# Patient Record
Sex: Female | Born: 1960
Health system: Southern US, Community
[De-identification: ages and names within clinical notes are randomized; demographics above are authoritative.]

## PROBLEM LIST (undated history)

## (undated) DIAGNOSIS — G510 Bell's palsy: Secondary | ICD-10-CM

## (undated) DIAGNOSIS — I2699 Other pulmonary embolism without acute cor pulmonale: Secondary | ICD-10-CM

## (undated) DIAGNOSIS — I82409 Acute embolism and thrombosis of unspecified deep veins of unspecified lower extremity: Secondary | ICD-10-CM

## (undated) DIAGNOSIS — Z8659 Personal history of other mental and behavioral disorders: Secondary | ICD-10-CM

## (undated) DIAGNOSIS — H3500 Unspecified background retinopathy: Secondary | ICD-10-CM

## (undated) DIAGNOSIS — E785 Hyperlipidemia, unspecified: Secondary | ICD-10-CM

## (undated) DIAGNOSIS — Z992 Dependence on renal dialysis: Secondary | ICD-10-CM

## (undated) DIAGNOSIS — N390 Urinary tract infection, site not specified: Secondary | ICD-10-CM

## (undated) DIAGNOSIS — E119 Type 2 diabetes mellitus without complications: Secondary | ICD-10-CM

## (undated) DIAGNOSIS — N19 Unspecified kidney failure: Secondary | ICD-10-CM

## (undated) DIAGNOSIS — I10 Essential (primary) hypertension: Secondary | ICD-10-CM

## (undated) DIAGNOSIS — D649 Anemia, unspecified: Secondary | ICD-10-CM

## (undated) DIAGNOSIS — M109 Gout, unspecified: Secondary | ICD-10-CM

## (undated) DIAGNOSIS — T148XXA Other injury of unspecified body region, initial encounter: Secondary | ICD-10-CM

## (undated) DIAGNOSIS — Z9289 Personal history of other medical treatment: Secondary | ICD-10-CM

## (undated) DIAGNOSIS — N186 End stage renal disease: Secondary | ICD-10-CM

## (undated) DIAGNOSIS — B9620 Unspecified Escherichia coli [E. coli] as the cause of diseases classified elsewhere: Secondary | ICD-10-CM

## (undated) DIAGNOSIS — N189 Chronic kidney disease, unspecified: Secondary | ICD-10-CM

## (undated) HISTORY — PX: EYE SURGERY: SHX253

## (undated) HISTORY — DX: Unspecified kidney failure: N19

## (undated) HISTORY — PX: NO PAST SURGERIES: SHX2092

---

## 1998-02-09 ENCOUNTER — Inpatient Hospital Stay (HOSPITAL_COMMUNITY): Admission: AD | Admit: 1998-02-09 | Discharge: 1998-02-09 | Payer: Self-pay | Admitting: *Deleted

## 1998-02-15 ENCOUNTER — Inpatient Hospital Stay (HOSPITAL_COMMUNITY): Admission: AD | Admit: 1998-02-15 | Discharge: 1998-02-15 | Payer: Self-pay | Admitting: Obstetrics & Gynecology

## 1998-02-22 ENCOUNTER — Ambulatory Visit (HOSPITAL_COMMUNITY): Admission: RE | Admit: 1998-02-22 | Discharge: 1998-02-22 | Payer: Self-pay | Admitting: Obstetrics

## 1998-03-01 ENCOUNTER — Ambulatory Visit (HOSPITAL_COMMUNITY): Admission: RE | Admit: 1998-03-01 | Discharge: 1998-03-01 | Payer: Self-pay | Admitting: Obstetrics & Gynecology

## 1998-03-15 ENCOUNTER — Ambulatory Visit (HOSPITAL_COMMUNITY): Admission: RE | Admit: 1998-03-15 | Discharge: 1998-03-15 | Payer: Self-pay | Admitting: Obstetrics

## 2003-05-12 ENCOUNTER — Emergency Department (HOSPITAL_COMMUNITY): Admission: EM | Admit: 2003-05-12 | Discharge: 2003-05-12 | Payer: Self-pay | Admitting: Emergency Medicine

## 2003-05-12 ENCOUNTER — Encounter: Payer: Self-pay | Admitting: Emergency Medicine

## 2009-10-01 ENCOUNTER — Ambulatory Visit (HOSPITAL_COMMUNITY): Admission: RE | Admit: 2009-10-01 | Discharge: 2009-10-01 | Payer: Self-pay | Admitting: Orthopedic Surgery

## 2010-12-30 ENCOUNTER — Encounter: Payer: Self-pay | Admitting: Family Medicine

## 2011-03-13 LAB — GLUCOSE, CAPILLARY: Glucose-Capillary: 322 mg/dL — ABNORMAL HIGH (ref 70–99)

## 2013-09-21 ENCOUNTER — Encounter (HOSPITAL_BASED_OUTPATIENT_CLINIC_OR_DEPARTMENT_OTHER): Payer: Self-pay | Admitting: Emergency Medicine

## 2013-09-21 ENCOUNTER — Emergency Department (HOSPITAL_BASED_OUTPATIENT_CLINIC_OR_DEPARTMENT_OTHER): Payer: Federal, State, Local not specified - PPO

## 2013-09-21 ENCOUNTER — Inpatient Hospital Stay (HOSPITAL_BASED_OUTPATIENT_CLINIC_OR_DEPARTMENT_OTHER)
Admission: EM | Admit: 2013-09-21 | Discharge: 2013-10-09 | DRG: 870 | Disposition: A | Discharge status: Home / Self Care | Payer: Federal, State, Local not specified - PPO | Attending: Internal Medicine | Admitting: Internal Medicine

## 2013-09-21 DIAGNOSIS — IMO0002 Reserved for concepts with insufficient information to code with codable children: Secondary | ICD-10-CM | POA: Diagnosis present

## 2013-09-21 DIAGNOSIS — I5023 Acute on chronic systolic (congestive) heart failure: Secondary | ICD-10-CM | POA: Diagnosis present

## 2013-09-21 DIAGNOSIS — R0989 Other specified symptoms and signs involving the circulatory and respiratory systems: Secondary | ICD-10-CM | POA: Diagnosis present

## 2013-09-21 DIAGNOSIS — I10 Essential (primary) hypertension: Secondary | ICD-10-CM | POA: Diagnosis not present

## 2013-09-21 DIAGNOSIS — I5021 Acute systolic (congestive) heart failure: Secondary | ICD-10-CM

## 2013-09-21 DIAGNOSIS — F29 Unspecified psychosis not due to a substance or known physiological condition: Secondary | ICD-10-CM | POA: Diagnosis present

## 2013-09-21 DIAGNOSIS — N289 Disorder of kidney and ureter, unspecified: Secondary | ICD-10-CM | POA: Diagnosis present

## 2013-09-21 DIAGNOSIS — A419 Sepsis, unspecified organism: Secondary | ICD-10-CM | POA: Diagnosis present

## 2013-09-21 DIAGNOSIS — R1110 Vomiting, unspecified: Secondary | ICD-10-CM

## 2013-09-21 DIAGNOSIS — D6959 Other secondary thrombocytopenia: Secondary | ICD-10-CM | POA: Diagnosis present

## 2013-09-21 DIAGNOSIS — I420 Dilated cardiomyopathy: Secondary | ICD-10-CM | POA: Diagnosis present

## 2013-09-21 DIAGNOSIS — J80 Acute respiratory distress syndrome: Secondary | ICD-10-CM | POA: Diagnosis present

## 2013-09-21 DIAGNOSIS — B9620 Unspecified Escherichia coli [E. coli] as the cause of diseases classified elsewhere: Secondary | ICD-10-CM | POA: Diagnosis present

## 2013-09-21 DIAGNOSIS — E870 Hyperosmolality and hypernatremia: Secondary | ICD-10-CM | POA: Diagnosis not present

## 2013-09-21 DIAGNOSIS — G9341 Metabolic encephalopathy: Secondary | ICD-10-CM | POA: Diagnosis not present

## 2013-09-21 DIAGNOSIS — N390 Urinary tract infection, site not specified: Secondary | ICD-10-CM | POA: Diagnosis present

## 2013-09-21 DIAGNOSIS — E871 Hypo-osmolality and hyponatremia: Secondary | ICD-10-CM | POA: Diagnosis present

## 2013-09-21 DIAGNOSIS — G9340 Encephalopathy, unspecified: Secondary | ICD-10-CM | POA: Diagnosis present

## 2013-09-21 DIAGNOSIS — A4151 Sepsis due to Escherichia coli [E. coli]: Principal | ICD-10-CM | POA: Diagnosis present

## 2013-09-21 DIAGNOSIS — J9589 Other postprocedural complications and disorders of respiratory system, not elsewhere classified: Secondary | ICD-10-CM | POA: Diagnosis present

## 2013-09-21 DIAGNOSIS — N170 Acute kidney failure with tubular necrosis: Secondary | ICD-10-CM | POA: Diagnosis present

## 2013-09-21 DIAGNOSIS — R197 Diarrhea, unspecified: Secondary | ICD-10-CM | POA: Diagnosis present

## 2013-09-21 DIAGNOSIS — E1110 Type 2 diabetes mellitus with ketoacidosis without coma: Secondary | ICD-10-CM

## 2013-09-21 DIAGNOSIS — D638 Anemia in other chronic diseases classified elsewhere: Secondary | ICD-10-CM | POA: Diagnosis present

## 2013-09-21 DIAGNOSIS — R7881 Bacteremia: Secondary | ICD-10-CM

## 2013-09-21 DIAGNOSIS — E1310 Other specified diabetes mellitus with ketoacidosis without coma: Secondary | ICD-10-CM | POA: Diagnosis present

## 2013-09-21 DIAGNOSIS — E861 Hypovolemia: Secondary | ICD-10-CM | POA: Diagnosis present

## 2013-09-21 DIAGNOSIS — E1165 Type 2 diabetes mellitus with hyperglycemia: Secondary | ICD-10-CM | POA: Diagnosis present

## 2013-09-21 DIAGNOSIS — J9601 Acute respiratory failure with hypoxia: Secondary | ICD-10-CM | POA: Diagnosis present

## 2013-09-21 DIAGNOSIS — I428 Other cardiomyopathies: Secondary | ICD-10-CM | POA: Diagnosis present

## 2013-09-21 DIAGNOSIS — I2699 Other pulmonary embolism without acute cor pulmonale: Secondary | ICD-10-CM | POA: Diagnosis not present

## 2013-09-21 DIAGNOSIS — E873 Alkalosis: Secondary | ICD-10-CM | POA: Diagnosis not present

## 2013-09-21 DIAGNOSIS — I509 Heart failure, unspecified: Secondary | ICD-10-CM | POA: Diagnosis present

## 2013-09-21 DIAGNOSIS — E876 Hypokalemia: Secondary | ICD-10-CM | POA: Diagnosis not present

## 2013-09-21 DIAGNOSIS — R570 Cardiogenic shock: Secondary | ICD-10-CM | POA: Diagnosis present

## 2013-09-21 DIAGNOSIS — R29810 Facial weakness: Secondary | ICD-10-CM | POA: Diagnosis present

## 2013-09-21 DIAGNOSIS — D65 Disseminated intravascular coagulation [defibrination syndrome]: Secondary | ICD-10-CM | POA: Diagnosis present

## 2013-09-21 HISTORY — DX: Other injury of unspecified body region, initial encounter: T14.8XXA

## 2013-09-21 HISTORY — DX: Type 2 diabetes mellitus without complications: E11.9

## 2013-09-21 LAB — COMPREHENSIVE METABOLIC PANEL
ALT: 8 U/L (ref 0–35)
AST: 15 U/L (ref 0–37)
Albumin: 2.8 g/dL — ABNORMAL LOW (ref 3.5–5.2)
Alkaline Phosphatase: 173 U/L — ABNORMAL HIGH (ref 39–117)
BUN: 22 mg/dL (ref 6–23)
CO2: 20 mEq/L (ref 19–32)
Calcium: 8.9 mg/dL (ref 8.4–10.5)
Chloride: 86 mEq/L — ABNORMAL LOW (ref 96–112)
Creatinine, Ser: 2.2 mg/dL — ABNORMAL HIGH (ref 0.50–1.10)
GFR calc Af Amer: 29 mL/min — ABNORMAL LOW (ref >90)
GFR calc non Af Amer: 25 mL/min — ABNORMAL LOW (ref >90)
Glucose, Bld: 806 mg/dL (ref 70–99)
Potassium: 3.7 mEq/L (ref 3.5–5.1)
Sodium: 124 mEq/L — ABNORMAL LOW (ref 135–145)
Total Bilirubin: 0.3 mg/dL (ref 0.3–1.2)
Total Protein: 7.2 g/dL (ref 6.0–8.3)

## 2013-09-21 LAB — POCT I-STAT 3, VENOUS BLOOD GAS (G3P V)
Acid-base deficit: 3 mmol/L — ABNORMAL HIGH (ref 0.0–2.0)
Bicarbonate: 20.6 mEq/L (ref 20.0–24.0)
O2 Saturation: 64 %
Patient temperature: 100.1
TCO2: 22 mmol/L (ref 0–100)
pCO2, Ven: 35.3 mmHg — ABNORMAL LOW (ref 45.0–50.0)
pH, Ven: 7.379 — ABNORMAL HIGH (ref 7.250–7.300)
pO2, Ven: 35 mmHg (ref 30.0–45.0)

## 2013-09-21 LAB — CBC WITH DIFFERENTIAL/PLATELET
Basophils Absolute: 0 10*3/uL (ref 0.0–0.1)
Basophils Relative: 0 % (ref 0–1)
Eosinophils Absolute: 0 10*3/uL (ref 0.0–0.7)
Eosinophils Relative: 0 % (ref 0–5)
HCT: 35.6 % — ABNORMAL LOW (ref 36.0–46.0)
Hemoglobin: 12.1 g/dL (ref 12.0–15.0)
Lymphocytes Relative: 3 % — ABNORMAL LOW (ref 12–46)
Lymphs Abs: 0.5 10*3/uL — ABNORMAL LOW (ref 0.7–4.0)
MCH: 24.2 pg — ABNORMAL LOW (ref 26.0–34.0)
MCHC: 34 g/dL (ref 30.0–36.0)
MCV: 71.1 fL — ABNORMAL LOW (ref 78.0–100.0)
Monocytes Absolute: 1 10*3/uL (ref 0.1–1.0)
Monocytes Relative: 6 % (ref 3–12)
Neutro Abs: 15.4 10*3/uL — ABNORMAL HIGH (ref 1.7–7.7)
Neutrophils Relative %: 91 % — ABNORMAL HIGH (ref 43–77)
Platelets: 152 10*3/uL (ref 150–400)
RBC: 5.01 MIL/uL (ref 3.87–5.11)
RDW: 15.4 % (ref 11.5–15.5)
WBC Morphology: INCREASED
WBC: 16.9 10*3/uL — ABNORMAL HIGH (ref 4.0–10.5)

## 2013-09-21 LAB — GLUCOSE, CAPILLARY: Glucose-Capillary: >600 mg/dL (ref 70–99)

## 2013-09-21 LAB — CG4 I-STAT (LACTIC ACID): Lactic Acid, Venous: 4.68 mmol/L — ABNORMAL HIGH (ref 0.5–2.2)

## 2013-09-21 MED ORDER — SODIUM CHLORIDE 0.9 % IV SOLN
INTRAVENOUS | Status: DC
  Administered 2013-09-22: 01:00:00 via INTRAVENOUS

## 2013-09-21 MED ORDER — INSULIN REGULAR HUMAN 100 UNIT/ML IJ SOLN
INTRAMUSCULAR | Status: AC
  Filled 2013-09-21: qty 1

## 2013-09-21 MED ORDER — ACETAMINOPHEN 325 MG PO TABS
975.0000 mg | ORAL_TABLET | Freq: Once | ORAL | Status: AC
  Administered 2013-09-21: 975 mg via ORAL

## 2013-09-21 MED ORDER — ACETAMINOPHEN 325 MG PO TABS
ORAL_TABLET | ORAL | Status: AC
  Filled 2013-09-21: qty 3

## 2013-09-21 MED ORDER — SODIUM CHLORIDE 0.9 % IV BOLUS (SEPSIS)
2000.0000 mL | Freq: Once | INTRAVENOUS | Status: AC
  Administered 2013-09-22: 2000 mL via INTRAVENOUS

## 2013-09-21 MED ORDER — ONDANSETRON HCL 4 MG/2ML IJ SOLN
4.0000 mg | Freq: Once | INTRAMUSCULAR | Status: AC
  Administered 2013-09-21: 4 mg via INTRAVENOUS
  Filled 2013-09-21: qty 2

## 2013-09-21 MED ORDER — SODIUM CHLORIDE 0.9 % IV SOLN
INTRAVENOUS | Status: DC
  Administered 2013-09-22: 5.4 [IU]/h via INTRAVENOUS

## 2013-09-21 MED ORDER — SODIUM CHLORIDE 0.9 % IV BOLUS (SEPSIS)
2000.0000 mL | Freq: Once | INTRAVENOUS | Status: AC
  Administered 2013-09-21: 2000 mL via INTRAVENOUS

## 2013-09-21 NOTE — ED Notes (Signed)
Pt c/o weakness, vomiting and diarrhea yesterday. Pt states "I just dont feel good".

## 2013-09-21 NOTE — ED Notes (Signed)
MD at bedside. 

## 2013-09-21 NOTE — ED Provider Notes (Signed)
CSN: [id number]     Arrival date & time 09/21/13  2232 History  This chart was scribed for Colleen Relic, MD by Marlowe Kays, ED Scribe. This patient was seen in room MH06/MH06 and the patient's care was started at 11:22 PM.  Chief Complaint  Patient presents with  . Weakness  . Emesis   The history is provided by the patient. No language interpreter was used.   HPI Comments:  ERICE [name] is a 52 y.o. female with h/o DM type II who presents to the Emergency Department complaining of a few episodes per day of nonbloody vomiting and diarrhea with associated chills, fever and general weakness onset 2 days. She states she does not take any medication although she should be on Metformin and states she does not check her CBGs. Pt denies any confusion, LOC,pain, cough, SOB, trauma, rash, or dysuria. She denies any h/o asthma, MI, cancer, or seizures. She also denies any recent travel. She states she does not a primary care physician. There is no treatment PTA. She has 2 days also of polyuria despite a dry mouth and excessive thirst.   Past Medical History  Diagnosis Date  . Diabetes   . 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 No pertinent past surgical history. History reviewed. No pertinent family history. History  Substance Use Topics  . Smoking status: Never Smoker   . Smokeless tobacco: Not on file  . Alcohol Use: No   OB History   Grav Para Term Preterm Abortions TAB SAB Ect Mult Living                 Review of Systems 10 Systems reviewed and all are negative for acute change except as noted in the HPI.   Allergies  Review of patient's allergies indicates no known allergies.  Home Medications  No current outpatient prescriptions on file. Triage Vitals: BP 87/55  Pulse 131  Temp(Src) 100.1 F (37.8 C) (Oral)  Resp 16  Ht 5\' 4"  (1.626 m)  Wt 163 lb (73.936 kg)  BMI 27.97 kg/m2  SpO2 97%  LMP  09/14/2013 Physical Exam  Nursing note and vitals reviewed. Constitutional: She is oriented to person, place, and time.  Awake, alert, nontoxic appearance.  HENT:  Head: Atraumatic.  Dry oral mucosa  Eyes: Right eye exhibits no discharge. Left eye exhibits no discharge.  Neck: Neck supple.  Cardiovascular: Regular rhythm.   No murmur heard. tachycardic  Pulmonary/Chest: Effort normal and breath sounds normal. No respiratory distress. She has no wheezes. She has no rales. She exhibits no tenderness.  Abdominal: Soft. Bowel sounds are normal. She exhibits no distension and no mass. There is no tenderness. There is no rebound and no guarding.  Musculoskeletal: She exhibits no edema and no tenderness.  Baseline ROM, no obvious new focal weakness.  Neurological: She is alert and oriented to person, place, and time.  Mental status and motor strength appears baseline for patient and situation.  Skin: No rash noted.  Psychiatric: She has a normal mood and affect.    ED Course  Procedures (including critical care time) DIAGNOSTIC STUDIES: Oxygen Saturation is 97% on RA, normal by my interpretation.   Patient / Family / Caregiver understand and agree with initial ED impression and plan with expectations set for ED visit.  Pt stable in ED with no significant deterioration in condition.Pt feels improved after observation and/or  treatment in ED.Patient / Family / Caregiver informed of clinical course, understand medical decision-making process, and agree with plan.  D/w [facility] for transfer; Pt agrees. 0223  When [facility] arrived for transfer Pt dropped SBP to 60's, Pt still dry mouth but improved pulse rate to 107, repeat IVF bolus (6th liter) and started Levophed as well as d/w [facility] who will admit to ICU instead of [facility]. Patient / Family / Caregiver informed of clinical course, understand medical decision-making process, and agree with plan. 0410  Pt denied dyspnea but  became hypoxic with O2 sats in 80's responded to O2 mask; re-exam lungs now shows crackles bilat ~1/3 way up so fluids rate decreased and Levophed titrated upwards, SBP improving to high 70's; pulse rate 104, O2 sats in 90's on O2 mask, ICU bed now available; Pt remains awake alert calm and cooperative sitting up without resp distress. [id number]   Discussed airway management with [facility] transfer crew and do not feel Pt required intubation pre-transfer.   CRITICAL CARE Performed by: Colleen [name] Total critical care time: 80min for dka with dehydration and possible sepsis with hypotension, IVF, insulin drip; hypotension responded to IVF bolus initially then required vasopressor. Critical care time was exclusive of separately billable procedures and treating other patients. Critical care was necessary to treat or prevent imminent or life-threatening deterioration. Critical care was time spent personally by me on the following activities: development of treatment plan with patient and/or surrogate as well as nursing, discussions with consultants, evaluation of patient's response to treatment, examination of patient, obtaining history from patient or surrogate, ordering and performing treatments and interventions, ordering and review of laboratory studies, ordering and review of radiographic studies, pulse oximetry and re-evaluation of patient's condition.  Medications  insulin regular (NOVOLIN R,HUMULIN R) 1 Units/mL in sodium chloride 0.9 % 100 mL infusion (2.4 Units/hr Intravenous Rate/Dose Change 09/22/13 0412)  0.9 %  sodium chloride infusion ( Intravenous Rate/Dose Change 09/22/13 0431)  norepinephrine (LEVOPHED) 4 mg in dextrose 5 % 250 mL infusion (20 mcg/min Intravenous Rate/Dose Change 09/22/13 0446)  sodium chloride 0.9 % bolus 2,000 mL (0 mLs Intravenous Stopped 09/22/13 0023)  insulin regular (NOVOLIN R,HUMULIN R) 100 units/mL injection (  Duplicate [id number])  acetaminophen (TYLENOL)  tablet 975 mg (975 mg Oral Given 09/21/13 2331)  sodium chloride 0.9 % bolus 2,000 mL (0 mLs Intravenous Stopped 09/22/13 0110)  ondansetron (ZOFRAN) injection 4 mg (4 mg Intravenous Given 09/21/13 2358)  ibuprofen (ADVIL,MOTRIN) tablet 400 mg (400 mg Oral Given 09/22/13 0100)  cefTRIAXone (ROCEPHIN) 2 g in dextrose 5 % 50 mL IVPB (0 g Intravenous Stopped 09/22/13 0148)  sodium chloride 0.9 % bolus 1,000 mL (0 mLs Intravenous Stopped 09/22/13 0206)  cefTRIAXone (ROCEPHIN) 2 G injection (  Duplicate [id number])  sodium chloride 0.9 % bolus 1,000 mL (1,000 mLs Intravenous Rate/Dose Change 09/22/13 0430)    Labs Review Labs Reviewed  GLUCOSE, CAPILLARY - Abnormal; Notable for the following:    Glucose-Capillary >600 (*)    All other components within normal limits  CBC WITH DIFFERENTIAL - Abnormal; Notable for the following:    WBC 16.9 (*)    HCT 35.6 (*)    MCV 71.1 (*)    MCH 24.2 (*)    Neutrophils Relative % 91 (*)    Lymphocytes Relative 3 (*)    Neutro Abs 15.4 (*)    Lymphs Abs 0.5 (*)    All other components within normal limits  URINALYSIS, ROUTINE  W REFLEX MICROSCOPIC - Abnormal; Notable for the following:    APPearance CLOUDY (*)    Glucose, UA >1000 (*)    Hgb urine dipstick TRACE (*)    Ketones, ur 15 (*)    All other components within normal limits  COMPREHENSIVE METABOLIC PANEL - Abnormal; Notable for the following:    Sodium 124 (*)    Chloride 86 (*)    Glucose, Bld 806 (*)    Creatinine, Ser 2.20 (*)    Albumin 2.8 (*)    Alkaline Phosphatase 173 (*)    GFR calc non Af Amer 25 (*)    GFR calc Af Amer 29 (*)    All other components within normal limits  URINE MICROSCOPIC-ADD ON - Abnormal; Notable for the following:    Bacteria, UA MANY (*)    All other components within normal limits  GLUCOSE, CAPILLARY - Abnormal; Notable for the following:    Glucose-Capillary 518 (*)    All other components within normal limits  GLUCOSE, CAPILLARY - Abnormal;  Notable for the following:    Glucose-Capillary 431 (*)    All other components within normal limits  GLUCOSE, CAPILLARY - Abnormal; Notable for the following:    Glucose-Capillary 295 (*)    All other components within normal limits  CG4 I-STAT (LACTIC ACID) - Abnormal; Notable for the following:    Lactic Acid, Venous 4.68 (*)    All other components within normal limits  POCT I-STAT 3, BLOOD GAS (G3P V) - Abnormal; Notable for the following:    pH, Ven 7.379 (*)    pCO2, Ven 35.3 (*)    Acid-base deficit 3.0 (*)    All other components within normal limits  CULTURE, BLOOD (ROUTINE X 2)  CULTURE, BLOOD (ROUTINE X 2)  URINE CULTURE  PREGNANCY, URINE  BLOOD GAS, VENOUS   Imaging Review Dg Chest Port 1 View  09/22/2013   CLINICAL DATA:  Emesis and diarrhea for 1 week, with chills and fever.  EXAM: PORTABLE CHEST - 1 VIEW  COMPARISON:  None.  FINDINGS: Cardiomegaly. Low lung volumes. Moderate vascular congestion. No overt failure or areas of lobar consolidation. No effusion or pneumothorax. Negative osseous structures. No visible free air on this portable semi erect radiograph.  IMPRESSION: Cardiomegaly with low lung volumes. No definite active infiltrates or failure.   Electronically Signed   By: Rolla Flatten M.D.   On: 09/22/2013 01:34    EKG Interpretation   None       MDM   1. DKA (diabetic ketoacidoses)   2. Sepsis   3. UTI (urinary tract infection)   4. Vomiting and diarrhea    The patient appears reasonably stabilized for transfer considering the current resources, flow, and capabilities available in the ED at this time, and I doubt any other [facility] requiring further screening and/or treatment in the ED prior to transfer. I personally performed the services described in this documentation, which was scribed in my presence. The recorded information has been reviewed and is accurate.     Colleen Relic, MD 09/22/13 [phone]

## 2013-09-22 ENCOUNTER — Inpatient Hospital Stay (HOSPITAL_COMMUNITY): Payer: Federal, State, Local not specified - PPO

## 2013-09-22 DIAGNOSIS — R197 Diarrhea, unspecified: Secondary | ICD-10-CM

## 2013-09-22 DIAGNOSIS — R1110 Vomiting, unspecified: Secondary | ICD-10-CM

## 2013-09-22 DIAGNOSIS — I519 Heart disease, unspecified: Secondary | ICD-10-CM

## 2013-09-22 LAB — BASIC METABOLIC PANEL
BUN: 21 mg/dL (ref 6–23)
BUN: 23 mg/dL (ref 6–23)
BUN: 24 mg/dL — ABNORMAL HIGH (ref 6–23)
BUN: 25 mg/dL — ABNORMAL HIGH (ref 6–23)
BUN: 25 mg/dL — ABNORMAL HIGH (ref 6–23)
CO2: 15 mEq/L — ABNORMAL LOW (ref 19–32)
CO2: 13 mEq/L — ABNORMAL LOW (ref 19–32)
CO2: 10 mEq/L — CL (ref 19–32)
CO2: 11 mEq/L — ABNORMAL LOW (ref 19–32)
CO2: 12 mEq/L — ABNORMAL LOW (ref 19–32)
Calcium: 6.8 mg/dL — ABNORMAL LOW (ref 8.4–10.5)
Calcium: 6.7 mg/dL — ABNORMAL LOW (ref 8.4–10.5)
Calcium: 6.8 mg/dL — ABNORMAL LOW (ref 8.4–10.5)
Calcium: 6.9 mg/dL — ABNORMAL LOW (ref 8.4–10.5)
Calcium: 6.8 mg/dL — ABNORMAL LOW (ref 8.4–10.5)
Chloride: 104 mEq/L (ref 96–112)
Chloride: 103 mEq/L (ref 96–112)
Chloride: 103 mEq/L (ref 96–112)
Chloride: 107 mEq/L (ref 96–112)
Chloride: 110 mEq/L (ref 96–112)
Creatinine, Ser: 2.43 mg/dL — ABNORMAL HIGH (ref 0.50–1.10)
Creatinine, Ser: 2.58 mg/dL — ABNORMAL HIGH (ref 0.50–1.10)
Creatinine, Ser: 2.73 mg/dL — ABNORMAL HIGH (ref 0.50–1.10)
Creatinine, Ser: 2.68 mg/dL — ABNORMAL HIGH (ref 0.50–1.10)
Creatinine, Ser: 2.64 mg/dL — ABNORMAL HIGH (ref 0.50–1.10)
GFR calc Af Amer: 25 mL/min — ABNORMAL LOW (ref >90)
GFR calc Af Amer: 24 mL/min — ABNORMAL LOW (ref >90)
GFR calc Af Amer: 22 mL/min — ABNORMAL LOW (ref >90)
GFR calc Af Amer: 23 mL/min — ABNORMAL LOW (ref >90)
GFR calc Af Amer: 23 mL/min — ABNORMAL LOW (ref >90)
GFR calc non Af Amer: 22 mL/min — ABNORMAL LOW (ref >90)
GFR calc non Af Amer: 20 mL/min — ABNORMAL LOW (ref >90)
GFR calc non Af Amer: 19 mL/min — ABNORMAL LOW (ref >90)
GFR calc non Af Amer: 19 mL/min — ABNORMAL LOW (ref >90)
GFR calc non Af Amer: 20 mL/min — ABNORMAL LOW (ref >90)
Glucose, Bld: 328 mg/dL — ABNORMAL HIGH (ref 70–99)
Glucose, Bld: 409 mg/dL — ABNORMAL HIGH (ref 70–99)
Glucose, Bld: 322 mg/dL — ABNORMAL HIGH (ref 70–99)
Glucose, Bld: 151 mg/dL — ABNORMAL HIGH (ref 70–99)
Glucose, Bld: 167 mg/dL — ABNORMAL HIGH (ref 70–99)
Potassium: 2.5 mEq/L — CL (ref 3.5–5.1)
Potassium: 3.3 mEq/L — ABNORMAL LOW (ref 3.5–5.1)
Potassium: 3.4 mEq/L — ABNORMAL LOW (ref 3.5–5.1)
Potassium: 3.4 mEq/L — ABNORMAL LOW (ref 3.5–5.1)
Potassium: 4.4 mEq/L (ref 3.5–5.1)
Sodium: 136 mEq/L (ref 135–145)
Sodium: 132 mEq/L — ABNORMAL LOW (ref 135–145)
Sodium: 131 mEq/L — ABNORMAL LOW (ref 135–145)
Sodium: 134 mEq/L — ABNORMAL LOW (ref 135–145)
Sodium: 136 mEq/L (ref 135–145)

## 2013-09-22 LAB — GLUCOSE, CAPILLARY
Glucose-Capillary: 518 mg/dL — ABNORMAL HIGH (ref 70–99)
Glucose-Capillary: 431 mg/dL — ABNORMAL HIGH (ref 70–99)
Glucose-Capillary: 295 mg/dL — ABNORMAL HIGH (ref 70–99)
Glucose-Capillary: 276 mg/dL — ABNORMAL HIGH (ref 70–99)
Glucose-Capillary: 309 mg/dL — ABNORMAL HIGH (ref 70–99)
Glucose-Capillary: 358 mg/dL — ABNORMAL HIGH (ref 70–99)
Glucose-Capillary: 365 mg/dL — ABNORMAL HIGH (ref 70–99)
Glucose-Capillary: 320 mg/dL — ABNORMAL HIGH (ref 70–99)
Glucose-Capillary: 291 mg/dL — ABNORMAL HIGH (ref 70–99)
Glucose-Capillary: 253 mg/dL — ABNORMAL HIGH (ref 70–99)
Glucose-Capillary: 176 mg/dL — ABNORMAL HIGH (ref 70–99)
Glucose-Capillary: 156 mg/dL — ABNORMAL HIGH (ref 70–99)
Glucose-Capillary: 145 mg/dL — ABNORMAL HIGH (ref 70–99)
Glucose-Capillary: 116 mg/dL — ABNORMAL HIGH (ref 70–99)
Glucose-Capillary: 113 mg/dL — ABNORMAL HIGH (ref 70–99)

## 2013-09-22 LAB — PROTIME-INR
INR: 1.61 — ABNORMAL HIGH (ref 0.00–1.49)
Prothrombin Time: 18.7 seconds — ABNORMAL HIGH (ref 11.6–15.2)

## 2013-09-22 LAB — PROCALCITONIN: Procalcitonin: 122.89 ng/mL

## 2013-09-22 LAB — CARBOXYHEMOGLOBIN
Carboxyhemoglobin: 1.1 % (ref 0.5–1.5)
Methemoglobin: 1.5 % (ref 0.0–1.5)
O2 Saturation: 60.7 %
Total hemoglobin: 10.5 g/dL — ABNORMAL LOW (ref 12.0–16.0)

## 2013-09-22 LAB — FIBRINOGEN: Fibrinogen: 542 mg/dL — ABNORMAL HIGH (ref 204–475)

## 2013-09-22 LAB — CBC
HCT: 32.4 % — ABNORMAL LOW (ref 36.0–46.0)
Hemoglobin: 11.1 g/dL — ABNORMAL LOW (ref 12.0–15.0)
MCH: 24.1 pg — ABNORMAL LOW (ref 26.0–34.0)
MCHC: 34.3 g/dL (ref 30.0–36.0)
MCV: 70.4 fL — ABNORMAL LOW (ref 78.0–100.0)
Platelets: 95 10*3/uL — ABNORMAL LOW (ref 150–400)
RBC: 4.6 MIL/uL (ref 3.87–5.11)
RDW: 15.1 % (ref 11.5–15.5)
WBC: 17.4 10*3/uL — ABNORMAL HIGH (ref 4.0–10.5)

## 2013-09-22 LAB — URINE MICROSCOPIC-ADD ON

## 2013-09-22 LAB — URINALYSIS, ROUTINE W REFLEX MICROSCOPIC
Bilirubin Urine: NEGATIVE
Bilirubin Urine: NEGATIVE
Glucose, UA: >1000 mg/dL — AB
Glucose, UA: >1000 mg/dL — AB
Ketones, ur: 15 mg/dL — AB
Ketones, ur: 15 mg/dL — AB
Leukocytes, UA: NEGATIVE
Leukocytes, UA: NEGATIVE
Nitrite: NEGATIVE
Nitrite: NEGATIVE
Protein, ur: NEGATIVE mg/dL
Protein, ur: NEGATIVE mg/dL
Specific Gravity, Urine: 1.024 (ref 1.005–1.030)
Specific Gravity, Urine: 1.024 (ref 1.005–1.030)
Urobilinogen, UA: 0.2 mg/dL (ref 0.0–1.0)
Urobilinogen, UA: 0.2 mg/dL (ref 0.0–1.0)
pH: 5 (ref 5.0–8.0)
pH: 5.5 (ref 5.0–8.0)

## 2013-09-22 LAB — TROPONIN I: Troponin I: <0.3 ng/mL (ref <0.30)

## 2013-09-22 LAB — PREGNANCY, URINE: Preg Test, Ur: NEGATIVE

## 2013-09-22 LAB — APTT: aPTT: 38 seconds — ABNORMAL HIGH (ref 24–37)

## 2013-09-22 LAB — TYPE AND SCREEN
ABO/RH(D): O POS
Antibody Screen: NEGATIVE

## 2013-09-22 LAB — OSMOLALITY: Osmolality: 299 mOsm/kg (ref 275–300)

## 2013-09-22 LAB — CORTISOL: Cortisol, Plasma: 19.4 ug/dL

## 2013-09-22 LAB — CLOSTRIDIUM DIFFICILE BY PCR: Toxigenic C. Difficile by PCR: NEGATIVE

## 2013-09-22 LAB — KETONES, QUALITATIVE: Acetone, Bld: NEGATIVE

## 2013-09-22 LAB — LACTIC ACID, PLASMA: Lactic Acid, Venous: 3.3 mmol/L — ABNORMAL HIGH (ref 0.5–2.2)

## 2013-09-22 LAB — ABO/RH: ABO/RH(D): O POS

## 2013-09-22 LAB — MRSA PCR SCREENING: MRSA by PCR: NEGATIVE

## 2013-09-22 MED ORDER — PANTOPRAZOLE SODIUM 40 MG IV SOLR
40.0000 mg | INTRAVENOUS | Status: DC
  Administered 2013-09-22 – 2013-09-24 (×3): 40 mg via INTRAVENOUS
  Filled 2013-09-22 (×5): qty 40

## 2013-09-22 MED ORDER — POTASSIUM CHLORIDE 10 MEQ/50ML IV SOLN
10.0000 meq | INTRAVENOUS | Status: DC
  Administered 2013-09-22 (×3): 10 meq via INTRAVENOUS
  Filled 2013-09-22: qty 50

## 2013-09-22 MED ORDER — NOREPINEPHRINE BITARTRATE 1 MG/ML IJ SOLN
2.0000 ug/min | INTRAVENOUS | Status: DC
  Administered 2013-09-22: 4 ug/min via INTRAVENOUS
  Filled 2013-09-22: qty 4

## 2013-09-22 MED ORDER — VANCOMYCIN HCL IN DEXTROSE 1-5 GM/200ML-% IV SOLN
1000.0000 mg | INTRAVENOUS | Status: DC
  Administered 2013-09-23: 1000 mg via INTRAVENOUS
  Filled 2013-09-22: qty 200

## 2013-09-22 MED ORDER — POTASSIUM CHLORIDE 10 MEQ/100ML IV SOLN
10.0000 meq | INTRAVENOUS | Status: AC
  Administered 2013-09-22: 10 meq via INTRAVENOUS
  Filled 2013-09-22 (×2): qty 100

## 2013-09-22 MED ORDER — VASOPRESSIN 20 UNIT/ML IJ SOLN
0.0300 [IU]/min | INTRAVENOUS | Status: DC
  Administered 2013-09-22: 0.03 [IU]/min via INTRAVENOUS
  Filled 2013-09-22 (×2): qty 2.5

## 2013-09-22 MED ORDER — CHLORHEXIDINE GLUCONATE 0.12 % MT SOLN
15.0000 mL | Freq: Two times a day (BID) | OROMUCOSAL | Status: DC
  Administered 2013-09-22 – 2013-09-24 (×5): 15 mL via OROMUCOSAL
  Filled 2013-09-22 (×4): qty 15

## 2013-09-22 MED ORDER — SODIUM CHLORIDE 0.9 % IV SOLN
INTRAVENOUS | Status: DC
  Administered 2013-09-22: 09:00:00 via INTRAVENOUS

## 2013-09-22 MED ORDER — SODIUM CHLORIDE 0.9 % IV SOLN: INTRAVENOUS | Status: DC

## 2013-09-22 MED ORDER — SODIUM CHLORIDE 0.9 % IV BOLUS (SEPSIS): 1000.0000 mL | Freq: Once | INTRAVENOUS | Status: DC

## 2013-09-22 MED ORDER — POTASSIUM CHLORIDE CRYS ER 20 MEQ PO TBCR
40.0000 meq | EXTENDED_RELEASE_TABLET | ORAL | Status: AC
  Administered 2013-09-22 (×2): 40 meq via ORAL
  Filled 2013-09-22 (×2): qty 2

## 2013-09-22 MED ORDER — SODIUM CHLORIDE 0.9 % IV SOLN
INTRAVENOUS | Status: DC
  Administered 2013-09-22: 2.4 [IU]/h via INTRAVENOUS
  Administered 2013-09-23: 3.9 [IU]/h via INTRAVENOUS
  Filled 2013-09-22 (×4): qty 1

## 2013-09-22 MED ORDER — SODIUM CHLORIDE 0.9 % IV BOLUS (SEPSIS): 1000.0000 mL | INTRAVENOUS | Status: DC | PRN

## 2013-09-22 MED ORDER — BIOTENE DRY MOUTH MT LIQD
15.0000 mL | Freq: Two times a day (BID) | OROMUCOSAL | Status: DC
  Administered 2013-09-22 – 2013-09-24 (×6): 15 mL via OROMUCOSAL

## 2013-09-22 MED ORDER — LIVING WELL WITH DIABETES BOOK
Freq: Once | Status: AC
  Administered 2013-09-22: 13:00:00
  Filled 2013-09-22: qty 1

## 2013-09-22 MED ORDER — IBUPROFEN 400 MG PO TABS
400.0000 mg | ORAL_TABLET | Freq: Once | ORAL | Status: AC
  Administered 2013-09-22: 400 mg via ORAL
  Filled 2013-09-22: qty 1

## 2013-09-22 MED ORDER — CEFTRIAXONE SODIUM 2 G IJ SOLR
2.0000 g | Freq: Once | INTRAMUSCULAR | Status: AC
  Administered 2013-09-22: 2 g via INTRAVENOUS

## 2013-09-22 MED ORDER — NOREPINEPHRINE BITARTRATE 1 MG/ML IJ SOLN
2.0000 ug/min | INTRAVENOUS | Status: DC
  Administered 2013-09-22: 15 ug/min via INTRAVENOUS
  Administered 2013-09-22: 50 ug/min via INTRAVENOUS
  Filled 2013-09-22 (×3): qty 16

## 2013-09-22 MED ORDER — DEXTROSE 50 % IV SOLN: 25.0000 mL | INTRAVENOUS | Status: DC | PRN

## 2013-09-22 MED ORDER — DEXTROSE-NACL 5-0.45 % IV SOLN
INTRAVENOUS | Status: DC
  Administered 2013-09-22 (×2): via INTRAVENOUS

## 2013-09-22 MED ORDER — SODIUM BICARBONATE 8.4 % IV SOLN
INTRAVENOUS | Status: DC
  Administered 2013-09-22 – 2013-09-23 (×2): via INTRAVENOUS
  Filled 2013-09-22 (×6): qty 100

## 2013-09-22 MED ORDER — PIPERACILLIN-TAZOBACTAM 3.375 G IVPB
3.3750 g | Freq: Three times a day (TID) | INTRAVENOUS | Status: DC
  Administered 2013-09-22 – 2013-09-24 (×7): 3.375 g via INTRAVENOUS
  Filled 2013-09-22 (×9): qty 50

## 2013-09-22 MED ORDER — SODIUM CHLORIDE 0.9 % IV BOLUS (SEPSIS)
1000.0000 mL | Freq: Once | INTRAVENOUS | Status: AC
  Administered 2013-09-22: 1000 mL via INTRAVENOUS

## 2013-09-22 MED ORDER — CEFTRIAXONE SODIUM 2 G IJ SOLR
INTRAMUSCULAR | Status: AC
  Filled 2013-09-22: qty 2

## 2013-09-22 MED ORDER — ENOXAPARIN SODIUM 30 MG/0.3ML ~~LOC~~ SOLN
30.0000 mg | SUBCUTANEOUS | Status: DC
  Administered 2013-09-22: 30 mg via SUBCUTANEOUS
  Filled 2013-09-22 (×2): qty 0.3

## 2013-09-22 MED ORDER — VANCOMYCIN HCL 10 G IV SOLR
1250.0000 mg | Freq: Once | INTRAVENOUS | Status: AC
  Administered 2013-09-22: 1250 mg via INTRAVENOUS
  Filled 2013-09-22: qty 1250

## 2013-09-22 MED ORDER — NOREPINEPHRINE BITARTRATE 1 MG/ML IJ SOLN
2.0000 ug/min | INTRAMUSCULAR | Status: DC
  Administered 2013-09-22: 50 ug/min via INTRAVENOUS
  Filled 2013-09-22: qty 8

## 2013-09-22 NOTE — Progress Notes (Signed)
Chaplain encountered pt's mother in hallway and initiated conversation. Pt was unavailable, but chaplain returned 10 minutes later and visited with pt and mother. 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 Mother said, "Work, work, work, work, work" and expressed concern that her daughter works too much. Pt said she "was fine." Chaplain offered emotional/spiritual support and information for chaplain's support. Both were appreciative.   Ethelene Browns, [place]

## 2013-09-22 NOTE — Progress Notes (Signed)
[facility] PCCM  Persistent met acidosis with diarrhea; gap closed  Change fluids to D5 with bicarb gtt  Jillyn Hidden PCCM Pager: [phone] Cell: [phone] If no response, call [phone]

## 2013-09-22 NOTE — Progress Notes (Signed)
  Echocardiogram 2D Echocardiogram has been performed.  Donata Clay 09/22/2013, 4:45 PM

## 2013-09-22 NOTE — ED Notes (Signed)
Attempted to call report to [facility] but the nurse was not available at this time.  She is to call me back.

## 2013-09-22 NOTE — Procedures (Signed)
Central Venous Catheter Insertion Procedure Note Colleen [name] [id number] 1961/01/10  Procedure: Insertion of Central Venous Catheter Indications: Assessment of intravascular volume and Drug and/or fluid administration  Procedure Details Consent: Risks of procedure as well as the alternatives and risks of each were explained to the (patient/caregiver).  Consent for procedure obtained. Time Out: Verified patient identification, verified procedure, site/side was marked, verified correct patient position, special equipment/implants available, medications/allergies/relevent history reviewed, required imaging and test results available.  Performed  Maximum sterile technique was used including antiseptics, cap, gloves, gown, hand hygiene, mask and sheet. Skin prep: Chlorhexidine; local anesthetic administered A antimicrobial bonded/coated triple lumen catheter was placed in the left internal jugular vein using the Seldinger technique.  Ultrasound was used to verify the patency of the vein and for real time needle guidance.  Evaluation Blood flow good Complications: No apparent complications Patient did tolerate procedure well. Chest X-ray ordered to verify placement.  CXR: pending.  MCQUAID, DOUGLAS 09/22/2013, 7:09 AM

## 2013-09-22 NOTE — ED Notes (Signed)
Pt's O2 sat dropped to 87 with encouraged deep breathing. [place] removed and pt placed on NRB at 10LPM. O2 sat increased to 98%.

## 2013-09-22 NOTE — Progress Notes (Addendum)
Dr. Lamonte Sakai notified of pt's gram negative rods in both blood cultures and CO2 of 10.

## 2013-09-22 NOTE — Progress Notes (Signed)
Anion gap,.  NA-136, CL-110 , CO2-12  136-(110+12) = 14

## 2013-09-22 NOTE — Progress Notes (Signed)
Dr. Lamonte Sakai of urine output of 75 ml since foley placement around 0800 this morning and creatinine slowly trending upward. No new orders received. Will continue to monitor.

## 2013-09-22 NOTE — Progress Notes (Signed)
10/16  Spoke with patient about her diabetes.  Was diagnosed about 4-5 years ago.  Does not have a PCP to follow her diabetes. States that she had been sick with nausea and vomiting for the last 2 days before admission.  Has not been checking blood sugars at home.  Dines out all the time for meals.  Has not had outpatient education classes on diabetes.  States that she has [employer]. Recommend that she attend DM outpatient classes if ordered by physician.  Will have staff RNs have patient watch DM videos when feeling better and while in hospital,  will order Living Well with Diabetes booklet from pharmacy, start teaching insulin administration when patient feeling better and if patient going home on insulin and teach patient to do own CBGs.  Will have case management and dietician see patient.  Will continue to follow while in hospital.  Harvel Ricks RN BSN CDE

## 2013-09-22 NOTE — Progress Notes (Signed)
CRITICAL VALUE ALERT  Critical value received:  K 2.5  Date of notification:  09/22/2013  Time of notification:  08:20  Critical value read back: yes  Nurse who received alert:  Adalberto Ill  MD notified (1st page):  Dr. Lamonte Sakai  Time of first page:  08:20  MD notified (2nd page):  Time of second page:  Responding MD:  Dr. Lamonte Sakai  Time MD responded:  08:20

## 2013-09-22 NOTE — Progress Notes (Signed)
Radiology called about 0719 x-ray to confirm central line placement. Dr. Lamonte Sakai notified about results. No new orders received. Will continue to monitor.

## 2013-09-22 NOTE — Progress Notes (Signed)
ANTIBIOTIC CONSULT NOTE - INITIAL  Pharmacy Consult for vancomycin and zosyn Indication: septic shock  No Known Allergies  Patient Measurements: Height: 5\' 4"  (162.6 cm) Weight: 163 lb (73.936 kg) IBW/kg (Calculated) : 54.7   Vital Signs: Temp: 100.9 F (38.3 C) (10/16 0247) Temp src: Rectal (10/16 0247) BP: 85/58 mmHg (10/16 0535) Pulse Rate: 110 (10/16 0535) Intake/Output from previous day:   Intake/Output from this shift:    Labs:  Recent Labs  09/21/13 2250  WBC 16.9*  HGB 12.1  PLT 152  CREATININE 2.20*   Estimated Creatinine Clearance: 29.8 ml/min (by C-G formula based on Cr of 2.2). No results found for this basename: VANCOTROUGH, VANCOPEAK, VANCORANDOM, GENTTROUGH, GENTPEAK, GENTRANDOM, TOBRATROUGH, TOBRAPEAK, TOBRARND, AMIKACINPEAK, AMIKACINTROU, AMIKACIN,  in the last 72 hours   Microbiology: No results found for this or any previous visit (from the past 720 hour(s)).  Medical History: Past Medical History  Diagnosis Date  . Diabetes   . 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  Her CrCl ~ 29 ml/min.  Goal of Therapy:  Vancomycin trough level 15-20 mcg/ml  Plan:  Zosyn 3.375 gm IV q8 hours Vancomycin 1250 mg IV X 1 then 1gm IV q24 hours. F/u renal function, cultures and clinical course.  Anquan Azzarello [place]et 09/22/2013,7:01 AM

## 2013-09-22 NOTE — H&P (Signed)
PULMONARY  / CRITICAL CARE MEDICINE  Name: Colleen [name] MRN: [id number] DOB: [birth year]    ADMISSION DATE:  09/21/2013 CONSULTATION DATE:  09/22/2013  REFERRING MD :  Dr. Stevie Kern PRIMARY SERVICE: PCCM  CHIEF COMPLAINT:  Weakness and vomiting  BRIEF PATIENT DESCRIPTION: 52 y/o female presenting [facility] with weakness and emesis found to be in DKA vs HONC with shock requiring pressors.   SIGNIFICANT EVENTS / STUDIES:  10/15 presented to [facility], given IV fluids, IV insulin, and pressors 10/16 transferred to [facility] ICU  LINES / TUBES: L IJ 10/16>>> Peripheral IV X 2  CULTURES: Blood 10/15>>> Urine 10/15>>> MRSA PCR 10/15>>>  ANTIBIOTICS: Rocephin 10/15>>>10/16 Vancomycin 10/16>>> Zosyn 1016>>>  HISTORY OF PRESENT ILLNESS:   Colleen [name] reports that she has been feeling progressively ill for the last 2 months. This has acutely worsened over the last 2 days with profound weakness and several episodes of non-bloody emesis and diarrhea. She ate some chicken 2 nights ago and she began to vomit afterwards and so attributed her symptoms to that. She denies fever, headache, chest pain, dyspnea, cough, increased sputum, dysuria, vaginal discharge, edema, and recent travel or long periods of immobilization. She seems to be slightly confused according to her brother. She has had some chills but has them at baseline explaining that she is cold-natured.   She states that she has diabetes but has not been watching her diet or taking metformin for around 2 years.   PAST MEDICAL HISTORY :  Past Medical History  Diagnosis Date  . Diabetes   . 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 No pertinent past surgical history. Prior to Admission medications   Not on File   No Known Allergies  FAMILY HISTORY:  History reviewed. No pertinent family history. SOCIAL HISTORY:  reports that she has  never smoked. She does not have any smokeless tobacco history on file. She reports that she does not drink alcohol or use illicit drugs.  REVIEW OF SYSTEMS:  Per HPI  SUBJECTIVE:   VITAL SIGNS: Temp:  [100.1 F (37.8 C)-104.3 F (40.2 C)] 100.9 F (38.3 C) (10/16 0247) Pulse Rate:  [106-131] 110 (10/16 0535) Resp:  [16-30] 26 (10/16 0535) BP: (64-119)/(15-58) 85/58 mmHg (10/16 0535) SpO2:  [91 %-100 %] 100 % (10/16 0535) Weight:  [163 lb (73.936 kg)] 163 lb (73.936 kg) (10/15 2239) HEMODYNAMICS:   VENTILATOR SETTINGS:   INTAKE / OUTPUT: Intake/Output   None     PHYSICAL EXAMINATION: Gen: NAD, alert, cooperative with exam, on non re-breather HEENT: NCAT, EOMI, PERRL, L IJ inplace CV: RRR, good S1/S2, no murmur Resp: crackles at BL bases, good air movement, non labored, non re-breather Abd: SNTND, BS present, no guarding or organomegaly Ext: trace pitting edema BL, 2+ DP pusles Neuro: Alert, interactive, strength 5/5 and sensation intact in all 4 extremities Skin: intact  LABS:  CBC Recent Labs     09/21/13  2250  WBC  16.9*  HGB  12.1  HCT  35.6*  PLT  152   Coag's No results found for this basename: APTT, INR,  in the last 72 hours BMET Recent Labs     09/21/13  2250  NA  124*  K  3.7  CL  86*  CO2  20  BUN  22  CREATININE  2.20*  GLUCOSE  806*   Electrolytes Recent Labs  09/21/13  2250  CALCIUM  8.9   Sepsis Markers No results found for this basename: LACTICACIDVEN, PROCALCITON, O2SATVEN,  in the last 72 hours ABG No results found for this basename: PHART, PCO2ART, PO2ART,  in the last 72 hours Liver Enzymes Recent Labs     09/21/13  2250  AST  15  ALT  8  ALKPHOS  173*  BILITOT  0.3  ALBUMIN  2.8*   Cardiac Enzymes No results found for this basename: TROPONINI, PROBNP,  in the last 72 hours Glucose Recent Labs     09/21/13  2245  09/22/13  0150  09/22/13  0255  09/22/13  0410  GLUCAP  >600*  518*  431*  295*     Imaging Dg Chest Port 1 View  09/22/2013   CLINICAL DATA:  Emesis and diarrhea for 1 week, with chills and fever.  EXAM: PORTABLE CHEST - 1 VIEW  COMPARISON:  None.  FINDINGS: Cardiomegaly. Low lung volumes. Moderate vascular congestion. No overt failure or areas of lobar consolidation. No effusion or pneumothorax. Negative osseous structures. No visible free air on this portable semi erect radiograph.  IMPRESSION: Cardiomegaly with low lung volumes. No definite active infiltrates or failure.   Electronically Signed   By: Rolla Flatten M.D.   On: 09/22/2013 01:34   CXR: L IJ in place with tip in R atrium, opacities in R and L bases c/w pna vs atelectasis.   ASSESSMENT / PLAN:  PULMONARY A: Acute respiratory failure P:   Non-re-breather, wean as tolerated  CARDIOVASCULAR A:  Shock, likely hypovolemia and possible component of sepsis P:  Levophed, central line placed for CVP's No additional boluses with likely pulmonary edema TTE to assess LV fxn Serial troponin Serial lactate   RENAL A:   AKI, likely hypotensive ATN vs other pre-renal although BUN/Cre is 10 Possible component of CKD with longstanding untreated DM2 Hyponatremia - 125 corrects to 132 accounting for glucose P:   IV NS at 75 Trend BMP  GASTROINTESTINAL A:   Diarrhea, vomiting P:   See ID SUP protonix  HEMATOLOGIC A:   Leukocytosis P:  See ID Trend CBC  INFECTIOUS A:   Possible sepsis with septic shock, no clear infectious source Possible Gi pathogen P:   empiric vanc and zosyn oredered Blood and urine cultures pending Sepsis protocol GI pathogen panel C diff PCR procalcitonin   ENDOCRINE A:   DM2   DKA vs HONC P:   Hold metformin > she wasn't taking it IV insulin, glucomander Check Ketones, osmolarity  NEUROLOGIC A:   Suspected confusion P:   Routine monitoring Avoid sedating meds  Laroy Apple, MD [employer] Resident, PGY-2 09/22/2013, 6:06 AM  60 min  CC time   Baltazar Apo, MD, PhD 09/22/2013, 9:50 AM [facility] [phone] or if no answer [phone]

## 2013-09-22 NOTE — ED Notes (Addendum)
IV fluids increased to bolus and O2 increased to 4LPM via [place]. Dr. Stevie Kern at bedside.

## 2013-09-22 NOTE — Procedures (Signed)
Arterial Catheter Insertion Procedure Note Colleen [name] [id number] 20-Jun-1961  Procedure: Insertion of Arterial Catheter  Indications: Blood pressure monitoring  Procedure Details Consent: Risks of procedure as well as the alternatives and risks of each were explained to the (patient/caregiver).  Consent for procedure obtained. Time Out: Verified patient identification, verified procedure, site/side was marked, verified correct patient position, special equipment/implants available, medications/allergies/relevent history reviewed, required imaging and test results available.  Performed  Maximum sterile technique was used including antiseptics, cap, gloves, gown, hand hygiene, mask and sheet. Skin prep: Chlorhexidine; local anesthetic administered 20 gauge catheter was inserted into right radial artery using the Seldinger technique.  Evaluation Blood flow good; BP tracing good. Complications: No apparent complications.   Hope Pigeon, MA 09/22/2013

## 2013-09-22 NOTE — Progress Notes (Signed)
Nutrition Consult - Brief Note  Received consult for diabetes diet education. Patient is currently in the ICU, not fully alert at this time. Not appropriate for diet education at this time. Will order OP nutrition counseling. RD to follow-up at a later time for diet education.   [name], RD, LDN, [place] Pager [phone] After Hours Pager [phone]

## 2013-09-23 ENCOUNTER — Inpatient Hospital Stay (HOSPITAL_COMMUNITY): Payer: Federal, State, Local not specified - PPO

## 2013-09-23 LAB — BASIC METABOLIC PANEL
BUN: 26 mg/dL — ABNORMAL HIGH (ref 6–23)
BUN: 25 mg/dL — ABNORMAL HIGH (ref 6–23)
BUN: 24 mg/dL — ABNORMAL HIGH (ref 6–23)
BUN: 24 mg/dL — ABNORMAL HIGH (ref 6–23)
BUN: 23 mg/dL (ref 6–23)
BUN: 23 mg/dL (ref 6–23)
CO2: 13 mEq/L — ABNORMAL LOW (ref 19–32)
CO2: 14 mEq/L — ABNORMAL LOW (ref 19–32)
CO2: 17 mEq/L — ABNORMAL LOW (ref 19–32)
CO2: 17 mEq/L — ABNORMAL LOW (ref 19–32)
CO2: 18 mEq/L — ABNORMAL LOW (ref 19–32)
CO2: 18 mEq/L — ABNORMAL LOW (ref 19–32)
Calcium: 6.9 mg/dL — ABNORMAL LOW (ref 8.4–10.5)
Calcium: 7.1 mg/dL — ABNORMAL LOW (ref 8.4–10.5)
Calcium: 7.1 mg/dL — ABNORMAL LOW (ref 8.4–10.5)
Calcium: 7.1 mg/dL — ABNORMAL LOW (ref 8.4–10.5)
Calcium: 7.1 mg/dL — ABNORMAL LOW (ref 8.4–10.5)
Calcium: 7.1 mg/dL — ABNORMAL LOW (ref 8.4–10.5)
Chloride: 106 mEq/L (ref 96–112)
Chloride: 105 mEq/L (ref 96–112)
Chloride: 104 mEq/L (ref 96–112)
Chloride: 104 mEq/L (ref 96–112)
Chloride: 104 mEq/L (ref 96–112)
Chloride: 106 mEq/L (ref 96–112)
Creatinine, Ser: 2.54 mg/dL — ABNORMAL HIGH (ref 0.50–1.10)
Creatinine, Ser: 2.52 mg/dL — ABNORMAL HIGH (ref 0.50–1.10)
Creatinine, Ser: 2.43 mg/dL — ABNORMAL HIGH (ref 0.50–1.10)
Creatinine, Ser: 2.38 mg/dL — ABNORMAL HIGH (ref 0.50–1.10)
Creatinine, Ser: 2.38 mg/dL — ABNORMAL HIGH (ref 0.50–1.10)
Creatinine, Ser: 2.35 mg/dL — ABNORMAL HIGH (ref 0.50–1.10)
GFR calc Af Amer: 24 mL/min — ABNORMAL LOW (ref >90)
GFR calc Af Amer: 24 mL/min — ABNORMAL LOW (ref >90)
GFR calc Af Amer: 25 mL/min — ABNORMAL LOW (ref >90)
GFR calc Af Amer: 26 mL/min — ABNORMAL LOW (ref >90)
GFR calc Af Amer: 26 mL/min — ABNORMAL LOW (ref >90)
GFR calc Af Amer: 26 mL/min — ABNORMAL LOW (ref >90)
GFR calc non Af Amer: 21 mL/min — ABNORMAL LOW (ref >90)
GFR calc non Af Amer: 21 mL/min — ABNORMAL LOW (ref >90)
GFR calc non Af Amer: 22 mL/min — ABNORMAL LOW (ref >90)
GFR calc non Af Amer: 22 mL/min — ABNORMAL LOW (ref >90)
GFR calc non Af Amer: 22 mL/min — ABNORMAL LOW (ref >90)
GFR calc non Af Amer: 23 mL/min — ABNORMAL LOW (ref >90)
Glucose, Bld: 208 mg/dL — ABNORMAL HIGH (ref 70–99)
Glucose, Bld: 196 mg/dL — ABNORMAL HIGH (ref 70–99)
Glucose, Bld: 157 mg/dL — ABNORMAL HIGH (ref 70–99)
Glucose, Bld: 149 mg/dL — ABNORMAL HIGH (ref 70–99)
Glucose, Bld: 124 mg/dL — ABNORMAL HIGH (ref 70–99)
Glucose, Bld: 169 mg/dL — ABNORMAL HIGH (ref 70–99)
Potassium: 4.3 mEq/L (ref 3.5–5.1)
Potassium: 3.9 mEq/L (ref 3.5–5.1)
Potassium: 3.2 mEq/L — ABNORMAL LOW (ref 3.5–5.1)
Potassium: 3 mEq/L — ABNORMAL LOW (ref 3.5–5.1)
Potassium: 3.2 mEq/L — ABNORMAL LOW (ref 3.5–5.1)
Potassium: 3.6 mEq/L (ref 3.5–5.1)
Sodium: 133 mEq/L — ABNORMAL LOW (ref 135–145)
Sodium: 134 mEq/L — ABNORMAL LOW (ref 135–145)
Sodium: 135 mEq/L (ref 135–145)
Sodium: 135 mEq/L (ref 135–145)
Sodium: 135 mEq/L (ref 135–145)
Sodium: 137 mEq/L (ref 135–145)

## 2013-09-23 LAB — CBC
HCT: 32.9 % — ABNORMAL LOW (ref 36.0–46.0)
Hemoglobin: 11.7 g/dL — ABNORMAL LOW (ref 12.0–15.0)
MCH: 24.2 pg — ABNORMAL LOW (ref 26.0–34.0)
MCHC: 35.6 g/dL (ref 30.0–36.0)
MCV: 68.1 fL — ABNORMAL LOW (ref 78.0–100.0)
Platelets: 62 10*3/uL — ABNORMAL LOW (ref 150–400)
RBC: 4.83 MIL/uL (ref 3.87–5.11)
RDW: 15.3 % (ref 11.5–15.5)
WBC: 19 10*3/uL — ABNORMAL HIGH (ref 4.0–10.5)

## 2013-09-23 LAB — GLUCOSE, CAPILLARY
Glucose-Capillary: 123 mg/dL — ABNORMAL HIGH (ref 70–99)
Glucose-Capillary: 136 mg/dL — ABNORMAL HIGH (ref 70–99)
Glucose-Capillary: 148 mg/dL — ABNORMAL HIGH (ref 70–99)
Glucose-Capillary: 150 mg/dL — ABNORMAL HIGH (ref 70–99)
Glucose-Capillary: 153 mg/dL — ABNORMAL HIGH (ref 70–99)
Glucose-Capillary: 179 mg/dL — ABNORMAL HIGH (ref 70–99)
Glucose-Capillary: 181 mg/dL — ABNORMAL HIGH (ref 70–99)
Glucose-Capillary: 182 mg/dL — ABNORMAL HIGH (ref 70–99)
Glucose-Capillary: 185 mg/dL — ABNORMAL HIGH (ref 70–99)
Glucose-Capillary: 192 mg/dL — ABNORMAL HIGH (ref 70–99)
Glucose-Capillary: 193 mg/dL — ABNORMAL HIGH (ref 70–99)
Glucose-Capillary: 146 mg/dL — ABNORMAL HIGH (ref 70–99)
Glucose-Capillary: 147 mg/dL — ABNORMAL HIGH (ref 70–99)
Glucose-Capillary: 155 mg/dL — ABNORMAL HIGH (ref 70–99)
Glucose-Capillary: 160 mg/dL — ABNORMAL HIGH (ref 70–99)
Glucose-Capillary: 197 mg/dL — ABNORMAL HIGH (ref 70–99)
Glucose-Capillary: 116 mg/dL — ABNORMAL HIGH (ref 70–99)
Glucose-Capillary: 129 mg/dL — ABNORMAL HIGH (ref 70–99)
Glucose-Capillary: 124 mg/dL — ABNORMAL HIGH (ref 70–99)
Glucose-Capillary: 125 mg/dL — ABNORMAL HIGH (ref 70–99)
Glucose-Capillary: 147 mg/dL — ABNORMAL HIGH (ref 70–99)
Glucose-Capillary: 119 mg/dL — ABNORMAL HIGH (ref 70–99)
Glucose-Capillary: 161 mg/dL — ABNORMAL HIGH (ref 70–99)

## 2013-09-23 LAB — URINE CULTURE: Colony Count: >=100000

## 2013-09-23 LAB — BLOOD GAS, ARTERIAL
Acid-base deficit: 8.4 mmol/L — ABNORMAL HIGH (ref 0.0–2.0)
Bicarbonate: 15.4 mEq/L — ABNORMAL LOW (ref 20.0–24.0)
Drawn by: 32526
O2 Content: 4 L/min
O2 Saturation: 97 %
Patient temperature: 98.6
TCO2: 16.2 mmol/L (ref 0–100)
pCO2 arterial: 25.1 mmHg — ABNORMAL LOW (ref 35.0–45.0)
pH, Arterial: 7.406 (ref 7.350–7.450)
pO2, Arterial: 78.4 mmHg — ABNORMAL LOW (ref 80.0–100.0)

## 2013-09-23 LAB — GI PATHOGEN PANEL BY PCR, STOOL
C difficile toxin A/B: NEGATIVE
Campylobacter by PCR: NEGATIVE
Cryptosporidium by PCR: NEGATIVE
E coli (ETEC) LT/ST: NEGATIVE
E coli (STEC): NEGATIVE
E coli 0157 by PCR: NEGATIVE
G lamblia by PCR: NEGATIVE
Norovirus GI/GII: NEGATIVE
Rotavirus A by PCR: NEGATIVE
Salmonella by PCR: NEGATIVE
Shigella by PCR: NEGATIVE

## 2013-09-23 LAB — DIC (DISSEMINATED INTRAVASCULAR COAGULATION)PANEL
Fibrinogen: 644 mg/dL — ABNORMAL HIGH (ref 204–475)
Prothrombin Time: 18.8 seconds — ABNORMAL HIGH (ref 11.6–15.2)
Smear Review: NONE SEEN

## 2013-09-23 LAB — DIC (DISSEMINATED INTRAVASCULAR COAGULATION) PANEL (NOT AT ARMC)
D-Dimer, Quant: 17.58 ug/mL-FEU — ABNORMAL HIGH (ref 0.00–0.48)
INR: 1.62 — ABNORMAL HIGH (ref 0.00–1.49)
Platelets: 58 10*3/uL — ABNORMAL LOW (ref 150–400)
aPTT: 49 seconds — ABNORMAL HIGH (ref 24–37)

## 2013-09-23 LAB — PROCALCITONIN: Procalcitonin: 82 ng/mL

## 2013-09-23 LAB — LACTIC ACID, PLASMA: Lactic Acid, Venous: 3.3 mmol/L — ABNORMAL HIGH (ref 0.5–2.2)

## 2013-09-23 MED ORDER — ALBUTEROL SULFATE (5 MG/ML) 0.5% IN NEBU
2.5000 mg | INHALATION_SOLUTION | Freq: Four times a day (QID) | RESPIRATORY_TRACT | Status: DC
  Administered 2013-09-23 – 2013-10-01 (×33): 2.5 mg via RESPIRATORY_TRACT
  Filled 2013-09-23 (×33): qty 0.5

## 2013-09-23 MED ORDER — ALBUTEROL SULFATE (5 MG/ML) 0.5% IN NEBU
2.5000 mg | INHALATION_SOLUTION | RESPIRATORY_TRACT | Status: DC | PRN
Start: 2013-09-23 — End: 2013-10-09
  Filled 2013-09-23: qty 0.5

## 2013-09-23 MED ORDER — POTASSIUM CHLORIDE CRYS ER 20 MEQ PO TBCR
40.0000 meq | EXTENDED_RELEASE_TABLET | Freq: Once | ORAL | Status: AC
  Administered 2013-09-23: 40 meq via ORAL
  Filled 2013-09-23: qty 2

## 2013-09-23 NOTE — Progress Notes (Addendum)
Nutrition Follow-up - Brief Note  Patient is still lethargic; not appropriate for extensive diet education. RD provided CHO-counting handout. Patient to review when she wakes up. OP nutrition counseling has been ordered.  [name], RD, LDN, [place] Pager [phone] After Hours Pager [phone]

## 2013-09-23 NOTE — Progress Notes (Signed)
Chaplain followed up with pt, pt's mother, and pt's brother. They were receptive to chaplain's visit, but said they "were fine." Chaplain explained that nurses can contact chaplain if they want chaplain support later.   Chaplain offering emotional/spiritual support and empathic listening. Family and pt were grateful for visit.   Ethelene Browns, [place]

## 2013-09-23 NOTE — Progress Notes (Signed)
Anion gap 13.

## 2013-09-23 NOTE — Progress Notes (Signed)
PCCM PGY-2 Interim Note  Paged to bedside, pt pulled out left IJ central line. Vasopressin infusing via PIV, but pressures have been steady ~110/80. Pt denies pain. Will try to remove vasopressin and if able will avoid replacing central line. However, pt still hyperventilating and still with acidosis, so may need central line in case of further decompensation. Will discuss with Dr. Lake Bells.  Emmaline Kluver, MD PGY-2, [facility] 09/23/2013, 12:14 AM PCCM service pager [phone]

## 2013-09-23 NOTE — Progress Notes (Signed)
anaion gap 14

## 2013-09-23 NOTE — Consult Note (Signed)
PULMONARY  / CRITICAL CARE MEDICINE  Name: Colleen [name] MRN: [id number] DOB: [birth year]    ADMISSION DATE:  09/21/2013 CONSULTATION DATE:  09/22/2013  REFERRING MD :  Dr. Stevie Kern PRIMARY SERVICE: PCCM  CHIEF COMPLAINT:  Weakness and vomiting  BRIEF PATIENT DESCRIPTION: 52 y/o female [facility] with weakness and emesis found to be in DKA vs HONC with shock requiring pressors.   SIGNIFICANT EVENTS / STUDIES:  10/15 presented to [facility], given IV fluids, IV insulin, and pressors 10/16 transferred to [facility] ICU 10/16 pulled out L IJ  LINES / TUBES: L IJ 10/16>>> Peripheral IV X 2  CULTURES: Blood 10/15>>> GNR >>> Urine 10/15>>> GNR >>> MRSA PCR 10/15>>> Cdiff 10/16 neg Cdiff 10/16>>> GI pathogen panel 10/16>>>  ANTIBIOTICS: Rocephin 10/15>>>10/16 Vancomycin 10/16>>> Zosyn 1016>>>  SUBJECTIVE: Removed IJ overnight, no apparent confusion, states she has a worsening cough and dyspnea  VITAL SIGNS: Temp:  [91.7 F (33.2 C)-100.8 F (38.2 C)] 98.5 F (36.9 C) (10/17 1000) Pulse Rate:  [81-131] 127 (10/17 1000) Resp:  [18-49] 21 (10/17 1000) BP: (88-134)/(48-90) 104/77 mmHg (10/17 1000) SpO2:  [86 %-100 %] 99 % (10/17 1000) Arterial Line BP: (94-131)/(59-76) 110/59 mmHg (10/17 1000) HEMODYNAMICS: CVP:  [13 mmHg-14 mmHg] 13 mmHg VENTILATOR SETTINGS:   INTAKE / OUTPUT: Intake/Output     10/16 0701 - 10/17 0700 10/17 0701 - 10/18 0700   I.V. (mL/kg) 4554.5 (61.6) 526.3 (7.1)   IV Piggyback 425 37.5   Total Intake(mL/kg) 4979.5 (67.3) 563.8 (7.6)   Urine (mL/kg/hr) 690 (0.4) 250 (0.8)   Total Output 690 250   Net +4289.5 +313.8        Stool Occurrence 4 x      PHYSICAL EXAMINATION: Gen: NAD, alert, cooperative with exam HEENT: NCAT, EOMI, PERRL, R facial droop CV: RRR, good S1/S2, no murmur Resp: crackles throughout, some exp wheezes, decreased air movement at the bases BL, RR 31, mod increased WOB Abd: SNTND, BS present,  no guarding or organomegaly Ext: 1+ pitting edema, 2+ DP pusles Neuro: Alert, interactive Skin: intact  LABS:  CBC Recent Labs     09/21/13  2250  09/22/13  0713  09/23/13  0400  09/23/13  0907  WBC  16.9*  17.4*  19.0*   --   HGB  12.1  11.1*  11.7*   --   HCT  35.6*  32.4*  32.9*   --   PLT  152  95*  62*  58*   Coag's Recent Labs     09/22/13  1151  09/23/13  0907  APTT  38*  49*  INR  1.61*  1.62*   BMET Recent Labs     09/23/13  0042  09/23/13  0400  09/23/13  0917  NA  133*  134*  135  K  4.3  3.9  3.2*  CL  106  105  104  CO2  13*  14*  17*  BUN  26*  25*  24*  CREATININE  2.54*  2.52*  2.43*  GLUCOSE  208*  196*  157*   Electrolytes Recent Labs     09/23/13  0042  09/23/13  0400  09/23/13  0917  CALCIUM  6.9*  7.1*  7.1*   Sepsis Markers Recent Labs     09/22/13  0950  09/23/13  0400  PROCALCITON  122.89  82.00   ABG Recent Labs     09/23/13  0840  PHART  7.406  PCO2ART  25.1*  PO2ART  78.4*   Liver Enzymes Recent Labs     09/21/13  2250  AST  15  ALT  8  ALKPHOS  173*  BILITOT  0.3  ALBUMIN  2.8*   Cardiac Enzymes Recent Labs     09/22/13  0800  TROPONINI  <0.30   Glucose Recent Labs     09/23/13  0039  09/23/13  0148  09/23/13  0308  09/23/13  0412  09/23/13  0522  09/23/13  0625  GLUCAP  193*  192*  148*  181*  179*  185*    Imaging Dg Chest Port 1 View  09/23/2013   CLINICAL DATA:  Pulmonary edema.  EXAM: PORTABLE CHEST - 1 VIEW  COMPARISON:  09/22/2013  FINDINGS: Left-sided central line has been removed.  Lung apices not entirely included on present exam limiting evaluation for detection of small pneumothorax. No gross pneumothorax detected.  Slightly asymmetric airspace disease suggestive of pulmonary edema although infectious process not excluded in the proper clinical setting.  Heart size top-normal  IMPRESSION: Slightly asymmetric diffuse airspace disease similar to prior exam. This may represent pulmonary  edema although infectious infiltrate could not be excluded in the proper clinical setting.  Left central line removed  Lung apices not entirely included on present exam.   Electronically Signed   By: Chauncey Cruel M.D.   On: 09/23/2013 08:01   Dg Chest Port 1 View  09/22/2013   CLINICAL DATA:  Pulmonary edema, central line placement.  EXAM: PORTABLE CHEST - 1 VIEW  COMPARISON:  September 22, 2013.  FINDINGS: There has been interval placement of left internal jugular catheter line with distal tip in the expected position of the right atrium ; it is recommended to be withdrawn 1-2 cm. Stable mild cardiomegaly. Moderate central pulmonary vascular congestion is again noted and unchanged. Increased alveolar opacity is noted in the right lung base concerning for pneumonia or atelectasis. No pneumothorax is noted.  IMPRESSION: Increased right basilar opacity concerning for pneumonia or subsegmental atelectasis. Stable cardiomegaly and central pulmonary vascular congestion is noted. Interval placement of left internal jugular catheter line with distal tip in the expected position of the right atrium ; it is recommended at be withdrawn by 1-2 cm.   Electronically Signed   By: Sabino Dick M.D.   On: 09/22/2013 07:27   Dg Chest Port 1 View  09/22/2013   CLINICAL DATA:  Emesis and diarrhea for 1 week, with chills and fever.  EXAM: PORTABLE CHEST - 1 VIEW  COMPARISON:  None.  FINDINGS: Cardiomegaly. Low lung volumes. Moderate vascular congestion. No overt failure or areas of lobar consolidation. No effusion or pneumothorax. Negative osseous structures. No visible free air on this portable semi erect radiograph.  IMPRESSION: Cardiomegaly with low lung volumes. No definite active infiltrates or failure.   Electronically Signed   By: Rolla Flatten M.D.   On: 09/22/2013 01:34   CXR: L IJ removed since last CXR, BL opacities, slightly improved on L making pulmonary edema more likely  ASSESSMENT /  PLAN:  PULMONARY A: Acute respiratory failure Tachypnea- acidosis compensation + evolving ALI, edema P:   Support with [place] currently, RT to escalate to reduce WOB High risk intubation. BiPAP would be an option if she will comply. She has adequate MS ABG CXR in am No Lasix as intravascular volume depleted and Cre worsening  CARDIOVASCULAR A:  Shock, Sepsis, hypovolemic, and component of cardiogenic Systolic CHF, EF [id number]  Troponin neg X 1 P:  Limit fluids as possible, difficult with DKA vs HHONK Serial lactate  Continue bicarb gtt, stop d50.45NS  RENAL A:   AKI, likely hypotensive ATN  Possible component of CKD with longstanding untreated DM2 Hyponatremia - 125 corrects to 132 accounting for glucose AG Metabolic Acidosis, lactic acidosis- DKA (acetones negative) P:   Limit fluids with pulm edema, periph edema Trend BMP per DKA and sepsis protocol  GASTROINTESTINAL A:   Diarrhea, vomiting P:   See ID SUP protonix  HEMATOLOGIC A:   Leukocytosis Thrombocytopenia  P:  DIC panel, HITT panel Dc lovenox, place SCDs Trend CBC  See ID  INFECTIOUS A:   Septic shock GNR bacteremia - pct indicates effective Tx, WBC still trending up GNR UTI Possible Gi pathogen, C diff negative P:   Narrow to zosyn alone 10/17 Blood and urine cultures pending, both with GNR Sepsis protocol GI pathogen panel procalcitonin   ENDOCRINE A:   DM2   DKA vs HONC (ketone neg and osmol WNL) P:   Hold metformin > she wasn't taking it IV insulin, glucomander Bicarb > to 75cc/h  NEUROLOGIC A:   Suspected confusion - removing central and flexiseal P:   Routine monitoring Avoid sedating meds  Todays summary: Will continue IV antibiotics and insulin per protocolo for sepsis and DKA vs HONC. Will dc lovenox and collect HIT panel and DIC panel with her thrombocytopenia.   Laroy Apple, MD [place] Resident, PGY-2 09/23/2013, 11:24 AM  40 minutes CC  time  Baltazar Apo, MD, PhD 09/23/2013, 11:40 AM [facility] [phone] or if no answer [phone]

## 2013-09-24 ENCOUNTER — Inpatient Hospital Stay (HOSPITAL_COMMUNITY): Payer: Federal, State, Local not specified - PPO

## 2013-09-24 DIAGNOSIS — J9600 Acute respiratory failure, unspecified whether with hypoxia or hypercapnia: Secondary | ICD-10-CM

## 2013-09-24 LAB — CULTURE, BLOOD (ROUTINE X 2)

## 2013-09-24 LAB — BASIC METABOLIC PANEL
BUN: 24 mg/dL — ABNORMAL HIGH (ref 6–23)
BUN: 24 mg/dL — ABNORMAL HIGH (ref 6–23)
BUN: 25 mg/dL — ABNORMAL HIGH (ref 6–23)
BUN: 25 mg/dL — ABNORMAL HIGH (ref 6–23)
CO2: 18 mEq/L — ABNORMAL LOW (ref 19–32)
CO2: 19 mEq/L (ref 19–32)
CO2: 20 mEq/L (ref 19–32)
CO2: 21 mEq/L (ref 19–32)
Calcium: 7.1 mg/dL — ABNORMAL LOW (ref 8.4–10.5)
Calcium: 7.1 mg/dL — ABNORMAL LOW (ref 8.4–10.5)
Calcium: 7.1 mg/dL — ABNORMAL LOW (ref 8.4–10.5)
Calcium: 7.3 mg/dL — ABNORMAL LOW (ref 8.4–10.5)
Chloride: 102 mEq/L (ref 96–112)
Chloride: 103 mEq/L (ref 96–112)
Chloride: 103 mEq/L (ref 96–112)
Chloride: 107 mEq/L (ref 96–112)
Creatinine, Ser: 2.29 mg/dL — ABNORMAL HIGH (ref 0.50–1.10)
Creatinine, Ser: 2.38 mg/dL — ABNORMAL HIGH (ref 0.50–1.10)
Creatinine, Ser: 2.39 mg/dL — ABNORMAL HIGH (ref 0.50–1.10)
Creatinine, Ser: 2.4 mg/dL — ABNORMAL HIGH (ref 0.50–1.10)
GFR calc Af Amer: 26 mL/min — ABNORMAL LOW (ref >90)
GFR calc Af Amer: 26 mL/min — ABNORMAL LOW (ref >90)
GFR calc Af Amer: 26 mL/min — ABNORMAL LOW (ref >90)
GFR calc Af Amer: 27 mL/min — ABNORMAL LOW (ref >90)
GFR calc non Af Amer: 22 mL/min — ABNORMAL LOW (ref >90)
GFR calc non Af Amer: 22 mL/min — ABNORMAL LOW (ref >90)
GFR calc non Af Amer: 22 mL/min — ABNORMAL LOW (ref >90)
GFR calc non Af Amer: 24 mL/min — ABNORMAL LOW (ref >90)
Glucose, Bld: 139 mg/dL — ABNORMAL HIGH (ref 70–99)
Glucose, Bld: 152 mg/dL — ABNORMAL HIGH (ref 70–99)
Glucose, Bld: 164 mg/dL — ABNORMAL HIGH (ref 70–99)
Glucose, Bld: 181 mg/dL — ABNORMAL HIGH (ref 70–99)
Potassium: 3.1 mEq/L — ABNORMAL LOW (ref 3.5–5.1)
Potassium: 3.2 mEq/L — ABNORMAL LOW (ref 3.5–5.1)
Potassium: 3.4 mEq/L — ABNORMAL LOW (ref 3.5–5.1)
Potassium: 3.6 mEq/L (ref 3.5–5.1)
Sodium: 135 mEq/L (ref 135–145)
Sodium: 136 mEq/L (ref 135–145)
Sodium: 136 mEq/L (ref 135–145)
Sodium: 138 mEq/L (ref 135–145)

## 2013-09-24 LAB — GLUCOSE, CAPILLARY
Glucose-Capillary: 156 mg/dL — ABNORMAL HIGH (ref 70–99)
Glucose-Capillary: 160 mg/dL — ABNORMAL HIGH (ref 70–99)
Glucose-Capillary: 163 mg/dL — ABNORMAL HIGH (ref 70–99)
Glucose-Capillary: 165 mg/dL — ABNORMAL HIGH (ref 70–99)
Glucose-Capillary: 165 mg/dL — ABNORMAL HIGH (ref 70–99)
Glucose-Capillary: 170 mg/dL — ABNORMAL HIGH (ref 70–99)
Glucose-Capillary: 171 mg/dL — ABNORMAL HIGH (ref 70–99)
Glucose-Capillary: 188 mg/dL — ABNORMAL HIGH (ref 70–99)
Glucose-Capillary: 137 mg/dL — ABNORMAL HIGH (ref 70–99)
Glucose-Capillary: 139 mg/dL — ABNORMAL HIGH (ref 70–99)
Glucose-Capillary: 141 mg/dL — ABNORMAL HIGH (ref 70–99)
Glucose-Capillary: 146 mg/dL — ABNORMAL HIGH (ref 70–99)
Glucose-Capillary: 151 mg/dL — ABNORMAL HIGH (ref 70–99)
Glucose-Capillary: 146 mg/dL — ABNORMAL HIGH (ref 70–99)
Glucose-Capillary: 148 mg/dL — ABNORMAL HIGH (ref 70–99)
Glucose-Capillary: 169 mg/dL — ABNORMAL HIGH (ref 70–99)
Glucose-Capillary: 175 mg/dL — ABNORMAL HIGH (ref 70–99)
Glucose-Capillary: 187 mg/dL — ABNORMAL HIGH (ref 70–99)
Glucose-Capillary: 164 mg/dL — ABNORMAL HIGH (ref 70–99)
Glucose-Capillary: 187 mg/dL — ABNORMAL HIGH (ref 70–99)
Glucose-Capillary: 180 mg/dL — ABNORMAL HIGH (ref 70–99)

## 2013-09-24 LAB — PROCALCITONIN: Procalcitonin: 40.95 ng/mL

## 2013-09-24 LAB — POCT I-STAT 3, ART BLOOD GAS (G3+)
Acid-base deficit: 6 mmol/L — ABNORMAL HIGH (ref 0.0–2.0)
Bicarbonate: 18.6 mEq/L — ABNORMAL LOW (ref 20.0–24.0)
O2 Saturation: 95 %
Patient temperature: 101.7
TCO2: 20 mmol/L (ref 0–100)
pCO2 arterial: 36.3 mmHg (ref 35.0–45.0)
pH, Arterial: 7.326 — ABNORMAL LOW (ref 7.350–7.450)
pO2, Arterial: 86 mmHg (ref 80.0–100.0)

## 2013-09-24 LAB — BLOOD GAS, ARTERIAL
Acid-base deficit: 3.5 mmol/L — ABNORMAL HIGH (ref 0.0–2.0)
Bicarbonate: 19.2 mEq/L — ABNORMAL LOW (ref 20.0–24.0)
Drawn by: 27733
O2 Content: 6 L/min
O2 Saturation: 87.3 %
Patient temperature: 100.9
TCO2: 20 mmol/L (ref 0–100)
pCO2 arterial: 26.5 mmHg — ABNORMAL LOW (ref 35.0–45.0)
pH, Arterial: 7.478 — ABNORMAL HIGH (ref 7.350–7.450)
pO2, Arterial: 51.1 mmHg — ABNORMAL LOW (ref 80.0–100.0)

## 2013-09-24 MED ORDER — FENTANYL CITRATE 0.05 MG/ML IJ SOLN
INTRAMUSCULAR | Status: AC
  Filled 2013-09-24: qty 4

## 2013-09-24 MED ORDER — WHITE PETROLATUM GEL
Status: AC
  Administered 2013-09-24: 0.2
  Filled 2013-09-24: qty 5

## 2013-09-24 MED ORDER — INSULIN GLARGINE 100 UNIT/ML ~~LOC~~ SOLN
20.0000 [IU] | SUBCUTANEOUS | Status: DC
  Administered 2013-09-24: 20 [IU] via SUBCUTANEOUS
  Filled 2013-09-24: qty 0.2

## 2013-09-24 MED ORDER — WHITE PETROLATUM GEL
Status: AC
  Administered 2013-09-24: 11:00:00
  Filled 2013-09-24: qty 5

## 2013-09-24 MED ORDER — FUROSEMIDE 10 MG/ML IJ SOLN
INTRAMUSCULAR | Status: AC
  Administered 2013-09-24: 22:00:00
  Filled 2013-09-24: qty 4

## 2013-09-24 MED ORDER — BIOTENE DRY MOUTH MT LIQD
15.0000 mL | Freq: Four times a day (QID) | OROMUCOSAL | Status: DC
  Administered 2013-09-25 – 2013-10-02 (×29): 15 mL via OROMUCOSAL

## 2013-09-24 MED ORDER — SUCCINYLCHOLINE CHLORIDE 20 MG/ML IJ SOLN
100.0000 mg | Freq: Once | INTRAMUSCULAR | Status: AC
  Administered 2013-09-24: 100 mg via INTRAVENOUS
  Filled 2013-09-24: qty 5

## 2013-09-24 MED ORDER — PROPOFOL 10 MG/ML IV BOLUS
INTRAVENOUS | Status: AC
  Administered 2013-09-24: 50 mg
  Filled 2013-09-24: qty 20

## 2013-09-24 MED ORDER — FENTANYL CITRATE 0.05 MG/ML IJ SOLN
150.0000 ug | Freq: Once | INTRAMUSCULAR | Status: AC
  Administered 2013-09-24: 150 ug via INTRAVENOUS

## 2013-09-24 MED ORDER — FENTANYL BOLUS VIA INFUSION
25.0000 ug | Freq: Four times a day (QID) | INTRAVENOUS | Status: DC | PRN
  Filled 2013-09-24: qty 100

## 2013-09-24 MED ORDER — DEXTROSE 5 % IV SOLN
1.0000 g | INTRAVENOUS | Status: DC
  Administered 2013-09-24 – 2013-09-26 (×3): 1 g via INTRAVENOUS
  Filled 2013-09-24 (×3): qty 10

## 2013-09-24 MED ORDER — PROPOFOL 10 MG/ML IV BOLUS
50.0000 mg | Freq: Once | INTRAVENOUS | Status: AC
  Administered 2013-09-24: 50 mg via INTRAVENOUS

## 2013-09-24 MED ORDER — KCL IN DEXTROSE-NACL 40-5-0.45 MEQ/L-%-% IV SOLN: INTRAVENOUS | Status: DC

## 2013-09-24 MED ORDER — INSULIN ASPART 100 UNIT/ML ~~LOC~~ SOLN
0.0000 [IU] | SUBCUTANEOUS | Status: DC
  Administered 2013-09-24 (×2): 3 [IU] via SUBCUTANEOUS
  Administered 2013-09-25: 2 [IU] via SUBCUTANEOUS
  Administered 2013-09-25 (×2): 3 [IU] via SUBCUTANEOUS
  Administered 2013-09-25: 2 [IU] via SUBCUTANEOUS
  Administered 2013-09-26: 3 [IU] via SUBCUTANEOUS
  Administered 2013-09-26: 5 [IU] via SUBCUTANEOUS
  Administered 2013-09-26 (×3): 3 [IU] via SUBCUTANEOUS
  Administered 2013-09-26 – 2013-09-27 (×2): 5 [IU] via SUBCUTANEOUS
  Administered 2013-09-27: 11 [IU] via SUBCUTANEOUS
  Administered 2013-09-27: 5 [IU] via SUBCUTANEOUS
  Administered 2013-09-27 (×2): 8 [IU] via SUBCUTANEOUS

## 2013-09-24 MED ORDER — CHLORHEXIDINE GLUCONATE 0.12 % MT SOLN
15.0000 mL | Freq: Two times a day (BID) | OROMUCOSAL | Status: DC
  Administered 2013-09-25 – 2013-10-08 (×24): 15 mL via OROMUCOSAL
  Filled 2013-09-24 (×32): qty 15

## 2013-09-24 MED ORDER — DEXTROSE-NACL 5-0.45 % IV SOLN
INTRAVENOUS | Status: DC
  Administered 2013-09-24: 13:00:00 via INTRAVENOUS

## 2013-09-24 MED ORDER — FENTANYL CITRATE 0.05 MG/ML IJ SOLN
INTRAMUSCULAR | Status: AC
  Administered 2013-09-24: 100 ug
  Filled 2013-09-24: qty 2

## 2013-09-24 MED ORDER — FUROSEMIDE 10 MG/ML IJ SOLN
40.0000 mg | Freq: Once | INTRAMUSCULAR | Status: AC
  Administered 2013-09-24: 40 mg via INTRAVENOUS

## 2013-09-24 MED ORDER — POTASSIUM CHLORIDE 10 MEQ/100ML IV SOLN
10.0000 meq | INTRAVENOUS | Status: AC
  Administered 2013-09-24 (×4): 10 meq via INTRAVENOUS
  Filled 2013-09-24 (×2): qty 100

## 2013-09-24 MED ORDER — PROPOFOL 10 MG/ML IV EMUL
INTRAVENOUS | Status: AC
  Filled 2013-09-24: qty 100

## 2013-09-24 MED ORDER — KCL IN DEXTROSE-NACL 20-5-0.45 MEQ/L-%-% IV SOLN
INTRAVENOUS | Status: DC
  Administered 2013-09-24: 17:00:00 via INTRAVENOUS
  Filled 2013-09-24 (×2): qty 1000

## 2013-09-24 MED ORDER — PROPOFOL 10 MG/ML IV EMUL
5.0000 ug/kg/min | INTRAVENOUS | Status: DC
  Administered 2013-09-24: 15 ug/kg/min via INTRAVENOUS
  Administered 2013-09-25: 50 ug/kg/min via INTRAVENOUS
  Administered 2013-09-25: 45 ug/kg/min via INTRAVENOUS
  Filled 2013-09-24 (×2): qty 100

## 2013-09-24 MED ORDER — ACETAMINOPHEN 325 MG PO TABS
650.0000 mg | ORAL_TABLET | Freq: Four times a day (QID) | ORAL | Status: DC | PRN
  Administered 2013-09-24 (×2): 650 mg via ORAL
  Filled 2013-09-24 (×2): qty 2

## 2013-09-24 MED ORDER — SODIUM CHLORIDE 0.9 % IV SOLN
25.0000 ug/h | INTRAVENOUS | Status: DC
  Administered 2013-09-24: 50 ug/h via INTRAVENOUS
  Administered 2013-09-25: 200 ug/h via INTRAVENOUS
  Administered 2013-09-26 (×2): 400 ug/h via INTRAVENOUS
  Administered 2013-09-26: 175 ug/h via INTRAVENOUS
  Administered 2013-09-27 (×2): 400 ug/h via INTRAVENOUS
  Administered 2013-09-28: 25 ug/h via INTRAVENOUS
  Administered 2013-09-29 – 2013-09-30 (×2): 250 ug/h via INTRAVENOUS
  Filled 2013-09-24 (×12): qty 50

## 2013-09-24 MED ORDER — INSULIN ASPART 100 UNIT/ML ~~LOC~~ SOLN: 0.0000 [IU] | Freq: Three times a day (TID) | SUBCUTANEOUS | Status: DC

## 2013-09-24 NOTE — Progress Notes (Signed)
[facility] Progress Note Patient Name: Colleen [name] DOB: 1960/12/27 MRN: [id number]  Date of Service  09/24/2013   HPI/Events of Note   ABG / CXR resolved  eICU Interventions   D/c IVF Lasix 40 x 1 Bedside MD to see    Intervention Category Major Interventions: Respiratory failure - evaluation and management  Wayden Schwertner 09/24/2013, 6:24 PM

## 2013-09-24 NOTE — Procedures (Signed)
Intubation Procedure Note FAUNA DEISHER [id number] Aug 30, 1961  Procedure: Intubation Indications: Respiratory insufficiency  Procedure Details Consent: Risks of procedure as well as the alternatives and risks of each were explained to the (patient/caregiver).  Consent for procedure obtained. Time Out: Verified patient identification, verified procedure, site/side was marked, verified correct patient position, special equipment/implants available, medications/allergies/relevent history reviewed, required imaging and test results available.  Performed  Medications used: fentanyl and succinylcholine Equipment used: Glidescope #4 Grade I View Correct placement confirmed by by auscultation, by CXR and ETCO2 monitor Tube secured at 23 cm at the lip  Evaluation Hemodynamic Status: BP stable throughout; O2 sats: transiently fell during during procedure Patient's Current Condition: stable Complications: No apparent complications Patient did tolerate procedure well. Chest X-ray ordered to verify placement.  CXR: tube position acceptable.   Waynetta Pean, M.D. Pulmonary and Critical Care Medicine Call [place] with questions [phone] 09/24/2013

## 2013-09-24 NOTE — Procedures (Signed)
Arterial Catheter Insertion Procedure Note Colleen [name] [id number] 17-Apr-1961  Procedure: Insertion of Arterial Catheter  Indications: Blood pressure monitoring  Procedure Details Consent: Unable to obtain consent because of emergent medical necessity. Time Out: Verified patient identification, verified procedure, site/side was marked, verified correct patient position, special equipment/implants available, medications/allergies/relevent history reviewed, required imaging and test results available.  Performed  Maximum sterile technique was used including antiseptics, gloves and sheet. Skin prep: Chlorhexidine; local anesthetic administered 20 gauge catheter was inserted into right radial artery using the Seldinger technique.  Evaluation Blood flow good; BP tracing good. Complications: No apparent complications.   Mindi Slicker 09/24/2013

## 2013-09-24 NOTE — Progress Notes (Signed)
[place] Progress Note Patient Name: Colleen [name] DOB: 08-26-1961 MRN: [id number]  Date of Service  09/24/2013   HPI/Events of Note   Increased work of breathing and oxygen requirements  eICU Interventions   ABG PCXR    Intervention Category Major Interventions: Respiratory failure - evaluation and management  Yanci Bachtell 09/24/2013, 4:55 PM

## 2013-09-24 NOTE — Progress Notes (Signed)
PULMONARY  / CRITICAL CARE MEDICINE  Name: Colleen [name] MRN: [id number] DOB: Nov 20, 1961    ADMISSION DATE:  09/21/2013 CONSULTATION DATE:  09/22/2013  REFERRING MD :  Dr. Stevie Kern PRIMARY SERVICE: PCCM  CHIEF COMPLAINT:  Weakness and vomiting  BRIEF PATIENT DESCRIPTION: 52 y/o female [facility] with weakness and emesis found to be in DKA vs HONC with shock requiring pressors.   SIGNIFICANT EVENTS / STUDIES:  10/15 [facility] to [facility], given IV fluids, IV insulin, and pressors 10/16 transferred to [facility] ICU 10/16 pulled out L IJ  LINES / TUBES: L IJ 10/16>>> Peripheral IV X 2  CULTURES: Blood 10/15>>> E coli Urine 10/15>>> E coli MRSA PCR 10/15>>>neg Cdiff 10/16 >> neg GI pathogen panel 10/16>> NEG  ANTIBIOTICS: Ceftriaxone 10/15>>>10/16, 10-18>> Vancomycin 10/16>>>10-17 Zosyn 1016>>>10-18  SUBJECTIVE: Tachypneic. Denies pain. Appears very frail  VITAL SIGNS: Temp:  [98.8 F (37.1 C)-102.1 F (38.9 C)] 100.1 F (37.8 C) (10/18 1000) Pulse Rate:  [111-130] 113 (10/18 1000) Resp:  [18-40] 20 (10/18 1000) BP: (94-126)/(55-80) 108/64 mmHg (10/18 1000) SpO2:  [94 %-100 %] 96 % (10/18 1000) Arterial Line BP: (90-119)/(47-62) 98/52 mmHg (10/18 1000) HEMODYNAMICS:   VENTILATOR SETTINGS:   INTAKE / OUTPUT: Intake/Output     10/17 0701 - 10/18 0700 10/18 0701 - 10/19 0700   I.V. (mL/kg) 1918.1 (25.9) 4.9 (0.1)   IV Piggyback 137.5    Total Intake(mL/kg) 2055.6 (27.8) 4.9 (0.1)   Urine (mL/kg/hr) 1165 (0.7)    Stool 500 (0.3)    Total Output 1665     Net +390.6 +4.9          PHYSICAL EXAMINATION: Gen: NAD, alert, cooperative with exam HEENT: NCAT, EOMI, PERRL, R facial droop CV: RRR, good S1/S2, no murmur Resp: crackles throughout, some exp wheezes, decreased air movement at the bases BL,  Abd: SNTND, BS present, no guarding or organomegaly Ext: 1+ pitting edema, 2+ DP pusles Neuro: Alert, interactive Skin:  intact  LABS:  CBC Recent Labs     09/21/13  2250  09/22/13  0713  09/23/13  0400  09/23/13  0907  WBC  16.9*  17.4*  19.0*   --   HGB  12.1  11.1*  11.7*   --   HCT  35.6*  32.4*  32.9*   --   PLT  152  95*  62*  58*   Coag's Recent Labs     09/22/13  1151  09/23/13  0907  APTT  38*  49*  INR  1.61*  1.62*   BMET Recent Labs     09/23/13  2333  09/24/13  0430  09/24/13  0800  NA  138  136  135  K  3.4*  3.2*  3.1*  CL  107  103  102  CO2  18*  19  21  BUN  24*  24*  25*  CREATININE  2.38*  2.40*  2.39*  GLUCOSE  164*  152*  139*   Electrolytes Recent Labs     09/23/13  2333  09/24/13  0430  09/24/13  0800  CALCIUM  7.1*  7.1*  7.1*   Sepsis Markers Recent Labs     09/22/13  0950  09/23/13  0400  09/24/13  0430  PROCALCITON  122.89  82.00  40.95   ABG Recent Labs     09/23/13  0840  PHART  7.406  PCO2ART  25.1*  PO2ART  78.4*  Liver Enzymes Recent Labs     09/21/13  2250  AST  15  ALT  8  ALKPHOS  173*  BILITOT  0.3  ALBUMIN  2.8*   Cardiac Enzymes Recent Labs     09/22/13  0800  TROPONINI  <0.30   Glucose Recent Labs     09/24/13  0455  09/24/13  0551  09/24/13  0630  09/24/13  0723  09/24/13  0811  09/24/13  0917  GLUCAP  151*  146*  137*  139*  141*  146*    Imaging Dg Chest Port 1 View  09/23/2013   CLINICAL DATA:  Pulmonary edema.  EXAM: PORTABLE CHEST - 1 VIEW  COMPARISON:  09/22/2013  FINDINGS: Left-sided central line has been removed.  Lung apices not entirely included on present exam limiting evaluation for detection of small pneumothorax. No gross pneumothorax detected.  Slightly asymmetric airspace disease suggestive of pulmonary edema although infectious process not excluded in the proper clinical setting.  Heart size top-normal  IMPRESSION: Slightly asymmetric diffuse airspace disease similar to prior exam. This may represent pulmonary edema although infectious infiltrate could not be excluded in the proper  clinical setting.  Left central line removed  Lung apices not entirely included on present exam.   Electronically Signed   By: Chauncey Cruel M.D.   On: 09/23/2013 08:01   CXR:see above  ASSESSMENT / PLAN:  PULMONARY A: Acute respiratory failure Tachypnea ALI/edema CXR pattern P:   Supplemental O2 Monitor closely   CARDIOVASCULAR A:  Shock, Sepsis, hypovolemic, and cardiogenic Cardiomyopathy, LVEF 30-35% Troponin neg X 1 P:  Limit fluids as possible, difficult with DKA vs HHONK Serial lactate  Continue bicarb gtt, stop d50.45NS  RENAL A:   AKI  Suspect CKD Hyponatremia, resolved AG Metabolic Acidosis, resolved P:   Monitor BMET intermittently Correct electrolytes as indicated   GASTROINTESTINAL A:   Diarrhea, vomiting P:   See ID D/C PPI as possible contributor to diarrhea SUP N/I post extubation  HEMATOLOGIC A:   Thrombocytopenia - probable compensated DIC P:  DVT px: SCDs Avoid all heaprins Monitor CBC intermittently  INFECTIOUS A:   Severe sepsis Leukocytosis E coli bacteremia due to Urinary source Severe watery diarrhea, C diff negative P:   Micro and abx as above  ENDOCRINE A:   DM2   HONK/DKA resolved P:   Transition to SSI D/C HCO3 infusion  NEUROLOGIC A:   Acute enceph - mild P:   Routine monitoring Avoid BZDs  Todays summary: de intensify care .  But leave in icu.  Richardson Landry Minor ACNP Mary[facility] Shape PCCM Pager [phone] till 3 pm If no answer page [phone] 09/24/2013, 11:57 AM   Merton Border, MD ; [facility] service Mobile [phone].  After 5:30 PM or weekends, call [phone]

## 2013-09-24 NOTE — Progress Notes (Signed)
Respirations 40's to 50's. Denies chest pain. 02 sats 89 to 90 % on 6 Liters nasal cannula. Dr Earnest Conroy made aware. See orders

## 2013-09-24 NOTE — Progress Notes (Signed)
Anion gap 14

## 2013-09-24 NOTE — Progress Notes (Signed)
Right radial Aline dcd per Dr Alva Garnet order.patient tolerated procedure well. Site with pressure dressing. Aline cath intact upon removal

## 2013-09-24 NOTE — Progress Notes (Signed)
Does not meet inclusion criteria for electrolyte replacement protocol due to GFR 26. K 3.2 reported to eMD.

## 2013-09-24 NOTE — Procedures (Signed)
Central Venous Catheter Insertion Procedure Note NORMA ZAKARIAN [id number] 23-Dec-1960  Procedure: Insertion of Central Venous Catheter Indications: Assessment of intravascular volume, Drug and/or fluid administration and Frequent blood sampling  Procedure Details Consent: Risks of procedure as well as the alternatives and risks of each were explained to the (patient/caregiver).  Consent for procedure obtained. Time Out: Verified patient identification, verified procedure, site/side was marked, verified correct patient position, special equipment/implants available, medications/allergies/relevent history reviewed, required imaging and test results available.  Performed  Maximum sterile technique was used including antiseptics, cap, gloves, gown, hand hygiene, mask and sheet. Skin prep: Chlorhexidine; local anesthetic administered A antimicrobial bonded/coated triple lumen catheter was placed in the left subclavian vein using the Seldinger technique.  Evaluation Blood flow good Complications: No apparent complications Patient did tolerate procedure well. Chest X-ray ordered to verify placement.  CXR: normal.  Waynetta Pean, M.D. Pulmonary and Critical Care Medicine Call [place] with questions [phone] 09/24/2013, 9:22 PM

## 2013-09-25 ENCOUNTER — Inpatient Hospital Stay (HOSPITAL_COMMUNITY): Payer: Federal, State, Local not specified - PPO

## 2013-09-25 DIAGNOSIS — R7881 Bacteremia: Secondary | ICD-10-CM | POA: Diagnosis present

## 2013-09-25 DIAGNOSIS — J9601 Acute respiratory failure with hypoxia: Secondary | ICD-10-CM | POA: Diagnosis present

## 2013-09-25 DIAGNOSIS — A419 Sepsis, unspecified organism: Secondary | ICD-10-CM | POA: Diagnosis present

## 2013-09-25 LAB — CBC
HCT: 27.3 % — ABNORMAL LOW (ref 36.0–46.0)
Hemoglobin: 9.8 g/dL — ABNORMAL LOW (ref 12.0–15.0)
MCH: 24.4 pg — ABNORMAL LOW (ref 26.0–34.0)
MCHC: 35.9 g/dL (ref 30.0–36.0)
MCV: 67.9 fL — ABNORMAL LOW (ref 78.0–100.0)
Platelets: 77 10*3/uL — ABNORMAL LOW (ref 150–400)
RBC: 4.02 MIL/uL (ref 3.87–5.11)
RDW: 15.6 % — ABNORMAL HIGH (ref 11.5–15.5)
WBC: 16.1 10*3/uL — ABNORMAL HIGH (ref 4.0–10.5)

## 2013-09-25 LAB — BASIC METABOLIC PANEL
BUN: 26 mg/dL — ABNORMAL HIGH (ref 6–23)
BUN: 27 mg/dL — ABNORMAL HIGH (ref 6–23)
BUN: 32 mg/dL — ABNORMAL HIGH (ref 6–23)
CO2: 20 mEq/L (ref 19–32)
CO2: 18 mEq/L — ABNORMAL LOW (ref 19–32)
CO2: 16 mEq/L — ABNORMAL LOW (ref 19–32)
Calcium: 7.1 mg/dL — ABNORMAL LOW (ref 8.4–10.5)
Calcium: 7.2 mg/dL — ABNORMAL LOW (ref 8.4–10.5)
Calcium: 7.3 mg/dL — ABNORMAL LOW (ref 8.4–10.5)
Chloride: 105 mEq/L (ref 96–112)
Chloride: 106 mEq/L (ref 96–112)
Chloride: 106 mEq/L (ref 96–112)
Creatinine, Ser: 2.14 mg/dL — ABNORMAL HIGH (ref 0.50–1.10)
Creatinine, Ser: 2.15 mg/dL — ABNORMAL HIGH (ref 0.50–1.10)
Creatinine, Ser: 2.11 mg/dL — ABNORMAL HIGH (ref 0.50–1.10)
GFR calc Af Amer: 30 mL/min — ABNORMAL LOW (ref >90)
GFR calc Af Amer: 29 mL/min — ABNORMAL LOW (ref >90)
GFR calc Af Amer: 30 mL/min — ABNORMAL LOW (ref >90)
GFR calc non Af Amer: 26 mL/min — ABNORMAL LOW (ref >90)
GFR calc non Af Amer: 25 mL/min — ABNORMAL LOW (ref >90)
GFR calc non Af Amer: 26 mL/min — ABNORMAL LOW (ref >90)
Glucose, Bld: 142 mg/dL — ABNORMAL HIGH (ref 70–99)
Glucose, Bld: 131 mg/dL — ABNORMAL HIGH (ref 70–99)
Glucose, Bld: 164 mg/dL — ABNORMAL HIGH (ref 70–99)
Potassium: 4.2 mEq/L (ref 3.5–5.1)
Potassium: 3.8 mEq/L (ref 3.5–5.1)
Potassium: 4.5 mEq/L (ref 3.5–5.1)
Sodium: 138 mEq/L (ref 135–145)
Sodium: 137 mEq/L (ref 135–145)
Sodium: 136 mEq/L (ref 135–145)

## 2013-09-25 LAB — POCT I-STAT 3, ART BLOOD GAS (G3+)
Acid-base deficit: 3 mmol/L — ABNORMAL HIGH (ref 0.0–2.0)
Acid-base deficit: 7 mmol/L — ABNORMAL HIGH (ref 0.0–2.0)
Bicarbonate: 21.8 mEq/L (ref 20.0–24.0)
Bicarbonate: 18 mEq/L — ABNORMAL LOW (ref 20.0–24.0)
O2 Saturation: 88 %
O2 Saturation: 90 %
Patient temperature: 98.6
Patient temperature: 100.6
TCO2: 23 mmol/L (ref 0–100)
TCO2: 19 mmol/L (ref 0–100)
pCO2 arterial: 39.4 mmHg (ref 35.0–45.0)
pCO2 arterial: 33.9 mmHg — ABNORMAL LOW (ref 35.0–45.0)
pH, Arterial: 7.351 (ref 7.350–7.450)
pH, Arterial: 7.338 — ABNORMAL LOW (ref 7.350–7.450)
pO2, Arterial: 56 mmHg — ABNORMAL LOW (ref 80.0–100.0)
pO2, Arterial: 65 mmHg — ABNORMAL LOW (ref 80.0–100.0)

## 2013-09-25 LAB — GLUCOSE, CAPILLARY
Glucose-Capillary: 105 mg/dL — ABNORMAL HIGH (ref 70–99)
Glucose-Capillary: 112 mg/dL — ABNORMAL HIGH (ref 70–99)
Glucose-Capillary: 124 mg/dL — ABNORMAL HIGH (ref 70–99)
Glucose-Capillary: 131 mg/dL — ABNORMAL HIGH (ref 70–99)
Glucose-Capillary: 162 mg/dL — ABNORMAL HIGH (ref 70–99)
Glucose-Capillary: 185 mg/dL — ABNORMAL HIGH (ref 70–99)

## 2013-09-25 LAB — CARBOXYHEMOGLOBIN
Carboxyhemoglobin: 1.9 % — ABNORMAL HIGH (ref 0.5–1.5)
Methemoglobin: 1.5 % (ref 0.0–1.5)
O2 Saturation: 66.8 %
Total hemoglobin: 10.1 g/dL — ABNORMAL LOW (ref 12.0–16.0)

## 2013-09-25 MED ORDER — NOREPINEPHRINE BITARTRATE 1 MG/ML IJ SOLN
2.0000 ug/min | INTRAVENOUS | Status: DC
  Administered 2013-09-25 (×2): 2 ug/min via INTRAVENOUS
  Filled 2013-09-25: qty 4

## 2013-09-25 MED ORDER — SODIUM CHLORIDE 0.9 % IV SOLN: INTRAVENOUS | Status: DC

## 2013-09-25 MED ORDER — SODIUM CHLORIDE 0.9 % IV SOLN
Freq: Once | INTRAVENOUS | Status: AC
  Administered 2013-09-25: 13:00:00 via INTRAVENOUS

## 2013-09-25 MED ORDER — DEXMEDETOMIDINE HCL IN NACL 400 MCG/100ML IV SOLN
0.4000 ug/kg/h | INTRAVENOUS | Status: DC
  Administered 2013-09-25 (×2): 0.8 ug/kg/h via INTRAVENOUS
  Administered 2013-09-25: 0.7 ug/kg/h via INTRAVENOUS
  Administered 2013-09-26: 1.2 ug/kg/h via INTRAVENOUS
  Administered 2013-09-26: 0.8 ug/kg/h via INTRAVENOUS
  Filled 2013-09-25 (×4): qty 100

## 2013-09-25 MED ORDER — DEXMEDETOMIDINE HCL IN NACL 200 MCG/50ML IV SOLN
0.4000 ug/kg/h | INTRAVENOUS | Status: DC
  Administered 2013-09-25: 0.4 ug/kg/h via INTRAVENOUS
  Filled 2013-09-25 (×3): qty 50

## 2013-09-25 MED ORDER — PROMOTE PO LIQD
1000.0000 mL | ORAL | Status: DC
  Administered 2013-09-25: 1000 mL
  Filled 2013-09-25 (×3): qty 1000

## 2013-09-25 MED ORDER — ACETAMINOPHEN 160 MG/5ML PO SOLN
650.0000 mg | Freq: Four times a day (QID) | ORAL | Status: DC | PRN
  Administered 2013-09-25 – 2013-09-26 (×2): 650 mg
  Filled 2013-09-25 (×2): qty 20.3

## 2013-09-25 NOTE — Progress Notes (Signed)
[place] Progress Note Patient Name: Colleen [name] DOB: 1961-08-10 MRN: [id number]  Date of Service  09/25/2013   HPI/Events of Note   Hypotensive inspite of fluid bous & backing off precedex  eICU Interventions  Start levopedh  gtt   Intervention Category Intermediate Interventions: Hypotension - evaluation and management  ALVA,RAKESH V. 09/25/2013, 5:42 PM

## 2013-09-25 NOTE — Progress Notes (Signed)
BP dropped in the 27' after predex started. Heart rate 90's sinus rhythm. Richardson Landry Minor notified. Orders received

## 2013-09-25 NOTE — Progress Notes (Signed)
Brief Nutrition Note  Consult received for enteral/tube feeding initiation and management.  Adult Enteral Nutrition Protocol initiated. Full assessment to follow.  Admitting Dx: DKA (diabetic ketoacidoses) [250.10] UTI (urinary tract infection) [599.0] Vomiting and diarrhea [787.03, 787.91] Sepsis [038.9, 995.91]  Body mass index is 31.47 kg/(m^2). Pt meets criteria for obesity based on current BMI.  Labs:   Recent Labs Lab 09/24/13 1850 09/25/13 0018 09/25/13 1218  NA 136 138 137  K 3.6 4.2 3.8  CL 103 105 106  CO2 20 20 18*  BUN 25* 26* 27*  CREATININE 2.29* 2.14* 2.15*  CALCIUM 7.3* 7.1* 7.2*  GLUCOSE 181* 142* 131*    Terrace Arabia RD, LDN

## 2013-09-25 NOTE — Progress Notes (Signed)
PULMONARY  / CRITICAL CARE MEDICINE  Name: Colleen [name] MRN: [id number] DOB: [birth year]    ADMISSION DATE:  09/21/2013 CONSULTATION DATE:  09/22/2013  REFERRING MD :  Dr. Stevie Kern PRIMARY SERVICE: PCCM  CHIEF COMPLAINT:  Weakness and vomiting  BRIEF PATIENT DESCRIPTION: 52 y/o female [facility] with weakness and emesis found to be in DKA vs HONC with shock requiring pressors. Intubated 10-18SIGNIFICANT EVENTS / STUDIES:  10/15 presented to [facility], given IV fluids, IV insulin, and pressors 10/16 transferred to [facility] ICU 10/16 pulled out L IJ 10-18 intubated for resp failure  LINES / TUBES: L IJ 10/16>>>16 Peripheral IV X 2 10-17 l [place] cvl>> 10-18 r rad a line>> 10-18 ET>>  CULTURES: Blood 10/15>>> E coli Urine 10/15>>> E coli MRSA PCR 10/15>>>neg Cdiff 10/16 >> neg GI pathogen panel 10/16>> NEG  ANTIBIOTICS: Ceftriaxone 10/15>>>10/16, 10-18>> Vancomycin 10/16>>>10-17 Zosyn 1016>>>10-18  SUBJECTIVE: Sedated on vent  VITAL SIGNS: Temp:  [99.6 F (37.6 C)-101.7 F (38.7 C)] 100.6 F (38.1 C) (10/19 1000) Pulse Rate:  [36-124] 111 (10/19 1000) Resp:  [24-50] 39 (10/19 1000) BP: (85-130)/(54-85) 103/64 mmHg (10/19 1000) SpO2:  [87 %-100 %] 91 % (10/19 1000) Arterial Line BP: (92-123)/(48-67) 108/58 mmHg (10/19 1000) FiO2 (%):  [60 %-100 %] 70 % (10/19 0836) Weight:  [183 lb 6.8 oz (83.2 kg)] 183 lb 6.8 oz (83.2 kg) (10/19 0500) HEMODYNAMICS: CVP:  [10 mmHg-15 mmHg] 15 mmHg VENTILATOR SETTINGS: Vent Mode:  [-] PRVC FiO2 (%):  [60 %-100 %] 70 % Set Rate:  [15 bmp-22 bmp] 22 bmp Vt Set:  [330 mL-450 mL] 330 mL PEEP:  [5 cmH20-10 cmH20] 10 cmH20 Plateau Pressure:  [25 cmH20-30 cmH20] 30 cmH20 INTAKE / OUTPUT: Intake/Output     10/18 0701 - 10/19 0700 10/19 0701 - 10/20 0700   I.V. (mL/kg) 210.5 (2.5) 63.3 (0.8)   IV Piggyback 280 60   Total Intake(mL/kg) 490.5 (5.9) 123.3 (1.5)   Urine (mL/kg/hr) 1250 (0.6) 100 (0.3)   Stool 1100 (0.6)    Total Output 2350 100   Net -1859.5 +23.3          PHYSICAL EXAMINATION: Gen: sedated on vent HEENT: NCAT, EOMI, PERRL,  CV: RRR, Resp: decreased air movent ++rhonchi. ards protocol Abd: SNTND, BS present, no guarding or organomegaly Ext: 1+ pitting edema, 2+ DP pusles Neuro: Alert, interactive Skin: intact  LABS:  CBC Recent Labs     09/23/13  0400  09/23/13  0907  09/25/13  0400  WBC  19.0*   --   16.1*  HGB  11.7*   --   9.8*  HCT  32.9*   --   27.3*  PLT  62*  58*  77*   Coag's Recent Labs     09/22/13  1151  09/23/13  0907  APTT  38*  49*  INR  1.61*  1.62*   BMET Recent Labs     09/24/13  0800  09/24/13  1850  09/25/13  0018  NA  135  136  138  K  3.1*  3.6  4.2  CL  102  103  105  CO2  21  20  20   BUN  25*  25*  26*  CREATININE  2.39*  2.29*  2.14*  GLUCOSE  139*  181*  142*   Electrolytes Recent Labs     09/24/13  0800  09/24/13  1850  09/25/13  0018  CALCIUM  7.1*  7.3*  7.1*   Sepsis Markers Recent Labs     09/23/13  0400  09/24/13  0430  PROCALCITON  82.00  40.95   ABG Recent Labs     09/24/13  1730  09/24/13  2218  09/25/13  0337  PHART  7.478*  7.326*  7.351  PCO2ART  26.5*  36.3  39.4  PO2ART  51.1*  86.0  56.0*   Liver Enzymes No results found for this basename: AST, ALT, ALKPHOS, BILITOT, ALBUMIN,  in the last 72 hours Cardiac Enzymes No results found for this basename: TROPONINI, PROBNP,  in the last 72 hours Glucose Recent Labs     09/24/13  1426  09/24/13  1555  09/24/13  2036  09/25/13  0021  09/25/13  0354  09/25/13  0712  GLUCAP  164*  187*  180*  185*  131*  112*    Imaging Dg Chest Port 1 View  09/25/2013   CLINICAL DATA:  Respiratory failure.  EXAM: PORTABLE CHEST - 1 VIEW  COMPARISON:  09/24/2013.  FINDINGS: The support apparatus is stable. The cardiac silhouette, mediastinal and hilar contours are unchanged. There is a persistent diffuse airspace process.  IMPRESSION: Stable  support apparatus.  Persistent diffuse airspace process.   Electronically Signed   By: Kalman Jewels M.D.   On: 09/25/2013 08:00   Dg Chest Port 1 View  09/24/2013   CLINICAL DATA:  Central line placement.  EXAM: PORTABLE CHEST - 1 VIEW  COMPARISON:  09/24/2013  FINDINGS: Left subclavian central line is in place with the tip at the cavoatrial junction. No pneumothorax. Diffuse bilateral airspace opacities again noted, unchanged. No visible effusions. Heart is normal size. Endotracheal tube is 3 cm above the carina.  IMPRESSION: Endotracheal tube 3 cm above the carina. Left subclavian central line tip at the cavoatrial junction. No pneumothorax. Continued diffuse bilateral airspace disease.   Electronically Signed   By: Rolm Baptise M.D.   On: 09/24/2013 21:29   Dg Chest Port 1 View  09/24/2013   CLINICAL DATA:  Low O2 sats.  EXAM: PORTABLE CHEST - 1 VIEW  COMPARISON:  09/23/2013  FINDINGS: Diffuse airspace opacities, worsened since prior study. This could represent edema, hemorrhage or infection. Heart is upper limits normal in size. No visible effusions.  IMPRESSION: Worsening diffuse bilateral airspace disease.   Electronically Signed   By: Rolm Baptise M.D.   On: 09/24/2013 17:27   CXR:see above  ASSESSMENT / PLAN:  PULMONARY A: Acute respiratory failure Tachypnea ALI/edema CXR pattern Intubated 10-18 with ards pattern P:   ards protocol Bd's Wean as tolerate Change sedation to precedex   CARDIOVASCULAR A:  Shock, Sepsis, hypovolemic, and cardiogenic Cardiomyopathy, LVEF 30-35% Troponin neg X 1(10-19 shock resolved) P:  Limit fluids as possible, difficult with DKA vs HHONK Serial lactate    RENAL Lab Results  Component Value Date   CREATININE 2.14* 09/25/2013   CREATININE 2.29* 09/24/2013   CREATININE 2.39* 09/24/2013     A:   AKI  Suspect CKD Hyponatremia, resolved AG Metabolic Acidosis, resolved P:   Monitor BMET intermittently Correct electrolytes as  indicated   GASTROINTESTINAL A:   Diarrhea, vomiting P:   See ID D/C PPI as possible contributor to diarrhea SUP N/I post extubation Start tf 10-19 monitor diarrhea  HEMATOLOGIC A:   Thrombocytopenia - probable compensated DIC P:  DVT px: SCDs Avoid all heaprins Monitor CBC intermittently  INFECTIOUS A:   Severe sepsis Leukocytosis E coli bacteremia due to  Urinary source Severe watery diarrhea, C diff negative P:   Micro and abx as above  ENDOCRINE A:   DM2   HONK/DKA resolved P:   Transition to SSI D/C HCO3 infusion Replete lytes Start tube feeds  NEUROLOGIC A:   Acute enceph - mild P:   Currently intubated and sedated. Change diprivan to precedex   Todays summary: 10-19 required intubation overnight.  Richardson Landry Minor ACNP Maryanna Shape PCCM Pager [phone] till 3 pm If no answer page [phone] 09/25/2013, 10:55 AM  I have interviewed and examined the patient and reviewed the database. I have formulated the assessment and plan as reflected in the note above with amendments made by me. 40 mins of direct critical care time provided  Merton Border, MD;  PCCM service; [place] [phone]

## 2013-09-26 ENCOUNTER — Inpatient Hospital Stay (HOSPITAL_COMMUNITY): Payer: Federal, State, Local not specified - PPO

## 2013-09-26 LAB — CBC
HCT: 27.4 % — ABNORMAL LOW (ref 36.0–46.0)
Hemoglobin: 9.4 g/dL — ABNORMAL LOW (ref 12.0–15.0)
MCH: 23.5 pg — ABNORMAL LOW (ref 26.0–34.0)
MCHC: 34.3 g/dL (ref 30.0–36.0)
MCV: 68.5 fL — ABNORMAL LOW (ref 78.0–100.0)
Platelets: 58 10*3/uL — ABNORMAL LOW (ref 150–400)
RBC: 4 MIL/uL (ref 3.87–5.11)
RDW: 16 % — ABNORMAL HIGH (ref 11.5–15.5)
WBC: 14.3 10*3/uL — ABNORMAL HIGH (ref 4.0–10.5)

## 2013-09-26 LAB — BASIC METABOLIC PANEL
BUN: 37 mg/dL — ABNORMAL HIGH (ref 6–23)
BUN: 38 mg/dL — ABNORMAL HIGH (ref 6–23)
BUN: 37 mg/dL — ABNORMAL HIGH (ref 6–23)
CO2: 17 mEq/L — ABNORMAL LOW (ref 19–32)
CO2: 16 mEq/L — ABNORMAL LOW (ref 19–32)
CO2: 18 mEq/L — ABNORMAL LOW (ref 19–32)
Calcium: 7.3 mg/dL — ABNORMAL LOW (ref 8.4–10.5)
Calcium: 7.2 mg/dL — ABNORMAL LOW (ref 8.4–10.5)
Calcium: 7.3 mg/dL — ABNORMAL LOW (ref 8.4–10.5)
Chloride: 106 mEq/L (ref 96–112)
Chloride: 106 mEq/L (ref 96–112)
Chloride: 109 mEq/L (ref 96–112)
Creatinine, Ser: 2.28 mg/dL — ABNORMAL HIGH (ref 0.50–1.10)
Creatinine, Ser: 2.19 mg/dL — ABNORMAL HIGH (ref 0.50–1.10)
Creatinine, Ser: 2.09 mg/dL — ABNORMAL HIGH (ref 0.50–1.10)
GFR calc Af Amer: 27 mL/min — ABNORMAL LOW (ref >90)
GFR calc Af Amer: 29 mL/min — ABNORMAL LOW (ref >90)
GFR calc Af Amer: 30 mL/min — ABNORMAL LOW (ref >90)
GFR calc non Af Amer: 24 mL/min — ABNORMAL LOW (ref >90)
GFR calc non Af Amer: 25 mL/min — ABNORMAL LOW (ref >90)
GFR calc non Af Amer: 26 mL/min — ABNORMAL LOW (ref >90)
Glucose, Bld: 200 mg/dL — ABNORMAL HIGH (ref 70–99)
Glucose, Bld: 217 mg/dL — ABNORMAL HIGH (ref 70–99)
Glucose, Bld: 171 mg/dL — ABNORMAL HIGH (ref 70–99)
Potassium: 4.4 mEq/L (ref 3.5–5.1)
Potassium: 4.3 mEq/L (ref 3.5–5.1)
Potassium: 4.4 mEq/L (ref 3.5–5.1)
Sodium: 136 mEq/L (ref 135–145)
Sodium: 137 mEq/L (ref 135–145)
Sodium: 139 mEq/L (ref 135–145)

## 2013-09-26 LAB — POCT I-STAT 3, ART BLOOD GAS (G3+)
Acid-base deficit: 9 mmol/L — ABNORMAL HIGH (ref 0.0–2.0)
Acid-base deficit: 9 mmol/L — ABNORMAL HIGH (ref 0.0–2.0)
Acid-base deficit: 11 mmol/L — ABNORMAL HIGH (ref 0.0–2.0)
Bicarbonate: 15.9 mEq/L — ABNORMAL LOW (ref 20.0–24.0)
Bicarbonate: 16.8 mEq/L — ABNORMAL LOW (ref 20.0–24.0)
Bicarbonate: 15.3 mEq/L — ABNORMAL LOW (ref 20.0–24.0)
O2 Saturation: 86 %
O2 Saturation: 95 %
O2 Saturation: 99 %
Patient temperature: 101
Patient temperature: 99.3
Patient temperature: 99.2
TCO2: 17 mmol/L (ref 0–100)
TCO2: 18 mmol/L (ref 0–100)
TCO2: 16 mmol/L (ref 0–100)
pCO2 arterial: 31.8 mmHg — ABNORMAL LOW (ref 35.0–45.0)
pCO2 arterial: 37.9 mmHg (ref 35.0–45.0)
pCO2 arterial: 35.9 mmHg (ref 35.0–45.0)
pH, Arterial: 7.312 — ABNORMAL LOW (ref 7.350–7.450)
pH, Arterial: 7.257 — ABNORMAL LOW (ref 7.350–7.450)
pH, Arterial: 7.238 — ABNORMAL LOW (ref 7.350–7.450)
pO2, Arterial: 60 mmHg — ABNORMAL LOW (ref 80.0–100.0)
pO2, Arterial: 88 mmHg (ref 80.0–100.0)
pO2, Arterial: 141 mmHg — ABNORMAL HIGH (ref 80.0–100.0)

## 2013-09-26 LAB — GLUCOSE, CAPILLARY
Glucose-Capillary: 155 mg/dL — ABNORMAL HIGH (ref 70–99)
Glucose-Capillary: 161 mg/dL — ABNORMAL HIGH (ref 70–99)
Glucose-Capillary: 166 mg/dL — ABNORMAL HIGH (ref 70–99)
Glucose-Capillary: 168 mg/dL — ABNORMAL HIGH (ref 70–99)
Glucose-Capillary: 219 mg/dL — ABNORMAL HIGH (ref 70–99)
Glucose-Capillary: 229 mg/dL — ABNORMAL HIGH (ref 70–99)

## 2013-09-26 LAB — HEPARIN INDUCED THROMBOCYTOPENIA PNL
Heparin Induced Plt Ab: NEGATIVE
Patient O.D.: 0.025
UFH High Dose UFH H: 0 % Release
UFH Low Dose 0.1 IU/mL: 0 % Release
UFH Low Dose 0.5 IU/mL: 0 % Release
UFH SRA Result: NEGATIVE

## 2013-09-26 LAB — LACTIC ACID, PLASMA: Lactic Acid, Venous: 1.3 mmol/L (ref 0.5–2.2)

## 2013-09-26 LAB — PROCALCITONIN: Procalcitonin: 10.92 ng/mL

## 2013-09-26 MED ORDER — SODIUM CHLORIDE 0.9 % IV SOLN
1.0000 mg/h | INTRAVENOUS | Status: DC
  Administered 2013-09-26: 6 mg/h via INTRAVENOUS
  Administered 2013-09-26: 4 mg/h via INTRAVENOUS
  Administered 2013-09-27: 6 mg/h via INTRAVENOUS
  Filled 2013-09-26 (×4): qty 10

## 2013-09-26 MED ORDER — FAMOTIDINE 40 MG/5ML PO SUSR
20.0000 mg | Freq: Every day | ORAL | Status: DC
  Administered 2013-09-26 – 2013-09-27 (×2): 20 mg via ORAL
  Filled 2013-09-26 (×2): qty 2.5

## 2013-09-26 MED ORDER — PRO-STAT SUGAR FREE PO LIQD
30.0000 mL | Freq: Four times a day (QID) | ORAL | Status: DC
  Administered 2013-09-26 – 2013-09-29 (×12): 30 mL
  Filled 2013-09-26 (×15): qty 30

## 2013-09-26 MED ORDER — FREE WATER
100.0000 mL | Freq: Three times a day (TID) | Status: DC
  Administered 2013-09-26 – 2013-09-30 (×12): 100 mL

## 2013-09-26 MED ORDER — METOCLOPRAMIDE HCL 5 MG/ML IJ SOLN
10.0000 mg | Freq: Three times a day (TID) | INTRAMUSCULAR | Status: DC
  Administered 2013-09-26 – 2013-09-27 (×2): 10 mg via INTRAVENOUS
  Filled 2013-09-26 (×5): qty 2

## 2013-09-26 MED ORDER — OXEPA PO LIQD
1000.0000 mL | ORAL | Status: DC
  Administered 2013-09-26 – 2013-09-29 (×5): 1000 mL
  Filled 2013-09-26 (×5): qty 1000

## 2013-09-26 MED ORDER — FAMOTIDINE 20 MG PO TABS
20.0000 mg | ORAL_TABLET | Freq: Two times a day (BID) | ORAL | Status: DC
  Filled 2013-09-26 (×2): qty 1

## 2013-09-26 MED ORDER — PIPERACILLIN-TAZOBACTAM 3.375 G IVPB
3.3750 g | Freq: Three times a day (TID) | INTRAVENOUS | Status: DC
  Administered 2013-09-26 – 2013-10-01 (×15): 3.375 g via INTRAVENOUS
  Filled 2013-09-26 (×16): qty 50

## 2013-09-26 MED ORDER — SODIUM CHLORIDE 0.9 % IV SOLN
1250.0000 mg | INTRAVENOUS | Status: DC
  Administered 2013-09-26 – 2013-09-27 (×2): 1250 mg via INTRAVENOUS
  Filled 2013-09-26 (×2): qty 1250

## 2013-09-26 NOTE — Progress Notes (Signed)
Dr. Jimmy Footman made aware of SpO2 as low as 85, MD suggested for RT to perform recruitment maneuvers and ordered to increase sedation, RT completed two minute maneuver. Spo2 still low 80's.   Desmond Dike RN

## 2013-09-26 NOTE — Progress Notes (Signed)
Chaplain encountered pt's mother, Colleen [name], and boyfriend, Colleen [name], in hallway and accompanied them into pt's room. Pt had been intubated since chaplain's last visit. Pt's mother said "the infection has spread . . . to her blood."  However, both said they were not worried and the pt's boyfriend said he "trusted that God would heal her if they continued to praise him and trust him." Pt's boyfriend was speaking a lot, repeating himself, fidgeting, and insistent that "God would heal her if they had faith."   Chaplain offered empathic listening, ministry of presence, and emotional/spiritual support. Both expressed gratitude for visit. Will follow up; please page if needed/requested.   Ethelene Browns, [place]

## 2013-09-26 NOTE — Progress Notes (Signed)
INITIAL NUTRITION ASSESSMENT  DOCUMENTATION CODES Per approved criteria  -Obesity Unspecified   INTERVENTION:  Continue TF via OGT, change formula to Oxepa at 20 ml/h, increase by 10 ml every 4 hours to goal rate of 30 ml/h with Prostat 30 ml QID to provide 1480 kcals (70% of estimated needs), 105 gm protein, 565 ml free water daily.  NUTRITION DIAGNOSIS: Inadequate oral intake related to inability to eat as evidenced by NPO status.   Goal: Enteral nutrition to provide 60-70% of estimated calorie needs (22-25 kcals/kg ideal body weight) and 100% of estimated protein needs, based on ASPEN guidelines for permissive underfeeding in critically ill obese individuals.  Monitor:  TF tolerance/adequacy, weight trend, labs, vent status.  Reason for Assessment: MD Consult for TF initiation and management.  52 y.o. female  Admitting Dx: DKA vs HONC; shock  ASSESSMENT: Patient admitted from [facility] on 10/15 with weakness and emesis, found to be in DKA vs HONC with shock requiring pressors. Patient required intubation on 10/18. ARDS protocol in place. DKA has now resolved.  Patient is currently intubated on ventilator support.  MV: 14 L/min Temp:Temp (24hrs), Avg:101.1 F (38.4 C), Min:98.9 F (37.2 C), Max:101.9 F (38.8 C)   Height: Ht Readings from Last 1 Encounters:  09/21/13 5\' 4"  (1.626 m)    Weight: Wt Readings from Last 1 Encounters:  09/26/13 191 lb 9.3 oz (86.9 kg)    Ideal Body Weight: 54.5 kg  % Ideal Body Weight: 159%  Wt Readings from Last 10 Encounters:  09/26/13 191 lb 9.3 oz (86.9 kg)    Usual Body Weight: unknown  BMI:  Body mass index is 32.87 kg/(m^2). class 1 obesity  Estimated Nutritional Needs: Kcal: 2115 Protein: 109 gm Fluid: 2.1-2.2 L  Skin: no wounds  Diet Order: NPO  EDUCATION NEEDS: -Education not appropriate at this time   Intake/Output Summary (Last 24 hours) at 09/26/13 1021 Last data filed at 09/26/13 0900  Gross per 24 hour  Intake 2815.55 ml  Output    695 ml  Net 2120.55 ml    Last BM: 10/20   Labs:   Recent Labs Lab 09/25/13 1818 09/26/13 0027 09/26/13 0514  NA 136 136 137  K 4.5 4.4 4.3  CL 106 106 106  CO2 16* 17* 16*  BUN 32* 37* 38*  CREATININE 2.11* 2.28* 2.19*  CALCIUM 7.3* 7.3* 7.2*  GLUCOSE 164* 200* 217*    CBG (last 3)   Recent Labs  09/26/13 0009 09/26/13 0404 09/26/13 0821  GLUCAP 155* 229* 161*    Scheduled Meds: . albuterol  2.5 mg Nebulization Q6H  . antiseptic oral rinse  15 mL Mouth Rinse QID  . cefTRIAXone (ROCEPHIN)  IV  1 g Intravenous Q24H  . chlorhexidine  15 mL Mouth Rinse BID  . feeding supplement (PROMOTE)  1,000 mL Per Tube Q24H  . insulin aspart  0-15 Units Subcutaneous Q4H  . sodium chloride  1,000 mL Intravenous Once    Continuous Infusions: . sodium chloride 75 mL/hr at 09/25/13 1330  . dexmedetomidine 1.2 mcg/kg/hr (09/26/13 0830)  . fentaNYL infusion INTRAVENOUS 400 mcg/hr (09/26/13 0737)  . norepinephrine (LEVOPHED) Adult infusion 1.013 mcg/min (09/25/13 1900)    Past Medical History  Diagnosis Date  . Diabetes   . 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 No pertinent past surgical history.  [name], RD, LDN, [place] Pager [phone]  After Hours Pager [phone]

## 2013-09-26 NOTE — Progress Notes (Signed)
Respiratory Therapy  Recruitment maneuver done to help improve oxygenation. Sats were 83% on *0% and +14cm PEEP. Recruitment done for 1.5 mins and sats were 100%.

## 2013-09-26 NOTE — Progress Notes (Signed)
[facility] Progress Note Patient Name: Colleen [name] DOB: Jun 13, 1961 MRN: [id number]  Date of Service  09/26/2013   HPI/Events of Note   High residuals On high dose fent gtt  eICU Interventions  Start reglan Hold TFs per protocol   Intervention Category Intermediate Interventions: Other:  Tamitha Norell V. 09/26/2013, 4:53 PM

## 2013-09-26 NOTE — Progress Notes (Signed)
[facility] Progress Note Patient Name: Colleen [name] DOB: [birth year] MRN: [id number]  Date of Service  09/26/2013   HPI/Events of Note     eICU Interventions  APRV titration in response to ABG Thigh 3.5 , Tlow 0.5 Phigh 25, Plow 25, mean =24   Intervention Category Intermediate Interventions: Other:  Ellory Khurana V. 09/26/2013, 6:28 PM

## 2013-09-26 NOTE — Progress Notes (Signed)
PULMONARY  / CRITICAL CARE MEDICINE  Name: Colleen [name] MRN: [id number] DOB: Feb 28, 1961    ADMISSION DATE:  09/21/2013 CONSULTATION DATE:  09/22/2013  REFERRING MD :  Dr. Stevie Kern PRIMARY SERVICE: PCCM  CHIEF COMPLAINT:  Weakness and vomiting  BRIEF PATIENT DESCRIPTION: 52 y/o female [facility] with weakness and emesis found to be in DKA vs HONC with shock requiring pressors. Intubated 10-18SIGNIFICANT EVENTS / STUDIES:  10/15 [facility] to [facility], given IV fluids, IV insulin, and pressors 10/16 transferred to [facility] ICU 10/16 pulled out L IJ 10/18 intubated for resp failure  LINES / TUBES: L IJ 10/16>>>16 Peripheral IV X 2 10-17 l [place] cvl >> 10-18 r rad a line >> 10-18 ET>>  CULTURES: Blood 10/15>>> E coli Urine 10/15>>> E coli MRSA PCR 10/15>>>neg Cdiff 10/16 >> neg GI pathogen panel 10/16>> NEG  ANTIBIOTICS: Ceftriaxone 10/15>>>10/16, 10-18>> Vancomycin 10/16>>>10-17 Zosyn 1016>>>10-18  SUBJECTIVE: Sedated on vent, desat overnight to 80s improved for a short time with recruitment maneuvers. Levophed started around 5pm on 10/19 and titrated off now.   VITAL SIGNS: Temp:  [99.7 F (37.6 C)-101.9 F (38.8 C)] 100.2 F (37.9 C) (10/20 0730) Pulse Rate:  [88-113] 96 (10/20 0730) Resp:  [19-40] 23 (10/20 0730) BP: (74-126)/(49-77) 106/63 mmHg (10/20 0700) SpO2:  [87 %-100 %] 90 % (10/20 0730) Arterial Line BP: (80-149)/(47-76) 122/68 mmHg (10/20 0730) FiO2 (%):  [60 %-70 %] 70 % (10/20 0700) Weight:  [191 lb 9.3 oz (86.9 kg)] 191 lb 9.3 oz (86.9 kg) (10/20 0500) HEMODYNAMICS: CVP:  [10 mmHg] 10 mmHg VENTILATOR SETTINGS: Vent Mode:  [-] PRVC FiO2 (%):  [60 %-70 %] 70 % Set Rate:  [22 bmp] 22 bmp Vt Set:  [330 mL-450 mL] 450 mL PEEP:  [10 cmH20-12 cmH20] 12 cmH20 Plateau Pressure:  [26 cmH20-30 cmH20] 28 cmH20 INTAKE / OUTPUT: Intake/Output     10/19 0701 - 10/20 0700 10/20 0701 - 10/21 0700   I.V. (mL/kg) 1381.9 (15.9)     NG/GT 170    IV Piggyback 190    Total Intake(mL/kg) 1741.9 (20)    Urine (mL/kg/hr) 245 (0.1)    Stool 550 (0.3)    Total Output 795     Net +946.9            PHYSICAL EXAMINATION: Gen: sedated on vent HEENT: NCAT, EOMI, PERRL CV: RRR Resp: Diffuse rhonchi, good air movement, diminished at t he bases, on vent Abd: SNTND, BS present, no guarding or organomegaly Ext: No edema, 2+ DP pusles Neuro: Alert, interactive Skin: intact  LABS:  CBC Recent Labs     09/23/13  0907  09/25/13  0400  09/26/13  0514  WBC   --   16.1*  14.3*  HGB   --   9.8*  9.4*  HCT   --   27.3*  27.4*  PLT  58*  77*  58*   Coag's Recent Labs     09/23/13  0907  APTT  49*  INR  1.62*   BMET Recent Labs     09/25/13  1818  09/26/13  0027  09/26/13  0514  NA  136  136  137  K  4.5  4.4  4.3  CL  106  106  106  CO2  16*  17*  16*  BUN  32*  37*  38*  CREATININE  2.11*  2.28*  2.19*  GLUCOSE  164*  200*  217*  Electrolytes Recent Labs     09/25/13  1818  09/26/13  0027  09/26/13  0514  CALCIUM  7.3*  7.3*  7.2*   Sepsis Markers Recent Labs     09/24/13  0430  PROCALCITON  40.95   ABG Recent Labs     09/25/13  0337  09/25/13  1447  09/26/13  0522  PHART  7.351  7.338*  7.312*  PCO2ART  39.4  33.9*  31.8*  PO2ART  56.0*  65.0*  60.0*   Liver Enzymes No results found for this basename: AST, ALT, ALKPHOS, BILITOT, ALBUMIN,  in the last 72 hours Cardiac Enzymes No results found for this basename: TROPONINI, PROBNP,  in the last 72 hours Glucose Recent Labs     09/25/13  0712  09/25/13  1142  09/25/13  1532  09/25/13  1957  09/26/13  0009  09/26/13  0404  GLUCAP  112*  124*  105*  162*  155*  229*    Imaging Dg Chest Port 1 View  09/26/2013   ADDENDUM REPORT: 09/26/2013 07:51  ADDENDUM: Correction: Comparison exam is 09/25/2013   Electronically Signed   By: Markus Daft M.D.   On: 09/26/2013 07:51   09/26/2013   CLINICAL DATA:  Check endotracheal tube.   EXAM: PORTABLE CHEST - 1 VIEW  COMPARISON:  09/1913  FINDINGS: Endotracheal tube is 4.6 cm above the carina. Nasogastric tube extends into the abdomen. Bilateral airspace disease has minimally changed but may have slightly improved in the right lung. Heart size is grossly unchanged in size. Left subclavian central venous catheter tip is in the lower SVC. No evidence for a pneumothorax.  IMPRESSION: Persistent bilateral airspace disease. Overall, there has been minimal change in the airspace disease but it may be slightly less dense in the right lung.  Stable support apparatuses.  Electronically Signed: By: Markus Daft M.D. On: 09/26/2013 07:44   Dg Chest Port 1 View  09/25/2013   CLINICAL DATA:  Respiratory failure.  EXAM: PORTABLE CHEST - 1 VIEW  COMPARISON:  09/24/2013.  FINDINGS: The support apparatus is stable. The cardiac silhouette, mediastinal and hilar contours are unchanged. There is a persistent diffuse airspace process.  IMPRESSION: Stable support apparatus.  Persistent diffuse airspace process.   Electronically Signed   By: Kalman Jewels M.D.   On: 09/25/2013 08:00   Dg Chest Port 1 View  09/24/2013   CLINICAL DATA:  Central line placement.  EXAM: PORTABLE CHEST - 1 VIEW  COMPARISON:  09/24/2013  FINDINGS: Left subclavian central line is in place with the tip at the cavoatrial junction. No pneumothorax. Diffuse bilateral airspace opacities again noted, unchanged. No visible effusions. Heart is normal size. Endotracheal tube is 3 cm above the carina.  IMPRESSION: Endotracheal tube 3 cm above the carina. Left subclavian central line tip at the cavoatrial junction. No pneumothorax. Continued diffuse bilateral airspace disease.   Electronically Signed   By: Rolm Baptise M.D.   On: 09/24/2013 21:29   Dg Chest Port 1 View  09/24/2013   CLINICAL DATA:  Low O2 sats.  EXAM: PORTABLE CHEST - 1 VIEW  COMPARISON:  09/23/2013  FINDINGS: Diffuse airspace opacities, worsened since prior study. This could  represent edema, hemorrhage or infection. Heart is upper limits normal in size. No visible effusions.  IMPRESSION: Worsening diffuse bilateral airspace disease.   Electronically Signed   By: Rolm Baptise M.D.   On: 09/24/2013 17:27   CXR:see above  ASSESSMENT / PLAN:  PULMONARY  A: Acute respiratory failure Tachypnea ALI/edema CXR pattern Intubated 10-18 with ARDS pattern P:   ards protocol Bd's Wean as tolerate Continue Precedex  CARDIOVASCULAR A:  Shock, Sepsis, hypovolemic, and cardiogenic Cardiomyopathy, LVEF 30-35% Troponin neg X 1(10-19 shock resolved) P:  Serial lactate  Levophed as needed  A:   AKI  Suspect CKD Hyponatremia, resolved AG Metabolic Acidosis - possibly lactic acidosis P:   Monitor BMET intermittently Correct electrolytes as indicated  GASTROINTESTINAL A:   Diarrhea, vomiting P:   See ID D/C PPI as possible contributor to diarrhea SUP N/I post extubation TF, monitor diarrhea   HEMATOLOGIC A:   Thrombocytopenia - probable compensated DIC P:  DVT px: SCDs Avoid all heaprins Monitor CBC intermittently  INFECTIOUS A:   Severe sepsis Leukocytosis E coli bacteremia due to Urinary source Severe watery diarrhea, C diff negative P:   Micro and abx as above  ENDOCRINE A:   DM2   HONK/DKA resolved P:   SSI  NEUROLOGIC A:   Acute enceph - mild P:   Currently intubated and sedated. precedex and fentanyl  Todays summary: Continue to treat supportively with fluids and mech ventilation. Trend fever curve, lactic acid, and BMP as indicators of sepsis syndrome.   Laroy Apple, MD [place] Resident, PGY-2 09/26/2013, 8:06 AM  Patient seen and examined, agree with above note.  I dictated the care and orders written for this patient under my direction.  Rush Farmer, MD [phone]

## 2013-09-26 NOTE — Progress Notes (Signed)
ANTIBIOTIC CONSULT NOTE - INITIAL  Pharmacy Consult for Vanco/Zosyn Indication: endocarditis and PNA, sepsis, bacteremia  No Known Allergies  Patient Measurements: Height: 5\' 4"  (162.6 cm) Weight: 191 lb 9.3 oz (86.9 kg) IBW/kg (Calculated) : 54.7 Adjusted Body Weight:  67.5 kg  Vital Signs: Temp: 96.7 F (35.9 C) (10/20 1600) Temp src: Other (Comment) (10/20 1600) BP: 101/55 mmHg (10/20 1600) Pulse Rate: 88 (10/20 1600) Intake/Output from previous day: 10/19 0701 - 10/20 0700 In: 2691.9 [I.V.:2131.9; NG/GT:370; IV Piggyback:190] Out: 795 [Urine:245; Stool:550] Intake/Output from this shift: Total I/O In: 1587.1 [I.V.:1159.6; Other:60; NG/GT:317.5; IV Piggyback:50] Out: 415 [Urine:415]  Labs:  Recent Labs  09/25/13 0400  09/26/13 0027 09/26/13 0514 09/26/13 1216  WBC 16.1*  --   --  14.3*  --   HGB 9.8*  --   --  9.4*  --   PLT 77*  --   --  58*  --   CREATININE  --   < > 2.28* 2.19* 2.09*  < > = values in this interval not displayed. Estimated Creatinine Clearance: 34 ml/min (by C-G formula based on Cr of 2.09). No results found for this basename: VANCOTROUGH, Corlis Leak, VANCORANDOM, GENTTROUGH, GENTPEAK, GENTRANDOM, TOBRATROUGH, TOBRAPEAK, TOBRARND, AMIKACINPEAK, AMIKACINTROU, AMIKACIN,  in the last 72 hours   Microbiology: Recent Results (from the past 720 hour(s))  CULTURE, BLOOD (ROUTINE X 2)     Status: None   Collection Time    09/21/13 10:55 PM      Result Value Range Status   Specimen Description BLOOD RIGHT [facility]   Final   Special Requests BOTTLES DRAWN AEROBIC AND ANAEROBIC  [facility]   Final   Culture  Setup Time     Final   Value: 09/22/2013 04:36     Performed at [facility]   Culture     Final   Value: ESCHERICHIA COLI     Note: SUSCEPTIBILITIES PERFORMED ON PREVIOUS CULTURE WITHIN THE LAST 5 DAYS.     Note: Gram Stain Report Called to,Read Back By and Verified With: CHRIS MCKEOWN @ [id number] 09/22/13 BY KRAWS     Performed at [facility]    Report Status 09/24/2013 FINAL   Final  CULTURE, BLOOD (ROUTINE X 2)     Status: None   Collection Time    09/21/13 11:10 PM      Result Value Range Status   Specimen Description BLOOD LEFT AC   Final   Special Requests BOTTLES DRAWN AEROBIC AND ANAEROBIC [facility]   Final   Culture  Setup Time     Final   Value: 09/22/2013 04:36     Performed at [facility]   Culture     Final   Value: ESCHERICHIA COLI     Note: Gram Stain Report Called to,Read Back By and Verified With: CHRIS MCKEOWN @ [id number] 09/22/13 BY KRAWS     Performed at [facility]   Report Status 09/24/2013 FINAL   Final   Organism ID, Bacteria ESCHERICHIA COLI   Final  URINE CULTURE     Status: None   Collection Time    09/22/13 12:30 AM      Result Value Range Status   Specimen Description URINE, CLEAN CATCH   Final   Special Requests NONE   Final   Culture  Setup Time     Final   Value: 09/22/2013 05:00     Performed at [facility]     Final  Value: >=100,000 COLONIES/ML     Performed at [facility]   Culture     Final   Value: ESCHERICHIA COLI     Performed at [facility]   Report Status 09/23/2013 FINAL   Final   Organism ID, Bacteria ESCHERICHIA COLI   Final  MRSA PCR SCREENING     Status: None   Collection Time    09/22/13  5:27 AM      Result Value Range Status   MRSA by PCR NEGATIVE  NEGATIVE Final   Comment:            The GeneXpert MRSA Assay (FDA     approved for NASAL specimens     only), is one component of a     comprehensive MRSA colonization     surveillance program. It is not     intended to diagnose MRSA     infection nor to guide or     monitor treatment for     MRSA infections.  CLOSTRIDIUM DIFFICILE BY PCR     Status: None   Collection Time    09/22/13 11:45 AM      Result Value Range Status   C difficile by pcr NEGATIVE  NEGATIVE Final    Medical History: Past Medical History  Diagnosis Date  . Diabetes   . 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 Ecoli in blood and urine. Also with AKI and estimated CrCl 34.  Rocephin 10/15>>10/16; 10/18 >>10/20 Vancomycin 10/16>>10/17, 10/20>> Zosyn 1016>>10/18, 10/20>>  10/15 BCx: 2/2 ecoli- pan sens 10/15 Urine: ecoli, same as blood 10/15 MRSA PCR neg 10/16: Cdiff negative   Goal of Therapy:  Vancomycin trough level 15-20 mcg/ml  Plan:  1. Zosyn 3.375g IV q8hrs. 2. Vancomycin 1250mg  IV q24h. Trough after 3-5 doses at steady state.   Shariece Viveiros S. Alford Highland, PharmD, BCPS Clinical Staff Pharmacist Pager [phone]  Eilene Ghazi Stillinger 09/26/2013,4:40 PM

## 2013-09-27 ENCOUNTER — Inpatient Hospital Stay (HOSPITAL_COMMUNITY): Payer: Federal, State, Local not specified - PPO

## 2013-09-27 DIAGNOSIS — N289 Disorder of kidney and ureter, unspecified: Secondary | ICD-10-CM | POA: Diagnosis present

## 2013-09-27 DIAGNOSIS — J80 Acute respiratory distress syndrome: Secondary | ICD-10-CM | POA: Diagnosis present

## 2013-09-27 LAB — BASIC METABOLIC PANEL
BUN: 46 mg/dL — ABNORMAL HIGH (ref 6–23)
CO2: 17 mEq/L — ABNORMAL LOW (ref 19–32)
Calcium: 7.4 mg/dL — ABNORMAL LOW (ref 8.4–10.5)
Chloride: 111 mEq/L (ref 96–112)
Creatinine, Ser: 2.22 mg/dL — ABNORMAL HIGH (ref 0.50–1.10)
GFR calc Af Amer: 28 mL/min — ABNORMAL LOW (ref >90)
GFR calc non Af Amer: 24 mL/min — ABNORMAL LOW (ref >90)
Glucose, Bld: 255 mg/dL — ABNORMAL HIGH (ref 70–99)
Potassium: 4.3 mEq/L (ref 3.5–5.1)
Sodium: 140 mEq/L (ref 135–145)

## 2013-09-27 LAB — GLUCOSE, CAPILLARY
Glucose-Capillary: 227 mg/dL — ABNORMAL HIGH (ref 70–99)
Glucose-Capillary: 234 mg/dL — ABNORMAL HIGH (ref 70–99)
Glucose-Capillary: 277 mg/dL — ABNORMAL HIGH (ref 70–99)
Glucose-Capillary: 281 mg/dL — ABNORMAL HIGH (ref 70–99)
Glucose-Capillary: 289 mg/dL — ABNORMAL HIGH (ref 70–99)
Glucose-Capillary: 328 mg/dL — ABNORMAL HIGH (ref 70–99)

## 2013-09-27 LAB — CBC
HCT: 24.6 % — ABNORMAL LOW (ref 36.0–46.0)
HCT: 21.9 % — ABNORMAL LOW (ref 36.0–46.0)
Hemoglobin: 8.2 g/dL — ABNORMAL LOW (ref 12.0–15.0)
Hemoglobin: 7.4 g/dL — ABNORMAL LOW (ref 12.0–15.0)
MCH: 23.4 pg — ABNORMAL LOW (ref 26.0–34.0)
MCH: 23.6 pg — ABNORMAL LOW (ref 26.0–34.0)
MCHC: 33.3 g/dL (ref 30.0–36.0)
MCHC: 33.8 g/dL (ref 30.0–36.0)
MCV: 70.1 fL — ABNORMAL LOW (ref 78.0–100.0)
MCV: 70 fL — ABNORMAL LOW (ref 78.0–100.0)
Platelets: 75 10*3/uL — ABNORMAL LOW (ref 150–400)
Platelets: 69 10*3/uL — ABNORMAL LOW (ref 150–400)
RBC: 3.51 MIL/uL — ABNORMAL LOW (ref 3.87–5.11)
RBC: 3.13 MIL/uL — ABNORMAL LOW (ref 3.87–5.11)
RDW: 15.8 % — ABNORMAL HIGH (ref 11.5–15.5)
RDW: 16.1 % — ABNORMAL HIGH (ref 11.5–15.5)
WBC: 13.8 10*3/uL — ABNORMAL HIGH (ref 4.0–10.5)
WBC: 16.9 10*3/uL — ABNORMAL HIGH (ref 4.0–10.5)

## 2013-09-27 LAB — PROCALCITONIN: Procalcitonin: 11.62 ng/mL

## 2013-09-27 LAB — LACTIC ACID, PLASMA: Lactic Acid, Venous: 1.2 mmol/L (ref 0.5–2.2)

## 2013-09-27 MED ORDER — HYDROCORTISONE SOD SUCCINATE 100 MG IJ SOLR
100.0000 mg | Freq: Four times a day (QID) | INTRAMUSCULAR | Status: DC
  Administered 2013-09-27 – 2013-09-28 (×3): 100 mg via INTRAVENOUS
  Filled 2013-09-27 (×7): qty 2

## 2013-09-27 MED ORDER — INSULIN GLARGINE 100 UNIT/ML ~~LOC~~ SOLN
10.0000 [IU] | Freq: Every day | SUBCUTANEOUS | Status: DC
  Administered 2013-09-27: 10 [IU] via SUBCUTANEOUS
  Filled 2013-09-27: qty 0.1

## 2013-09-27 MED ORDER — SODIUM CHLORIDE 0.9 % IV SOLN
INTRAVENOUS | Status: DC
  Administered 2013-09-27: 2.6 [IU]/h via INTRAVENOUS
  Administered 2013-09-28: 5.6 [IU]/h via INTRAVENOUS
  Administered 2013-09-28: 9.3 [IU]/h via INTRAVENOUS
  Filled 2013-09-27 (×3): qty 1

## 2013-09-27 MED ORDER — INSULIN ASPART 100 UNIT/ML ~~LOC~~ SOLN: 3.0000 [IU] | SUBCUTANEOUS | Status: DC

## 2013-09-27 MED ORDER — SODIUM CHLORIDE 0.9 % IV BOLUS (SEPSIS)
500.0000 mL | Freq: Once | INTRAVENOUS | Status: AC
  Administered 2013-09-27: 500 mL via INTRAVENOUS

## 2013-09-27 MED ORDER — SODIUM BICARBONATE 8.4 % IV SOLN
INTRAVENOUS | Status: DC
  Administered 2013-09-27 – 2013-09-28 (×3): via INTRAVENOUS
  Filled 2013-09-27 (×7): qty 1000

## 2013-09-27 MED ORDER — PANTOPRAZOLE SODIUM 40 MG IV SOLR
40.0000 mg | INTRAVENOUS | Status: DC
  Administered 2013-09-27: 40 mg via INTRAVENOUS
  Filled 2013-09-27 (×2): qty 40

## 2013-09-27 MED ORDER — NOREPINEPHRINE BITARTRATE 1 MG/ML IJ SOLN
2.0000 ug/min | INTRAVENOUS | Status: DC
  Administered 2013-09-27: 2 ug/min via INTRAVENOUS
  Filled 2013-09-27: qty 16

## 2013-09-27 NOTE — Progress Notes (Signed)
Inpatient Diabetes Program Recommendations  AACE/ADA: New Consensus Statement on Inpatient Glycemic Control (2013)  Target Ranges:  Prepandial:   less than 140 mg/dL      Peak postprandial:   less than 180 mg/dL (1-2 hours)      Critically ill patients:  140 - 180 mg/dL  Results for Colleen, [name] (MRN [id number]) as of 09/27/2013 09:15  Ref. Range 09/26/2013 16:16 09/26/2013 19:57 09/27/2013 00:30 09/27/2013 03:37 09/27/2013 07:59  Glucose-Capillary Latest Range: 70-99 mg/dL 168 (H) 219 (H) 227 (H) 234 (H) 277 (H)   Inpatient Diabetes Program Recommendations Insulin - Basal: consider adding low dose basal Lantus or Levemir 10 units  Thank you  Raoul Pitch BSN, RN,CDE Inpatient Diabetes Coordinator [phone] (team pager)

## 2013-09-27 NOTE — Progress Notes (Addendum)
Name: Colleen [name] MRN: [id number] DOB: 1961/06/19  ELECTRONIC ICU PHYSICIAN NOTE  Problem:  Shock/ anuric?   Intake/Output Summary (Last 24 hours) at 09/27/13 1747 Last data filed at 09/27/13 1400  Gross per 24 hour  Intake 2191.22 ml  Output    472 ml  Net 1719.22 ml     CVP:  [14 mmHg-15 mmHg] 15 mmHg on aprv    Intervention:  Volume/ levophed/ stress steroids, recheck cbc p fluids/ d/c vancomycin  [name] 09/27/2013, 5:47 PM

## 2013-09-27 NOTE — Progress Notes (Signed)
Noticed low urine output. Pt producing between 20-40 ml every two hours, less than 0.21mL/kg/hr. MD made aware of low urine output during bedside rounding. Pt bladder scanned at 1400 and no retained volume detected. Will continue to monitor and make MD aware if urine output decreases.

## 2013-09-27 NOTE — Progress Notes (Signed)
0 mL of urine output in 3 hours. BP from arterial line with MAP in low 60s. CVP 15 at 1703. Titrated fentanyl and versed drip down to 250 mcg/hr of fentanyl and 2 mg versed. Called Elink MD. Received new orders and restarted levophed. Will continue to monitor.

## 2013-09-27 NOTE — Progress Notes (Signed)
PULMONARY  / CRITICAL CARE MEDICINE  Name: RAFIA RAZZAK MRN: [id number] DOB: [birth year]    ADMISSION DATE:  09/21/2013 CONSULTATION DATE:  09/22/2013  REFERRING MD :  Dr. Stevie Kern PRIMARY SERVICE: PCCM  CHIEF COMPLAINT:  Weakness and vomiting  BRIEF PATIENT DESCRIPTION: 52 y/o female [facility] with weakness and emesis found to be in DKA vs HONC with shock requiring pressors. Intubated 10-18SIGNIFICANT EVENTS / STUDIES:  10/15 presented to [facility], given IV fluids, IV insulin, and pressors 10/16 transferred to [facility] ICU 10/16 pulled out L IJ 10/18 intubated for resp failure  LINES / TUBES: L IJ 10/16>>>16 10-17 l [place] cvl >> 10-18 R rad a line >> 10-18 ETT >>  CULTURES: Blood 10/15>>> E coli Urine 10/15>>> E coli MRSA PCR 10/15>>>neg Cdiff 10/16 >> neg GI pathogen panel 10/16>> NEG Resp culture 10/20  ANTIBIOTICS: Ceftriaxone 10/15>>>10/16, 10-18>>10/20 Vancomycin 10/16>>>10-17, 10/20 >>  Zosyn 1016>>>10-18, 10/20 >>   SUBJECTIVE: Maintaining sats with less support overnight, have titrated vent down some based on ABG. No acute events.   VITAL SIGNS: Temp:  [96.7 F (35.9 C)-100 F (37.8 C)] 98.3 F (36.8 C) (10/21 0700) Pulse Rate:  [81-96] 93 (10/21 0700) Resp:  [17-36] 19 (10/21 0700) BP: (79-119)/(48-68) 90/49 mmHg (10/21 0700) SpO2:  [85 %-100 %] 99 % (10/21 0700) Arterial Line BP: (80-124)/(49-75) 96/53 mmHg (10/21 0700) FiO2 (%):  [70 %-90 %] 70 % (10/21 0600) Weight:  [195 lb 1.7 oz (88.5 kg)] 195 lb 1.7 oz (88.5 kg) (10/21 0440) HEMODYNAMICS: CVP:  [14 mmHg-15 mmHg] 14 mmHg VENTILATOR SETTINGS: Vent Mode:  [-] Bi-Vent FiO2 (%):  [70 %-90 %] 70 % Set Rate:  [22 bmp] 22 bmp Vt Set:  [450 mL] 450 mL PEEP:  [5 cmH20-14 cmH20] 5 cmH20 Pressure Support:  [10 cmH20] 10 cmH20 INTAKE / OUTPUT: Intake/Output     10/20 0701 - 10/21 0700 10/21 0701 - 10/22 0700   I.V. (mL/kg) 1803.1 (20.4)    Other 60    NG/GT 727.5     IV Piggyback 412    Total Intake(mL/kg) 3002.6 (33.9)    Urine (mL/kg/hr) 778 (0.4)    Stool     Total Output 778     Net +2224.6            PHYSICAL EXAMINATION: Gen: sedated on vent HEENT: NCAT, PERRL CV: RRR, no murmur Resp: CTAB, good air movement, on vent Abd: SNTND, BS present, no guarding or organomegaly Ext: 2+ edema, 2+ DP pusles Neuro: Alert, interactive Skin: intact  LABS:  CBC Recent Labs     09/25/13  0400  09/26/13  0514  09/27/13  0445  WBC  16.1*  14.3*  13.8*  HGB  9.8*  9.4*  8.2*  HCT  27.3*  27.4*  24.6*  PLT  77*  58*  75*   Coag's No results found for this basename: APTT, INR,  in the last 72 hours BMET Recent Labs     09/26/13  0514  09/26/13  1216  09/27/13  0445  NA  137  139  140  K  4.3  4.4  4.3  CL  106  109  111  CO2  16*  18*  17*  BUN  38*  37*  46*  CREATININE  2.19*  2.09*  2.22*  GLUCOSE  217*  171*  255*   Electrolytes Recent Labs     09/26/13  0514  09/26/13  1216  09/27/13  0445  CALCIUM  7.2*  7.3*  7.4*   Sepsis Markers Recent Labs     09/26/13  1106  09/27/13  0445  PROCALCITON  10.92  11.62   ABG Recent Labs     09/26/13  0522  09/26/13  1802  09/26/13  2325  PHART  7.312*  7.257*  7.238*  PCO2ART  31.8*  37.9  35.9  PO2ART  60.0*  88.0  141.0*   Liver Enzymes No results found for this basename: AST, ALT, ALKPHOS, BILITOT, ALBUMIN,  in the last 72 hours Cardiac Enzymes No results found for this basename: TROPONINI, PROBNP,  in the last 72 hours Glucose Recent Labs     09/26/13  0821  09/26/13  1109  09/26/13  1616  09/26/13  1957  09/27/13  0030  09/27/13  0337  GLUCAP  161*  166*  168*  219*  227*  234*    Imaging US Renal  09/26/2013   CLINICAL DATA:  Evaluate for obstruction.  EXAM: RENAL/URINARY TRACT ULTRASOUND COMPLETE  COMPARISON:  None.  FINDINGS: Right Kidney  Length: Measures 11.7 cm. Normal renal cortical thickness and echogenicity. No hydronephrosis.  Left Kidney   Length: Measures 12.6 cm. Normal renal cortical thickness and echogenicity. Minimal pelviectasis.  Bladder  Grossly unremarkable. Contains a Foley catheter.  Bilateral pleural effusions.  IMPRESSION: Mild left renal pelviectasis  Bilateral pleural effusions.   Electronically Signed   By: Lovey Newcomer M.D.   On: 09/26/2013 16:44   Dg Chest Port 1 View  09/26/2013   ADDENDUM REPORT: 09/26/2013 07:51  ADDENDUM: Correction: Comparison exam is 09/25/2013   Electronically Signed   By: Markus Daft M.D.   On: 09/26/2013 07:51   09/26/2013   CLINICAL DATA:  Check endotracheal tube.  EXAM: PORTABLE CHEST - 1 VIEW  COMPARISON:  09/1913  FINDINGS: Endotracheal tube is 4.6 cm above the carina. Nasogastric tube extends into the abdomen. Bilateral airspace disease has minimally changed but may have slightly improved in the right lung. Heart size is grossly unchanged in size. Left subclavian central venous catheter tip is in the lower SVC. No evidence for a pneumothorax.  IMPRESSION: Persistent bilateral airspace disease. Overall, there has been minimal change in the airspace disease but it may be slightly less dense in the right lung.  Stable support apparatuses.  Electronically Signed: By: Markus Daft M.D. On: 09/26/2013 07:44   CXR: BL infiltrates slightly less prominent than previous, increased lung volumes  ASSESSMENT / PLAN:  PULMONARY A: Acute respiratory failure ARDS BL Pleural effusions Concern for VAP P:   PCV mode.  ARDS PEE/FiO2 algorithm Cont Bd's   CARDIOVASCULAR A:  Shock, Sepsis, hypovolemic, and cardiogenic Cardiomyopathy, LVEF 30-35% Troponin neg X 1(10-19 shock resolved) P:  CVP goal 12-15 MAP goal > 65 mmHg  A:   AKI, nonoliguric Suspect CKD Hyponatremia, resolved Metabolic Acidosis - non AG P:   Monitor BMET intermittently Correct electrolytes as indicated  GASTROINTESTINAL A:   Diarrhea, vomiting- resolved High post void residuals P:   SUP: PPI Cont TF at  trickle Consider post pyloric feeding tube  HEMATOLOGIC A:   Thrombocytopenia - probable compensated DIC, stable P:  DVT px: SCDs Avoid all heaprins Monitor CBC intermittently  INFECTIOUS A:   Severe sepsis E coli bacteremia due to Urinary source Severe watery diarrhea, C diff negative- resolved Suspected VAP P:   Micro and abx as above  ENDOCRINE A:   DM2   HONK/DKA resolved P:  SSI  NEUROLOGIC A:   Acute enceph  P:   Currently intubated and sedated. Versed and fentanyl   Laroy Apple, MD [place] Resident, PGY-2 09/27/2013, 7:36 AM   PCCM ATTENDING: I have interviewed and examined the patient and reviewed the database. I have formulated the assessment and plan as reflected in the note above with amendments made by me. 40 mins of direct critical care time provided. Family updated @ bedside   Merton Border, MD;  PCCM service; Mobile [phone]

## 2013-09-28 ENCOUNTER — Inpatient Hospital Stay (HOSPITAL_COMMUNITY): Payer: Federal, State, Local not specified - PPO

## 2013-09-28 LAB — GLUCOSE, CAPILLARY
Glucose-Capillary: 131 mg/dL — ABNORMAL HIGH (ref 70–99)
Glucose-Capillary: 131 mg/dL — ABNORMAL HIGH (ref 70–99)
Glucose-Capillary: 135 mg/dL — ABNORMAL HIGH (ref 70–99)
Glucose-Capillary: 140 mg/dL — ABNORMAL HIGH (ref 70–99)
Glucose-Capillary: 140 mg/dL — ABNORMAL HIGH (ref 70–99)
Glucose-Capillary: 140 mg/dL — ABNORMAL HIGH (ref 70–99)
Glucose-Capillary: 145 mg/dL — ABNORMAL HIGH (ref 70–99)
Glucose-Capillary: 148 mg/dL — ABNORMAL HIGH (ref 70–99)
Glucose-Capillary: 150 mg/dL — ABNORMAL HIGH (ref 70–99)
Glucose-Capillary: 150 mg/dL — ABNORMAL HIGH (ref 70–99)
Glucose-Capillary: 151 mg/dL — ABNORMAL HIGH (ref 70–99)
Glucose-Capillary: 153 mg/dL — ABNORMAL HIGH (ref 70–99)
Glucose-Capillary: 153 mg/dL — ABNORMAL HIGH (ref 70–99)
Glucose-Capillary: 153 mg/dL — ABNORMAL HIGH (ref 70–99)
Glucose-Capillary: 160 mg/dL — ABNORMAL HIGH (ref 70–99)
Glucose-Capillary: 161 mg/dL — ABNORMAL HIGH (ref 70–99)
Glucose-Capillary: 162 mg/dL — ABNORMAL HIGH (ref 70–99)
Glucose-Capillary: 163 mg/dL — ABNORMAL HIGH (ref 70–99)
Glucose-Capillary: 178 mg/dL — ABNORMAL HIGH (ref 70–99)
Glucose-Capillary: 183 mg/dL — ABNORMAL HIGH (ref 70–99)
Glucose-Capillary: 185 mg/dL — ABNORMAL HIGH (ref 70–99)
Glucose-Capillary: 227 mg/dL — ABNORMAL HIGH (ref 70–99)
Glucose-Capillary: 230 mg/dL — ABNORMAL HIGH (ref 70–99)
Glucose-Capillary: 245 mg/dL — ABNORMAL HIGH (ref 70–99)
Glucose-Capillary: 246 mg/dL — ABNORMAL HIGH (ref 70–99)
Glucose-Capillary: 276 mg/dL — ABNORMAL HIGH (ref 70–99)
Glucose-Capillary: 301 mg/dL — ABNORMAL HIGH (ref 70–99)
Glucose-Capillary: 317 mg/dL — ABNORMAL HIGH (ref 70–99)

## 2013-09-28 LAB — CBC
HCT: 23.2 % — ABNORMAL LOW (ref 36.0–46.0)
HCT: 23.2 % — ABNORMAL LOW (ref 36.0–46.0)
Hemoglobin: 7.7 g/dL — ABNORMAL LOW (ref 12.0–15.0)
Hemoglobin: 7.8 g/dL — ABNORMAL LOW (ref 12.0–15.0)
MCH: 23.4 pg — ABNORMAL LOW (ref 26.0–34.0)
MCH: 23.4 pg — ABNORMAL LOW (ref 26.0–34.0)
MCHC: 33.2 g/dL (ref 30.0–36.0)
MCHC: 33.6 g/dL (ref 30.0–36.0)
MCV: 70.5 fL — ABNORMAL LOW (ref 78.0–100.0)
MCV: 69.7 fL — ABNORMAL LOW (ref 78.0–100.0)
Platelets: 80 10*3/uL — ABNORMAL LOW (ref 150–400)
Platelets: 87 10*3/uL — ABNORMAL LOW (ref 150–400)
RBC: 3.29 MIL/uL — ABNORMAL LOW (ref 3.87–5.11)
RBC: 3.33 MIL/uL — ABNORMAL LOW (ref 3.87–5.11)
RDW: 15.6 % — ABNORMAL HIGH (ref 11.5–15.5)
RDW: 15.5 % (ref 11.5–15.5)
WBC: 19.6 10*3/uL — ABNORMAL HIGH (ref 4.0–10.5)
WBC: 18.3 10*3/uL — ABNORMAL HIGH (ref 4.0–10.5)

## 2013-09-28 LAB — POCT I-STAT 3, ART BLOOD GAS (G3+)
Acid-Base Excess: 1 mmol/L (ref 0.0–2.0)
Bicarbonate: 26.2 mEq/L — ABNORMAL HIGH (ref 20.0–24.0)
O2 Saturation: 99 %
Patient temperature: 98.8
TCO2: 27 mmol/L (ref 0–100)
pCO2 arterial: 42.3 mmHg (ref 35.0–45.0)
pH, Arterial: 7.4 (ref 7.350–7.450)
pO2, Arterial: 151 mmHg — ABNORMAL HIGH (ref 80.0–100.0)

## 2013-09-28 LAB — CULTURE, RESPIRATORY W GRAM STAIN

## 2013-09-28 LAB — BASIC METABOLIC PANEL
BUN: 51 mg/dL — ABNORMAL HIGH (ref 6–23)
CO2: 22 mEq/L (ref 19–32)
Calcium: 8.2 mg/dL — ABNORMAL LOW (ref 8.4–10.5)
Chloride: 111 mEq/L (ref 96–112)
Creatinine, Ser: 2.17 mg/dL — ABNORMAL HIGH (ref 0.50–1.10)
GFR calc Af Amer: 29 mL/min — ABNORMAL LOW (ref >90)
GFR calc non Af Amer: 25 mL/min — ABNORMAL LOW (ref >90)
Glucose, Bld: 193 mg/dL — ABNORMAL HIGH (ref 70–99)
Potassium: 3.6 mEq/L (ref 3.5–5.1)
Sodium: 144 mEq/L (ref 135–145)

## 2013-09-28 LAB — PRO B NATRIURETIC PEPTIDE: Pro B Natriuretic peptide (BNP): 2967 pg/mL — ABNORMAL HIGH (ref 0–125)

## 2013-09-28 LAB — CULTURE, RESPIRATORY

## 2013-09-28 LAB — PROCALCITONIN: Procalcitonin: 7.68 ng/mL

## 2013-09-28 MED ORDER — PANTOPRAZOLE SODIUM 40 MG PO PACK
40.0000 mg | PACK | Freq: Every day | ORAL | Status: DC
  Administered 2013-09-28 – 2013-09-29 (×2): 40 mg
  Filled 2013-09-28 (×3): qty 20

## 2013-09-28 MED ORDER — HYDROCORTISONE SOD SUCCINATE 100 MG IJ SOLR
50.0000 mg | Freq: Two times a day (BID) | INTRAMUSCULAR | Status: DC
  Administered 2013-09-28 – 2013-09-29 (×2): 50 mg via INTRAVENOUS
  Filled 2013-09-28 (×4): qty 1

## 2013-09-28 MED ORDER — MIDAZOLAM HCL 2 MG/2ML IJ SOLN
2.0000 mg | INTRAMUSCULAR | Status: DC | PRN
Start: 2013-09-28 — End: 2013-09-30
  Administered 2013-09-29 (×6): 2 mg via INTRAVENOUS
  Administered 2013-09-30: 4 mg via INTRAVENOUS
  Administered 2013-09-30: 2 mg via INTRAVENOUS
  Filled 2013-09-28 (×2): qty 4
  Filled 2013-09-28: qty 2
  Filled 2013-09-28 (×3): qty 4

## 2013-09-28 MED ORDER — FUROSEMIDE 10 MG/ML IJ SOLN
80.0000 mg | Freq: Once | INTRAMUSCULAR | Status: AC
  Administered 2013-09-28: 80 mg via INTRAVENOUS
  Filled 2013-09-28: qty 8

## 2013-09-28 MED ORDER — SODIUM CHLORIDE 0.9 % IV SOLN
INTRAVENOUS | Status: DC
  Administered 2013-09-27: 19:00:00 via INTRAVENOUS

## 2013-09-28 NOTE — Progress Notes (Addendum)
Continuous Versed infusion discontinued. 35 mL of Versed wasted into the sink. Witnessed by Livia Snellen RN.  MOORE, Jaynie Bream   I agree with above  Richardean Canal RN, BSN, CCRN

## 2013-09-28 NOTE — Progress Notes (Addendum)
error 

## 2013-09-28 NOTE — Progress Notes (Signed)
PULMONARY  / CRITICAL CARE MEDICINE  Name: Colleen [name] MRN: [id number] DOB: [birth year]    ADMISSION DATE:  09/21/2013 CONSULTATION DATE:  09/22/2013  REFERRING MD :  Dr. Stevie Kern PRIMARY SERVICE: PCCM  CHIEF COMPLAINT:  Weakness and vomiting  BRIEF PATIENT DESCRIPTION: 52 y/o female presenting [facility] with weakness and emesis found to be in DKA vs HONC with shock requiring pressors. Intubated 10-18  SIGNIFICANT EVENTS / STUDIES:  10/15 presented to [facility], given IV fluids, IV insulin, and pressors 10/16 transferred to [facility] ICU 10/16 pulled out L IJ 10/18 intubated for resp failure 10/20 increased support on vent, Bivent effective 10/21 started bicarb drip for NAG acidosis 10/21 pressors started and weaned again  LINES / TUBES: L IJ 10/16>>>16 10-17 l Snyder cvl >> 10-18 R rad a line >> 10-18 ETT >>  CULTURES: Blood 10/15>>> E coli Urine 10/15>>> E coli MRSA PCR 10/15>>>neg Cdiff 10/16 >> neg GI pathogen panel 10/16>> NEG Resp culture 10/20 >>  ANTIBIOTICS: Ceftriaxone 10/15>>>10/16, 10-18>>10/20 Vancomycin 10/16>>>10-17, 10/20 >> 10/21 Zosyn 1016>>>10-18, 10/20 >>   SUBJECTIVE: Hypotensive overnight requiring levo, stress dose steroids started, Vanc dc'd, and Iv insulin started. Levo now weaned. Some decreased UOP on day shift 10/21 which improved overnight.   VITAL SIGNS: Temp:  [96.4 F (35.8 C)-97.9 F (36.6 C)] 97 F (36.1 C) (10/22 0800) Pulse Rate:  [88-104] 91 (10/22 0800) Resp:  [15-31] 17 (10/22 0800) BP: (92-137)/(46-71) 137/71 mmHg (10/22 0808) SpO2:  [97 %-100 %] 100 % (10/22 0800) Arterial Line BP: (89-133)/(46-62) 129/54 mmHg (10/22 0800) FiO2 (%):  [70 %] 70 % (10/22 0808) Weight:  [202 lb 6.1 oz (91.8 kg)] 202 lb 6.1 oz (91.8 kg) (10/22 0144) HEMODYNAMICS: CVP:  [15 mmHg] 15 mmHg VENTILATOR SETTINGS: Vent Mode:  [-] PCV FiO2 (%):  [70 %] 70 % Set Rate:  [15 bmp-16 bmp] 15 bmp PEEP:  [14 cmH20] 14  cmH20 Plateau Pressure:  [18 cmH20-30 cmH20] 27 cmH20 INTAKE / OUTPUT: Intake/Output     10/21 0701 - 10/22 0700 10/22 0701 - 10/23 0700   I.V. (mL/kg) 3149.7 (34.3)    Other 60    NG/GT 840    IV Piggyback 650    Total Intake(mL/kg) 4699.7 (51.2)    Urine (mL/kg/hr) 1034 (0.5)    Total Output 1034     Net +3665.7            PHYSICAL EXAMINATION: Gen: sedated on vent HEENT: NCAT, PERRL CV: RRR, no murmur Resp: diffusely coarse, good air movement, on vent Abd: SNTND, BS present, no guarding or organomegaly Ext: 2+ edema, 2+ DP pusles Neuro: Alert, interactive Skin: intact  LABS:  CBC Recent Labs     09/27/13  0445  09/27/13  1800  09/28/13  0425  WBC  13.8*  16.9*  19.6*  HGB  8.2*  7.4*  7.7*  HCT  24.6*  21.9*  23.2*  PLT  75*  69*  80*   Coag's No results found for this basename: APTT, INR,  in the last 72 hours BMET Recent Labs     09/26/13  1216  09/27/13  0445  09/28/13  0425  NA  139  140  144  K  4.4  4.3  3.6  CL  109  111  111  CO2  18*  17*  22  BUN  37*  46*  51*  CREATININE  2.09*  2.22*  2.17*  GLUCOSE  171*  255*  193*   Electrolytes Recent Labs     09/26/13  1216  09/27/13  0445  09/28/13  0425  CALCIUM  7.3*  7.4*  8.2*   Sepsis Markers Recent Labs     09/26/13  1106  09/27/13  0445  09/28/13  0425  PROCALCITON  10.92  11.62  7.68   ABG Recent Labs     09/26/13  0522  09/26/13  1802  09/26/13  2325  PHART  7.312*  7.257*  7.238*  PCO2ART  31.8*  37.9  35.9  PO2ART  60.0*  88.0  141.0*   Liver Enzymes No results found for this basename: AST, ALT, ALKPHOS, BILITOT, ALBUMIN,  in the last 72 hours Cardiac Enzymes Recent Labs     09/28/13  0425  PROBNP  2967.0*   Glucose Recent Labs     09/28/13  0052  09/28/13  0147  09/28/13  0251  09/28/13  0357  09/28/13  0458  09/28/13  0601  GLUCAP  227*  185*  183*  178*  163*  160*    Imaging Renal US 10/20 IMPRESSION:  Mild left renal pelviectasis  Bilateral  pleural effusions.  CXR: Worsening BL infiltrates, possible development of small R pleural effusion  ASSESSMENT / PLAN:  PULMONARY A: Acute respiratory failure ARDS BL Pleural effusions Concern for VAP P:   PCV mode.  ARDS PEE/FiO2 algorithm Cont Bd's  CARDIOVASCULAR A:  Shock, Sepsis, hypovolemic, and cardiogenic Cardiomyopathy, LVEF 30-35% Troponin neg X 1(10-19 shock resolved) P:  CVP goal 12-15 MAP goal > 65 mmHg  A:   AKI Suspect CKD Hyponatremia, resolved Metabolic Acidosis - resolved P:   Monitor BMET intermittently Correct electrolytes as indicated  bicarb  GASTROINTESTINAL A:   Diarrhea, vomiting- resolved High post void residuals P:   SUP: PPI Cont TF at trickle  HEMATOLOGIC A:   Thrombocytopenia - probable compensated DIC, stable P:  DVT px: SCDs Avoid all heaprins Monitor CBC intermittently Transfuse less than 7  INFECTIOUS A:   Severe sepsis E coli bacteremia due to Urinary source Severe watery diarrhea, C diff negative- resolved Suspected VAP P:   Micro and abx as above  ENDOCRINE A:   DM2   HONK/DKA resolved P:   IV insulin per glucose stabilizer  NEUROLOGIC A:   Acute enceph  P:   Currently intubated and sedated. Versed and fentanyl   Laroy Apple, MD [place] Resident, PGY-2 09/28/2013, 8:40 AM     PCCM ATTENDING: I have interviewed and examined the patient and reviewed the database. I have formulated the assessment and plan as reflected in the note above with amendments made by me. 40 mins of direct critical care time provided  Family updated @ bedside   Merton Border, MD;  PCCM service; Mobile [phone]

## 2013-09-29 ENCOUNTER — Inpatient Hospital Stay (HOSPITAL_COMMUNITY): Payer: Federal, State, Local not specified - PPO

## 2013-09-29 LAB — BASIC METABOLIC PANEL
BUN: 60 mg/dL — ABNORMAL HIGH (ref 6–23)
CO2: 25 mEq/L (ref 19–32)
Calcium: 8.4 mg/dL (ref 8.4–10.5)
Chloride: 108 mEq/L (ref 96–112)
Creatinine, Ser: 2.23 mg/dL — ABNORMAL HIGH (ref 0.50–1.10)
GFR calc Af Amer: 28 mL/min — ABNORMAL LOW (ref >90)
GFR calc non Af Amer: 24 mL/min — ABNORMAL LOW (ref >90)
Glucose, Bld: 138 mg/dL — ABNORMAL HIGH (ref 70–99)
Potassium: 3 mEq/L — ABNORMAL LOW (ref 3.5–5.1)
Sodium: 146 mEq/L — ABNORMAL HIGH (ref 135–145)

## 2013-09-29 LAB — CBC
HCT: 23.6 % — ABNORMAL LOW (ref 36.0–46.0)
Hemoglobin: 8 g/dL — ABNORMAL LOW (ref 12.0–15.0)
MCH: 23.6 pg — ABNORMAL LOW (ref 26.0–34.0)
MCHC: 33.9 g/dL (ref 30.0–36.0)
MCV: 69.6 fL — ABNORMAL LOW (ref 78.0–100.0)
Platelets: 92 10*3/uL — ABNORMAL LOW (ref 150–400)
RBC: 3.39 MIL/uL — ABNORMAL LOW (ref 3.87–5.11)
RDW: 15.3 % (ref 11.5–15.5)
WBC: 18.9 10*3/uL — ABNORMAL HIGH (ref 4.0–10.5)

## 2013-09-29 LAB — GLUCOSE, CAPILLARY
Glucose-Capillary: 128 mg/dL — ABNORMAL HIGH (ref 70–99)
Glucose-Capillary: 132 mg/dL — ABNORMAL HIGH (ref 70–99)
Glucose-Capillary: 132 mg/dL — ABNORMAL HIGH (ref 70–99)
Glucose-Capillary: 144 mg/dL — ABNORMAL HIGH (ref 70–99)
Glucose-Capillary: 145 mg/dL — ABNORMAL HIGH (ref 70–99)
Glucose-Capillary: 150 mg/dL — ABNORMAL HIGH (ref 70–99)
Glucose-Capillary: 151 mg/dL — ABNORMAL HIGH (ref 70–99)
Glucose-Capillary: 151 mg/dL — ABNORMAL HIGH (ref 70–99)
Glucose-Capillary: 151 mg/dL — ABNORMAL HIGH (ref 70–99)
Glucose-Capillary: 154 mg/dL — ABNORMAL HIGH (ref 70–99)
Glucose-Capillary: 154 mg/dL — ABNORMAL HIGH (ref 70–99)
Glucose-Capillary: 155 mg/dL — ABNORMAL HIGH (ref 70–99)
Glucose-Capillary: 199 mg/dL — ABNORMAL HIGH (ref 70–99)

## 2013-09-29 MED ORDER — PRO-STAT SUGAR FREE PO LIQD
30.0000 mL | Freq: Every day | ORAL | Status: DC
  Administered 2013-09-29 – 2013-09-30 (×3): 30 mL
  Filled 2013-09-29 (×8): qty 30

## 2013-09-29 MED ORDER — INSULIN GLARGINE 100 UNIT/ML ~~LOC~~ SOLN
25.0000 [IU] | Freq: Every day | SUBCUTANEOUS | Status: DC
  Administered 2013-09-29: 25 [IU] via SUBCUTANEOUS
  Filled 2013-09-29 (×3): qty 0.25

## 2013-09-29 MED ORDER — FUROSEMIDE 10 MG/ML IJ SOLN
40.0000 mg | Freq: Once | INTRAMUSCULAR | Status: AC
  Administered 2013-09-29: 40 mg via INTRAVENOUS
  Filled 2013-09-29: qty 4

## 2013-09-29 MED ORDER — OXEPA PO LIQD
1000.0000 mL | ORAL | Status: DC
  Administered 2013-09-29: 1000 mL
  Filled 2013-09-29 (×3): qty 1000

## 2013-09-29 MED ORDER — INSULIN ASPART 100 UNIT/ML ~~LOC~~ SOLN
1.0000 [IU] | SUBCUTANEOUS | Status: DC
  Administered 2013-09-29 – 2013-09-30 (×7): 2 [IU] via SUBCUTANEOUS

## 2013-09-29 MED ORDER — INSULIN ASPART 100 UNIT/ML ~~LOC~~ SOLN
4.0000 [IU] | SUBCUTANEOUS | Status: DC
  Administered 2013-09-29 – 2013-09-30 (×7): 4 [IU] via SUBCUTANEOUS

## 2013-09-29 MED ORDER — DEXTROSE 10 % IV SOLN: INTRAVENOUS | Status: DC | PRN

## 2013-09-29 NOTE — Progress Notes (Signed)
PULMONARY  / CRITICAL CARE MEDICINE  Name: Colleen [name] MRN: [id number] DOB: Jul 04, 1961    ADMISSION DATE:  09/21/2013 CONSULTATION DATE:  09/22/2013  REFERRING MD :  Dr. Stevie Kern PRIMARY SERVICE: PCCM  CHIEF COMPLAINT:  Weakness and vomiting  BRIEF PATIENT DESCRIPTION: 52 y/o female presenting [facility] with weakness and emesis found to be in DKA vs HONC with shock requiring pressors. Intubated 10-18  SIGNIFICANT EVENTS / STUDIES:  10/15 presented to [facility], given IV fluids, IV insulin, and pressors 10/16 transferred to [facility] ICU 10/16 pulled out L IJ 10/18 intubated for resp failure 10/20 increased support on vent, Bivent effective 10/21 started bicarb drip for NAG acidosis 10/21 pressors started and weaned again  LINES / TUBES: L IJ 10/16>>>16 10-17 l [place] cvl >> 10-18 R rad a line >> 10-18 ETT >>  CULTURES: Blood 10/15>>> E coli Urine 10/15>>> E coli MRSA PCR 10/15>>>neg Cdiff 10/16 >> neg GI pathogen panel 10/16>> NEG Resp culture 10/20 >>normal flora  ANTIBIOTICS: Ceftriaxone 10/15>>>10/16, 10-18>>10/20 Vancomycin 10/16>>>10-17, 10/20 >> 10/21 Zosyn 1016>>>10-18, 10/20 >>   SUBJECTIVE: No acute events overnight. Sedated on vent.   VITAL SIGNS: Temp:  [97 F (36.1 C)-99.9 F (37.7 C)] 99.7 F (37.6 C) (10/23 0500) Pulse Rate:  [87-101] 97 (10/23 0500) Resp:  [15-27] 23 (10/23 0500) BP: (106-154)/(61-81) 150/70 mmHg (10/23 0500) SpO2:  [99 %-100 %] 100 % (10/23 0500) Arterial Line BP: (115-183)/(54-77) 158/66 mmHg (10/23 0400) FiO2 (%):  [50 %-70 %] 50 % (10/23 0400) Weight:  [203 lb 4.2 oz (92.2 kg)] 203 lb 4.2 oz (92.2 kg) (10/23 0500) HEMODYNAMICS:   VENTILATOR SETTINGS: Vent Mode:  [-] PCV FiO2 (%):  [50 %-70 %] 50 % Set Rate:  [15 bmp] 15 bmp PEEP:  [10 cmH20-14 cmH20] 10 cmH20 Plateau Pressure:  [21 cmH20-30 cmH20] 30 cmH20 INTAKE / OUTPUT: Intake/Output     10/22 0701 - 10/23 0700 10/23 0701 - 10/24 0700   I.V.  (mL/kg) 671 (7.3)    Other     NG/GT 900    IV Piggyback 150    Total Intake(mL/kg) 1721 (18.7)    Urine (mL/kg/hr) 2105 (1)    Stool 200 (0.1)    Total Output 2305     Net -584.1            PHYSICAL EXAMINATION: Gen: sedated on vent HEENT: NCAT, PERRL, MMM CV: RRR, no murmur Resp: diffusely coarse, good air movement, on vent Abd: SNTND, BS present, no guarding or organomegaly Ext: 2+ edema, 2+ DP pusles Neuro: Alert, interactive Skin: intact  LABS:  CBC  Recent Labs Lab 09/28/13 0425 09/28/13 1455 09/29/13 0500  HGB 7.7* 7.8* 8.0*  HCT 23.2* 23.2* 23.6*  WBC 19.6* 18.3* 18.9*  PLT 80* 87* 92*   Coag's No results found for this basename: APTT, INR,  in the last 72 hours BMET  Recent Labs Lab 09/26/13 0027 09/26/13 0514 09/26/13 1216 09/27/13 0445 09/28/13 0425  NA 136 137 139 140 144  K 4.4 4.3 4.4 4.3 3.6  CL 106 106 109 111 111  CO2 17* 16* 18* 17* 22  GLUCOSE 200* 217* 171* 255* 193*  BUN 37* 38* 37* 46* 51*  CREATININE 2.28* 2.19* 2.09* 2.22* 2.17*  CALCIUM 7.3* 7.2* 7.3* 7.4* 8.2*    Electrolytes Recent Labs     09/26/13  1216  09/27/13  0445  09/28/13  0425  CALCIUM  7.3*  7.4*  8.2*   Sepsis  Markers Recent Labs     09/26/13  1106  09/27/13  0445  09/28/13  0425  PROCALCITON  10.92  11.62  7.68   ABG Recent Labs     09/26/13  1802  09/26/13  2325  09/28/13  1638  PHART  7.257*  7.238*  7.400  PCO2ART  37.9  35.9  42.3  PO2ART  88.0  141.0*  151.0*   Liver Enzymes No results found for this basename: AST, ALT, ALKPHOS, BILITOT, ALBUMIN,  in the last 72 hours Cardiac Enzymes Recent Labs     09/28/13  0425  PROBNP  2967.0*   Glucose Recent Labs     09/28/13  2332  09/29/13  0021  09/29/13  0126  09/29/13  0223  09/29/13  0321  09/29/13  0408  GLUCAP  151*  151*  145*  132*  132*  128*    Imaging Renal US 10/20 IMPRESSION:  Mild left renal pelviectasis  Bilateral pleural effusions.  CXR: stable BL  infiltrates  ASSESSMENT / PLAN:  PULMONARY A: Acute respiratory failure ARDS BL Pleural effusions Concern for VAP P: PCV mode.  ARDS PEEP/FiO2 algorithm Cont Bd's  CARDIOVASCULAR A:  Shock, Sepsis, hypovolemic, and cardiogenic Cardiomyopathy, LVEF 30-35% Troponin neg X 1(10-19 shock resolved) P:  CVP goal 12-15 MAP goal > 65 mmHg  A:   AKI Suspect CKD Hyponatremia, resolved Metabolic Acidosis - resolved P:   Monitor BMET intermittently Correct electrolytes as indicated  GASTROINTESTINAL A:   Diarrhea, vomiting- resolved High post void residuals P:   SUP: PPI Cont TF at trickle  HEMATOLOGIC A:   Thrombocytopenia - probable compensated DIC, stable P:  DVT px: SCDs Avoid all heparins Monitor CBC intermittently Transfuse less than 7  INFECTIOUS A:   Severe sepsis E coli bacteremia due to Urinary source Severe watery diarrhea, C diff negative- resolved possible VAP P:   Micro and abx as above  ENDOCRINE A:   DM2   HONK/DKA resolved P:   IV insulin per glucose stabilizer, consider transitioning to SSI + lantus  NEUROLOGIC A:   Acute enceph  P:   Currently intubated and sedated. Versed and fentanyl   Laroy Apple, MD [place] Resident, PGY-2 09/29/2013, 7:34 AM   PCCM ATTENDING: I have interviewed and examined the patient and reviewed the database. I have formulated the assessment and plan as reflected in the note above with amendments made by me. 40 mins of direct critical care time provided  Merton Border, MD;  PCCM service; Mobile [phone]

## 2013-09-29 NOTE — Progress Notes (Signed)
NUTRITION FOLLOW UP  Intervention:    Decrease Oxepa goal rate to 25 ml/h with Prostat 30 ml 5 times daily to provide 1400 kcals (70% of estimated needs), 113 gm protein, 471 ml free water daily.  Nutrition Dx:   Inadequate oral intake related to inability to eat as evidenced by NPO status.   Goal:   Enteral nutrition to provide 60-70% of estimated calorie needs (22-25 kcals/kg ideal body weight) and 100% of estimated protein needs, based on ASPEN guidelines for permissive underfeeding in critically ill obese individuals. Unmet with re-estimated needs.  Monitor:   TF tolerance/adequacy, weight trend, labs, vent status.  Assessment:   Patient remains on ventilator on ARDS protocol. Received bicarb drip yesterday for NAG acidosis. Had minimal residuals of 150-250 ml yesterday. Tolerating TF well.  Patient is currently intubated on ventilator support.  MV: 13.9 L/min Temp:Temp (24hrs), Avg:99.3 F (37.4 C), Min:98 F (36.7 C), Max:99.9 F (37.7 C)   Height: Ht Readings from Last 1 Encounters:  09/21/13 5\' 4"  (1.626 m)    Weight Status:  Increased with edema Wt Readings from Last 1 Encounters:  09/29/13 203 lb 4.2 oz (92.2 kg)  09/26/13  191 lb 9.3 oz (86.9 kg)   Re-estimated needs:  Kcal: 1980 Protein: 109 gm Fluid: 1.9-2 L  Skin: No wounds  Diet Order:  NPO  TF Order: Oxepa at 30 ml/h with Prostat 30 ml QID providing 1480 kcals (75% of estimated needs), 105 gm protein, 565 ml free water daily.  Free water flushes: 100 ml every 8 hours  TF Residuals: 0 ml today; 150-250 ml yesterday   Intake/Output Summary (Last 24 hours) at 09/29/13 1548 Last data filed at 09/29/13 1330  Gross per 24 hour  Intake 1728.68 ml  Output   1305 ml  Net 423.68 ml    Last BM: 10/23; rectal tube/pouch in place for loose stooling   Labs:   Recent Labs Lab 09/27/13 0445 09/28/13 0425 09/29/13 0500  NA 140 144 146*  K 4.3 3.6 3.0*  CL 111 111 108  CO2 17* 22 25  BUN 46*  51* 60*  CREATININE 2.22* 2.17* 2.23*  CALCIUM 7.4* 8.2* 8.4  GLUCOSE 255* 193* 138*    CBG (last 3)   Recent Labs  09/29/13 0700 09/29/13 0742 09/29/13 1206  GLUCAP 151* 154* 155*    Scheduled Meds: . albuterol  2.5 mg Nebulization Q6H  . antiseptic oral rinse  15 mL Mouth Rinse QID  . chlorhexidine  15 mL Mouth Rinse BID  . feeding supplement (OXEPA)  1,000 mL Per Tube Q24H  . feeding supplement (PRO-STAT SUGAR FREE 64)  30 mL Per Tube QID  . free water  100 mL Per Tube Q8H  . insulin aspart  1-3 Units Subcutaneous Q4H  . insulin aspart  4 Units Subcutaneous Q4H  . insulin glargine  25 Units Subcutaneous Daily  . pantoprazole sodium  40 mg Per Tube Q1200  . piperacillin-tazobactam (ZOSYN)  IV  3.375 g Intravenous Q8H    Continuous Infusions: . sodium chloride 20 mL/hr at 09/27/13 1900  . dextrose    . fentaNYL infusion INTRAVENOUS 250 mcg/hr (09/29/13 1400)    [name], RD, LDN, [place] Pager [phone] After Hours Pager [phone]

## 2013-09-29 NOTE — Progress Notes (Signed)
Pharmacist Heart Failure Core Measure Documentation  Assessment: Colleen [name] has an EF documented as 30-35% on 09/22/13 by ECHO.  Rationale: Heart failure patients with left ventricular systolic dysfunction (LVSD) and an EF < 40% should be prescribed an angiotensin converting enzyme inhibitor (ACEI) or angiotensin receptor blocker (ARB) at discharge unless a contraindication is documented in the medical record.  This patient is not currently on an ACEI or ARB for HF.  This note is being placed in the record in order to provide documentation that a contraindication to the use of these agents is present for this encounter.  ACE Inhibitor or Angiotensin Receptor Blocker is contraindicated (specify all that apply)  []   ACEI allergy AND ARB allergy []   Angioedema []   Moderate or severe aortic stenosis []   Hyperkalemia []   Hypotension []   Renal artery stenosis [x]   Worsening renal function, preexisting renal disease or dysfunction  Uvaldo Rising, BCPS  Clinical Pharmacist Pager [phone]  09/29/2013 9:42 AM

## 2013-09-29 NOTE — Progress Notes (Signed)
UR Completed.  Colleen [name] [id number] 09/29/2013

## 2013-09-29 NOTE — Progress Notes (Signed)
ANTIBIOTIC CONSULT NOTE - Follow-Up  Pharmacy Consult for Zosyn Indication: endocarditis and PNA, sepsis, bacteremia  No Known Allergies  Patient Measurements: Height: 5\' 4"  (162.6 cm) Weight: 203 lb 4.2 oz (92.2 kg) IBW/kg (Calculated) : 54.7 Adjusted Body Weight:  67.5 kg  Vital Signs: Temp: 99.5 F (37.5 C) (10/23 0843) Temp src: Core (Comment) (10/23 0400) BP: 204/81 mmHg (10/23 0843) Pulse Rate: 103 (10/23 0843) Intake/Output from previous day: 10/22 0701 - 10/23 0700 In: 1721 [I.V.:671; NG/GT:900; IV Piggyback:150] Out: 2305 [Urine:2105; Stool:200] Intake/Output from this shift:    Labs:  Recent Labs  09/27/13 0445  09/28/13 0425 09/28/13 1455 09/29/13 0500  WBC 13.8*  < > 19.6* 18.3* 18.9*  HGB 8.2*  < > 7.7* 7.8* 8.0*  PLT 75*  < > 80* 87* 92*  CREATININE 2.22*  --  2.17*  --  2.23*  < > = values in this interval not displayed. Estimated Creatinine Clearance: 32.8 ml/min (by C-G formula based on Cr of 2.23). No results found for this basename: VANCOTROUGH, Corlis Leak, VANCORANDOM, GENTTROUGH, GENTPEAK, GENTRANDOM, TOBRATROUGH, TOBRAPEAK, TOBRARND, AMIKACINPEAK, AMIKACINTROU, AMIKACIN,  in the last 72 hours   Microbiology: Recent Results (from the past 720 hour(s))  CULTURE, BLOOD (ROUTINE X 2)     Status: None   Collection Time    09/21/13 10:55 PM      Result Value Range Status   Specimen Description BLOOD RIGHT [facility]   Final   Special Requests BOTTLES DRAWN AEROBIC AND ANAEROBIC  [facility]   Final   Culture  Setup Time     Final   Value: 09/22/2013 04:36     Performed at [facility]   Culture     Final   Value: ESCHERICHIA COLI     Note: SUSCEPTIBILITIES PERFORMED ON PREVIOUS CULTURE WITHIN THE LAST 5 DAYS.     Note: Gram Stain Report Called to,Read Back By and Verified With: CHRIS MCKEOWN @ [id number] 09/22/13 BY KRAWS     Performed at [facility]   Report Status 09/24/2013 FINAL   Final  CULTURE, BLOOD (ROUTINE X 2)     Status: None   Collection Time    09/21/13 11:10 PM      Result Value Range Status   Specimen Description BLOOD LEFT AC   Final   Special Requests BOTTLES DRAWN AEROBIC AND ANAEROBIC [facility]   Final   Culture  Setup Time     Final   Value: 09/22/2013 04:36     Performed at [facility]   Culture     Final   Value: ESCHERICHIA COLI     Note: Gram Stain Report Called to,Read Back By and Verified With: CHRIS MCKEOWN @ [id number] 09/22/13 BY KRAWS     Performed at [facility]   Report Status 09/24/2013 FINAL   Final   Organism ID, Bacteria ESCHERICHIA COLI   Final  URINE CULTURE     Status: None   Collection Time    09/22/13 12:30 AM      Result Value Range Status   Specimen Description URINE, CLEAN CATCH   Final   Special Requests NONE   Final   Culture  Setup Time     Final   Value: 09/22/2013 05:00     Performed at [place]     Final   Value: >=100,000 COLONIES/ML     Performed at [facility] [facility]     Final  Value: ESCHERICHIA COLI     Performed at [facility]   Report Status 09/23/2013 FINAL   Final   Organism ID, Bacteria ESCHERICHIA COLI   Final  MRSA PCR SCREENING     Status: None   Collection Time    09/22/13  5:27 AM      Result Value Range Status   MRSA by PCR NEGATIVE  NEGATIVE Final   Comment:            The GeneXpert MRSA Assay (FDA     approved for NASAL specimens     only), is one component of a     comprehensive MRSA colonization     surveillance program. It is not     intended to diagnose MRSA     infection nor to guide or     monitor treatment for     MRSA infections.  CLOSTRIDIUM DIFFICILE BY PCR     Status: None   Collection Time    09/22/13 11:45 AM      Result Value Range Status   C difficile by pcr NEGATIVE  NEGATIVE Final  CULTURE, RESPIRATORY (NON-EXPECTORATED)     Status: None   Collection Time    09/26/13  4:38 PM      Result Value Range Status   Specimen Description TRACHEAL ASPIRATE   Final    Special Requests NONE   Final   Gram Stain     Final   Value: FEW WBC PRESENT, PREDOMINANTLY PMN     NO SQUAMOUS EPITHELIAL CELLS SEEN     NO ORGANISMS SEEN     Performed at [facility]   Culture     Final   Value: Non-Pathogenic Oropharyngeal-type Flora Isolated.     Performed at [facility]   Report Status 09/28/2013 FINAL   Final    Medical History: Past Medical History  Diagnosis Date  . Diabetes   . 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 Ecoli in blood and urine. Also with AKI and estimated CrCl ~ 30 ml/min.  Afebrile today, and WBC trending down.  Rocephin 10/15>>10/16; 10/18 >>10/20 Vancomycin 10/16>>10/17, 10/20>> Zosyn 1016>>10/18, 10/20>>  10/15 BCx: 2/2 ecoli- pan sens 10/15 Urine: ecoli, same as blood 10/15 MRSA PCR neg 10/16: Cdiff negative   Goal of Therapy:  Resolution of infection  Plan:  1. Continue Zosyn 3.375g IV q8hrs. 2. F/u stop date for Zosyn.  Uvaldo Rising, BCPS  Clinical Pharmacist Pager [phone]  09/29/2013 9:41 AM

## 2013-09-30 ENCOUNTER — Inpatient Hospital Stay (HOSPITAL_COMMUNITY): Payer: Federal, State, Local not specified - PPO

## 2013-09-30 LAB — CBC
HCT: 24.4 % — ABNORMAL LOW (ref 36.0–46.0)
Hemoglobin: 8.2 g/dL — ABNORMAL LOW (ref 12.0–15.0)
MCH: 23.9 pg — ABNORMAL LOW (ref 26.0–34.0)
MCHC: 33.6 g/dL (ref 30.0–36.0)
MCV: 71.1 fL — ABNORMAL LOW (ref 78.0–100.0)
Platelets: 117 10*3/uL — ABNORMAL LOW (ref 150–400)
RBC: 3.43 MIL/uL — ABNORMAL LOW (ref 3.87–5.11)
RDW: 15.9 % — ABNORMAL HIGH (ref 11.5–15.5)
WBC: 17.8 10*3/uL — ABNORMAL HIGH (ref 4.0–10.5)

## 2013-09-30 LAB — GLUCOSE, CAPILLARY
Glucose-Capillary: 170 mg/dL — ABNORMAL HIGH (ref 70–99)
Glucose-Capillary: 173 mg/dL — ABNORMAL HIGH (ref 70–99)
Glucose-Capillary: 175 mg/dL — ABNORMAL HIGH (ref 70–99)
Glucose-Capillary: 178 mg/dL — ABNORMAL HIGH (ref 70–99)
Glucose-Capillary: 250 mg/dL — ABNORMAL HIGH (ref 70–99)
Glucose-Capillary: 286 mg/dL — ABNORMAL HIGH (ref 70–99)

## 2013-09-30 LAB — BASIC METABOLIC PANEL
BUN: 50 mg/dL — ABNORMAL HIGH (ref 6–23)
CO2: 32 mEq/L (ref 19–32)
Calcium: 8.2 mg/dL — ABNORMAL LOW (ref 8.4–10.5)
Chloride: 114 mEq/L — ABNORMAL HIGH (ref 96–112)
Creatinine, Ser: 1.49 mg/dL — ABNORMAL HIGH (ref 0.50–1.10)
GFR calc Af Amer: 46 mL/min — ABNORMAL LOW (ref >90)
GFR calc non Af Amer: 40 mL/min — ABNORMAL LOW (ref >90)
Glucose, Bld: 175 mg/dL — ABNORMAL HIGH (ref 70–99)
Potassium: 2.6 mEq/L — CL (ref 3.5–5.1)
Sodium: 154 mEq/L — ABNORMAL HIGH (ref 135–145)

## 2013-09-30 MED ORDER — PANTOPRAZOLE SODIUM 40 MG IV SOLR
40.0000 mg | INTRAVENOUS | Status: DC
  Administered 2013-09-30: 40 mg via INTRAVENOUS
  Filled 2013-09-30 (×2): qty 40

## 2013-09-30 MED ORDER — INSULIN ASPART 100 UNIT/ML ~~LOC~~ SOLN
0.0000 [IU] | SUBCUTANEOUS | Status: DC
  Administered 2013-09-30: 11 [IU] via SUBCUTANEOUS
  Administered 2013-09-30 – 2013-10-01 (×2): 7 [IU] via SUBCUTANEOUS
  Administered 2013-10-01: 3 [IU] via SUBCUTANEOUS
  Administered 2013-10-01: 7 [IU] via SUBCUTANEOUS
  Administered 2013-10-01: 11 [IU] via SUBCUTANEOUS
  Administered 2013-10-01: 4 [IU] via SUBCUTANEOUS

## 2013-09-30 MED ORDER — FENTANYL CITRATE 0.05 MG/ML IJ SOLN: 12.5000 ug | INTRAMUSCULAR | Status: DC | PRN

## 2013-09-30 MED ORDER — POTASSIUM CHLORIDE 10 MEQ/50ML IV SOLN
10.0000 meq | INTRAVENOUS | Status: AC
  Administered 2013-09-30 (×10): 10 meq via INTRAVENOUS
  Filled 2013-09-30 (×6): qty 50

## 2013-09-30 MED ORDER — FREE WATER
400.0000 mL | Freq: Four times a day (QID) | Status: DC
  Administered 2013-09-30: 400 mL

## 2013-09-30 MED ORDER — WHITE PETROLATUM GEL
Status: AC
  Administered 2013-09-30: 0.2
  Filled 2013-09-30: qty 5

## 2013-09-30 MED ORDER — DEXTROSE 5 % IV SOLN
INTRAVENOUS | Status: DC
  Administered 2013-09-30 – 2013-10-01 (×3): via INTRAVENOUS

## 2013-09-30 NOTE — Progress Notes (Signed)
Continuous Fentanyl infusion stopped this morning at 08:27.  Remaining 60 cc in infusion bag wasted into sink with Sanjuana Kava, RN as witness at 11:47 am.

## 2013-09-30 NOTE — Progress Notes (Signed)
cCRITICAL VALUE ALERT  Critical value received:  Potassium=2.6  Date of notification:  09/30/13  Time of notification:  07:57  Critical value read back:yes  Nurse who received alert:  J. Raul Del BSN RN  MD notified (1st page):  Dr. Wendi Snipes  Time of first page:  08:12  MD notified (2nd page):  Time of second page:  Responding MD:  Dr. Wendi Snipes  Time MD responded:  Dr. Wendi Snipes

## 2013-09-30 NOTE — Procedures (Signed)
Extubation Procedure Note  Patient Details:   Name: Colleen [name] DOB: Apr 28, 1961 MRN: [id number]   Airway Documentation:     Evaluation  O2 sats: stable throughout Complications: No apparent complications Patient did tolerate procedure well. Bilateral Breath Sounds: Rhonchi Suctioning: Airway No  Hope Pigeon, MA 09/30/2013, 9:36 AM

## 2013-09-30 NOTE — Progress Notes (Signed)
Rehab Admissions Coordinator Note:  Patient was screened by Cleatrice Burke for appropriateness for an Inpatient Acute Rehab Consult by P.T. Noted just extubated this a.m. I would like to follow how pt progresses over the weekend before determining rehab destination options. Please order O.T. eval also. I will follow up on Monday.Cleatrice Burke 09/30/2013, 3:08 PM  I can be reached at [phone].

## 2013-09-30 NOTE — Evaluation (Signed)
Physical Therapy Evaluation Patient Details Name: Colleen [name] MRN: [id number] DOB: [birth year] Today's Date: 09/30/2013 Time: [id number] PT Time Calculation (min): 27 min  PT Assessment / Plan / Recommendation History of Present Illness  Patient is a 52 y/o female admitted with weakness and emesis found to be in DKA vs HONC with shock requiring pressors.  She states that she has diabetes but has not been watching her diet or taking metformin for around 2 years.  Developed respiratory failure requiring intubation on 10/18, diuresed 4L on 10/23 and was extubated 10/24.   Clinical Impression  Patient presents with decreased independence with mobility due to prolonged immobilization, and possible due to sedation, however may benefit from neuro consult due to impaired cognition and significant weakness.  PT to follow acutely to address deficits below.  Feel she may benefit from CIR level rehab prior to d/c home with family.    PT Assessment  Patient needs continued PT services    Follow Up Recommendations  CIR    Does the patient have the potential to tolerate intense rehabilitation    Potentially  Barriers to Discharge   mother plans to take pt to her home, but not physically able to provide more than min assist    Equipment Recommendations  Other (comment) (To be assessed as mobility improves)    Recommendations for Other Services Rehab consult;OT consult;Speech consult   Frequency Min 3X/week    Precautions / Restrictions Precautions Precaution Comments: multiple lines, no longer on pressors and off sedation   Pertinent Vitals/Pain No pain complaints.      Mobility  Bed Mobility Bed Mobility: Rolling Right;Right Sidelying to Sit;Sitting - Scoot to [employer] of Bed;Sit to Supine;Scooting to [facility] Rolling Right: 1: +2 Total assist Rolling Right: Patient Percentage: 10% Right Sidelying to Sit: 1: +2 Total assist;HOB elevated Right Sidelying to Sit: Patient Percentage: 10% Sitting  - Scoot to Edge of Bed: 2: Max assist Sit to Supine: 1: +2 Total assist;HOB flat Sit to Supine: Patient Percentage: 0% Scooting to HOB: 1: +2 Total assist Scooting to [facility]: Patient Percentage: 0% Details for Bed Mobility Assistance: attempted to increase participation with cueing, but pt slow to process and needed lots of physical assist for mobility Transfers Transfers: Lateral/Scoot Transfers Lateral/Scoot Transfers: 1: +2 Total assist Lateral Transfers: Patient Percentage: 0% Details for Transfer Assistance: scooting along edge of bed to head of bed, assist forward weight shift and pt not accepting weight into LE's; scooted along edge on bed pad        PT Diagnosis: Generalized weakness;Difficulty walking  PT Problem List: Decreased strength;Decreased cognition;Decreased activity tolerance;Decreased balance;Decreased mobility;Decreased knowledge of use of DME;Impaired tone PT Treatment Interventions: DME instruction;Balance training;Therapeutic exercise;Wheelchair mobility training;Therapeutic activities;Patient/family education;Functional mobility training;Neuromuscular re-education     PT Goals(Current goals can be found in the care plan section) Acute Rehab PT Goals Patient Stated Goal: To take pt home when able PT Goal Formulation: With family Time For Goal Achievement: 10/14/13 Potential to Achieve Goals: Fair  Visit Information  Last PT Received On: 09/30/13 Assistance Needed: +2 History of Present Illness: Patient is a 52 y/o female admitted with weakness and emesis found to be in DKA vs HONC with shock requiring pressors.  She states that she has diabetes but has not been watching her diet or taking metformin for around 2 years.  Developed respiratory failure requiring intubation on 10/18, diuresed 4L on 10/23 and was extubated 10/24.        Prior [place]  Living Family/patient expects to be discharged to:: Private residence Living Arrangements: Alone Type of  Home: [place]: None Prior Function Comments: worked as Librarian, academic at [employer]: Other (comment) (slow, slurred speech with decreased volume,)    Cognition  Cognition Arousal/Alertness: Lethargic;Suspect due to medications Behavior During Therapy: Flat affect Overall Cognitive Status: Difficult to assess Difficult to assess due to: Level of arousal    Extremity/Trunk Assessment Upper Extremity Assessment Upper Extremity Assessment: RUE deficits/detail;LUE deficits/detail RUE Deficits / Details: PROM Grossly WFL, lifts occasionally to command, but cannot hold, unable to grip toothette for oral care LUE Deficits / Details: PROM Grossly WFL, lifts occasionally to command, but cannot hold Lower Extremity Assessment Lower Extremity Assessment: RLE deficits/detail;LLE deficits/detail RLE Deficits / Details: Lifts lower leg to command x 1 antigravity, demonstrates PROM WFL, otherwise NT due to immobility, difficulty following commands LLE Deficits / Details: Did not lift leg to command antigravity, demonstrates PROM WFL, otherwise NT due to immobility, difficulty following commands   Balance Balance Balance Assessed: Yes Static Sitting Balance Static Sitting - Balance Support: Feet supported;No upper extremity supported Static Sitting - Level of Assistance: 3: Mod assist Static Sitting - Comment/# of Minutes: sat edge of bed approx 7 minutes with mod to max assist, increasing at times due to fatigue  End of Session PT - End of Session Equipment Utilized During Treatment: Gait belt Activity Tolerance: Patient limited by lethargy Patient left: in bed;with call bell/phone within reach;with family/visitor present Nurse Communication: Mobility status  GP     [facility] 09/30/2013, 2:35 PM  [place], [place] 09/30/2013

## 2013-09-30 NOTE — Progress Notes (Signed)
PULMONARY  / CRITICAL CARE MEDICINE  Name: Colleen [name] MRN: [id number] DOB: October 26, 1961    ADMISSION DATE:  09/21/2013 CONSULTATION DATE:  09/22/2013  REFERRING MD :  Dr. Stevie Kern PRIMARY SERVICE: PCCM  CHIEF COMPLAINT:  Weakness and vomiting  BRIEF PATIENT DESCRIPTION: 52 y/o female [facility] with weakness and emesis found to be in DKA vs HONC with shock requiring pressors. Intubated 10-18  SIGNIFICANT EVENTS / STUDIES:  10/15 presented to [facility], given IV fluids, IV insulin, and pressors 10/16 transferred to [facility] ICU 10/16 pulled out L IJ CVL 10/18 intubated for resp failure 10/20 increased support on vent 10/20 Renal US: Mild left renal pelviectasis 10/21 started bicarb drip for NAG acidosis 10/21 pressors started and weaned again 10/23 diuresed 4 L   LINES / TUBES: L IJ 10/16>>>10/16 10-18 R rad a line >> 10/24 10-18 ETT >> 10/24 10-17 L [place] CVL >>  CULTURES: Blood 10/15>>> E coli Urine 10/15>>> E coli MRSA PCR 10/15>>>neg Cdiff 10/16 >> neg GI pathogen panel 10/16>> NEG Resp culture 10/20 >>normal flora  ANTIBIOTICS: Ceftriaxone 10/15>>>10/16, 10-18>>10/20 Vancomycin 10/16>>>10-17, 10/20 >> 10/21 Zosyn 1016>>>10-18, 10/20 >>   SUBJECTIVE: No acute events overnight. Intermittently agitated. Passed SBT and extubated. Looks good post extubation  VITAL SIGNS: Temp:  [98.5 F (36.9 C)-100.2 F (37.9 C)] 100.2 F (37.9 C) (10/24 0600) Pulse Rate:  [80-103] 98 (10/24 0800) Resp:  [14-31] 19 (10/24 0800) BP: (97-204)/(52-90) 123/80 mmHg (10/24 0800) SpO2:  [95 %-100 %] 99 % (10/24 0800) Arterial Line BP: (84-193)/(58-109) 101/75 mmHg (10/24 0600) FiO2 (%):  [40 %-50 %] 40 % (10/24 0800) HEMODYNAMICS:   VENTILATOR SETTINGS: Vent Mode:  [-] PCV FiO2 (%):  [40 %-50 %] 40 % Set Rate:  [15 bmp] 15 bmp PEEP:  [5 cmH20-8 cmH20] 5 cmH20 Plateau Pressure:  [18 cmH20-37 cmH20] 20 cmH20 INTAKE / OUTPUT: Intake/Output   10/23 0701 - 10/24 0700 10/24 0701 - 10/25 0700   I.V. (mL/kg) 975 (10.6)    Other 60    NG/GT 1015    IV Piggyback 112.5    Total Intake(mL/kg) 2162.5 (23.5)    Urine (mL/kg/hr) 6150 (2.8)    Stool 200 (0.1)    Total Output 6350     Net -4187.5            PHYSICAL EXAMINATION: Gen: NAD, poorly oriented, + F/C HEENT: WNL CV: RRR, no murmur Resp: mildly coarse breath sounds, Good air movement, no wheeze Abd: SNTND, BS present, no guarding or organomegaly Ext: 1-2+ edema, 2+ DP pusles Neuro: opens eyes to tactile stimulation.  Skin: intact  LABS:  CBC  Recent Labs Lab 09/28/13 1455 09/29/13 0500 09/30/13 0500  HGB 7.8* 8.0* 8.2*  HCT 23.2* 23.6* 24.4*  WBC 18.3* 18.9* 17.8*  PLT 87* 92* 117*   Coag's No results found for this basename: APTT, INR,  in the last 72 hours BMET  Recent Labs Lab 09/26/13 1216 09/27/13 0445 09/28/13 0425 09/29/13 0500 09/30/13 0500  NA 139 140 144 146* 154*  K 4.4 4.3 3.6 3.0* 2.6*  CL 109 111 111 108 114*  CO2 18* 17* 22 25 32  GLUCOSE 171* 255* 193* 138* 175*  BUN 37* 46* 51* 60* 50*  CREATININE 2.09* 2.22* 2.17* 2.23* 1.49*  CALCIUM 7.3* 7.4* 8.2* 8.4 8.2*    Electrolytes Recent Labs     09/28/13  0425  09/29/13  0500  09/30/13  0500  CALCIUM  8.2*  8.4  8.2*   Sepsis Markers Recent Labs     09/28/13  0425  PROCALCITON  7.68   ABG Recent Labs     09/28/13  1638  PHART  7.400  PCO2ART  42.3  PO2ART  151.0*   Liver Enzymes No results found for this basename: AST, ALT, ALKPHOS, BILITOT, ALBUMIN,  in the last 72 hours Cardiac Enzymes Recent Labs     09/28/13  0425  PROBNP  2967.0*   Glucose Recent Labs     09/29/13  1206  09/29/13  1514  09/29/13  1935  09/30/13  0005  09/30/13  0355  09/30/13  0739  GLUCAP  155*  150*  199*  170*  173*  178*     CXR: NSC  ASSESSMENT / PLAN:  PULMONARY A: Acute respiratory failure ARDS Concern for HAP P: Monitor closely post-extubation Supplemental  O2 to maintain SpO2 > 92%  CARDIOVASCULAR A:  Shock, Sepsis, hypovolemic, and cardiogenic, resolved Cardiomyopathy, LVEF 30-35% P:  Monitor hemodynamics Cont PRN hydralazine for hypertension Cont PRN metoprolol for tachycardia  Renal A:   AKI, improving Suspect CKD Hyponatremia, resolved Metabolic Acidosis - resolved  Metabolic alkalosis Hypokalemia  Hypernatremia- free water deficit 5.5 L P:   Monitor BMET intermittently Correct electrolytes as indicated D5W ordered  GASTROINTESTINAL A:   Diarrhea, vomiting- resolved P:   SUP: PPI NPO until cognition improves  HEMATOLOGIC A:   Thrombocytopenia - probable compensated DIC, improving P:  DVT px: SCDs Avoid all heparins Monitor CBC intermittently Transfuse less than 7  INFECTIOUS A:   Severe sepsis E coli bacteremia due to Urinary source Severe watery diarrhea, C diff negative- resolved possible VAP P:   Micro and abx as above  ENDOCRINE A:   DM2   HONK/DKA resolved P:   Cont SSI  NEUROLOGIC A:   Acute encephalopathy - likely metabolic P:   Correct electrolytes Avoid BZDs   Laroy Apple, MD [place] Resident, PGY-2 09/30/2013, 8:05 AM  PCCM ATTENDING: I have interviewed and examined the patient and reviewed the database. I have formulated the assessment and plan as reflected in the note above with amendments made by me. 40 mins of direct critical care time provided  Merton Border, MD;  PCCM service; Mobile [phone]

## 2013-10-01 ENCOUNTER — Inpatient Hospital Stay (HOSPITAL_COMMUNITY): Payer: Federal, State, Local not specified - PPO

## 2013-10-01 LAB — BASIC METABOLIC PANEL
BUN: 30 mg/dL — ABNORMAL HIGH (ref 6–23)
BUN: 22 mg/dL (ref 6–23)
CO2: 30 mEq/L (ref 19–32)
CO2: 30 mEq/L (ref 19–32)
Calcium: 8.1 mg/dL — ABNORMAL LOW (ref 8.4–10.5)
Calcium: 8.1 mg/dL — ABNORMAL LOW (ref 8.4–10.5)
Chloride: 118 mEq/L — ABNORMAL HIGH (ref 96–112)
Chloride: 112 mEq/L (ref 96–112)
Creatinine, Ser: 0.99 mg/dL (ref 0.50–1.10)
Creatinine, Ser: 1.03 mg/dL (ref 0.50–1.10)
GFR calc Af Amer: 75 mL/min — ABNORMAL LOW (ref >90)
GFR calc Af Amer: 72 mL/min — ABNORMAL LOW (ref >90)
GFR calc non Af Amer: 65 mL/min — ABNORMAL LOW (ref >90)
GFR calc non Af Amer: 62 mL/min — ABNORMAL LOW (ref >90)
Glucose, Bld: 150 mg/dL — ABNORMAL HIGH (ref 70–99)
Glucose, Bld: 281 mg/dL — ABNORMAL HIGH (ref 70–99)
Potassium: 2.7 mEq/L — CL (ref 3.5–5.1)
Potassium: 3.3 mEq/L — ABNORMAL LOW (ref 3.5–5.1)
Sodium: 155 mEq/L — ABNORMAL HIGH (ref 135–145)
Sodium: 149 mEq/L — ABNORMAL HIGH (ref 135–145)

## 2013-10-01 LAB — GLUCOSE, CAPILLARY
Glucose-Capillary: 147 mg/dL — ABNORMAL HIGH (ref 70–99)
Glucose-Capillary: 164 mg/dL — ABNORMAL HIGH (ref 70–99)
Glucose-Capillary: 175 mg/dL — ABNORMAL HIGH (ref 70–99)
Glucose-Capillary: 235 mg/dL — ABNORMAL HIGH (ref 70–99)
Glucose-Capillary: 241 mg/dL — ABNORMAL HIGH (ref 70–99)
Glucose-Capillary: 243 mg/dL — ABNORMAL HIGH (ref 70–99)
Glucose-Capillary: 251 mg/dL — ABNORMAL HIGH (ref 70–99)

## 2013-10-01 LAB — CBC
HCT: 24.3 % — ABNORMAL LOW (ref 36.0–46.0)
Hemoglobin: 7.9 g/dL — ABNORMAL LOW (ref 12.0–15.0)
MCH: 23.4 pg — ABNORMAL LOW (ref 26.0–34.0)
MCHC: 32.5 g/dL (ref 30.0–36.0)
MCV: 72.1 fL — ABNORMAL LOW (ref 78.0–100.0)
Platelets: 149 10*3/uL — ABNORMAL LOW (ref 150–400)
RBC: 3.37 MIL/uL — ABNORMAL LOW (ref 3.87–5.11)
RDW: 15.9 % — ABNORMAL HIGH (ref 11.5–15.5)
WBC: 17.4 10*3/uL — ABNORMAL HIGH (ref 4.0–10.5)

## 2013-10-01 LAB — MAGNESIUM: Magnesium: 1.9 mg/dL (ref 1.5–2.5)

## 2013-10-01 MED ORDER — INSULIN ASPART 100 UNIT/ML ~~LOC~~ SOLN
0.0000 [IU] | Freq: Every day | SUBCUTANEOUS | Status: DC
  Administered 2013-10-01: 4 [IU] via SUBCUTANEOUS
  Administered 2013-10-02 – 2013-10-05 (×3): 2 [IU] via SUBCUTANEOUS
  Administered 2013-10-06: 22:00:00 via SUBCUTANEOUS

## 2013-10-01 MED ORDER — INSULIN ASPART 100 UNIT/ML ~~LOC~~ SOLN
4.0000 [IU] | Freq: Three times a day (TID) | SUBCUTANEOUS | Status: DC
  Administered 2013-10-01 – 2013-10-06 (×15): 4 [IU] via SUBCUTANEOUS

## 2013-10-01 MED ORDER — INSULIN ASPART 100 UNIT/ML ~~LOC~~ SOLN
0.0000 [IU] | Freq: Three times a day (TID) | SUBCUTANEOUS | Status: DC
  Administered 2013-10-01 – 2013-10-02 (×3): 3 [IU] via SUBCUTANEOUS
  Administered 2013-10-02: 8 [IU] via SUBCUTANEOUS
  Administered 2013-10-03: 3 [IU] via SUBCUTANEOUS
  Administered 2013-10-03: 5 [IU] via SUBCUTANEOUS
  Administered 2013-10-03: 3 [IU] via SUBCUTANEOUS
  Administered 2013-10-04: 2 [IU] via SUBCUTANEOUS
  Administered 2013-10-04 (×2): 3 [IU] via SUBCUTANEOUS
  Administered 2013-10-05: 5 [IU] via SUBCUTANEOUS
  Administered 2013-10-05: 2 [IU] via SUBCUTANEOUS
  Administered 2013-10-05: 3 [IU] via SUBCUTANEOUS
  Administered 2013-10-06 (×3): 5 [IU] via SUBCUTANEOUS
  Administered 2013-10-07 (×2): 3 [IU] via SUBCUTANEOUS
  Administered 2013-10-07: 5 [IU] via SUBCUTANEOUS
  Administered 2013-10-08 – 2013-10-09 (×2): 2 [IU] via SUBCUTANEOUS
  Administered 2013-10-09: 3 [IU] via SUBCUTANEOUS

## 2013-10-01 MED ORDER — INSULIN GLARGINE 100 UNIT/ML ~~LOC~~ SOLN
10.0000 [IU] | Freq: Every day | SUBCUTANEOUS | Status: DC
  Administered 2013-10-01 – 2013-10-02 (×2): 10 [IU] via SUBCUTANEOUS
  Filled 2013-10-01 (×3): qty 0.1

## 2013-10-01 MED ORDER — ENOXAPARIN SODIUM 40 MG/0.4ML ~~LOC~~ SOLN
40.0000 mg | SUBCUTANEOUS | Status: DC
  Administered 2013-10-01 – 2013-10-06 (×6): 40 mg via SUBCUTANEOUS
  Filled 2013-10-01 (×7): qty 0.4

## 2013-10-01 MED ORDER — FAMOTIDINE 20 MG PO TABS
20.0000 mg | ORAL_TABLET | Freq: Every day | ORAL | Status: DC
  Administered 2013-10-01 – 2013-10-09 (×9): 20 mg via ORAL
  Filled 2013-10-01 (×10): qty 1

## 2013-10-01 MED ORDER — CARVEDILOL 6.25 MG PO TABS
6.2500 mg | ORAL_TABLET | Freq: Two times a day (BID) | ORAL | Status: DC
  Administered 2013-10-01 – 2013-10-03 (×4): 6.25 mg via ORAL
  Filled 2013-10-01 (×6): qty 1

## 2013-10-01 MED ORDER — POTASSIUM CHLORIDE 10 MEQ/50ML IV SOLN
10.0000 meq | INTRAVENOUS | Status: AC
  Administered 2013-10-01 (×9): 10 meq via INTRAVENOUS
  Filled 2013-10-01 (×2): qty 50

## 2013-10-01 MED ORDER — DEXTROSE 5 % IV SOLN
1.0000 g | INTRAVENOUS | Status: DC
  Administered 2013-10-01 – 2013-10-03 (×3): 1 g via INTRAVENOUS
  Filled 2013-10-01 (×5): qty 10

## 2013-10-01 MED ORDER — ACETAMINOPHEN 160 MG/5ML PO SOLN
650.0000 mg | Freq: Four times a day (QID) | ORAL | Status: DC | PRN
  Filled 2013-10-01: qty 20.3

## 2013-10-01 MED ORDER — PANTOPRAZOLE SODIUM 40 MG PO TBEC: 40.0000 mg | DELAYED_RELEASE_TABLET | Freq: Every day | ORAL | Status: DC

## 2013-10-01 NOTE — Progress Notes (Signed)
[place] Progress Note Patient Name: Colleen [name] DOB: [birth year] MRN: [id number]  Date of Service  10/01/2013   HPI/Events of Note  Hypokalemia   eICU Interventions  Potassium replaced  Will check mag level   Intervention Category Intermediate Interventions: Electrolyte abnormality - evaluation and management  DETERDING,ELIZABETH 10/01/2013, 5:00 AM

## 2013-10-01 NOTE — Progress Notes (Signed)
PULMONARY  / CRITICAL CARE MEDICINE  Name: Colleen [name] MRN: [id number] DOB: [birth year]    ADMISSION DATE:  09/21/2013 CONSULTATION DATE:  09/22/2013  REFERRING MD :  Dr. Stevie Kern PRIMARY SERVICE: PCCM  CHIEF COMPLAINT:  Weakness and vomiting  BRIEF PATIENT DESCRIPTION: 52 y/o female presenting [facility] with weakness and emesis found to be in DKA vs HONC with shock requiring pressors. Intubated 10-18  SIGNIFICANT EVENTS / STUDIES:  10/15 - presented to [facility], given IV fluids, IV insulin, and pressors 10/16 - transferred to [facility] ICU, pulled out L IJ CVL 10/18 - intubated for resp failure 10/20 - increased support on vent. Renal US: Mild left renal pelviectasis 10/21 - started bicarb drip for NAG acidosis, pressors started and weaned again 10/23 - diuresed 4 L   LINES / TUBES: L IJ 10/16>>>10/16 10/18 R rad a line >> 10/24 10/18 ETT >> 10/24 10/17 L [place] CVL >>  CULTURES: Blood 10/15>>> E coli>>pan sens Urine 10/15>>> E coli>>pan sens MRSA PCR 10/15>>>neg Cdiff 10/16 >> neg GI pathogen panel 10/16>>neg Resp culture 10/20 >>neg  ANTIBIOTICS: Ceftriaxone 10/15>>>10/16, 10-18>>10/20, 10/25 >> Vancomycin 10/16>>>10-17, 10/20 >> 10/21 Zosyn 1016>>>10-18, 10/20 >> 10/25   SUBJECTIVE: Feels hungry.  Denies chest pain.  Breathing okay.  Denies nausea.  VITAL SIGNS: Temp:  [99.7 F (37.6 C)-100.4 F (38 C)] 99.7 F (37.6 C) (10/25 0700) Pulse Rate:  [79-105] 85 (10/25 0700) Resp:  [12-31] 12 (10/25 0700) BP: (133-173)/(60-83) 150/69 mmHg (10/25 0700) SpO2:  [92 %-100 %] 100 % (10/25 0824) Arterial Line BP: (136)/(76) 136/76 mmHg (10/24 1300) Weight:  [189 lb 2.5 oz (85.8 kg)] 189 lb 2.5 oz (85.8 kg) (10/25 0200) 4 liters [place]  INTAKE / OUTPUT: Intake/Output     10/24 0701 - 10/25 0700 10/25 0701 - 10/26 0700   I.V. (mL/kg) 2091.3 (24.4)    Other     NG/GT 365    IV Piggyback 750    Total Intake(mL/kg) 3206.3 (37.4)    Urine  (mL/kg/hr) 1675 (0.8)    Stool 500 (0.2)    Total Output 2175     Net +1031.3            PHYSICAL EXAMINATION: Gen: No distress HEENT: No sinus tenderness CV: regular Resp: no wheeze Abd: soft, non tender Ext: no edema Neuro: normal strength Skin: no rashes  LABS: CBC Recent Labs     09/29/13  0500  09/30/13  0500  10/01/13  0355  WBC  18.9*  17.8*  17.4*  HGB  8.0*  8.2*  7.9*  HCT  23.6*  24.4*  24.3*  PLT  92*  117*  149*    BMET Recent Labs     09/29/13  0500  09/30/13  0500  10/01/13  0355  NA  146*  154*  155*  K  3.0*  2.6*  2.7*  CL  108  114*  118*  CO2  25  32  30  BUN  60*  50*  30*  CREATININE  2.23*  1.49*  0.99  GLUCOSE  138*  175*  150*    Electrolytes Recent Labs     09/29/13  0500  09/30/13  0500  10/01/13  0355  10/01/13  0430  CALCIUM  8.4  8.2*  8.1*   --   MG   --    --    --   1.9    ABG Recent Labs  09/28/13  1638  PHART  7.400  PCO2ART  42.3  PO2ART  151.0*   Glucose Recent Labs     09/30/13  1547  09/30/13  1944  10/01/13  0024  10/01/13  0352  10/01/13  0725  10/01/13  1138  GLUCAP  286*  250*  251*  147*  164*  241*    Imaging Dg Chest Port 1 View  10/01/2013   CLINICAL DATA:  Respiratory failure, extubated  EXAM: PORTABLE CHEST - 1 VIEW  COMPARISON:  Prior chest x-ray 09/30/2013  FINDINGS: Interval extubation and removal of nasogastric tube. Left subclavian approach central venous catheter remains in good position with the tip at the superior cavoatrial junction. Stable cardiac and mediastinal contours. Persistent diffuse bilateral interstitial and airspace opacities favored to reflect pulmonary edema. Probable small bilateral pleural effusions and bibasilar atelectasis.  IMPRESSION: 1. Interval extubation and removal of nasogastric tube. Stable position of left subclavian catheter. 2. Persistent pulmonary edema and bibasilar atelectasis versus infiltrates.   Electronically Signed   By: Jacqulynn Cadet  M.D.   On: 10/01/2013 07:57   Dg Chest Port 1 View  09/30/2013   CLINICAL DATA:  hypoxia  EXAM: PORTABLE CHEST - 1 VIEW  COMPARISON:  None.  FINDINGS: The endotracheal tube tip is 3.6 cm above the carinal. Central catheter tip is at the cavoatrial junction. The nasogastric tube tip and side port are below the diaphragm. No pneumothorax. There is mild cardiac enlargement with interstitial edema. There is patchy airspace consolidation in the left base.  IMPRESSION: The tube and catheter positions are as described. No pneumothorax. Evidence of a degree of congestive heart failure. Cannot exclude superimposed pneumonia left base. These changes are similar to 1 day prior.   Electronically Signed   By: Lowella Grip M.D.   On: 09/30/2013 07:22       ASSESSMENT / PLAN:  PULMONARY A: Acute respiratory failure with ALI/ARDS in setting of UTI/bactermia/sepsis >> much improved. P: Bronchial hygiene F/u CXR intermittently Oxygen to keep SpO2 > 92% PRN BD's  CARDIOVASCULAR A:  Septic shock - resolved. Acute systolic cardiomyopathy - ? Related to sepsis. P:  Add low dose coreg 10/25 Hold ASA with thrombocytopenia Hold ACE inhibitor until sure renal fx stable Monitor on tele Will need cardiology evaluation at some point  Renal A:   Acute kidney injury in setting of septic shock >> much improved. Metabolic Acidosis - resolved  Hypokalemia Hypernatremia  P:   Continue D5W IV fluids Monitor renal fx, urine outpt Follow up and replace electrolytes as needed D/c foley  GASTROINTESTINAL A:   Diarrhea / vomiting - improved Nutrition P:   Change protonix to pepcid for SUP Start carb modified diet D/c rectal tube  HEMATOLOGIC A:   Thrombocytopenia secondary to sepsis; HIT panel negative from 09/23/13. Anemia of critical illness. P:  LMWH for DVT prevention F/u CBC Check iron levels  INFECTIOUS A:   Septic shock 2nd to E coli UTI with bacteremia. P:   Day 11/14 Abx >>  change back to rocephin 10/25  ENDOCRINE A:   DM2   HONK/DKA - resolved P:   SSI with lantus  NEUROLOGIC A:   Acute encephalopathy - likely metabolic >> resolved Deconditioning P:   Correct electrolytes Avoid BZDs PT consult  Noe Gens, NP-C [facility] Pgr: [phone] or [phone]   Reviewed above, examined pt, and documentation changes made as needed.  Transfer to SDU.  Will ask [facility] to assume care  from 10/26 and PCCM sign off.  Chesley Mires, MD [facility] Pulmonary/Critical Care 10/01/2013, 12:21 PM Pager:  [phone] After 3pm call: [phone]

## 2013-10-01 NOTE — Progress Notes (Signed)
CRITICAL VALUE ALERT  Critical value received: potassium 2.7  Date of notification:  10/01/13  Time of notification: 0450  Critical value read back: yes  Nurse who received alert:  Aldona Lento RN  Responding MD:  Warren Lacy  Time MD responded:  [phone]

## 2013-10-01 NOTE — Progress Notes (Signed)
Pt Arrived to room 3S10 Accompanied by [facility] and RN. Pt Tx to the bed and place on monitors. VSS. Pt has no complaints at this time. Pt assessed. Pt has no complaints at this time. Call bell in reach

## 2013-10-02 DIAGNOSIS — A419 Sepsis, unspecified organism: Secondary | ICD-10-CM

## 2013-10-02 DIAGNOSIS — B9620 Unspecified Escherichia coli [E. coli] as the cause of diseases classified elsewhere: Secondary | ICD-10-CM | POA: Diagnosis present

## 2013-10-02 DIAGNOSIS — I5023 Acute on chronic systolic (congestive) heart failure: Secondary | ICD-10-CM

## 2013-10-02 DIAGNOSIS — J9600 Acute respiratory failure, unspecified whether with hypoxia or hypercapnia: Secondary | ICD-10-CM

## 2013-10-02 DIAGNOSIS — E1110 Type 2 diabetes mellitus with ketoacidosis without coma: Secondary | ICD-10-CM

## 2013-10-02 LAB — TSH: TSH: 2.109 u[IU]/mL (ref 0.350–4.500)

## 2013-10-02 LAB — IRON AND TIBC
Iron: 64 ug/dL (ref 42–135)
Saturation Ratios: 31 % (ref 20–55)
TIBC: 206 ug/dL — ABNORMAL LOW (ref 250–470)
UIBC: 142 ug/dL (ref 125–400)

## 2013-10-02 LAB — CBC
HCT: 26 % — ABNORMAL LOW (ref 36.0–46.0)
Hemoglobin: 8.5 g/dL — ABNORMAL LOW (ref 12.0–15.0)
MCH: 23.5 pg — ABNORMAL LOW (ref 26.0–34.0)
MCHC: 32.7 g/dL (ref 30.0–36.0)
MCV: 72 fL — ABNORMAL LOW (ref 78.0–100.0)
Platelets: 231 10*3/uL (ref 150–400)
RBC: 3.61 MIL/uL — ABNORMAL LOW (ref 3.87–5.11)
RDW: 16.4 % — ABNORMAL HIGH (ref 11.5–15.5)
WBC: 17.8 10*3/uL — ABNORMAL HIGH (ref 4.0–10.5)

## 2013-10-02 LAB — T4, FREE: Free T4: 0.95 ng/dL (ref 0.80–1.80)

## 2013-10-02 LAB — FERRITIN: Ferritin: 482 ng/mL — ABNORMAL HIGH (ref 10–291)

## 2013-10-02 LAB — BASIC METABOLIC PANEL
BUN: 18 mg/dL (ref 6–23)
CO2: 27 mEq/L (ref 19–32)
Calcium: 7.7 mg/dL — ABNORMAL LOW (ref 8.4–10.5)
Chloride: 108 mEq/L (ref 96–112)
Creatinine, Ser: 0.89 mg/dL (ref 0.50–1.10)
GFR calc Af Amer: 86 mL/min — ABNORMAL LOW (ref >90)
GFR calc non Af Amer: 74 mL/min — ABNORMAL LOW (ref >90)
Glucose, Bld: 183 mg/dL — ABNORMAL HIGH (ref 70–99)
Potassium: 3 mEq/L — ABNORMAL LOW (ref 3.5–5.1)
Sodium: 144 mEq/L (ref 135–145)

## 2013-10-02 LAB — HEMOGLOBIN A1C
Hgb A1c MFr Bld: 13.4 % — ABNORMAL HIGH (ref <5.7)
Mean Plasma Glucose: 338 mg/dL — ABNORMAL HIGH (ref <117)

## 2013-10-02 LAB — GLUCOSE, CAPILLARY
Glucose-Capillary: 188 mg/dL — ABNORMAL HIGH (ref 70–99)
Glucose-Capillary: 176 mg/dL — ABNORMAL HIGH (ref 70–99)
Glucose-Capillary: 263 mg/dL — ABNORMAL HIGH (ref 70–99)
Glucose-Capillary: 232 mg/dL — ABNORMAL HIGH (ref 70–99)

## 2013-10-02 LAB — PHOSPHORUS: Phosphorus: 2.3 mg/dL (ref 2.3–4.6)

## 2013-10-02 LAB — MAGNESIUM: Magnesium: 1.8 mg/dL (ref 1.5–2.5)

## 2013-10-02 MED ORDER — POTASSIUM CHLORIDE CRYS ER 20 MEQ PO TBCR: 40.0000 meq | EXTENDED_RELEASE_TABLET | Freq: Two times a day (BID) | ORAL | Status: DC

## 2013-10-02 MED ORDER — POTASSIUM CHLORIDE CRYS ER 20 MEQ PO TBCR
40.0000 meq | EXTENDED_RELEASE_TABLET | ORAL | Status: AC
  Administered 2013-10-02 (×2): 40 meq via ORAL
  Filled 2013-10-02 (×2): qty 2

## 2013-10-02 MED ORDER — SALINE SPRAY 0.65 % NA SOLN
1.0000 | NASAL | Status: DC | PRN
  Filled 2013-10-02: qty 44

## 2013-10-02 MED ORDER — FUROSEMIDE 10 MG/ML IJ SOLN
40.0000 mg | Freq: Three times a day (TID) | INTRAMUSCULAR | Status: DC
  Administered 2013-10-02 – 2013-10-03 (×3): 40 mg via INTRAVENOUS
  Filled 2013-10-02 (×5): qty 4

## 2013-10-02 NOTE — Progress Notes (Signed)
[place] Progress Note Patient Name: Colleen [name] DOB: 1961-10-08 MRN: [id number]  Date of Service  10/02/2013   HPI/Events of Note   Hypokalemia  eICU Interventions  Potassium replaced   Intervention Category Minor Interventions: Electrolytes abnormality - evaluation and management  Latrelle Fuston 10/02/2013, 6:42 AM

## 2013-10-02 NOTE — Progress Notes (Addendum)
[facility] Progress Note  TEAM 1 - Stepdown/ICU TEAM   Colleen [name] [id number] DOB: 1961-02-14 DOA: 09/21/2013 PCP: No PCP Per Patient  Brief narrative: 52 y/o female [facility] with weakness and emesis found to be in DKA vs HONC with shock requiring pressors. Intubated 10-18  SIGNIFICANT EVENTS / STUDIES:  10/15 - presented to [facility], given IV fluids, IV insulin, and pressors  Blood 10/15>>> E coli>>pan sens  Urine 10/15>>> E coli>>pan sens 10/16 - transferred to [facility] ICU, pulled out L IJ CVL  10/18 - intubated for resp failure  10/20 - increased support on vent. Renal US: Mild left renal pelviectasis  10/21 - started bicarb drip for NAG acidosis, pressors started and weaned again  10/23 - diuresed 4 L   Assessment/Plan: Principal Problem:   Septic shock(785.52) - due to e coli UTI and bacteremia  Active Problems:   Acute respiratory failure with hypoxia   ARDS (adult respiratory distress syndrome) - improved    Acute on chronic systolic heart failure - appears fluid over loaded now - requiring 3 L of O2 to keep pulse ox 94% - start Lasix and follow I and O   DKA  - resolved - check A1c    Acute renal insufficiency - cr increased to max of 2.23 with GFR of 28 on 10/23 and has since been improving- follow with diuresis  Hypokalemia - cont to replace  Anemia - likely of chronic disease based upon Anemia panel  Code Status: full code Family Communication: mother and fiance Disposition Plan: follow in sDU  Antibiotics: Ceftriaxone 10/15>>>10/16, 10-18>>10/20, 10/25 >>  Vancomycin 10/16>>>10-17, 10/20 >> 10/21  Zosyn 1016>>>10-18, 10/20 >> 10/25   DVT prophylaxis: lovenox  HPI/Subjective: Pt c/o dryness of her nose and sinuses. No other complaints. Discussed her pulm edema and initiation of diuretics- foley has been removed and she is OK with a bedside commode which I will order. Eating about  half her meals, no consitpation   Objective: Blood pressure 150/71, pulse 90, temperature 99.6 F (37.6 C), temperature source Oral, resp. rate 27, height 5\' 4"  (1.626 m), weight 91.5 kg (201 lb 11.5 oz), last menstrual period 09/14/2013, SpO2 94.00%.  Intake/Output Summary (Last 24 hours) at 10/02/13 1522 Last data filed at 10/02/13 0700  Gross per 24 hour  Intake   1200 ml  Output    950 ml  Net    250 ml     Exam: General: No acute respiratory distress- very weak and having difficultly sitting up in bed on her own Lungs: crackles up to mid lungs b/l  Cardiovascular: Regular rate and rhythm without murmur gallop or rub normal S1 and S2 Abdomen: Nontender, nondistended, soft, bowel sounds positive, no rebound, no ascites, no appreciable mass Extremities: No significant cyanosis, clubbing,-edema of left hand noted  Data Reviewed: Basic Metabolic Panel:  Recent Labs Lab 09/29/13 0500 09/30/13 0500 10/01/13 0355 10/01/13 0430 10/01/13 1500 10/02/13 0500  NA 146* 154* 155*  --  149* 144  K 3.0* 2.6* 2.7*  --  3.3* 3.0*  CL 108 114* 118*  --  112 108  CO2 25 32 30  --  30 27  GLUCOSE 138* 175* 150*  --  281* 183*  BUN 60* 50* 30*  --  22 18  CREATININE 2.23* 1.49* 0.99  --  1.03 0.89  CALCIUM 8.4 8.2* 8.1*  --  8.1* 7.7*  MG  --   --   --  1.9  --  1.8  PHOS  --   --   --   --   --  2.3   Liver Function Tests: No results found for this basename: AST, ALT, ALKPHOS, BILITOT, PROT, ALBUMIN,  in the last 168 hours No results found for this basename: LIPASE, AMYLASE,  in the last 168 hours No results found for this basename: AMMONIA,  in the last 168 hours CBC:  Recent Labs Lab 09/28/13 1455 09/29/13 0500 09/30/13 0500 10/01/13 0355 10/02/13 0500  WBC 18.3* 18.9* 17.8* 17.4* 17.8*  HGB 7.8* 8.0* 8.2* 7.9* 8.5*  HCT 23.2* 23.6* 24.4* 24.3* 26.0*  MCV 69.7* 69.6* 71.1* 72.1* 72.0*  PLT 87* 92* 117* 149* 231   Cardiac Enzymes: No results found for this basename:  CKTOTAL, CKMB, CKMBINDEX, TROPONINI,  in the last 168 hours BNP (last 3 results)  Recent Labs  09/28/13 0425  PROBNP 2967.0*   CBG:  Recent Labs Lab 10/01/13 0725 10/01/13 1138 10/01/13 1520 10/02/13 0816 10/02/13 1216  GLUCAP 164* 241* 243* 188* 176*    Recent Results (from the past 240 hour(s))  CULTURE, RESPIRATORY (NON-EXPECTORATED)     Status: None   Collection Time    09/26/13  4:38 PM      Result Value Range Status   Specimen Description TRACHEAL ASPIRATE   Final   Special Requests NONE   Final   Gram Stain     Final   Value: FEW WBC PRESENT, PREDOMINANTLY PMN     NO SQUAMOUS EPITHELIAL CELLS SEEN     NO ORGANISMS SEEN     Performed at [facility]   Culture     Final   Value: Non-Pathogenic Oropharyngeal-type Flora Isolated.     Performed at [facility]   Report Status 09/28/2013 FINAL   Final     Studies:  Recent x-ray studies have been reviewed in detail by the Attending Physician  Scheduled Meds:  Scheduled Meds: . carvedilol  6.25 mg Oral BID WC  . cefTRIAXone (ROCEPHIN)  IV  1 g Intravenous Q24H  . chlorhexidine  15 mL Mouth Rinse BID  . enoxaparin (LOVENOX) injection  40 mg Subcutaneous Q24H  . famotidine  20 mg Oral Daily  . furosemide  40 mg Intravenous Q8H  . insulin aspart  0-15 Units Subcutaneous TID WC  . insulin aspart  0-5 Units Subcutaneous QHS  . insulin aspart  4 Units Subcutaneous TID WC  . insulin glargine  10 Units Subcutaneous QHS   Continuous Infusions: . sodium chloride 20 mL/hr at 09/30/13 0700    Time spent on care of this patient: 76 min  [facility], MD  [facility] Office  [phone] Pager - Text Page per Shea Evans as per below:  On-Call/Text Page:      [email or web address]      password [email or web address]  If 7PM-7AM, please contact night-coverage [email or web address] Password [email or web address] 10/02/2013, 3:22 PM   LOS: 11 days

## 2013-10-03 LAB — BASIC METABOLIC PANEL
BUN: 14 mg/dL (ref 6–23)
CO2: 27 mEq/L (ref 19–32)
Calcium: 8 mg/dL — ABNORMAL LOW (ref 8.4–10.5)
Chloride: 108 mEq/L (ref 96–112)
Creatinine, Ser: 0.85 mg/dL (ref 0.50–1.10)
GFR calc Af Amer: >90 mL/min (ref >90)
GFR calc non Af Amer: 78 mL/min — ABNORMAL LOW (ref >90)
Glucose, Bld: 194 mg/dL — ABNORMAL HIGH (ref 70–99)
Potassium: 3.7 mEq/L (ref 3.5–5.1)
Sodium: 143 mEq/L (ref 135–145)

## 2013-10-03 LAB — GLUCOSE, CAPILLARY
Glucose-Capillary: 191 mg/dL — ABNORMAL HIGH (ref 70–99)
Glucose-Capillary: 155 mg/dL — ABNORMAL HIGH (ref 70–99)
Glucose-Capillary: 219 mg/dL — ABNORMAL HIGH (ref 70–99)
Glucose-Capillary: 234 mg/dL — ABNORMAL HIGH (ref 70–99)

## 2013-10-03 LAB — CBC
HCT: 27.2 % — ABNORMAL LOW (ref 36.0–46.0)
Hemoglobin: 8.6 g/dL — ABNORMAL LOW (ref 12.0–15.0)
MCH: 23 pg — ABNORMAL LOW (ref 26.0–34.0)
MCHC: 31.6 g/dL (ref 30.0–36.0)
MCV: 72.7 fL — ABNORMAL LOW (ref 78.0–100.0)
Platelets: 262 10*3/uL (ref 150–400)
RBC: 3.74 MIL/uL — ABNORMAL LOW (ref 3.87–5.11)
RDW: 16.7 % — ABNORMAL HIGH (ref 11.5–15.5)
WBC: 15.6 10*3/uL — ABNORMAL HIGH (ref 4.0–10.5)

## 2013-10-03 MED ORDER — INSULIN GLARGINE 100 UNIT/ML ~~LOC~~ SOLN
16.0000 [IU] | Freq: Every day | SUBCUTANEOUS | Status: DC
  Administered 2013-10-03: 16 [IU] via SUBCUTANEOUS
  Filled 2013-10-03 (×2): qty 0.16

## 2013-10-03 MED ORDER — CARVEDILOL 12.5 MG PO TABS
12.5000 mg | ORAL_TABLET | Freq: Two times a day (BID) | ORAL | Status: DC
  Administered 2013-10-03 – 2013-10-05 (×5): 12.5 mg via ORAL
  Filled 2013-10-03 (×10): qty 1

## 2013-10-03 MED ORDER — FUROSEMIDE 10 MG/ML IJ SOLN
40.0000 mg | Freq: Two times a day (BID) | INTRAMUSCULAR | Status: DC
  Administered 2013-10-04 – 2013-10-06 (×5): 40 mg via INTRAVENOUS
  Filled 2013-10-03 (×8): qty 4

## 2013-10-03 NOTE — Progress Notes (Signed)
[facility] Progress Note [place] TEAM 1 - Stepdown/ICU TEAM   Colleen [name] [id number] DOB: 1961-06-22 DOA: 09/21/2013 PCP: No PCP Per Patient  Brief narrative: 52 y/o female with signif hx only for DM who presented to [facility] with weakness and emesis found to be in DKA vs HONK with shock requiring pressors. Intubated 10-18.  Admitted to [facility] service initially.  SIGNIFICANT EVENTS / STUDIES:  10/15 - presented [facility], given IV fluids, IV insulin, and pressors  10/15 - Blood >>> E coli>>pan sens  10/15 Urine >>> E coli>>pan sens 10/16 - transferred to [facility] ICU, pulled out L IJ CVL  10/18 - intubated for resp failure  10/20 - increased support on vent. Renal US: Mild left renal pelviectasis  10/21 - started bicarb drip for NAG acidosis, pressors started and weaned again  10/23 - diuresed 4 L  10/26 - [facility] assumed care of pt   Assessment/Plan:  Septic shock - due to E coli UTI and bacteremia - resolved - to complete 14 day abx course   Acute respiratory failure with hypoxia due to ARDS / ALI - resolved   Acute systolic heart failure - ?due to septic shock alone - will need repeat echo in outpt f/u once clinically normalized  - better compensated at this time  - follow I and O   DKA / HONK - uncontrolled DM  - DKA/HONK resolved - A1c markedly elevated at 13.4 - pt was reportedly on no prior outpt meds - clearly needs insulin - diabetic teaching ongoing   Acute renal insufficiency - cr reached peak of 2.23 with GFR of 28 on 10/23 and has since been improving- follow with diuresis  Hypokalemia - much improved w/ repletion - cont to follow   Thrombocytopenia secondary to gram negative sepsis HIT panel negative 0/17/14 - resolved   Anemia - likely of chronic disease based upon Anemia panel - will need outpt f/u once more stable   Code Status: FULL  Family Communication: Spoke with patient and mother at bedside Disposition Plan: Stable  for transfer to a medical bed - begin PT/OT  Antibiotics: Ceftriaxone 10/15, 10-18>>10/20, 10/25 >>  Vancomycin 10/16, 10/20 >> 10/21  Zosyn 10/16>>>10/17, 10/20 >> 10/25  DVT prophylaxis: lovenox  HPI/Subjective: Patient is in very good spirits today.  She denies any complaints whatsoever.  She is very anxious to be transferred out of the step down unit.  She specifically denies shortness of breath fevers chills or abdominal pain.  She has a much better appetite today.  Objective: Blood pressure 145/67, pulse 103, temperature 100.6 F (38.1 C), temperature source Axillary, resp. rate 23, height 5\' 4"  (1.626 m), weight 89.7 kg (197 lb 12 oz), last menstrual period 09/14/2013, SpO2 100.00%.  Intake/Output Summary (Last 24 hours) at 10/03/13 1403 Last data filed at 10/03/13 0845  Gross per 24 hour  Intake    960 ml  Output   2153 ml  Net  -1193 ml   Exam: General: No acute respiratory distress Lungs: Mild scattered diffuse crackles with no wheeze Cardiovascular: Regular rate and rhythm without murmur gallop or rub normal S1 and S2 Abdomen: Nontender, nondistended, soft, bowel sounds positive, no rebound, no ascites, no appreciable mass Extremities: No significant cyanosis, clubbing - trace diffuse edema appreciable  Data Reviewed: Basic Metabolic Panel:  Recent Labs Lab 09/30/13 0500 10/01/13 0355 10/01/13 0430 10/01/13 1500 10/02/13 0500 10/03/13 0420  NA 154* 155*  --  149* 144 143  K 2.6* 2.7*  --  3.3* 3.0* 3.7  CL 114* 118*  --  112 108 108  CO2 32 30  --  30 27 27   GLUCOSE 175* 150*  --  281* 183* 194*  BUN 50* 30*  --  22 18 14   CREATININE 1.49* 0.99  --  1.03 0.89 0.85  CALCIUM 8.2* 8.1*  --  8.1* 7.7* 8.0*  MG  --   --  1.9  --  1.8  --   PHOS  --   --   --   --  2.3  --    Liver Function Tests: No results found for this basename: AST, ALT, ALKPHOS, BILITOT, PROT, ALBUMIN,  in the last 168 hours  CBC:  Recent Labs Lab 09/29/13 0500 09/30/13 0500  10/01/13 0355 10/02/13 0500 10/03/13 0420  WBC 18.9* 17.8* 17.4* 17.8* 15.6*  HGB 8.0* 8.2* 7.9* 8.5* 8.6*  HCT 23.6* 24.4* 24.3* 26.0* 27.2*  MCV 69.6* 71.1* 72.1* 72.0* 72.7*  PLT 92* 117* 149* 231 262   CBG:  Recent Labs Lab 10/02/13 1216 10/02/13 1717 10/02/13 2125 10/03/13 0740 10/03/13 1155  GLUCAP 176* 263* 232* 191* 155*    Recent Results (from the past 240 hour(s))  CULTURE, RESPIRATORY (NON-EXPECTORATED)     Status: None   Collection Time    09/26/13  4:38 PM      Result Value Range Status   Specimen Description TRACHEAL ASPIRATE   Final   Special Requests NONE   Final   Gram Stain     Final   Value: FEW WBC PRESENT, PREDOMINANTLY PMN     NO SQUAMOUS EPITHELIAL CELLS SEEN     NO ORGANISMS SEEN     Performed at [facility]   Culture     Final   Value: Non-Pathogenic Oropharyngeal-type [place] Isolated.     Performed at [facility]   Report Status 09/28/2013 FINAL   Final     Studies:  Recent x-ray studies have been reviewed in detail by the Attending Physician  Scheduled Meds:  Scheduled Meds: . carvedilol  6.25 mg Oral BID WC  . cefTRIAXone (ROCEPHIN)  IV  1 g Intravenous Q24H  . chlorhexidine  15 mL Mouth Rinse BID  . enoxaparin (LOVENOX) injection  40 mg Subcutaneous Q24H  . famotidine  20 mg Oral Daily  . furosemide  40 mg Intravenous Q8H  . insulin aspart  0-15 Units Subcutaneous TID WC  . insulin aspart  0-5 Units Subcutaneous QHS  . insulin aspart  4 Units Subcutaneous TID WC  . insulin glargine  10 Units Subcutaneous QHS   Time spent on care of this patient: 35 min  MCCLUNG,JEFFREY T, MD  [facility] Office  [phone] Pager - Text Page per Shea Evans as per below:  On-Call/Text Page:      [email or web address]      password [email or web address]  If 7PM-7AM, please contact night-coverage [email or web address] Password [email or web address] 10/03/2013, 2:03 PM   LOS: 12 days

## 2013-10-03 NOTE — Progress Notes (Signed)
Physical Therapy Treatment Patient Details Name: Colleen [name] MRN: [id number] DOB: 1961-02-22 Today's Date: 10/03/2013 Time: [id number] PT Time Calculation (min): 25 min  PT Assessment / Plan / Recommendation  History of Present Illness Patient is a 52 y/o female admitted with weakness and emesis found to be in DKA vs HONC with shock requiring pressors.  She states that she has diabetes but has not been watching her diet or taking metformin for around 2 years.  Developed respiratory failure requiring intubation on 10/18, diuresed 4L on 10/23 and was extubated 10/24.    PT Comments   Pt much improved from mobility stand point however remains to have cognitive/memory deficts as well as balance impairments and decreased activity tolerance. Anticipate pt to be safe for d/c home with mother to provide 24/7 supervision/assist with use of RW for ambulation. Pt to benefit from HHPT and then transition to outpt PT for balance, energy conservation and cognitive deficits.   Follow Up Recommendations  Home health PT;Supervision/Assistance - 24 hour     Does the patient have the potential to tolerate intense rehabilitation     Barriers to Discharge        Equipment Recommendations  Rolling walker with 5" wheels    Recommendations for Other Services    Frequency Min 3X/week   Progress towards PT Goals Progress towards PT goals: Progressing toward goals  Plan Discharge plan needs to be updated    Precautions / Restrictions Precautions Precautions: Fall Restrictions Weight Bearing Restrictions: No   Pertinent Vitals/Pain *denies pain    Mobility  Bed Mobility Bed Mobility: Supine to Sit Supine to Sit: 4: Min guard;With rails;HOB elevated Sitting - Scoot to Edge of Bed: 4: Min guard Details for Bed Mobility Assistance: pt uses bed rail  Transfers Transfers: Sit to Stand;Stand to Sit Sit to Stand: 4: Min assist;With upper extremity assist;From bed Stand to Sit: 4: Min guard;With upper  extremity assist;To chair/3-in-1 Details for Transfer Assistance: v/c's for safety and hand placement, increased time. bilat LE instabilty upon initial stand Ambulation/Gait Ambulation/Gait Assistance: 4: Min assist Ambulation Distance (Feet): 150 Feet (x2) Assistive device: Rolling walker;None Ambulation/Gait Assistance Details: initially began amb with RW however pt strongly desired to amb "on my own" Pt with noted bilat LE instability/shakiness. Pt requested to sit down due to "I just don't want to fall. Pt with noted SOB on 4 Lo2 via [place]. Pt with increased stabiltiy with RW. Pt educated on benefits of RW for energy conservation and increased safety with long distance ambulation. Pt agreeable. Gait Pattern: Step-through pattern;Decreased stride length;Wide base of support;Shuffle Gait velocity: initally fast requiring v/c's to slow down for safety and energy conservation Stairs: No    Exercises     PT Diagnosis:    PT Problem List:   PT Treatment Interventions:     PT Goals (current goals can now be found in the care plan section) Acute Rehab PT Goals Patient Stated Goal: to walk by herself  Visit Information  Last PT Received On: 10/03/13 Assistance Needed: +1 (2nd person nice for lines/chair follow) History of Present Illness: Patient is a 52 y/o female admitted with weakness and emesis found to be in DKA vs HONC with shock requiring pressors.  She states that she has diabetes but has not been watching her diet or taking metformin for around 2 years.  Developed respiratory failure requiring intubation on 10/18, diuresed 4L on 10/23 and was extubated 10/24.     Subjective Data  Subjective: Pt reports "  I've been getting up to the commode with my boyfriend." Patient Stated Goal: to walk by herself   Cognition  Cognition Arousal/Alertness: Awake/alert Behavior During Therapy: WFL for tasks assessed/performed Overall Cognitive Status: Impaired/Different from baseline Area of  Impairment: Attention;Memory;Safety/judgement;Awareness;Problem solving Current Attention Level: Selective Memory: Decreased short-term memory (unable to recall information provided to pt at beginning of session Safety/Judgement: Decreased awareness of deficits;Decreased awareness of safety Awareness: Intellectual (aware she could fall but doesn't want to use walker) Problem Solving: Slow processing;Requires verbal cues General Comments: pt with impaired short term memory. pt unable to recall information given at beginning of session at the end of session    Balance  Static Sitting Balance Static Sitting - Balance Support: Feet supported;No upper extremity supported Static Sitting - Level of Assistance: 5: Stand by assistance Static Sitting - Comment/# of Minutes: 5 min  End of Session PT - End of Session Equipment Utilized During Treatment: Gait belt;Oxygen (4Lo2 via [place]) Activity Tolerance: Patient tolerated treatment well Patient left: in chair;with call bell/phone within reach Nurse Communication: Mobility status   GP     Kingsley Callander 10/03/2013, 11:53 AM   Kittie Plater, PT, DPT Pager #: [phone] Office #: [phone]

## 2013-10-03 NOTE — Progress Notes (Signed)
Clinical Social Work Department BRIEF PSYCHOSOCIAL ASSESSMENT 10/03/2013  Patient:  Colleen [name], Colleen [name]     Account Number:  [id number]     Admit date:  09/21/2013  Clinical Social Worker:  Pete Pelt, CLINICAL SOCIAL WORKER  Date/Time:  10/03/2013 02:20 PM  Referred by:  Physician  Date Referred:  10/03/2013 Referred for  SNF Placement  Other - See comment   Other Referral:   financial resources   Interview type:  Patient Other interview type:   Pt's aunt was also at the bedside    PSYCHOSOCIAL DATA Living Status:  ALONE Admitted from facility:   Level of care:   Primary support name:  Colleen [name] [id number] Primary support relationship to patient:  PARENT Degree of support available:   Pt has a good support system from family    CURRENT CONCERNS Current Concerns  Post-Acute Placement   Other Concerns:    SOCIAL WORK ASSESSMENT / PLAN CSW met with the Pt and aunt at the bedside. Pt was alert and oriented x's 4. Pt was speaking with her mother on the phone and able to communicate that "she will be moving to another unit and going home soon." CSW introduced self and reason for the visit. Pt was unaware that an order for assistance was placed. Pt stated that she is going to d/c home with her mother and will be able to transport back and forth to [place]. At the time of admission the Pt was residing here in [place] but will be staying with mother during recovery (address: [address] [place], [place] [address]). Pt is in good spirits and she does not feel that she will need SNF placement at this time. PT has recommended HHPT.  Pt voiced no needs for financial resources at this time, however the CSW provided some resources for when the Pt returns to the area.   Assessment/plan status:  Information/Referral to [employer] Other assessment/ plan:   Information/referral to community resources:   CSW provided financial resources for the Pt in the [employer]  area.    PATIENT'S/FAMILY'S RESPONSE TO PLAN OF CARE: Pt was appreciative for assistance      Pete Pelt, [facility].  [phone]

## 2013-10-03 NOTE — Progress Notes (Signed)
Inpatient Diabetes Program Recommendations  AACE/ADA: New Consensus Statement on Inpatient Glycemic Control (2013)  Target Ranges:  Prepandial:   less than 140 mg/dL      Peak postprandial:   less than 180 mg/dL (1-2 hours)      Critically ill patients:  140 - 180 mg/dL   Pt anxious to transfer to [facility] [place].  She was eager to learn how to use the insulin pen. Demonstrated how to draw up and administer( including air shot), storage, and how to dispose of the needles. Pt now needs to learn how to check her cbg's. She will need a cbg meter Rx at discharge. Will order meter teaching through videos and using the floor meter here when glucose is checked. Thank you, Rosita Kea, RN, CNS, Diabetes Coordinator [phone])

## 2013-10-03 NOTE — Progress Notes (Signed)
NUTRITION FOLLOW UP  Intervention:    No intervention needed at this time.  OP diabetes diet education has been ordered.  Nutrition Dx:   Inadequate oral intake related to inability to eat as evidenced by NPO status. Resolved.  No new nutrition diagnosis at this time.  New Goal:   Intake to meet >90% of estimated nutrition needs. Met.  Monitor:   PO intake, weight trend, labs.  Assessment:   Patient admitted from [place] on 10/15 with weakness and emesis, found to be in DKA vs HONC with shock requiring pressors.   Patient required intubation for ARDS from 10/18 to 10/24. Received Oxepa TF during that time.   Patient was extubated on 10/24. TF discontinued. Diet has been advanced to CHO-modified. Patient is consuming 100% of meals. Diet education was previously addressed. OP diabetes education has been ordered.   Height: Ht Readings from Last 1 Encounters:  09/21/13 5\' 4"  (1.626 m)    Weight Status:  Increased with edema Wt Readings from Last 1 Encounters:  10/03/13 197 lb 12 oz (89.7 kg)  09/29/13  203 lb 4.2 oz (92.2 kg)  09/26/13  191 lb 9.3 oz (86.9 kg)   Re-estimated needs:  Kcal: 1800-2000 Protein: 90-100 gm Fluid: 1.8-2 L  Skin: No wounds  Diet Order: Carb Control    Intake/Output Summary (Last 24 hours) at 10/03/13 1520 Last data filed at 10/03/13 0845  Gross per 24 hour  Intake    960 ml  Output   2153 ml  Net  -1193 ml    Last BM: 10/27 (flexiseal removed this morning)  Labs:   Recent Labs Lab 10/01/13 0355 10/01/13 0430 10/01/13 1500 10/02/13 0500 10/03/13 0420  NA 155*  --  149* 144 143  K 2.7*  --  3.3* 3.0* 3.7  CL 118*  --  112 108 108  CO2 30  --  30 27 27   BUN 30*  --  22 18 14   CREATININE 0.99  --  1.03 0.89 0.85  CALCIUM 8.1*  --  8.1* 7.7* 8.0*  MG  --  1.9  --  1.8  --   PHOS  --   --   --  2.3  --   GLUCOSE 150*  --  281* 183* 194*    CBG (last 3)   Recent Labs  10/02/13 2125 10/03/13 0740  10/03/13 1155  GLUCAP 232* 191* 155*    Scheduled Meds: . carvedilol  12.5 mg Oral BID WC  . cefTRIAXone (ROCEPHIN)  IV  1 g Intravenous Q24H  . chlorhexidine  15 mL Mouth Rinse BID  . enoxaparin (LOVENOX) injection  40 mg Subcutaneous Q24H  . famotidine  20 mg Oral Daily  . [START ON 10/04/2013] furosemide  40 mg Intravenous Q12H  . insulin aspart  0-15 Units Subcutaneous TID WC  . insulin aspart  0-5 Units Subcutaneous QHS  . insulin aspart  4 Units Subcutaneous TID WC  . insulin glargine  16 Units Subcutaneous QHS    Continuous Infusions: . sodium chloride 20 mL/hr at 09/30/13 0700    [name], RD, LDN, [place] Pager [phone] After Hours Pager [phone]

## 2013-10-03 NOTE — Progress Notes (Signed)
Utilization review completed.  

## 2013-10-03 NOTE — Progress Notes (Signed)
Noted functional progress with therapy . Home Health is recommended. [id number]

## 2013-10-04 DIAGNOSIS — IMO0002 Reserved for concepts with insufficient information to code with codable children: Secondary | ICD-10-CM | POA: Diagnosis present

## 2013-10-04 DIAGNOSIS — E1165 Type 2 diabetes mellitus with hyperglycemia: Secondary | ICD-10-CM | POA: Diagnosis present

## 2013-10-04 DIAGNOSIS — J9589 Other postprocedural complications and disorders of respiratory system, not elsewhere classified: Secondary | ICD-10-CM

## 2013-10-04 DIAGNOSIS — Z794 Long term (current) use of insulin: Secondary | ICD-10-CM | POA: Insufficient documentation

## 2013-10-04 DIAGNOSIS — R7881 Bacteremia: Secondary | ICD-10-CM

## 2013-10-04 LAB — GLUCOSE, CAPILLARY
Glucose-Capillary: 162 mg/dL — ABNORMAL HIGH (ref 70–99)
Glucose-Capillary: 199 mg/dL — ABNORMAL HIGH (ref 70–99)
Glucose-Capillary: 150 mg/dL — ABNORMAL HIGH (ref 70–99)
Glucose-Capillary: 176 mg/dL — ABNORMAL HIGH (ref 70–99)

## 2013-10-04 LAB — BASIC METABOLIC PANEL
BUN: 9 mg/dL (ref 6–23)
CO2: 26 mEq/L (ref 19–32)
Calcium: 8.3 mg/dL — ABNORMAL LOW (ref 8.4–10.5)
Chloride: 105 mEq/L (ref 96–112)
Creatinine, Ser: 0.84 mg/dL (ref 0.50–1.10)
GFR calc Af Amer: >90 mL/min (ref >90)
GFR calc non Af Amer: 79 mL/min — ABNORMAL LOW (ref >90)
Glucose, Bld: 181 mg/dL — ABNORMAL HIGH (ref 70–99)
Potassium: 3.6 mEq/L (ref 3.5–5.1)
Sodium: 142 mEq/L (ref 135–145)

## 2013-10-04 LAB — CBC
HCT: 24.7 % — ABNORMAL LOW (ref 36.0–46.0)
Hemoglobin: 8.2 g/dL — ABNORMAL LOW (ref 12.0–15.0)
MCH: 24 pg — ABNORMAL LOW (ref 26.0–34.0)
MCHC: 33.2 g/dL (ref 30.0–36.0)
MCV: 72.2 fL — ABNORMAL LOW (ref 78.0–100.0)
Platelets: 269 10*3/uL (ref 150–400)
RBC: 3.42 MIL/uL — ABNORMAL LOW (ref 3.87–5.11)
RDW: 17.4 % — ABNORMAL HIGH (ref 11.5–15.5)
WBC: 13.6 10*3/uL — ABNORMAL HIGH (ref 4.0–10.5)

## 2013-10-04 MED ORDER — INSULIN GLARGINE 100 UNIT/ML ~~LOC~~ SOLN
18.0000 [IU] | Freq: Every day | SUBCUTANEOUS | Status: DC
  Administered 2013-10-04: 18 [IU] via SUBCUTANEOUS
  Filled 2013-10-04 (×4): qty 0.18

## 2013-10-04 MED ORDER — CEFPODOXIME PROXETIL 200 MG PO TABS
200.0000 mg | ORAL_TABLET | Freq: Two times a day (BID) | ORAL | Status: DC
  Administered 2013-10-04 – 2013-10-05 (×3): 200 mg via ORAL
  Filled 2013-10-04 (×5): qty 1

## 2013-10-04 NOTE — Progress Notes (Signed)
Physical Therapy Treatment Patient Details Name: ISREAL RUNQUIST MRN: [id number] DOB: 09/02/1961 Today's Date: 10/04/2013 Time: [id number] PT Time Calculation (min): 17 min  PT Assessment / Plan / Recommendation  History of Present Illness Patient is a 52 y/o female admitted with weakness and emesis found to be in DKA vs HONC with shock requiring pressors.  She states that she has diabetes but has not been watching her diet or taking metformin for around 2 years.  Developed respiratory failure requiring intubation on 10/18, diuresed 4L on 10/23 and was extubated 10/24.    PT Comments   Pt progressing towards all goals. Remains to desat into mid 80s during ambulation. Pt was on 3LO2 via [place]. Pt remains safe to d/c home with mother, use of RW and HHPT.   Follow Up Recommendations  Home health PT;Supervision/Assistance - 24 hour     Does the patient have the potential to tolerate intense rehabilitation     Barriers to Discharge        Equipment Recommendations  Rolling walker with 5" wheels    Recommendations for Other Services    Frequency Min 3X/week   Progress towards PT Goals Progress towards PT goals: Progressing toward goals  Plan Current plan remains appropriate    Precautions / Restrictions Precautions Precautions: Fall Precaution Comments: multiple lines, no longer on pressors and off sedation Restrictions Weight Bearing Restrictions: No   Pertinent Vitals/Pain Denies pain    Mobility  Bed Mobility Bed Mobility: Supine to Sit;Sit to Supine Supine to Sit: 5: Supervision Sit to Supine: 5: Supervision Details for Bed Mobility Assistance: able to complete without use of hand rail Transfers Transfers: Sit to Stand;Stand to Sit Sit to Stand: With upper extremity assist;From bed;4: Min guard Stand to Sit: 4: Min guard;With upper extremity assist;To bed Details for Transfer Assistance: increased time, safe technique Ambulation/Gait Ambulation/Gait Assistance: 4: Min  assist Ambulation Distance (Feet): 300 Feet Assistive device: Rolling walker Ambulation/Gait Assistance Details: SpO2 desat to 86% on 3 LO2 via [place]. Pt with noted SOB and consistantly reported "my legs feel like lead. they have so much fluid on them." Pt with onset of fatigue but did not require seated rest break. Pt requested use of RW due to feeling tired and improved sense of stability. Gait Pattern: Step-through pattern;Decreased stride length Gait velocity: slow    Exercises     PT Diagnosis:    PT Problem List:   PT Treatment Interventions:     PT Goals (current goals can now be found in the care plan section) Acute Rehab PT Goals Patient Stated Goal: to go home  Visit Information  Last PT Received On: 10/04/13 Assistance Needed: +1 History of Present Illness: Patient is a 52 y/o female admitted with weakness and emesis found to be in DKA vs HONC with shock requiring pressors.  She states that she has diabetes but has not been watching her diet or taking metformin for around 2 years.  Developed respiratory failure requiring intubation on 10/18, diuresed 4L on 10/23 and was extubated 10/24.     Subjective Data  Patient Stated Goal: to go home   Cognition  Cognition Arousal/Alertness: Awake/alert Behavior During Therapy: WFL for tasks assessed/performed Overall Cognitive Status: Impaired/Different from baseline Area of Impairment: Safety/judgement;Awareness;Problem solving Current Attention Level: Sustained Safety/Judgement: Decreased awareness of safety Awareness: Intellectual Problem Solving: Requires verbal cues General Comments: Pt oriented X 4 during session, no noted deficits with following directions noted but cognition to be evaluated further in context of  function.    Balance  Balance Balance Assessed: Yes Static Standing Balance Static Standing - Balance Support: No upper extremity supported Static Standing - Level of Assistance: 5: Stand by assistance Dynamic  Standing Balance Dynamic Standing - Balance Support: No upper extremity supported Dynamic Standing - Level of Assistance: 4: Min assist High Level Balance High Level Balance Activites: Direction changes High Level Balance Comments: Pt with LOB to both sides when attempting to stand in tandem.  Only able to maintain for 5 seconds before using stepping strategy to correct her balance.    End of Session PT - End of Session Equipment Utilized During Treatment: Gait belt;Oxygen Activity Tolerance: Patient limited by fatigue Patient left: in bed;with call bell/phone within reach   GP     Kingsley Callander 10/04/2013, 3:21 PM  Kittie Plater, PT, DPT Pager #: [phone] Office #: [phone]

## 2013-10-04 NOTE — Progress Notes (Signed)
[facility] Progress Note [place] TEAM 1 - Stepdown/ICU TEAM   Colleen [name] [id number] DOB: 08/27/1961 DOA: 09/21/2013 PCP: No PCP Per Patient  Brief narrative: 52 y/o female with signif hx only for DM who presented to [place] with weakness and emesis found to be in DKA vs HONK with shock requiring pressors. Intubated 10-18.  Admitted to [facility] service initially.  SIGNIFICANT EVENTS / STUDIES:  10/15 - [facility], given IV fluids, IV insulin, and pressors  10/15 - Blood >>> E coli>>pan sens  10/15 Urine >>> E coli>>pan sens 10/16 - transferred to [facility] ICU, pulled out L IJ CVL  10/18 - intubated for resp failure  10/20 - increased support on vent. Renal US: Mild left renal pelviectasis  10/21 - started bicarb drip for NAG acidosis, pressors started and weaned again  10/23 - diuresed 4 L  10/26 - [facility] assumed care of pt   Assessment/Plan:  Septic shock - due to E coli UTI and bacteremia - still with low grade fevers - to complete 14 day abx course - switch to oral Cefpodaxime  Acute respiratory failure with hypoxia due to ARDS / ALI - resolved   Acute systolic heart failure - ?due to septic shock alone - will need repeat echo in outpt f/u once clinically normalized  - better compensated at this time  - follow I and O with diuretics- now 5 L postive instead of 7   DKA / HONK - uncontrolled DM  - DKA/HONK resolved - A1c markedly elevated at 13.4 - pt was reportedly on no prior outpt meds - clearly needs insulin - diabetic teaching ongoing  - titrating medications   Acute renal insufficiency - cr reached peak of 2.23 with GFR of 28 on 10/23 and has since been improving- follow with diuresis  Hypokalemia - much improved w/ repletion - cont to follow   Thrombocytopenia secondary to gram negative sepsis HIT panel negative 0/17/14 - resolved   Anemia - likely of chronic disease based upon Anemia panel - will need outpt f/u once more  stable   Code Status: FULL  Family Communication: Spoke with patient and mother at bedside Disposition Plan: Stable for transfer to a medical bed - begin PT/OT  Antibiotics: Ceftriaxone 10/15, 10-18>>10/20, 10/25 >>  Vancomycin 10/16, 10/20 >> 10/21  Zosyn 10/16>>>10/17, 10/20 >> 10/25  DVT prophylaxis: lovenox  HPI/Subjective: Patient is sitting up eating lunch- has a good appetite now but lunch has chicken which is overcooked so she does not want to eat it. Asking again when the oxygen will be removed. No other complaints  Objective: Blood pressure 131/60, pulse 92, temperature 100.6 F (38.1 C), temperature source Rectal, resp. rate 20, height 5\' 4"  (1.626 m), weight 89.7 kg (197 lb 12 oz), last menstrual period 09/14/2013, SpO2 93.00%.  Intake/Output Summary (Last 24 hours) at 10/04/13 1410 Last data filed at 10/04/13 0200  Gross per 24 hour  Intake    480 ml  Output   2525 ml  Net  -2045 ml   Exam: General: No acute respiratory distress Lungs: diffuse crackles with no wheeze Cardiovascular: Regular rate and rhythm without murmur gallop or rub normal S1 and S2 Abdomen: Nontender, nondistended, soft, bowel sounds positive, no rebound, no ascites, no appreciable mass Extremities: No significant cyanosis, clubbing - trace diffuse edema appreciable  Data Reviewed: Basic Metabolic Panel:  Recent Labs Lab 10/01/13 0355 10/01/13 0430 10/01/13 1500 10/02/13 0500 10/03/13 0420 10/04/13 0535  NA 155*  --  149* 144 143 142  K 2.7*  --  3.3* 3.0* 3.7 3.6  CL 118*  --  112 108 108 105  CO2 30  --  30 27 27 26   GLUCOSE 150*  --  281* 183* 194* 181*  BUN 30*  --  22 18 14 9   CREATININE 0.99  --  1.03 0.89 0.85 0.84  CALCIUM 8.1*  --  8.1* 7.7* 8.0* 8.3*  MG  --  1.9  --  1.8  --   --   PHOS  --   --   --  2.3  --   --    Liver Function Tests: No results found for this basename: AST, ALT, ALKPHOS, BILITOT, PROT, ALBUMIN,  in the last 168 hours  CBC:  Recent Labs Lab  09/30/13 0500 10/01/13 0355 10/02/13 0500 10/03/13 0420 10/04/13 0535  WBC 17.8* 17.4* 17.8* 15.6* 13.6*  HGB 8.2* 7.9* 8.5* 8.6* 8.2*  HCT 24.4* 24.3* 26.0* 27.2* 24.7*  MCV 71.1* 72.1* 72.0* 72.7* 72.2*  PLT 117* 149* 231 262 269   CBG:  Recent Labs Lab 10/03/13 1155 10/03/13 1640 10/03/13 2133 10/04/13 0753 10/04/13 1224  GLUCAP 155* 219* 234* 162* 199*    Recent Results (from the past 240 hour(s))  CULTURE, RESPIRATORY (NON-EXPECTORATED)     Status: None   Collection Time    09/26/13  4:38 PM      Result Value Range Status   Specimen Description TRACHEAL ASPIRATE   Final   Special Requests NONE   Final   Gram Stain     Final   Value: FEW WBC PRESENT, PREDOMINANTLY PMN     NO SQUAMOUS EPITHELIAL CELLS SEEN     NO ORGANISMS SEEN     Performed at [facility]   Culture     Final   Value: Non-Pathogenic Oropharyngeal-type [place] Isolated.     Performed at [facility]   Report Status 09/28/2013 FINAL   Final     Studies:  Recent x-ray studies have been reviewed in detail by the Attending Physician  Scheduled Meds:  Scheduled Meds: . carvedilol  12.5 mg Oral BID WC  . cefpodoxime  200 mg Oral Q12H  . chlorhexidine  15 mL Mouth Rinse BID  . enoxaparin (LOVENOX) injection  40 mg Subcutaneous Q24H  . famotidine  20 mg Oral Daily  . furosemide  40 mg Intravenous Q12H  . insulin aspart  0-15 Units Subcutaneous TID WC  . insulin aspart  0-5 Units Subcutaneous QHS  . insulin aspart  4 Units Subcutaneous TID WC  . insulin glargine  18 Units Subcutaneous QHS   Time spent on care of this patient: 29 min  Debbe Odea, MD  [facility] Office  [phone] Pager - Text Page per Shea Evans as per below:  On-Call/Text Page:      [email or web address]      password [email or web address]  If 7PM-7AM, please contact night-coverage [email or web address] Password [email or web address] 10/04/2013, 2:10 PM   LOS: 13 days

## 2013-10-04 NOTE — Evaluation (Signed)
Occupational Therapy Evaluation Patient Details Name: Colleen [name] MRN: [id number] DOB: 1961/08/30 Today's Date: 10/04/2013 Time: [id number] OT Time Calculation (min): 34 min  OT Assessment / Plan / Recommendation History of present illness Patient is a 52 y/o female admitted with weakness and emesis found to be in DKA vs HONC with shock requiring pressors.  She states that she has diabetes but has not been watching her diet or taking metformin for around 2 years.  Developed respiratory failure requiring intubation on 10/18, diuresed 4L on 10/23 and was extubated 10/24.    Clinical Impression   Pt is currently min guard assist for all selfcare and functional transfers.  Still with limited endurance and slightly decreased overall balance.  Her sats decreased to the lower 80s with activity on 4Ls and stayed at 92% during rest.  She plans to discharge home with her mom when medically ready.  Feel she will benefit from ongoing [place] to continue progressing.      OT Assessment  Patient needs continued OT Services    Follow Up Recommendations  Home health OT       Equipment Recommendations  None recommended by OT (pt will get her own shower seat from family)       Frequency  Min 2X/week    Precautions / Restrictions Precautions Precautions: Fall Precaution Comments: multiple lines, no longer on pressors and off sedation Restrictions Weight Bearing Restrictions: No   Pertinent Vitals/Pain Pt with no report of pain    ADL  Eating/Feeding: Simulated;Independent Where Assessed - Eating/Feeding: Chair Grooming: Simulated;Min guard Where Assessed - Grooming: Unsupported standing Upper Body Bathing: Simulated;Set up Where Assessed - Upper Body Bathing: Unsupported sitting Lower Body Bathing: Simulated;Min guard Where Assessed - Lower Body Bathing: Supported sit to stand Upper Body Dressing: Simulated;Set up Where Assessed - Upper Body Dressing: Unsupported sitting Lower Body  Dressing: Performed;Min guard Where Assessed - Lower Body Dressing: Supported sit to stand Toilet Transfer: Performed;Min guard Armed forces technical officer Method: Other (comment) (ambulate to toilet without assistive device) Toilet Transfer Equipment: Comfort height toilet Toileting - Clothing Manipulation and Hygiene: Simulated;Min guard Where Assessed - Toileting Clothing Manipulation and Hygiene: Sit to stand from 3-in-1 or toilet Equipment Used: Gait belt Transfers/Ambulation Related to ADLs: Pt ambulated without assistive device and min guard assist to the bathroom and around the room during simulated selfcare tasks. ADL Comments: Pt with decreased O2 sats throughout session.  In sitting decreased to 88% on 3Ls nasal cannula.  With activity decreased to 84% during transfers and standing.  Pt requesting rest break after standing for 3 mins during toileting and simulated shower/tub transfer.      OT Diagnosis: Generalized weakness  OT Problem List: Decreased strength;Decreased activity tolerance;Impaired balance (sitting and/or standing);Cardiopulmonary status limiting activity;Decreased knowledge of use of DME or AE OT Treatment Interventions: Self-care/ADL training;Therapeutic activities;Energy conservation;DME and/or AE instruction;Therapeutic exercise;Balance training;Patient/family education;Neuromuscular education   OT Goals(Current goals can be found in the care plan section) Acute Rehab OT Goals Patient Stated Goal: to walk by herself OT Goal Formulation: With patient Time For Goal Achievement: 10/18/13 Potential to Achieve Goals: Good  Visit Information  Last OT Received On: 10/04/13 Assistance Needed: +1 History of Present Illness: Patient is a 52 y/o female admitted with weakness and emesis found to be in DKA vs HONC with shock requiring pressors.  She states that she has diabetes but has not been watching her diet or taking metformin for around 2 years.  Developed respiratory failure  requiring intubation on  10/18, diuresed 4L on 10/23 and was extubated 10/24.        Prior [place] expects to be discharged to:: Private residence Living Arrangements: Parent Available Help at Discharge: Family Type of Home: Apartment Home Layout: Two level Home Equipment: None Prior Function Comments: worked as Librarian, academic at [employer] Communication: Other (comment) (slow, slurred speech with decreased volume,)         Vision/Perception Vision - History Baseline Vision: No visual deficits Patient Visual Report: No change from baseline Vision - Assessment Vision Assessment: Vision not tested Perception Perception: Within Functional Limits Praxis Praxis: Intact   Cognition  Cognition Arousal/Alertness: Awake/alert Overall Cognitive Status: Within Functional Limits for tasks assessed Current Attention Level: Sustained Awareness: Intellectual Problem Solving: Requires verbal cues General Comments: Pt oriented X 4 during session, no noted deficits with following directions noted but cognition to be evaluated further in context of function.    Extremity/Trunk Assessment Upper Extremity Assessment RUE Deficits / Details: Pt with AROM WFLs for all joints, strength 3+/5 throughout. LUE Deficits / Details: AROM WFLS for all joints, strength 3+/5 throughout Lower Extremity Assessment Lower Extremity Assessment: Defer to PT evaluation Cervical / Trunk Assessment Cervical / Trunk Assessment: Normal     Mobility Transfers Transfers: Sit to Stand Sit to Stand: From chair/3-in-1;4: Min guard Stand to Sit: 4: Min guard;To toilet;Without upper extremity assist        Balance Balance Balance Assessed: Yes Static Standing Balance Static Standing - Balance Support: No upper extremity supported Static Standing - Level of Assistance: 5: Stand by assistance Dynamic Standing Balance Dynamic Standing - Balance Support: No  upper extremity supported Dynamic Standing - Level of Assistance: 4: Min assist High Level Balance High Level Balance Activites: Direction changes High Level Balance Comments: Pt with LOB to both sides when attempting to stand in tandem.  Only able to maintain for 5 seconds before using stepping strategy to correct her balance.     End of Session OT - End of Session Equipment Utilized During Treatment: Gait belt Activity Tolerance: Patient limited by fatigue Patient left: in chair;with family/visitor present    Quindarius Cabello OTR/L 10/04/2013, 12:49 PM

## 2013-10-04 NOTE — Care Management Note (Signed)
Page 1 of 2   10/10/2013     9:58:42 AM   CARE MANAGEMENT NOTE 10/10/2013  Patient:  Colleen [name], Colleen [name]   Account Number:  [id number]  Date Initiated:  09/22/2013  Documentation initiated by:  [facility]  Subjective/Objective Assessment:   DKA, sepsis.  On pressors and insuling drip.     Action/Plan:   PT/OT evals   Anticipated DC Date:  10/06/2013   Anticipated DC Plan:  [place]  CM consult      Choice offered to / List presented to:  C-1 Patient   DME arranged  Colleen [name]      DME agency  [place] arranged  HH-1 RN  [place] agency  [place]   Status of service:  Completed, signed off Medicare Important Message given?   (If response is "NO", the following Medicare IM given date fields will be blank) Date Medicare IM given:   Date Additional Medicare IM given:    Discharge Disposition:    Per UR Regulation:  Reviewed for med. necessity/level of care/duration of stay  If discussed at [place] of Stay Meetings, dates discussed:   09/27/2013  09/29/2013  10/04/2013    Comments:  10/10/13 [place], MSN, CM- Spoke with Colleen [name] at [facility] to notify of patient's discharge on 10/09/13.    10/09/13 CM met with pt in room as pt states she has no computer to activate her Xarelto card.  Pt also states she has already called the PCP list previous CM gave her and has an appt with [facility] to secure PCP. Pt no longer needs 02 and [facility] already picked up from room. Pt states she will be staying with her mother after discharge:               Colleen [name]               [address]               [place], [place] [address]  Phone: [phone].  Will leave note to CM concerning discharge on Sunday (as [place] not open to receive call) to notify of discharge. No other CM needs were communicated.  Colleen [name], BSN, CM [phone].  [place]Lason[place], Colleen [name] Mother  [phone]  10/07/13 [place], MSN, CM- Met with patient to provide Xarelto copay assistance card and reinforce importance of finding a PCP prior to discharge.   10/07/13 [place], MSN, CM- Spoke with Colleen [name] to notify that anticipated discharge has now been anticipated in 1-2 days.  CM was asked to have RN contact [place] at [phone] if patient will be discharged on Saturday.  The office will be open from 0800 to 1600.  CM will make RN aware.  10/06/13 [place], MSN, CM- Patient currently unavailable to discuss PCP. CM contacted Colleen [name] with Colleen [name] to notify that patient may be discharged later today, pending test results.    10/05/13 [place], MSN, CM- Met with patient to determine if she had been able to find a PCP. Patient stated that she did not pursue past the automated system.  CM stressed the importance of having a PCP due to chronic health conditions, Home therapies and Home Oxygen. Patient attempted to contact [facility] while CM was  in the room, left her phone number for a call-back.    10/05/13 [place], MSN, CM- Orders were faxed to [facility] as requested.  Colleen [name] with [facility] was notified of orders for home oxygen and a rolling walker.  Colleen [name] DME was informed that patient will be discharging to the [name] address for home O2 equipment delivery.   10/05/13 [place], MSN, CM- Recieved call from [place] at [facility], who has verified that referral has been accepted and they will be able to see the patient tomorrow, if discharged today.  CM notified Colleen [name] that orders will be faxed [phone] as soon as they have been placed by the physician. Patient was updated.  10/05/13 [place], MSN, CM- Met with patient to discuss the need for a PCP for diabetic management and signing home health orders. Patient was given the [facility] number to assist  with finding a PCP.  CM requested that patient call this morning to start the process.  Patient states that she will be returning [facility] with her mother [facility] for post-hospitalization recovery.  Patient's mother's address is: [address] [place], [place] [address]. Patient's contact number is her personal cell [phone].  CM spoke with Colleen [name] at [facility] regarding new referral.  Per Colleen [name], OT is not available in [facility].  CM is awaiting a phone call back regarding availability of PT/RN and ability to accept a referral.  CM spoke with Colleen [name] regarding the lack of OT and whether a HHRN would be appropriate.    10/04/13- 1200- Colleen Webster RN,BSN [phone] Spoke with pt and mom at bedside- confirmed that pt plans to go to [name] house in [place] upon discharge - address- [address]  [place], [place] [address]-- pt is agreeable to [facility] services- not sure she wants RW at discharge- list for [name] agencies in [name] given to pt and her mom to review- will f/u for agency of choice prior to discharge- MD please write for [name] and RW prior to discharge.   10/03/13- 1445- Colleen Gibbons RN, BSN [phone] Off vent/pressors- PT now recommending HH- plan is to stay with her mom in [place] for awhile to recover- will need HH order prior to discharge. NCM to follow to arrange [facility] when ordered.  09-19-13  11:30am Colleen [name] Colleen [name] Remains intubated - off pressors.  Weaned insulin drip off this am.  09-26-13  11:50am [place] Colleen [name] Intubated on 10-18 - ARDS - on pressors.

## 2013-10-04 NOTE — Progress Notes (Signed)
Report called to the RN on 4N. Pt Transferred to 4n room 06. Via wheel chair. accompanied by RN. All belonging went with the patient to the  Room.

## 2013-10-05 ENCOUNTER — Encounter (HOSPITAL_COMMUNITY): Payer: Self-pay | Admitting: Physician Assistant

## 2013-10-05 DIAGNOSIS — R0989 Other specified symptoms and signs involving the circulatory and respiratory systems: Secondary | ICD-10-CM

## 2013-10-05 DIAGNOSIS — A498 Other bacterial infections of unspecified site: Secondary | ICD-10-CM

## 2013-10-05 DIAGNOSIS — A419 Sepsis, unspecified organism: Secondary | ICD-10-CM

## 2013-10-05 DIAGNOSIS — N390 Urinary tract infection, site not specified: Secondary | ICD-10-CM

## 2013-10-05 DIAGNOSIS — I428 Other cardiomyopathies: Secondary | ICD-10-CM

## 2013-10-05 DIAGNOSIS — I420 Dilated cardiomyopathy: Secondary | ICD-10-CM | POA: Diagnosis present

## 2013-10-05 LAB — CBC
HCT: 23.7 % — ABNORMAL LOW (ref 36.0–46.0)
Hemoglobin: 7.9 g/dL — ABNORMAL LOW (ref 12.0–15.0)
MCH: 24 pg — ABNORMAL LOW (ref 26.0–34.0)
MCHC: 33.3 g/dL (ref 30.0–36.0)
MCV: 72 fL — ABNORMAL LOW (ref 78.0–100.0)
Platelets: 287 10*3/uL (ref 150–400)
RBC: 3.29 MIL/uL — ABNORMAL LOW (ref 3.87–5.11)
RDW: 17.6 % — ABNORMAL HIGH (ref 11.5–15.5)
WBC: 9.7 10*3/uL (ref 4.0–10.5)

## 2013-10-05 LAB — GLUCOSE, CAPILLARY
Glucose-Capillary: 142 mg/dL — ABNORMAL HIGH (ref 70–99)
Glucose-Capillary: 239 mg/dL — ABNORMAL HIGH (ref 70–99)
Glucose-Capillary: 193 mg/dL — ABNORMAL HIGH (ref 70–99)
Glucose-Capillary: 211 mg/dL — ABNORMAL HIGH (ref 70–99)

## 2013-10-05 LAB — BASIC METABOLIC PANEL
BUN: 8 mg/dL (ref 6–23)
CO2: 28 mEq/L (ref 19–32)
Calcium: 8.5 mg/dL (ref 8.4–10.5)
Chloride: 105 mEq/L (ref 96–112)
Creatinine, Ser: 0.87 mg/dL (ref 0.50–1.10)
GFR calc Af Amer: 88 mL/min — ABNORMAL LOW (ref >90)
GFR calc non Af Amer: 76 mL/min — ABNORMAL LOW (ref >90)
Glucose, Bld: 161 mg/dL — ABNORMAL HIGH (ref 70–99)
Potassium: 3.3 mEq/L — ABNORMAL LOW (ref 3.5–5.1)
Sodium: 140 mEq/L (ref 135–145)

## 2013-10-05 MED ORDER — POTASSIUM CHLORIDE CRYS ER 20 MEQ PO TBCR
40.0000 meq | EXTENDED_RELEASE_TABLET | Freq: Once | ORAL | Status: AC
  Administered 2013-10-05: 40 meq via ORAL
  Filled 2013-10-05: qty 2

## 2013-10-05 MED ORDER — MENTHOL 3 MG MT LOZG
1.0000 | LOZENGE | OROMUCOSAL | Status: DC | PRN
  Administered 2013-10-06: 3 mg via ORAL
  Filled 2013-10-05 (×2): qty 9

## 2013-10-05 MED ORDER — LISINOPRIL 2.5 MG PO TABS
2.5000 mg | ORAL_TABLET | Freq: Every day | ORAL | Status: DC
  Administered 2013-10-05 – 2013-10-06 (×2): 2.5 mg via ORAL
  Filled 2013-10-05 (×2): qty 1

## 2013-10-05 NOTE — Progress Notes (Signed)
Chaplain visited with PT and brother, Braulio Conte.  She is mentally preparing to go home with her mother.  Pt was alert and sharing some of her arrangements for care in the coming days.  Pt seemed comfortable with next planned steps.    10/05/13 1300  Clinical Encounter Type  Visited With Patient and family together  Visit Type Initial  Referral From Family  Consult/Referral To Chaplain  Spiritual Encounters  Spiritual Needs Emotional  Stress Factors  Patient Stress Factors Major life changes;Health changes  Family Stress Factors None identified   [place], Chaplain

## 2013-10-05 NOTE — Progress Notes (Signed)
[facility] Progress Note    Colleen [name] [id number] DOB: [birth year] DOA: 09/21/2013 PCP: No PCP Per Patient  Brief narrative: 52 y/o female with signif hx only for DM who presented to [place] with weakness and emesis found to be in DKA vs HONK with shock requiring pressors. Intubated 10-18.  Admitted to [facility] service initially.  SIGNIFICANT EVENTS / STUDIES:  10/15 - presented to [facility], given IV fluids, IV insulin, and pressors  10/15 - Blood >>> E coli>>pan sens  10/15 Urine >>> E coli>>pan sens 10/16 - transferred to [facility] ICU, pulled out L IJ CVL  10/18 - intubated for resp failure  10/20 - increased support on vent. Renal US: Mild left renal pelviectasis  10/21 - started bicarb drip for NAG acidosis, pressors started and weaned again  10/23 - diuresed 4 L  10/26 - [facility] assumed care of pt   Assessment/Plan:  Septic shock - due to E coli UTI and bacteremia - still with low grade fevers - has completed 14 days of total antibiotics - Discussed with infectious disease M.D. on call: Recommend: 10-14 days of total antibiotics. DC antibiotics and monitor  Acute respiratory failure with hypoxia due to ARDS / ALI - Patient continues to be hypoxic, especially with activity. - Probably will have to go home on oxygen at least briefly.  Acute systolic heart failure/cardiomyopathy - ?due to septic shock alone - will need repeat echo in outpt f/u once clinically normalized  - better compensated at this time  - Cardiology consulted for recommendations. - Consider starting low-dose ACE inhibitors.  DKA / HONK - uncontrolled DM  - DKA/HONK resolved - A1c markedly elevated at 13.4 - pt was reportedly on no prior outpt meds - clearly needs insulin - diabetic teaching ongoing  - titrating medications   Acute renal insufficiency - cr reached peak of 2.23 with GFR of 28 on 10/23 - Resolved  Hypokalemia - Repleted as needed  Thrombocytopenia secondary to  gram negative sepsis HIT panel negative 0/17/14 - resolved   Anemia - likely of chronic disease based upon Anemia panel - will need outpt f/u once more stable   Code Status: FULL  Family Communication: None Disposition Plan: Possible discharge home pending cardiology consultation,? 10/30  Antibiotics: Ceftriaxone 10/15, 10-18>>10/20, 10/25 >>  Vancomycin 10/16, 10/20 >> 10/21  Zosyn 10/16>>>10/17, 10/20 >> 10/25  DVT prophylaxis: lovenox  HPI/Subjective: Denies complaints. Denies dyspnea.  Objective: Blood pressure 134/83, pulse 100, temperature 98.7 F (37.1 C), temperature source Axillary, resp. rate 18, height 5\' 4"  (1.626 m), weight 80.786 kg (178 lb 1.6 oz), last menstrual period 09/14/2013, SpO2 96.00%.  Intake/Output Summary (Last 24 hours) at 10/05/13 1415 Last data filed at 10/04/13 1858  Gross per 24 hour  Intake    240 ml  Output      0 ml  Net    240 ml   Exam: General: No acute respiratory distress Lungs: Occasional basal crackles but otherwise clear to auscultation and no increased work of breathing. Cardiovascular: Regular rate and rhythm without murmur gallop or rub normal S1 and S2. 1+ bilateral pitting leg edema. Abdomen: Nontender, nondistended, soft, bowel sounds positive, no rebound, no ascites, no appreciable mass Extremities: No significant cyanosis, clubbing. Symmetric 5/5 power. CNS: Alert and oriented. No focal neurological deficit. Old right facial weakness-since childhood.  Data Reviewed: Basic Metabolic Panel:  Recent Labs Lab 10/01/13 0355 10/01/13 0430 10/01/13 1500 10/02/13 0500 10/03/13 [id number] 10/04/13 0535 10/05/13 [id number]  NA 155*  --  149* 144 143 142 140  K 2.7*  --  3.3* 3.0* 3.7 3.6 3.3*  CL 118*  --  112 108 108 105 105  CO2 30  --  30 27 27 26 28   GLUCOSE 150*  --  281* 183* 194* 181* 161*  BUN 30*  --  22 18 14 9 8   CREATININE 0.99  --  1.03 0.89 0.85 0.84 0.87  CALCIUM 8.1*  --  8.1* 7.7* 8.0* 8.3* 8.5  MG  --  1.9  --   1.8  --   --   --   PHOS  --   --   --  2.3  --   --   --    Liver Function Tests: No results found for this basename: AST, ALT, ALKPHOS, BILITOT, PROT, ALBUMIN,  in the last 168 hours  CBC:  Recent Labs Lab 10/01/13 0355 10/02/13 0500 10/03/13 0420 10/04/13 0535 10/05/13 0615  WBC 17.4* 17.8* 15.6* 13.6* 9.7  HGB 7.9* 8.5* 8.6* 8.2* 7.9*  HCT 24.3* 26.0* 27.2* 24.7* 23.7*  MCV 72.1* 72.0* 72.7* 72.2* 72.0*  PLT 149* 231 262 269 287   CBG:  Recent Labs Lab 10/04/13 1224 10/04/13 1716 10/04/13 2135 10/05/13 0636 10/05/13 1123  GLUCAP 199* 150* 176* 142* 239*    Recent Results (from the past 240 hour(s))  CULTURE, RESPIRATORY (NON-EXPECTORATED)     Status: None   Collection Time    09/26/13  4:38 PM      Result Value Range Status   Specimen Description TRACHEAL ASPIRATE   Final   Special Requests NONE   Final   Gram Stain     Final   Value: FEW WBC PRESENT, PREDOMINANTLY PMN     NO SQUAMOUS EPITHELIAL CELLS SEEN     NO ORGANISMS SEEN     Performed at [facility]   Culture     Final   Value: Non-Pathogenic Oropharyngeal-type Flora Isolated.     Performed at [facility]   Report Status 09/28/2013 FINAL   Final     Studies:  Recent x-ray studies have been reviewed in detail by the Attending Physician  Scheduled Meds:  Scheduled Meds: . carvedilol  12.5 mg Oral BID WC  . cefpodoxime  200 mg Oral Q12H  . chlorhexidine  15 mL Mouth Rinse BID  . enoxaparin (LOVENOX) injection  40 mg Subcutaneous Q24H  . famotidine  20 mg Oral Daily  . furosemide  40 mg Intravenous Q12H  . insulin aspart  0-15 Units Subcutaneous TID WC  . insulin aspart  0-5 Units Subcutaneous QHS  . insulin aspart  4 Units Subcutaneous TID WC  . insulin glargine  18 Units Subcutaneous QHS   Time spent on care of this patient: 32 min  Nox Talent, MD, FACP, FHM. [facility] Pager [phone]  If 7PM-7AM, please contact night-coverage [email or web address] Password  [email or web address] 10/05/2013, 2:21 PM   LOS: 14 days

## 2013-10-05 NOTE — Progress Notes (Signed)
10/05/13 0950 Met with patient to discuss the need for a PCP for diabetic management and signing home health orders. Patient was given the [facility] number to assist with finding a PCP.  CM requested that patient call this morning to start the process.

## 2013-10-05 NOTE — Progress Notes (Signed)
Home O2 eval completed. Pt was 88 at rest on RA prior to ambulation, dropped down and maintained around 80 with ambulation, and would not exceed 89 at rest post-ambulation without suuplemental O2.  Pt placed back on 2.5 L via [place], sats now at 96.  MD notified.

## 2013-10-05 NOTE — Consult Note (Signed)
Cardiology Consultation Note  Patient ID: Colleen [name], MRN: [id number], DOB/AGE: May 26, 1961 52 y.o. Admit date: 09/21/2013   Date of Consult: 10/05/2013 Primary Physician: No PCP Per Patient Primary Cardiologist: Angelena Form  Chief Complaint: weakness, emesis Reason for Consult: newly recognized cardiomyopathy EF 30-35%  HPI: Colleen [name] is a 52 y/o F with history of uncontrolled diabetes (not on any meds PTA) who has been admitted at [facility] since 09/21/2013 with weakness for 2 weeks and emesis and was found to have DKA/HONK. She denies prior history besides diabetes for which she was not taking medication. She states this was because she thought she didn't need it since she was exercising regularly 5x/week and eating well. Her hospital course this admission was complicated by septic shock with E. Coli bacteremia from urinary source treated with antibiotics, acute respiratory failure due to ARDS/ALI requiring ventilation (with persistent intermittent hypoxia), altered mental status, acute renal failure requiring bicarb drip, and c-diff negative watery diarrhea. On 09/22/13, 2D echo showed EF 30-35% (RWMA cannot be excluded), moderately reduced systolic function. She has been intermittently diuresed for acute on chronic systolic CHF. She is nearing discharge and we are asked to consult regarding her newly recognized cardiomyopathy. She is currently anemic at 7.9 felt by primary team to represent anemia of chronic disease -microcytic. She denies any melena, BRBPR, hematochezia or hematuria. She has regular, light periods and states she is not sexually active - Uchg neg on admission. A1C was 13.4, thyroid function WNL. She also had acute thrombocytopenia this admission which was felt due to her gram negative sepsis (HIT panel neg) which has recovered. Troponin neg x 1. D-dimer was 17 closer to admission (in the setting of acute illness). She denies any personal/family history of blood clot,  recent travel, bedrest, or immobilization. Kidney function has recovered. Weights have been extremely variable this admission from admit 163 to peak 203 mid-admission now 178 lb. She feels much better but still has LEE.  She denies any CP or SOB either now or prior to admission. Previously exercised 5x/week with weights, calisthenics, treadmill without any limiting symptoms even up until her acute illness. Today she was 88% at rest prior to ambulation then dropped around 80% with ambulation requiring supplemental oxygen.   Past Medical History  Diagnosis Date  . Diabetes   . 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 Wall thickness was normal. Systolic function was moderately to severely reduced. The estimated ejection fraction was in the range of 30% to 35%. Regional wall motion abnormalities cannot be excluded. The study is not technically sufficient to allow evaluation of LV diastolic function. - Right ventricle: Systolic function was moderately reduced. - Atrial septum: No defect or patent foramen ovale was identified.   Surgical History: History reviewed. No pertinent past surgical history.   Home Meds: Prior to Admission medications   Not on File    Inpatient Medications:  . carvedilol  12.5 mg Oral BID WC  . chlorhexidine  15 mL Mouth Rinse BID  . enoxaparin (LOVENOX) injection  40 mg Subcutaneous Q24H  . famotidine  20 mg Oral Daily  . furosemide  40 mg Intravenous Q12H  . insulin aspart  0-15 Units Subcutaneous TID WC  . insulin aspart  0-5 Units Subcutaneous QHS  . insulin aspart  4 Units  Subcutaneous TID WC  . insulin glargine  18 Units Subcutaneous QHS  . lisinopril  2.5 mg Oral Daily  . potassium chloride  40 mEq Oral Once   . sodium chloride 20 mL/hr at 09/30/13 0700    Allergies: No Known Allergies  History    Social History  . Marital Status: Single    Spouse Name: N/A    Number of Children: N/A  . Years of Education: N/A   Occupational History  . Not on file.   Social History Main Topics  . Smoking status: Never Smoker   . Smokeless tobacco: Not on file  . Alcohol Use: No  . Drug Use: No  . Sexual Activity: Not on file   Other Topics Concern  . Not on file   Social History Narrative  . No narrative on file     Family History  Problem Relation Age of Onset  . CAD Neg Hx      Review of Systems: General: positive fever this adm. Variable weight. Cardiovascular: negative orthopnea, palpitations Urologic: negative for hematuria Abdominal: negative for bright red blood per rectum, melena, or hematemesis Neurologic: negative for syncope All other systems reviewed and are otherwise negative except as noted above.  Labs:  Lab Results  Component Value Date   WBC 9.7 10/05/2013   HGB 7.9* 10/05/2013   HCT 23.7* 10/05/2013   MCV 72.0* 10/05/2013   PLT 287 10/05/2013     Recent Labs Lab 10/05/13 0615  NA 140  K 3.3*  CL 105  CO2 28  BUN 8  CREATININE 0.87  CALCIUM 8.5  GLUCOSE 161*    Lab Results  Component Value Date   DDIMER 17.58* 09/23/2013    Radiology/Studies:  US Renal  09/26/2013   CLINICAL DATA:  Evaluate for obstruction.  EXAM: RENAL/URINARY TRACT ULTRASOUND COMPLETE  COMPARISON:  None.  FINDINGS: Right Kidney  Length: Measures 11.7 cm. Normal renal cortical thickness and echogenicity. No hydronephrosis.  Left Kidney  Length: Measures 12.6 cm. Normal renal cortical thickness and echogenicity. Minimal pelviectasis.  Bladder  Grossly unremarkable. Contains a Foley catheter.  Bilateral pleural effusions.  IMPRESSION: Mild left renal pelviectasis  Bilateral pleural effusions.   Electronically Signed   By: Lovey Newcomer M.D.   On: 09/26/2013 16:44   Dg Chest Port 1 View  10/01/2013   CLINICAL DATA:  Respiratory failure, extubated  EXAM: PORTABLE CHEST - 1  VIEW  COMPARISON:  Prior chest x-ray 09/30/2013  FINDINGS: Interval extubation and removal of nasogastric tube. Left subclavian approach central venous catheter remains in good position with the tip at the superior cavoatrial junction. Stable cardiac and mediastinal contours. Persistent diffuse bilateral interstitial and airspace opacities favored to reflect pulmonary edema. Probable small bilateral pleural effusions and bibasilar atelectasis.  IMPRESSION: 1. Interval extubation and removal of nasogastric tube. Stable position of left subclavian catheter. 2. Persistent pulmonary edema and bibasilar atelectasis versus infiltrates.   Electronically Signed   By: Jacqulynn Cadet M.D.   On: 10/01/2013 07:57   Dg Chest Port 1 View  09/30/2013   CLINICAL DATA:  hypoxia  EXAM: PORTABLE CHEST - 1 VIEW  COMPARISON:  None.  FINDINGS: The endotracheal tube tip is 3.6 cm above the carinal. Central catheter tip is at the cavoatrial junction. The nasogastric tube tip and side port are below the diaphragm. No pneumothorax. There is mild cardiac enlargement with interstitial edema. There is patchy airspace consolidation in the left base.  IMPRESSION: The tube and catheter positions are  as described. No pneumothorax. Evidence of a degree of congestive heart failure. Cannot exclude superimposed pneumonia left base. These changes are similar to 1 day prior.   Electronically Signed   By: Lowella Grip M.D.   On: 09/30/2013 07:22   Dg Chest Port 1 View  09/29/2013   CLINICAL DATA:  Check endotracheal tube.  EXAM: PORTABLE CHEST - 1 VIEW  COMPARISON:  09/28/2013  FINDINGS: Endotracheal tube is 3.1 cm above the carina. Nasogastric tube extends into the stomach region. Left subclavian central line in the lower SVC region. Patchy densities are consistent with airspace disease or edema. Probable small effusions. Heart size is grossly stable.  IMPRESSION: Minimal change from the previous examination. Persistent bilateral airspace  densities.  Stable support apparatuses.   Electronically Signed   By: Markus Daft M.D.   On: 09/29/2013 07:53   Dg Chest Port 1 View  09/28/2013   CLINICAL DATA:  Respiratory failure  EXAM: PORTABLE CHEST - 1 VIEW  COMPARISON:  09/27/2013  FINDINGS: Cardiac shadow is stable. Diffuse bilateral airspace disease is again identified. The overall appearance is stable from the prior study. No new focal abnormality is noted. An endotracheal tube, nasogastric catheter and left central venous line are stable.  IMPRESSION: No change from the previous day.   Electronically Signed   By: Inez Catalina M.D.   On: 09/28/2013 07:17   Dg Chest Port 1 View  09/27/2013   CLINICAL DATA:  Intubated, shortness of breath.  EXAM: PORTABLE CHEST - 1 VIEW  COMPARISON:  09/26/2013  FINDINGS: Severe diffuse bilateral airspace disease. Heart is normal size. No visible effusions. Support devices are unchanged.  IMPRESSION: Stable severe bilateral airspace disease. Slight increase in lung volumes.   Electronically Signed   By: Rolm Baptise M.D.   On: 09/27/2013 07:37   Dg Chest Port 1 View  09/26/2013   ADDENDUM REPORT: 09/26/2013 07:51  ADDENDUM: Correction: Comparison exam is 09/25/2013   Electronically Signed   By: Markus Daft M.D.   On: 09/26/2013 07:51   09/26/2013   CLINICAL DATA:  Check endotracheal tube.  EXAM: PORTABLE CHEST - 1 VIEW  COMPARISON:  09/1913  FINDINGS: Endotracheal tube is 4.6 cm above the carina. Nasogastric tube extends into the abdomen. Bilateral airspace disease has minimally changed but may have slightly improved in the right lung. Heart size is grossly unchanged in size. Left subclavian central venous catheter tip is in the lower SVC. No evidence for a pneumothorax.  IMPRESSION: Persistent bilateral airspace disease. Overall, there has been minimal change in the airspace disease but it may be slightly less dense in the right lung.  Stable support apparatuses.  Electronically Signed: By: Markus Daft M.D. On:  09/26/2013 07:44   Dg Chest Port 1 View  09/25/2013   CLINICAL DATA:  Respiratory failure.  EXAM: PORTABLE CHEST - 1 VIEW  COMPARISON:  09/24/2013.  FINDINGS: The support apparatus is stable. The cardiac silhouette, mediastinal and hilar contours are unchanged. There is a persistent diffuse airspace process.  IMPRESSION: Stable support apparatus.  Persistent diffuse airspace process.   Electronically Signed   By: Kalman Jewels M.D.   On: 09/25/2013 08:00   Dg Chest Port 1 View  09/24/2013   CLINICAL DATA:  Central line placement.  EXAM: PORTABLE CHEST - 1 VIEW  COMPARISON:  09/24/2013  FINDINGS: Left subclavian central line is in place with the tip at the cavoatrial junction. No pneumothorax. Diffuse bilateral airspace opacities again noted, unchanged. No visible  effusions. Heart is normal size. Endotracheal tube is 3 cm above the carina.  IMPRESSION: Endotracheal tube 3 cm above the carina. Left subclavian central line tip at the cavoatrial junction. No pneumothorax. Continued diffuse bilateral airspace disease.   Electronically Signed   By: Rolm Baptise M.D.   On: 09/24/2013 21:29   Dg Chest Port 1 View  09/24/2013   CLINICAL DATA:  Low O2 sats.  EXAM: PORTABLE CHEST - 1 VIEW  COMPARISON:  09/23/2013  FINDINGS: Diffuse airspace opacities, worsened since prior study. This could represent edema, hemorrhage or infection. Heart is upper limits normal in size. No visible effusions.  IMPRESSION: Worsening diffuse bilateral airspace disease.   Electronically Signed   By: Rolm Baptise M.D.   On: 09/24/2013 17:27   Dg Chest Port 1 View  09/23/2013   CLINICAL DATA:  Pulmonary edema.  EXAM: PORTABLE CHEST - 1 VIEW  COMPARISON:  09/22/2013  FINDINGS: Left-sided central line has been removed.  Lung apices not entirely included on present exam limiting evaluation for detection of small pneumothorax. No gross pneumothorax detected.  Slightly asymmetric airspace disease suggestive of pulmonary edema although  infectious process not excluded in the proper clinical setting.  Heart size top-normal  IMPRESSION: Slightly asymmetric diffuse airspace disease similar to prior exam. This may represent pulmonary edema although infectious infiltrate could not be excluded in the proper clinical setting.  Left central line removed  Lung apices not entirely included on present exam.   Electronically Signed   By: Chauncey Cruel M.D.   On: 09/23/2013 08:01   Dg Chest Port 1 View  09/22/2013   CLINICAL DATA:  Pulmonary edema, central line placement.  EXAM: PORTABLE CHEST - 1 VIEW  COMPARISON:  September 22, 2013.  FINDINGS: There has been interval placement of left internal jugular catheter line with distal tip in the expected position of the right atrium ; it is recommended to be withdrawn 1-2 cm. Stable mild cardiomegaly. Moderate central pulmonary vascular congestion is again noted and unchanged. Increased alveolar opacity is noted in the right lung base concerning for pneumonia or atelectasis. No pneumothorax is noted.  IMPRESSION: Increased right basilar opacity concerning for pneumonia or subsegmental atelectasis. Stable cardiomegaly and central pulmonary vascular congestion is noted. Interval placement of left internal jugular catheter line with distal tip in the expected position of the right atrium ; it is recommended at be withdrawn by 1-2 cm.   Electronically Signed   By: Sabino Dick M.D.   On: 09/22/2013 07:27   Dg Chest Port 1 View  09/22/2013   CLINICAL DATA:  Emesis and diarrhea for 1 week, with chills and fever.  EXAM: PORTABLE CHEST - 1 VIEW  COMPARISON:  None.  FINDINGS: Cardiomegaly. Low lung volumes. Moderate vascular congestion. No overt failure or areas of lobar consolidation. No effusion or pneumothorax. Negative osseous structures. No visible free air on this portable semi erect radiograph.  IMPRESSION: Cardiomegaly with low lung volumes. No definite active infiltrates or failure.   Electronically Signed    By: Rolla Flatten M.D.   On: 09/22/2013 01:34   EKG: sinus tach 115bpm low voltage QRS TWI I, avL, V4-V6 No prior to compare to  Physical Exam: Blood pressure 122/77, pulse 96, temperature 99 F (37.2 C), temperature source Axillary, resp. rate 18, height 5\' 4"  (1.626 m), weight 178 lb 1.6 oz (80.786 kg), last menstrual period 09/14/2013, SpO2 97.00%. General: Well developed, well nourished AAF in no acute distress. Head: Normocephalic, atraumatic, sclera non-icteric,  no xanthomas, nares are without discharge.  Neck: Faint L carotid bruit. No bruit on the R. JVD minimally elevated. Lungs: Clear bilaterally to auscultation without wheezes, rales, or rhonchi. Breathing is unlabored. Heart: RRR with S1 S2. No murmurs, rubs, or gallops appreciated. Abdomen: Soft, non-tender, non-distended with normoactive bowel sounds. No hepatomegaly. No rebound/guarding. No obvious abdominal masses. Msk:  Strength and tone appear normal for age. Extremities: No clubbing or cyanosis. 1-2+ edema bilaterally.  Distal pedal pulses are 2+ and equal bilaterally. Neuro: Alert and oriented X 3. R facial droop (see PMH).  Psych:  Responds to questions appropriately with a normal affect.   Assessment and Plan:   1. Septic shock from E. Coli bacteremia with urinary source 2. DKA/HONK with uncontrolled DM A1C 13.4 3. Acute respiratory failure, improved but still hypoxic with ambulation 4. Acute renal failure, resolved 5. Acute systolic CHF EF [id number] (unclear chronicity) 6. Acute thrombocytopenia, resolved, HIT negative 7. Microcytic anemia without clear bleeding source, ? of chronic disease 8. L carotid bruit  Pt denies ischemic symptoms prior to admission and was very active. Only cardiac risk factor is uncontrolled diabetes - she is a nonsmoker, no HTN, no family history of CAD. Decreased EF may be due to acute sepsis but still needs ischemic eval. In light of recent sepsis and ongoing anemia, would favor Lexiscan  stress test to further evaluate. This will also allow [name] to reassess her EF. If significantly abnormal, will need cath. Check lipids. Continue beta blocker. Agree with ACEi initiation - follow Cr. Continue IV diuresis for now. Consider addition of spironolactone if EF is still low. If hypoxia does not improve could consider eval for PE given prolonged admission but will defer to primary team (d-dimer was likely elevated previously in this admission due to acute illness). Check carotid doppler given carotid bruit.  Signed, Melina Copa PA-C 10/05/2013, 3:40 PM  I have personally seen and examined this patient with Melina Copa, PA-C. I agree with the assessment and plan as outlined above. New cardiomyopathy, acute systolic CHF. She is still volume overloaded. Continue IV Lasix. Check lipids. Stress myoview in am to exclude ischemia and reassess LVEF. Carotid dopplers. Consider CTA chest to exclude PE with ongoing hypoxia.   MCALHANY,CHRISTOPHER 4:07 PM 10/05/2013

## 2013-10-05 NOTE — Progress Notes (Addendum)
DME equipment has been delivered to pt.

## 2013-10-05 NOTE — Progress Notes (Signed)
Patient will be discharging to  [place], [place] [address].  Contact [phone]

## 2013-10-06 ENCOUNTER — Inpatient Hospital Stay (HOSPITAL_COMMUNITY): Payer: Federal, State, Local not specified - PPO

## 2013-10-06 DIAGNOSIS — IMO0001 Reserved for inherently not codable concepts without codable children: Secondary | ICD-10-CM

## 2013-10-06 DIAGNOSIS — N289 Disorder of kidney and ureter, unspecified: Secondary | ICD-10-CM

## 2013-10-06 DIAGNOSIS — I2699 Other pulmonary embolism without acute cor pulmonale: Secondary | ICD-10-CM

## 2013-10-06 DIAGNOSIS — R0989 Other specified symptoms and signs involving the circulatory and respiratory systems: Secondary | ICD-10-CM

## 2013-10-06 DIAGNOSIS — I10 Essential (primary) hypertension: Secondary | ICD-10-CM

## 2013-10-06 DIAGNOSIS — R079 Chest pain, unspecified: Secondary | ICD-10-CM

## 2013-10-06 LAB — BASIC METABOLIC PANEL
BUN: 8 mg/dL (ref 6–23)
CO2: 27 mEq/L (ref 19–32)
Calcium: 8.4 mg/dL (ref 8.4–10.5)
Chloride: 105 mEq/L (ref 96–112)
Creatinine, Ser: 0.87 mg/dL (ref 0.50–1.10)
GFR calc Af Amer: 88 mL/min — ABNORMAL LOW (ref >90)
GFR calc non Af Amer: 76 mL/min — ABNORMAL LOW (ref >90)
Glucose, Bld: 229 mg/dL — ABNORMAL HIGH (ref 70–99)
Potassium: 3.9 mEq/L (ref 3.5–5.1)
Sodium: 139 mEq/L (ref 135–145)

## 2013-10-06 LAB — LIPID PANEL
Cholesterol: 126 mg/dL (ref 0–200)
HDL: 25 mg/dL — ABNORMAL LOW (ref >39)
LDL Cholesterol: 76 mg/dL (ref 0–99)
Total CHOL/HDL Ratio: 5 RATIO
Triglycerides: 127 mg/dL (ref <150)
VLDL: 25 mg/dL (ref 0–40)

## 2013-10-06 LAB — GLUCOSE, CAPILLARY
Glucose-Capillary: 220 mg/dL — ABNORMAL HIGH (ref 70–99)
Glucose-Capillary: 243 mg/dL — ABNORMAL HIGH (ref 70–99)
Glucose-Capillary: 230 mg/dL — ABNORMAL HIGH (ref 70–99)
Glucose-Capillary: 242 mg/dL — ABNORMAL HIGH (ref 70–99)
Glucose-Capillary: 271 mg/dL — ABNORMAL HIGH (ref 70–99)

## 2013-10-06 MED ORDER — TECHNETIUM TC 99M SESTAMIBI GENERIC - CARDIOLITE
10.0000 | Freq: Once | INTRAVENOUS | Status: AC | PRN
  Administered 2013-10-06: 10 via INTRAVENOUS

## 2013-10-06 MED ORDER — ENOXAPARIN SODIUM 40 MG/0.4ML ~~LOC~~ SOLN
40.0000 mg | SUBCUTANEOUS | Status: AC
  Administered 2013-10-06: 40 mg via SUBCUTANEOUS
  Filled 2013-10-06: qty 0.4

## 2013-10-06 MED ORDER — INSULIN GLARGINE 100 UNIT/ML ~~LOC~~ SOLN
25.0000 [IU] | Freq: Every day | SUBCUTANEOUS | Status: DC
  Administered 2013-10-06: 25 [IU] via SUBCUTANEOUS
  Filled 2013-10-06 (×2): qty 0.25

## 2013-10-06 MED ORDER — REGADENOSON 0.4 MG/5ML IV SOLN
INTRAVENOUS | Status: AC
  Filled 2013-10-06: qty 5

## 2013-10-06 MED ORDER — REGADENOSON 0.4 MG/5ML IV SOLN
0.4000 mg | Freq: Once | INTRAVENOUS | Status: AC
  Administered 2013-10-06: 0.4 mg via INTRAVENOUS
  Filled 2013-10-06: qty 5

## 2013-10-06 MED ORDER — IOHEXOL 350 MG/ML SOLN
100.0000 mL | Freq: Once | INTRAVENOUS | Status: AC | PRN
  Administered 2013-10-06: 100 mL via INTRAVENOUS

## 2013-10-06 MED ORDER — ENOXAPARIN SODIUM 80 MG/0.8ML ~~LOC~~ SOLN
80.0000 mg | Freq: Two times a day (BID) | SUBCUTANEOUS | Status: DC
  Administered 2013-10-06 – 2013-10-07 (×2): 80 mg via SUBCUTANEOUS
  Filled 2013-10-06 (×3): qty 0.8

## 2013-10-06 MED ORDER — INSULIN ASPART 100 UNIT/ML ~~LOC~~ SOLN
6.0000 [IU] | Freq: Three times a day (TID) | SUBCUTANEOUS | Status: DC
  Administered 2013-10-07 – 2013-10-09 (×6): 6 [IU] via SUBCUTANEOUS

## 2013-10-06 MED ORDER — FUROSEMIDE 10 MG/ML IJ SOLN
40.0000 mg | Freq: Three times a day (TID) | INTRAMUSCULAR | Status: DC
  Administered 2013-10-06 – 2013-10-09 (×9): 40 mg via INTRAVENOUS
  Filled 2013-10-06 (×13): qty 4

## 2013-10-06 MED ORDER — TECHNETIUM TC 99M SESTAMIBI GENERIC - CARDIOLITE
30.0000 | Freq: Once | INTRAVENOUS | Status: AC | PRN
  Administered 2013-10-06: 30 via INTRAVENOUS

## 2013-10-06 MED ORDER — LISINOPRIL 5 MG PO TABS
5.0000 mg | ORAL_TABLET | Freq: Every day | ORAL | Status: DC
  Administered 2013-10-07 – 2013-10-09 (×3): 5 mg via ORAL
  Filled 2013-10-06 (×3): qty 1

## 2013-10-06 MED ORDER — CARVEDILOL 25 MG PO TABS
25.0000 mg | ORAL_TABLET | Freq: Two times a day (BID) | ORAL | Status: DC
  Administered 2013-10-06 – 2013-10-09 (×6): 25 mg via ORAL
  Filled 2013-10-06 (×8): qty 1

## 2013-10-06 NOTE — Progress Notes (Signed)
ANTICOAGULATION CONSULT NOTE - Initial Consult  Pharmacy Consult for Lovenox  Indication: bilateral pulmonary embolus  No Known Allergies  Patient Measurements: Height: 5\' 4"  (162.6 cm) Weight: 172 lb 12.8 oz (78.382 kg) IBW/kg (Calculated) : 54.7   Vital Signs: Temp: 97.7 F (36.5 C) (10/30 0538) Temp src: Oral (10/30 0538) BP: 162/68 mmHg (10/30 1017) Pulse Rate: 109 (10/30 1017)  Labs:  Recent Labs  10/04/13 0535 10/05/13 0615 10/06/13 0528  HGB 8.2* 7.9*  --   HCT 24.7* 23.7*  --   PLT 269 287  --   CREATININE 0.84 0.87 0.87    Estimated Creatinine Clearance: 77.5 ml/min (by C-G formula based on Cr of 0.87).   Medical History: Past Medical History  Diagnosis Date  . Diabetes   . 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 carvedilol  25 mg Oral BID WC  . chlorhexidine  15 mL Mouth Rinse BID  . enoxaparin (LOVENOX) injection  40 mg Subcutaneous Q24H  . famotidine  20 mg Oral Daily  . furosemide  40 mg Intravenous Q8H  . insulin aspart  0-15 Units Subcutaneous TID WC  . insulin aspart  0-5 Units Subcutaneous QHS  . insulin aspart  4 Units Subcutaneous TID WC  . insulin glargine  18 Units Subcutaneous QHS  . [START ON 10/07/2013] lisinopril  5 mg Oral Daily  . regadenoson        Assessment: 52 y.o female, CTA revealed bilateral PE. The patient received lovenox 40mg  SQ today @1200 . On VTE prophylaxis lovenox 40mg  SQ q24h since 10/01/13, all doses charted as given.  Weight 78.4 kg,  SCr 0.87, CrCl ~ 77 ml/min.  PLTC 287, Hgb 7.9. No bleeding reported.   Goal of Therapy:  Anti-Xa level 0.6-1.2 units/ml 4hrs after LMWH dose given Monitor platelets by anticoagulation protocol: Yes   Plan:  Lovenox 40mg  SQ x 1 dose now (in addition to the 40 mg dose previously given today for a total of 80 mg SQ then continue Lovenox 80mg  SQ q12h. CBC every  72 hours.  Nicole Cella, RPh Clinical Pharmacist Pager: [phone] 10/06/2013,3:08 PM

## 2013-10-06 NOTE — Progress Notes (Signed)
Seen and agreed 10/06/2013 Jacqualyn Posey PTA [phone] pager [phone] office

## 2013-10-06 NOTE — Progress Notes (Signed)
Physical Therapy Treatment Patient Details Name: Colleen [name] MRN: [id number] DOB: 02/10/1961 Today's Date: 10/06/2013 Time: [id number] PT Time Calculation (min): 17 min  PT Assessment / Plan / Recommendation  History of Present Illness Patient is a 52 y/o female admitted with weakness and emesis found to be in DKA vs HONC with shock requiring pressors.  She states that she has diabetes but has not been watching her diet or taking metformin for around 2 years.  Developed respiratory failure requiring intubation on 10/18, diuresed 4L on 10/23 and was extubated 10/24.    PT Comments   Pt progressing well. Pt planning on d/c'ing to mothers home with stairs.  Pt able to complete stair training with handheld assist for safety.   Follow Up Recommendations  Home health PT;Supervision/Assistance - 24 hour     Does the patient have the potential to tolerate intense rehabilitation     Barriers to Discharge        Equipment Recommendations  Rolling walker with 5" wheels    Recommendations for Other Services Rehab consult;OT consult;Speech consult  Frequency Min 3X/week   Progress towards PT Goals Progress towards PT goals: Progressing toward goals  Plan Current plan remains appropriate    Precautions / Restrictions Precautions Precautions: Fall Precaution Comments: Pt on 2L of O2 Restrictions Weight Bearing Restrictions: No   Pertinent Vitals/Pain Pt denied pain throughout    Mobility  Bed Mobility Bed Mobility: Not assessed Transfers Transfers: Sit to Stand;Stand to Sit Sit to Stand: 6: Modified independent (Device/Increase time) Stand to Sit: 6: Modified independent (Device/Increase time) Ambulation/Gait Ambulation/Gait Assistance: 5: Supervision Ambulation Distance (Feet): 400 Feet Assistive device: Rolling walker Ambulation/Gait Assistance Details: SpO2 dropped to 88%-91% with ambulation.  Pt encouraged to use RW with ambulation at home. Pt safe with RW.  Gait Pattern:  Within Functional Limits Gait velocity: WFL Stairs: Yes Stairs Assistance: 4: Min assist Stairs Assistance Details (indicate cue type and reason): hand held assist on L to ensure balance and safety.  Pt educated on having someone with her at home when using stairs. Number of Stairs: 5 Wheelchair Mobility Wheelchair Mobility: No    Exercises     PT Diagnosis:    PT Problem List:   PT Treatment Interventions:     PT Goals (current goals can now be found in the care plan section)    Visit Information  Last PT Received On: 10/06/13 Assistance Needed: +1 History of Present Illness: Patient is a 52 y/o female admitted with weakness and emesis found to be in DKA vs HONC with shock requiring pressors.  She states that she has diabetes but has not been watching her diet or taking metformin for around 2 years.  Developed respiratory failure requiring intubation on 10/18, diuresed 4L on 10/23 and was extubated 10/24.     Subjective Data      Cognition  Cognition Arousal/Alertness: Awake/alert Behavior During Therapy: WFL for tasks assessed/performed    Balance     End of Session PT - End of Session Equipment Utilized During Treatment: Oxygen Activity Tolerance: Patient tolerated treatment well Patient left: in chair;with call bell/phone within reach Nurse Communication: Mobility status   GP     Colleen [name], [place] 10/06/2013, 11:43 AM

## 2013-10-06 NOTE — Progress Notes (Signed)
VASCULAR LAB PRELIMINARY  PRELIMINARY  PRELIMINARY  PRELIMINARY  Carotid Dopplers completed.    Preliminary report:  There is 1-39% ICA stenosis. Vertebral artery flow is antegrade.  Karolynn Infantino, RVT 10/06/2013, 2:33 PM

## 2013-10-06 NOTE — Progress Notes (Signed)
[facility] Progress Note    Colleen [name] [id number] DOB: 13-Feb-1961 DOA: 09/21/2013 PCP: No PCP Per Patient  Brief narrative: 52 y/o female with signif hx only for DM who presented to [place] with weakness and emesis found to be in DKA vs HONK with shock requiring pressors. Intubated 10-18.  Admitted to [facility] service initially.  SIGNIFICANT EVENTS / STUDIES:  10/15 - [facility], given IV fluids, IV insulin, and pressors  10/15 - Blood >>> E coli>>pan sens  10/15 Urine >>> E coli>>pan sens 10/16 - transferred to [facility] ICU, pulled out L IJ CVL  10/18 - intubated for resp failure  10/20 - increased support on vent. Renal US: Mild left renal pelviectasis  10/21 - started bicarb drip for NAG acidosis, pressors started and weaned again  10/23 - diuresed 4 L  10/26 - [facility] assumed care of pt    Assessment/Plan:  Bilateral PE  - CTA revealed bilateral PE  - Pt to be started on lovenox. Decide regarding oral anticoagulants in AM based on affordability/insurance.  Septic shock - due to E coli UTI and bacteremia - has completed 14 days of total antibiotics - Discussed with infectious disease M.D. on call 10/29: Recommended: 10-14 days of total antibiotics. DCed antibiotics and monitor. - Most recent WBC 9.7 - Afebrile  Acute respiratory failure with hypoxia due to ARDS / ALI - Patient continues to be hypoxic, especially with activity. - Home O2 eval was completed. Pt O2 88% at rest on room air and when ambulating 80% on room air on 10/29 - Probably will have to go home on oxygen at least briefly. - Multifactorial secondary to decompensated CHF, PE and pleural effusions.  Acute systolic heart failure/cardiomyopathy - ?due to septic shock alone - will need repeat echo in outpt f/u once clinically normalized  - Pt started on Lisinopril . - Per cardiology increase lasix to TID  - Follow I and O with increased in frequency of lasix - Myoview: No  evidence of ischemia and EF 48%. - Carotid doppler results pending  - Cardiology increased Coreg to 25 mg and Lisinopril to 5mg  for better BP/HR control   DKA / HONK - uncontrolled DM  - DKA/HONK resolved - A1c markedly elevated at 13.4 - pt was reportedly on no prior outpt meds - clearly needs insulin - diabetic teaching ongoing  - titrating medications   Acute renal insufficiency - Resolved  Hypokalemia - Corrected  Thrombocytopenia secondary to gram negative sepsis HIT panel negative 0/17/14 - resolved   Anemia - likely of chronic disease based upon Anemia panel - will need outpt f/u once more stable   Code Status: FULL  Family Communication: None Disposition Plan: Possible discharge home pending cardiology clearance.   Antibiotics: Ceftriaxone 10/15, 10-18>>10/20, 10/25 >> 10/27 Vancomycin 10/16, 10/20 >> 10/21  Zosyn 10/16>>>10/17, 10/20 >> 10/25  DVT prophylaxis: lovenox  HPI/Subjective: No new complaints. Pt. denies dyspnea and expresses she would like to stop using oxygen. However, it was explained to both patient and family why she will need to be on oxygen due to low O2 staturation. Patient in agreement. Pt. Denies chest pain and palpitations.   Objective: Blood pressure 162/68, pulse 109, temperature 97.7 F (36.5 C), temperature source Oral, resp. rate 16, height 5\' 4"  (1.626 m), weight 78.382 kg (172 lb 12.8 oz), last menstrual period 10/04/2013, SpO2 94.00%.  Intake/Output Summary (Last 24 hours) at 10/06/13 1738 Last data filed at 10/06/13 1619  Gross per 24 hour  Intake    240 ml  Output   3825 ml  Net  -3585 ml   Exam: General:  Pt. Up to chair in No acute respiratory distress Lungs:  basal crackles  bilaterally, but otherwise clear to auscultation and no increased work of breathing. Pt currently on oxygen  Cardiovascular: Regular rate and rhythm without murmur gallop or rub normal S1 and S2. 1+ bilateral pitting leg edema. Abdomen: symmetric, soft,  nontender, non distended. bowel sounds positive. Extremities: No significant cyanosis, clubbing. Symmetric 5/5 power. CNS: Alert and oriented. No focal neurological deficit. Old right facial weakness-since childhood.  Data Reviewed: Basic Metabolic Panel:  Recent Labs Lab 10/01/13 0355 10/01/13 0430 10/01/13 1500 10/02/13 0500 10/03/13 0420 10/04/13 0535 10/05/13 0615 10/06/13 0528  NA 155*  --  149* 144 143 142 140 139  K 2.7*  --  3.3* 3.0* 3.7 3.6 3.3* 3.9  CL 118*  --  112 108 108 105 105 105  CO2 30  --  30 27 27 26 28 27   GLUCOSE 150*  --  281* 183* 194* 181* 161* 229*  BUN 30*  --  22 18 14 9 8 8   CREATININE 0.99  --  1.03 0.89 0.85 0.84 0.87 0.87  CALCIUM 8.1*  --  8.1* 7.7* 8.0* 8.3* 8.5 8.4  MG  --  1.9  --  1.8  --   --   --   --   PHOS  --   --   --  2.3  --   --   --   --    Liver Function Tests: No results found for this basename: AST, ALT, ALKPHOS, BILITOT, PROT, ALBUMIN,  in the last 168 hours  CBC:  Recent Labs Lab 10/01/13 0355 10/02/13 0500 10/03/13 0420 10/04/13 0535 10/05/13 0615  WBC 17.4* 17.8* 15.6* 13.6* 9.7  HGB 7.9* 8.5* 8.6* 8.2* 7.9*  HCT 24.3* 26.0* 27.2* 24.7* 23.7*  MCV 72.1* 72.0* 72.7* 72.2* 72.0*  PLT 149* 231 262 269 287   CBG:  Recent Labs Lab 10/05/13 2133 10/06/13 0343 10/06/13 0704 10/06/13 1133 10/06/13 1608  GLUCAP 211* 220* 243* 230* 242*   Additional labs Lipid panel: cholesterol 126, Triglycerides: 127, HDL:25, LDL cholesterol:76, VLDL: 25, total chol/HDL ratio :5.0  Stress test :No evidence of pharmacological induced myocardial ischemia or focal wall motion abnormality. The calculated left ventricular ejection fraction is 48%. CTA of chest: Bilateral pulmonary emboli. Pulmonary edema and moderate-sized bilateral pleural effusions. Upper lobe airspace opacities could be a combination of asymmetric edema, scarring or infiltrates.    No results found for this or any previous visit (from the past 240 hour(s)).    Studies:  Recent x-ray studies have been reviewed in detail by the Attending Physician  Scheduled Meds:  Scheduled Meds: . carvedilol  25 mg Oral BID WC  . chlorhexidine  15 mL Mouth Rinse BID  . [START ON 10/07/2013] enoxaparin (LOVENOX) injection  80 mg Subcutaneous Q12H  . famotidine  20 mg Oral Daily  . furosemide  40 mg Intravenous Q8H  . insulin aspart  0-15 Units Subcutaneous TID WC  . insulin aspart  0-5 Units Subcutaneous QHS  . insulin aspart  4 Units Subcutaneous TID WC  . insulin glargine  18 Units Subcutaneous QHS  . [START ON 10/07/2013] lisinopril  5 mg Oral Daily  . regadenoson       Time spent on care of this patient: 52 min  Donnice Nielsen, MD, FACP,  FHM. [facility] Pager [phone]  If 7PM-7AM, please contact night-coverage [email or web address] Password [email or web address] 10/06/2013, 5:38 PM   LOS: 15 days

## 2013-10-06 NOTE — Progress Notes (Signed)
     SUBJECTIVE: Feels well. No pain. Mild SOB.   BP 162/68  Pulse 109  Temp(Src) 97.7 F (36.5 C) (Oral)  Resp 16  Ht 5\' 4"  (1.626 m)  Wt 172 lb 12.8 oz (78.382 kg)  BMI 29.65 kg/m2  SpO2 94%  LMP 10/04/2013  Intake/Output Summary (Last 24 hours) at 10/06/13 1134 Last data filed at 10/06/13 0700  Gross per 24 hour  Intake    120 ml  Output   2425 ml  Net  -2305 ml    PHYSICAL EXAM General: Well developed, well nourished, in no acute distress. Alert and oriented x 3.  Psych:  Good affect, responds appropriately Neck: No JVD. No masses noted.  Lungs: Basilar crackles bilaterally with no wheezes or rhonci noted.  Heart: Regular, tachy with no murmurs noted. Abdomen: Bowel sounds are present. Soft, non-tender.  Extremities: 1+bilateral lower extremity edema.   LABS: Basic Metabolic Panel:  Recent Labs  10/05/13 0615 10/06/13 0528  NA 140 139  K 3.3* 3.9  CL 105 105  CO2 28 27  GLUCOSE 161* 229*  BUN 8 8  CREATININE 0.87 0.87  CALCIUM 8.5 8.4   CBC:  Recent Labs  10/04/13 0535 10/05/13 0615  WBC 13.6* 9.7  HGB 8.2* 7.9*  HCT 24.7* 23.7*  MCV 72.2* 72.0*  PLT 269 287   Fasting Lipid Panel:  Recent Labs  10/06/13 0528  CHOL 126  HDL 25*  LDLCALC 76  TRIG 127  CHOLHDL 5.0    Current Meds: . carvedilol  12.5 mg Oral BID WC  . chlorhexidine  15 mL Mouth Rinse BID  . enoxaparin (LOVENOX) injection  40 mg Subcutaneous Q24H  . famotidine  20 mg Oral Daily  . furosemide  40 mg Intravenous Q12H  . insulin aspart  0-15 Units Subcutaneous TID WC  . insulin aspart  0-5 Units Subcutaneous QHS  . insulin aspart  4 Units Subcutaneous TID WC  . insulin glargine  18 Units Subcutaneous QHS  . lisinopril  2.5 mg Oral Daily  . regadenoson        ASSESSMENT AND PLAN:  1. Cardiomyopathy: LVEF 30-35%. Unknown etiology. Recent septic shock from E. Coli bacteremia, urinary source. The reduced LVEF and RV function noted on echo 09/22/13. Stress myoview today  pending to exclude ischemia, reassess LVEF. Continue medical management with Ace inh, beta blocker. (Titrate Coreg and Lisinopril for better BP control/HR control).   2. Acute systolic CHF: Volume status is much better but still appears to have volume overload. Good diuresis yesterday (-2 liters in 24 hours). Would continue IV Lasix today.   3. Septic shock from E. Coli bacteremia with urinary source: resolved.   4. DKA/HONK with uncontrolled DM A1C 13.4   5. Acute respiratory failure: Improved but still hypoxic with ambulation   6. Acute renal failure: resolved   7. Left carotid bruit: Carotid dopplers pending.   8. HTN, uncontrolled: Increase Coreg and Lisinopril.    Colleen [name]  10/30/201411:34 AM

## 2013-10-06 NOTE — Progress Notes (Signed)
Received critical CT of chest report of BIl   PE  Read back result to CT tech MD [facility]  text with report.  Orders place for Pharmacy comsult  STAT  and noted

## 2013-10-07 LAB — BASIC METABOLIC PANEL
BUN: 8 mg/dL (ref 6–23)
CO2: 29 mEq/L (ref 19–32)
Calcium: 8.3 mg/dL — ABNORMAL LOW (ref 8.4–10.5)
Chloride: 106 mEq/L (ref 96–112)
Creatinine, Ser: 0.92 mg/dL (ref 0.50–1.10)
GFR calc Af Amer: 82 mL/min — ABNORMAL LOW (ref >90)
GFR calc non Af Amer: 71 mL/min — ABNORMAL LOW (ref >90)
Glucose, Bld: 237 mg/dL — ABNORMAL HIGH (ref 70–99)
Potassium: 3.5 mEq/L (ref 3.5–5.1)
Sodium: 143 mEq/L (ref 135–145)

## 2013-10-07 LAB — GLUCOSE, CAPILLARY
Glucose-Capillary: 207 mg/dL — ABNORMAL HIGH (ref 70–99)
Glucose-Capillary: 153 mg/dL — ABNORMAL HIGH (ref 70–99)
Glucose-Capillary: 186 mg/dL — ABNORMAL HIGH (ref 70–99)
Glucose-Capillary: 153 mg/dL — ABNORMAL HIGH (ref 70–99)

## 2013-10-07 MED ORDER — LIVING WELL WITH DIABETES BOOK
Freq: Once | Status: AC
  Administered 2013-10-07: 14:00:00
  Filled 2013-10-07: qty 1

## 2013-10-07 MED ORDER — INSULIN GLARGINE 100 UNIT/ML ~~LOC~~ SOLN
30.0000 [IU] | Freq: Every day | SUBCUTANEOUS | Status: DC
  Administered 2013-10-07 – 2013-10-08 (×2): 30 [IU] via SUBCUTANEOUS
  Filled 2013-10-07 (×3): qty 0.3

## 2013-10-07 MED ORDER — RIVAROXABAN 20 MG PO TABS: 20.0000 mg | ORAL_TABLET | Freq: Every day | ORAL | Status: DC

## 2013-10-07 MED ORDER — RIVAROXABAN 15 MG PO TABS
15.0000 mg | ORAL_TABLET | Freq: Two times a day (BID) | ORAL | Status: DC
  Administered 2013-10-07 – 2013-10-09 (×4): 15 mg via ORAL
  Filled 2013-10-07 (×10): qty 1

## 2013-10-07 NOTE — Progress Notes (Addendum)
Inpatient Diabetes Program Recommendations  AACE/ADA: New Consensus Statement on Inpatient Glycemic Control (2013)  Target Ranges:  Prepandial:   less than 140 mg/dL      Peak postprandial:   less than 180 mg/dL (1-2 hours)      Critically ill patients:  140 - 180 mg/dL   Elevated fasting glucose levels.  Inpatient Diabetes Program Recommendations Insulin - Basal: Please increase lantus again to 30 units.  Fasting cbg's are still high!  Thank you, Rosita Kea, RN, CNS, Diabetes Coordinator [phone])

## 2013-10-07 NOTE — Progress Notes (Signed)
[facility] Progress Note    Colleen [name] [id number] DOB: 06-08-1961 DOA: 09/21/2013 PCP: No PCP Per Patient  Brief narrative: 52 y/o female with signif hx only for DM who presented to [place] with weakness and emesis found to be in DKA vs HONK with shock requiring pressors. Intubated 10-18.  Admitted to [facility] service initially.  SIGNIFICANT EVENTS / STUDIES:  10/15 - [facility], given IV fluids, IV insulin, and pressors  10/15 - Blood >>> E coli>>pan sens  10/15 Urine >>> E coli>>pan sens 10/16 - transferred to [facility] ICU, pulled out L IJ CVL  10/18 - intubated for resp failure  10/20 - increased support on vent. Renal US: Mild left renal pelviectasis  10/21 - started bicarb drip for NAG acidosis, pressors started and weaned again  10/23 - diuresed 4 L  10/26 - [facility] assumed care of pt    Assessment/Plan:  Bilateral PE  - CTA revealed bilateral PE  - Pt was started on Lovenox on 10/06/2013 - Start Xarelto on 10/31: will need AC for at least 6 months.  Septic shock - due to E coli UTI and bacteremia - has completed 14 days of total antibiotics - Discussed with infectious disease M.D. on call 10/29: Recommended: 10-14 days of total antibiotics. DCed antibiotics and monitor. - Most recent WBC 9.7 - Afebrile  Acute respiratory failure with hypoxia due to ARDS / ALI - Patient continues to be hypoxic, especially with activity. - Home O2 eval was completed. Pt O2 88% at rest on room air and when ambulating 80% on room air on 10/29 - Probably will have to go home on oxygen at least briefly. - Multifactorial secondary to decompensated CHF, PE and pleural effusions. - Reevaluate 02 needs prior to DC  Acute systolic heart failure/cardiomyopathy - ?due to septic shock alone - will need repeat echo in outpt f/u once clinically normalized  - Pt started on Lisinopril . - Per cardiology increased lasix to TID  - Follow I and O with increased in  frequency of lasix - Myoview: No evidence of ischemia and EF 48%. - Cardiology increased Coreg to 25 mg and Lisinopril to 5mg  for better BP/HR control  - Continues to diurese. May need additional one to 2 days of IV Lasix prior to DC.  DKA / HONK - uncontrolled DM  - DKA/HONK resolved - A1c markedly elevated at 13.4 - pt was reportedly on no prior outpt meds - clearly needs insulin - diabetic teaching ongoing  - titrating medications   Acute renal insufficiency - Resolved  Hypokalemia - Corrected  Thrombocytopenia secondary to gram negative sepsis HIT panel negative 0/17/14 - resolved   Anemia - likely of chronic disease based upon Anemia panel - will need outpt f/u once more stable   Code Status: FULL  Family Communication: Discussed with patient's boyfriend. Also had discussed with patient's brother and sister at bedside on 10/06/13 Disposition Plan: Possible discharge home in the next one-2 days  Antibiotics: Ceftriaxone 10/15, 10-18>>10/20, 10/25 >> 10/27 Vancomycin 10/16, 10/20 >> 10/21  Zosyn 10/16>>>10/17, 10/20 >> 10/25  DVT prophylaxis: lovenox  HPI/Subjective: Denies dyspnea. Still has some leg swelling. Hand swelling has decreased.  Objective: Blood pressure 130/60, pulse 94, temperature 98.4 F (36.9 C), temperature source Axillary, resp. rate 19, height 5\' 4"  (1.626 m), weight 76 kg (167 lb 8.8 oz), last menstrual period 10/04/2013, SpO2 97.00%.  Intake/Output Summary (Last 24 hours) at 10/07/13 1555 Last data filed at  10/07/13 1433  Gross per 24 hour  Intake    720 ml  Output   3500 ml  Net  -2780 ml   Exam: General:  Patient sitting up in bed comfortably. Lungs:  basal crackles  bilaterally, but otherwise clear to auscultation and no increased work of breathing. Pt currently on oxygen  Cardiovascular: Regular rate and rhythm without murmur gallop or rub normal S1 and S2. 1+ bilateral pitting leg edema. Abdomen: symmetric, soft, nontender, non  distended. bowel sounds positive. Extremities: No significant cyanosis, clubbing. Symmetric 5/5 power. CNS: Alert and oriented. No focal neurological deficit. Old right facial weakness-since childhood.  Data Reviewed: Basic Metabolic Panel:  Recent Labs Lab 10/01/13 0355 10/01/13 0430 10/01/13 1500 10/02/13 0500 10/03/13 0420 10/04/13 0535 10/05/13 0615 10/06/13 0528 10/07/13 0439  NA 155*  --  149* 144 143 142 140 139 143  K 2.7*  --  3.3* 3.0* 3.7 3.6 3.3* 3.9 3.5  CL 118*  --  112 108 108 105 105 105 106  CO2 30  --  30 27 27 26 28 27 29   GLUCOSE 150*  --  281* 183* 194* 181* 161* 229* 237*  BUN 30*  --  22 18 14 9 8 8 8   CREATININE 0.99  --  1.03 0.89 0.85 0.84 0.87 0.87 0.92  CALCIUM 8.1*  --  8.1* 7.7* 8.0* 8.3* 8.5 8.4 8.3*  MG  --  1.9  --  1.8  --   --   --   --   --   PHOS  --   --   --  2.3  --   --   --   --   --    Liver Function Tests: No results found for this basename: AST, ALT, ALKPHOS, BILITOT, PROT, ALBUMIN,  in the last 168 hours  CBC:  Recent Labs Lab 10/01/13 0355 10/02/13 0500 10/03/13 0420 10/04/13 0535 10/05/13 0615  WBC 17.4* 17.8* 15.6* 13.6* 9.7  HGB 7.9* 8.5* 8.6* 8.2* 7.9*  HCT 24.3* 26.0* 27.2* 24.7* 23.7*  MCV 72.1* 72.0* 72.7* 72.2* 72.0*  PLT 149* 231 262 269 287   CBG:  Recent Labs Lab 10/06/13 1133 10/06/13 1608 10/06/13 2140 10/07/13 0647 10/07/13 1127  GLUCAP 230* 242* 271* 207* 153*   Additional labs Lipid panel: cholesterol 126, Triglycerides: 127, HDL:25, LDL cholesterol:76, VLDL: 25, total chol/HDL ratio :5.0  Stress test :No evidence of pharmacological induced myocardial ischemia or focal wall motion abnormality. The calculated left ventricular ejection fraction is 48%. CTA of chest: Bilateral pulmonary emboli. Pulmonary edema and moderate-sized bilateral pleural effusions. Upper lobe airspace opacities could be a combination of asymmetric edema, scarring or infiltrates.    No results found for this or any  previous visit (from the past 240 hour(s)).   Studies:  Recent x-ray studies have been reviewed in detail by the Attending Physician  Scheduled Meds:  Scheduled Meds: . carvedilol  25 mg Oral BID WC  . chlorhexidine  15 mL Mouth Rinse BID  . enoxaparin (LOVENOX) injection  80 mg Subcutaneous Q12H  . famotidine  20 mg Oral Daily  . furosemide  40 mg Intravenous Q8H  . insulin aspart  0-15 Units Subcutaneous TID WC  . insulin aspart  0-5 Units Subcutaneous QHS  . insulin aspart  6 Units Subcutaneous TID WC  . insulin glargine  30 Units Subcutaneous QHS  . lisinopril  5 mg Oral Daily  . living well with diabetes book   Does not apply  Once   Time spent on care of this patient: 43 min  Shima Compere, MD, FACP, FHM. [facility] Pager [phone]  If 7PM-7AM, please contact night-coverage [email or web address] Password [email or web address] 10/07/2013, 3:55 PM   LOS: 16 days

## 2013-10-07 NOTE — Progress Notes (Signed)
Occupational Therapy Treatment and discharge Patient Details Name: Colleen [name] MRN: [id number] DOB: Aug 07, 1961 Today's Date: 10/07/2013 Time: [id number] OT Time Calculation (min): 23 min  OT Assessment / Plan / Recommendation  History of present illness Patient is a 52 y/o female admitted with weakness and emesis found to be in DKA vs HONC with shock requiring pressors.  She states that she has diabetes but has not been watching her diet or taking metformin for around 2 years.  Developed respiratory failure requiring intubation on 10/18, diuresed 4L on 10/23 and was extubated 10/24. Now with B PEs.    OT comments  Pt is performing bathing, dressing, and toileting at a modified independent level.  She is able to verbalize home safety with regard to showering/tub transfers and energy conservation strategies.  UE function is [facility] for ADL.  No further OT needs.  Pt with plans to stay with her mother upon d/c.  Follow Up Recommendations  No OT follow up    Barriers to Discharge       Equipment Recommendations  None recommended by OT    Recommendations for Other Services    Frequency Min 2X/week   Progress towards OT Goals Progress towards OT goals: Goals met and updated - see care plan  Plan Discharge plan needs to be updated    Precautions / Restrictions Precautions Precaution Comments: Pt on 2L of O2   Pertinent Vitals/Pain On 2L 02, VSS, no pain     ADL  Lower Body Dressing: Independent Where Assessed - Lower Body Dressing: Unsupported sitting;Supported sit to stand Toilet Transfer: Modified independent Toilet Transfer Method: Sit to Loss adjuster, chartered: Comfort height toilet;Grab bars Toileting - Clothing Manipulation and Hygiene: Independent Where Assessed - Best boy and Hygiene: Sit on 3-in-1 or toilet Tub/Shower Transfer: Supervision/safety Tub/Shower Transfer Method: Ambulating Transfers/Ambulation Related to ADLs: mod I within room  and bathroom ADL Comments: Educated pt in pacing and energy conservation.  Recommended pt get a tub seat for energy conservation and safety.  Verbalized understanding.    OT Diagnosis:    OT Problem List:   OT Treatment Interventions:     OT Goals(current goals can now be found in the care plan section) Acute Rehab OT Goals Patient Stated Goal: to go home  Visit Information  Last OT Received On: 10/07/13 Assistance Needed: +1 History of Present Illness: Patient is a 52 y/o female admitted with weakness and emesis found to be in DKA vs HONC with shock requiring pressors.  She states that she has diabetes but has not been watching her diet or taking metformin for around 2 years.  Developed respiratory failure requiring intubation on 10/18, diuresed 4L on 10/23 and was extubated 10/24. Now with B PEs.     Subjective Data      Prior Functioning       Cognition  Cognition Arousal/Alertness: Awake/alert Behavior During Therapy: WFL for tasks assessed/performed Overall Cognitive Status: Within Functional Limits for tasks assessed    Mobility  Bed Mobility Bed Mobility: Not assessed Transfers Transfers: Sit to Stand;Stand to Sit Sit to Stand: 6: Modified independent (Device/Increase time) Stand to Sit: 6: Modified independent (Device/Increase time) Details for Transfer Assistance: increased time, safe technique    Exercises      Balance Static Standing Balance Static Standing - Balance Support: No upper extremity supported Static Standing - Level of Assistance: 7: Independent   End of Session OT - End of Session Activity Tolerance: Patient limited by fatigue Patient  left: in bed;with call bell/phone within reach;with family/visitor present  GO     Malka So 10/07/2013, 3:11 PM [phone]

## 2013-10-07 NOTE — Progress Notes (Signed)
Benefits check results   novolog: flexpen- for 18 pens for 30 days; $80 at mail order/ $102.35 for 30 day at retail plus cost of needles vial- $80 for mail order/ $76.82 at retail for 30 day plus cost of needles  Xarelto: $80 at mail order/ $426.23 at retail for 30 day  Eliquis: $105 at mail order/ $133.69 at retail for 30 day  no auth required for any medication   Copay assistance cards are available for both Xarelto and Eliquis.

## 2013-10-07 NOTE — Progress Notes (Signed)
ANTICOAGULATION CONSULT NOTE - Initial Consult  Pharmacy Consult:  Lovenox >> Xarelto Indication:  PE  No Known Allergies  Patient Measurements: Height: 5\' 4"  (162.6 cm) Weight: 167 lb 8.8 oz (76 kg) IBW/kg (Calculated) : 54.7  Vital Signs: Temp: 98.4 F (36.9 C) (10/31 1256) Temp src: Axillary (10/31 1256) BP: 130/60 mmHg (10/31 1256) Pulse Rate: 94 (10/31 1256)  Labs:  Recent Labs  10/05/13 0615 10/06/13 0528 10/07/13 0439  HGB 7.9*  --   --   HCT 23.7*  --   --   PLT 287  --   --   CREATININE 0.87 0.87 0.92    Estimated Creatinine Clearance: 72.2 ml/min (by C-G formula based on Cr of 0.92).   Medical History: Past Medical History  Diagnosis Date  . Diabetes   . 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  Noted she received Lovenox 80mg  SQ today around 1230.  Patient's renal function and platelets are WNL.  Aware her hemoglobin is low and stable.  No LFTs.   Goal of Therapy:  Full anticoagulation Monitor platelets by anticoagulation protocol: Yes    Plan:  - D/C Lovenox - Xarelto 15mg  PO BIDWM x 21 days, then 20mg  PO daily with supper.  Will start tonight with instruction to give med with a snack. - Monitor renal fxn, CBC, s/sx of bleeding    Socorro Ebron D. Mina Marble, PharmD, BCPS Pager:  [phone] 10/07/2013, 4:17 PM

## 2013-10-07 NOTE — Progress Notes (Signed)
SUBJECTIVE: Still dyspneic. No pain.   BP 137/82  Pulse 97  Temp(Src) 98.7 F (37.1 C) (Axillary)  Resp 18  Ht 5\' 4"  (1.626 m)  Wt 167 lb 8.8 oz (76 kg)  BMI 28.75 kg/m2  SpO2 96%  LMP 10/04/2013  Intake/Output Summary (Last 24 hours) at 10/07/13 [id number] Last data filed at 10/07/13 0214  Gross per 24 hour  Intake    480 ml  Output   3000 ml  Net  -2520 ml    PHYSICAL EXAM General: Well developed, well nourished, in no acute distress. Alert and oriented x 3.  Psych:  Good affect, responds appropriately Neck: No JVD. No masses noted.  Lungs: Crackles bases bilaterally with no wheezes or rhonci noted.  Heart: RRR with no murmurs noted. Abdomen: Bowel sounds are present. Soft, non-tender.  Extremities: 1+ pitting bilateral lower extremity edema.   LABS: Basic Metabolic Panel:  Recent Labs  10/06/13 0528 10/07/13 0439  NA 139 143  K 3.9 3.5  CL 105 106  CO2 27 29  GLUCOSE 229* 237*  BUN 8 8  CREATININE 0.87 0.92  CALCIUM 8.4 8.3*   CBC:  Recent Labs  10/05/13 0615  WBC 9.7  HGB 7.9*  HCT 23.7*  MCV 72.0*  PLT 287   Fasting Lipid Panel:  Recent Labs  10/06/13 0528  CHOL 126  HDL 25*  LDLCALC 76  TRIG 127  CHOLHDL 5.0    Current Meds: . carvedilol  25 mg Oral BID WC  . chlorhexidine  15 mL Mouth Rinse BID  . enoxaparin (LOVENOX) injection  80 mg Subcutaneous Q12H  . famotidine  20 mg Oral Daily  . furosemide  40 mg Intravenous Q8H  . insulin aspart  0-15 Units Subcutaneous TID WC  . insulin aspart  0-5 Units Subcutaneous QHS  . insulin aspart  6 Units Subcutaneous TID WC  . insulin glargine  25 Units Subcutaneous QHS  . lisinopril  5 mg Oral Daily   Stress myoview 10/06/13:  SPECT images demonstrate normal left ventricular activity. There are  no fixed or reversible perfusion defects.  Gated images were reviewed and demonstrate normal left ventricular  wall motion and systolic thickening. The QGS ejection fraction  calculated at rest  is 48% with an end-diastolic volume of [id number] and  an end-systolic volume of 123456.  IMPRESSION:  No evidence of pharmacological induced myocardial ischemia or focal  wall motion abnormality. The calculated left ventricular ejection  fraction is 48%.  CTA chest 10/06/13: 1. Bilateral pulmonary emboli.  2. Pulmonary edema and moderate-sized bilateral pleural effusions.  3. Upper lobe airspace opacities could be a combination of  asymmetric edema, scarring or infiltrates. CT followup suggested in  3 months.  4. Normal thoracic aorta.  These results will be called to the ordering clinician or  representative by the Radiologist Assistant, and communication  documented in the PACS Dashboard.   ASSESSMENT AND PLAN:  1. Cardiomyopathy: LVEF 30-35% by echo 09/22/13 but 48% by myoview 10/06/13. Unknown etiology. No ischemia on stress test. May be related to PE/recent sepsis/E. Coli bacteremia, urinary source. Continue medical management with Ace inh, beta blocker. (Titrate Coreg and Lisinopril for better BP control/HR control).   2. Acute systolic CHF: Volume status is much better but still appears to have volume overload. CT chest with persistent pleural effusions and pulmonary edema. Pitting edema LE bilaterally. Good diuresis yesterday (-2.5 liters in 24 hours). Would continue IV Lasix today.   3. Septic  shock from E. Coli bacteremia with urinary source: resolved.   4. DKA/HONK with uncontrolled DM A1C 13.4   5. Acute respiratory failure: Improved but still hypoxic with ambulation. Likely combination of acute lung injury and bilateral PE  6. Acute renal failure: resolved   7. Left carotid bruit: Mild bilateral carotid artery disease.   8. Bilateral pulmonary emboli: On Lovenox. Anti-coagulation per primary team.   Would continue to diurese today and plan d/c later this weekend.   Colleen [name]  10/31/20147:23 AM

## 2013-10-08 DIAGNOSIS — I5021 Acute systolic (congestive) heart failure: Secondary | ICD-10-CM

## 2013-10-08 LAB — GLUCOSE, CAPILLARY
Glucose-Capillary: 105 mg/dL — ABNORMAL HIGH (ref 70–99)
Glucose-Capillary: 102 mg/dL — ABNORMAL HIGH (ref 70–99)
Glucose-Capillary: 133 mg/dL — ABNORMAL HIGH (ref 70–99)
Glucose-Capillary: 115 mg/dL — ABNORMAL HIGH (ref 70–99)

## 2013-10-08 LAB — BASIC METABOLIC PANEL
BUN: 8 mg/dL (ref 6–23)
CO2: 27 mEq/L (ref 19–32)
Calcium: 8.5 mg/dL (ref 8.4–10.5)
Chloride: 104 mEq/L (ref 96–112)
Creatinine, Ser: 0.9 mg/dL (ref 0.50–1.10)
GFR calc Af Amer: 84 mL/min — ABNORMAL LOW (ref >90)
GFR calc non Af Amer: 73 mL/min — ABNORMAL LOW (ref >90)
Glucose, Bld: 98 mg/dL (ref 70–99)
Potassium: 3.5 mEq/L (ref 3.5–5.1)
Sodium: 142 mEq/L (ref 135–145)

## 2013-10-08 MED ORDER — ACETAMINOPHEN 325 MG PO TABS
650.0000 mg | ORAL_TABLET | Freq: Four times a day (QID) | ORAL | Status: DC | PRN
  Administered 2013-10-08 – 2013-10-09 (×2): 650 mg via ORAL
  Filled 2013-10-08: qty 2

## 2013-10-08 NOTE — Progress Notes (Addendum)
Subjective: No CP  Breathing is OK at rest   Objective: Filed Vitals:   10/07/13 2100 10/08/13 0500 10/08/13 0523 10/08/13 0930  BP: 147/81  141/70 118/60  Pulse: 94  95 90  Temp: 98.7 F (37.1 C)  98.3 F (36.8 C) 98 F (36.7 C)  TempSrc: Axillary  Oral Oral  Resp: 20  18 18   Height:      Weight:  74 lb 6.4 oz (33.748 kg)    SpO2: 96%  96% 96%   Weight change: -93 lb 2.4 oz (-42.252 kg)  Intake/Output Summary (Last 24 hours) at 10/08/13 0959 Last data filed at 10/08/13 0556  Gross per 24 hour  Intake    610 ml  Output   2750 ml  Net  -2140 ml   Net 2.45 Negative  General: Alert, awake, oriented x3, in no acute distress Neck:  JVP is increased below jaw.   Heart: Regular rate and rhythm, without murmurs, rubs, gallops.  Lungs: Clear to auscultation.  Decreased BS at R base Exemities:  1+ edema.   Neuro: Grossly intact, nonfocal.   Lab Results: Results for orders placed during the hospital encounter of 09/21/13 (from the past 24 hour(s))  GLUCOSE, CAPILLARY     Status: Abnormal   Collection Time    10/07/13 11:27 AM      Result Value Range   Glucose-Capillary 153 (*) 70 - 99 mg/dL  GLUCOSE, CAPILLARY     Status: Abnormal   Collection Time    10/07/13  4:48 PM      Result Value Range   Glucose-Capillary 186 (*) 70 - 99 mg/dL  GLUCOSE, CAPILLARY     Status: Abnormal   Collection Time    10/07/13  9:02 PM      Result Value Range   Glucose-Capillary 153 (*) 70 - 99 mg/dL   Comment 1 Notify RN    GLUCOSE, CAPILLARY     Status: Abnormal   Collection Time    10/08/13  6:01 AM      Result Value Range   Glucose-Capillary 105 (*) 70 - 99 mg/dL   Comment 1 Notify RN    BASIC METABOLIC PANEL     Status: Abnormal   Collection Time    10/08/13  6:10 AM      Result Value Range   Sodium 142  135 - 145 mEq/L   Potassium 3.5  3.5 - 5.1 mEq/L   Chloride 104  96 - 112 mEq/L   CO2 27  19 - 32 mEq/L   Glucose, Bld 98  70 - 99 mg/dL   BUN 8  6 - 23 mg/dL   Creatinine,  Ser 0.90  0.50 - 1.10 mg/dL   Calcium 8.5  8.4 - 10.5 mg/dL   GFR calc non Af Amer 73 (*) >90 mL/min   GFR calc Af Amer 84 (*) >90 mL/min    Studies/Results: @RISRSLT24 @  Medications: Reviewed   @PROBHOSP @  1.  Acute systolic CHF  LVEF 30 to [id number] by echo RVEF is moderately reduced   Still with increased volume on exam.  I would give 1 more day of IV lasix then switch to PO  Replete K    Follow as outpatient    2.  PE  Bilateral   On anticoag  3.  Recent sepsis   Resp;ved    4.  Acute resp failure  Improving.    5.  ARF  Cr OK   6  CV disease  Mild   LOS: 17 days   Dorris Carnes 10/08/2013, 9:59 AM

## 2013-10-08 NOTE — Progress Notes (Signed)
[facility] Progress Note    Colleen [name] [id number] DOB: 1961-09-22 DOA: 09/21/2013 PCP: No PCP Per Patient  Brief narrative: 52 y/o female with signif hx only for DM who presented to [place] with weakness and emesis found to be in DKA vs HONK with shock requiring pressors. Intubated 10-18.  Admitted to [facility] service initially.  SIGNIFICANT EVENTS / STUDIES:  10/15 - [facility], given IV fluids, IV insulin, and pressors  10/15 - Blood >>> E coli>>pan sens  10/15 Urine >>> E coli>>pan sens 10/16 - transferred to [facility] ICU, pulled out L IJ CVL  10/18 - intubated for resp failure  10/20 - increased support on vent. Renal US: Mild left renal pelviectasis  10/21 - started bicarb drip for NAG acidosis, pressors started and weaned again  10/23 - diuresed 4 L  10/26 - [facility] assumed care of pt    Assessment/Plan:  Bilateral PE  - CTA revealed bilateral PE  - Pt was started on Lovenox on 10/06/2013 - Start Xarelto on 10/31: will need AC for at least 6 months.  Septic shock - due to E coli UTI and bacteremia - has completed 14 days of total antibiotics - Discussed with infectious disease M.D. on call 10/29: Recommended: 10-14 days of total antibiotics. DCed antibiotics and monitor. - resolved  Acute respiratory failure with hypoxia due to ARDS / ALI - Patient continues to be hypoxic, especially with activity. - Home O2 eval was completed. Pt O2 88% at rest on room air and when ambulating 80% on room air on 10/29 - Probably will have to go home on oxygen at least briefly. - Multifactorial secondary to decompensated CHF, PE and pleural effusions. - Hypoxia resolved  Acute systolic heart failure/cardiomyopathy - ?due to septic shock alone - will need repeat echo in outpt f/u once clinically normalized  - Pt started on Lisinopril . - Per cardiology increased lasix to TID  - Follow I and O with increased in frequency of lasix - Myoview: No evidence  of ischemia and EF 48%. - Cardiology increased Coreg to 25 mg and Lisinopril to 5mg  for better BP/HR control  - Continues to diurese. Continue IV Lasix additional day.  DKA / HONK - uncontrolled DM  - DKA/HONK resolved - A1c markedly elevated at 13.4 - pt was reportedly on no prior outpt meds - clearly needs insulin - diabetic teaching ongoing  - titrating medications  - better controlled.  Acute renal insufficiency - Resolved  Hypokalemia - Corrected  Thrombocytopenia secondary to gram negative sepsis HIT panel negative 0/17/14 - resolved   Anemia - likely of chronic disease based upon Anemia panel - will need outpt f/u once more stable   Code Status: FULL  Family Communication: Discussed with patient's boyfriend at bedside.  Disposition Plan: Possible discharge home 11/2  Antibiotics: Ceftriaxone 10/15, 10-18>>10/20, 10/25 >> 10/27 Vancomycin 10/16, 10/20 >> 10/21  Zosyn 10/16>>>10/17, 10/20 >> 10/25  DVT prophylaxis: On Xarelto  HPI/Subjective: Denies dyspnea. Leg swelling decreasing.  Objective: Blood pressure 118/60, pulse 90, temperature 98 F (36.7 C), temperature source Oral, resp. rate 18, height 5\' 4"  (1.626 m), weight 33.748 kg (74 lb 6.4 oz), last menstrual period 10/04/2013, SpO2 96.00%.  Intake/Output Summary (Last 24 hours) at 10/08/13 1117 Last data filed at 10/08/13 0556  Gross per 24 hour  Intake    610 ml  Output   2750 ml  Net  -2140 ml   Exam: General:  Patient sitting  up in bed comfortably. Lungs:  basal crackles  bilaterally, but otherwise clear to auscultation and no increased work of breathing. Pt currently on oxygen  Cardiovascular: Regular rate and rhythm without murmur gallop or rub normal S1 and S2. 1+ bilateral pitting leg edema. Abdomen: symmetric, soft, nontender, non distended. bowel sounds positive. Extremities: No significant cyanosis, clubbing. Symmetric 5/5 power. CNS: Alert and oriented. No focal neurological deficit. Old  right facial weakness-since childhood.  Data Reviewed: Basic Metabolic Panel:  Recent Labs Lab 10/01/13 1500 10/02/13 0500  10/04/13 0535 10/05/13 0615 10/06/13 0528 10/07/13 0439 10/08/13 0610  NA 149* 144  < > 142 140 139 143 142  K 3.3* 3.0*  < > 3.6 3.3* 3.9 3.5 3.5  CL 112 108  < > 105 105 105 106 104  CO2 30 27  < > 26 28 27 29 27   GLUCOSE 281* 183*  < > 181* 161* 229* 237* 98  BUN 22 18  < > 9 8 8 8 8   CREATININE 1.03 0.89  < > 0.84 0.87 0.87 0.92 0.90  CALCIUM 8.1* 7.7*  < > 8.3* 8.5 8.4 8.3* 8.5  MG  --  1.8  --   --   --   --   --   --   PHOS  --  2.3  --   --   --   --   --   --   < > = values in this interval not displayed. Liver Function Tests: No results found for this basename: AST, ALT, ALKPHOS, BILITOT, PROT, ALBUMIN,  in the last 168 hours  CBC:  Recent Labs Lab 10/02/13 0500 10/03/13 0420 10/04/13 0535 10/05/13 0615  WBC 17.8* 15.6* 13.6* 9.7  HGB 8.5* 8.6* 8.2* 7.9*  HCT 26.0* 27.2* 24.7* 23.7*  MCV 72.0* 72.7* 72.2* 72.0*  PLT 231 262 269 287   CBG:  Recent Labs Lab 10/07/13 0647 10/07/13 1127 10/07/13 1648 10/07/13 2102 10/08/13 0601  GLUCAP 207* 153* 186* 153* 105*   Additional labs Lipid panel: cholesterol 126, Triglycerides: 127, HDL:25, LDL cholesterol:76, VLDL: 25, total chol/HDL ratio :5.0  Stress test :No evidence of pharmacological induced myocardial ischemia or focal wall motion abnormality. The calculated left ventricular ejection fraction is 48%. CTA of chest: Bilateral pulmonary emboli. Pulmonary edema and moderate-sized bilateral pleural effusions. Upper lobe airspace opacities could be a combination of asymmetric edema, scarring or infiltrates.    No results found for this or any previous visit (from the past 240 hour(s)).   Studies:  Recent x-ray studies have been reviewed in detail by the Attending Physician  Scheduled Meds:  Scheduled Meds: . carvedilol  25 mg Oral BID WC  . chlorhexidine  15 mL Mouth Rinse  BID  . famotidine  20 mg Oral Daily  . furosemide  40 mg Intravenous Q8H  . insulin aspart  0-15 Units Subcutaneous TID WC  . insulin aspart  0-5 Units Subcutaneous QHS  . insulin aspart  6 Units Subcutaneous TID WC  . insulin glargine  30 Units Subcutaneous QHS  . lisinopril  5 mg Oral Daily  . Rivaroxaban  15 mg Oral BID WC  . [START ON 10/29/2013] rivaroxaban  20 mg Oral Q supper   Time spent on care of this patient: 18 min  Lot Medford, MD, FACP, FHM. [facility] Pager [phone]  If 7PM-7AM, please contact night-coverage [email or web address] Password [email or web address] 10/08/2013, 11:17 AM   LOS: 17 days

## 2013-10-08 NOTE — Progress Notes (Signed)
02 sat off oxygen, sitting, 95%. sats ranged from 92-95% while walking in the hall off the 02.

## 2013-10-09 LAB — CBC
HCT: 26.5 % — ABNORMAL LOW (ref 36.0–46.0)
Hemoglobin: 8.6 g/dL — ABNORMAL LOW (ref 12.0–15.0)
MCH: 23.8 pg — ABNORMAL LOW (ref 26.0–34.0)
MCHC: 32.5 g/dL (ref 30.0–36.0)
MCV: 73.4 fL — ABNORMAL LOW (ref 78.0–100.0)
Platelets: 386 10*3/uL (ref 150–400)
RBC: 3.61 MIL/uL — ABNORMAL LOW (ref 3.87–5.11)
RDW: 17.5 % — ABNORMAL HIGH (ref 11.5–15.5)
WBC: 7 10*3/uL (ref 4.0–10.5)

## 2013-10-09 LAB — BASIC METABOLIC PANEL
BUN: 9 mg/dL (ref 6–23)
CO2: 26 mEq/L (ref 19–32)
Calcium: 8.7 mg/dL (ref 8.4–10.5)
Chloride: 105 mEq/L (ref 96–112)
Creatinine, Ser: 0.95 mg/dL (ref 0.50–1.10)
GFR calc Af Amer: 79 mL/min — ABNORMAL LOW (ref >90)
GFR calc non Af Amer: 68 mL/min — ABNORMAL LOW (ref >90)
Glucose, Bld: 142 mg/dL — ABNORMAL HIGH (ref 70–99)
Potassium: 3.2 mEq/L — ABNORMAL LOW (ref 3.5–5.1)
Sodium: 140 mEq/L (ref 135–145)

## 2013-10-09 LAB — GLUCOSE, CAPILLARY: Glucose-Capillary: 190 mg/dL — ABNORMAL HIGH (ref 70–99)

## 2013-10-09 MED ORDER — POTASSIUM CHLORIDE CRYS ER 10 MEQ PO TBCR: 10.0000 meq | EXTENDED_RELEASE_TABLET | Freq: Every day | ORAL | Status: DC

## 2013-10-09 MED ORDER — UNABLE TO FIND: Status: DC

## 2013-10-09 MED ORDER — CARVEDILOL 25 MG PO TABS: 25.0000 mg | ORAL_TABLET | Freq: Two times a day (BID) | ORAL | Status: DC

## 2013-10-09 MED ORDER — INSULIN ASPART 100 UNIT/ML FLEXPEN: 6.0000 [IU] | PEN_INJECTOR | Freq: Three times a day (TID) | SUBCUTANEOUS | Status: DC

## 2013-10-09 MED ORDER — INSULIN GLARGINE 100 UNIT/ML SOLOSTAR PEN: 30.0000 [IU] | PEN_INJECTOR | Freq: Every day | SUBCUTANEOUS | Status: DC

## 2013-10-09 MED ORDER — INSULIN PEN NEEDLE 29G X 12MM MISC: Status: DC

## 2013-10-09 MED ORDER — LISINOPRIL 5 MG PO TABS: 5.0000 mg | ORAL_TABLET | Freq: Every day | ORAL | Status: DC

## 2013-10-09 MED ORDER — RIVAROXABAN 20 MG PO TABS: 20.0000 mg | ORAL_TABLET | Freq: Every day | ORAL | Status: DC

## 2013-10-09 MED ORDER — INSULIN PEN STARTER KIT
1.0000 | Freq: Once | Status: AC
  Administered 2013-10-09: 1
  Filled 2013-10-09: qty 1

## 2013-10-09 MED ORDER — RIVAROXABAN 15 MG PO TABS: 15.0000 mg | ORAL_TABLET | Freq: Two times a day (BID) | ORAL | Status: DC

## 2013-10-09 MED ORDER — FUROSEMIDE 40 MG PO TABS: 40.0000 mg | ORAL_TABLET | Freq: Every day | ORAL | Status: DC

## 2013-10-09 MED ORDER — POTASSIUM CHLORIDE CRYS ER 20 MEQ PO TBCR
40.0000 meq | EXTENDED_RELEASE_TABLET | Freq: Once | ORAL | Status: AC
  Administered 2013-10-09: 40 meq via ORAL
  Filled 2013-10-09: qty 2

## 2013-10-09 NOTE — Progress Notes (Signed)
Patient given insulin pen started kit- contents explained to patient

## 2013-10-09 NOTE — Progress Notes (Signed)
Confirmed with patient that she will be picking up her Rxs at [place], [place] [phone]. I called that pharmacy and they are open today from 10am-6pm).   Minor, Dirk Dress

## 2013-10-09 NOTE — Progress Notes (Signed)
Called pharmacy who confirmed entry of Mil[place] into discharge chart. Called [facility] about setting up [facility] PT and [place] RN (which Dr Algis Liming told me he would order). Also told [facility] about patient request for vouchers/coupons for insulin and Xarelto.

## 2013-10-09 NOTE — Consult Note (Signed)
Received notice of Home health Referral, patient is staying at her Mothers Address, that is out of [facility] service area Post-Discharge. CM [name] Notified Also informed CM that patients updated SAT levels do not qualify them for Oxygen.

## 2013-10-09 NOTE — Progress Notes (Signed)
Subjective: Denies CP  Breathing is better   Objective: Filed Vitals:   10/08/13 2140 10/09/13 0115 10/09/13 0500 10/09/13 0515  BP: 131/72 115/67  116/80  Pulse: 98 99  94  Temp: 98.8 F (37.1 C) 98.7 F (37.1 C)  98.2 F (36.8 C)  TempSrc: Oral Oral  Oral  Resp: 18 18  18   Height:      Weight:   152 lb 11.2 oz (69.264 kg)   SpO2: 92% 95%  95%   Weight change: 78 lb 4.8 oz (35.517 kg)  Intake/Output Summary (Last 24 hours) at 10/09/13 0846 Last data filed at 10/09/13 [id number]  Gross per 24 hour  Intake     40 ml  Output   1450 ml  Net  -1410 ml   Net I/O 4.16L  General: Alert, awake, oriented x3, in no acute distress Neck:  JVP is normal Heart: Regular rate and rhythm, without murmurs, rubs, gallops.  Lungs: Clear to auscultation.  No rales or wheezes. Exemities:  No edema.   Neuro: Grossly intact, nonfocal.   Lab Results: Results for orders placed during the hospital encounter of 09/21/13 (from the past 24 hour(s))  GLUCOSE, CAPILLARY     Status: Abnormal   Collection Time    10/08/13 11:30 AM      Result Value Range   Glucose-Capillary 102 (*) 70 - 99 mg/dL   Comment 1 Documented in Chart    GLUCOSE, CAPILLARY     Status: Abnormal   Collection Time    10/08/13  4:24 PM      Result Value Range   Glucose-Capillary 133 (*) 70 - 99 mg/dL   Comment 1 Documented in Chart    GLUCOSE, CAPILLARY     Status: Abnormal   Collection Time    10/08/13  9:40 PM      Result Value Range   Glucose-Capillary 115 (*) 70 - 99 mg/dL   Comment 1 Documented in Chart     Comment 2 Notify RN    BASIC METABOLIC PANEL     Status: Abnormal   Collection Time    10/09/13  7:13 AM      Result Value Range   Sodium 140  135 - 145 mEq/L   Potassium 3.2 (*) 3.5 - 5.1 mEq/L   Chloride 105  96 - 112 mEq/L   CO2 26  19 - 32 mEq/L   Glucose, Bld 142 (*) 70 - 99 mg/dL   BUN 9  6 - 23 mg/dL   Creatinine, Ser 0.95  0.50 - 1.10 mg/dL   Calcium 8.7  8.4 - 10.5 mg/dL   GFR calc non Af Amer 68  (*) >90 mL/min   GFR calc Af Amer 79 (*) >90 mL/min  CBC     Status: Abnormal   Collection Time    10/09/13  7:13 AM      Result Value Range   WBC 7.0  4.0 - 10.5 K/uL   RBC 3.61 (*) 3.87 - 5.11 MIL/uL   Hemoglobin 8.6 (*) 12.0 - 15.0 g/dL   HCT 26.5 (*) 36.0 - 46.0 %   MCV 73.4 (*) 78.0 - 100.0 fL   MCH 23.8 (*) 26.0 - 34.0 pg   MCHC 32.5  30.0 - 36.0 g/dL   RDW 17.5 (*) 11.5 - 15.5 %   Platelets 386  150 - 400 K/uL    Studies/Results: @RISRSLT24 @  Medications: Reviewed   @PROBHOSP @  1  Acute systolic CHF of LV and RV  Volume is improved  WOuld send home on Lasix 40  With 10 of KCL  Will f/u in clinic this wk  2.  PE  ON anticoag  3.  Sepsis resolved  4.  Renal  Good  5.  CV dz  Mild.    Will contact pt tomorrow re apptts.  LOS: 18 days   Dorris Carnes 10/09/2013, 8:46 AM

## 2013-10-09 NOTE — Discharge Summary (Signed)
Physician Discharge Summary  Colleen [name] [id number] DOB: 05/06/1961 DOA: 09/21/2013  PCP: No PCP Per Patient  Admit date: 09/21/2013 Discharge date: 10/09/2013  Time spent: Greater than 30 minutes  Recommendations for Outpatient Follow-up:  1. Dr. Maury Dus, PCP (New), in 1 week with repeat labs (CBC & BMP). 2. Dr. Lauree Chandler, Cardiology: MD's office will call patient with appointment. 3. Home Health PT, RN, [place] with 5" wheels. 4. OP Diabetes education. 5. Recommend CT Chest in 3 months to follow up on bilateral upper lobe air space opacities that was seen on CTA chest.  Discharge Diagnoses:  Principal Problem:   Septic shock(785.52) Active Problems:   Acute respiratory failure with hypoxia   Bacteremia   ARDS (adult respiratory distress syndrome)   Acute renal insufficiency   Acute on chronic systolic heart failure   E-coli UTI   DM (diabetes mellitus), type 2, uncontrolled   Congestive dilated cardiomyopathy   Carotid bruit   HTN (hypertension)   Acute pulmonary embolism   Discharge Condition: Improved & Stable  Diet recommendation: Heart Healthy & Diabetic diet.  Filed Weights   10/07/13 0500 10/08/13 0500 10/09/13 0500  Weight: 76 kg (167 lb 8.8 oz) 33.748 kg (74 lb 6.4 oz) 69.264 kg (152 lb 11.2 oz)    History of present illness:  52 y/o female with signif hx only for DM who presented to [place] with weakness and emesis found to be in DKA vs HONK with shock requiring pressors. Intubated 10-18. Admitted to [facility] service initially.  Hospital Course:   Bilateral PE  - CTA revealed bilateral PE with large clot burden. - Pt was initially started on Lovenox on 10/06/2013 which was DC'ed once Xarelto was started. - Started Xarelto on 10/31: will need AC for at least 6 months.  - CM provided patient with discount co pay coupons.  Septic shock - due to E coli UTI and bacteremia. Principal Diagnosis - has completed 14 days of total  antibiotics  - Discussed with infectious disease M.D. on call 10/29: Recommended: 10-14 days of total antibiotics. DCed antibiotics and monitor.  - resolved   Acute respiratory failure with hypoxia due to ARDS / ALI   - Multifactorial secondary to decompensated CHF, PE and pleural effusions.  - Hypoxia resolved   Acute systolic heart failure/cardiomyopathy  - ?due to septic shock alone - will need repeat echo in outpt f/u once clinically normalized  - Cardiology consulted. BB and ACEI were initiated. She was diuresed with IV Lasix.  - Myoview: No evidence of ischemia and EF 48%.  - Cardiology increased Coreg to 25 mg and Lisinopril to 5mg  for better BP/HR control  - Compensated at DC.  - OP FU with Cardiology.  DKA / HONK - uncontrolled DM  - DKA/HONK resolved  - A1c markedly elevated at 13.4 - pt was reportedly on no prior outpt meds - clearly needs insulin - diabetic teaching ongoing  - titrating medications  - better controlled.   Acute renal insufficiency  - Resolved   Hypokalemia  - Repleted prior to DC.  Thrombocytopenia secondary to gram negative sepsis  HIT panel negative 0/17/14 - resolved   Anemia  - likely of chronic disease based upon Anemia panel - will need outpt f/u once more stable    Consultations:  Cardiology  PCCM  Procedures:  [facility] central line.  Intubation/extubation    Discharge Exam:  Complaints:  Denies complaints. Leg swelling resolved   Filed Vitals:  10/08/13 2140 10/09/13 0115 10/09/13 0500 10/09/13 0515  BP: 131/72 115/67  116/80  Pulse: 98 99  94  Temp: 98.8 F (37.1 C) 98.7 F (37.1 C)  98.2 F (36.8 C)  TempSrc: Oral Oral  Oral  Resp: 18 18  18   Height:      Weight:   69.264 kg (152 lb 11.2 oz)   SpO2: 92% 95%  95%    General: Patient sitting up in bed comfortably.  Lungs: occasional basal crackles bilaterally, but otherwise clear to auscultation and no increased work of breathing.  Cardiovascular: Regular rate and  rhythm without murmur gallop or rub. normal S1 and S2. No leg edema.  Abdomen: symmetric, soft, nontender, non distended. bowel sounds positive.  Extremities: No significant cyanosis, clubbing. Symmetric 5/5 power.  CNS: Alert and oriented. No focal neurological deficit. Old right facial weakness-since childhood.  Discharge Instructions      Discharge Orders   Future Orders Complete By Expires   ([place]) Call MD:  Anytime you have any of the following symptoms: 1) 3 pound weight gain in 24 hours or 5 pounds in 1 week 2) shortness of breath, with or without a dry hacking cough 3) swelling in the hands, feet or stomach 4) if you have to sleep on extra pillows at night in order to breathe.  As directed    Ambulatory referral to Nutrition and Diabetic Education  As directed    Comments:     Patient diagnosed with diabetes 4-5 years ago.  Needs diet education.  Is in ICU at this time.   Call MD for:  difficulty breathing, headache or visual disturbances  As directed    Call MD for:  extreme fatigue  As directed    Call MD for:  persistant dizziness or light-headedness  As directed    Call MD for:  temperature >100.4  As directed    Diet - low sodium heart healthy  As directed    Diet Carb Modified  As directed    Increase activity slowly  As directed        Medication List         carvedilol 25 MG tablet  Commonly known as:  COREG  Take 1 tablet (25 mg total) by mouth 2 (two) times daily with a meal.     furosemide 40 MG tablet  Commonly known as:  LASIX  Take 1 tablet (40 mg total) by mouth daily.     insulin aspart 100 UNIT/ML Sopn FlexPen  Commonly known as:  NOVOLOG FLEXPEN  Inject 6 Units into the skin 3 (three) times daily with meals.     Insulin Glargine 100 UNIT/ML Sopn  Inject 30 Units into the skin at bedtime.     Insulin Pen Needle 29G X 12MM Misc  Use as directed with Insulin pens.     lisinopril 5 MG tablet  Commonly known as:  PRINIVIL,ZESTRIL   Take 1 tablet (5 mg total) by mouth daily.     potassium chloride 10 MEQ tablet  Commonly known as:  K-DUR,KLOR-CON  Take 1 tablet (10 mEq total) by mouth daily.     Rivaroxaban 15 MG Tabs tablet  Commonly known as:  XARELTO  Take 1 tablet (15 mg total) by mouth 2 (two) times daily with a meal. After completing this prescription, start the once daily prescription.     Rivaroxaban 20 MG Tabs tablet  Commonly known as:  XARELTO  Take 1 tablet (20 mg total)  by mouth daily with supper. First dose on Sat 10/29/13 at 1700  Start taking on:  10/29/2013     UNABLE TO FIND  - 1. Glucometer - 1  - 2. Glucometer strips - 1 box.  - 3. Lancets - 1 box       Follow-up Information   Follow up with Vena Austria, MD. Schedule an appointment as soon as possible for a visit in 1 week. (To be seen with repeat labs (CBC & BMP).)    Specialty:  Family Medicine   Contact information:   [address] [place] [address] [phone]       Follow up with Lauree Chandler, MD. (MD's office will call with follow up appointment. Please call them if you don't hear from them in 2-3 days.)    Specialty:  Cardiology   Contact information:   [place] [place] [phone]        The results of significant diagnostics from this hospitalization (including imaging, microbiology, ancillary and laboratory) are listed below for reference.    Significant Diagnostic Studies: Ct Angio Chest Pe W/cm &/or Wo Cm  10/06/2013   CLINICAL DATA:  Chest pain and elevated D-dimer  EXAM: CT ANGIOGRAPHY CHEST WITH CONTRAST  TECHNIQUE: Multidetector CT imaging of the chest was performed using the standard protocol during bolus administration of intravenous contrast. Multiplanar CT image reconstructions including MIPs were obtained to evaluate the vascular anatomy.  CONTRAST:  142mL OMNIPAQUE IOHEXOL 350 MG/ML SOLN  COMPARISON:  None.  FINDINGS: The chest wall  is unremarkable. No breast masses, supraclavicular or axillary adenopathy. The bony thorax is intact. No destructive bone lesions or spinal canal compromise.  The heart is within normal limits in size. No pericardial effusion. Fluid noted in the pericardial recesses. Prominent mediastinal and hilar lymph nodes. The esophagus is grossly normal. The aorta is normal in caliber. No dissection. The branch vessels are patent.  The pulmonary arterial tree is fairly well opacified. There are bilateral upper and lower lobe pulmonary emboli. No large central saddle embolus but significant clot burden bilaterally.  Examination of the lung parenchyma demonstrates interstitial and alveolar pulmonary edema and moderate size bilateral pleural effusions. Bilateral upper lobe airspace opacities could reflect asymmetric edema, scarring change and less likely infiltrates. CT followup suggested.  The upper abdomen is grossly normal.  Review of the MIP images confirms the above findings.  IMPRESSION: 1. Bilateral pulmonary emboli. 2. Pulmonary edema and moderate-sized bilateral pleural effusions. 3. Upper lobe airspace opacities could be a combination of asymmetric edema, scarring or infiltrates. CT followup suggested in 3 months. 4. Normal thoracic aorta. These results will be called to the ordering clinician or representative by the Radiologist Assistant, and communication documented in the PACS Dashboard.   Electronically Signed   By: Kalman Jewels M.D.   On: 10/06/2013 14:23   US Renal  09/26/2013   CLINICAL DATA:  Evaluate for obstruction.  EXAM: RENAL/URINARY TRACT ULTRASOUND COMPLETE  COMPARISON:  None.  FINDINGS: Right Kidney  Length: Measures 11.7 cm. Normal renal cortical thickness and echogenicity. No hydronephrosis.  Left Kidney  Length: Measures 12.6 cm. Normal renal cortical thickness and echogenicity. Minimal pelviectasis.  Bladder  Grossly unremarkable. Contains a Foley catheter.  Bilateral pleural effusions.   IMPRESSION: Mild left renal pelviectasis  Bilateral pleural effusions.   Electronically Signed   By: Lovey Newcomer M.D.   On: 09/26/2013 16:44   Nm Myocar Multi W/spect W/wall Motion / Ef  10/06/2013   CLINICAL DATA:  Chest pain with uncontrolled diabetes.  EXAM: MYOCARDIAL IMAGING WITH SPECT (REST AND EXERCISE)  GATED LEFT VENTRICULAR WALL MOTION STUDY  LEFT VENTRICULAR EJECTION FRACTION  TECHNIQUE: Standard myocardial SPECT imaging was performed after resting intravenous injection of 10 mCi Tc-74m sestamibi. Subsequently, exercise tolerance test was performed by the patient under the supervision of the Cardiology staff. At peak-stress, 30 mCi Tc-29m sestamibi was injected intravenously and standard myocardial SPECT imaging was performed. Quantitative gated imaging was also performed to evaluate left ventricular wall motion, and estimate left ventricular ejection fraction.  COMPARISON:  None.  FINDINGS: SPECT images demonstrate normal left ventricular activity. There are no fixed or reversible perfusion defects.  Gated images were reviewed and demonstrate normal left ventricular wall motion and systolic thickening. The QGS ejection fraction calculated at rest is 48% with an end-diastolic volume of [id number] and an end-systolic volume of 123456.  IMPRESSION: No evidence of pharmacological induced myocardial ischemia or focal wall motion abnormality. The calculated left ventricular ejection fraction is 48%.   Electronically Signed   By: Camie Patience M.D.   On: 10/06/2013 12:25   Dg Chest Port 1 View  10/01/2013   CLINICAL DATA:  Respiratory failure, extubated  EXAM: PORTABLE CHEST - 1 VIEW  COMPARISON:  Prior chest x-ray 09/30/2013  FINDINGS: Interval extubation and removal of nasogastric tube. Left subclavian approach central venous catheter remains in good position with the tip at the superior cavoatrial junction. Stable cardiac and mediastinal contours. Persistent diffuse bilateral interstitial and airspace  opacities favored to reflect pulmonary edema. Probable small bilateral pleural effusions and bibasilar atelectasis.  IMPRESSION: 1. Interval extubation and removal of nasogastric tube. Stable position of left subclavian catheter. 2. Persistent pulmonary edema and bibasilar atelectasis versus infiltrates.   Electronically Signed   By: Jacqulynn Cadet M.D.   On: 10/01/2013 07:57   Dg Chest Port 1 View  09/30/2013   CLINICAL DATA:  hypoxia  EXAM: PORTABLE CHEST - 1 VIEW  COMPARISON:  None.  FINDINGS: The endotracheal tube tip is 3.6 cm above the carinal. Central catheter tip is at the cavoatrial junction. The nasogastric tube tip and side port are below the diaphragm. No pneumothorax. There is mild cardiac enlargement with interstitial edema. There is patchy airspace consolidation in the left base.  IMPRESSION: The tube and catheter positions are as described. No pneumothorax. Evidence of a degree of congestive heart failure. Cannot exclude superimposed pneumonia left base. These changes are similar to 1 day prior.   Electronically Signed   By: Lowella Grip M.D.   On: 09/30/2013 07:22   Dg Chest Port 1 View  09/29/2013   CLINICAL DATA:  Check endotracheal tube.  EXAM: PORTABLE CHEST - 1 VIEW  COMPARISON:  09/28/2013  FINDINGS: Endotracheal tube is 3.1 cm above the carina. Nasogastric tube extends into the stomach region. Left subclavian central line in the lower SVC region. Patchy densities are consistent with airspace disease or edema. Probable small effusions. Heart size is grossly stable.  IMPRESSION: Minimal change from the previous examination. Persistent bilateral airspace densities.  Stable support apparatuses.   Electronically Signed   By: Markus Daft M.D.   On: 09/29/2013 07:53   Dg Chest Port 1 View  09/28/2013   CLINICAL DATA:  Respiratory failure  EXAM: PORTABLE CHEST - 1 VIEW  COMPARISON:  09/27/2013  FINDINGS: Cardiac shadow is stable. Diffuse bilateral airspace disease is again  identified. The overall appearance is stable from the prior study. No new  focal abnormality is noted. An endotracheal tube, nasogastric catheter and left central venous line are stable.  IMPRESSION: No change from the previous day.   Electronically Signed   By: Inez Catalina M.D.   On: 09/28/2013 07:17   Dg Chest Port 1 View  09/27/2013   CLINICAL DATA:  Intubated, shortness of breath.  EXAM: PORTABLE CHEST - 1 VIEW  COMPARISON:  09/26/2013  FINDINGS: Severe diffuse bilateral airspace disease. Heart is normal size. No visible effusions. Support devices are unchanged.  IMPRESSION: Stable severe bilateral airspace disease. Slight increase in lung volumes.   Electronically Signed   By: Rolm Baptise M.D.   On: 09/27/2013 07:37   Dg Chest Port 1 View  09/26/2013   ADDENDUM REPORT: 09/26/2013 07:51  ADDENDUM: Correction: Comparison exam is 09/25/2013   Electronically Signed   By: Markus Daft M.D.   On: 09/26/2013 07:51   09/26/2013   CLINICAL DATA:  Check endotracheal tube.  EXAM: PORTABLE CHEST - 1 VIEW  COMPARISON:  09/1913  FINDINGS: Endotracheal tube is 4.6 cm above the carina. Nasogastric tube extends into the abdomen. Bilateral airspace disease has minimally changed but may have slightly improved in the right lung. Heart size is grossly unchanged in size. Left subclavian central venous catheter tip is in the lower SVC. No evidence for a pneumothorax.  IMPRESSION: Persistent bilateral airspace disease. Overall, there has been minimal change in the airspace disease but it may be slightly less dense in the right lung.  Stable support apparatuses.  Electronically Signed: By: Markus Daft M.D. On: 09/26/2013 07:44   Dg Chest Port 1 View  09/25/2013   CLINICAL DATA:  Respiratory failure.  EXAM: PORTABLE CHEST - 1 VIEW  COMPARISON:  09/24/2013.  FINDINGS: The support apparatus is stable. The cardiac silhouette, mediastinal and hilar contours are unchanged. There is a persistent diffuse airspace process.   IMPRESSION: Stable support apparatus.  Persistent diffuse airspace process.   Electronically Signed   By: Kalman Jewels M.D.   On: 09/25/2013 08:00   Dg Chest Port 1 View  09/24/2013   CLINICAL DATA:  Central line placement.  EXAM: PORTABLE CHEST - 1 VIEW  COMPARISON:  09/24/2013  FINDINGS: Left subclavian central line is in place with the tip at the cavoatrial junction. No pneumothorax. Diffuse bilateral airspace opacities again noted, unchanged. No visible effusions. Heart is normal size. Endotracheal tube is 3 cm above the carina.  IMPRESSION: Endotracheal tube 3 cm above the carina. Left subclavian central line tip at the cavoatrial junction. No pneumothorax. Continued diffuse bilateral airspace disease.   Electronically Signed   By: Rolm Baptise M.D.   On: 09/24/2013 21:29   Dg Chest Port 1 View  09/24/2013   CLINICAL DATA:  Low O2 sats.  EXAM: PORTABLE CHEST - 1 VIEW  COMPARISON:  09/23/2013  FINDINGS: Diffuse airspace opacities, worsened since prior study. This could represent edema, hemorrhage or infection. Heart is upper limits normal in size. No visible effusions.  IMPRESSION: Worsening diffuse bilateral airspace disease.   Electronically Signed   By: Rolm Baptise M.D.   On: 09/24/2013 17:27   Dg Chest Port 1 View  09/23/2013   CLINICAL DATA:  Pulmonary edema.  EXAM: PORTABLE CHEST - 1 VIEW  COMPARISON:  09/22/2013  FINDINGS: Left-sided central line has been removed.  Lung apices not entirely included on present exam limiting evaluation for detection of small pneumothorax. No gross pneumothorax detected.  Slightly asymmetric airspace disease suggestive of pulmonary edema although infectious  process not excluded in the proper clinical setting.  Heart size top-normal  IMPRESSION: Slightly asymmetric diffuse airspace disease similar to prior exam. This may represent pulmonary edema although infectious infiltrate could not be excluded in the proper clinical setting.  Left central line removed   Lung apices not entirely included on present exam.   Electronically Signed   By: Chauncey Cruel M.D.   On: 09/23/2013 08:01   Dg Chest Port 1 View  09/22/2013   CLINICAL DATA:  Pulmonary edema, central line placement.  EXAM: PORTABLE CHEST - 1 VIEW  COMPARISON:  September 22, 2013.  FINDINGS: There has been interval placement of left internal jugular catheter line with distal tip in the expected position of the right atrium ; it is recommended to be withdrawn 1-2 cm. Stable mild cardiomegaly. Moderate central pulmonary vascular congestion is again noted and unchanged. Increased alveolar opacity is noted in the right lung base concerning for pneumonia or atelectasis. No pneumothorax is noted.  IMPRESSION: Increased right basilar opacity concerning for pneumonia or subsegmental atelectasis. Stable cardiomegaly and central pulmonary vascular congestion is noted. Interval placement of left internal jugular catheter line with distal tip in the expected position of the right atrium ; it is recommended at be withdrawn by 1-2 cm.   Electronically Signed   By: Sabino Dick M.D.   On: 09/22/2013 07:27   Dg Chest Port 1 View  09/22/2013   CLINICAL DATA:  Emesis and diarrhea for 1 week, with chills and fever.  EXAM: PORTABLE CHEST - 1 VIEW  COMPARISON:  None.  FINDINGS: Cardiomegaly. Low lung volumes. Moderate vascular congestion. No overt failure or areas of lobar consolidation. No effusion or pneumothorax. Negative osseous structures. No visible free air on this portable semi erect radiograph.  IMPRESSION: Cardiomegaly with low lung volumes. No definite active infiltrates or failure.   Electronically Signed   By: Rolla Flatten M.D.   On: 09/22/2013 01:34    Microbiology: No results found for this or any previous visit (from the past 240 hour(s)).   Labs: Basic Metabolic Panel:  Recent Labs Lab 10/05/13 0615 10/06/13 0528 10/07/13 0439 10/08/13 0610 10/09/13 0713  NA 140 139 143 142 140  K 3.3* 3.9 3.5 3.5  3.2*  CL 105 105 106 104 105  CO2 28 27 29 27 26   GLUCOSE 161* 229* 237* 98 142*  BUN 8 8 8 8 9   CREATININE 0.87 0.87 0.92 0.90 0.95  CALCIUM 8.5 8.4 8.3* 8.5 8.7   Liver Function Tests: No results found for this basename: AST, ALT, ALKPHOS, BILITOT, PROT, ALBUMIN,  in the last 168 hours No results found for this basename: LIPASE, AMYLASE,  in the last 168 hours No results found for this basename: AMMONIA,  in the last 168 hours CBC:  Recent Labs Lab 10/03/13 0420 10/04/13 0535 10/05/13 0615 10/09/13 0713  WBC 15.6* 13.6* 9.7 7.0  HGB 8.6* 8.2* 7.9* 8.6*  HCT 27.2* 24.7* 23.7* 26.5*  MCV 72.7* 72.2* 72.0* 73.4*  PLT 262 269 287 386   Cardiac Enzymes: No results found for this basename: CKTOTAL, CKMB, CKMBINDEX, TROPONINI,  in the last 168 hours BNP: BNP (last 3 results)  Recent Labs  09/28/13 0425  PROBNP 2967.0*   CBG:  Recent Labs Lab 10/07/13 2102 10/08/13 0601 10/08/13 1130 10/08/13 1624 10/08/13 2140  GLUCAP 153* 105* 102* 133* 115*    Additional labs: 1. Lipid panel: cholesterol 126, Triglycerides: 127, HDL:25, LDL cholesterol:76, VLDL: 25, total chol/HDL ratio :5.0. 2.  Anemia Panel: Iron 64, TIBC 206, Saturation ratio 31, Ferritin 482 3. A1C: 13.4 4. TSH: 2.109, FT4: 0.95 5. Urine Pregnancy test: Negative. 6. Stool C Diff PCR Neg 7. Stool pathogen panel: Negative. 8. 2 D Echo: Study Conclusions  - Left ventricle: The cavity size was normal. Wall thickness was normal. Systolic function was moderately to severely reduced. The estimated ejection fraction was in the range of 30% to 35%. Regional wall motion abnormalities cannot be excluded. The study is not technically sufficient to allow evaluation of LV diastolic function. - Right ventricle: Systolic function was moderately reduced. - Atrial septum: No defect or patent foramen ovale was identified. 9. Carotid Dopplers: Summary: Bilateral: intimal wall thickening CCA. Mild soft plaque origin ICA.  Vertebral artery flow is antegrade. ICA/CCA ratio: R-1.0 L-1.0   Signed:  Vernell Leep, MD, FACP, FHM. [facility] Pager [phone]  If 7PM-7AM, please contact night-coverage [email or web address] Password [facility] 10/09/2013, 10:41 AM

## 2013-10-09 NOTE — Progress Notes (Signed)
CARE MANAGEMENT NOTE 10/09/2013  Patient:  Colleen [name], Colleen [name]   Account Number:  [id number]  Date Initiated:  09/22/2013  Documentation initiated by:  [facility]  Subjective/Objective Assessment:   DKA, sepsis.  On pressors and insuling drip.     Action/Plan:   PT/OT evals   Anticipated DC Date:  10/06/2013   Anticipated DC Plan:  [place]  CM consult      Choice offered to / List presented to:  C-1 Patient   DME arranged  Vassie Moselle      DME agency  [facility] arranged  HH-1 RN  [place] agency  [place]   Status of service:  In process, will continue to follow Medicare Important Message given?   (If response is "NO", the following Medicare IM given date fields will be blank) Date Medicare IM given:   Date Additional Medicare IM given:    Discharge Disposition:    Per UR Regulation:  Reviewed for med. necessity/level of care/duration of stay  If discussed at [place] of Stay Meetings, dates discussed:   09/27/2013  09/29/2013  10/04/2013    Comments:  10/09/13 CM met with pt in room as pt states she has no computer to activate her Xarelto card.  Pt also states she has already called the PCP list previous CM gave her and has an appt with [facility] to secure PCP. Pt no longer needs 02 and [facility] already picked up from room. Pt states she will be staying with her mother after discharge:               [name]               [address]               [place], [place] [address]  Phone: [phone].  Will leave note to CM concerning discharge on Sunday (as [facility] not open to receive call) to notify of discharge. No other CM needs were communicated.  [name], BSN, CM [phone].  ContactBevin, [name] [phone]  10/07/13 [place], MSN, CM- Met with patient to provide Xarelto copay assistance card and reinforce importance of finding a PCP  prior to discharge.   10/07/13 [place], MSN, CM- Spoke with [facility] to notify that anticipated discharge has now been anticipated in 1-2 days.  CM was asked to have RN contact [place] at [phone] if patient will be discharged on Saturday.  The office will be open from 0800 to 1600.  CM will make RN aware.  10/06/13 [place], MSN, CM- Patient currently unavailable to discuss PCP. CM contacted [name] with [facility] to notify that patient may be discharged later today, pending test results.    10/05/13 [place], MSN, CM- Met with patient to determine if she had been able to find a PCP. Patient stated that she did not pursue past the automated system.  CM stressed the importance of having a PCP due to chronic health conditions, Home therapies and Home Oxygen. Patient attempted to contact [facility] while CM was in the room, left her phone number for a call-back.    10/05/13 [place], MSN, CM- Orders were faxed to [facility] as requested.  Derrian with [facility] was notified  of orders for home oxygen and a rolling walker.  [facility] was informed that patient will be discharging to the [facility] address for home O2 equipment delivery.   10/05/13 [place], MSN, CM- Recieved call from [place] at [facility], who has verified that referral has been accepted and they will be able to see the patient tomorrow, if discharged today.  CM notified [name] that orders will be faxed [phone] as soon as they have been placed by the physician. Patient was updated.  10/05/13 [place], MSN, CM- Met with patient to discuss the need for a PCP for diabetic management and signing home health orders. Patient was given the [facility] number to assist with finding a PCP.  CM requested that patient call this morning to start the process.  Patient states that she will be returning home with her mother to [facility] for post-hospitalization recovery.  Patient's mother's address is: [address] [place], [name] [address]. Patient's contact number is her personal cell [phone].  CM spoke with [name] at [facility] regarding new referral.  Per [name], OT is not available in [facility].  CM is awaiting a phone call back regarding availability of PT/RN and ability to accept a referral.  CM spoke with Dr Algis Liming regarding the lack of OT and whether a HHRN would be appropriate.    10/04/13- 1200- Kristi Webster RN,BSN [phone] Spoke with pt and mom at bedside- confirmed that pt plans to go to [name] house in [place] upon discharge - address- [address]  [place], [place] [address]-- pt is agreeable to [facility] services- not sure she wants RW at discharge- list for [facility] agencies in [facility] given to pt and her mom to review- will f/u for agency of choice prior to discharge- MD please write for [facility] and RW prior to discharge.   10/03/13- 1445- Marvetta Gibbons RN, BSN [phone] Off vent/pressors- PT now recommending HH- plan is to stay with her mom in [place] for awhile to recover- will need HH order prior to discharge. NCM to follow to arrange [facility] when ordered.  09-19-13  11:30am [place] [id number] Remains intubated - off pressors.  Weaned insulin drip off this am.  09-26-13  11:50am [place] [id number] Intubated on 10-18 - ARDS - on pressors.

## 2013-10-10 LAB — GLUCOSE, CAPILLARY: Glucose-Capillary: 143 mg/dL — ABNORMAL HIGH (ref 70–99)

## 2013-10-14 ENCOUNTER — Ambulatory Visit (INDEPENDENT_AMBULATORY_CARE_PROVIDER_SITE_OTHER): Payer: Federal, State, Local not specified - PPO | Admitting: Internal Medicine

## 2013-10-14 VITALS — BP 126/80 | HR 99 | Ht 64.0 in | Wt 156.0 lb

## 2013-10-14 DIAGNOSIS — R0602 Shortness of breath: Secondary | ICD-10-CM

## 2013-10-14 LAB — BASIC METABOLIC PANEL
BUN: 12 mg/dL (ref 6–23)
CO2: 30 mEq/L (ref 19–32)
Calcium: 9.3 mg/dL (ref 8.4–10.5)
Chloride: 100 mEq/L (ref 96–112)
Creatinine, Ser: 0.9 mg/dL (ref 0.4–1.2)
GFR: 84.84 mL/min (ref >60.00)
Glucose, Bld: 231 mg/dL — ABNORMAL HIGH (ref 70–99)
Potassium: 4.2 mEq/L (ref 3.5–5.1)
Sodium: 135 mEq/L (ref 135–145)

## 2013-10-14 LAB — BRAIN NATRIURETIC PEPTIDE: Pro B Natriuretic peptide (BNP): 12 pg/mL (ref 0.0–100.0)

## 2013-10-14 NOTE — Patient Instructions (Signed)
Lab Work today   Your physician recommends that you schedule a follow-up appointment in: 3 months

## 2013-10-16 NOTE — Progress Notes (Signed)
HPI Patient is a 52 yo who was recently d/c'd from [facility].  She was admitted with DKA and sepsis  Found to have bilateral PE with a large clot burden  Echo showed LVEF approx 30%, RVEF moderately depressed.  Myoview showed no signif ischemia  LVEFF in 40s  Since d/c she has been doing OK  Breathing a little better.  No signif edema.     No Known Allergies  Current Outpatient Prescriptions  Medication Sig Dispense Refill  . lisinopril (PRINIVIL,ZESTRIL) 5 MG tablet Take 1 tablet (5 mg total) by mouth daily.  30 tablet  0  . ONE TOUCH ULTRA TEST test strip       . ONETOUCH DELICA LANCETS FINE MISC       . potassium chloride SA (K-DUR,KLOR-CON) 10 MEQ tablet Take 1 tablet (10 mEq total) by mouth daily.  30 tablet  0  . Rivaroxaban (XARELTO) 15 MG TABS tablet Take 1 tablet (15 mg total) by mouth 2 (two) times daily with a meal. After completing this prescription, start the once daily prescription.  38 tablet  0  . [START ON 10/29/2013] Rivaroxaban (XARELTO) 20 MG TABS tablet Take 1 tablet (20 mg total) by mouth daily with supper. First dose on Sat 10/29/13 at 1700  30 tablet  0  . sitaGLIPtin-metformin (JANUMET) 50-500 MG per tablet Take 1 tablet by mouth 2 (two) times daily with a meal.      . UNABLE TO FIND 1. Glucometer - 1 2. Glucometer strips - 1 box. 3. Lancets - 1 box  1 Units  0  . carvedilol (COREG) 25 MG tablet Take 1 tablet (25 mg total) by mouth 2 (two) times daily.  60 tablet  6  . furosemide (LASIX) 40 MG tablet Take 1 tablet (40 mg total) by mouth daily.  30 tablet  0   No current facility-administered medications for this visit.    Past Medical History  Diagnosis Date  . Diabetes   . 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  Family History  Problem Relation Age of Onset  . CAD Neg Hx     History   Social History  . Marital Status: Single    Spouse Name: N/A    Number of  Children: N/A  . Years of Education: N/A   Occupational History  . Not on file.   Social History Main Topics  . Smoking status: Never Smoker   . Smokeless tobacco: Not on file  . Alcohol Use: No  . Drug Use: No  . Sexual Activity: Not on file   Other Topics Concern  . Not on file   Social History Narrative  . No narrative on file    Review of Systems:  All systems reviewed.  They are negative to the above problem except as previously stated.  Vital Signs: BP 126/80  Pulse 99  Ht 5\' 4"  (1.626 m)  Wt 156 lb (70.761 kg)  BMI 26.76 kg/m2  LMP 10/04/2013  Physical Exam  HEENT:  Normocephalic, atraumatic. EOMI, PERRLA.  Neck: JVP is normal.  No bruits.  Lungs: clear to auscultation. No rales no wheezes.  Heart: Regular rate and rhythm. Normal S1, S2. No S3.   No significant murmurs. PMI not displaced.  Abdomen:  Supple, nontender. Normal bowel sounds. No masses. No hepatomegaly.  Extremities:   Good distal pulses throughout.  No lower extremity edema.  Musculoskeletal :moving all extremities.  Neuro:   alert and oriented x3.  CN II-XII grossly intact.   Assessment and Plan:  1.  Chronic systoic CHF  May be related to sepsis  Will get echo to reeval  Continue current meds  Volume status looks OK  Check labs.  2.  PE  Continue on Xarelto  3.  DM  Continue meds.

## 2013-10-18 ENCOUNTER — Telehealth: Payer: Self-pay | Admitting: Internal Medicine

## 2013-10-18 NOTE — Telephone Encounter (Signed)
Called patient with lab results.  

## 2013-10-18 NOTE — Telephone Encounter (Signed)
Follow Up: ° ° ° °Pt returning call for results.  °

## 2013-10-22 ENCOUNTER — Encounter (HOSPITAL_BASED_OUTPATIENT_CLINIC_OR_DEPARTMENT_OTHER): Payer: Self-pay | Admitting: Emergency Medicine

## 2013-10-22 ENCOUNTER — Emergency Department (HOSPITAL_BASED_OUTPATIENT_CLINIC_OR_DEPARTMENT_OTHER)
Admission: EM | Admit: 2013-10-22 | Discharge: 2013-10-22 | Disposition: A | Discharge status: Home / Self Care | Payer: Federal, State, Local not specified - PPO | Attending: Emergency Medicine | Admitting: Emergency Medicine

## 2013-10-22 ENCOUNTER — Ambulatory Visit (HOSPITAL_COMMUNITY)
Admission: RE | Admit: 2013-10-22 | Discharge: 2013-10-22 | Disposition: A | Discharge status: Home / Self Care | Payer: Federal, State, Local not specified - PPO | Source: Ambulatory Visit | Attending: Emergency Medicine | Admitting: Emergency Medicine

## 2013-10-22 DIAGNOSIS — M79609 Pain in unspecified limb: Secondary | ICD-10-CM | POA: Insufficient documentation

## 2013-10-22 DIAGNOSIS — Z87440 Personal history of urinary (tract) infections: Secondary | ICD-10-CM | POA: Insufficient documentation

## 2013-10-22 DIAGNOSIS — M79662 Pain in left lower leg: Secondary | ICD-10-CM

## 2013-10-22 DIAGNOSIS — Z8619 Personal history of other infectious and parasitic diseases: Secondary | ICD-10-CM | POA: Insufficient documentation

## 2013-10-22 DIAGNOSIS — Z86711 Personal history of pulmonary embolism: Secondary | ICD-10-CM | POA: Insufficient documentation

## 2013-10-22 DIAGNOSIS — Z86718 Personal history of other venous thrombosis and embolism: Secondary | ICD-10-CM | POA: Insufficient documentation

## 2013-10-22 DIAGNOSIS — Z79899 Other long term (current) drug therapy: Secondary | ICD-10-CM | POA: Insufficient documentation

## 2013-10-22 DIAGNOSIS — Z7901 Long term (current) use of anticoagulants: Secondary | ICD-10-CM | POA: Insufficient documentation

## 2013-10-22 DIAGNOSIS — E119 Type 2 diabetes mellitus without complications: Secondary | ICD-10-CM | POA: Insufficient documentation

## 2013-10-22 DIAGNOSIS — Z87768 Personal history of other specified (corrected) congenital malformations of integument, limbs and musculoskeletal system: Secondary | ICD-10-CM | POA: Insufficient documentation

## 2013-10-22 DIAGNOSIS — Z8776 Personal history of (corrected) congenital malformations of integument, limbs and musculoskeletal system: Secondary | ICD-10-CM | POA: Insufficient documentation

## 2013-10-22 HISTORY — DX: Unspecified Escherichia coli (E. coli) as the cause of diseases classified elsewhere: N39.0

## 2013-10-22 HISTORY — DX: Urinary tract infection, site not specified: B96.20

## 2013-10-22 HISTORY — DX: Other pulmonary embolism without acute cor pulmonale: I26.99

## 2013-10-22 HISTORY — DX: Acute embolism and thrombosis of unspecified deep veins of unspecified lower extremity: I82.409

## 2013-10-22 MED ORDER — HYDROCODONE-ACETAMINOPHEN 5-325 MG PO TABS: 1.0000 | ORAL_TABLET | Freq: Four times a day (QID) | ORAL | Status: DC | PRN

## 2013-10-22 NOTE — ED Notes (Signed)
C/o left calf pain onset last pm  Denies inj, no swelling or redness noted

## 2013-10-22 NOTE — Progress Notes (Signed)
VASCULAR LAB PRELIMINARY  PRELIMINARY  PRELIMINARY  PRELIMINARY  Left lower extremity venous Doppler completed.    Preliminary report:  There is no DVT or SVT noted in the left lower extremity.  Ladawn Boullion, RVT 10/22/2013, 5:13 PM

## 2013-10-22 NOTE — ED Provider Notes (Signed)
CSN: [id number]     Arrival date & time 10/22/13  0104 History   First MD Initiated Contact with Patient 10/22/13 0130     Chief Complaint  Patient presents with  . Leg Pain   (Consider location/radiation/quality/duration/timing/severity/associated sxs/prior Treatment) HPI 52 year old female who was recently admitted for urosepsis and hospital course was complicated by pulmonary embolism. She is here with pain in her left calf which began about 24 hours ago. At rest the pain is about a 3/10 but increases to a 5/10 when palpated or ambulating. She describes the pain as feeling like "a knot". There is no associated redness, warmth or swelling. She is having no chest pain or shortness of breath.  Past Medical History  Diagnosis Date  . Diabetes   . 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 E-coli UTI   . DVT (deep venous thrombosis)   . PE (pulmonary embolism)    History reviewed. No pertinent past surgical history. Family History  Problem Relation Age of Onset  . CAD Neg Hx    History  Substance Use Topics  . Smoking status: Never Smoker   . Smokeless tobacco: Never Used  . Alcohol Use: No   OB History   Grav Para Term Preterm Abortions TAB SAB Ect Mult Living                 Review of Systems  All other systems reviewed and are negative.    Allergies  Review of patient's allergies indicates no known allergies.  Home Medications   Current Outpatient Rx  Name  Route  Sig  Dispense  Refill  . carvedilol (COREG) 25 MG tablet   Oral   Take 1 tablet (25 mg total) by mouth 2 (two) times daily.   60 tablet   6   . furosemide (LASIX) 40 MG tablet   Oral   Take 1 tablet (40 mg total) by mouth daily.   30 tablet   0   . Iron 66 MG TABS   Oral   Take by mouth.         Marland Kitchen lisinopril (PRINIVIL,ZESTRIL) 5 MG tablet   Oral   Take 1 tablet (5 mg total) by mouth daily.   30 tablet   0   . ONE TOUCH ULTRA TEST test  strip               . ONETOUCH DELICA LANCETS FINE MISC               . potassium chloride SA (K-DUR,KLOR-CON) 10 MEQ tablet   Oral   Take 1 tablet (10 mEq total) by mouth daily.   30 tablet   0   . Rivaroxaban (XARELTO) 15 MG TABS tablet   Oral   Take 1 tablet (15 mg total) by mouth 2 (two) times daily with a meal. After completing this prescription, start the once daily prescription.   38 tablet   0   . Rivaroxaban (XARELTO) 20 MG TABS tablet   Oral   Take 1 tablet (20 mg total) by mouth daily with supper. First dose on Sat 10/29/13 at 1700   30 tablet   0   . sitaGLIPtin-metformin (JANUMET) 50-500 MG per tablet   Oral   Take 1 tablet by mouth 2 (two) times daily with a meal.         . UNABLE TO FIND  1. Glucometer - 1 2. Glucometer strips - 1 box. 3. Lancets - 1 box   1 Units   0    BP 111/63  Pulse 108  Temp(Src) 99.2 F (37.3 C) (Oral)  Resp 20  Ht 5\' 4"  (1.626 m)  Wt 149 lb (67.586 kg)  BMI 25.56 kg/m2  SpO2 96%  LMP 08/08/2013  Physical Exam General: Well-developed, well-nourished female in no acute distress; appearance consistent with age of record HENT: normocephalic; atraumatic Eyes: pupils equal, round and reactive to light; extraocular muscles intact Neck: supple Heart: regular rate and rhythm Lungs: clear to auscultation bilaterally Abdomen: soft; nondistended; nontender; no masses or hepatosplenomegaly; bowel sounds present Extremities: No deformity; full range of motion; pulses normal; tenderness left calf without palpable cord, swelling, erythema or warmth Neurologic: Awake, alert and oriented; motor function intact in all extremities and symmetric; right facial droop Skin: Warm and dry Psychiatric: Normal mood and affect    ED Course  Procedures (including critical care time)  MDM  Patient states she's been compliant with her Xarelto. We'll arrange for her to have a Doppler ultrasound of her left leg later  today.    Wynetta Fines, MD 10/22/13 [phone]

## 2013-10-22 NOTE — ED Notes (Signed)
Pt waiting for [name] to be scheduled for AM

## 2013-10-22 NOTE — ED Notes (Signed)
Pt c/o leg pain x 1 day- recent d/c from ICU for ecoli infection- denies injury- denies travel

## 2013-11-23 ENCOUNTER — Ambulatory Visit: Payer: Federal, State, Local not specified - PPO | Admitting: *Deleted

## 2013-12-05 ENCOUNTER — Other Ambulatory Visit (HOSPITAL_COMMUNITY): Payer: Self-pay | Admitting: Internal Medicine

## 2013-12-13 ENCOUNTER — Emergency Department (HOSPITAL_COMMUNITY): Payer: Federal, State, Local not specified - PPO

## 2013-12-13 ENCOUNTER — Encounter (HOSPITAL_COMMUNITY): Payer: Self-pay | Admitting: Emergency Medicine

## 2013-12-13 ENCOUNTER — Emergency Department (HOSPITAL_COMMUNITY)
Admission: EM | Admit: 2013-12-13 | Discharge: 2013-12-13 | Disposition: A | Discharge status: Home / Self Care | Payer: Federal, State, Local not specified - PPO | Attending: Emergency Medicine | Admitting: Emergency Medicine

## 2013-12-13 DIAGNOSIS — M79642 Pain in left hand: Secondary | ICD-10-CM

## 2013-12-13 DIAGNOSIS — Z86718 Personal history of other venous thrombosis and embolism: Secondary | ICD-10-CM | POA: Insufficient documentation

## 2013-12-13 DIAGNOSIS — Z87440 Personal history of urinary (tract) infections: Secondary | ICD-10-CM | POA: Insufficient documentation

## 2013-12-13 DIAGNOSIS — Z7901 Long term (current) use of anticoagulants: Secondary | ICD-10-CM | POA: Insufficient documentation

## 2013-12-13 DIAGNOSIS — Z8619 Personal history of other infectious and parasitic diseases: Secondary | ICD-10-CM | POA: Insufficient documentation

## 2013-12-13 DIAGNOSIS — Z79899 Other long term (current) drug therapy: Secondary | ICD-10-CM | POA: Insufficient documentation

## 2013-12-13 DIAGNOSIS — Z86711 Personal history of pulmonary embolism: Secondary | ICD-10-CM | POA: Insufficient documentation

## 2013-12-13 DIAGNOSIS — M25549 Pain in joints of unspecified hand: Secondary | ICD-10-CM | POA: Insufficient documentation

## 2013-12-13 DIAGNOSIS — E119 Type 2 diabetes mellitus without complications: Secondary | ICD-10-CM | POA: Insufficient documentation

## 2013-12-13 MED ORDER — NAPROXEN SODIUM 275 MG PO TABS: 275.0000 mg | ORAL_TABLET | Freq: Two times a day (BID) | ORAL | Status: DC

## 2013-12-13 NOTE — ED Provider Notes (Signed)
CSN: [id number]     Arrival date & time 12/13/13  1519 History  This chart was scribed for non-physician practitioner, Etta Quill, NP-C working with Blanchard Kelch, MD by Frederich Balding, ED scribe. This patient was seen in room A04C/A04C and the patient's care was started at 4:20 PM.   Chief Complaint  Patient presents with  . Hand Pain   The history is provided by the patient. No language interpreter was used.   HPI Comments: Colleen [name] is a 53 y.o. female who presents to the Emergency Department complaining of gradual onset, constant left hand pain that started several days ago. Pt states it feels like a knot in her hand. Denies injury. Extending her fingers worsens the pain. Pt has used ice with no relief. States she was in ICU 8 weeks ago due to sepsis and started on several new medications. Pt has an appointment with her PCP January 23 for her diabetes. Denies history of stroke.   Past Medical History  Diagnosis Date  . Diabetes   . 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 E-coli UTI   . DVT (deep venous thrombosis)   . PE (pulmonary embolism)    History reviewed. No pertinent past surgical history. Family History  Problem Relation Age of Onset  . CAD Neg Hx    History  Substance Use Topics  . Smoking status: Never Smoker   . Smokeless tobacco: Never Used  . Alcohol Use: No   OB History   Grav Para Term Preterm Abortions TAB SAB Ect Mult Living                 Review of Systems  Musculoskeletal: Positive for arthralgias.  All other systems reviewed and are negative.    Allergies  Review of patient's allergies indicates no known allergies.  Home Medications   Current Outpatient Rx  Name  Route  Sig  Dispense  Refill  . carvedilol (COREG) 25 MG tablet   Oral   Take 1 tablet (25 mg total) by mouth 2 (two) times daily.   60 tablet   6   . furosemide (LASIX) 40 MG tablet   Oral   Take 1 tablet (40  mg total) by mouth daily.   30 tablet   0   . HYDROcodone-acetaminophen (NORCO/VICODIN) 5-325 MG per tablet   Oral   Take 1-2 tablets by mouth every 6 (six) hours as needed for moderate pain.   20 tablet   0   . Iron 66 MG TABS   Oral   Take by mouth.         Marland Kitchen lisinopril (PRINIVIL,ZESTRIL) 5 MG tablet   Oral   Take 1 tablet (5 mg total) by mouth daily.   30 tablet   0   . ONE TOUCH ULTRA TEST test strip               . ONETOUCH DELICA LANCETS FINE MISC               . potassium chloride SA (K-DUR,KLOR-CON) 10 MEQ tablet   Oral   Take 1 tablet (10 mEq total) by mouth daily.   30 tablet   0   . Rivaroxaban (XARELTO) 15 MG TABS tablet   Oral   Take 1 tablet (15 mg total) by mouth 2 (two) times daily with a meal. After completing this prescription, start the once daily  prescription.   38 tablet   0   . Rivaroxaban (XARELTO) 20 MG TABS tablet   Oral   Take 1 tablet (20 mg total) by mouth daily with supper. First dose on Sat 10/29/13 at 1700   30 tablet   0   . sitaGLIPtin-metformin (JANUMET) 50-500 MG per tablet   Oral   Take 1 tablet by mouth 2 (two) times daily with a meal.         . UNABLE TO FIND      1. Glucometer - 1 2. Glucometer strips - 1 box. 3. Lancets - 1 box   1 Units   0    BP 129/78  Pulse 101  Temp(Src) 98.3 F (36.8 C) (Oral)  Resp 18  SpO2 100%  Physical Exam  Nursing note and vitals reviewed. Constitutional: She is oriented to person, place, and time. She appears well-developed and well-nourished. No distress.  HENT:  Head: Normocephalic and atraumatic.  Eyes: EOM are normal.  Neck: Neck supple. No tracheal deviation present.  Cardiovascular: Normal rate, regular rhythm and normal heart sounds.   Pulmonary/Chest: Effort normal and breath sounds normal. No respiratory distress. She has no wheezes. She has no rales.  Musculoskeletal: Normal range of motion.  Tenderness and mild swelling to palmar aspect of left hand. No  redness appreciated. Able to flex and extend hand without significant discomfort. Doubt tendon involvement.   Neurological: She is alert and oriented to person, place, and time.  Skin: Skin is warm and dry.  Psychiatric: She has a normal mood and affect. Her behavior is normal.    ED Course  Procedures (including critical care time)  DIAGNOSTIC STUDIES: Oxygen Saturation is 100% on RA, normal by my interpretation.    COORDINATION OF CARE: 4:25 PM-Discussed treatment plan which includes short course of anti-inflammatory with pt at bedside and pt agreed to plan. Advised pt to follow up with her PCP or return to the ED if she develops redness, warmth or worsening pain.   Labs Review Labs Reviewed - No data to display Imaging Review Dg Hand Complete Left  12/13/2013   CLINICAL DATA:  Left hand swelling.  Knot in center of left hand.  EXAM: LEFT HAND - COMPLETE 3+ VIEW  COMPARISON:  None.  FINDINGS: No fracture or dislocation.  No bony erosion or osseous abnormality noted.  If soft tissue abnormality remains of high clinical concern, MR imaging may prove helpful.  IMPRESSION: No osseous abnormality.  Please see above.   Electronically Signed   By: Chauncey Cruel M.D.   On: 12/13/2013 16:17    EKG Interpretation   None       MDM  Left hand pain, palmer surface, overlying 4th/5th metacarpal.  No bony injury noted on xray.  Area is not erythematous or hot to touch.  Doubt infectious process.  Will treat with short course of NSAID--no abnormality in last renal function study.  Patient to follow-up with PCP if no improvement.  Return precautions discussed.  I personally performed the services described in this documentation, which was scribed in my presence. The recorded information has been reviewed and is accurate.   Norman Herrlich, NP 12/13/13 [phone]

## 2013-12-13 NOTE — Discharge Instructions (Signed)
Pain of Unknown Etiology (Pain Without a Known Cause) You have come to your caregiver because of pain. Pain can occur in any part of the body. Often there is not a definite cause. If your laboratory (blood or urine) work was normal and x-rays or other studies were normal, your caregiver may treat you without knowing the cause of the pain. An example of this is the headache. Most headaches are diagnosed by taking a history. This means your caregiver asks you questions about your headaches. Your caregiver determines a treatment based on your answers. Usually testing done for headaches is normal. Often testing is not done unless there is no response to medications. Regardless of where your pain is located today, you can be given medications to make you comfortable. If no physical cause of pain can be found, most cases of pain will gradually leave as suddenly as they came.  If you have a painful condition and no reason can be found for the pain, It is importantthat you follow up with your caregiver. If the pain becomes worse or does not go away, it may be necessary to repeat tests and look further for a possible cause.  Only take over-the-counter or prescription medicines for pain, discomfort, or fever as directed by your caregiver.  For the protection of your privacy, test results can not be given over the phone. Make sure you receive the results of your test. Ask as to how these results are to be obtained if you have not been informed. It is your responsibility to obtain your test results.  You may continue all activities unless the activities cause more pain. When the pain lessens, it is important to gradually resume normal activities. Resume activities by beginning slowly and gradually increasing the intensity and duration of the activities or exercise. During periods of severe pain, bed-rest may be helpful. Lay or sit in any position that is comfortable.  Ice used for acute (sudden) conditions may be  effective. Use a large plastic bag filled with ice and wrapped in a towel. This may provide pain relief.  See your caregiver for continued problems. They can help or refer you for exercises or physical therapy if necessary. If you were given medications for your condition, do not drive, operate machinery or power tools, or sign legal documents for 24 hours. Do not drink alcohol, take sleeping pills, or take other medications that may interfere with treatment. See your caregiver immediately if you have pain that is becoming worse and not relieved by medications. Document Released: 08/19/2001 Document Revised: 02/16/2012 Document Reviewed: 11/24/2005 [facility] Patient Information 2014 [place].

## 2013-12-13 NOTE — ED Notes (Signed)
Pt reports being in ICU 8 weeks ago due to sepsis and started on several new meds. Having pain to left palm and "feels a knot in her hand." denies injury to hand, thinks possible reaction to meds. No acute distress noted at triage.

## 2013-12-14 NOTE — ED Provider Notes (Signed)
Medical screening examination/treatment/procedure(s) were performed by non-physician practitioner and as supervising physician I was immediately available for consultation/collaboration.  EKG Interpretation   None         Blanchard Kelch, MD 12/14/13 1507

## 2013-12-28 ENCOUNTER — Encounter: Payer: Federal, State, Local not specified - PPO | Attending: Pulmonary Disease | Admitting: *Deleted

## 2013-12-28 DIAGNOSIS — Z713 Dietary counseling and surveillance: Secondary | ICD-10-CM | POA: Insufficient documentation

## 2013-12-28 DIAGNOSIS — E1165 Type 2 diabetes mellitus with hyperglycemia: Secondary | ICD-10-CM

## 2013-12-28 DIAGNOSIS — IMO0002 Reserved for concepts with insufficient information to code with codable children: Secondary | ICD-10-CM

## 2013-12-28 DIAGNOSIS — IMO0001 Reserved for inherently not codable concepts without codable children: Secondary | ICD-10-CM | POA: Insufficient documentation

## 2013-12-28 NOTE — Progress Notes (Signed)
Appt start time: 1430 end time:  1600.   Assessment:  Patient was seen on  12/28/13 for individual diabetes education. 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 She was diagnosed  3-4 yrs ago with T2DM. She did not receive any formal training.  She was hospitalized 09/22/13 at [facility] with an admitting glucose of >800mg /dl. She was in ICU in multi organ failure on a ventilator. Prior to hospitalization she had discontinued testing her glucose and was not taking her Metformin. She is now seeing Dr. Natividad Brood for management. She is now testing glucose FBS and 5pm (befor dinner). She is here for DSME and will f/u with PCP 02/06/14. If her glucose in not within range she has been advised that her treatment plan may be insulin.  Current HbA1c: 13.4%  Preferred Learning Style:   Auditory  Learning Readiness:   Ready  MEDICATIONS: See List: Janument, Pioglitazone  DIETARY INTAKE: Usual physical activity: Fitness center at apartment complex 4-6 times per week for approx 60 minutes per visit.   Intervention:  Nutrition counseling provided.  Discussed diabetes disease process and treatment options.  Discussed physiology of diabetes and role of obesity on insulin resistance.  Encouraged moderate weight reduction to improve glucose levels.  Discussed role of medications and diet in glucose control  Provided education on macronutrients on glucose levels.  Provided education on carb counting, importance of regularly scheduled meals/snacks, and meal planning  Discussed effects of physical activity on glucose levels and long-term glucose control.  Recommended 150 minutes of physical activity/week.  Reviewed patient medications.  Discussed role of medication on blood glucose and possible side effects  Discussed blood glucose monitoring and interpretation.  Discussed recommended target ranges and individual ranges.    Described short-term complications: hyper- and hypo-glycemia.  Discussed  causes,symptoms, and treatment options.  Discussed prevention, detection, and treatment of long-term complications.  Discussed the role of prolonged elevated glucose levels on body systems.  Discussed role of stress on blood glucose levels and discussed strategies to manage psychosocial issues.  Discussed recommendations for long-term diabetes self-care.  Established checklist for medical, dental, and emotional self-care.  Plan:  Aim for 2-3 Carb Choices per meal (30-45 grams) +/- 1 either way  Aim for 0-1 Carbs per snack if hungry  Consider reading food labels for Total Carbohydrate and Fat Grams of foods Continue your activity level  Continue checking BG at alternate times per day as directed by MD  Continue taking medication as directed by MD Food Options: Snack: [employer] Special K Protein Cereal  Teaching Method Utilized:  [employer] Hands on  Handouts given during visit include: Living Well with Diabetes Carb Counting and Food Label handouts  Meal Plan Card  Plate Method  Barriers to learning/adherence to lifestyle change: None  Diabetes self-care support plan:   [facility] support group  Boy Friend  Demonstrated degree of understanding via:  Teach Back   Monitoring/Evaluation:  Dietary intake, exercise, test glucose, and body weight, return in 4 weeks

## 2013-12-28 NOTE — Patient Instructions (Signed)
Plan:  Aim for 2-3 Carb Choices per meal (30-45 grams) +/- 1 either way  Aim for 0-1 Carbs per snack if hungry  Consider reading food labels for Total Carbohydrate and Fat Grams of foods Continue your activity level  Continue checking BG at alternate times per day as directed by MD  Continue taking medication as directed by MD Food Options: Snack: [facility] [employer] Special K Protein Cereal

## 2014-01-25 ENCOUNTER — Ambulatory Visit: Payer: Federal, State, Local not specified - PPO | Admitting: *Deleted

## 2014-01-30 ENCOUNTER — Ambulatory Visit (INDEPENDENT_AMBULATORY_CARE_PROVIDER_SITE_OTHER): Payer: Federal, State, Local not specified - PPO | Admitting: Internal Medicine

## 2014-01-30 ENCOUNTER — Encounter: Payer: Self-pay | Admitting: Internal Medicine

## 2014-01-30 VITALS — BP 126/86 | HR 101 | Ht 64.0 in | Wt 156.8 lb

## 2014-01-30 DIAGNOSIS — I5023 Acute on chronic systolic (congestive) heart failure: Secondary | ICD-10-CM

## 2014-01-30 DIAGNOSIS — I428 Other cardiomyopathies: Secondary | ICD-10-CM

## 2014-01-30 DIAGNOSIS — I5031 Acute diastolic (congestive) heart failure: Secondary | ICD-10-CM

## 2014-01-30 LAB — CBC WITH DIFFERENTIAL/PLATELET
Basophils Absolute: 0 10*3/uL (ref 0.0–0.1)
Basophils Relative: 0.4 % (ref 0.0–3.0)
Eosinophils Absolute: 0.1 10*3/uL (ref 0.0–0.7)
Eosinophils Relative: 1 % (ref 0.0–5.0)
HCT: 38.8 % (ref 36.0–46.0)
Hemoglobin: 12.4 g/dL (ref 12.0–15.0)
Lymphocytes Relative: 23.9 % (ref 12.0–46.0)
Lymphs Abs: 2 10*3/uL (ref 0.7–4.0)
MCHC: 31.8 g/dL (ref 30.0–36.0)
MCV: 74.9 fl — ABNORMAL LOW (ref 78.0–100.0)
Monocytes Absolute: 0.6 10*3/uL (ref 0.1–1.0)
Monocytes Relative: 7.6 % (ref 3.0–12.0)
Neutro Abs: 5.7 10*3/uL (ref 1.4–7.7)
Neutrophils Relative %: 67.1 % (ref 43.0–77.0)
Platelets: 261 10*3/uL (ref 150.0–400.0)
RBC: 5.17 Mil/uL — ABNORMAL HIGH (ref 3.87–5.11)
RDW: 17.1 % — ABNORMAL HIGH (ref 11.5–14.6)
WBC: 8.5 10*3/uL (ref 4.5–10.5)

## 2014-01-30 LAB — BASIC METABOLIC PANEL
BUN: 33 mg/dL — ABNORMAL HIGH (ref 6–23)
CO2: 21 mEq/L (ref 19–32)
Calcium: 9.9 mg/dL (ref 8.4–10.5)
Chloride: 104 mEq/L (ref 96–112)
Creatinine, Ser: 1.2 mg/dL (ref 0.4–1.2)
GFR: 63.86 mL/min (ref >60.00)
Glucose, Bld: 210 mg/dL — ABNORMAL HIGH (ref 70–99)
Potassium: 4.3 mEq/L (ref 3.5–5.1)
Sodium: 133 mEq/L — ABNORMAL LOW (ref 135–145)

## 2014-01-30 NOTE — Progress Notes (Addendum)
HPI Patient is a 53 yo who I last saw in November.  She was admitted with DKA and sepsis  Found to have bilateral PE with a large clot burden  Echo showed LVEF approx 30%, RVEF moderately depressed.  Myoview showed no signif ischemia  LVEFF in 40s  Since I saw her in November she has been doing OK  Working on endurance  Working a few hours per day at [employer].  Denies CP  No dizziness.     No Known Allergies  Current Outpatient Prescriptions  Medication Sig Dispense Refill  . carvedilol (COREG) 25 MG tablet Take 1 tablet (25 mg total) by mouth 2 (two) times daily.  60 tablet  6  . Dapagliflozin Propanediol (FARXIGA) 10 MG TABS Take 10 mg by mouth daily.      . Iron 66 MG TABS Take by mouth.      Marland Kitchen KOMBIGLYZE XR 2.04-999 MG TB24 Take 1,000 mg by mouth daily.      Marland Kitchen lisinopril (PRINIVIL,ZESTRIL) 5 MG tablet Take 1 tablet (5 mg total) by mouth daily.  30 tablet  0  . ONE TOUCH ULTRA TEST test strip       . ONETOUCH DELICA LANCETS FINE MISC       . Rivaroxaban (XARELTO) 20 MG TABS tablet Take 1 tablet (20 mg total) by mouth daily with supper. First dose on Sat 10/29/13 at 1700  30 tablet  0  . UNABLE TO FIND 1. Glucometer - 1 2. Glucometer strips - 1 box. 3. Lancets - 1 box  1 Units  0   No current facility-administered medications for this visit.    Past Medical History  Diagnosis Date  . Diabetes   . 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 E-coli UTI   . DVT (deep venous thrombosis)   . PE (pulmonary embolism)     No past surgical history on file.  Family History  Problem Relation Age of Onset  . CAD Neg Hx     History   Social History  . Marital Status: Single    Spouse Name: N/A    Number of Children: N/A  . Years of Education: N/A   Occupational History  . Not on file.   Social History Main Topics  . Smoking status: Never Smoker   . Smokeless tobacco: Never Used  . Alcohol Use: No  . Drug Use: No  .  Sexual Activity: Not on file   Other Topics Concern  . Not on file   Social History Narrative  . No narrative on file    Review of Systems:  All systems reviewed.  They are negative to the above problem except as previously stated.  Vital Signs: BP 126/86  Pulse 101  Ht 5\' 4"  (1.626 m)  Wt 156 lb 12.8 oz (71.124 kg)  BMI 26.90 kg/m2  Physical Exam  HEENT:  Normocephalic, atraumatic. EOMI, PERRLA.  Neck: JVP is normal.  No bruits.  Lungs: clear to auscultation. No rales no wheezes.  Heart: Regular rate and rhythm. Normal S1, S2. No S3.   No significant murmurs. PMI not displaced.  Abdomen:  Supple, nontender. Normal bowel sounds. No masses. No hepatomegaly.  Extremities:   Good distal pulses throughout. No lower extremity edema.  Musculoskeletal :moving all extremities.  Neuro:   alert and oriented x3.  CN II-XII grossly intact.  EKG  ST  101  Poor  R wave progression.   Assessment and Plan:  1.  Chronic systoic CHF  May be related to sepsis  Will get echo to reeval  Continue current meds  Volume status looks OK  Check labs.  2.  PE  Continue on Xarelto  Currently is about 4 months from D/c  Need to review for length of care.    3.  DM  Continue meds.  F/U by primary MD

## 2014-01-30 NOTE — Patient Instructions (Signed)
Your physician recommends that you continue on your current medications as directed. Please refer to the Current Medication list given to you today.  Lab Today; [place], [place] physician has requested that you have an echocardiogram. Echocardiography is a painless test that uses sound waves to create images of your heart. It provides your doctor with information about the size and shape of your heart and how well your heart's chambers and valves are working. This procedure takes approximately one hour. There are no restrictions for this procedure.   Your physician wants you to follow-up in: 4-5 months You will receive a reminder letter in the mail two months in advance. If you don't receive a letter, please call our office to schedule the follow-up appointment.

## 2014-02-08 ENCOUNTER — Ambulatory Visit: Payer: Federal, State, Local not specified - PPO | Admitting: *Deleted

## 2014-02-15 ENCOUNTER — Other Ambulatory Visit (HOSPITAL_COMMUNITY): Payer: Federal, State, Local not specified - PPO

## 2014-03-03 ENCOUNTER — Other Ambulatory Visit (HOSPITAL_COMMUNITY): Payer: Federal, State, Local not specified - PPO

## 2014-06-04 NOTE — Progress Notes (Signed)
HPI Patient is a 53 yo who I last saw in November.  She was admitted with DKA and sepsis  Found to have bilateral PE with a large clot burden  Echo showed LVEF approx 30%, RVEF moderately depressed.  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  Denies CP  No dizziness.    The patient was last in clinic in February.   No Known Allergies  Current Outpatient Prescriptions  Medication Sig Dispense Refill  . carvedilol (COREG) 25 MG tablet Take 1 tablet (25 mg total) by mouth 2 (two) times daily.  60 tablet  6  . Dapagliflozin Propanediol (FARXIGA) 10 MG TABS Take 10 mg by mouth daily.      . Iron 66 MG TABS Take by mouth.      Marland Kitchen KOMBIGLYZE XR 2.04-999 MG TB24 Take 1,000 mg by mouth daily.      Marland Kitchen lisinopril (PRINIVIL,ZESTRIL) 5 MG tablet Take 1 tablet (5 mg total) by mouth daily.  30 tablet  0  . ONE TOUCH ULTRA TEST test strip       . ONETOUCH DELICA LANCETS FINE MISC       . Rivaroxaban (XARELTO) 20 MG TABS tablet Take 1 tablet (20 mg total) by mouth daily with supper. First dose on Sat 10/29/13 at 1700  30 tablet  0  . UNABLE TO FIND 1. Glucometer - 1 2. Glucometer strips - 1 box. 3. Lancets - 1 box  1 Units  0   No current facility-administered medications for this visit.    Past Medical History  Diagnosis Date  . Diabetes   . 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 E-coli UTI   . DVT (deep venous thrombosis)   . PE (pulmonary embolism)     No past surgical history on file.  Family History  Problem Relation Age of Onset  . CAD Neg Hx     History   Social History  . Marital Status: Single    Spouse Name: N/A    Number of Children: N/A  . Years of Education: N/A   Occupational History  . Not on file.   Social History Main Topics  . Smoking status: Never Smoker   . Smokeless tobacco: Never Used   . Alcohol Use: No  . Drug Use: No  . Sexual Activity: Not on file   Other Topics Concern  . Not on file   Social History Narrative  . No narrative on file    Review of Systems:  All systems reviewed.  They are negative to the above problem except as previously stated.  Vital Signs: There were no vitals taken for this visit.  Physical Exam  HEENT:  Normocephalic, atraumatic. EOMI, PERRLA.  Neck: JVP is normal.  No bruits.  Lungs: clear to auscultation. No rales no wheezes.  Heart: Regular rate and rhythm. Normal S1, S2. No S3.   No significant murmurs. PMI not displaced.  Abdomen:  Supple, nontender. Normal bowel sounds. No masses. No hepatomegaly.  Extremities:   Good distal pulses throughout. No lower extremity edema.  Musculoskeletal :moving all extremities.  Neuro:   alert and oriented x3.  CN II-XII grossly intact.  EKG  ST  101  Poor R wave progression.   Assessment  and Plan:  1.  Chronic systoic CHF  May be related to sepsis  Will get echo to reeval  Continue current meds  Volume status looks OK  Check labs.  2.  PE  Continue on Xarelto  Currently is about 4 months from D/c  Need to review for length of care.    3.  DM  Continue meds.  F/U by primary MD  This encounter was created in error - please disregard. This encounter was created in error - please disregard. This encounter was created in error - please disregard. This encounter was created in error - please disregard.

## 2014-06-05 ENCOUNTER — Encounter: Payer: Federal, State, Local not specified - PPO | Admitting: Internal Medicine

## 2014-06-15 ENCOUNTER — Encounter: Payer: Self-pay | Admitting: Internal Medicine

## 2015-10-01 DIAGNOSIS — R4020 Unspecified coma: Secondary | ICD-10-CM | POA: Insufficient documentation

## 2015-10-10 DIAGNOSIS — L03011 Cellulitis of right finger: Secondary | ICD-10-CM | POA: Insufficient documentation

## 2015-12-11 DIAGNOSIS — L84 Corns and callosities: Secondary | ICD-10-CM | POA: Insufficient documentation

## 2016-04-03 DIAGNOSIS — I10 Essential (primary) hypertension: Secondary | ICD-10-CM | POA: Diagnosis not present

## 2016-04-03 DIAGNOSIS — E1165 Type 2 diabetes mellitus with hyperglycemia: Secondary | ICD-10-CM | POA: Diagnosis not present

## 2016-04-14 DIAGNOSIS — E113591 Type 2 diabetes mellitus with proliferative diabetic retinopathy without macular edema, right eye: Secondary | ICD-10-CM | POA: Diagnosis not present

## 2016-04-14 DIAGNOSIS — H2511 Age-related nuclear cataract, right eye: Secondary | ICD-10-CM | POA: Diagnosis not present

## 2016-04-14 DIAGNOSIS — H2513 Age-related nuclear cataract, bilateral: Secondary | ICD-10-CM | POA: Diagnosis not present

## 2016-04-14 DIAGNOSIS — H4311 Vitreous hemorrhage, right eye: Secondary | ICD-10-CM | POA: Diagnosis not present

## 2016-04-14 DIAGNOSIS — E113592 Type 2 diabetes mellitus with proliferative diabetic retinopathy without macular edema, left eye: Secondary | ICD-10-CM | POA: Diagnosis not present

## 2016-04-14 DIAGNOSIS — H3561 Retinal hemorrhage, right eye: Secondary | ICD-10-CM | POA: Diagnosis not present

## 2016-04-15 DIAGNOSIS — E113591 Type 2 diabetes mellitus with proliferative diabetic retinopathy without macular edema, right eye: Secondary | ICD-10-CM | POA: Diagnosis not present

## 2016-04-16 DIAGNOSIS — E113592 Type 2 diabetes mellitus with proliferative diabetic retinopathy without macular edema, left eye: Secondary | ICD-10-CM | POA: Diagnosis not present

## 2016-04-23 DIAGNOSIS — H3561 Retinal hemorrhage, right eye: Secondary | ICD-10-CM | POA: Diagnosis not present

## 2016-04-23 DIAGNOSIS — E113591 Type 2 diabetes mellitus with proliferative diabetic retinopathy without macular edema, right eye: Secondary | ICD-10-CM | POA: Diagnosis not present

## 2016-04-23 DIAGNOSIS — H4311 Vitreous hemorrhage, right eye: Secondary | ICD-10-CM | POA: Diagnosis not present

## 2016-04-23 DIAGNOSIS — H3341 Traction detachment of retina, right eye: Secondary | ICD-10-CM | POA: Diagnosis not present

## 2016-05-07 DIAGNOSIS — I10 Essential (primary) hypertension: Secondary | ICD-10-CM | POA: Diagnosis not present

## 2016-05-07 DIAGNOSIS — E1165 Type 2 diabetes mellitus with hyperglycemia: Secondary | ICD-10-CM | POA: Diagnosis not present

## 2016-05-07 DIAGNOSIS — E860 Dehydration: Secondary | ICD-10-CM | POA: Diagnosis not present

## 2016-06-05 DIAGNOSIS — N183 Chronic kidney disease, stage 3 (moderate): Secondary | ICD-10-CM | POA: Diagnosis not present

## 2016-06-05 DIAGNOSIS — I10 Essential (primary) hypertension: Secondary | ICD-10-CM | POA: Diagnosis not present

## 2016-06-05 DIAGNOSIS — E7800 Pure hypercholesterolemia, unspecified: Secondary | ICD-10-CM | POA: Diagnosis not present

## 2016-06-05 DIAGNOSIS — E1165 Type 2 diabetes mellitus with hyperglycemia: Secondary | ICD-10-CM | POA: Diagnosis not present

## 2016-06-09 DIAGNOSIS — N183 Chronic kidney disease, stage 3 (moderate): Secondary | ICD-10-CM | POA: Diagnosis not present

## 2016-07-21 DIAGNOSIS — E1165 Type 2 diabetes mellitus with hyperglycemia: Secondary | ICD-10-CM | POA: Diagnosis not present

## 2016-07-21 DIAGNOSIS — Z23 Encounter for immunization: Secondary | ICD-10-CM | POA: Diagnosis not present

## 2016-07-21 DIAGNOSIS — H6120 Impacted cerumen, unspecified ear: Secondary | ICD-10-CM | POA: Diagnosis not present

## 2016-07-21 DIAGNOSIS — I10 Essential (primary) hypertension: Secondary | ICD-10-CM | POA: Diagnosis not present

## 2016-08-06 DIAGNOSIS — E1165 Type 2 diabetes mellitus with hyperglycemia: Secondary | ICD-10-CM | POA: Diagnosis not present

## 2016-08-06 DIAGNOSIS — I10 Essential (primary) hypertension: Secondary | ICD-10-CM | POA: Diagnosis not present

## 2017-07-08 DIAGNOSIS — N183 Chronic kidney disease, stage 3 (moderate): Secondary | ICD-10-CM | POA: Diagnosis not present

## 2017-07-08 DIAGNOSIS — E7800 Pure hypercholesterolemia, unspecified: Secondary | ICD-10-CM | POA: Diagnosis not present

## 2017-07-08 DIAGNOSIS — E1122 Type 2 diabetes mellitus with diabetic chronic kidney disease: Secondary | ICD-10-CM | POA: Diagnosis not present

## 2017-07-08 DIAGNOSIS — I129 Hypertensive chronic kidney disease with stage 1 through stage 4 chronic kidney disease, or unspecified chronic kidney disease: Secondary | ICD-10-CM | POA: Diagnosis not present

## 2017-07-16 DIAGNOSIS — N184 Chronic kidney disease, stage 4 (severe): Secondary | ICD-10-CM | POA: Diagnosis not present

## 2017-07-16 DIAGNOSIS — N059 Unspecified nephritic syndrome with unspecified morphologic changes: Secondary | ICD-10-CM | POA: Diagnosis not present

## 2017-07-16 DIAGNOSIS — I129 Hypertensive chronic kidney disease with stage 1 through stage 4 chronic kidney disease, or unspecified chronic kidney disease: Secondary | ICD-10-CM | POA: Diagnosis not present

## 2017-07-16 DIAGNOSIS — E1129 Type 2 diabetes mellitus with other diabetic kidney complication: Secondary | ICD-10-CM | POA: Diagnosis not present

## 2017-07-16 DIAGNOSIS — N179 Acute kidney failure, unspecified: Secondary | ICD-10-CM | POA: Diagnosis not present

## 2017-07-16 DIAGNOSIS — R809 Proteinuria, unspecified: Secondary | ICD-10-CM | POA: Diagnosis not present

## 2017-07-20 ENCOUNTER — Ambulatory Visit: Payer: Federal, State, Local not specified - PPO | Admitting: Dietician

## 2017-07-20 ENCOUNTER — Other Ambulatory Visit: Payer: Self-pay | Admitting: Nephrology

## 2017-07-20 DIAGNOSIS — I10 Essential (primary) hypertension: Secondary | ICD-10-CM

## 2017-07-20 DIAGNOSIS — N179 Acute kidney failure, unspecified: Secondary | ICD-10-CM

## 2017-07-23 ENCOUNTER — Ambulatory Visit
Admission: RE | Admit: 2017-07-23 | Discharge: 2017-07-23 | Disposition: A | Discharge status: Home / Self Care | Payer: Federal, State, Local not specified - PPO | Source: Ambulatory Visit | Attending: Nephrology | Admitting: Nephrology

## 2017-07-23 DIAGNOSIS — N179 Acute kidney failure, unspecified: Secondary | ICD-10-CM

## 2017-07-23 DIAGNOSIS — I10 Essential (primary) hypertension: Secondary | ICD-10-CM

## 2017-07-23 DIAGNOSIS — N189 Chronic kidney disease, unspecified: Secondary | ICD-10-CM | POA: Diagnosis not present

## 2017-08-03 ENCOUNTER — Other Ambulatory Visit (HOSPITAL_COMMUNITY): Payer: Self-pay | Admitting: Nephrology

## 2017-08-12 DIAGNOSIS — I129 Hypertensive chronic kidney disease with stage 1 through stage 4 chronic kidney disease, or unspecified chronic kidney disease: Secondary | ICD-10-CM | POA: Diagnosis not present

## 2017-08-12 DIAGNOSIS — N184 Chronic kidney disease, stage 4 (severe): Secondary | ICD-10-CM | POA: Diagnosis not present

## 2017-08-12 DIAGNOSIS — E1122 Type 2 diabetes mellitus with diabetic chronic kidney disease: Secondary | ICD-10-CM | POA: Diagnosis not present

## 2017-08-12 DIAGNOSIS — E7800 Pure hypercholesterolemia, unspecified: Secondary | ICD-10-CM | POA: Diagnosis not present

## 2017-08-14 ENCOUNTER — Other Ambulatory Visit: Payer: Self-pay | Admitting: Family Medicine

## 2017-08-14 ENCOUNTER — Telehealth (HOSPITAL_COMMUNITY): Payer: Self-pay | Admitting: Family Medicine

## 2017-08-14 DIAGNOSIS — M7989 Other specified soft tissue disorders: Secondary | ICD-10-CM

## 2017-08-14 DIAGNOSIS — I420 Dilated cardiomyopathy: Secondary | ICD-10-CM

## 2017-08-19 NOTE — Telephone Encounter (Signed)
User: Cherie Dark A Date/time: 08/14/17 12:37 PM  Comment: Returned patient call and lmsg for her to CB to sch echo.   Context:  Outcome: Left Message  Phone number: [phone] Phone Type: Home Phone  Comm. type: Telephone Call type: Outgoing  Contact: Vint, Verdene Lennert D Relation to patient: Self    User: Cherie Dark A Date/time: 08/14/17 10:54 AM  Comment: Called pt and lmsg for her to Cb to sch echo.   Context:  Outcome: Left Message  Phone number: [phone] Phone Type: Home Phone  Comm. type: Telephone Call type: Outgoing  Contact: Wolin, Verdene Lennert D Relation to patient: Self

## 2017-08-21 ENCOUNTER — Other Ambulatory Visit (HOSPITAL_COMMUNITY): Payer: Federal, State, Local not specified - PPO

## 2017-10-19 ENCOUNTER — Ambulatory Visit: Payer: Federal, State, Local not specified - PPO | Admitting: Registered"

## 2017-12-10 DIAGNOSIS — E1165 Type 2 diabetes mellitus with hyperglycemia: Secondary | ICD-10-CM | POA: Diagnosis not present

## 2017-12-10 DIAGNOSIS — R600 Localized edema: Secondary | ICD-10-CM | POA: Diagnosis not present

## 2017-12-18 DIAGNOSIS — N184 Chronic kidney disease, stage 4 (severe): Secondary | ICD-10-CM | POA: Diagnosis not present

## 2017-12-18 DIAGNOSIS — E7800 Pure hypercholesterolemia, unspecified: Secondary | ICD-10-CM | POA: Diagnosis not present

## 2017-12-18 DIAGNOSIS — E1122 Type 2 diabetes mellitus with diabetic chronic kidney disease: Secondary | ICD-10-CM | POA: Diagnosis not present

## 2017-12-18 DIAGNOSIS — E1165 Type 2 diabetes mellitus with hyperglycemia: Secondary | ICD-10-CM | POA: Diagnosis not present

## 2017-12-18 DIAGNOSIS — M79605 Pain in left leg: Secondary | ICD-10-CM | POA: Diagnosis not present

## 2017-12-18 DIAGNOSIS — I10 Essential (primary) hypertension: Secondary | ICD-10-CM | POA: Diagnosis not present

## 2017-12-18 DIAGNOSIS — M79604 Pain in right leg: Secondary | ICD-10-CM | POA: Diagnosis not present

## 2017-12-29 ENCOUNTER — Telehealth (HOSPITAL_COMMUNITY): Payer: Self-pay | Admitting: Family Medicine

## 2017-12-29 NOTE — Telephone Encounter (Signed)
Wrong provider associated.Marland Kitchen

## 2017-12-30 ENCOUNTER — Other Ambulatory Visit: Payer: Self-pay | Admitting: Family Medicine

## 2017-12-30 DIAGNOSIS — I420 Dilated cardiomyopathy: Secondary | ICD-10-CM

## 2017-12-30 NOTE — Telephone Encounter (Signed)
User: Cherie Dark A Date/time: 12/29/17 2:11 PM  Comment: Called pt and lmsg for her to Cb to get sch for echo.RG  Context:  Outcome: Left Message  Phone number: [phone] Phone Type: Home Phone  Comm. type: Telephone Call type: Outgoing  Contact: Mcburney, Verdene Lennert D Relation to patient: Self

## 2018-01-01 ENCOUNTER — Telehealth (HOSPITAL_COMMUNITY): Payer: Self-pay | Admitting: Family Medicine

## 2018-01-04 ENCOUNTER — Other Ambulatory Visit (HOSPITAL_COMMUNITY): Payer: Federal, State, Local not specified - PPO

## 2018-01-04 NOTE — Telephone Encounter (Signed)
User: Cherie Dark A Date/time: 12/29/17 2:11 PM  Comment: Called pt and lmsg for her to Cb to get sch for echo.RG  Context:  Outcome: Left Message  Phone number: [phone] Phone Type: Home Phone  Comm. type: Telephone Call type: Outgoing  Contact: Reliford, Verdene Lennert D Relation to patient: Self   User: Cherie Dark A Date/time: 01/01/18 3:13 PM  Comment: Called pt and lmsg for her to CB to r/s appt   Context:  Outcome: Left Message  Phone number: [phone] Phone Type: Home Phone  Comm. type: Telephone Call type: Outgoing  Contact: Foulk, Verdene Lennert D Relation to patient: Self

## 2018-01-07 ENCOUNTER — Other Ambulatory Visit (HOSPITAL_COMMUNITY): Payer: Federal, State, Local not specified - PPO

## 2018-01-25 ENCOUNTER — Ambulatory Visit (HOSPITAL_COMMUNITY): Payer: Federal, State, Local not specified - PPO | Attending: Family Medicine

## 2018-01-25 ENCOUNTER — Other Ambulatory Visit: Payer: Self-pay

## 2018-01-25 DIAGNOSIS — E119 Type 2 diabetes mellitus without complications: Secondary | ICD-10-CM | POA: Insufficient documentation

## 2018-01-25 DIAGNOSIS — E785 Hyperlipidemia, unspecified: Secondary | ICD-10-CM | POA: Insufficient documentation

## 2018-01-25 DIAGNOSIS — I509 Heart failure, unspecified: Secondary | ICD-10-CM | POA: Diagnosis not present

## 2018-01-25 DIAGNOSIS — I313 Pericardial effusion (noninflammatory): Secondary | ICD-10-CM | POA: Diagnosis not present

## 2018-01-25 DIAGNOSIS — Z86718 Personal history of other venous thrombosis and embolism: Secondary | ICD-10-CM | POA: Diagnosis not present

## 2018-01-25 DIAGNOSIS — I110 Hypertensive heart disease with heart failure: Secondary | ICD-10-CM | POA: Insufficient documentation

## 2018-01-25 DIAGNOSIS — R6521 Severe sepsis with septic shock: Secondary | ICD-10-CM | POA: Diagnosis not present

## 2018-01-25 DIAGNOSIS — I420 Dilated cardiomyopathy: Secondary | ICD-10-CM | POA: Diagnosis not present

## 2018-01-28 DIAGNOSIS — M79604 Pain in right leg: Secondary | ICD-10-CM | POA: Diagnosis not present

## 2018-01-28 DIAGNOSIS — M79605 Pain in left leg: Secondary | ICD-10-CM | POA: Diagnosis not present

## 2018-01-28 DIAGNOSIS — M7989 Other specified soft tissue disorders: Secondary | ICD-10-CM | POA: Diagnosis not present

## 2018-02-01 DIAGNOSIS — M79606 Pain in leg, unspecified: Secondary | ICD-10-CM | POA: Diagnosis not present

## 2018-02-01 DIAGNOSIS — M25572 Pain in left ankle and joints of left foot: Secondary | ICD-10-CM | POA: Diagnosis not present

## 2018-02-01 DIAGNOSIS — M7989 Other specified soft tissue disorders: Secondary | ICD-10-CM | POA: Diagnosis not present

## 2018-02-01 DIAGNOSIS — M79672 Pain in left foot: Secondary | ICD-10-CM | POA: Diagnosis not present

## 2018-02-01 DIAGNOSIS — M25562 Pain in left knee: Secondary | ICD-10-CM | POA: Diagnosis not present

## 2018-02-01 DIAGNOSIS — M359 Systemic involvement of connective tissue, unspecified: Secondary | ICD-10-CM | POA: Diagnosis not present

## 2018-02-01 DIAGNOSIS — M1990 Unspecified osteoarthritis, unspecified site: Secondary | ICD-10-CM | POA: Diagnosis not present

## 2018-02-01 DIAGNOSIS — M25561 Pain in right knee: Secondary | ICD-10-CM | POA: Diagnosis not present

## 2018-02-01 DIAGNOSIS — M25571 Pain in right ankle and joints of right foot: Secondary | ICD-10-CM | POA: Diagnosis not present

## 2018-02-01 DIAGNOSIS — M79671 Pain in right foot: Secondary | ICD-10-CM | POA: Diagnosis not present

## 2018-02-08 DIAGNOSIS — I129 Hypertensive chronic kidney disease with stage 1 through stage 4 chronic kidney disease, or unspecified chronic kidney disease: Secondary | ICD-10-CM | POA: Diagnosis not present

## 2018-02-08 DIAGNOSIS — N179 Acute kidney failure, unspecified: Secondary | ICD-10-CM | POA: Diagnosis not present

## 2018-02-08 DIAGNOSIS — N184 Chronic kidney disease, stage 4 (severe): Secondary | ICD-10-CM | POA: Diagnosis not present

## 2018-02-08 DIAGNOSIS — R809 Proteinuria, unspecified: Secondary | ICD-10-CM | POA: Diagnosis not present

## 2018-02-08 DIAGNOSIS — N2581 Secondary hyperparathyroidism of renal origin: Secondary | ICD-10-CM | POA: Diagnosis not present

## 2018-02-15 DIAGNOSIS — R809 Proteinuria, unspecified: Secondary | ICD-10-CM | POA: Diagnosis not present

## 2018-05-27 ENCOUNTER — Encounter (HOSPITAL_COMMUNITY): Payer: Self-pay

## 2018-05-27 ENCOUNTER — Other Ambulatory Visit: Payer: Self-pay

## 2018-05-27 ENCOUNTER — Observation Stay (HOSPITAL_COMMUNITY)
Admission: EM | Admit: 2018-05-27 | Discharge: 2018-05-30 | Disposition: A | Discharge status: Home / Self Care | Payer: Federal, State, Local not specified - PPO | Attending: Family Medicine | Admitting: Family Medicine

## 2018-05-27 DIAGNOSIS — M25462 Effusion, left knee: Secondary | ICD-10-CM | POA: Diagnosis not present

## 2018-05-27 DIAGNOSIS — Z86718 Personal history of other venous thrombosis and embolism: Secondary | ICD-10-CM | POA: Diagnosis not present

## 2018-05-27 DIAGNOSIS — Z79899 Other long term (current) drug therapy: Secondary | ICD-10-CM | POA: Insufficient documentation

## 2018-05-27 DIAGNOSIS — R0602 Shortness of breath: Secondary | ICD-10-CM | POA: Diagnosis not present

## 2018-05-27 DIAGNOSIS — Z87440 Personal history of urinary (tract) infections: Secondary | ICD-10-CM | POA: Insufficient documentation

## 2018-05-27 DIAGNOSIS — N179 Acute kidney failure, unspecified: Secondary | ICD-10-CM | POA: Diagnosis not present

## 2018-05-27 DIAGNOSIS — I5023 Acute on chronic systolic (congestive) heart failure: Secondary | ICD-10-CM | POA: Diagnosis not present

## 2018-05-27 DIAGNOSIS — M7989 Other specified soft tissue disorders: Secondary | ICD-10-CM | POA: Diagnosis present

## 2018-05-27 DIAGNOSIS — E1165 Type 2 diabetes mellitus with hyperglycemia: Secondary | ICD-10-CM | POA: Diagnosis not present

## 2018-05-27 DIAGNOSIS — E1122 Type 2 diabetes mellitus with diabetic chronic kidney disease: Secondary | ICD-10-CM | POA: Diagnosis not present

## 2018-05-27 DIAGNOSIS — N189 Chronic kidney disease, unspecified: Secondary | ICD-10-CM

## 2018-05-27 DIAGNOSIS — N184 Chronic kidney disease, stage 4 (severe): Secondary | ICD-10-CM | POA: Diagnosis not present

## 2018-05-27 DIAGNOSIS — Z794 Long term (current) use of insulin: Secondary | ICD-10-CM | POA: Insufficient documentation

## 2018-05-27 DIAGNOSIS — I130 Hypertensive heart and chronic kidney disease with heart failure and stage 1 through stage 4 chronic kidney disease, or unspecified chronic kidney disease: Principal | ICD-10-CM | POA: Insufficient documentation

## 2018-05-27 DIAGNOSIS — D631 Anemia in chronic kidney disease: Secondary | ICD-10-CM | POA: Insufficient documentation

## 2018-05-27 DIAGNOSIS — M25562 Pain in left knee: Secondary | ICD-10-CM | POA: Diagnosis not present

## 2018-05-27 DIAGNOSIS — I129 Hypertensive chronic kidney disease with stage 1 through stage 4 chronic kidney disease, or unspecified chronic kidney disease: Secondary | ICD-10-CM | POA: Diagnosis not present

## 2018-05-27 DIAGNOSIS — IMO0002 Reserved for concepts with insufficient information to code with codable children: Secondary | ICD-10-CM | POA: Diagnosis present

## 2018-05-27 DIAGNOSIS — N185 Chronic kidney disease, stage 5: Secondary | ICD-10-CM | POA: Diagnosis present

## 2018-05-27 DIAGNOSIS — Z9889 Other specified postprocedural states: Secondary | ICD-10-CM | POA: Insufficient documentation

## 2018-05-27 DIAGNOSIS — Z7901 Long term (current) use of anticoagulants: Secondary | ICD-10-CM | POA: Diagnosis not present

## 2018-05-27 DIAGNOSIS — R9431 Abnormal electrocardiogram [ECG] [EKG]: Secondary | ICD-10-CM | POA: Diagnosis not present

## 2018-05-27 DIAGNOSIS — R52 Pain, unspecified: Secondary | ICD-10-CM

## 2018-05-27 DIAGNOSIS — I10 Essential (primary) hypertension: Secondary | ICD-10-CM | POA: Diagnosis present

## 2018-05-27 DIAGNOSIS — R600 Localized edema: Secondary | ICD-10-CM | POA: Diagnosis not present

## 2018-05-27 DIAGNOSIS — D509 Iron deficiency anemia, unspecified: Secondary | ICD-10-CM | POA: Diagnosis present

## 2018-05-27 DIAGNOSIS — M1712 Unilateral primary osteoarthritis, left knee: Secondary | ICD-10-CM | POA: Insufficient documentation

## 2018-05-27 DIAGNOSIS — Z86711 Personal history of pulmonary embolism: Secondary | ICD-10-CM | POA: Diagnosis not present

## 2018-05-27 HISTORY — DX: Chronic kidney disease, unspecified: N18.9

## 2018-05-27 LAB — URINALYSIS, ROUTINE W REFLEX MICROSCOPIC
Bilirubin Urine: NEGATIVE
Glucose, UA: >=500 mg/dL — AB
Ketones, ur: NEGATIVE mg/dL
Nitrite: NEGATIVE
Protein, ur: >=300 mg/dL — AB
Specific Gravity, Urine: 1.011 (ref 1.005–1.030)
pH: 6 (ref 5.0–8.0)

## 2018-05-27 LAB — CBG MONITORING, ED: Glucose-Capillary: 139 mg/dL — ABNORMAL HIGH (ref 65–99)

## 2018-05-27 LAB — BASIC METABOLIC PANEL
Anion gap: 8 (ref 5–15)
BUN: 39 mg/dL — ABNORMAL HIGH (ref 6–20)
CO2: 20 mmol/L — ABNORMAL LOW (ref 22–32)
Calcium: 9.2 mg/dL (ref 8.9–10.3)
Chloride: 112 mmol/L — ABNORMAL HIGH (ref 101–111)
Creatinine, Ser: 4.1 mg/dL — ABNORMAL HIGH (ref 0.44–1.00)
GFR calc Af Amer: 13 mL/min — ABNORMAL LOW (ref >60)
GFR calc non Af Amer: 11 mL/min — ABNORMAL LOW (ref >60)
Glucose, Bld: 155 mg/dL — ABNORMAL HIGH (ref 65–99)
Potassium: 4.3 mmol/L (ref 3.5–5.1)
Sodium: 140 mmol/L (ref 135–145)

## 2018-05-27 LAB — CBC
HCT: 29.2 % — ABNORMAL LOW (ref 36.0–46.0)
Hemoglobin: 8.8 g/dL — ABNORMAL LOW (ref 12.0–15.0)
MCH: 22.4 pg — ABNORMAL LOW (ref 26.0–34.0)
MCHC: 30.1 g/dL (ref 30.0–36.0)
MCV: 74.5 fL — ABNORMAL LOW (ref 78.0–100.0)
Platelets: 332 10*3/uL (ref 150–400)
RBC: 3.92 MIL/uL (ref 3.87–5.11)
RDW: 16 % — ABNORMAL HIGH (ref 11.5–15.5)
WBC: 9.7 10*3/uL (ref 4.0–10.5)

## 2018-05-27 LAB — HEPATIC FUNCTION PANEL
ALT: 11 U/L — ABNORMAL LOW (ref 14–54)
AST: 23 U/L (ref 15–41)
Albumin: 2.8 g/dL — ABNORMAL LOW (ref 3.5–5.0)
Alkaline Phosphatase: 101 U/L (ref 38–126)
Bilirubin, Direct: 0.1 mg/dL (ref 0.1–0.5)
Indirect Bilirubin: 0.2 mg/dL — ABNORMAL LOW (ref 0.3–0.9)
Total Bilirubin: 0.3 mg/dL (ref 0.3–1.2)
Total Protein: 7.7 g/dL (ref 6.5–8.1)

## 2018-05-27 LAB — D-DIMER, QUANTITATIVE: D-Dimer, Quant: 2.26 ug/mL-FEU — ABNORMAL HIGH (ref 0.00–0.50)

## 2018-05-27 MED ORDER — SODIUM CHLORIDE 0.9 % IV BOLUS
500.0000 mL | Freq: Once | INTRAVENOUS | Status: AC
  Administered 2018-05-28: 500 mL via INTRAVENOUS

## 2018-05-27 MED ORDER — ENOXAPARIN SODIUM 150 MG/ML ~~LOC~~ SOLN: 1.0000 mg/kg | Freq: Once | SUBCUTANEOUS | Status: DC

## 2018-05-27 MED ORDER — RIVAROXABAN 20 MG PO TABS
20.0000 mg | ORAL_TABLET | Freq: Every day | ORAL | Status: DC
  Filled 2018-05-27: qty 1

## 2018-05-27 NOTE — ED Provider Notes (Signed)
[facility] EMERGENCY DEPARTMENT Provider Note   CSN: [id number] Arrival date & time: 05/27/18  1914     History   Chief Complaint Chief Complaint  Patient presents with  . Knee Pain    HPI Colleen [name] is a 57 y.o. female.  HPI  Patient is a 57 year old female with a history of type 2 diabetes, hypertension, CHF, PE, DVT who presents to the emergency department from urgent care for evaluation of left knee pain and lower leg swelling.  Patient reports that she woke up about a week ago and noticed pain over the medial aspect of her left knee joint.  She reports that pain is constant, 8/10 in severity and feels aching and throbbing in nature.  Pain is acutely worsened with palpation and also with weightbearing.  She has not taken any over-the-counter medications for her symptoms.  She also reports that she has noticed that the entire left lower leg has been more swollen.  Per chart review, patient was hospitalized 09/2013 for septic shock complicated by PE and DVT.  Patient today states that she was unaware that she has had a blood clot in the legs or lungs before.  She is not sure if she is taking Xarelto currently.  She denies calf pain.  Denies fevers, chills, joint erythema, numbness, weakness, open wound, chest pain, shortness of breath.  She denies recent surgery or immobilization, exogenous estrogen, hemoptysis, active cancer.  Past Medical History:  Diagnosis Date  . Diabetes ([place])   . DVT (deep venous thrombosis) ([place])   . E-coli UTI   . PE (pulmonary embolism)   . 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 HTN (hypertension) 10/06/2013  . Acute pulmonary embolism ([place]) 10/06/2013  . Congestive dilated cardiomyopathy ([place]) 10/05/2013  . Carotid bruit 10/05/2013  . DM (diabetes mellitus), type 2, uncontrolled ([place]) 10/04/2013  . Acute on chronic  systolic heart failure ([place]) 10/02/2013  . E-coli UTI 10/02/2013  . ARDS (adult respiratory distress syndrome) ([place]) 09/27/2013  . Acute renal insufficiency 09/27/2013  . Acute respiratory failure with hypoxia ([place]) 09/25/2013  . Septic shock(785.52) 09/25/2013  . Bacteremia 09/25/2013    History reviewed. No pertinent surgical history.   OB History   None      Home Medications    Prior to Admission medications   Medication Sig Start Date End Date Taking? Authorizing Provider  Rivaroxaban (XARELTO) 20 MG TABS tablet Take 1 tablet (20 mg total) by mouth daily with supper. First dose on Sat 10/29/13 at 1700 10/29/13  Yes Hongalgi, Lenis Dickinson, MD  carvedilol (COREG) 25 MG tablet Take 1 tablet (25 mg total) by mouth 2 (two) times daily. 10/14/13   Fay Records, MD  Dapagliflozin Propanediol (FARXIGA) 10 MG TABS Take 10 mg by mouth daily.    [provider]  Iron 66 MG TABS Take by mouth.    [provider]  KOMBIGLYZE XR 2.04-999 MG TB24 Take 1,000 mg by mouth daily. 01/24/14   [provider]  lisinopril (PRINIVIL,ZESTRIL) 5 MG tablet Take 1 tablet (5 mg total) by mouth daily. 10/09/13   Modena Jansky, MD  ONE TOUCH ULTRA TEST test strip  10/09/13   [provider]  [place]  10/09/13   [provider]  UNABLE TO FIND 1. Glucometer - 1  2. Glucometer strips - 1 box. 3. Lancets - 1 box 10/09/13   Hongalgi, Lenis Dickinson, MD    Family History Family History  Problem Relation Age of Onset  . CAD Neg Hx     Social History Social History   Tobacco Use  . Smoking status: Never Smoker  . Smokeless tobacco: Never Used  Substance Use Topics  . Alcohol use: No  . Drug use: No     Allergies   Patient has no known allergies.   Review of Systems Review of Systems  Constitutional: Negative for chills and fever.  Respiratory: Negative for shortness of breath.   Cardiovascular: Positive for leg swelling (right lower  leg). Negative for chest pain.  Gastrointestinal: Negative for abdominal pain, nausea and vomiting.  Musculoskeletal: Positive for arthralgias (right knee). Negative for gait problem.  Skin: Negative for color change, rash and wound.  Neurological: Negative for weakness and numbness.     Physical Exam Updated Vital Signs BP (!) 185/88   Pulse (!) 110   Temp 98.1 F (36.7 C) (Oral)   Resp 20   Ht 5\' 4"  (1.626 m)   Wt 70.3 kg (155 lb)   SpO2 99%   BMI 26.61 kg/m   Physical Exam  Constitutional: She is oriented to person, place, and time. She appears well-developed and well-nourished. No distress.  Sitting at bedside in no apparent distress, nontoxic-appearing.  HENT:  Head: Normocephalic and atraumatic.  Eyes: Right eye exhibits no discharge. Left eye exhibits no discharge.  Cardiovascular:  Tachycardic, regular rate.  Pulmonary/Chest: Effort normal and breath sounds normal. No stridor. No respiratory distress. She has no wheezes. She has no rales.  Abdominal: Soft. There is no tenderness.  Musculoskeletal:  Right lower leg appears swollen compared to left.  Right calf measures 41.5 cm compared to left which measures 39.5 cm.  Right knee with tenderness over the medial aspect of the joint.  No overlying erythema, ecchymosis, rash or break in skin.  No varus or valgus laxity.  Negative drawers.  Full active flexion/extension of the knee, although tender with flexion.  DP pulses 2+ bilaterally.  Distal sensation to light touch intact in bilateral lower extremities.  Neurological: She is alert and oriented to person, place, and time. Coordination normal.  Skin: Skin is warm and dry. Capillary refill takes less than 2 seconds. She is not diaphoretic.  Psychiatric: She has a normal mood and affect. Her behavior is normal.  Nursing note and vitals reviewed.    ED Treatments / Results  Labs (all labs ordered are listed, but only abnormal results are displayed) Labs Reviewed  CBC -  Abnormal; Notable for the following components:      Result Value   Hemoglobin 8.8 (*)    HCT 29.2 (*)    MCV 74.5 (*)    MCH 22.4 (*)    RDW 16.0 (*)    All other components within normal limits  BASIC METABOLIC PANEL - Abnormal; Notable for the following components:   Chloride 112 (*)    CO2 20 (*)    Glucose, Bld 155 (*)    BUN 39 (*)    Creatinine, Ser 4.10 (*)    GFR calc non Af Amer 11 (*)    GFR calc Af Amer 13 (*)    All other components within normal limits  D-DIMER, QUANTITATIVE (NOT AT [facility]) - Abnormal; Notable for the following components:   D-Dimer, Quant 2.26 (*)    All other components within  normal limits  URINALYSIS, ROUTINE W REFLEX MICROSCOPIC - Abnormal; Notable for the following components:   Color, Urine STRAW (*)    APPearance HAZY (*)    Glucose, UA >=500 (*)    Hgb urine dipstick SMALL (*)    Protein, ur >=300 (*)    Leukocytes, UA SMALL (*)    Bacteria, UA RARE (*)    All other components within normal limits  HEPATIC FUNCTION PANEL - Abnormal; Notable for the following components:   Albumin 2.8 (*)    ALT 11 (*)    Indirect Bilirubin 0.2 (*)    All other components within normal limits  CBG MONITORING, ED - Abnormal; Notable for the following components:   Glucose-Capillary 139 (*)    All other components within normal limits  POC OCCULT BLOOD, ED    EKG None  Radiology No results found.  Procedures Procedures (including critical care time)  Medications Ordered in ED Medications  sodium chloride 0.9 % bolus 500 mL (has no administration in time range)     Initial Impression / Assessment and Plan / ED Course  I have reviewed the triage vital signs and the nursing notes.  Pertinent labs & imaging results that were available during my care of the patient were reviewed by me and considered in my medical decision making (see chart for details).    Patient presents to the emergency department for evaluation of left knee pain and left  lower leg swelling.  She has a history of PE and DVT in the past.  She does not know if she is taking Xarelto.  She had x-ray of left knee at urgent care which I reviewed.  Knee xray with mild joint effusion, no fracture or acute abnormality.  On exam left lower leg appears swollen compared to right.  Patient afebrile, no erythema or warmth over the knee and I am not concerned for septic joint at this time.  Left lower extremity neurovascularly intact.  Patient is tachycardic.  I am concerned for DVT, unfortunately vascular ultrasound is not here at this time of day.  Although she denies chest pain and shortness of breath, she is tachycardic.  Given leg swelling, tachycardia and hx of PE I will get d-dimer and basic chemistries for further evaluation.  Labs returned.  BMP reveals creatinine of 4.10, unfortunately the most recent creatinine we have on record was 4 years ago and creatinine 1.2 at that time.  She is presumed to have an AKI.  Potassium WNL.  Her d-dimer is elevated at 2.26.  Will not be able to get the CT Angio of the chest given patient's renal function, she will need a V/Q scan for further evaluation.  CBC without leukocytosis.  She is anemic with hemoglobin 8.8, which is new.  Did get Hemoccult test which was negative.  UA without evidence of infection, appears contaminated.  EKG without acute ischemic change.  Discussed this patient with hospitalist Dr. Hal Hope who will admit to medicine for elevated creatinine, leg swelling and elevated d-dimer.  This was a shared visit with Dr. Ralene Bathe who also saw the patient and agrees with plan to admit.  Patient is agreeable to this plan as well and has been informed about her lab work and testing today.  Final Clinical Impressions(s) / ED Diagnoses   Final diagnoses:  None    ED Discharge Orders        Ordered    LE VENOUS     05/27/18 2002  Glyn Ade, PA-C 05/28/18 0136    Quintella Reichert, MD 06/02/18 1023

## 2018-05-27 NOTE — ED Triage Notes (Signed)
Pt states that she was sent by UC for pain in her L knee that has been going on for a week with swelling, pt has a hx of DVT, denies CP/SOB.

## 2018-05-27 NOTE — ED Provider Notes (Signed)
Patient placed in Quick Look pathway, seen and evaluated   Chief Complaint: leg swelling HPI:   patient senty for DVT R/o . Hx of pe/dvt. Patient states she did not know this. On xarelto. Leg swelling x 1 week. Negative xray at [facility] today.denies CP/SOB   ROS: leg swelling  (one)  Physical Exam:   Gen: No distress  Neuro: Awake and Alert  Skin: Warm    Focused Exam: Left leg swelling mid thigh down on R   Initiation of care has begun. The patient has been counseled on the process, plan, and necessity for staying for the completion/evaluation, and the remainder of the medical screening examination    Ned Grace 05/27/18 2028    Dorie Rank, MD 05/30/18 [phone]

## 2018-05-28 ENCOUNTER — Observation Stay (HOSPITAL_COMMUNITY): Payer: Federal, State, Local not specified - PPO

## 2018-05-28 ENCOUNTER — Observation Stay (HOSPITAL_BASED_OUTPATIENT_CLINIC_OR_DEPARTMENT_OTHER)
Admit: 2018-05-28 | Discharge: 2018-05-28 | Disposition: A | Discharge status: Home / Self Care | Payer: Federal, State, Local not specified - PPO

## 2018-05-28 ENCOUNTER — Encounter (HOSPITAL_COMMUNITY): Payer: Self-pay | Admitting: Internal Medicine

## 2018-05-28 ENCOUNTER — Other Ambulatory Visit: Payer: Self-pay

## 2018-05-28 DIAGNOSIS — N184 Chronic kidney disease, stage 4 (severe): Secondary | ICD-10-CM | POA: Diagnosis not present

## 2018-05-28 DIAGNOSIS — M1712 Unilateral primary osteoarthritis, left knee: Secondary | ICD-10-CM | POA: Diagnosis not present

## 2018-05-28 DIAGNOSIS — E11649 Type 2 diabetes mellitus with hypoglycemia without coma: Secondary | ICD-10-CM | POA: Diagnosis not present

## 2018-05-28 DIAGNOSIS — M79609 Pain in unspecified limb: Secondary | ICD-10-CM

## 2018-05-28 DIAGNOSIS — M7989 Other specified soft tissue disorders: Secondary | ICD-10-CM

## 2018-05-28 DIAGNOSIS — N185 Chronic kidney disease, stage 5: Secondary | ICD-10-CM | POA: Diagnosis present

## 2018-05-28 DIAGNOSIS — D509 Iron deficiency anemia, unspecified: Secondary | ICD-10-CM | POA: Diagnosis present

## 2018-05-28 DIAGNOSIS — N179 Acute kidney failure, unspecified: Secondary | ICD-10-CM | POA: Diagnosis not present

## 2018-05-28 DIAGNOSIS — I129 Hypertensive chronic kidney disease with stage 1 through stage 4 chronic kidney disease, or unspecified chronic kidney disease: Secondary | ICD-10-CM | POA: Diagnosis not present

## 2018-05-28 DIAGNOSIS — E785 Hyperlipidemia, unspecified: Secondary | ICD-10-CM | POA: Diagnosis not present

## 2018-05-28 DIAGNOSIS — R600 Localized edema: Secondary | ICD-10-CM | POA: Diagnosis present

## 2018-05-28 LAB — BASIC METABOLIC PANEL
Anion gap: 6 (ref 5–15)
BUN: 37 mg/dL — ABNORMAL HIGH (ref 6–20)
CO2: 20 mmol/L — ABNORMAL LOW (ref 22–32)
Calcium: 8.3 mg/dL — ABNORMAL LOW (ref 8.9–10.3)
Chloride: 115 mmol/L — ABNORMAL HIGH (ref 101–111)
Creatinine, Ser: 3.75 mg/dL — ABNORMAL HIGH (ref 0.44–1.00)
GFR calc Af Amer: 15 mL/min — ABNORMAL LOW (ref >60)
GFR calc non Af Amer: 13 mL/min — ABNORMAL LOW (ref >60)
Glucose, Bld: 207 mg/dL — ABNORMAL HIGH (ref 65–99)
Potassium: 3.9 mmol/L (ref 3.5–5.1)
Sodium: 141 mmol/L (ref 135–145)

## 2018-05-28 LAB — CBC WITH DIFFERENTIAL/PLATELET
Abs Immature Granulocytes: 0 10*3/uL (ref 0.0–0.1)
Basophils Absolute: 0.1 10*3/uL (ref 0.0–0.1)
Basophils Relative: 1 %
Eosinophils Absolute: 0.1 10*3/uL (ref 0.0–0.7)
Eosinophils Relative: 2 %
HCT: 24.4 % — ABNORMAL LOW (ref 36.0–46.0)
Hemoglobin: 7.5 g/dL — ABNORMAL LOW (ref 12.0–15.0)
Immature Granulocytes: 0 %
Lymphocytes Relative: 16 %
Lymphs Abs: 1.4 10*3/uL (ref 0.7–4.0)
MCH: 22.5 pg — ABNORMAL LOW (ref 26.0–34.0)
MCHC: 30.7 g/dL (ref 30.0–36.0)
MCV: 73.3 fL — ABNORMAL LOW (ref 78.0–100.0)
Monocytes Absolute: 0.6 10*3/uL (ref 0.1–1.0)
Monocytes Relative: 7 %
Neutro Abs: 6.5 10*3/uL (ref 1.7–7.7)
Neutrophils Relative %: 74 %
Platelets: 283 10*3/uL (ref 150–400)
RBC: 3.33 MIL/uL — ABNORMAL LOW (ref 3.87–5.11)
RDW: 16.1 % — ABNORMAL HIGH (ref 11.5–15.5)
WBC: 8.7 10*3/uL (ref 4.0–10.5)

## 2018-05-28 LAB — GLUCOSE, CAPILLARY
Glucose-Capillary: 192 mg/dL — ABNORMAL HIGH (ref 65–99)
Glucose-Capillary: 225 mg/dL — ABNORMAL HIGH (ref 65–99)
Glucose-Capillary: 212 mg/dL — ABNORMAL HIGH (ref 65–99)
Glucose-Capillary: 179 mg/dL — ABNORMAL HIGH (ref 65–99)

## 2018-05-28 LAB — HEPATIC FUNCTION PANEL
ALT: 10 U/L — ABNORMAL LOW (ref 14–54)
AST: 17 U/L (ref 15–41)
Albumin: 2.2 g/dL — ABNORMAL LOW (ref 3.5–5.0)
Alkaline Phosphatase: 87 U/L (ref 38–126)
Bilirubin, Direct: <0.1 mg/dL — ABNORMAL LOW (ref 0.1–0.5)
Total Bilirubin: 0.2 mg/dL — ABNORMAL LOW (ref 0.3–1.2)
Total Protein: 6.5 g/dL (ref 6.5–8.1)

## 2018-05-28 LAB — URIC ACID: Uric Acid, Serum: 6.4 mg/dL (ref 2.3–6.6)

## 2018-05-28 LAB — HIV ANTIBODY (ROUTINE TESTING W REFLEX): HIV Screen 4th Generation wRfx: NONREACTIVE

## 2018-05-28 LAB — TSH: TSH: 1.519 u[IU]/mL (ref 0.350–4.500)

## 2018-05-28 LAB — PHOSPHORUS: Phosphorus: 4.2 mg/dL (ref 2.5–4.6)

## 2018-05-28 LAB — POC OCCULT BLOOD, ED: Fecal Occult Bld: NEGATIVE

## 2018-05-28 MED ORDER — AMLODIPINE BESYLATE 10 MG PO TABS
10.0000 mg | ORAL_TABLET | Freq: Every day | ORAL | Status: DC
  Administered 2018-05-28 – 2018-05-30 (×3): 10 mg via ORAL
  Filled 2018-05-28 (×3): qty 1

## 2018-05-28 MED ORDER — HEPARIN SODIUM (PORCINE) 5000 UNIT/ML IJ SOLN
5000.0000 [IU] | Freq: Three times a day (TID) | INTRAMUSCULAR | Status: DC
  Administered 2018-05-28 – 2018-05-30 (×6): 5000 [IU] via SUBCUTANEOUS
  Filled 2018-05-28 (×6): qty 1

## 2018-05-28 MED ORDER — SODIUM CHLORIDE 0.9 % IV SOLN
1.0000 g | INTRAVENOUS | Status: DC
  Administered 2018-05-28 – 2018-05-30 (×3): 1 g via INTRAVENOUS
  Filled 2018-05-28 (×2): qty 10
  Filled 2018-05-28: qty 1

## 2018-05-28 MED ORDER — ONDANSETRON HCL 4 MG/2ML IJ SOLN: 4.0000 mg | Freq: Four times a day (QID) | INTRAMUSCULAR | Status: DC | PRN

## 2018-05-28 MED ORDER — ACETAMINOPHEN 650 MG RE SUPP: 650.0000 mg | Freq: Four times a day (QID) | RECTAL | Status: DC | PRN

## 2018-05-28 MED ORDER — ONDANSETRON HCL 4 MG PO TABS: 4.0000 mg | ORAL_TABLET | Freq: Four times a day (QID) | ORAL | Status: DC | PRN

## 2018-05-28 MED ORDER — ACETAMINOPHEN 325 MG PO TABS: 650.0000 mg | ORAL_TABLET | Freq: Four times a day (QID) | ORAL | Status: DC | PRN

## 2018-05-28 MED ORDER — ATORVASTATIN CALCIUM 40 MG PO TABS
40.0000 mg | ORAL_TABLET | Freq: Every day | ORAL | Status: DC
  Administered 2018-05-28 – 2018-05-30 (×3): 40 mg via ORAL
  Filled 2018-05-28 (×3): qty 1

## 2018-05-28 MED ORDER — INSULIN ASPART 100 UNIT/ML ~~LOC~~ SOLN
0.0000 [IU] | Freq: Three times a day (TID) | SUBCUTANEOUS | Status: DC
  Administered 2018-05-28: 2 [IU] via SUBCUTANEOUS
  Administered 2018-05-28 (×2): 3 [IU] via SUBCUTANEOUS
  Administered 2018-05-29: 2 [IU] via SUBCUTANEOUS
  Administered 2018-05-29: 1 [IU] via SUBCUTANEOUS

## 2018-05-28 MED ORDER — SODIUM CHLORIDE 0.9 % IV SOLN
INTRAVENOUS | Status: DC | PRN
  Administered 2018-05-28: 07:00:00 via INTRAVENOUS

## 2018-05-28 MED ORDER — CARVEDILOL 6.25 MG PO TABS
6.2500 mg | ORAL_TABLET | Freq: Two times a day (BID) | ORAL | Status: DC
  Administered 2018-05-28 – 2018-05-30 (×6): 6.25 mg via ORAL
  Filled 2018-05-28 (×6): qty 1

## 2018-05-28 NOTE — ED Notes (Signed)
Attempted Report 

## 2018-05-28 NOTE — Consult Note (Signed)
[facility]  HISTORY AND PHYSICAL  Colleen [name] is an 57 y.o. female.    Chief Complaint: L Knee pain  HPI: Pt is a 51F with a PMH significant for HTN, DM, h/o DVT and PE, CKD, and congenital R-sided facial droop who is now seen in consultation at the request of Dr. Denton Brick for eval and recs re: AKI on CKD.    Pt follows with Dr. Marval Regal.  Last Cr was 2.5 on 02/2018.    She presented today with L LE swelling and pain.  LE dopplers negative.  Cr was found to be 4.10 with improvement to 3.75 after IVF.    Pt reports she's been taking Farxiga and lisinopril.  No NSAIDs.  NO IV contrast.  No hypotensive episodes noted.  Some low-grade fevers.    She can't move her L knee.  Very tender to touch, slight warmth, no erythema.    PMH: Past Medical History:  Diagnosis Date  . Chronic kidney disease   . Diabetes ([place])   . DVT (deep venous thrombosis) ([place])   . E-coli UTI   . PE (pulmonary embolism)   . Pulled muscle    pt has right sided facial droop from pulled muscle in face since birth   PSH: Past Surgical History:  Procedure Laterality Date  . NO PAST SURGERIES      Past Medical History:  Diagnosis Date  . Chronic kidney disease   . Diabetes ([place])   . DVT (deep venous thrombosis) ([place])   . E-coli UTI   . PE (pulmonary embolism)   . Pulled muscle    pt has right sided facial droop from pulled muscle in face since birth    Medications:   Scheduled: . amLODipine  10 mg Oral Daily  . atorvastatin  40 mg Oral Daily  . carvedilol  6.25 mg Oral BID WC  . heparin  5,000 Units Subcutaneous Q8H  . insulin aspart  0-9 Units Subcutaneous TID WC    Medications Prior to Admission  Medication Sig Dispense Refill  . amLODipine (NORVASC) 10 MG tablet Take 10 mg by mouth daily.    Marland Kitchen atorvastatin (LIPITOR) 40 MG tablet Take 40 mg by mouth daily.    . carvedilol (COREG) 25 MG tablet Take 6.25 mg by mouth 2 (two) times daily.  60 tablet 6  . Dapagliflozin  Propanediol (FARXIGA) 10 MG TABS Take 10 mg by mouth daily.    Marland Kitchen KOMBIGLYZE XR 2.04-999 MG TB24 Take 1,000 mg by mouth daily.    Marland Kitchen lisinopril (PRINIVIL,ZESTRIL) 5 MG tablet Take 1 tablet (5 mg total) by mouth daily. 30 tablet 0  . Rivaroxaban (XARELTO) 20 MG TABS tablet Take 1 tablet (20 mg total) by mouth daily with supper. First dose on Sat 10/29/13 at 1700 30 tablet 0  . UNABLE TO FIND 1. Glucometer - 1 2. Glucometer strips - 1 box. 3. Lancets - 1 box 1 Units 0    ALLERGIES:  No Known Allergies  FAM HX: Family History  Problem Relation Age of Onset  . CAD Neg Hx     Social History:   reports that she has never smoked. She has never used smokeless tobacco. She reports that she does not drink alcohol or use drugs.  ROS: ROS: all other systems negative except as per HPI  Blood pressure (!) 142/76, pulse (!) 102, temperature 98.2 F (36.8 C), temperature source Oral, resp. rate 20, height _0  (1.626 m), weight 70.5 kg (155  lb 8 oz), SpO2 94 %. PHYSICAL EXAM: Physical Exam  GEN NAD HEENT EOMI PERRL MMM NECK no JVD PULM clear bilaterally  CV RRR no m/r/g ABD soft nontender NABS EXT no LE edema, knee very painful esp medially.  Slight warmth, no erythema.  Pt can't really bend it NEURO AAO x 3 SKIN no skin rashes or lesions   Results for orders placed or performed during the hospital encounter of 05/27/18 (from the past 48 hour(s))  CBC     Status: Abnormal   Collection Time: 05/27/18  8:53 PM  Result Value Ref Range   WBC 9.7 4.0 - 10.5 K/uL   RBC 3.92 3.87 - 5.11 MIL/uL   Hemoglobin 8.8 (L) 12.0 - 15.0 g/dL   HCT 29.2 (L) 36.0 - 46.0 %   MCV 74.5 (L) 78.0 - 100.0 fL   MCH 22.4 (L) 26.0 - 34.0 pg   MCHC 30.1 30.0 - 36.0 g/dL   RDW 16.0 (H) 11.5 - 15.5 %   Platelets 332 150 - 400 K/uL    Comment: Performed at [facility], [address] [place], [place] [address]  Basic metabolic panel     Status: Abnormal   Collection Time: 05/27/18  8:53 PM  Result Value  Ref Range   Sodium 140 135 - 145 mmol/L   Potassium 4.3 3.5 - 5.1 mmol/L   Chloride 112 (H) 101 - 111 mmol/L   CO2 20 (L) 22 - 32 mmol/L   Glucose, Bld 155 (H) 65 - 99 mg/dL   BUN 39 (H) 6 - 20 mg/dL   Creatinine, Ser 4.10 (H) 0.44 - 1.00 mg/dL   Calcium 9.2 8.9 - 10.3 mg/dL   GFR calc non Af Amer 11 (L) >60 mL/min   GFR calc Af Amer 13 (L) >60 mL/min    Comment: (NOTE) The eGFR has been calculated using the CKD EPI equation. This calculation has not been validated in all clinical situations. eGFR's persistently <60 mL/min signify possible Chronic Kidney Disease.    Anion gap 8 5 - 15    Comment: Performed at [facility] [address], [place], [place] [address]  D-dimer, quantitative (not at [facility])     Status: Abnormal   Collection Time: 05/27/18  8:53 PM  Result Value Ref Range   D-Dimer, Quant 2.26 (H) 0.00 - 0.50 ug/mL-FEU    Comment: (NOTE) At the manufacturer cut-off of 0.50 ug/mL FEU, this assay has been documented to exclude PE with a sensitivity and negative predictive value of 97 to 99%.  At this time, this assay has not been approved by the FDA to exclude DVT/VTE. Results should be correlated with clinical presentation. Performed at [facility], [place] [address], [place], [place] [address]   Hepatic function panel     Status: Abnormal   Collection Time: 05/27/18  8:53 PM  Result Value Ref Range   Total Protein 7.7 6.5 - 8.1 g/dL   Albumin 2.8 (L) 3.5 - 5.0 g/dL   AST 23 15 - 41 U/L   ALT 11 (L) 14 - 54 U/L   Alkaline Phosphatase 101 38 - 126 U/L   Total Bilirubin 0.3 0.3 - 1.2 mg/dL   Bilirubin, Direct 0.1 0.1 - 0.5 mg/dL   Indirect Bilirubin 0.2 (L) 0.3 - 0.9 mg/dL    Comment: Performed at [facility] [address], [place], [place] [address]  POC CBG, ED     Status: Abnormal  Collection Time: 05/27/18  9:00 PM  Result Value Ref Range   Glucose-Capillary 139 (H) 65 - 99 mg/dL   Comment 1 Notify RN    Comment 2 Document in Chart    Urinalysis, Routine w reflex microscopic     Status: Abnormal   Collection Time: 05/27/18 11:06 PM  Result Value Ref Range   Color, Urine STRAW (A) YELLOW   APPearance HAZY (A) CLEAR   Specific Gravity, Urine 1.011 1.005 - 1.030   pH 6.0 5.0 - 8.0   Glucose, UA >=500 (A) NEGATIVE mg/dL   Hgb urine dipstick SMALL (A) NEGATIVE   Bilirubin Urine NEGATIVE NEGATIVE   Ketones, ur NEGATIVE NEGATIVE mg/dL   Protein, ur >=300 (A) NEGATIVE mg/dL   Nitrite NEGATIVE NEGATIVE   Leukocytes, UA SMALL (A) NEGATIVE   RBC / HPF 0-5 0 - 5 RBC/hpf   WBC, UA 6-10 0 - 5 WBC/hpf   Bacteria, UA RARE (A) NONE SEEN   Squamous Epithelial / LPF 11-20 0 - 5   Mucus PRESENT     Comment: Performed at [facility], [address], [place], [place] [address]  POC occult blood, ED     Status: None   Collection Time: 05/28/18  1:04 AM  Result Value Ref Range   Fecal Occult Bld NEGATIVE NEGATIVE  HIV antibody (Routine Testing)     Status: None   Collection Time: 05/28/18  7:26 AM  Result Value Ref Range   HIV Screen 4th Generation wRfx Non Reactive Non Reactive    Comment: (NOTE) Performed At: [facility] [address] [place], [place] [id number] Rush Farmer MD [id number] Performed at [facility], [place] [address] [place], [place] [address]   Hepatic function panel     Status: Abnormal   Collection Time: 05/28/18  7:26 AM  Result Value Ref Range   Total Protein 6.5 6.5 - 8.1 g/dL   Albumin 2.2 (L) 3.5 - 5.0 g/dL   AST 17 15 - 41 U/L   ALT 10 (L) 14 - 54 U/L   Alkaline Phosphatase 87 38 - 126 U/L   Total Bilirubin 0.2 (L) 0.3 - 1.2 mg/dL   Bilirubin, Direct <0.1 (L) 0.1 - 0.5 mg/dL   Indirect Bilirubin NOT CALCULATED 0.3 - 0.9 mg/dL    Comment: Performed at [facility] [address] [place], [place] [address]  Basic metabolic panel     Status: Abnormal   Collection Time: 05/28/18  7:26 AM  Result Value Ref Range   Sodium 141 135 - 145 mmol/L   Potassium 3.9 3.5 -  5.1 mmol/L   Chloride 115 (H) 101 - 111 mmol/L   CO2 20 (L) 22 - 32 mmol/L   Glucose, Bld 207 (H) 65 - 99 mg/dL   BUN 37 (H) 6 - 20 mg/dL   Creatinine, Ser 3.75 (H) 0.44 - 1.00 mg/dL   Calcium 8.3 (L) 8.9 - 10.3 mg/dL   GFR calc non Af Amer 13 (L) >60 mL/min   GFR calc Af Amer 15 (L) >60 mL/min    Comment: (NOTE) The eGFR has been calculated using the CKD EPI equation. This calculation has not been validated in all clinical situations. eGFR's persistently <60 mL/min signify possible Chronic Kidney Disease.    Anion gap 6 5 - 15    Comment: Performed at [facility] [address], [place], [place] [address]  Phosphorus     Status: None   Collection Time:  05/28/18  7:26 AM  Result Value Ref Range   Phosphorus 4.2 2.5 - 4.6 mg/dL    Comment: Performed at [facility], [place] [address] [place], [place] [address]  CBC WITH DIFFERENTIAL     Status: Abnormal   Collection Time: 05/28/18  7:26 AM  Result Value Ref Range   WBC 8.7 4.0 - 10.5 K/uL   RBC 3.33 (L) 3.87 - 5.11 MIL/uL   Hemoglobin 7.5 (L) 12.0 - 15.0 g/dL   HCT 24.4 (L) 36.0 - 46.0 %   MCV 73.3 (L) 78.0 - 100.0 fL   MCH 22.5 (L) 26.0 - 34.0 pg   MCHC 30.7 30.0 - 36.0 g/dL   RDW 16.1 (H) 11.5 - 15.5 %   Platelets 283 150 - 400 K/uL   Neutrophils Relative % 74 %   Neutro Abs 6.5 1.7 - 7.7 K/uL   Lymphocytes Relative 16 %   Lymphs Abs 1.4 0.7 - 4.0 K/uL   Monocytes Relative 7 %   Monocytes Absolute 0.6 0.1 - 1.0 K/uL   Eosinophils Relative 2 %   Eosinophils Absolute 0.1 0.0 - 0.7 K/uL   Basophils Relative 1 %   Basophils Absolute 0.1 0.0 - 0.1 K/uL   Immature Granulocytes 0 %   Abs Immature Granulocytes 0.0 0.0 - 0.1 K/uL    Comment: Performed at [facility], [address] [place], [place] [address]  TSH     Status: None   Collection Time: 05/28/18  7:26 AM  Result Value Ref Range   TSH 1.519 0.350 - 4.500 uIU/mL    Comment: Performed by a 3rd Generation assay with a functional sensitivity of  <=0.01 uIU/mL. Performed at [facility], [place] [address], [place], [place] [address]   Uric acid     Status: None   Collection Time: 05/28/18  7:26 AM  Result Value Ref Range   Uric Acid, Serum 6.4 2.3 - 6.6 mg/dL    Comment: Performed at [facility] [address], [place], [place] [address]  Glucose, capillary     Status: Abnormal   Collection Time: 05/28/18  7:57 AM  Result Value Ref Range   Glucose-Capillary 192 (H) 65 - 99 mg/dL   Comment 1 Notify RN   Glucose, capillary     Status: Abnormal   Collection Time: 05/28/18 11:24 AM  Result Value Ref Range   Glucose-Capillary 225 (H) 65 - 99 mg/dL   Comment 1 Notify RN   Glucose, capillary     Status: Abnormal   Collection Time: 05/28/18  4:29 PM  Result Value Ref Range   Glucose-Capillary 212 (H) 65 - 99 mg/dL   Comment 1 Notify RN     Dg Knee 1-2 Views Left  Result Date: 05/28/2018 CLINICAL DATA:  57 y/o  F; pain and edema in the left knee. EXAM: LEFT KNEE - 1-2 VIEW COMPARISON:  05/27/2018 left knee radiograph. FINDINGS: No evidence of fracture, dislocation, or joint effusion. Small inferior patellar enthesophyte. Stable mild medial femorotibial compartment joint space narrowing. IMPRESSION: 1.  No acute fracture or dislocation identified. 2. Stable mild narrowing of the medial femorotibial compartment. Electronically Signed   By: Kristine Garbe M.D.   On: 05/28/2018 05:54    Assessment/Plan 1.  AKI on CKD: Baseline creatinine 2.5 02/2018 attributed to DM and HTN.  Up to 4.10.  UA with > 300 mg protein, > 500 glucose, 11-20 squames, 6-10 WBCs.  Still taking Wilder Glade which is  contraindicated in eGFR < 45 and could cause AKI.  Stop gliflozin, stop lisinopril for now.  Sending UP/C.  Making good urine.  Don't have a strong suspicion for GN right now so don't think needs a serologic workup or biopsy at this point.  2.  LLE/ knee pain: DVT negative.  ? If she needs a joint tap based on tenderness and immobility vs  MRI but will defer to primary.  Uric acid 6.4.  3.  Anemia: down to 7.5.  Send iron studies.  Pt denies overt bleeding, FOBT negative  4.  HTN: coreg and amlodipine- hold lisinopril  5.  Dispo: pending improvement  Augustin Bun 05/28/2018, 4:57 PM

## 2018-05-28 NOTE — Progress Notes (Signed)
New Admission Note:   Arrival Method: from ED Mental Orientation: Alert and oriented x 4  Telemetry:3e 14 Assessment: Completed Skin: Intact Safety Measures: Safety Fall Prevention Plan has been discussed  Admission:  [facility] [place] Orientation: Patient has been oriented to the room, unit and staff.  Family: none at bedside  Orders to be reviewed and implemented. Will continue to monitor the patient. Call light has been placed within reach and bed alarm has been de-activated. Patient refusing bed alarm, stating that she is ok to go to the bathroom.  Venetia Night, RN Phone: [phone]

## 2018-05-28 NOTE — Progress Notes (Signed)
MD Hal Hope at bedside to see the patient.  Awaiting on new orders.  Almadelia Looman, RN

## 2018-05-28 NOTE — H&P (Addendum)
History and Physical    Colleen [name] OVF:[id number] DOB: 12/25/1960 DOA: 05/27/2018  PCP: Maury Dus, MD  Patient coming from: Home.  Chief Complaint: Left lower extremity swelling.  HPI: Colleen [name] is a 57 y.o. female with history of hypertension, diabetes mellitus, chronic kidney disease, previous history of DVT/PE was referred to the ER for left lower extremity swelling concerning for DVT.  Patient states over the last 2 weeks patient has been noticing increasing pain and swelling in the left lower extremity.  Patient has been experiencing increasing left knee pain and gradually noted increasing swelling below that.  Denies any chest pain shortness of breath.  Had gone to urgent care center over there patient had x-rays done yesterday which was negative as per the patient for anything acute was advised to come to the ER for the work-up for possible DVT rule out.  ED Course: In the ER patient's labs show creatinine of around 4 hemoglobin around 8 stool for occult blood was negative.  On exam patient's left leg is swollen below knee.  Patient also has tenderness in the left knee around the medial aspect.  It difficult to extend or flex.  Review of Systems: As per HPI, rest all negative.   Past Medical History:  Diagnosis Date  . Chronic kidney disease   . Diabetes ([place])   . DVT (deep venous thrombosis) ([place])   . E-coli UTI   . PE (pulmonary embolism)   . 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 NO PAST SURGERIES       reports that she has never smoked. She has never used smokeless tobacco. She reports that she does not drink alcohol or use drugs.  No Known Allergies  Family History  Problem Relation Age of Onset  . CAD Neg Hx     Prior to Admission medications   Medication Sig Start Date End Date Taking? Authorizing Provider  amLODipine (NORVASC) 10 MG  tablet Take 10 mg by mouth daily.   Yes [provider]  atorvastatin (LIPITOR) 40 MG tablet Take 40 mg by mouth daily.   Yes [provider]  carvedilol (COREG) 25 MG tablet Take 6.25 mg by mouth 2 (two) times daily.  10/14/13  Yes Fay Records, MD  Dapagliflozin Propanediol (FARXIGA) 10 MG TABS Take 10 mg by mouth daily.   Yes [provider]  KOMBIGLYZE XR 2.04-999 MG TB24 Take 1,000 mg by mouth daily. 01/24/14  Yes [provider]  lisinopril (PRINIVIL,ZESTRIL) 5 MG tablet Take 1 tablet (5 mg total) by mouth daily. 10/09/13  Yes Hongalgi, Lenis Dickinson, MD  Rivaroxaban (XARELTO) 20 MG TABS tablet Take 1 tablet (20 mg total) by mouth daily with supper. First dose on Sat 10/29/13 at 1700 10/29/13  Yes Hongalgi, Lenis Dickinson, MD  UNABLE TO FIND 1. Glucometer - 1 2. Glucometer strips - 1 box. 3. Lancets - 1 box 10/09/13  Yes Hongalgi, Lenis Dickinson, MD  Iron 66 MG TABS Take by mouth.    [provider]    Physical Exam: Vitals:   05/28/18 0200 05/28/18 0247 05/28/18 0306 05/28/18 0436  BP: (!) 157/86  138/84 (!) 148/73  Pulse: (!) 103  (!) 106 (!) 103  Resp: 13  16 14   Temp:   98.5 F (36.9 C) 99.1 F (37.3 C)  TempSrc:   Oral  Oral  SpO2: 98%  98% 97%  Weight:  70.5 kg (155 lb 8 oz)    Height:  5\' 4"  (1.626 m)        Constitutional: Moderately built and nourished. Vitals:   05/28/18 0200 05/28/18 0247 05/28/18 0306 05/28/18 0436  BP: (!) 157/86  138/84 (!) 148/73  Pulse: (!) 103  (!) 106 (!) 103  Resp: 13  16 14   Temp:   98.5 F (36.9 C) 99.1 F (37.3 C)  TempSrc:   Oral Oral  SpO2: 98%  98% 97%  Weight:  70.5 kg (155 lb 8 oz)    Height:  5\' 4"  (1.626 m)     Eyes: Anicteric no pallor. ENMT: No discharge from the ears eyes nose or mouth. Neck: No mass felt.  No neck rigidity. Respiratory: No rhonchi or crepitations. Cardiovascular: S1-S2 heard no murmurs appreciated. Abdomen: Soft nontender bowel sounds present. Musculoskeletal: Left lower  extremity swelling with pain in the left knee Skin: No rash. Neurologic: Alert awake oriented to time place and person.  Moves all extremities. Psychiatric: Appears normal   Labs on Admission: I have personally reviewed following labs and imaging studies  CBC: Recent Labs  Lab 05/27/18 2053  WBC 9.7  HGB 8.8*  HCT 29.2*  MCV 74.5*  PLT 237   Basic Metabolic Panel: Recent Labs  Lab 05/27/18 2053  NA 140  K 4.3  CL 112*  CO2 20*  GLUCOSE 155*  BUN 39*  CREATININE 4.10*  CALCIUM 9.2   GFR: Estimated Creatinine Clearance: 14.9 mL/min (A) (by C-G formula based on SCr of 4.1 mg/dL (H)). Liver Function Tests: Recent Labs  Lab 05/27/18 2053  AST 23  ALT 11*  ALKPHOS 101  BILITOT 0.3  PROT 7.7  ALBUMIN 2.8*   No results for input(s): LIPASE, AMYLASE in the last 168 hours. No results for input(s): AMMONIA in the last 168 hours. Coagulation Profile: No results for input(s): INR, PROTIME in the last 168 hours. Cardiac Enzymes: No results for input(s): CKTOTAL, CKMB, CKMBINDEX, TROPONINI in the last 168 hours. BNP (last 3 results) No results for input(s): PROBNP in the last 8760 hours. HbA1C: No results for input(s): HGBA1C in the last 72 hours. CBG: Recent Labs  Lab 05/27/18 2100  GLUCAP 139*   Lipid Profile: No results for input(s): CHOL, HDL, LDLCALC, TRIG, CHOLHDL, LDLDIRECT in the last 72 hours. Thyroid Function Tests: No results for input(s): TSH, T4TOTAL, FREET4, T3FREE, THYROIDAB in the last 72 hours. Anemia Panel: No results for input(s): VITAMINB12, FOLATE, FERRITIN, TIBC, IRON, RETICCTPCT in the last 72 hours. Urine analysis:    Component Value Date/Time   COLORURINE STRAW (A) 05/27/2018 2306   APPEARANCEUR HAZY (A) 05/27/2018 2306   LABSPEC 1.011 05/27/2018 2306   PHURINE 6.0 05/27/2018 2306   GLUCOSEU >=500 (A) 05/27/2018 2306   HGBUR SMALL (A) 05/27/2018 2306   BILIRUBINUR NEGATIVE 05/27/2018 2306   KETONESUR NEGATIVE 05/27/2018 2306    PROTEINUR >=300 (A) 05/27/2018 2306   UROBILINOGEN 0.2 09/22/2013 0803   NITRITE NEGATIVE 05/27/2018 2306   LEUKOCYTESUR SMALL (A) 05/27/2018 2306   Sepsis Labs: @LABRCNTIP (procalcitonin:4,lacticidven:4) )No results found for this or any previous visit (from the past 240 hour(s)).   Radiological Exams on Admission: No results found.  EKG: Independently reviewed.  Normal sinus rhythm.  Assessment/Plan Principal Problem:   Swelling of lower extremity Active Problems:   DM (diabetes mellitus), type 2, uncontrolled (HCC)   HTN (hypertension)   CKD (chronic kidney disease) stage 4,  GFR 15-29 ml/min (HCC)   Hypochromic microcytic anemia   Lower extremity edema    1. Left lower extremity edema and left knee pain -I have ordered Dopplers of the left lower extremity rule out DVT and also x-ray of the left knee.  Check uric acid levels.  Based on these test for the plans.  Patient may need MRI of the left knee pain persist. 2. Chronic kidney disease stage IV-V-patient does not recall her last creatinine.  Has been told Dr. Arty Baumgartner nephrologist 6 months ago.  Please discuss with nephrologist in a.m. to see her baseline creatinine.  Follow intake output metabolic panel. 3. Hypertension -on amlodipine and Coreg.  Will hold off lisinopril due to renal failure.  Patient does not recall taking lisinopril.  Closely follow intake output. 4. Diabetes mellitus type 2 -we will keep patient on sliding scale coverage. 5. Microcytic hypochromic anemia -we will check anemia panel follow CBC.  Renal failure. 6. Possible UTI on ceftriaxone follow urine cultures.  In the morning may discuss with patient's nephrologist Dr. Arty Baumgartner with home patient had follow-up 6 months ago, about patient's baseline creatinine and hemoglobin.   DVT prophylaxis: Heparin. Code Status: Full code. Family Communication: Discussed with patient. Disposition Plan: Home. Consults called: None. Admission status:  Inpatient.   Rise Patience MD [facility] Pager [phone].  If 7PM-7AM, please contact night-coverage [email or web address] Password [facility]  05/28/2018, 5:14 AM

## 2018-05-28 NOTE — Progress Notes (Signed)
Left lower extremity venous duplex has been completed. Negative for DVT.  05/28/18 9:05 AM Colleen [name] RVT

## 2018-05-28 NOTE — Progress Notes (Signed)
Patient seen and evaluated, chart reviewed, please see EMR for updated orders. Please see full H&P dictated by admitting physician Dr Renetta Chalk for same date of service.    Brief Summary:- 57 y.o. female with history of hypertension, diabetes mellitus, chronic kidney disease, previous history of DVT/PE admitted 05/28/18 with worsening anemia, worsening renal function and left lower extremity pain and swelling  Plan:- 1)AKI----acute kidney injury on CKD stage -  IV   creatinine on admission= 4  ,   baseline creatinine = 2.7 (in May 2019, was 2.2 in August 2018)    , creatinine is now= 3.7      ,  Avoid nephrotoxic agents/dehydration/hypotension, discussed with Dr. Madelon Lips, official nephrology consult pending  2)Acute on chronic anemia--- Hgb is around 8, occult blood is negative, may be a candidate for EPO  3)DM2- Use Novolog/Humalog Sliding scale insulin with Accu-Cheks/Fingersticks as ordered   4)HTN-- c/n amlodipine and Coreg, lisinopril on hold due to kidney concerns  5)Possible UTI-  C/n rocephin pending cx   Patient seen and evaluated, chart reviewed, please see EMR for updated orders. Please see full H&P dictated by admitting physician Dr Renetta Chalk for same date of service.

## 2018-05-29 ENCOUNTER — Observation Stay (HOSPITAL_COMMUNITY): Payer: Federal, State, Local not specified - PPO

## 2018-05-29 DIAGNOSIS — N179 Acute kidney failure, unspecified: Secondary | ICD-10-CM | POA: Diagnosis not present

## 2018-05-29 DIAGNOSIS — M7989 Other specified soft tissue disorders: Secondary | ICD-10-CM | POA: Diagnosis not present

## 2018-05-29 DIAGNOSIS — N184 Chronic kidney disease, stage 4 (severe): Secondary | ICD-10-CM | POA: Diagnosis not present

## 2018-05-29 DIAGNOSIS — N189 Chronic kidney disease, unspecified: Secondary | ICD-10-CM | POA: Diagnosis not present

## 2018-05-29 DIAGNOSIS — E785 Hyperlipidemia, unspecified: Secondary | ICD-10-CM | POA: Diagnosis not present

## 2018-05-29 DIAGNOSIS — I129 Hypertensive chronic kidney disease with stage 1 through stage 4 chronic kidney disease, or unspecified chronic kidney disease: Secondary | ICD-10-CM | POA: Diagnosis not present

## 2018-05-29 LAB — CBC
HCT: 22.7 % — ABNORMAL LOW (ref 36.0–46.0)
Hemoglobin: 7 g/dL — ABNORMAL LOW (ref 12.0–15.0)
MCH: 22.7 pg — ABNORMAL LOW (ref 26.0–34.0)
MCHC: 30.8 g/dL (ref 30.0–36.0)
MCV: 73.5 fL — ABNORMAL LOW (ref 78.0–100.0)
Platelets: 252 10*3/uL (ref 150–400)
RBC: 3.09 MIL/uL — ABNORMAL LOW (ref 3.87–5.11)
RDW: 16.1 % — ABNORMAL HIGH (ref 11.5–15.5)
WBC: 8.7 10*3/uL (ref 4.0–10.5)

## 2018-05-29 LAB — RENAL FUNCTION PANEL
Albumin: 2.1 g/dL — ABNORMAL LOW (ref 3.5–5.0)
Anion gap: 7 (ref 5–15)
BUN: 43 mg/dL — ABNORMAL HIGH (ref 6–20)
CO2: 21 mmol/L — ABNORMAL LOW (ref 22–32)
Calcium: 8.6 mg/dL — ABNORMAL LOW (ref 8.9–10.3)
Chloride: 113 mmol/L — ABNORMAL HIGH (ref 101–111)
Creatinine, Ser: 3.78 mg/dL — ABNORMAL HIGH (ref 0.44–1.00)
GFR calc Af Amer: 14 mL/min — ABNORMAL LOW (ref >60)
GFR calc non Af Amer: 12 mL/min — ABNORMAL LOW (ref >60)
Glucose, Bld: 191 mg/dL — ABNORMAL HIGH (ref 65–99)
Phosphorus: 5.3 mg/dL — ABNORMAL HIGH (ref 2.5–4.6)
Potassium: 3.9 mmol/L (ref 3.5–5.1)
Sodium: 141 mmol/L (ref 135–145)

## 2018-05-29 LAB — GLUCOSE, CAPILLARY
Glucose-Capillary: 132 mg/dL — ABNORMAL HIGH (ref 65–99)
Glucose-Capillary: 177 mg/dL — ABNORMAL HIGH (ref 65–99)
Glucose-Capillary: 275 mg/dL — ABNORMAL HIGH (ref 65–99)

## 2018-05-29 LAB — URINE CULTURE: Culture: 50000 — AB

## 2018-05-29 LAB — PROTEIN / CREATININE RATIO, URINE
Creatinine, Urine: 62.09 mg/dL
Protein Creatinine Ratio: 6.94 mg/mg{Cre} — ABNORMAL HIGH (ref 0.00–0.15)
Total Protein, Urine: 431 mg/dL

## 2018-05-29 MED ORDER — INSULIN ASPART 100 UNIT/ML ~~LOC~~ SOLN
0.0000 [IU] | Freq: Three times a day (TID) | SUBCUTANEOUS | Status: DC
  Administered 2018-05-29: 8 [IU] via SUBCUTANEOUS
  Administered 2018-05-30: 13 [IU] via SUBCUTANEOUS
  Administered 2018-05-30: 15 [IU] via SUBCUTANEOUS
  Administered 2018-05-30: 8 [IU] via SUBCUTANEOUS

## 2018-05-29 MED ORDER — INSULIN ASPART 100 UNIT/ML ~~LOC~~ SOLN
0.0000 [IU] | Freq: Every day | SUBCUTANEOUS | Status: DC
  Administered 2018-05-29: 4 [IU] via SUBCUTANEOUS

## 2018-05-29 MED ORDER — OXYCODONE HCL 5 MG PO TABS
5.0000 mg | ORAL_TABLET | Freq: Once | ORAL | Status: AC
  Administered 2018-05-29: 5 mg via ORAL
  Filled 2018-05-29: qty 1

## 2018-05-29 MED ORDER — PREDNISONE 50 MG PO TABS
50.0000 mg | ORAL_TABLET | Freq: Every day | ORAL | Status: DC
  Administered 2018-05-29 – 2018-05-30 (×2): 50 mg via ORAL
  Filled 2018-05-29 (×2): qty 1

## 2018-05-29 NOTE — Progress Notes (Signed)
Patient Demographics:    Colleen [name], is a 57 y.o. female, DOB - 01-15-1961, QIO:[id number]  Admit date - 05/27/2018   Admitting Physician Rise Patience, MD  Outpatient Primary MD for the patient is Maury Dus, MD  LOS - 0   Chief Complaint  Patient presents with  . Knee Pain        Subjective:    Colleen [name] today has no fevers, no emesis,  No chest pain, c/o  left knee pain, voiding well  Assessment  & Plan :    Principal Problem:   Swelling of lower extremity Active Problems:   DM (diabetes mellitus), type 2, uncontrolled (HCC)   HTN (hypertension)   CKD (chronic kidney disease) stage 4, GFR 15-29 ml/min (HCC)   Hypochromic microcytic anemia   Lower extremity edema  57 y.o. female with history of hypertension, diabetes mellitus, chronic kidney disease, previous history of DVT/PE admitted 05/28/18 with worsening anemia, worsening renal function and left lower extremity pain and swelling  Plan:- 1)AKI----acute kidney injury on CKD stage -  IV   creatinine on admission= 4  ,   baseline creatinine = 2.5 (in March 2019, was 2.2 in August 2018)    , creatinine is now= 3.7      ,  Avoid nephrotoxic agents/dehydration/hypotension, discussed with Dr. Madelon Lips,  nephrology consult appreciated, urine output remains good  2)Acute on chronic anemia--- Hgb is down to 7.0, from 8 [place], occult blood is negative, may be a candidate for EPO, nephrology service as ordered anemia work-up including iron studies  3)DM2-anticipate worsening glycemic control with steroids use Novolog/Humalog Sliding scale insulin with Accu-Cheks/Fingersticks as ordered, Wilder Glade discontinued due to kidney findings  4)HTN--stable, c/n amlodipine 10 mg daily and Coreg 6.25 mg twice daily, lisinopril on hold due to kidney concerns  5)Possible UTI-  C/n rocephin pending cx  6)Lt Leg/Knee Pain--- x-rays of the  left knee without acute findings, lower extremity venous Dopplers without DVT, clinically suspect inflammatory arthritis possibly gout, empiric prednisone treatment discussed with patient she is agreeable to try this, unable to use indomethacin or NSAIDs due to kidney concerns   Code Status : Full   Disposition Plan  : home  Consults  : Nephrology   DVT Prophylaxis  :- Heparin -  Lab Results  Component Value Date   PLT 252 05/29/2018    Inpatient Medications  Scheduled Meds: . amLODipine  10 mg Oral Daily  . atorvastatin  40 mg Oral Daily  . carvedilol  6.25 mg Oral BID WC  . heparin  5,000 Units Subcutaneous Q8H  . insulin aspart  0-15 Units Subcutaneous TID WC  . insulin aspart  0-5 Units Subcutaneous QHS  . predniSONE  50 mg Oral Q breakfast   Continuous Infusions: . sodium chloride Stopped (05/29/18 0655)  . cefTRIAXone (ROCEPHIN)  IV Stopped (05/29/18 3244)   PRN Meds:.sodium chloride, acetaminophen **OR** acetaminophen, ondansetron **OR** ondansetron (ZOFRAN) IV    Anti-infectives (From admission, onward)   Start     Dose/Rate Route Frequency Ordered Stop   05/28/18 0545  cefTRIAXone (ROCEPHIN) 1 g in sodium chloride 0.9 % 100 mL IVPB     1 g 200 mL/hr over 30 Minutes Intravenous Every 24 hours 05/28/18 0536  Objective:   Vitals:   05/29/18 0021 05/29/18 0306 05/29/18 0331 05/29/18 1124  BP: (!) 142/73 (!) 142/67  132/70  Pulse: 97 97  93  Resp:  18  20  Temp: 99 F (37.2 C) 99.1 F (37.3 C)  98.6 F (37 C)  TempSrc: Oral Oral  Oral  SpO2: 96% 99%  96%  Weight:   71.6 kg (157 lb 12.8 oz)   Height:        Wt Readings from Last 3 Encounters:  05/29/18 71.6 kg (157 lb 12.8 oz)  01/30/14 71.1 kg (156 lb 12.8 oz)  10/22/13 67.6 kg (149 lb)     Intake/Output Summary (Last 24 hours) at 05/29/2018 1813 Last data filed at 05/29/2018 1600 Gross per 24 hour  Intake 702.21 ml  Output 1500 ml  Net -797.79 ml     Physical Exam  Gen:- Awake  Alert,  In no apparent distress  HEENT:- [place].AT, No sclera icterus Neck-Supple Neck,No JVD,.  Lungs-  CTAB , good air movement CV- S1, S2 normal Abd-  +ve B.Sounds, Abd Soft, No tenderness,    Extremity/Skin:-Left knee and lower extremity swelling, left knee warmth tenderness suggestive of inflammatory arthritis Psych-affect is appropriate, oriented x3 Neuro-no new focal deficits, patient has congenital facial droop/asymmetry, no tremors   Data Review:   Micro Results Recent Results (from the past 240 hour(s))  Culture, Urine     Status: Abnormal   Collection Time: 05/28/18  5:36 AM  Result Value Ref Range Status   Specimen Description URINE, RANDOM  Final   Special Requests NONE  Final   Culture (A)  Final    50,000 COLONIES/mL DIPHTHEROIDS(CORYNEBACTERIUM SPECIES) Standardized susceptibility testing for this organism is not available. Performed at [facility], [place] [address], [place], [place] [address]    Report Status 05/29/2018 FINAL  Final    Radiology Reports Dg Knee 1-2 Views Left  Result Date: 05/28/2018 CLINICAL DATA:  57 y/o  F; pain and edema in the left knee. EXAM: LEFT KNEE - 1-2 VIEW COMPARISON:  05/27/2018 left knee radiograph. FINDINGS: No evidence of fracture, dislocation, or joint effusion. Small inferior patellar enthesophyte. Stable mild medial femorotibial compartment joint space narrowing. IMPRESSION: 1.  No acute fracture or dislocation identified. 2. Stable mild narrowing of the medial femorotibial compartment. Electronically Signed   By: Kristine Garbe M.D.   On: 05/28/2018 05:54     CBC Recent Labs  Lab 05/27/18 2053 05/28/18 0726 05/29/18 1051  WBC 9.7 8.7 8.7  HGB 8.8* 7.5* 7.0*  HCT 29.2* 24.4* 22.7*  PLT 332 283 252  MCV 74.5* 73.3* 73.5*  MCH 22.4* 22.5* 22.7*  MCHC 30.1 30.7 30.8  RDW 16.0* 16.1* 16.1*  LYMPHSABS  --  1.4  --   MONOABS  --  0.6  --   EOSABS  --  0.1  --   BASOSABS  --  0.1  --     Chemistries    Recent Labs  Lab 05/27/18 2053 05/28/18 0726 05/29/18 1051  NA 140 141 141  K 4.3 3.9 3.9  CL 112* 115* 113*  CO2 20* 20* 21*  GLUCOSE 155* 207* 191*  BUN 39* 37* 43*  CREATININE 4.10* 3.75* 3.78*  CALCIUM 9.2 8.3* 8.6*  AST 23 17  --   ALT 11* 10*  --   ALKPHOS 101 87  --   BILITOT 0.3 0.2*  --    ------------------------------------------------------------------------------------------------------------------ No results for input(s): CHOL, HDL, LDLCALC, TRIG, CHOLHDL, LDLDIRECT  in the last 72 hours.  Lab Results  Component Value Date   HGBA1C 13.4 (H) 10/02/2013   ------------------------------------------------------------------------------------------------------------------ Recent Labs    05/28/18 0726  TSH 1.519   ------------------------------------------------------------------------------------------------------------------ No results for input(s): VITAMINB12, FOLATE, FERRITIN, TIBC, IRON, RETICCTPCT in the last 72 hours.  Coagulation profile No results for input(s): INR, PROTIME in the last 168 hours.  Recent Labs    05/27/18 2053  DDIMER 2.26*    Cardiac Enzymes No results for input(s): CKMB, TROPONINI, MYOGLOBIN in the last 168 hours.  Invalid input(s): CK ------------------------------------------------------------------------------------------------------------------ No results found for: BNP   Roxan Hockey M.D on 05/29/2018 at 6:13 PM  Between 7am to 7pm - Pager - [phone]  After 7pm go to [email or web address] - password [email or web address]  [facility] -  Office  [phone]   Voice Recognition Viviann Spare dictation system was used to create this note, attempts have been made to correct errors. Please contact the author with questions and/or clarifications.

## 2018-05-29 NOTE — Progress Notes (Signed)
[facility] Progress Note    Assessment/ Plan:   1.  AKI on CKD: Baseline creatinine 2.5 02/2018 attributed to DM and HTN.  Up to 4.10.  UA with > 300 mg protein, > 500 glucose, 11-20 squames, 6-10 WBCs.  Still taking Wilder Glade which is contraindicated in eGFR < 45 and could cause AKI.  Stop gliflozin, stop lisinopril for now.  Sending UP/C, serum free light chains, SPEP.  Making good urine.  Don't have a strong suspicion for GN right now so don't think needs a serologic workup or biopsy at this point.  Repeat US  2.  LLE/ knee pain: DVT negative.  Uric acid 6.4. On pred taper  3.  Anemia: down to 7.5.  Send iron studies.  Pt denies overt bleeding, FOBT negative  4.  HTN: coreg and amlodipine- hold lisinopril  5.  Dispo: pending improvement  Subjective:    L knee feels better- on pred taper for possible gout.  CTX for possible UTI.  Cr 3.7.   Objective:   BP 132/70 (BP Location: Left Arm)   Pulse 93   Temp 98.6 F (37 C) (Oral)   Resp 20   Ht _0  (1.626 m)   Wt 71.6 kg (157 lb 12.8 oz)   SpO2 96%   BMI 27.09 kg/m   Intake/Output Summary (Last 24 hours) at 05/29/2018 1518 Last data filed at 05/29/2018 0900 Gross per 24 hour  Intake 702.21 ml  Output 900 ml  Net -197.79 ml   Weight change: 0.816 kg (1 lb 12.8 oz)  Physical Exam: GEN NAD HEENT EOMI PERRL MMM NECK no JVD PULM clear bilaterally  CV RRR no m/r/g ABD soft nontender NABS EXT no LE edema, L knee tender but is able to put weight on it today and bend it.   NEURO AAO x 3 SKIN no skin rashes or lesions  Imaging: Dg Knee 1-2 Views Left  Result Date: 05/28/2018 CLINICAL DATA:  57 y/o  F; pain and edema in the left knee. EXAM: LEFT KNEE - 1-2 VIEW COMPARISON:  05/27/2018 left knee radiograph. FINDINGS: No evidence of fracture, dislocation, or joint effusion. Small inferior patellar enthesophyte. Stable mild medial femorotibial compartment joint space narrowing. IMPRESSION: 1.  No acute fracture  or dislocation identified. 2. Stable mild narrowing of the medial femorotibial compartment. Electronically Signed   By: Kristine Garbe M.D.   On: 05/28/2018 05:54    Labs: BMET Recent Labs  Lab 05/27/18 2053 05/28/18 0726 05/29/18 1051  NA 140 141 141  K 4.3 3.9 3.9  CL 112* 115* 113*  CO2 20* 20* 21*  GLUCOSE 155* 207* 191*  BUN 39* 37* 43*  CREATININE 4.10* 3.75* 3.78*  CALCIUM 9.2 8.3* 8.6*  PHOS  --  4.2 5.3*   CBC Recent Labs  Lab 05/27/18 2053 05/28/18 0726 05/29/18 1051  WBC 9.7 8.7 8.7  NEUTROABS  --  6.5  --   HGB 8.8* 7.5* 7.0*  HCT 29.2* 24.4* 22.7*  MCV 74.5* 73.3* 73.5*  PLT 332 283 252    Medications:    . amLODipine  10 mg Oral Daily  . atorvastatin  40 mg Oral Daily  . carvedilol  6.25 mg Oral BID WC  . heparin  5,000 Units Subcutaneous Q8H  . insulin aspart  0-15 Units Subcutaneous TID WC  . insulin aspart  0-5 Units Subcutaneous QHS  . predniSONE  50 mg Oral Q breakfast      Madelon Lips MD 05/29/2018, 3:18  PM  

## 2018-05-30 DIAGNOSIS — N184 Chronic kidney disease, stage 4 (severe): Secondary | ICD-10-CM | POA: Diagnosis not present

## 2018-05-30 DIAGNOSIS — I129 Hypertensive chronic kidney disease with stage 1 through stage 4 chronic kidney disease, or unspecified chronic kidney disease: Secondary | ICD-10-CM | POA: Diagnosis not present

## 2018-05-30 DIAGNOSIS — E785 Hyperlipidemia, unspecified: Secondary | ICD-10-CM | POA: Diagnosis not present

## 2018-05-30 DIAGNOSIS — N179 Acute kidney failure, unspecified: Secondary | ICD-10-CM | POA: Diagnosis not present

## 2018-05-30 DIAGNOSIS — M7989 Other specified soft tissue disorders: Secondary | ICD-10-CM | POA: Diagnosis not present

## 2018-05-30 LAB — RENAL FUNCTION PANEL
Albumin: 2.3 g/dL — ABNORMAL LOW (ref 3.5–5.0)
Anion gap: 9 (ref 5–15)
BUN: 49 mg/dL — ABNORMAL HIGH (ref 6–20)
CO2: 18 mmol/L — ABNORMAL LOW (ref 22–32)
Calcium: 9.1 mg/dL (ref 8.9–10.3)
Chloride: 108 mmol/L (ref 101–111)
Creatinine, Ser: 3.9 mg/dL — ABNORMAL HIGH (ref 0.44–1.00)
GFR calc Af Amer: 14 mL/min — ABNORMAL LOW (ref >60)
GFR calc non Af Amer: 12 mL/min — ABNORMAL LOW (ref >60)
Glucose, Bld: 313 mg/dL — ABNORMAL HIGH (ref 65–99)
Phosphorus: 4.4 mg/dL (ref 2.5–4.6)
Potassium: 3.9 mmol/L (ref 3.5–5.1)
Sodium: 135 mmol/L (ref 135–145)

## 2018-05-30 LAB — HEMOGLOBIN AND HEMATOCRIT, BLOOD
HCT: 23.8 % — ABNORMAL LOW (ref 36.0–46.0)
Hemoglobin: 7.4 g/dL — ABNORMAL LOW (ref 12.0–15.0)

## 2018-05-30 LAB — IRON AND TIBC
Iron: 48 ug/dL (ref 28–170)
Saturation Ratios: 21 % (ref 10.4–31.8)
TIBC: 232 ug/dL — ABNORMAL LOW (ref 250–450)
UIBC: 184 ug/dL

## 2018-05-30 LAB — GLUCOSE, CAPILLARY
Glucose-Capillary: 302 mg/dL — ABNORMAL HIGH (ref 65–99)
Glucose-Capillary: 287 mg/dL — ABNORMAL HIGH (ref 65–99)
Glucose-Capillary: 327 mg/dL — ABNORMAL HIGH (ref 65–99)
Glucose-Capillary: 368 mg/dL — ABNORMAL HIGH (ref 65–99)

## 2018-05-30 LAB — FERRITIN: Ferritin: 177 ng/mL (ref 11–307)

## 2018-05-30 MED ORDER — SAXAGLIPTIN-METFORMIN ER 2.5-1000 MG PO TB24: 1000.0000 mg | ORAL_TABLET | Freq: Every day | ORAL | 2 refills | Status: DC

## 2018-05-30 MED ORDER — ATORVASTATIN CALCIUM 40 MG PO TABS: 40.0000 mg | ORAL_TABLET | Freq: Every day | ORAL | 2 refills | Status: DC

## 2018-05-30 MED ORDER — PREDNISONE 20 MG PO TABS: 40.0000 mg | ORAL_TABLET | Freq: Every day | ORAL | Status: DC

## 2018-05-30 MED ORDER — INSULIN DETEMIR 100 UNIT/ML FLEXPEN: 8.0000 [IU] | PEN_INJECTOR | Freq: Every day | SUBCUTANEOUS | 2 refills | Status: DC

## 2018-05-30 MED ORDER — AMLODIPINE BESYLATE 10 MG PO TABS: 10.0000 mg | ORAL_TABLET | Freq: Every day | ORAL | 3 refills | Status: DC

## 2018-05-30 MED ORDER — INSULIN STARTER KIT- PEN NEEDLES (ENGLISH)
1.0000 | Freq: Once | Status: DC
  Filled 2018-05-30: qty 1

## 2018-05-30 MED ORDER — HYDROCODONE-ACETAMINOPHEN 5-325 MG PO TABS: 1.0000 | ORAL_TABLET | Freq: Four times a day (QID) | ORAL | 0 refills | Status: DC | PRN

## 2018-05-30 MED ORDER — DARBEPOETIN ALFA 100 MCG/0.5ML IJ SOSY
100.0000 ug | PREFILLED_SYRINGE | INTRAMUSCULAR | Status: DC
  Filled 2018-05-30 (×2): qty 0.5

## 2018-05-30 MED ORDER — SAXAGLIPTIN HCL 5 MG PO TABS: 5.0000 mg | ORAL_TABLET | Freq: Every day | ORAL | 2 refills | Status: DC

## 2018-05-30 MED ORDER — DARBEPOETIN ALFA 100 MCG/0.5ML IJ SOSY
100.0000 ug | PREFILLED_SYRINGE | INTRAMUSCULAR | Status: DC
  Filled 2018-05-30: qty 0.5

## 2018-05-30 MED ORDER — INSULIN STARTER KIT- PEN NEEDLES (ENGLISH)
1.0000 | Freq: Once | Status: AC
  Administered 2018-05-30: 1
  Filled 2018-05-30: qty 1

## 2018-05-30 MED ORDER — CARVEDILOL 12.5 MG PO TABS: 12.5000 mg | ORAL_TABLET | Freq: Two times a day (BID) | ORAL | 1 refills | Status: DC

## 2018-05-30 MED ORDER — DARBEPOETIN ALFA 100 MCG/0.5ML IJ SOSY
100.0000 ug | PREFILLED_SYRINGE | INTRAMUSCULAR | Status: DC
  Administered 2018-05-30: 100 ug via SUBCUTANEOUS
  Filled 2018-05-30: qty 0.5

## 2018-05-30 MED ORDER — INSULIN DETEMIR 100 UNIT/ML FLEXPEN: 8.0000 [IU] | Freq: Every day | SUBCUTANEOUS | 1 refills | Status: DC

## 2018-05-30 MED ORDER — UNABLE TO FIND: 0 refills | Status: DC

## 2018-05-30 MED ORDER — CARVEDILOL 6.25 MG PO TABS: 6.2500 mg | ORAL_TABLET | Freq: Two times a day (BID) | ORAL | 2 refills | Status: DC

## 2018-05-30 MED ORDER — PREDNISONE 20 MG PO TABS: 20.0000 mg | ORAL_TABLET | Freq: Every day | ORAL | 0 refills | Status: DC

## 2018-05-30 MED ORDER — BLOOD GLUCOSE METER KIT: PACK | 3 refills | Status: DC

## 2018-05-30 MED ORDER — ACETAMINOPHEN 325 MG PO TABS: 650.0000 mg | ORAL_TABLET | Freq: Four times a day (QID) | ORAL | 1 refills | Status: DC | PRN

## 2018-05-30 NOTE — Discharge Instructions (Signed)
1) follow-up with nephrologist Dr. Marval Regal within a week for repeat blood work (CBC and BMP test) and reevaluation of your kidney function 2) you have been given a note to be off work until Saturday 06/05/2018 3)Avoid ibuprofen/Advil/Aleve/Motrin/Goody Powders/Naproxen/BC powders/Meloxicam/Diclofenac/Indomethacin and other Nonsteroidal anti-inflammatory medications as these will make you more likely to bleed and can cause stomach ulcers, can also cause Kidney problems.  4)Take  prednisone as prescribed for your left knee pain, if left knee pain persist after you have completed prednisone then please follow-up with your primary care doctor for referral to orthopedic surgeon for further evaluation of your left knee pain to exclude possible ligamentous/meniscus injury possibly with an MRI of the Left Knee 5) Take Levemir insulin 8 units at bedtime 6) low-salt diet advised 7) you will need to get weekly or biweekly shots at the nephrologist office due to your anemia 8) please note to your lisinopril, metformin and other medications have been discontinued due to kidney concerns

## 2018-05-30 NOTE — Progress Notes (Signed)
Patient given discharge instructions and all questions answered. Patient taught insulin pen injection education with handout from pharmacy,  Videos, and teach back.

## 2018-05-30 NOTE — Discharge Summary (Addendum)
Colleen [name], is a 57 y.o. female  DOB Nov 07, 1961  MRN [id number].  Admission date:  05/27/2018  Admitting Physician  Rise Patience, MD  Discharge Date:  05/30/2018   Primary MD  Maury Dus, MD  Recommendations for primary care physician for things to follow:   1) follow-up with nephrologist Dr. Marval Regal within a week for repeat blood work (CBC and BMP test) and reevaluation of your kidney function 2) you have been given a note to be off work until Saturday 06/05/2018 3)Avoid ibuprofen/Advil/Aleve/Motrin/Goody Powders/Naproxen/BC powders/Meloxicam/Diclofenac/Indomethacin and other Nonsteroidal anti-inflammatory medications as these will make you more likely to bleed and can cause stomach ulcers, can also cause Kidney problems.  4)Take  prednisone as prescribed for your left knee pain, if left knee pain persist after you have completed prednisone then please follow-up with your primary care doctor for referral to orthopedic surgeon for further evaluation of your left knee pain to exclude possible ligamentous/meniscus injury possibly with an MRI of the Left Knee 5) Take Levemir insulin 8 units at bedtime 6) low-salt diet advised 7) you will need to get weekly or biweekly shots at the nephrologist office due to your anemia 8) please note to your lisinopril, metformin and other medications have been discontinued due to kidney concerns Admission Diagnosis  sent by urgent care/possible DVT   Discharge Diagnosis  sent by urgent care/possible DVT    Principal Problem:   Swelling of lower extremity Active Problems:   DM (diabetes mellitus), type 2, uncontrolled (HCC)   HTN (hypertension)   CKD (chronic kidney disease) stage 4, GFR 15-29 ml/min (HCC)   Hypochromic microcytic anemia   Lower extremity edema      Past Medical History:  Diagnosis Date  . Chronic kidney disease   . Diabetes ([place])     . DVT (deep venous thrombosis) ([place])   . E-coli UTI   . PE (pulmonary embolism)   . 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 NO PAST SURGERIES         HPI  from the history and physical done on the day of admission:   Chief Complaint: Left lower extremity swelling.  HPI: Colleen [name] is a 57 y.o. female with history of hypertension, diabetes mellitus, chronic kidney disease, previous history of DVT/PE was referred to the ER for left lower extremity swelling concerning for DVT.  Patient states over the last 2 weeks patient has been noticing increasing pain and swelling in the left lower extremity.  Patient has been experiencing increasing left knee pain and gradually noted increasing swelling below that.  Denies any chest pain shortness of breath.  Had gone to urgent care center over there patient had x-rays done yesterday which was negative as per the patient for anything acute was advised to come to the ER for the work-up for possible DVT rule out.  ED Course: In the ER  patient's labs show creatinine of around 4 hemoglobin around 8 stool for occult blood was negative.  On exam patient's left leg is swollen below knee.  Patient also has tenderness in the left knee around the medial aspect.  It difficult to extend or flex.    Hospital Course:   Brief summary:- 57 y.o.femalewithhistory of hypertension, diabetes mellitus, chronic kidney disease, previous history of DVT/PE admitted 05/28/18 with worsening anemia, worsening renal function and left lower extremity pain and swelling  Plan:- 1)AKI----acute kidney injury on CKD stage -  IV   creatinine on admission= 4  ,   baseline creatinine = 2.5 (in March 2019, was 2.2 in August 2018)    , creatinine is now= 3.9      ,  Avoid nephrotoxic agents/dehydration/hypotension, discussed with Dr. Madelon Lips,  nephrology consult  appreciated, urine output remains good, patient will follow-up in clinic with nephrology service for further investigations and management of kidney disease.  Farxiga and lisinopril has been discontinued due to kidney concerns  2)Acute on chronic anemia--- Hgb is down to 7.4, occult blood is negative, received Aranesp 100 mcg x 1 on 05/30/2018, further EPO administration as per nephrologist as outpatient , nephrology service as ordered anemia work-up including iron studies  3)DM2- anticipate worsening glycemic control due to discontinuation of Farxiga and Prednisone use.  Wilder Glade discontinued due to kidney findings, last A1c was over 13 in 2014, no recent A1c available, start Levemir 8 units every afternoon, this may be titrated up, follow-up with PCP for further adjustment  4)HTN--stable, c/n amlodipine 10 mg daily and Coreg 6.25 mg twice daily, lisinopril discontinued due to kidney concerns  5)Possible UTI-initially treated with rocephin, , urine cultures noted with corynebacterium, no further antibiotics needed  6)Lt Leg/Knee Pain--- x-rays of the left knee without acute findings, lower extremity venous Dopplers without DVT, clinically suspect inflammatory arthritis possibly gout,  unable to use indomethacin or NSAIDs due to kidney concerns, overall improved with prednisone, Take  prednisone as prescribed for your left knee pain, if left knee pain persist after you have completed prednisone then please follow-up with your primary care doctor for referral to orthopedic surgeon for further evaluation of your left knee pain to exclude possible ligamentous/meniscus injury possibly with an MRI of the Left Knee    Code Status : Full   Disposition Plan  : home  Consults  : Nephrology   Discharge Condition: stable  Follow UP--- nephrologist   Diet and Activity recommendation:  As advised  Discharge Instructions     Discharge Instructions    Call MD for:  difficulty breathing,  headache or visual disturbances   Complete by:  As directed    Call MD for:  persistant dizziness or light-headedness   Complete by:  As directed    Call MD for:  persistant nausea and vomiting   Complete by:  As directed    Call MD for:  temperature >100.4   Complete by:  As directed    Diet - low sodium heart healthy   Complete by:  As directed    Diet Carb Modified   Complete by:  As directed    Discharge instructions   Complete by:  As directed    1) follow-up with nephrologist Dr. Marval Regal within a week for repeat blood work (CBC and BMP test) and reevaluation of your kidney function 2) you have been given a note to be off work until Saturday 06/05/2018 3)Avoid ibuprofen/Advil/Aleve/Motrin/Goody Powders/Naproxen/BC powders/Meloxicam/Diclofenac/Indomethacin and other Nonsteroidal anti-inflammatory  medications as these will make you more likely to bleed and can cause stomach ulcers, can also cause Kidney problems.  4)Take  prednisone as prescribed for your left knee pain, if left knee pain persist after you have completed prednisone then please follow-up with your primary care doctor for referral to orthopedic surgeon for further evaluation of your left knee pain to exclude possible ligamentous/meniscus injury possibly with an MRI of the Left Knee 5) Take Levemir insulin 8 units at bedtime 6) low-salt diet advised 7) you will need to get weekly or biweekly shots at the nephrologist office due to your anemia 8) please note to your lisinopril, metformin and other medications have been discontinued due to kidney concerns   Increase activity slowly   Complete by:  As directed         Discharge Medications     Allergies as of 05/30/2018   No Known Allergies     Medication List    STOP taking these medications   FARXIGA 10 MG Tabs tablet Generic drug:  dapagliflozin propanediol   KOMBIGLYZE XR 2.04-999 MG Tb24 Generic drug:  Saxagliptin-Metformin   lisinopril 5 MG  tablet Commonly known as:  PRINIVIL,ZESTRIL   rivaroxaban 20 MG Tabs tablet Commonly known as:  XARELTO     TAKE these medications   acetaminophen 325 MG tablet Commonly known as:  TYLENOL Take 2 tablets (650 mg total) by mouth every 6 (six) hours as needed for mild pain (or Fever >/= 101).   amLODipine 10 MG tablet Commonly known as:  NORVASC Take 1 tablet (10 mg total) by mouth daily.   atorvastatin 40 MG tablet Commonly known as:  LIPITOR Take 1 tablet (40 mg total) by mouth daily at 6 PM. What changed:  when to take this   blood glucose meter kit and supplies [employer] Prime or Dispense other brand based on patient and insurance preference. Use up to four times daily as directed. (FOR ICD-9 250.00, 250.01).   carvedilol 12.5 MG tablet Commonly known as:  COREG Take 1 tablet (12.5 mg total) by mouth 2 (two) times daily. What changed:    medication strength  how much to take   HYDROcodone-acetaminophen 5-325 MG tablet Commonly known as:  NORCO Take 1 tablet by mouth every 6 (six) hours as needed for moderate pain.   Insulin Detemir 100 UNIT/ML Pen Commonly known as:  LEVEMIR Inject 8 Units into the skin daily at 10 pm.   predniSONE 20 MG tablet Commonly known as:  DELTASONE Take 1 tablet (20 mg total) by mouth daily with breakfast.   saxagliptin HCl 5 MG Tabs tablet Commonly known as:  ONGLYZA Take 1 tablet (5 mg total) by mouth daily. For Diabetes   UNABLE TO FIND 1. Glucometer - 1 2. Glucometer strips - 1 box. 3. Lancets - 1 box       Major procedures and Radiology Reports - PLEASE review detailed and final reports for all details, in brief -   Dg Knee 1-2 Views Left  Result Date: 05/28/2018 CLINICAL DATA:  57 y/o  F; pain and edema in the left knee. EXAM: LEFT KNEE - 1-2 VIEW COMPARISON:  05/27/2018 left knee radiograph. FINDINGS: No evidence of fracture, dislocation, or joint effusion. Small inferior patellar enthesophyte. Stable mild medial  femorotibial compartment joint space narrowing. IMPRESSION: 1.  No acute fracture or dislocation identified. 2. Stable mild narrowing of the medial femorotibial compartment. Electronically Signed   By: Kristine Garbe M.D.   On: 05/28/2018  05:54   US Renal  Result Date: 05/29/2018 CLINICAL DATA:  Chronic kidney disease EXAM: RENAL / URINARY TRACT ULTRASOUND COMPLETE COMPARISON:  Ultrasound 07/23/2017 FINDINGS: Right Kidney: Length: 10.4 cm. Increased cortical echogenicity. No hydronephrosis or focal abnormality Left Kidney: Length: 9.3 cm. Increased cortical echogenicity. No hydronephrosis or focal abnormality. Bladder: Appears normal for degree of bladder distention. IMPRESSION: 1. Increased cortical echogenicity suggesting medical renal disease. No hydronephrosis. 2. Otherwise negative renal sonogram Electronically Signed   By: Donavan Foil M.D.   On: 05/29/2018 19:31    Micro Results    Recent Results (from the past 240 hour(s))  Culture, Urine     Status: Abnormal   Collection Time: 05/28/18  5:36 AM  Result Value Ref Range Status   Specimen Description URINE, RANDOM  Final   Special Requests NONE  Final   Culture (A)  Final    50,000 COLONIES/mL DIPHTHEROIDS(CORYNEBACTERIUM SPECIES) Standardized susceptibility testing for this organism is not available. Performed at [facility], [place] [address] [place], [place] [address]    Report Status 05/29/2018 FINAL  Final       Today   Subjective    Shakia Sebastiano today has no new complaints, left knee pain is better, voiding well, no fevers or chills, no chest pain palpitations or dizziness          Patient has been seen and examined prior to discharge   Objective   Blood pressure (!) 147/73, pulse (!) 106, temperature 97.7 F (36.5 C), temperature source Oral, resp. rate 18, height _0  (1.626 m), weight 72.5 kg (159 lb 12.8 oz), SpO2 96 %.   Intake/Output Summary (Last 24 hours) at 05/30/2018 1724 Last data  filed at 05/30/2018 0813 Gross per 24 hour  Intake 705.01 ml  Output 1500 ml  Net -794.99 ml    Exam Gen:- Awake Alert,  In no apparent distress  HEENT:- [place].AT, No sclera icterus Neck-Supple Neck,No JVD,.  Lungs-  CTAB , good air movement CV- S1, S2 normal Abd-  +ve B.Sounds, Abd Soft, No tenderness,    Extremity/Skin:-Overall improved left knee and lower extremity swelling, left knee warmth and  tenderness also improved, no significant erythema or streaking  Psych-affect is appropriate, oriented x3 Neuro-no new focal deficits, patient has congenital facial droop/asymmetry, no tremors     Data Review   CBC w Diff:  Lab Results  Component Value Date   WBC 8.7 05/29/2018   HGB 7.4 (L) 05/30/2018   HCT 23.8 (L) 05/30/2018   PLT 252 05/29/2018   LYMPHOPCT 16 05/28/2018   MONOPCT 7 05/28/2018   EOSPCT 2 05/28/2018   BASOPCT 1 05/28/2018    CMP:  Lab Results  Component Value Date   NA 135 05/30/2018   K 3.9 05/30/2018   CL 108 05/30/2018   CO2 18 (L) 05/30/2018   BUN 49 (H) 05/30/2018   CREATININE 3.90 (H) 05/30/2018   PROT 6.5 05/28/2018   ALBUMIN 2.3 (L) 05/30/2018   BILITOT 0.2 (L) 05/28/2018   ALKPHOS 87 05/28/2018   AST 17 05/28/2018   ALT 10 (L) 05/28/2018  .   Total Discharge time is about 33 minutes  Roxan Hockey M.D on 05/30/2018 at 5:24 PM  [facility]   Office  [phone]  Voice Recognition Viviann Spare dictation system was used to create this note, attempts have been made to correct errors. Please contact the author with questions and/or clarifications.

## 2018-05-30 NOTE — Progress Notes (Signed)
[facility] Progress Note    Assessment/ Plan:   1.  AKI on CKD: Baseline creatinine 2.5 02/2018 attributed to DM and HTN.  Up to 4.10--> 3.8-3.9.  UA with > 300 mg protein, > 500 glucose, 11-20 squames, 6-10 WBCs.  Still taking Wilder Glade which is contraindicated in eGFR < 45 and could cause AKI.  Stop gliflozin, stop lisinopril for now.  UP/C 6.9 which is on par with 24 hour urine protein at 5.9 g from 02/2018.  Serum free light chains, SPEP pending  Making good urine.  Don't have a strong suspicion for GN right now so don't think needs a serologic workup or biopsy at this point.  Repeat US with medicorenal disease.  We appear to be in the plateau phase of AKI on CKD vs progressive CKD.  I think she can go home today with close f/u in our clinic.  I don't think she would need a biopsy or a repeat serologic worku at this point; will arrange f/u with Dr. C in the next 1-2 weeks.  2.  LLE/ knee pain: DVT negative.  Uric acid 6.4. On pred taper  3.  Anemia: down to 7.5.  Send iron studies.  Pt denies overt bleeding, FOBT negative.  Aranesp 100 mcg today.    4.  HTN: coreg and amlodipine- hold lisinopril  5.  Dispo: OK from my standpoint go to today with close nephrology f/u.    Subjective:    Feeling OK.  Hgb 7.0, Getting aranesp.  Cr essentially plateaued.  UP/c 6.9g,  24 hr urine protein from 02/2018 as an outpatient with 5.9 g.     Objective:   BP (!) 147/73 (BP Location: Right Arm)   Pulse (!) 106   Temp 97.7 F (36.5 C) (Oral)   Resp 18   Ht _0  (1.626 m)   Wt 72.5 kg (159 lb 12.8 oz)   SpO2 96%   BMI 27.43 kg/m   Intake/Output Summary (Last 24 hours) at 05/30/2018 1405 Last data filed at 05/30/2018 0813 Gross per 24 hour  Intake 705.01 ml  Output 2100 ml  Net -1394.99 ml   Weight change: 0.907 kg (2 lb)  Physical Exam: GEN NAD HEENT EOMI PERRL MMM NECK no JVD PULM clear bilaterally  CV RRR no m/r/g ABD soft nontender NABS EXT no LE edema, L knee much  better. NEURO AAO x 3 SKIN no skin rashes or lesions  Imaging: US Renal  Result Date: 05/29/2018 CLINICAL DATA:  Chronic kidney disease EXAM: RENAL / URINARY TRACT ULTRASOUND COMPLETE COMPARISON:  Ultrasound 07/23/2017 FINDINGS: Right Kidney: Length: 10.4 cm. Increased cortical echogenicity. No hydronephrosis or focal abnormality Left Kidney: Length: 9.3 cm. Increased cortical echogenicity. No hydronephrosis or focal abnormality. Bladder: Appears normal for degree of bladder distention. IMPRESSION: 1. Increased cortical echogenicity suggesting medical renal disease. No hydronephrosis. 2. Otherwise negative renal sonogram Electronically Signed   By: Donavan Foil M.D.   On: 05/29/2018 19:31    Labs: BMET Recent Labs  Lab 05/27/18 2053 05/28/18 0726 05/29/18 1051 05/30/18 0739  NA 140 141 141 135  K 4.3 3.9 3.9 3.9  CL 112* 115* 113* 108  CO2 20* 20* 21* 18*  GLUCOSE 155* 207* 191* 313*  BUN 39* 37* 43* 49*  CREATININE 4.10* 3.75* 3.78* 3.90*  CALCIUM 9.2 8.3* 8.6* 9.1  PHOS  --  4.2 5.3* 4.4   CBC Recent Labs  Lab 05/27/18 2053 05/28/18 0726 05/29/18 1051  WBC 9.7 8.7 8.7  NEUTROABS  --  6.5  --   HGB 8.8* 7.5* 7.0*  HCT 29.2* 24.4* 22.7*  MCV 74.5* 73.3* 73.5*  PLT 332 283 252    Medications:    . amLODipine  10 mg Oral Daily  . atorvastatin  40 mg Oral Daily  . carvedilol  6.25 mg Oral BID WC  . darbepoetin (ARANESP) injection - NON-DIALYSIS  100 mcg Subcutaneous Q Sun-1800  . heparin  5,000 Units Subcutaneous Q8H  . insulin aspart  0-15 Units Subcutaneous TID WC  . insulin aspart  0-5 Units Subcutaneous QHS  . [START ON 05/31/2018] predniSONE  40 mg Oral Q breakfast      Madelon Lips MD 05/30/2018, 2:05 PM

## 2018-05-31 ENCOUNTER — Other Ambulatory Visit: Payer: Self-pay

## 2018-05-31 ENCOUNTER — Emergency Department (HOSPITAL_COMMUNITY)
Admission: EM | Admit: 2018-05-31 | Discharge: 2018-06-01 | Disposition: A | Discharge status: Home / Self Care | Payer: Federal, State, Local not specified - PPO | Attending: Emergency Medicine | Admitting: Emergency Medicine

## 2018-05-31 ENCOUNTER — Encounter (HOSPITAL_COMMUNITY): Payer: Self-pay | Admitting: Emergency Medicine

## 2018-05-31 DIAGNOSIS — Z79899 Other long term (current) drug therapy: Secondary | ICD-10-CM | POA: Diagnosis not present

## 2018-05-31 DIAGNOSIS — N184 Chronic kidney disease, stage 4 (severe): Secondary | ICD-10-CM | POA: Insufficient documentation

## 2018-05-31 DIAGNOSIS — Z794 Long term (current) use of insulin: Secondary | ICD-10-CM | POA: Insufficient documentation

## 2018-05-31 DIAGNOSIS — Z86711 Personal history of pulmonary embolism: Secondary | ICD-10-CM | POA: Insufficient documentation

## 2018-05-31 DIAGNOSIS — I129 Hypertensive chronic kidney disease with stage 1 through stage 4 chronic kidney disease, or unspecified chronic kidney disease: Secondary | ICD-10-CM | POA: Diagnosis not present

## 2018-05-31 DIAGNOSIS — E1165 Type 2 diabetes mellitus with hyperglycemia: Secondary | ICD-10-CM | POA: Diagnosis not present

## 2018-05-31 DIAGNOSIS — E1122 Type 2 diabetes mellitus with diabetic chronic kidney disease: Secondary | ICD-10-CM | POA: Insufficient documentation

## 2018-05-31 DIAGNOSIS — R739 Hyperglycemia, unspecified: Secondary | ICD-10-CM

## 2018-05-31 LAB — BASIC METABOLIC PANEL
Anion gap: 9 (ref 5–15)
BUN: 66 mg/dL — ABNORMAL HIGH (ref 6–20)
CO2: 18 mmol/L — ABNORMAL LOW (ref 22–32)
Calcium: 8.9 mg/dL (ref 8.9–10.3)
Chloride: 107 mmol/L (ref 101–111)
Creatinine, Ser: 4.17 mg/dL — ABNORMAL HIGH (ref 0.44–1.00)
GFR calc Af Amer: 13 mL/min — ABNORMAL LOW (ref >60)
GFR calc non Af Amer: 11 mL/min — ABNORMAL LOW (ref >60)
Glucose, Bld: 457 mg/dL — ABNORMAL HIGH (ref 65–99)
Potassium: 4.3 mmol/L (ref 3.5–5.1)
Sodium: 134 mmol/L — ABNORMAL LOW (ref 135–145)

## 2018-05-31 LAB — URINALYSIS, ROUTINE W REFLEX MICROSCOPIC
Bilirubin Urine: NEGATIVE
Glucose, UA: >=500 mg/dL — AB
Ketones, ur: NEGATIVE mg/dL
Nitrite: NEGATIVE
Protein, ur: >=300 mg/dL — AB
Specific Gravity, Urine: 1.014 (ref 1.005–1.030)
pH: 5 (ref 5.0–8.0)

## 2018-05-31 LAB — CBC
HCT: 27.7 % — ABNORMAL LOW (ref 36.0–46.0)
Hemoglobin: 8.6 g/dL — ABNORMAL LOW (ref 12.0–15.0)
MCH: 22.7 pg — ABNORMAL LOW (ref 26.0–34.0)
MCHC: 31 g/dL (ref 30.0–36.0)
MCV: 73.1 fL — ABNORMAL LOW (ref 78.0–100.0)
Platelets: 306 10*3/uL (ref 150–400)
RBC: 3.79 MIL/uL — ABNORMAL LOW (ref 3.87–5.11)
RDW: 16.1 % — ABNORMAL HIGH (ref 11.5–15.5)
WBC: 13.7 10*3/uL — ABNORMAL HIGH (ref 4.0–10.5)

## 2018-05-31 LAB — PROTEIN ELECTROPHORESIS, SERUM
A/G Ratio: 0.7 (ref 0.7–1.7)
Albumin ELP: 2.6 g/dL — ABNORMAL LOW (ref 2.9–4.4)
Alpha-1-Globulin: 0.2 g/dL (ref 0.0–0.4)
Alpha-2-Globulin: 1 g/dL (ref 0.4–1.0)
Beta Globulin: 1.1 g/dL (ref 0.7–1.3)
Gamma Globulin: 1.6 g/dL (ref 0.4–1.8)
Globulin, Total: 3.9 g/dL (ref 2.2–3.9)
Total Protein ELP: 6.5 g/dL (ref 6.0–8.5)

## 2018-05-31 LAB — CBG MONITORING, ED
Glucose-Capillary: 426 mg/dL — ABNORMAL HIGH (ref 65–99)
Glucose-Capillary: 398 mg/dL — ABNORMAL HIGH (ref 65–99)

## 2018-05-31 LAB — KAPPA/LAMBDA LIGHT CHAINS
Kappa free light chain: 154.7 mg/L — ABNORMAL HIGH (ref 3.3–19.4)
Kappa, lambda light chain ratio: 3.41 — ABNORMAL HIGH (ref 0.26–1.65)
Lambda free light chains: 45.4 mg/L — ABNORMAL HIGH (ref 5.7–26.3)

## 2018-05-31 NOTE — ED Notes (Signed)
Pt requesting to have her sugar checked, reporting it is now 547 according to her personal device.

## 2018-05-31 NOTE — ED Triage Notes (Signed)
Pt reports she was discharged from the hospital after being taken off of a medication for diabetes. Pt was given a steroid for her knee and insulin injection to start at night. Pt states her CBG was noted to be 500 after taking her injection tonight.

## 2018-06-01 LAB — CBG MONITORING, ED
Glucose-Capillary: 381 mg/dL — ABNORMAL HIGH (ref 70–99)
Glucose-Capillary: 351 mg/dL — ABNORMAL HIGH (ref 70–99)

## 2018-06-01 MED ORDER — INSULIN ASPART 100 UNIT/ML ~~LOC~~ SOLN
10.0000 [IU] | Freq: Once | SUBCUTANEOUS | Status: AC
  Administered 2018-06-01: 10 [IU] via SUBCUTANEOUS
  Filled 2018-06-01: qty 1

## 2018-06-01 NOTE — ED Notes (Signed)
Patient left at this time with all belongings. 

## 2018-06-01 NOTE — ED Provider Notes (Signed)
[facility] EMERGENCY DEPARTMENT Provider Note   CSN: [id number] Arrival date & time: 05/31/18  2138     History   Chief Complaint No chief complaint on file.   HPI Colleen [name] is a 57 y.o. female.  Patient presents to the emergency department with a chief complaint of hyperglycemia.  She states that she was seen recently for left knee pain.  She was placed on prednisone for possible gout.  She states that her blood sugar has been running high since taking the prednisone.  She reports that she was encouraged to take 8 units of subcutaneous insulin to help with the hypoglycemia.  She states that she did so earlier today, but her blood sugar was still running in the 500s.  She became concerned and came to the emergency department for further evaluation.  She denies any other symptoms.  She states that the plan is for her to follow-up with orthopedics regarding her knee.  She denies any improvement while taking the prednisone.  Denies any fevers or chills.  Denies any trauma to the knee.  The history is provided by the patient. No language interpreter was used.    Past Medical History:  Diagnosis Date  . Chronic kidney disease   . Diabetes ([place])   . DVT (deep venous thrombosis) ([place])   . E-coli UTI   . PE (pulmonary embolism)   . 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 Swelling of lower extremity 05/28/2018  . CKD (chronic kidney disease) stage 4, GFR 15-29 ml/min (HCC) 05/28/2018  . Hypochromic microcytic anemia 05/28/2018  . Lower extremity edema 05/28/2018  . HTN (hypertension) 10/06/2013  . Acute pulmonary embolism ([place]) 10/06/2013  . Congestive dilated cardiomyopathy ([place]) 10/05/2013  . Carotid bruit 10/05/2013  . DM (diabetes mellitus), type 2, uncontrolled ([place]) 10/04/2013  . Acute on chronic systolic heart failure ([place]) 10/02/2013  .  E-coli UTI 10/02/2013  . ARDS (adult respiratory distress syndrome) ([place]) 09/27/2013  . Acute renal insufficiency 09/27/2013  . Acute respiratory failure with hypoxia ([place]) 09/25/2013  . Septic shock(785.52) 09/25/2013  . Bacteremia 09/25/2013    Past Surgical History:  Procedure Laterality Date  . NO PAST SURGERIES       OB History   None      Home Medications    Prior to Admission medications   Medication Sig Start Date End Date Taking? Authorizing Provider  acetaminophen (TYLENOL) 325 MG tablet Take 2 tablets (650 mg total) by mouth every 6 (six) hours as needed for mild pain (or Fever >/= 101). 05/30/18   Emokpae, Courage, MD  amLODipine (NORVASC) 10 MG tablet Take 1 tablet (10 mg total) by mouth daily. 05/30/18   Roxan Hockey, MD  atorvastatin (LIPITOR) 40 MG tablet Take 1 tablet (40 mg total) by mouth daily at 6 PM. 05/30/18   Roxan Hockey, MD  blood glucose meter kit and supplies Relion Prime or Dispense other brand based on patient and insurance preference. Use up to four times daily as directed. (FOR ICD-9 250.00, 250.01). 05/30/18   Roxan Hockey, MD  carvedilol (COREG) 12.5 MG tablet Take 1 tablet (12.5 mg total) by mouth 2 (two) times daily. 05/30/18   Roxan Hockey, MD  HYDROcodone-acetaminophen (NORCO) 5-325 MG tablet Take 1 tablet by mouth every 6 (six) hours as needed for moderate pain. 05/30/18  Roxan Hockey, MD  Insulin Detemir (LEVEMIR) 100 UNIT/ML Pen Inject 8 Units into the skin daily at 10 pm. 05/30/18   Roxan Hockey, MD  predniSONE (DELTASONE) 20 MG tablet Take 1 tablet (20 mg total) by mouth daily with breakfast. 05/30/18   Denton Brick, Courage, MD  saxagliptin HCl (ONGLYZA) 5 MG TABS tablet Take 1 tablet (5 mg total) by mouth daily. For Diabetes 05/30/18   Roxan Hockey, MD  UNABLE TO FIND 1. Glucometer - 1 2. Glucometer strips - 1 box. 3. Lancets - 1 box 05/30/18   Roxan Hockey, MD    Family History Family History  Problem Relation Age  of Onset  . CAD Neg Hx     Social History Social History   Tobacco Use  . Smoking status: Never Smoker  . Smokeless tobacco: Never Used  Substance Use Topics  . Alcohol use: No  . Drug use: No     Allergies   Patient has no known allergies.   Review of Systems Review of Systems  All other systems reviewed and are negative.    Physical Exam Updated Vital Signs BP (!) 158/80 (BP Location: Left Arm)   Pulse 98   Temp 97.8 F (36.6 C) (Oral)   Resp 16   SpO2 100%   Physical Exam  Constitutional: She is oriented to person, place, and time. She appears well-developed and well-nourished.  HENT:  Head: Normocephalic and atraumatic.  Eyes: Conjunctivae and EOM are normal.  Neck: Normal range of motion.  Cardiovascular: Normal rate.  Pulmonary/Chest: Effort normal.  Abdominal: She exhibits no distension.  Musculoskeletal: Normal range of motion.  Left knee mildly swollen, mildly tender to palpation over the superior medial aspect, no bony abnormality or deformity, no palpable effusion, no erythema  Neurological: She is alert and oriented to person, place, and time.  Skin: Skin is dry.  Psychiatric: She has a normal mood and affect. Her behavior is normal. Judgment and thought content normal.  Nursing note and vitals reviewed.    ED Treatments / Results  Labs (all labs ordered are listed, but only abnormal results are displayed) Labs Reviewed  BASIC METABOLIC PANEL - Abnormal; Notable for the following components:      Result Value   Sodium 134 (*)    CO2 18 (*)    Glucose, Bld 457 (*)    BUN 66 (*)    Creatinine, Ser 4.17 (*)    GFR calc non Af Amer 11 (*)    GFR calc Af Amer 13 (*)    All other components within normal limits  CBC - Abnormal; Notable for the following components:   WBC 13.7 (*)    RBC 3.79 (*)    Hemoglobin 8.6 (*)    HCT 27.7 (*)    MCV 73.1 (*)    MCH 22.7 (*)    RDW 16.1 (*)    All other components within normal limits    URINALYSIS, ROUTINE W REFLEX MICROSCOPIC - Abnormal; Notable for the following components:   Color, Urine STRAW (*)    Glucose, UA >=500 (*)    Hgb urine dipstick SMALL (*)    Protein, ur >=300 (*)    Leukocytes, UA TRACE (*)    Bacteria, UA RARE (*)    All other components within normal limits  CBG MONITORING, ED - Abnormal; Notable for the following components:   Glucose-Capillary 426 (*)    All other components within normal limits  CBG MONITORING, ED - Abnormal; Notable for  the following components:   Glucose-Capillary 398 (*)    All other components within normal limits  CBG MONITORING, ED - Abnormal; Notable for the following components:   Glucose-Capillary 381 (*)    All other components within normal limits    EKG None  Radiology No results found.  Procedures Procedures (including critical care time)  Medications Ordered in ED Medications  insulin aspart (novoLOG) injection 10 Units (has no administration in time range)     Initial Impression / Assessment and Plan / ED Course  I have reviewed the triage vital signs and the nursing notes.  Pertinent labs & imaging results that were available during my care of the patient were reviewed by me and considered in my medical decision making (see chart for details).     Patient with hyperglycemia.  Likely secondary to prednisone use.  Patient is taking prednisone for possible gout, but she reports no improvement over the past few days.  She would like to discontinue the prednisone.  I think this is reasonable, she has had no improvement.  She is planning to follow-up with orthopedics to rule out internal derangement of her knee.  Recommend that she continue to check her blood sugars.  We will give 10 units subcutaneous insulin, and reassess.  Glucose is trending down.  She does not have an elevated anion gap.  Recommend PCP follow-up.  Final Clinical Impressions(s) / ED Diagnoses   Final diagnoses:  Hyperglycemia     ED Discharge Orders    None       Montine Circle, PA-C 06/01/18 0246    Merryl Hacker, MD 06/01/18 [phone]

## 2018-06-01 NOTE — ED Notes (Signed)
PA Rob at bedside

## 2018-06-01 NOTE — Discharge Instructions (Signed)
Please call your primary care doctor and ask them about your diabetic regimen.  Please stop taking the prednisone as we discussed.  Please follow-up with an orthopedic doctor.

## 2018-06-02 DIAGNOSIS — N184 Chronic kidney disease, stage 4 (severe): Secondary | ICD-10-CM | POA: Diagnosis not present

## 2018-06-02 DIAGNOSIS — D638 Anemia in other chronic diseases classified elsewhere: Secondary | ICD-10-CM | POA: Diagnosis not present

## 2018-06-02 DIAGNOSIS — E1165 Type 2 diabetes mellitus with hyperglycemia: Secondary | ICD-10-CM | POA: Diagnosis not present

## 2018-06-02 DIAGNOSIS — Z09 Encounter for follow-up examination after completed treatment for conditions other than malignant neoplasm: Secondary | ICD-10-CM | POA: Diagnosis not present

## 2018-06-02 DIAGNOSIS — I10 Essential (primary) hypertension: Secondary | ICD-10-CM | POA: Diagnosis not present

## 2018-06-02 DIAGNOSIS — E7800 Pure hypercholesterolemia, unspecified: Secondary | ICD-10-CM | POA: Diagnosis not present

## 2018-06-03 DIAGNOSIS — M25562 Pain in left knee: Secondary | ICD-10-CM | POA: Diagnosis not present

## 2018-06-03 DIAGNOSIS — M1712 Unilateral primary osteoarthritis, left knee: Secondary | ICD-10-CM | POA: Diagnosis not present

## 2018-06-05 ENCOUNTER — Observation Stay (HOSPITAL_COMMUNITY)
Admission: EM | Admit: 2018-06-05 | Discharge: 2018-06-07 | Disposition: A | Discharge status: Home / Self Care | Payer: Federal, State, Local not specified - PPO | Attending: Internal Medicine | Admitting: Internal Medicine

## 2018-06-05 ENCOUNTER — Encounter (HOSPITAL_COMMUNITY): Payer: Self-pay | Admitting: Emergency Medicine

## 2018-06-05 ENCOUNTER — Other Ambulatory Visit: Payer: Self-pay

## 2018-06-05 ENCOUNTER — Emergency Department (HOSPITAL_COMMUNITY): Payer: Federal, State, Local not specified - PPO

## 2018-06-05 DIAGNOSIS — N189 Chronic kidney disease, unspecified: Secondary | ICD-10-CM | POA: Diagnosis not present

## 2018-06-05 DIAGNOSIS — Z87440 Personal history of urinary (tract) infections: Secondary | ICD-10-CM | POA: Diagnosis not present

## 2018-06-05 DIAGNOSIS — J9811 Atelectasis: Secondary | ICD-10-CM | POA: Insufficient documentation

## 2018-06-05 DIAGNOSIS — J9601 Acute respiratory failure with hypoxia: Secondary | ICD-10-CM | POA: Diagnosis not present

## 2018-06-05 DIAGNOSIS — I5023 Acute on chronic systolic (congestive) heart failure: Secondary | ICD-10-CM | POA: Insufficient documentation

## 2018-06-05 DIAGNOSIS — I120 Hypertensive chronic kidney disease with stage 5 chronic kidney disease or end stage renal disease: Secondary | ICD-10-CM | POA: Diagnosis not present

## 2018-06-05 DIAGNOSIS — R739 Hyperglycemia, unspecified: Secondary | ICD-10-CM | POA: Diagnosis not present

## 2018-06-05 DIAGNOSIS — N179 Acute kidney failure, unspecified: Secondary | ICD-10-CM | POA: Diagnosis not present

## 2018-06-05 DIAGNOSIS — IMO0002 Reserved for concepts with insufficient information to code with codable children: Secondary | ICD-10-CM | POA: Diagnosis present

## 2018-06-05 DIAGNOSIS — D649 Anemia, unspecified: Secondary | ICD-10-CM

## 2018-06-05 DIAGNOSIS — M109 Gout, unspecified: Secondary | ICD-10-CM | POA: Diagnosis present

## 2018-06-05 DIAGNOSIS — E785 Hyperlipidemia, unspecified: Secondary | ICD-10-CM | POA: Diagnosis not present

## 2018-06-05 DIAGNOSIS — Z86711 Personal history of pulmonary embolism: Secondary | ICD-10-CM | POA: Diagnosis not present

## 2018-06-05 DIAGNOSIS — E1165 Type 2 diabetes mellitus with hyperglycemia: Principal | ICD-10-CM | POA: Insufficient documentation

## 2018-06-05 DIAGNOSIS — Z8249 Family history of ischemic heart disease and other diseases of the circulatory system: Secondary | ICD-10-CM | POA: Insufficient documentation

## 2018-06-05 DIAGNOSIS — I10 Essential (primary) hypertension: Secondary | ICD-10-CM | POA: Diagnosis present

## 2018-06-05 DIAGNOSIS — I130 Hypertensive heart and chronic kidney disease with heart failure and stage 1 through stage 4 chronic kidney disease, or unspecified chronic kidney disease: Secondary | ICD-10-CM | POA: Diagnosis not present

## 2018-06-05 DIAGNOSIS — M25562 Pain in left knee: Secondary | ICD-10-CM | POA: Diagnosis present

## 2018-06-05 DIAGNOSIS — E11649 Type 2 diabetes mellitus with hypoglycemia without coma: Secondary | ICD-10-CM | POA: Insufficient documentation

## 2018-06-05 DIAGNOSIS — N184 Chronic kidney disease, stage 4 (severe): Secondary | ICD-10-CM | POA: Diagnosis not present

## 2018-06-05 DIAGNOSIS — Z794 Long term (current) use of insulin: Secondary | ICD-10-CM | POA: Insufficient documentation

## 2018-06-05 DIAGNOSIS — R7881 Bacteremia: Secondary | ICD-10-CM | POA: Insufficient documentation

## 2018-06-05 DIAGNOSIS — E871 Hypo-osmolality and hyponatremia: Secondary | ICD-10-CM | POA: Diagnosis present

## 2018-06-05 DIAGNOSIS — I420 Dilated cardiomyopathy: Secondary | ICD-10-CM | POA: Insufficient documentation

## 2018-06-05 DIAGNOSIS — Z86718 Personal history of other venous thrombosis and embolism: Secondary | ICD-10-CM | POA: Diagnosis not present

## 2018-06-05 DIAGNOSIS — D631 Anemia in chronic kidney disease: Secondary | ICD-10-CM | POA: Diagnosis not present

## 2018-06-05 DIAGNOSIS — N185 Chronic kidney disease, stage 5: Secondary | ICD-10-CM | POA: Diagnosis present

## 2018-06-05 DIAGNOSIS — Z79899 Other long term (current) drug therapy: Secondary | ICD-10-CM | POA: Insufficient documentation

## 2018-06-05 DIAGNOSIS — M7989 Other specified soft tissue disorders: Secondary | ICD-10-CM | POA: Diagnosis not present

## 2018-06-05 DIAGNOSIS — E1122 Type 2 diabetes mellitus with diabetic chronic kidney disease: Secondary | ICD-10-CM | POA: Insufficient documentation

## 2018-06-05 DIAGNOSIS — E872 Acidosis: Secondary | ICD-10-CM | POA: Insufficient documentation

## 2018-06-05 HISTORY — DX: Hyperlipidemia, unspecified: E78.5

## 2018-06-05 HISTORY — DX: Gout, unspecified: M10.9

## 2018-06-05 LAB — I-STAT VENOUS BLOOD GAS, ED
Acid-base deficit: 10 mmol/L — ABNORMAL HIGH (ref 0.0–2.0)
Bicarbonate: 15.8 mmol/L — ABNORMAL LOW (ref 20.0–28.0)
O2 Saturation: 92 %
TCO2: 17 mmol/L — ABNORMAL LOW (ref 22–32)
pCO2, Ven: 32.8 mmHg — ABNORMAL LOW (ref 44.0–60.0)
pH, Ven: 7.29 (ref 7.250–7.430)
pO2, Ven: 69 mmHg — ABNORMAL HIGH (ref 32.0–45.0)

## 2018-06-05 LAB — BASIC METABOLIC PANEL
Anion gap: 10 (ref 5–15)
BUN: 64 mg/dL — ABNORMAL HIGH (ref 6–20)
CO2: 17 mmol/L — ABNORMAL LOW (ref 22–32)
Calcium: 8.5 mg/dL — ABNORMAL LOW (ref 8.9–10.3)
Chloride: 99 mmol/L (ref 98–111)
Creatinine, Ser: 4.24 mg/dL — ABNORMAL HIGH (ref 0.44–1.00)
GFR calc Af Amer: 13 mL/min — ABNORMAL LOW (ref >60)
GFR calc non Af Amer: 11 mL/min — ABNORMAL LOW (ref >60)
Glucose, Bld: 846 mg/dL (ref 70–99)
Potassium: 4.9 mmol/L (ref 3.5–5.1)
Sodium: 126 mmol/L — ABNORMAL LOW (ref 135–145)

## 2018-06-05 LAB — CBG MONITORING, ED
Glucose-Capillary: >600 mg/dL (ref 70–99)
Glucose-Capillary: >600 mg/dL (ref 70–99)
Glucose-Capillary: >600 mg/dL (ref 70–99)
Glucose-Capillary: 573 mg/dL (ref 70–99)
Glucose-Capillary: 490 mg/dL — ABNORMAL HIGH (ref 70–99)

## 2018-06-05 LAB — URINALYSIS, ROUTINE W REFLEX MICROSCOPIC
Bacteria, UA: NONE SEEN
Bilirubin Urine: NEGATIVE
Glucose, UA: >=500 mg/dL — AB
Ketones, ur: NEGATIVE mg/dL
Leukocytes, UA: NEGATIVE
Nitrite: NEGATIVE
Protein, ur: >=300 mg/dL — AB
Specific Gravity, Urine: 1.016 (ref 1.005–1.030)
pH: 6 (ref 5.0–8.0)

## 2018-06-05 LAB — GLUCOSE, CAPILLARY
Glucose-Capillary: 385 mg/dL — ABNORMAL HIGH (ref 70–99)
Glucose-Capillary: 393 mg/dL — ABNORMAL HIGH (ref 70–99)
Glucose-Capillary: 310 mg/dL — ABNORMAL HIGH (ref 70–99)
Glucose-Capillary: 268 mg/dL — ABNORMAL HIGH (ref 70–99)
Glucose-Capillary: 217 mg/dL — ABNORMAL HIGH (ref 70–99)
Glucose-Capillary: 163 mg/dL — ABNORMAL HIGH (ref 70–99)
Glucose-Capillary: 126 mg/dL — ABNORMAL HIGH (ref 70–99)

## 2018-06-05 LAB — CBC
HCT: 28.3 % — ABNORMAL LOW (ref 36.0–46.0)
Hemoglobin: 8.7 g/dL — ABNORMAL LOW (ref 12.0–15.0)
MCH: 22.7 pg — ABNORMAL LOW (ref 26.0–34.0)
MCHC: 30.7 g/dL (ref 30.0–36.0)
MCV: 73.9 fL — ABNORMAL LOW (ref 78.0–100.0)
Platelets: 307 10*3/uL (ref 150–400)
RBC: 3.83 MIL/uL — ABNORMAL LOW (ref 3.87–5.11)
RDW: 17.2 % — ABNORMAL HIGH (ref 11.5–15.5)
WBC: 15.6 10*3/uL — ABNORMAL HIGH (ref 4.0–10.5)

## 2018-06-05 LAB — I-STAT BETA HCG BLOOD, ED (MC, WL, AP ONLY): I-stat hCG, quantitative: 5.9 m[IU]/mL — ABNORMAL HIGH (ref <5)

## 2018-06-05 MED ORDER — SODIUM CHLORIDE 0.9 % IV SOLN: 250.0000 mL | INTRAVENOUS | Status: DC | PRN

## 2018-06-05 MED ORDER — INSULIN DETEMIR 100 UNIT/ML ~~LOC~~ SOLN
8.0000 [IU] | Freq: Every day | SUBCUTANEOUS | Status: DC
  Filled 2018-06-05: qty 0.08

## 2018-06-05 MED ORDER — CARVEDILOL 12.5 MG PO TABS
12.5000 mg | ORAL_TABLET | Freq: Two times a day (BID) | ORAL | Status: DC
  Administered 2018-06-05 – 2018-06-07 (×5): 12.5 mg via ORAL
  Filled 2018-06-05 (×5): qty 1

## 2018-06-05 MED ORDER — INSULIN DETEMIR 100 UNIT/ML ~~LOC~~ SOLN
8.0000 [IU] | Freq: Every day | SUBCUTANEOUS | Status: DC
  Administered 2018-06-06 (×2): 8 [IU] via SUBCUTANEOUS
  Filled 2018-06-05 (×3): qty 0.08

## 2018-06-05 MED ORDER — INSULIN REGULAR HUMAN 100 UNIT/ML IJ SOLN
INTRAMUSCULAR | Status: DC
  Administered 2018-06-05: 5.4 [IU]/h via INTRAVENOUS
  Filled 2018-06-05: qty 1

## 2018-06-05 MED ORDER — SODIUM CHLORIDE 0.9% FLUSH: 3.0000 mL | INTRAVENOUS | Status: DC | PRN

## 2018-06-05 MED ORDER — INSULIN ASPART 100 UNIT/ML ~~LOC~~ SOLN: 0.0000 [IU] | Freq: Three times a day (TID) | SUBCUTANEOUS | Status: DC

## 2018-06-05 MED ORDER — SODIUM CHLORIDE 0.9 % IV SOLN
INTRAVENOUS | Status: DC
  Administered 2018-06-05: 14:00:00 via INTRAVENOUS

## 2018-06-05 MED ORDER — SODIUM CHLORIDE 0.9 % IV BOLUS
1000.0000 mL | Freq: Once | INTRAVENOUS | Status: AC
  Administered 2018-06-05: 1000 mL via INTRAVENOUS

## 2018-06-05 MED ORDER — POLYETHYLENE GLYCOL 3350 17 G PO PACK: 17.0000 g | PACK | Freq: Every day | ORAL | Status: DC | PRN

## 2018-06-05 MED ORDER — INSULIN ASPART 100 UNIT/ML ~~LOC~~ SOLN
0.0000 [IU] | SUBCUTANEOUS | Status: DC
  Administered 2018-06-06: 8 [IU] via SUBCUTANEOUS
  Administered 2018-06-06: 3 [IU] via SUBCUTANEOUS
  Administered 2018-06-06: 11 [IU] via SUBCUTANEOUS
  Administered 2018-06-06 (×2): 3 [IU] via SUBCUTANEOUS
  Administered 2018-06-07 (×2): 5 [IU] via SUBCUTANEOUS
  Administered 2018-06-07: 2 [IU] via SUBCUTANEOUS
  Administered 2018-06-07: 5 [IU] via SUBCUTANEOUS

## 2018-06-05 MED ORDER — SODIUM CHLORIDE 0.9% FLUSH
3.0000 mL | Freq: Two times a day (BID) | INTRAVENOUS | Status: DC
  Administered 2018-06-05 – 2018-06-06 (×2): 3 mL via INTRAVENOUS

## 2018-06-05 MED ORDER — SODIUM CHLORIDE 0.9% FLUSH
3.0000 mL | Freq: Two times a day (BID) | INTRAVENOUS | Status: DC
  Administered 2018-06-06 – 2018-06-07 (×2): 3 mL via INTRAVENOUS

## 2018-06-05 MED ORDER — POTASSIUM CHLORIDE CRYS ER 20 MEQ PO TBCR: 40.0000 meq | EXTENDED_RELEASE_TABLET | Freq: Once | ORAL | Status: DC

## 2018-06-05 MED ORDER — ONDANSETRON HCL 4 MG PO TABS: 4.0000 mg | ORAL_TABLET | Freq: Four times a day (QID) | ORAL | Status: DC | PRN

## 2018-06-05 MED ORDER — AMLODIPINE BESYLATE 10 MG PO TABS
10.0000 mg | ORAL_TABLET | Freq: Every day | ORAL | Status: DC
  Administered 2018-06-05 – 2018-06-07 (×3): 10 mg via ORAL
  Filled 2018-06-05 (×2): qty 1
  Filled 2018-06-05: qty 2

## 2018-06-05 MED ORDER — HEPARIN SODIUM (PORCINE) 5000 UNIT/ML IJ SOLN
5000.0000 [IU] | Freq: Three times a day (TID) | INTRAMUSCULAR | Status: DC
  Administered 2018-06-05 – 2018-06-07 (×7): 5000 [IU] via SUBCUTANEOUS
  Filled 2018-06-05 (×7): qty 1

## 2018-06-05 MED ORDER — HYDROCODONE-ACETAMINOPHEN 5-325 MG PO TABS
1.0000 | ORAL_TABLET | Freq: Four times a day (QID) | ORAL | Status: DC | PRN
  Administered 2018-06-06 – 2018-06-07 (×5): 1 via ORAL
  Filled 2018-06-05 (×5): qty 1

## 2018-06-05 MED ORDER — ATORVASTATIN CALCIUM 40 MG PO TABS
40.0000 mg | ORAL_TABLET | Freq: Every day | ORAL | Status: DC
  Administered 2018-06-05 – 2018-06-07 (×3): 40 mg via ORAL
  Filled 2018-06-05 (×3): qty 1

## 2018-06-05 MED ORDER — ONDANSETRON HCL 4 MG/2ML IJ SOLN: 4.0000 mg | Freq: Four times a day (QID) | INTRAMUSCULAR | Status: DC | PRN

## 2018-06-05 NOTE — ED Triage Notes (Signed)
Pt states she is supposed to be on insulin and another medication for her diabetes but was unable to get it the other medicine. Last dose of insulin pt gave herself was last night and her meter was reading high. Pt also reports polydypsia and polyuria. cbg noted to be greater than 600 in ED

## 2018-06-05 NOTE — ED Notes (Signed)
RN OTF with patient

## 2018-06-05 NOTE — Progress Notes (Signed)
Colleen [name] [id number] Admission Data: 06/05/2018 5:59 PM Attending Provider: Cristy Folks, MD  [id number], Cari Caraway, MD Consults/ Treatment Team:   Colleen [name] is a 57 y.o. female patient admitted from ED awake, alert  & orientated  X 3,  Full Code, VSS - Blood pressure (!) 142/71, pulse 91, temperature 98.2 F (36.8 C), temperature source Oral, resp. rate 18, last menstrual period 12/08/2013, SpO2 98 %., no c/o shortness of breath, no c/o chest pain, no distress noted. Tele # [id number] placed and pt is currently running:normal sinus rhythm.   IV site WDL:  upper arm right, condition patent and no redness with a transparent dsg that's clean dry and intact.  Allergies:  No Known Allergies   Past Medical History:  Diagnosis Date  . Chronic kidney disease   . Diabetes ([place])   . DVT (deep venous thrombosis) ([place])   . E-coli UTI   . Gout 06/05/2018  . HLD (hyperlipidemia) 06/05/2018  . PE (pulmonary embolism)   . 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 Tobacco/alcohol: denied none  Pt orientation to unit, room and routine. Information packet given to patient/family and safety video watched.  Admission INP armband ID verified with patient/family, and in place. SR up x 2, fall risk assessment complete with Patient and family verbalizing understanding of risks associated with falls. Pt verbalizes an understanding of how to use the call bell and to call for help before getting out of bed.  Skin, clean-dry- intact without evidence of bruising, or skin tears.   No evidence of skin break down noted on exam. no rashes, no ecchymoses, no petechiae, no nodules, no jaundice, no purpura, no wounds. Currently on glucose stabilizer. Will continue to monitor and assist as needed.   Salley Slaughter, RN 06/05/2018 5:59 PM

## 2018-06-05 NOTE — H&P (Signed)
History and Physical    Colleen [name] HCW:[id number] DOB: 05/15/1961 DOA: 06/05/2018  PCP: Damaris Hippo, MD   Patient coming from: home  I   Chief Complaint: hyperglycmiea  HPI: Colleen [name] is a 57 y.o. female with medical history significant of right-sided facial droop congenital, stage IV/V CKD, history of DVT/PE, hypertension, type 2 diabetes on insulin, gout, hyperlipidemia who comes in with hyperglycemia.  Patient began to have acute knee pain on 05/27/2018 and was seen in urgent care where she was told to go to the ED due to concern for DVT.Marland Kitchen  She then presented to the ED on 05/28/2018 with significant hyperglycemia and elevated creatinine with unknown baseline and so she was admitted.  Patient was noted to have mild AKI with reported baseline of 2.2 on 02/2018.  Renal ultrasound showed evidence of medical renal disease.  Patient was also noted to have hypoproliferative anemia.  Patient was empirically started on steroids for possible gouty arthritis and then discharged on Levemir nightly on 05/30/2018.  She was also taken off several of her oral hypoglycemics due to her worsening CKD.  She presents today because she noted that her blood sugars were running greater than the 500s at home despite her use of insulin and that she was taken off her oral hypoglycemics.  She does report some polyuria and polydipsia but denies any nausea, vomiting, diarrhea.  She has not had any fevers, chills, shortness of breath, rash anywhere.  She reports taking her insulin appropriately as prescribed.  ED Course: In the ED patient's vitals wereNotable for some hypertension.  Her serum glucose was 846.  Her creatinine was 4.24 with a BUN of 64.  Hemoglobin was 8.7.  White blood cell count was 15.6.  She is not acidotic on the VBG but a mildly low CO2.  Patient was noted to have pseudohyponatremia 126.  Patient was started on IV insulin and IV fluids.  Review of Systems: As per HPI otherwise 10 point review  of systems negative.    Past Medical History:  Diagnosis Date  . Chronic kidney disease   . Diabetes ([place])   . DVT (deep venous thrombosis) ([place])   . E-coli UTI   . Gout 06/05/2018  . HLD (hyperlipidemia) 06/05/2018  . PE (pulmonary embolism)   . 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 NO PAST SURGERIES       reports that she has never smoked. She has never used smokeless tobacco. She reports that she does not drink alcohol or use drugs.  No Known Allergies  Family History  Problem Relation Age of Onset  . CAD Neg Hx    Unacceptable: Noncontributory, unremarkable, or negative. Acceptable: Family history reviewed and not pertinent (If you reviewed it)  Prior to Admission medications   Medication Sig Start Date End Date Taking? Authorizing Provider  acetaminophen (TYLENOL) 325 MG tablet Take 2 tablets (650 mg total) by mouth every 6 (six) hours as needed for mild pain (or Fever >/= 101). 05/30/18   Emokpae, Courage, MD  amLODipine (NORVASC) 10 MG tablet Take 1 tablet (10 mg total) by mouth daily. 05/30/18   Roxan Hockey, MD  atorvastatin (LIPITOR) 40 MG tablet Take 1 tablet (40 mg total) by mouth daily at 6 PM. 05/30/18   Denton Brick, Courage, MD  blood glucose meter kit and supplies Relion Prime or Dispense other  brand based on patient and insurance preference. Use up to four times daily as directed. (FOR ICD-9 250.00, 250.01). 05/30/18   Roxan Hockey, MD  carvedilol (COREG) 12.5 MG tablet Take 1 tablet (12.5 mg total) by mouth 2 (two) times daily. 05/30/18   Roxan Hockey, MD  HYDROcodone-acetaminophen (NORCO) 5-325 MG tablet Take 1 tablet by mouth every 6 (six) hours as needed for moderate pain. 05/30/18   Roxan Hockey, MD  Insulin Detemir (LEVEMIR) 100 UNIT/ML Pen Inject 8 Units into the skin daily at 10 pm. 05/30/18   Roxan Hockey, MD  predniSONE (DELTASONE) 20 MG  tablet Take 1 tablet (20 mg total) by mouth daily with breakfast. 05/30/18   Denton Brick, Courage, MD  saxagliptin HCl (ONGLYZA) 5 MG TABS tablet Take 1 tablet (5 mg total) by mouth daily. For Diabetes 05/30/18   Roxan Hockey, MD  UNABLE TO FIND 1. Glucometer - 1 2. Glucometer strips - 1 box. 3. Lancets - 1 box 05/30/18   Roxan Hockey, MD    Physical Exam: Vitals:   06/05/18 0729 06/05/18 0829  BP: (!) 165/84 (!) 155/64  Pulse: (!) 102 92  Resp: 16 17  Temp: 98.8 F (37.1 C) 98.2 F (36.8 C)  TempSrc: Oral Oral  SpO2: 98% 96%    Constitutional: NAD, calm, comfortable Vitals:   06/05/18 0729 06/05/18 0829  BP: (!) 165/84 (!) 155/64  Pulse: (!) 102 92  Resp: 16 17  Temp: 98.8 F (37.1 C) 98.2 F (36.8 C)  TempSrc: Oral Oral  SpO2: 98% 96%   Eyes: Anicteric sclera ENMT: Right-sided facial paresis, dry mucous membranes Neck: normal, supple Respiratory: clear to auscultation bilaterally, no wheezing, no crackles. Normal respiratory effort. No accessory muscle use.  Cardiovascular: Regular rate and rhythm, 2 out of 6 systolic murmur heard best at left upper sternal border Abdomen: no tenderness, no masses palpated. No hepatosplenomegaly. Bowel sounds positive.  Musculoskeletal: 1+ lower extremity edema Skin: no rashes on visible skin Neurologic: Facial paralysis noted on right Psychiatric: Normal judgment and insight. Alert and oriented x 3. Normal mood.     Labs on Admission: I have personally reviewed following labs and imaging studies  CBC: Recent Labs  Lab 05/30/18 1414 05/31/18 2202 06/05/18 0746  WBC  --  13.7* 15.6*  HGB 7.4* 8.6* 8.7*  HCT 23.8* 27.7* 28.3*  MCV  --  73.1* 73.9*  PLT  --  306 761   Basic Metabolic Panel: Recent Labs  Lab 05/30/18 0739 05/31/18 2202 06/05/18 0746  NA 135 134* 126*  K 3.9 4.3 4.9  CL 108 107 99  CO2 18* 18* 17*  GLUCOSE 313* 457* 846*  BUN 49* 66* 64*  CREATININE 3.90* 4.17* 4.24*  CALCIUM 9.1 8.9 8.5*  PHOS  4.4  --   --    GFR: Estimated Creatinine Clearance: 14.6 mL/min (A) (by C-G formula based on SCr of 4.24 mg/dL (H)). Liver Function Tests: Recent Labs  Lab 05/30/18 0739  ALBUMIN 2.3*   No results for input(s): LIPASE, AMYLASE in the last 168 hours. No results for input(s): AMMONIA in the last 168 hours. Coagulation Profile: No results for input(s): INR, PROTIME in the last 168 hours. Cardiac Enzymes: No results for input(s): CKTOTAL, CKMB, CKMBINDEX, TROPONINI in the last 168 hours. BNP (last 3 results) No results for input(s): PROBNP in the last 8760 hours. HbA1C: No results for input(s): HGBA1C in the last 72 hours. CBG: Recent Labs  Lab 05/31/18 2148 05/31/18 2326 06/01/18 0059 06/01/18 0235 06/05/18  0729  GLUCAP 426* 398* 381* 351* >600*   Lipid Profile: No results for input(s): CHOL, HDL, LDLCALC, TRIG, CHOLHDL, LDLDIRECT in the last 72 hours. Thyroid Function Tests: No results for input(s): TSH, T4TOTAL, FREET4, T3FREE, THYROIDAB in the last 72 hours. Anemia Panel: No results for input(s): VITAMINB12, FOLATE, FERRITIN, TIBC, IRON, RETICCTPCT in the last 72 hours. Urine analysis:    Component Value Date/Time   COLORURINE STRAW (A) 06/05/2018 0910   APPEARANCEUR HAZY (A) 06/05/2018 0910   LABSPEC 1.016 06/05/2018 0910   PHURINE 6.0 06/05/2018 0910   GLUCOSEU >=500 (A) 06/05/2018 0910   HGBUR SMALL (A) 06/05/2018 0910   BILIRUBINUR NEGATIVE 06/05/2018 0910   KETONESUR NEGATIVE 06/05/2018 0910   PROTEINUR >=300 (A) 06/05/2018 0910   UROBILINOGEN 0.2 09/22/2013 0803   NITRITE NEGATIVE 06/05/2018 0910   LEUKOCYTESUR NEGATIVE 06/05/2018 0910    Radiological Exams on Admission: Dg Chest 2 View  Result Date: 06/05/2018 CLINICAL DATA:  Patient with low blood sugar. EXAM: CHEST - 2 VIEW COMPARISON:  Chest CT 10/06/2013 FINDINGS: Monitoring leads overlie the patient. Cardiomegaly. Bibasilar heterogeneous pulmonary opacities. No pleural effusion or pneumothorax.  Thoracic spine degenerative changes. IMPRESSION: No acute cardiopulmonary process. Cardiomegaly. Basilar atelectasis. Electronically Signed   By: Lovey Newcomer M.D.   On: 06/05/2018 10:32    EKG: Independently reviewed.  None performed  Assessment/Plan Principal Problem:   Hyperglycemia Active Problems:   DM (diabetes mellitus), type 2, uncontrolled (HCC)   HTN (hypertension)   CKD (chronic kidney disease) stage 4, GFR 15-29 ml/min (HCC)   HLD (hyperlipidemia)   Gout    #) Hyperglycemia in the setting of type 2 diabetes: Likely exacerbating factor was prednisone started for gout.  She does not have evidence of an infection though she does have an elevated white count which is likely reactive. -IV insulin -Every 2 hour blood sugar checks -Repeat BMP -Start subcu Levemir nightly -Sliding scale insulin -Carb restricted diet -Hemoglobin A1c ordered -Hold home oral hypoglycemics  #) Acute on chronic stage IV CKD: She is had fairly rapid progression of her CKD.  It is unclear what her true baseline is though suspect that it is likely in the high threes. -IV fluids -Avoid nephrotoxins  #) Hypertension/hyperlipidemia: -Continue atorvastatin 40 mg nightly -Continue globin 10 mg daily -Continue carvedilol 12.5 mg twice daily  #) Possible gout: Would strongly recommend performing arthrocentesis and confirming gout.  She currently does not have any other symptomatology.  #)history of DVT/PE: Patient has been off anticoagulation for some time.  Fluids: Gentle IV fluids Elect lites: Monitor and supplement Nutrition: Carb restricted diet  Prophylaxis: Subcu heparin  Disposition: Pending stabilization of blood sugars  Full code   Cristy Folks MD [facility]   If 7PM-7AM, please contact night-coverage [email or web address] Password [email or web address]  06/05/2018, 11:10 AM

## 2018-06-05 NOTE — ED Notes (Signed)
Patient transported to X-ray 

## 2018-06-05 NOTE — ED Notes (Signed)
Attempted to call report

## 2018-06-05 NOTE — ED Notes (Signed)
Dr. Herbert Moors at bedside for admission consult.

## 2018-06-05 NOTE — ED Notes (Signed)
Lunch tray ordered; carb modified fluid consistency

## 2018-06-05 NOTE — Progress Notes (Signed)
I spoke with pharmacy & [facility] about holding scheduled insulin. Patient will not received scheduled insulin due to being on glucostabilizer.

## 2018-06-05 NOTE — ED Notes (Signed)
Report given to 5w rn

## 2018-06-05 NOTE — ED Provider Notes (Signed)
[facility] EMERGENCY DEPARTMENT Provider Note   CSN: [id number] Arrival date & time: 06/05/18  7829     History   Chief Complaint Chief Complaint  Patient presents with  . Hyperglycemia    HPI Colleen [name] is a 57 y.o. female.  HPI Pt presents with hyperglycemia.  Her blood sugar was elevated yesterday over 400.  She continued to check and it remained elevated.  No fevers or chills.  No vomiting or diarrhea.  She has noticed her mouth feels dry.  No cough.  No trouble urinating.  This morning her blood sugar was even higher so she came to the ED   [facility] had the same problem last week.  She was seen in the ED and released. Her doctor increased her medications.  SHe is on insulin but not on a sliding scale.   Past Medical History:  Diagnosis Date  . Chronic kidney disease   . Diabetes ([place])   . DVT (deep venous thrombosis) ([place])   . E-coli UTI   . PE (pulmonary embolism)   . 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 Swelling of lower extremity 05/28/2018  . CKD (chronic kidney disease) stage 4, GFR 15-29 ml/min (HCC) 05/28/2018  . Hypochromic microcytic anemia 05/28/2018  . Lower extremity edema 05/28/2018  . HTN (hypertension) 10/06/2013  . Acute pulmonary embolism ([place]) 10/06/2013  . Congestive dilated cardiomyopathy ([place]) 10/05/2013  . Carotid bruit 10/05/2013  . DM (diabetes mellitus), type 2, uncontrolled ([place]) 10/04/2013  . Acute on chronic systolic heart failure ([place]) 10/02/2013  . E-coli UTI 10/02/2013  . ARDS (adult respiratory distress syndrome) ([place]) 09/27/2013  . Acute renal insufficiency 09/27/2013  . Acute respiratory failure with hypoxia ([place]) 09/25/2013  . Septic shock(785.52) 09/25/2013  . Bacteremia 09/25/2013    Past Surgical History:  Procedure Laterality Date  . NO PAST SURGERIES       OB History   None       Home Medications    Prior to Admission medications   Medication Sig Start Date End Date Taking? Authorizing Provider  acetaminophen (TYLENOL) 325 MG tablet Take 2 tablets (650 mg total) by mouth every 6 (six) hours as needed for mild pain (or Fever >/= 101). 05/30/18   Emokpae, Courage, MD  amLODipine (NORVASC) 10 MG tablet Take 1 tablet (10 mg total) by mouth daily. 05/30/18   Roxan Hockey, MD  atorvastatin (LIPITOR) 40 MG tablet Take 1 tablet (40 mg total) by mouth daily at 6 PM. 05/30/18   Roxan Hockey, MD  blood glucose meter kit and supplies Relion Prime or Dispense other brand based on patient and insurance preference. Use up to four times daily as directed. (FOR ICD-9 250.00, 250.01). 05/30/18   Roxan Hockey, MD  carvedilol (COREG) 12.5 MG tablet Take 1 tablet (12.5 mg total) by mouth 2 (two) times daily. 05/30/18   Roxan Hockey, MD  HYDROcodone-acetaminophen (NORCO) 5-325 MG tablet Take 1 tablet by mouth every 6 (six) hours as needed for moderate pain. 05/30/18   Roxan Hockey, MD  Insulin Detemir (LEVEMIR) 100 UNIT/ML Pen Inject 8 Units into the skin daily at 10 pm. 05/30/18   Roxan Hockey, MD  predniSONE (DELTASONE) 20 MG tablet Take 1 tablet (20 mg total) by mouth daily with breakfast. 05/30/18   Roxan Hockey, MD  saxagliptin HCl (ONGLYZA) 5 MG  TABS tablet Take 1 tablet (5 mg total) by mouth daily. For Diabetes 05/30/18   Roxan Hockey, MD  UNABLE TO FIND 1. Glucometer - 1 2. Glucometer strips - 1 box. 3. Lancets - 1 box 05/30/18   Roxan Hockey, MD    Family History Family History  Problem Relation Age of Onset  . CAD Neg Hx     Social History Social History   Tobacco Use  . Smoking status: Never Smoker  . Smokeless tobacco: Never Used  Substance Use Topics  . Alcohol use: No  . Drug use: No     Allergies   Patient has no known allergies.   Review of Systems Review of Systems  All other systems reviewed and are  negative.    Physical Exam Updated Vital Signs BP (!) 155/64 (BP Location: Right Arm)   Pulse 92   Temp 98.2 F (36.8 C) (Oral)   Resp 17   LMP 12/08/2013   SpO2 96%   Physical Exam  Constitutional: She appears well-developed and well-nourished.  HENT:  Head: Normocephalic and atraumatic.  Right Ear: External ear normal.  Left Ear: External ear normal.  Mouth dry  Eyes: Conjunctivae are normal. Right eye exhibits no discharge. Left eye exhibits no discharge. No scleral icterus.  Neck: Neck supple. No tracheal deviation present.  Cardiovascular: Normal rate, regular rhythm and intact distal pulses.  Pulmonary/Chest: Effort normal and breath sounds normal. No stridor. No respiratory distress. She has no wheezes. She has no rales.  Abdominal: Soft. Bowel sounds are normal. She exhibits no distension. There is no tenderness. There is no rebound and no guarding.  Musculoskeletal: She exhibits no edema or tenderness.  Neurological: She is alert. She has normal strength. No cranial nerve deficit (no facial droop, extraocular movements intact, no slurred speech) or sensory deficit. She exhibits normal muscle tone. She displays no seizure activity. Coordination normal.  Right facial palsy  Skin: Skin is warm and dry. No rash noted. She is not diaphoretic.  Psychiatric: She has a normal mood and affect.  Nursing note and vitals reviewed.    ED Treatments / Results  Labs (all labs ordered are listed, but only abnormal results are displayed) Labs Reviewed  BASIC METABOLIC PANEL - Abnormal; Notable for the following components:      Result Value   Sodium 126 (*)    CO2 17 (*)    Glucose, Bld 846 (*)    BUN 64 (*)    Creatinine, Ser 4.24 (*)    Calcium 8.5 (*)    GFR calc non Af Amer 11 (*)    GFR calc Af Amer 13 (*)    All other components within normal limits  CBC - Abnormal; Notable for the following components:   WBC 15.6 (*)    RBC 3.83 (*)    Hemoglobin 8.7 (*)    HCT  28.3 (*)    MCV 73.9 (*)    MCH 22.7 (*)    RDW 17.2 (*)    All other components within normal limits  URINALYSIS, ROUTINE W REFLEX MICROSCOPIC - Abnormal; Notable for the following components:   Color, Urine STRAW (*)    APPearance HAZY (*)    Glucose, UA >=500 (*)    Hgb urine dipstick SMALL (*)    Protein, ur >=300 (*)    All other components within normal limits  CBG MONITORING, ED - Abnormal; Notable for the following components:   Glucose-Capillary >600 (*)    All other  components within normal limits  I-STAT BETA HCG BLOOD, ED (MC, [facility], AP ONLY) - Abnormal; Notable for the following components:   I-stat hCG, quantitative 5.9 (*)    All other components within normal limits  I-STAT VENOUS BLOOD GAS, ED - Abnormal; Notable for the following components:   pCO2, Ven 32.8 (*)    pO2, Ven 69.0 (*)    Bicarbonate 15.8 (*)    TCO2 17 (*)    Acid-base deficit 10.0 (*)    All other components within normal limits  CBG MONITORING, ED     Radiology Dg Chest 2 View  Result Date: 06/05/2018 CLINICAL DATA:  Patient with low blood sugar. EXAM: CHEST - 2 VIEW COMPARISON:  Chest CT 10/06/2013 FINDINGS: Monitoring leads overlie the patient. Cardiomegaly. Bibasilar heterogeneous pulmonary opacities. No pleural effusion or pneumothorax. Thoracic spine degenerative changes. IMPRESSION: No acute cardiopulmonary process. Cardiomegaly. Basilar atelectasis. Electronically Signed   By: Lovey Newcomer M.D.   On: 06/05/2018 10:32    Procedures .Critical Care Performed by: Dorie Rank, MD Authorized by: Dorie Rank, MD   Critical care provider statement:    Critical care time (minutes):  35   Critical care was time spent personally by me on the following activities:  Discussions with consultants, evaluation of patient's response to treatment, examination of patient, ordering and performing treatments and interventions, ordering and review of laboratory studies, ordering and review of radiographic  studies, pulse oximetry, re-evaluation of patient's condition, obtaining history from patient or surrogate and review of old charts   (including critical care time)  Medications Ordered in ED Medications  sodium chloride 0.9 % bolus 1,000 mL (1,000 mLs Intravenous New Bag/Given 06/05/18 1034)    And  sodium chloride 0.9 % bolus 1,000 mL (has no administration in time range)    And  0.9 %  sodium chloride infusion (has no administration in time range)  potassium chloride SA (K-DUR,KLOR-CON) CR tablet 40 mEq (40 mEq Oral Not Given 06/05/18 1004)  insulin regular (NOVOLIN R,HUMULIN R) 100 Units in sodium chloride 0.9 % 100 mL (1 Units/mL) infusion (has no administration in time range)     Initial Impression / Assessment and Plan / ED Course  I have reviewed the triage vital signs and the nursing notes.  Pertinent labs & imaging results that were available during my care of the patient were reviewed by me and considered in my medical decision making (see chart for details).  Clinical Course as of Jun 05 1104  Sat Jun 05, 2018  1100 Chest x-ray without signs of pneumonia.  UA not definitive for urinary tract infection.  Labs notable for stable anemia.  She also has worsening creatinine indicative of acute on chronic kidney disease.  This may be related to dehydration associated with her hyperglycemia.  No acidosis noted on the venous blood gas to suggest DKA   [JK]  1101 Cr clearacne is less than 45.  Pt will need to reduce her onglyza dosing   [JK]    Clinical Course User Index [JK] Dorie Rank, MD    Patient presents to the emergency room for evaluation of worsening hyperglycemia.  Patient is currently on once daily insulin.  She is not on a sliding scale.  Patient's blood sugar is elevated at 846.  No signs of DKA.  She also has worsening renal function.  Patient has been started on IV fluids.  I have ordered insulin infusion.  Considering her persistent hyperglycemia, recurrent visits to  the ED, and  acute on chronic kidney injury I will consult the medical service for admission and further treatment.  Final Clinical Impressions(s) / ED Diagnoses   Final diagnoses:  Hyperglycemia  Anemia, unspecified type  Acute renal failure superimposed on chronic kidney disease, unspecified CKD stage, unspecified acute renal failure type [facility])      Dorie Rank, MD 06/05/18 1105

## 2018-06-06 ENCOUNTER — Other Ambulatory Visit: Payer: Self-pay

## 2018-06-06 DIAGNOSIS — M7989 Other specified soft tissue disorders: Secondary | ICD-10-CM | POA: Diagnosis not present

## 2018-06-06 DIAGNOSIS — D631 Anemia in chronic kidney disease: Secondary | ICD-10-CM | POA: Diagnosis not present

## 2018-06-06 DIAGNOSIS — E1165 Type 2 diabetes mellitus with hyperglycemia: Secondary | ICD-10-CM | POA: Diagnosis not present

## 2018-06-06 DIAGNOSIS — N189 Chronic kidney disease, unspecified: Secondary | ICD-10-CM | POA: Diagnosis present

## 2018-06-06 DIAGNOSIS — N179 Acute kidney failure, unspecified: Secondary | ICD-10-CM | POA: Diagnosis not present

## 2018-06-06 DIAGNOSIS — G8929 Other chronic pain: Secondary | ICD-10-CM | POA: Diagnosis not present

## 2018-06-06 DIAGNOSIS — D649 Anemia, unspecified: Secondary | ICD-10-CM | POA: Diagnosis not present

## 2018-06-06 DIAGNOSIS — M25562 Pain in left knee: Secondary | ICD-10-CM | POA: Diagnosis not present

## 2018-06-06 DIAGNOSIS — E871 Hypo-osmolality and hyponatremia: Secondary | ICD-10-CM

## 2018-06-06 DIAGNOSIS — R739 Hyperglycemia, unspecified: Secondary | ICD-10-CM | POA: Diagnosis not present

## 2018-06-06 LAB — RENAL FUNCTION PANEL
Albumin: 1.9 g/dL — ABNORMAL LOW (ref 3.5–5.0)
Anion gap: 9 (ref 5–15)
BUN: 63 mg/dL — ABNORMAL HIGH (ref 6–20)
CO2: 18 mmol/L — ABNORMAL LOW (ref 22–32)
Calcium: 8.3 mg/dL — ABNORMAL LOW (ref 8.9–10.3)
Chloride: 108 mmol/L (ref 98–111)
Creatinine, Ser: 4.5 mg/dL — ABNORMAL HIGH (ref 0.44–1.00)
GFR calc Af Amer: 12 mL/min — ABNORMAL LOW (ref >60)
GFR calc non Af Amer: 10 mL/min — ABNORMAL LOW (ref >60)
Glucose, Bld: 163 mg/dL — ABNORMAL HIGH (ref 70–99)
Phosphorus: 4.8 mg/dL — ABNORMAL HIGH (ref 2.5–4.6)
Potassium: 4.1 mmol/L (ref 3.5–5.1)
Sodium: 135 mmol/L (ref 135–145)

## 2018-06-06 LAB — CBC
HCT: 24 % — ABNORMAL LOW (ref 36.0–46.0)
HCT: 28.9 % — ABNORMAL LOW (ref 36.0–46.0)
Hemoglobin: 7.3 g/dL — ABNORMAL LOW (ref 12.0–15.0)
Hemoglobin: 9.2 g/dL — ABNORMAL LOW (ref 12.0–15.0)
MCH: 22.3 pg — ABNORMAL LOW (ref 26.0–34.0)
MCH: 23.8 pg — ABNORMAL LOW (ref 26.0–34.0)
MCHC: 30.4 g/dL (ref 30.0–36.0)
MCHC: 31.8 g/dL (ref 30.0–36.0)
MCV: 73.4 fL — ABNORMAL LOW (ref 78.0–100.0)
MCV: 74.9 fL — ABNORMAL LOW (ref 78.0–100.0)
Platelets: 257 10*3/uL (ref 150–400)
Platelets: 267 10*3/uL (ref 150–400)
RBC: 3.27 MIL/uL — ABNORMAL LOW (ref 3.87–5.11)
RBC: 3.86 MIL/uL — ABNORMAL LOW (ref 3.87–5.11)
RDW: 17.2 % — ABNORMAL HIGH (ref 11.5–15.5)
RDW: 18.6 % — ABNORMAL HIGH (ref 11.5–15.5)
WBC: 12.8 10*3/uL — ABNORMAL HIGH (ref 4.0–10.5)
WBC: 10.8 10*3/uL — ABNORMAL HIGH (ref 4.0–10.5)

## 2018-06-06 LAB — GLUCOSE, CAPILLARY
Glucose-Capillary: 120 mg/dL — ABNORMAL HIGH (ref 70–99)
Glucose-Capillary: 156 mg/dL — ABNORMAL HIGH (ref 70–99)
Glucose-Capillary: 161 mg/dL — ABNORMAL HIGH (ref 70–99)
Glucose-Capillary: 190 mg/dL — ABNORMAL HIGH (ref 70–99)
Glucose-Capillary: 289 mg/dL — ABNORMAL HIGH (ref 70–99)
Glucose-Capillary: 316 mg/dL — ABNORMAL HIGH (ref 70–99)

## 2018-06-06 LAB — PREPARE RBC (CROSSMATCH)

## 2018-06-06 LAB — HEMOGLOBIN A1C
Hgb A1c MFr Bld: 8.6 % — ABNORMAL HIGH (ref 4.8–5.6)
Mean Plasma Glucose: 200 mg/dL

## 2018-06-06 MED ORDER — SODIUM CHLORIDE 0.9% IV SOLUTION: Freq: Once | INTRAVENOUS | Status: DC

## 2018-06-06 NOTE — Progress Notes (Signed)
PROGRESS NOTE  Colleen [name] IRJ:[id number] DOB: 1961/01/11 DOA: 06/05/2018 PCP: Damaris Hippo, MD  HPI  Colleen [name] is a 57 y.o. year old female with medical history significant for HTN, type 2 diabetes, CKD, recently discharged on 6/24 patient of left leg swelling and pain negative for DVT at this time and prescribed prednisone for possible gout (unable to use NSAIDs due to kidney concerns) who presented on 06/05/2018 with hyperglycemic episode in setting of steroids.  She was recently discharged on 6/24 with instructions to take prednisone for her left knee pain for presumed gout ( NSAID contraindicated because of CKD) and Levemir 10 U plus a SGLT-2 for her diabetes. She was unable to fill her onglyza as the pharmacy didn't have any. She stopped taking the prednisone after a few days as she noticed her glucose levels were in the 400s. She saw an orthopedist on Thursday and was given a steroid injection into her knee with little improvement in pain and found her glucose at home to be 600 prompting her to come to the ED for evaluation.   Otherwise per her CKD she has noticed some decrease in urination but no pain.   Interval History    ROS:    Subjective Doing well this am  Assessment/Plan: Principal Problem:   Hyperglycemia Active Problems:   DM (diabetes mellitus), type 2, uncontrolled (HCC)   HTN (hypertension)   CKD (chronic kidney disease) stage 4, GFR 15-29 ml/min (HCC)   HLD (hyperlipidemia)   Gout  T2DM with Hyperglycemia, likely induced by steroids(steroid injection to knee + 2 days of prednisone), improving.  Admission glucose greater than 600 but has improved to 100s so we have discontinued insulin gtt and received 1st dose Levemir 8 U shortly after. Glucose now in 160s,  A1c 8.4.  No anion gap, continue to monitor.Avoid further steroids   Acute hyponatremia, secondary to hyperglycemia. Resolved with IV fluids and correction of hyperglycemia likely related to  hyperglycemia.  Monitor BMP  AKI on CKD with non-anion gap metabolic acidosis, stage IV.  Likely acute worsening in setting of dehydration related to hyperglycemia.  Continue to monitor BMP if creatinine continues elevated will consult nephrology.  New baseline creatinine established on recent admission creatinine 3.7-4.1(around 2.5 on 02/2018), currently 4.5. Avoid nephrotoxic agents, strict I's and O's, daily BMP  Acute on chronic anemia.  Likely due to anemia of chronic kidney disease.  Baseline hemoglobin around 8.  Dropped to 7.3 without any signs or symptoms of bleeding.  Leukocytosis.  Elevated white count of 15.  UA test x-ray negative for infection, likely demargination related to prednisone therapy  Left knee pain. Ongoing for 3 weeks. NO improvement with prednisone or steroid injection as outpatient. Plan for MRI as outpatient with ortho. Tender on exam but no warmth or swelling, doubt infection  Hypertension, at goal.  Continue home amlodipine, Coreg  Code Status: Full  Family Communication: no family at bedside   Disposition Plan: glucose monitoring, BMP in pm    Consultants:  None     Procedures:  none   Antimicrobials: Anti-infectives (From admission, onward)   None         Cultures: none    Telemetry:  DVT prophylaxis: SCDs   Objective: Vitals:   06/05/18 1700 06/05/18 1954 06/05/18 2000 06/06/18 0000  BP: (!) 144/72  127/68 130/72  Pulse: 92  93 82  Resp: 19  19 14   Temp: 98.8 F (37.1 C) 99 F (37.2 C)  TempSrc: Oral Oral    SpO2: 99%  98% 100%  Weight: 71.9 kg (158 lb 8.2 oz)     Height: 5\' 4"  (1.626 m)       Intake/Output Summary (Last 24 hours) at 06/06/2018 0807 Last data filed at 06/06/2018 0500 Gross per 24 hour  Intake 821.72 ml  Output -  Net 821.72 ml   Filed Weights   06/05/18 1700  Weight: 71.9 kg (158 lb 8.2 oz)    Exam:  Constitutional:normal appearing female, no distress Eyes: EOMI, anicteric, normal  conjunctivae ENMT: dry oral mucosa, normal dentition Cardiovascular: RRR no MRGs, with no peripheral edema Respiratory: Normal respiratory effort, clear breath sounds  Abdomen: Soft,non-tender, with no HSM MSK: Left knee some tenderness on palpation, no swelling or erythema Skin: No rash ulcers, or lesions. Without skin tenting  Neurologic: Grossly no focal neuro deficit. Psychiatric:Appropriate affect, and mood. Mental status AAOx3  Data Reviewed: CBC: Recent Labs  Lab 05/30/18 1414 05/31/18 2202 06/05/18 0746 06/06/18 0327  WBC  --  13.7* 15.6* 12.8*  HGB 7.4* 8.6* 8.7* 7.3*  HCT 23.8* 27.7* 28.3* 24.0*  MCV  --  73.1* 73.9* 73.4*  PLT  --  306 307 409   Basic Metabolic Panel: Recent Labs  Lab 05/31/18 2202 06/05/18 0746 06/06/18 0327  NA 134* 126* 135  K 4.3 4.9 4.1  CL 107 99 108  CO2 18* 17* 18*  GLUCOSE 457* 846* 163*  BUN 66* 64* 63*  CREATININE 4.17* 4.24* 4.50*  CALCIUM 8.9 8.5* 8.3*  PHOS  --   --  4.8*   GFR: Estimated Creatinine Clearance: 13.7 mL/min (A) (by C-G formula based on SCr of 4.5 mg/dL (H)). Liver Function Tests: Recent Labs  Lab 06/06/18 0327  ALBUMIN 1.9*   No results for input(s): LIPASE, AMYLASE in the last 168 hours. No results for input(s): AMMONIA in the last 168 hours. Coagulation Profile: No results for input(s): INR, PROTIME in the last 168 hours. Cardiac Enzymes: No results for input(s): CKTOTAL, CKMB, CKMBINDEX, TROPONINI in the last 168 hours. BNP (last 3 results) No results for input(s): PROBNP in the last 8760 hours. HbA1C: No results for input(s): HGBA1C in the last 72 hours. CBG: Recent Labs  Lab 06/05/18 2206 06/05/18 2310 06/06/18 0035 06/06/18 0425 06/06/18 0743  GLUCAP 163* 126* 156* 161* 120*   Lipid Profile: No results for input(s): CHOL, HDL, LDLCALC, TRIG, CHOLHDL, LDLDIRECT in the last 72 hours. Thyroid Function Tests: No results for input(s): TSH, T4TOTAL, FREET4, T3FREE, THYROIDAB in the last 72  hours. Anemia Panel: No results for input(s): VITAMINB12, FOLATE, FERRITIN, TIBC, IRON, RETICCTPCT in the last 72 hours. Urine analysis:    Component Value Date/Time   COLORURINE STRAW (A) 06/05/2018 0910   APPEARANCEUR HAZY (A) 06/05/2018 0910   LABSPEC 1.016 06/05/2018 0910   PHURINE 6.0 06/05/2018 0910   GLUCOSEU >=500 (A) 06/05/2018 0910   HGBUR SMALL (A) 06/05/2018 0910   BILIRUBINUR NEGATIVE 06/05/2018 0910   KETONESUR NEGATIVE 06/05/2018 0910   PROTEINUR >=300 (A) 06/05/2018 0910   UROBILINOGEN 0.2 09/22/2013 0803   NITRITE NEGATIVE 06/05/2018 0910   LEUKOCYTESUR NEGATIVE 06/05/2018 0910   Sepsis Labs: @LABRCNTIP (procalcitonin:4,lacticidven:4)  ) Recent Results (from the past 240 hour(s))  Culture, Urine     Status: Abnormal   Collection Time: 05/28/18  5:36 AM  Result Value Ref Range Status   Specimen Description URINE, RANDOM  Final   Special Requests NONE  Final   Culture (A)  Final  50,000 COLONIES/mL DIPHTHEROIDS(CORYNEBACTERIUM SPECIES) Standardized susceptibility testing for this organism is not available. Performed at [facility], [place] [address], [place], [place] [address]    Report Status 05/29/2018 FINAL  Final      Studies: Dg Chest 2 View  Result Date: 06/05/2018 CLINICAL DATA:  Patient with low blood sugar. EXAM: CHEST - 2 VIEW COMPARISON:  Chest CT 10/06/2013 FINDINGS: Monitoring leads overlie the patient. Cardiomegaly. Bibasilar heterogeneous pulmonary opacities. No pleural effusion or pneumothorax. Thoracic spine degenerative changes. IMPRESSION: No acute cardiopulmonary process. Cardiomegaly. Basilar atelectasis. Electronically Signed   By: Lovey Newcomer M.D.   On: 06/05/2018 10:32    Scheduled Meds: . sodium chloride   Intravenous Once  . amLODipine  10 mg Oral Daily  . atorvastatin  40 mg Oral q1800  . carvedilol  12.5 mg Oral BID  . heparin  5,000 Units Subcutaneous Q8H  . insulin aspart  0-15 Units Subcutaneous Q4H  . insulin  detemir  8 Units Subcutaneous Q2200  . potassium chloride  40 mEq Oral Once  . sodium chloride flush  3 mL Intravenous Q12H  . sodium chloride flush  3 mL Intravenous Q12H    Continuous Infusions: . sodium chloride       LOS: 0 days     Desiree Hane, MD [facility] Pager [phone]  If 7PM-7AM, please contact night-coverage [email or web address] Password [email or web address] 06/06/2018, 8:07 AM

## 2018-06-07 ENCOUNTER — Observation Stay (HOSPITAL_COMMUNITY): Payer: Federal, State, Local not specified - PPO

## 2018-06-07 DIAGNOSIS — E1165 Type 2 diabetes mellitus with hyperglycemia: Secondary | ICD-10-CM

## 2018-06-07 DIAGNOSIS — D649 Anemia, unspecified: Secondary | ICD-10-CM | POA: Diagnosis not present

## 2018-06-07 DIAGNOSIS — N189 Chronic kidney disease, unspecified: Secondary | ICD-10-CM

## 2018-06-07 DIAGNOSIS — M7989 Other specified soft tissue disorders: Secondary | ICD-10-CM

## 2018-06-07 DIAGNOSIS — N179 Acute kidney failure, unspecified: Secondary | ICD-10-CM

## 2018-06-07 DIAGNOSIS — D631 Anemia in chronic kidney disease: Secondary | ICD-10-CM

## 2018-06-07 DIAGNOSIS — R739 Hyperglycemia, unspecified: Secondary | ICD-10-CM | POA: Diagnosis not present

## 2018-06-07 DIAGNOSIS — M25562 Pain in left knee: Secondary | ICD-10-CM

## 2018-06-07 LAB — BASIC METABOLIC PANEL
Anion gap: 11 (ref 5–15)
BUN: 70 mg/dL — ABNORMAL HIGH (ref 6–20)
CO2: 16 mmol/L — ABNORMAL LOW (ref 22–32)
Calcium: 8.4 mg/dL — ABNORMAL LOW (ref 8.9–10.3)
Chloride: 110 mmol/L (ref 98–111)
Creatinine, Ser: 4.47 mg/dL — ABNORMAL HIGH (ref 0.44–1.00)
GFR calc Af Amer: 12 mL/min — ABNORMAL LOW (ref >60)
GFR calc non Af Amer: 10 mL/min — ABNORMAL LOW (ref >60)
Glucose, Bld: 139 mg/dL — ABNORMAL HIGH (ref 70–99)
Potassium: 4.4 mmol/L (ref 3.5–5.1)
Sodium: 137 mmol/L (ref 135–145)

## 2018-06-07 LAB — BPAM RBC
Blood Product Expiration Date: 201907282359
ISSUE DATE / TIME: 201906300847
Unit Type and Rh: 5100

## 2018-06-07 LAB — CBC
HCT: 29.6 % — ABNORMAL LOW (ref 36.0–46.0)
Hemoglobin: 9.2 g/dL — ABNORMAL LOW (ref 12.0–15.0)
MCH: 23.5 pg — ABNORMAL LOW (ref 26.0–34.0)
MCHC: 31.1 g/dL (ref 30.0–36.0)
MCV: 75.7 fL — ABNORMAL LOW (ref 78.0–100.0)
Platelets: 270 10*3/uL (ref 150–400)
RBC: 3.91 MIL/uL (ref 3.87–5.11)
RDW: 18.6 % — ABNORMAL HIGH (ref 11.5–15.5)
WBC: 10.2 10*3/uL (ref 4.0–10.5)

## 2018-06-07 LAB — GLUCOSE, CAPILLARY
Glucose-Capillary: 108 mg/dL — ABNORMAL HIGH (ref 70–99)
Glucose-Capillary: 136 mg/dL — ABNORMAL HIGH (ref 70–99)
Glucose-Capillary: 224 mg/dL — ABNORMAL HIGH (ref 70–99)
Glucose-Capillary: 232 mg/dL — ABNORMAL HIGH (ref 70–99)
Glucose-Capillary: 243 mg/dL — ABNORMAL HIGH (ref 70–99)

## 2018-06-07 LAB — TYPE AND SCREEN
ABO/RH(D): O POS
Antibody Screen: NEGATIVE
Unit division: 0

## 2018-06-07 LAB — PHOSPHORUS: Phosphorus: 5.1 mg/dL — ABNORMAL HIGH (ref 2.5–4.6)

## 2018-06-07 MED ORDER — SODIUM BICARBONATE 650 MG PO TABS: 650.0000 mg | ORAL_TABLET | Freq: Three times a day (TID) | ORAL | 1 refills | Status: DC

## 2018-06-07 MED ORDER — SAXAGLIPTIN HCL 5 MG PO TABS: 2.5000 mg | ORAL_TABLET | Freq: Every day | ORAL | 2 refills | Status: DC

## 2018-06-07 MED ORDER — SODIUM BICARBONATE 650 MG PO TABS
650.0000 mg | ORAL_TABLET | Freq: Three times a day (TID) | ORAL | Status: DC
  Administered 2018-06-07 (×2): 650 mg via ORAL
  Filled 2018-06-07 (×2): qty 1

## 2018-06-07 NOTE — Discharge Summary (Signed)
Discharge Summary  Colleen [name]:[id number] DOB: [birth year]  PCP: Damaris Hippo, MD  Admit date: 06/05/2018 Discharge date: 06/07/2018   Time spent: < 25 minutes  Admitted From: home Disposition:  home  Recommendations for Outpatient Follow-up:  1. Follow up with PCP in 1-2 weeks 2. Follow up w/ Dr. Corliss Blacker. He will call to arrange close follow up 3. Discontinue further prednisone. Avoid steroid injections 4. Ok to take onglyza 2.5 mg daily(given GFR< 45) 5. Levemir 8 U.      Discharge Diagnoses:  Active Hospital Problems   Diagnosis Date Noted  . Hyperglycemia 06/05/2018  . Acute kidney injury superimposed on CKD ([place]) 06/06/2018  . Left knee pain 06/06/2018  . Anemia due to chronic kidney disease 06/06/2018  . Hyponatremia 06/06/2018  . HLD (hyperlipidemia) 06/05/2018  . Gout 06/05/2018  . CKD (chronic kidney disease) stage 4, GFR 15-29 ml/min (HCC) 05/28/2018  . HTN (hypertension) 10/06/2013  . DM (diabetes mellitus), type 2, uncontrolled ([place]) 10/04/2013    Resolved Hospital Problems  No resolved problems to display.    Discharge Condition: Stable  CODE STATUS: Full code Diet recommendation: Carb modified diet  Vitals:   06/06/18 1300 06/07/18 0633  BP:  123/72  Pulse: 80 79  Resp: 12 10  Temp:    SpO2: 100% 98%    History of present illness:  Colleen [name] is a 57 y.o. year old female with medical history significant for HTN, type 2 diabetes, CKD, recently discharged on 6/24 patient of left leg swelling and pain negative for DVT at this time and prescribed prednisone for possible gout (unable to use NSAIDs due to kidney concerns) who presented on 06/05/2018 with hyperglycemic episode in setting of steroids.  She was recently discharged on 6/24 with instructions to take prednisone for her left knee pain for presumed gout ( NSAID contraindicated because of CKD) and Levemir 10 U plus a SGLT-2 for her diabetes. She was unable to fill her onglyza  as the pharmacy didn't have any. She stopped taking the prednisone after a few days as she noticed her glucose levels were in the 400s. She saw an orthopedist on Thursday and was given a steroid injection into her knee with little improvement in pain and found her glucose at home to be 600 prompting her to come to the ED for evaluation.   Otherwise per her CKD she has noticed some decrease in urination but no pain.    Hospital Course:   Type 2 diabetes with hyperglycemia, likely induced by steroids.  Prior to admission patient received intra-articular steroid injection to the knee preceded by 2 days of oral prednisone therapy.  Initial glucose greater than 600 with no anion gap quickly improved while insulin drip.  Patient tolerated resuming home dose of Levemir 8 units with blood glucose in 160s.  A1c 8.4.  Instructed to avoid further steroid use/steroid injections.  Acute hyponatremia, secondary to hypoglycemia, resolved with IV fluids and correction of hypoglycemia as mentioned above  AKI on CKD with non-anion gap metabolic acidosis, stage IV.  Likely to prerenal etiology in setting of dehydration related to hyperglycemia as mentioned above.  Repeat BMP showed creatinine downtrending.  Briefly discussed with nephrology who will arrange close follow-up as outpatient.  Patient instructed to avoid nephrotoxic agents on discharge.  Patient still making urine.  Acute on chronic anemia.  Likely secondary to anemia of chronic disease.  Patient did require 1 unit packed red blood cells for hemoglobin 7.3, returned to baseline  after transfusion.  No episodes of bleeding during hospital stay.  Leukocytosis, resolved.  Likely demargination related to prednisone therapy. UA and chest x-ray both negative for infection  Left knee pain with significant left leg swelling. Initially presumed to be inflammatory arthritis however no improvement with prednisone or intra-articular steroid injection as an outpatient.   CT leg for evaluation of swelling show moderate circumferential soft tissue swelling of the entire leg.  Venous duplex 1 week prior was negative for clot.  No erythema or tenderness to suggest cellulitis.  Suspect venous insufficiency as patient reports improvement in swelling with elevation.  Encouraged to do the same, compression stockings, close follow-up with PCP.  Consultations:  None  Procedures/Studies: none  Discharge Exam: BP 123/72   Pulse 79   Temp 98.3 F (36.8 C) (Oral)   Resp 10   Ht 5\' 4"  (1.626 m)   Wt 71.9 kg (158 lb 8.2 oz)   LMP 12/08/2013   SpO2 98%   BMI 27.21 kg/m   General: Lying in bed, no apparent distress Eyes: EOMI, anicteric ENT: Oral Mucosa clear and moist Cardiovascular: regular rate and rhythm, no murmurs, rubs or gallops Respiratory: Normal respiratory effort, lungs clear to auscultation bilaterally Abdomen: soft, non-distended, non-tender, normal bowel sounds MSK: tender to palpation right knee with some warmth but good movement. 1+ pitting edema of left leg ( ankle to sacrum)  Neurologic: Grossly no focal neuro deficit.Mental status AAOx3, speech normal, Psychiatric:Appropriate affect, and mood   Discharge Instructions You were cared for by a hospitalist during your hospital stay. If you have any questions about your discharge medications or the care you received while you were in the hospital after you are discharged, you can call the unit and asked to speak with the hospitalist on call if the hospitalist that took care of you is not available. Once you are discharged, your primary care physician will handle any further medical issues. Please note that NO REFILLS for any discharge medications will be authorized once you are discharged, as it is imperative that you return to your primary care physician (or establish a relationship with a primary care physician if you do not have one) for your aftercare needs so that they can reassess your need for  medications and monitor your lab values.   Allergies as of 06/07/2018   No Known Allergies     Medication List    STOP taking these medications   HYDROcodone-acetaminophen 5-325 MG tablet Commonly known as:  NORCO   predniSONE 20 MG tablet Commonly known as:  DELTASONE     TAKE these medications   acetaminophen 325 MG tablet Commonly known as:  TYLENOL Take 2 tablets (650 mg total) by mouth every 6 (six) hours as needed for mild pain (or Fever >/= 101).   amLODipine 10 MG tablet Commonly known as:  NORVASC Take 1 tablet (10 mg total) by mouth daily.   atorvastatin 40 MG tablet Commonly known as:  LIPITOR Take 1 tablet (40 mg total) by mouth daily at 6 PM.   carvedilol 12.5 MG tablet Commonly known as:  COREG Take 1 tablet (12.5 mg total) by mouth 2 (two) times daily. What changed:  when to take this   Insulin Detemir 100 UNIT/ML Pen Commonly known as:  LEVEMIR Inject 8 Units into the skin daily at 10 pm. What changed:    how much to take  when to take this   saxagliptin HCl 5 MG Tabs tablet Commonly known as:  ONGLYZA  Take 0.5 tablets (2.5 mg total) by mouth daily. For Diabetes What changed:  how much to take   sodium bicarbonate 650 MG tablet Take 1 tablet (650 mg total) by mouth 3 (three) times daily.      No Known Allergies    The results of significant diagnostics from this hospitalization (including imaging, microbiology, ancillary and laboratory) are listed below for reference.    Significant Diagnostic Studies: Dg Chest 2 View  Result Date: 06/05/2018 CLINICAL DATA:  Patient with low blood sugar. EXAM: CHEST - 2 VIEW COMPARISON:  Chest CT 10/06/2013 FINDINGS: Monitoring leads overlie the patient. Cardiomegaly. Bibasilar heterogeneous pulmonary opacities. No pleural effusion or pneumothorax. Thoracic spine degenerative changes. IMPRESSION: No acute cardiopulmonary process. Cardiomegaly. Basilar atelectasis. Electronically Signed   By: Lovey Newcomer  M.D.   On: 06/05/2018 10:32   Dg Knee 1-2 Views Left  Result Date: 05/28/2018 CLINICAL DATA:  57 y/o  F; pain and edema in the left knee. EXAM: LEFT KNEE - 1-2 VIEW COMPARISON:  05/27/2018 left knee radiograph. FINDINGS: No evidence of fracture, dislocation, or joint effusion. Small inferior patellar enthesophyte. Stable mild medial femorotibial compartment joint space narrowing. IMPRESSION: 1.  No acute fracture or dislocation identified. 2. Stable mild narrowing of the medial femorotibial compartment. Electronically Signed   By: Kristine Garbe M.D.   On: 05/28/2018 05:54   US Renal  Result Date: 05/29/2018 CLINICAL DATA:  Chronic kidney disease EXAM: RENAL / URINARY TRACT ULTRASOUND COMPLETE COMPARISON:  Ultrasound 07/23/2017 FINDINGS: Right Kidney: Length: 10.4 cm. Increased cortical echogenicity. No hydronephrosis or focal abnormality Left Kidney: Length: 9.3 cm. Increased cortical echogenicity. No hydronephrosis or focal abnormality. Bladder: Appears normal for degree of bladder distention. IMPRESSION: 1. Increased cortical echogenicity suggesting medical renal disease. No hydronephrosis. 2. Otherwise negative renal sonogram Electronically Signed   By: Donavan Foil M.D.   On: 05/29/2018 19:31    Microbiology: No results found for this or any previous visit (from the past 240 hour(s)).   Labs: Basic Metabolic Panel: Recent Labs  Lab 05/31/18 2202 06/05/18 0746 06/06/18 0327 06/07/18 0400  NA 134* 126* 135 137  K 4.3 4.9 4.1 4.4  CL 107 99 108 110  CO2 18* 17* 18* 16*  GLUCOSE 457* 846* 163* 139*  BUN 66* 64* 63* 70*  CREATININE 4.17* 4.24* 4.50* 4.47*  CALCIUM 8.9 8.5* 8.3* 8.4*  PHOS  --   --  4.8* 5.1*   Liver Function Tests: Recent Labs  Lab 06/06/18 0327  ALBUMIN 1.9*   No results for input(s): LIPASE, AMYLASE in the last 168 hours. No results for input(s): AMMONIA in the last 168 hours. CBC: Recent Labs  Lab 05/31/18 2202 06/05/18 0746 06/06/18 0327  06/06/18 1551 06/07/18 0400  WBC 13.7* 15.6* 12.8* 10.8* 10.2  HGB 8.6* 8.7* 7.3* 9.2* 9.2*  HCT 27.7* 28.3* 24.0* 28.9* 29.6*  MCV 73.1* 73.9* 73.4* 74.9* 75.7*  PLT 306 307 257 267 270   Cardiac Enzymes: No results for input(s): CKTOTAL, CKMB, CKMBINDEX, TROPONINI in the last 168 hours. BNP: BNP (last 3 results) No results for input(s): BNP in the last 8760 hours.  ProBNP (last 3 results) No results for input(s): PROBNP in the last 8760 hours.  CBG: Recent Labs  Lab 06/06/18 1736 06/06/18 2018 06/07/18 0011 06/07/18 0418 06/07/18 0748  GLUCAP 289* 316* 232* 136* 108*       Signed:  Desiree Hane, MD [facility] 06/07/2018, 11:46 AM

## 2018-06-07 NOTE — Plan of Care (Signed)
PT will go home on Levemir, pt will follow up with pmd and ask for endocrinologist and follow up with knee pain

## 2018-06-07 NOTE — Progress Notes (Signed)
Nsg Discharge Note  Admit Date:  06/05/2018 Discharge date: 06/07/2018   Colleen [name] to be D/C'd Home per MD order.  AVS completed.  Copy for chart, and copy for patient signed, and dated. Patient/caregiver able to verbalize understanding.  Discharge Medication: Allergies as of 06/07/2018   No Known Allergies     Medication List    STOP taking these medications   HYDROcodone-acetaminophen 5-325 MG tablet Commonly known as:  NORCO   predniSONE 20 MG tablet Commonly known as:  DELTASONE     TAKE these medications   acetaminophen 325 MG tablet Commonly known as:  TYLENOL Take 2 tablets (650 mg total) by mouth every 6 (six) hours as needed for mild pain (or Fever >/= 101).   amLODipine 10 MG tablet Commonly known as:  NORVASC Take 1 tablet (10 mg total) by mouth daily.   atorvastatin 40 MG tablet Commonly known as:  LIPITOR Take 1 tablet (40 mg total) by mouth daily at 6 PM.   carvedilol 12.5 MG tablet Commonly known as:  COREG Take 1 tablet (12.5 mg total) by mouth 2 (two) times daily. What changed:  when to take this   Insulin Detemir 100 UNIT/ML Pen Commonly known as:  LEVEMIR Inject 8 Units into the skin daily at 10 pm. What changed:    how much to take  when to take this   saxagliptin HCl 5 MG Tabs tablet Commonly known as:  ONGLYZA Take 0.5 tablets (2.5 mg total) by mouth daily. For Diabetes What changed:  how much to take   sodium bicarbonate 650 MG tablet Take 1 tablet (650 mg total) by mouth 3 (three) times daily.       Discharge Assessment: Vitals:   06/07/18 1231 06/07/18 1600  BP: 131/67   Pulse: 80 84  Resp: 15 15  Temp: 97.9 F (36.6 C) 97.9 F (36.6 C)  SpO2: 97% 99%   Skin clean, dry and intact without evidence of skin break down, no evidence of skin tears noted. IV catheter discontinued intact. Site without signs and symptoms of complications - no redness or edema noted at insertion site, patient denies c/o pain - only slight  tenderness at site.  Dressing with slight pressure applied.  D/c Instructions-Education: Discharge instructions given to patient/family with verbalized understanding. D/c education completed with patient/family including follow up instructions, medication list, d/c activities limitations if indicated, with other d/c instructions as indicated by MD - patient able to verbalize understanding, all questions fully answered. Patient instructed to return to ED, call 911, or call MD for any changes in condition.  Patient escorted via [place], and D/C home via private auto.  Hassan Rowan, RN 06/07/2018 6:19 PM

## 2018-06-08 ENCOUNTER — Other Ambulatory Visit: Payer: Self-pay | Admitting: Orthopedic Surgery

## 2018-06-08 DIAGNOSIS — M25462 Effusion, left knee: Secondary | ICD-10-CM

## 2018-06-09 DIAGNOSIS — N179 Acute kidney failure, unspecified: Secondary | ICD-10-CM | POA: Diagnosis not present

## 2018-06-14 ENCOUNTER — Ambulatory Visit
Admission: RE | Admit: 2018-06-14 | Discharge: 2018-06-14 | Disposition: A | Discharge status: Home / Self Care | Payer: Federal, State, Local not specified - PPO | Source: Ambulatory Visit | Attending: Orthopedic Surgery | Admitting: Orthopedic Surgery

## 2018-06-14 DIAGNOSIS — M25562 Pain in left knee: Secondary | ICD-10-CM | POA: Diagnosis not present

## 2018-06-14 DIAGNOSIS — M25462 Effusion, left knee: Secondary | ICD-10-CM

## 2018-06-15 DIAGNOSIS — M25562 Pain in left knee: Secondary | ICD-10-CM | POA: Diagnosis not present

## 2018-06-15 DIAGNOSIS — Z09 Encounter for follow-up examination after completed treatment for conditions other than malignant neoplasm: Secondary | ICD-10-CM | POA: Diagnosis not present

## 2018-06-15 DIAGNOSIS — E1165 Type 2 diabetes mellitus with hyperglycemia: Secondary | ICD-10-CM | POA: Diagnosis not present

## 2018-06-15 DIAGNOSIS — I10 Essential (primary) hypertension: Secondary | ICD-10-CM | POA: Diagnosis not present

## 2018-06-16 DIAGNOSIS — N2581 Secondary hyperparathyroidism of renal origin: Secondary | ICD-10-CM | POA: Diagnosis not present

## 2018-06-16 DIAGNOSIS — N184 Chronic kidney disease, stage 4 (severe): Secondary | ICD-10-CM | POA: Diagnosis not present

## 2018-06-16 DIAGNOSIS — D631 Anemia in chronic kidney disease: Secondary | ICD-10-CM | POA: Diagnosis not present

## 2018-06-16 DIAGNOSIS — I129 Hypertensive chronic kidney disease with stage 1 through stage 4 chronic kidney disease, or unspecified chronic kidney disease: Secondary | ICD-10-CM | POA: Diagnosis not present

## 2018-06-16 DIAGNOSIS — N179 Acute kidney failure, unspecified: Secondary | ICD-10-CM | POA: Diagnosis not present

## 2018-06-17 DIAGNOSIS — M25562 Pain in left knee: Secondary | ICD-10-CM | POA: Diagnosis not present

## 2018-06-21 DIAGNOSIS — M25562 Pain in left knee: Secondary | ICD-10-CM | POA: Diagnosis not present

## 2018-06-29 ENCOUNTER — Other Ambulatory Visit: Payer: Self-pay | Admitting: Orthopedic Surgery

## 2018-06-29 DIAGNOSIS — M79652 Pain in left thigh: Secondary | ICD-10-CM

## 2018-07-01 DIAGNOSIS — K080 Exfoliation of teeth due to systemic causes: Secondary | ICD-10-CM | POA: Diagnosis not present

## 2018-07-03 ENCOUNTER — Ambulatory Visit
Admission: RE | Admit: 2018-07-03 | Discharge: 2018-07-03 | Disposition: A | Discharge status: Home / Self Care | Payer: Federal, State, Local not specified - PPO | Source: Ambulatory Visit | Attending: Orthopedic Surgery | Admitting: Orthopedic Surgery

## 2018-07-03 DIAGNOSIS — M79652 Pain in left thigh: Secondary | ICD-10-CM

## 2018-07-04 ENCOUNTER — Inpatient Hospital Stay
Admission: RE | Admit: 2018-07-04 | Discharge: 2018-07-04 | Disposition: A | Discharge status: Home / Self Care | Payer: Federal, State, Local not specified - PPO | Source: Ambulatory Visit | Attending: Orthopedic Surgery | Admitting: Orthopedic Surgery

## 2018-07-06 ENCOUNTER — Other Ambulatory Visit: Payer: Self-pay

## 2018-07-06 ENCOUNTER — Encounter (HOSPITAL_COMMUNITY): Payer: Self-pay

## 2018-07-06 ENCOUNTER — Emergency Department (HOSPITAL_COMMUNITY)
Admission: EM | Admit: 2018-07-06 | Discharge: 2018-07-06 | Disposition: A | Discharge status: Home / Self Care | Payer: Federal, State, Local not specified - PPO | Attending: Emergency Medicine | Admitting: Emergency Medicine

## 2018-07-06 ENCOUNTER — Ambulatory Visit (HOSPITAL_BASED_OUTPATIENT_CLINIC_OR_DEPARTMENT_OTHER)
Admission: RE | Admit: 2018-07-06 | Discharge: 2018-07-06 | Disposition: A | Discharge status: Home / Self Care | Payer: Federal, State, Local not specified - PPO | Source: Ambulatory Visit

## 2018-07-06 ENCOUNTER — Other Ambulatory Visit (HOSPITAL_COMMUNITY): Payer: Self-pay | Admitting: Orthopedic Surgery

## 2018-07-06 DIAGNOSIS — I82442 Acute embolism and thrombosis of left tibial vein: Secondary | ICD-10-CM | POA: Insufficient documentation

## 2018-07-06 DIAGNOSIS — Z794 Long term (current) use of insulin: Secondary | ICD-10-CM | POA: Diagnosis not present

## 2018-07-06 DIAGNOSIS — M25562 Pain in left knee: Secondary | ICD-10-CM | POA: Diagnosis not present

## 2018-07-06 DIAGNOSIS — E1122 Type 2 diabetes mellitus with diabetic chronic kidney disease: Secondary | ICD-10-CM | POA: Insufficient documentation

## 2018-07-06 DIAGNOSIS — R2242 Localized swelling, mass and lump, left lower limb: Secondary | ICD-10-CM | POA: Diagnosis not present

## 2018-07-06 DIAGNOSIS — I129 Hypertensive chronic kidney disease with stage 1 through stage 4 chronic kidney disease, or unspecified chronic kidney disease: Secondary | ICD-10-CM | POA: Insufficient documentation

## 2018-07-06 DIAGNOSIS — N184 Chronic kidney disease, stage 4 (severe): Secondary | ICD-10-CM | POA: Diagnosis not present

## 2018-07-06 DIAGNOSIS — R52 Pain, unspecified: Secondary | ICD-10-CM | POA: Insufficient documentation

## 2018-07-06 DIAGNOSIS — I82492 Acute embolism and thrombosis of other specified deep vein of left lower extremity: Secondary | ICD-10-CM | POA: Insufficient documentation

## 2018-07-06 DIAGNOSIS — Z79899 Other long term (current) drug therapy: Secondary | ICD-10-CM | POA: Diagnosis not present

## 2018-07-06 LAB — CBC WITH DIFFERENTIAL/PLATELET
Basophils Absolute: 0.1 10*3/uL (ref 0.0–0.1)
Basophils Relative: 1 %
Eosinophils Absolute: 0.1 10*3/uL (ref 0.0–0.7)
Eosinophils Relative: 1 %
HCT: 29.4 % — ABNORMAL LOW (ref 36.0–46.0)
Hemoglobin: 9.4 g/dL — ABNORMAL LOW (ref 12.0–15.0)
Lymphocytes Relative: 17 %
Lymphs Abs: 1.2 10*3/uL (ref 0.7–4.0)
MCH: 23.4 pg — ABNORMAL LOW (ref 26.0–34.0)
MCHC: 32 g/dL (ref 30.0–36.0)
MCV: 73.1 fL — ABNORMAL LOW (ref 78.0–100.0)
Monocytes Absolute: 0.7 10*3/uL (ref 0.1–1.0)
Monocytes Relative: 10 %
Neutro Abs: 5.2 10*3/uL (ref 1.7–7.7)
Neutrophils Relative %: 71 %
Platelets: 244 10*3/uL (ref 150–400)
RBC: 4.02 MIL/uL (ref 3.87–5.11)
RDW: 16.4 % — ABNORMAL HIGH (ref 11.5–15.5)
WBC: 7.2 10*3/uL (ref 4.0–10.5)

## 2018-07-06 LAB — COMPREHENSIVE METABOLIC PANEL
ALT: 9 U/L (ref 0–44)
AST: 17 U/L (ref 15–41)
Albumin: 2.7 g/dL — ABNORMAL LOW (ref 3.5–5.0)
Alkaline Phosphatase: 94 U/L (ref 38–126)
Anion gap: 8 (ref 5–15)
BUN: 45 mg/dL — ABNORMAL HIGH (ref 6–20)
CO2: 20 mmol/L — ABNORMAL LOW (ref 22–32)
Calcium: 9.3 mg/dL (ref 8.9–10.3)
Chloride: 115 mmol/L — ABNORMAL HIGH (ref 98–111)
Creatinine, Ser: 4.1 mg/dL — ABNORMAL HIGH (ref 0.44–1.00)
GFR calc Af Amer: 13 mL/min — ABNORMAL LOW (ref >60)
GFR calc non Af Amer: 11 mL/min — ABNORMAL LOW (ref >60)
Glucose, Bld: 141 mg/dL — ABNORMAL HIGH (ref 70–99)
Potassium: 4.1 mmol/L (ref 3.5–5.1)
Sodium: 143 mmol/L (ref 135–145)
Total Bilirubin: 0.5 mg/dL (ref 0.3–1.2)
Total Protein: 7.3 g/dL (ref 6.5–8.1)

## 2018-07-06 LAB — I-STAT TROPONIN, ED: Troponin i, poc: 0.01 ng/mL (ref 0.00–0.08)

## 2018-07-06 MED ORDER — ELIQUIS 5 MG VTE STARTER PACK: ORAL_TABLET | ORAL | 0 refills | Status: DC

## 2018-07-06 NOTE — ED Notes (Signed)
ED Provider at bedside. Darl Householder

## 2018-07-06 NOTE — Care Management Note (Signed)
Case Management Note  CM consulted for Eliquis co-pay card.  CM spoke with pt at beside about the card and pt requested CM assist her with activating the card.  CM and pt went through the phone activation process together.  Pt had no further questions.  Updated Dr. Darl Householder and primary RN.  No further CM needs noted at this time.  Aveon Colquhoun, Benjaman Lobe, RN 07/06/2018, 5:13 PM

## 2018-07-06 NOTE — ED Triage Notes (Signed)
Pt presents with c/o confirmed blood clots in her left leg. Pt was sent here for confirmation of the blood clots from her orthopedist and radiology confirmed them. Pt denies any chest pain or shortness of breath at this time.

## 2018-07-06 NOTE — Discharge Instructions (Signed)
Take eliquis as prescribed.   See your orthopedic doctor   Return to ER if you have worse leg pain or swelling, chest pain, trouble breathing

## 2018-07-06 NOTE — ED Provider Notes (Signed)
[facility] DEPT Provider Note   CSN: [id number] Arrival date & time: 07/06/18  1433     History   Chief Complaint Chief Complaint  Patient presents with  . Blood clot    HPI Colleen [name] is a 57 y.o. female history of diabetes, CKD, previous DVT but not on blood thinners, here presenting with left leg swelling, DVT.  Patient was recently admitted for gout. She noticed left leg swelling for the last 2 months.  She has worsening pain over the last several days.  Went to orthopedic doctor and was thought to have a DVT.  Patient was scheduled for DVT study and was positive for DVT on the left leg.  Patient denies any chest pain or shortness of breath or trouble breathing.  Patient has baseline renal failure.  Denies any recent travel but she was hospitalized for 3 days recently for gout.   The history is provided by the patient.    Past Medical History:  Diagnosis Date  . Chronic kidney disease   . Diabetes ([place])   . DVT (deep venous thrombosis) ([place])   . E-coli UTI   . Gout 06/05/2018  . HLD (hyperlipidemia) 06/05/2018  . PE (pulmonary embolism)   . 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 Acute kidney injury superimposed on CKD ([place]) 06/06/2018  . Left knee pain 06/06/2018  . Anemia due to chronic kidney disease 06/06/2018  . Hyponatremia 06/06/2018  . HLD (hyperlipidemia) 06/05/2018  . Hyperglycemia 06/05/2018  . Gout 06/05/2018  . Swelling of lower extremity 05/28/2018  . CKD (chronic kidney disease) stage 4, GFR 15-29 ml/min ([facility]) 05/28/2018  . Hypochromic microcytic anemia 05/28/2018  . Lower extremity edema 05/28/2018  . HTN (hypertension) 10/06/2013  . Acute pulmonary embolism ([place]) 10/06/2013  . Congestive dilated cardiomyopathy ([place]) 10/05/2013  . Carotid bruit 10/05/2013  . DM (diabetes mellitus), type 2, uncontrolled  ([place]) 10/04/2013  . Acute renal insufficiency 09/27/2013  . Septic shock(785.52) 09/25/2013  . Bacteremia 09/25/2013    Past Surgical History:  Procedure Laterality Date  . NO PAST SURGERIES       OB History   None      Home Medications    Prior to Admission medications   Medication Sig Start Date End Date Taking? Authorizing Provider  amLODipine (NORVASC) 10 MG tablet Take 1 tablet (10 mg total) by mouth daily. 05/30/18  Yes Emokpae, Courage, MD  atorvastatin (LIPITOR) 40 MG tablet Take 1 tablet (40 mg total) by mouth daily at 6 PM. 05/30/18  Yes Emokpae, Courage, MD  calcitRIOL (ROCALTROL) 0.25 MCG capsule Take 0.25 mcg by mouth every Monday, Wednesday, and Friday.   Yes [provider]  carvedilol (COREG) 12.5 MG tablet Take 1 tablet (12.5 mg total) by mouth 2 (two) times daily. Patient taking differently: Take 6.25 mg by mouth daily.  05/30/18  Yes Roxan Hockey, MD  Insulin Detemir (LEVEMIR) 100 UNIT/ML Pen Inject 8 Units into the skin daily at 10 pm. Patient taking differently: Inject 10 Units into the skin every evening.  05/30/18  Yes Emokpae, Courage, MD  saxagliptin HCl (ONGLYZA) 5 MG TABS tablet Take 0.5 tablets (2.5 mg total) by mouth daily. For Diabetes Patient taking differently: Take 5 mg by mouth daily. For Diabetes 06/07/18  Yes Oretha Milch D, MD  acetaminophen (TYLENOL) 325 MG tablet Take  2 tablets (650 mg total) by mouth every 6 (six) hours as needed for mild pain (or Fever >/= 101). 05/30/18   Emokpae, Courage, MD  ELIQUIS STARTER PACK (ELIQUIS STARTER PACK) 5 MG TABS Take as directed on package: start with two-5mg  tablets twice daily for 7 days. On day 8, switch to one-5mg  tablet twice daily. 07/06/18   Drenda Freeze, MD  sodium bicarbonate 650 MG tablet Take 1 tablet (650 mg total) by mouth 3 (three) times daily. Patient not taking: Reported on 07/06/2018 06/07/18   Desiree Hane, MD    Family History Family History  Problem Relation Age of  Onset  . CAD Neg Hx     Social History Social History   Tobacco Use  . Smoking status: Never Smoker  . Smokeless tobacco: Never Used  Substance Use Topics  . Alcohol use: No  . Drug use: No     Allergies   Patient has no known allergies.   Review of Systems Review of Systems  Musculoskeletal:       Left leg swelling and pain   All other systems reviewed and are negative.    Physical Exam Updated Vital Signs BP (!) 145/68   Pulse 97   Temp 98.2 F (36.8 C) (Oral)   Resp 13   Ht 5\' 4"  (1.626 m)   Wt 74.8 kg (165 lb)   LMP 12/08/2013   SpO2 100%   BMI 28.32 kg/m   Physical Exam  Constitutional: She is oriented to person, place, and time. She appears well-developed.  HENT:  Head: Normocephalic.  Mouth/Throat: Oropharynx is clear and moist.  Eyes: Pupils are equal, round, and reactive to light. Conjunctivae and EOM are normal.  Neck: Normal range of motion. Neck supple.  Cardiovascular: Normal rate, regular rhythm and normal heart sounds.  Pulmonary/Chest: Effort normal and breath sounds normal. No stridor. No respiratory distress. She has no wheezes.  Abdominal: Soft. Bowel sounds are normal. She exhibits no distension. There is no tenderness.  Musculoskeletal:  + L calf tenderness and 1+ edema. Nl pulses   Neurological: She is alert and oriented to person, place, and time.  Skin: Skin is warm.  Psychiatric: She has a normal mood and affect.  Nursing note and vitals reviewed.    ED Treatments / Results  Labs (all labs ordered are listed, but only abnormal results are displayed) Labs Reviewed  CBC WITH DIFFERENTIAL/PLATELET - Abnormal; Notable for the following components:      Result Value   Hemoglobin 9.4 (*)    HCT 29.4 (*)    MCV 73.1 (*)    MCH 23.4 (*)    RDW 16.4 (*)    All other components within normal limits  COMPREHENSIVE METABOLIC PANEL - Abnormal; Notable for the following components:   Chloride 115 (*)    CO2 20 (*)    Glucose, Bld  141 (*)    BUN 45 (*)    Creatinine, Ser 4.10 (*)    Albumin 2.7 (*)    GFR calc non Af Amer 11 (*)    GFR calc Af Amer 13 (*)    All other components within normal limits  I-STAT TROPONIN, ED    EKG EKG Interpretation  Date/Time:  Tuesday July 06 2018 16:56:33 EDT Ventricular Rate:  96 PR Interval:    QRS Duration: 82 QT Interval:  360 QTC Calculation: 455 R Axis:   32 Text Interpretation:  Sinus rhythm Anteroseptal infarct, old No significant change since last  tracing Confirmed by Wandra Arthurs [phone]) on 07/06/2018 5:12:23 PM   Radiology No results found.  Procedures Procedures (including critical care time)  Medications Ordered in ED Medications - No data to display   Initial Impression / Assessment and Plan / ED Course  I have reviewed the triage vital signs and the nursing notes.  Pertinent labs & imaging results that were available during my care of the patient were reviewed by me and considered in my medical decision making (see chart for details).     Colleen [name] is a 57 y.o. female here with L calf pain confirmed on [name]. No chest pain or SOB. Patient has baseline renal failure. Will call pharmacy and case management regarding anticoagulation for DVT in the setting of renal failure.   5:19 PM Cr 4.1, baseline. I called our pharmacist regarding options. Eliquis seemed the best option and doesn't require dosage adjustment in renal failure. Will try starter pack. Told him to follow up with ortho, PCP.    Final Clinical Impressions(s) / ED Diagnoses   Final diagnoses:  None    ED Discharge Orders        Ordered    ELIQUIS STARTER PACK [facility] STARTER PACK) 5 MG TABS     07/06/18 1707       Drenda Freeze, MD 07/06/18 1725

## 2018-07-06 NOTE — Progress Notes (Addendum)
*  Preliminary Results* Left lower extremity venous duplex completed. Left lower extremity is positive for acute deep vein thrombosis involving the left posterior tibial and peroneal veins. There is no evidence of left Baker's cyst.  Preliminary results discussed with Joanell Rising, PA-C.  07/06/2018 2:27 PM Maudry Mayhew, BS, RVT, RDCS, RDMS

## 2018-07-13 ENCOUNTER — Ambulatory Visit: Payer: Federal, State, Local not specified - PPO | Admitting: *Deleted

## 2018-07-14 DIAGNOSIS — N185 Chronic kidney disease, stage 5: Secondary | ICD-10-CM | POA: Diagnosis not present

## 2018-07-14 DIAGNOSIS — I120 Hypertensive chronic kidney disease with stage 5 chronic kidney disease or end stage renal disease: Secondary | ICD-10-CM | POA: Diagnosis not present

## 2018-07-14 DIAGNOSIS — D631 Anemia in chronic kidney disease: Secondary | ICD-10-CM | POA: Diagnosis not present

## 2018-07-14 DIAGNOSIS — E1129 Type 2 diabetes mellitus with other diabetic kidney complication: Secondary | ICD-10-CM | POA: Diagnosis not present

## 2018-07-29 DIAGNOSIS — M25562 Pain in left knee: Secondary | ICD-10-CM | POA: Diagnosis not present

## 2018-08-17 DIAGNOSIS — D631 Anemia in chronic kidney disease: Secondary | ICD-10-CM | POA: Diagnosis not present

## 2018-08-17 DIAGNOSIS — I120 Hypertensive chronic kidney disease with stage 5 chronic kidney disease or end stage renal disease: Secondary | ICD-10-CM | POA: Diagnosis not present

## 2018-08-17 DIAGNOSIS — N185 Chronic kidney disease, stage 5: Secondary | ICD-10-CM | POA: Diagnosis not present

## 2018-08-17 DIAGNOSIS — N2581 Secondary hyperparathyroidism of renal origin: Secondary | ICD-10-CM | POA: Diagnosis not present

## 2018-08-18 DIAGNOSIS — H4312 Vitreous hemorrhage, left eye: Secondary | ICD-10-CM | POA: Diagnosis not present

## 2018-08-18 DIAGNOSIS — E113593 Type 2 diabetes mellitus with proliferative diabetic retinopathy without macular edema, bilateral: Secondary | ICD-10-CM | POA: Diagnosis not present

## 2018-08-18 DIAGNOSIS — H2513 Age-related nuclear cataract, bilateral: Secondary | ICD-10-CM | POA: Diagnosis not present

## 2018-08-18 DIAGNOSIS — E113533 Type 2 diabetes mellitus with proliferative diabetic retinopathy with traction retinal detachment not involving the macula, bilateral: Secondary | ICD-10-CM | POA: Diagnosis not present

## 2018-08-24 ENCOUNTER — Telehealth: Payer: Self-pay | Admitting: *Deleted

## 2018-08-24 ENCOUNTER — Other Ambulatory Visit: Payer: Self-pay | Admitting: Hematology

## 2018-08-24 ENCOUNTER — Inpatient Hospital Stay: Payer: Federal, State, Local not specified - PPO

## 2018-08-24 ENCOUNTER — Other Ambulatory Visit: Payer: Self-pay

## 2018-08-24 ENCOUNTER — Encounter: Payer: Self-pay | Admitting: Hematology

## 2018-08-24 ENCOUNTER — Inpatient Hospital Stay: Payer: Federal, State, Local not specified - PPO | Attending: Family | Admitting: Hematology

## 2018-08-24 DIAGNOSIS — I82492 Acute embolism and thrombosis of other specified deep vein of left lower extremity: Secondary | ICD-10-CM

## 2018-08-24 DIAGNOSIS — I129 Hypertensive chronic kidney disease with stage 1 through stage 4 chronic kidney disease, or unspecified chronic kidney disease: Secondary | ICD-10-CM | POA: Diagnosis not present

## 2018-08-24 DIAGNOSIS — Z7901 Long term (current) use of anticoagulants: Secondary | ICD-10-CM | POA: Diagnosis not present

## 2018-08-24 DIAGNOSIS — I82409 Acute embolism and thrombosis of unspecified deep veins of unspecified lower extremity: Secondary | ICD-10-CM | POA: Insufficient documentation

## 2018-08-24 DIAGNOSIS — N184 Chronic kidney disease, stage 4 (severe): Secondary | ICD-10-CM | POA: Diagnosis not present

## 2018-08-24 DIAGNOSIS — Z794 Long term (current) use of insulin: Secondary | ICD-10-CM | POA: Insufficient documentation

## 2018-08-24 DIAGNOSIS — D631 Anemia in chronic kidney disease: Secondary | ICD-10-CM | POA: Insufficient documentation

## 2018-08-24 DIAGNOSIS — E1122 Type 2 diabetes mellitus with diabetic chronic kidney disease: Secondary | ICD-10-CM | POA: Insufficient documentation

## 2018-08-24 DIAGNOSIS — I2699 Other pulmonary embolism without acute cor pulmonale: Secondary | ICD-10-CM

## 2018-08-24 DIAGNOSIS — I82402 Acute embolism and thrombosis of unspecified deep veins of left lower extremity: Secondary | ICD-10-CM

## 2018-08-24 DIAGNOSIS — E1165 Type 2 diabetes mellitus with hyperglycemia: Secondary | ICD-10-CM | POA: Diagnosis not present

## 2018-08-24 DIAGNOSIS — H5440 Blindness, one eye, unspecified eye: Secondary | ICD-10-CM | POA: Insufficient documentation

## 2018-08-24 DIAGNOSIS — I10 Essential (primary) hypertension: Secondary | ICD-10-CM

## 2018-08-24 DIAGNOSIS — I82442 Acute embolism and thrombosis of left tibial vein: Secondary | ICD-10-CM | POA: Diagnosis not present

## 2018-08-24 LAB — CBC WITH DIFFERENTIAL (CANCER CENTER ONLY)
Basophils Absolute: 0 10*3/uL (ref 0.0–0.1)
Basophils Relative: 1 %
Eosinophils Absolute: 0.2 10*3/uL (ref 0.0–0.5)
Eosinophils Relative: 2 %
HCT: 28 % — ABNORMAL LOW (ref 34.8–46.6)
Hemoglobin: 8.7 g/dL — ABNORMAL LOW (ref 11.6–15.9)
Lymphocytes Relative: 16 %
Lymphs Abs: 1.2 10*3/uL (ref 0.9–3.3)
MCH: 23.3 pg — ABNORMAL LOW (ref 26.0–34.0)
MCHC: 31.1 g/dL — ABNORMAL LOW (ref 32.0–36.0)
MCV: 75.1 fL — ABNORMAL LOW (ref 81.0–101.0)
Monocytes Absolute: 0.7 10*3/uL (ref 0.1–0.9)
Monocytes Relative: 9 %
Neutro Abs: 5.3 10*3/uL (ref 1.5–6.5)
Neutrophils Relative %: 72 %
Platelet Count: 247 10*3/uL (ref 145–400)
RBC: 3.73 MIL/uL (ref 3.70–5.32)
RDW: 16.1 % — ABNORMAL HIGH (ref 11.1–15.7)
WBC Count: 7.4 10*3/uL (ref 3.9–10.0)

## 2018-08-24 LAB — CMP (CANCER CENTER ONLY)
ALT: 7 U/L (ref 0–44)
AST: 19 U/L (ref 15–41)
Albumin: 3.1 g/dL — ABNORMAL LOW (ref 3.5–5.0)
Alkaline Phosphatase: 99 U/L (ref 38–126)
Anion gap: 10 (ref 5–15)
BUN: 36 mg/dL — ABNORMAL HIGH (ref 6–20)
CO2: 20 mmol/L — ABNORMAL LOW (ref 22–32)
Calcium: 9.6 mg/dL (ref 8.9–10.3)
Chloride: 114 mmol/L — ABNORMAL HIGH (ref 98–111)
Creatinine: 4.83 mg/dL (ref 0.44–1.00)
GFR, Est AFR Am: 11 mL/min — ABNORMAL LOW (ref >60)
GFR, Estimated: 9 mL/min — ABNORMAL LOW (ref >60)
Glucose, Bld: 95 mg/dL (ref 70–99)
Potassium: 4.9 mmol/L (ref 3.5–5.1)
Sodium: 144 mmol/L (ref 135–145)
Total Bilirubin: 0.3 mg/dL (ref 0.3–1.2)
Total Protein: 7.9 g/dL (ref 6.5–8.1)

## 2018-08-24 LAB — FOLATE: Folate: 9.3 ng/mL (ref >5.9)

## 2018-08-24 LAB — VITAMIN B12: Vitamin B-12: 213 pg/mL (ref 180–914)

## 2018-08-24 NOTE — Assessment & Plan Note (Signed)
BP borderline elevated today. Patient is followed at [facility] primary care.   I encourage the patient to continue close follow-up with her primary care physician for the management of HTN.

## 2018-08-24 NOTE — Assessment & Plan Note (Signed)
Patient is currently followed by Dr. Madelon Lips at [facility].   The need for dialysis has been discussed with the patient per her recent clinic note.  In addition, patient received a phone call from her nephrologist's office regarding ESA and she has an appt to discuss ESA. We will defer the management.

## 2018-08-24 NOTE — Assessment & Plan Note (Addendum)
This most episode of blood clot appeared to be provoked. We discussed about the pros and cons about testing for thrombophilia disorder. her current anticoagulation therapy will interfere with some the tests and it is not possible to interpret the test results.  Taking her off the anticoagulation therapy to do the tests may precipitate another thrombotic event. I do not see a reason to order excessive testing to screen for thrombophilia disorder as it would not change our management, as this is the patient's second episode of VTE (PTE in 2014) and therefore would warrant lifetime anticoagulation.    We discussed risks and benefits of anticoagulation, including risks of life-threatening bleeding/hospitalization, reversibility of each agent in the event of bleeding or overdose, safety profile of various pharmacologic agents and taking into account other social issues such as ease of administration of medications, etc. Given her Stage IV CKD and that she is tolerating Eliquis well without any significant side effects, we will continue Eliquis for the treatment of LLE DVT for at least 6-12 months. If she does not develop any sign or symptom of recurrent VTE at that time, we can consider reducing the dose of Eliquis for secondary ppx.   I recommend the patient to use elastic compression stockings at 20-30 mmHg to reduce risks of chronic thrombophlebitis.  Another main issue we discussed today included the role of screening other family members for thrombophilia disorder. At present time, I would not recommend testing the patient's family members as it would not benefit them.  Thrombophilia disorder is a genetic predisposition which increases an individual's risk for a thrombotic event, NOT a disease.  We discussed the implications of genetic screening including the possibility of uninsurability, costs involved, emotional distress and possible discrimination at various levels for the affected individual.  Rather than  genetic screening, one can possibly benefit from genetic counseling or dissemination of appropriate reading materials to educate other family members.  Finally, at the end of our consultation today, I reinforced the importance of preventive strategies such as avoiding hormonal supplement, avoiding cigarette smoking, keeping up-to-date with screening programs for early cancer detection, frequent ambulation for long distance travel and aggressive DVT prophylaxis in all surgical settings.

## 2018-08-24 NOTE — Assessment & Plan Note (Signed)
Patient reports that her glucose is relatively under control in the morning, but can fluctuate depending on what she eats throughout the day. I encourage patient to monitor her glucose closely and follow-up with her primary care physician for the management of diabetes

## 2018-08-24 NOTE — Progress Notes (Addendum)
[facility] CONSULT NOTE  Patient Care Team: Damaris Hippo, MD (Inactive) as PCP - General (Family Medicine)  ASSESSMENT & PLAN:  DVT (deep venous thrombosis) ([place]) This most episode of blood clot appeared to be provoked. We discussed about the pros and cons about testing for thrombophilia disorder. her current anticoagulation therapy will interfere with some the tests and it is not possible to interpret the test results.  Taking her off the anticoagulation therapy to do the tests may precipitate another thrombotic event. I do not see a reason to order excessive testing to screen for thrombophilia disorder as it would not change our management, as this is the patient's second episode of VTE (PTE in 2014) and therefore would warrant lifetime anticoagulation.    We discussed risks and benefits of anticoagulation, including risks of life-threatening bleeding/hospitalization, reversibility of each agent in the event of bleeding or overdose, safety profile of various pharmacologic agents and taking into account other social issues such as ease of administration of medications, etc. Given her Stage IV CKD and that she is tolerating Eliquis well without any significant side effects, we will continue Eliquis for the treatment of LLE DVT for at least 6-12 months. If she does not develop any sign or symptom of recurrent VTE at that time, we can consider reducing the dose of Eliquis for secondary ppx.   I recommend the patient to use elastic compression stockings at 20-30 mmHg to reduce risks of chronic thrombophlebitis.  Another main issue we discussed today included the role of screening other family members for thrombophilia disorder. At present time, I would not recommend testing the patient's family members as it would not benefit them.  Thrombophilia disorder is a genetic predisposition which increases an individual's risk for a thrombotic event, NOT a disease.  We discussed the implications  of genetic screening including the possibility of uninsurability, costs involved, emotional distress and possible discrimination at various levels for the affected individual.  Rather than genetic screening, one can possibly benefit from genetic counseling or dissemination of appropriate reading materials to educate other family members.  Finally, at the end of our consultation today, I reinforced the importance of preventive strategies such as avoiding hormonal supplement, avoiding cigarette smoking, keeping up-to-date with screening programs for early cancer detection, frequent ambulation for long distance travel and aggressive DVT prophylaxis in all surgical settings.  Anemia due to chronic kidney disease Hgb 8.7 today, which is slightly lower than the recent labs done at her nephrologist's office.   Pt had received a phone call from her nephrologist's office this week regarding a return visit to discuss a starting date for ESA.   Given that the CKD-related anemia is being managed by the patient's nephrologist, we will defer the management at this time.   Iron profile was consistent with anemia chronic disease.  Will assess patient's response to ESA at the next visit and if she has inadequate response to ESA, we can consider checking soluble transferrin to rule out concurrent iron deficiency anemia.  Blindness of left eye Patient reports having been evaluated by an ophthalmologist recently, who recommended "eye repair," but the surgery was put on hold due to being on anticoagulation.   I discussed with the patient the risks and benefits of holding anticoagulation for procedures. While there is a risk for recurrent VTE from withholding anticoagulation, the left eye blindness is causing significant impact on her quality of life. If there is a chance to improve her vision with her eye  surgery, I think it is reasonable to hold anticoagulation for 3-5 days prior to the surgery, and may be resumed at  the earliest time at the discretion of the ophthalmologist based on the risk of bleeding.   The patient is given information to provide to her ophthalmologist and we will be happy to discuss her case further prior to the surgery as needed.   HTN (hypertension) BP borderline elevated today. Patient is followed at [facility] primary care.   I encourage the patient to continue close follow-up with her primary care physician for the management of HTN.   CKD (chronic kidney disease) stage 4, GFR 15-29 ml/min [facility]) Patient is currently followed by Dr. Madelon Lips at [facility].   The need for dialysis has been discussed with the patient per her recent clinic note.  In addition, patient received a phone call from her nephrologist's office regarding ESA and she has an appt to discuss ESA. We will defer the management.   DM (diabetes mellitus), type 2, uncontrolled ([place]) Patient reports that her glucose is relatively under control in the morning, but can fluctuate depending on what she eats throughout the day. I encourage patient to monitor her glucose closely and follow-up with her primary care physician for the management of diabetes  Long term (current) use of anticoagulants The patient's nephrologist is planning to place vascular access in anticipation of dialysis.  I discussed w/ the pt's nephrologist the potential risks of recurrent VTE's.  Given that the patient has completed almost three months of therapeutic anticoagulation, I recommended that Eliquis can be held 3 days prior to high-bleeding risk procedure (such as AV graft) and may be resumed 3 days after the procedure (assuming achieving adequate hemostasis).   A total of more than 45 minutes were spent face-to-face with the patient during this encounter and over half of that time was spent on counseling and coordination of care as outlined above. The total time spent in the appointment was 60 minutes.   All questions  were answered. The patient knows to call the clinic with any problems, questions or concerns.  Return to clinic in 3 months for monitoring on anticoagulation.  Tish Men, MD 09/07/2018 4:30 PM   CHIEF COMPLAINTS/PURPOSE OF CONSULTATION:  "I am here for the blood clot in my left leg"  HISTORY OF PRESENTING ILLNESS:  Colleen [name] 57 y.o. female is here because of recently diagnosed left lower extremity DVT and evaluation for possible hypercoagulable work-up.   Ms. Mutch reports that she was bedbound for at least 19-month after meniscus tear in her left knee in May 2019.  She began to notice progressive left lower extremity swelling and eventually presented to her orthopedic surgeon after 2 months in July 2019.  A Doppler of the lower extremity was ordered and showed acute DVT in the left posterior tibial and peroneal veins, for which she was started on Eliquis based on her renal dysfunction.  She reports tolerating Eliquis well without any significant abnormal bruising or bleeding, such as epistaxis, hematemesis, hemoptysis, hematuria, hematochezia, or melena.  Overall, her left lower extremity swelling has improved significantly.  In addition, patient reports that over the past 7 days she had not been able to see out of her left eye, for which she had been evaluated by an ophthalmologist, who recommended "eye repair".  However, the procedure has been placed on hold due to her being on anticoagulation.  Furthermore, patient has been followed by Madelon Lips for her chronic  kidney disease, and she received a phone call from her nephrologist office this week regarding starting Aranesp.  She has an appointment to discuss ESA with her nephrologist in the coming week.  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  I have reviewed her chart and materials related to her cancer extensively and collaborated history with the patient. Summary of oncologic history is  as follows:  No history exists.    MEDICAL HISTORY:  Past Medical History:  Diagnosis Date  . Chronic kidney disease   . Diabetes ([place])   . DVT (deep venous thrombosis) ([place])   . E-coli UTI   . Gout 06/05/2018  . HLD (hyperlipidemia) 06/05/2018  . PE (pulmonary embolism)   . Pulled muscle    pt has right sided facial droop from pulled muscle in face since birth    SURGICAL HISTORY: Past Surgical History:  Procedure Laterality Date  . NO PAST SURGERIES      SOCIAL HISTORY: Social History   Socioeconomic History  . Marital status: Single    Spouse name: Not on file  . Number of children: Not on file  . Years of education: Not on file  . Highest education level: Not on file  Occupational History  . Not on file  Social Needs  . Financial resource strain: Not on file  . Food insecurity:    Worry: Not on file    Inability: Not on file  . Transportation needs:    Medical: Not on file    Non-medical: Not on file  Tobacco Use  . Smoking status: Never Smoker  . Smokeless tobacco: Never Used  Substance and Sexual Activity  . Alcohol use: No  . Drug use: No  . Sexual activity: Not on file  Lifestyle  . Physical activity:    Days per week: Not on file    Minutes per session: Not on file  . Stress: Not on file  Relationships  . Social connections:    Talks on phone: Not on file    Gets together: Not on file    Attends religious service: Not on file    Active member of club or organization: Not on file    Attends meetings of clubs or organizations: Not on file    Relationship status: Not on file  . Intimate partner violence:    Fear of current or ex partner: Not on file    Emotionally abused: Not on file    Physically abused: Not on file    Forced sexual activity: Not on file  Other Topics Concern  . Not on file  Social History Narrative  . Not on file    FAMILY HISTORY: Family History  Problem Relation Age of Onset  . CAD Neg Hx     ALLERGIES:  has No  Known Allergies.  MEDICATIONS:  Current Outpatient Medications  Medication Sig Dispense Refill  . amLODipine (NORVASC) 10 MG tablet Take 1 tablet (10 mg total) by mouth daily. 30 tablet 3  . atorvastatin (LIPITOR) 40 MG tablet Take 1 tablet (40 mg total) by mouth daily at 6 PM. 30 tablet 2  . calcitRIOL (ROCALTROL) 0.25 MCG capsule Take 0.25 mcg by mouth every Monday, Wednesday, and Friday.    . carvedilol (COREG) 12.5 MG tablet Take 1 tablet (12.5 mg total) by mouth 2 (two) times daily. (Patient taking differently: Take 6.25 mg by mouth daily. ) 60 tablet 1  . carvedilol (COREG) 6.25 MG tablet  Take 6.25 mg by mouth 2 (two) times daily.  3  . ELIQUIS STARTER PACK (ELIQUIS STARTER PACK) 5 MG TABS Take as directed on package: start with two-5mg  tablets twice daily for 7 days. On day 8, switch to one-5mg  tablet twice daily. 1 each 0  . Insulin Detemir (LEVEMIR) 100 UNIT/ML Pen Inject 8 Units into the skin daily at 10 pm. (Patient taking differently: Inject 8 Units into the skin daily as needed. ) 15 mL 2  . saxagliptin HCl (ONGLYZA) 5 MG TABS tablet Take 0.5 tablets (2.5 mg total) by mouth daily. For Diabetes (Patient taking differently: Take 5 mg by mouth daily. For Diabetes) 30 tablet 2  . acetaminophen (TYLENOL) 325 MG tablet Take 2 tablets (650 mg total) by mouth every 6 (six) hours as needed for mild pain (or Fever >/= 101). 60 tablet 1  . sodium bicarbonate 650 MG tablet Take 1 tablet (650 mg total) by mouth 3 (three) times daily. (Patient not taking: Reported on 07/06/2018) 90 tablet 1   No current facility-administered medications for this visit.     REVIEW OF SYSTEMS:   Constitutional: ( - ) fevers, ( - )  chills , ( - ) night sweats Ears, nose, mouth, throat, and face: ( - ) mucositis, ( - ) sore throat Respiratory: ( - ) cough, ( - ) dyspnea, ( - ) wheezes Cardiovascular: ( - ) palpitation, ( - ) chest discomfort, ( - ) lower extremity swelling Gastrointestinal:  ( - ) nausea, ( - )  heartburn, ( - ) change in bowel habits Skin: ( - ) abnormal skin rashes Lymphatics: ( - ) new lymphadenopathy Neurological: ( - ) numbness, ( - ) tingling, ( - ) new weaknesses Behavioral/Psych: ( - ) mood change, ( - ) new changes  All other systems were reviewed with the patient and are negative.  PHYSICAL EXAMINATION: ECOG PERFORMANCE STATUS: 1 - Symptomatic but completely ambulatory  Vitals:   08/24/18 1150  BP: (!) 148/67  Pulse: (!) 103  Resp: 20  Temp: 98.3 F (36.8 C)  SpO2: 96%   Filed Weights   08/24/18 1150  Weight: 166 lb (75.3 kg)    GENERAL: alert, no distress and comfortable SKIN: skin color, texture, turgor are normal, no rashes or significant lesions EYES: conjunctiva are pink and non-injected, sclera clear OROPHARYNX: no exudate, no erythema; lips, buccal mucosa, and tongue normal; chronic right-sided facial droop due to a birth defect NECK: supple, non-tender LYMPH:  no palpable lymphadenopathy in the cervical, axillary or inguinal LUNGS: clear to auscultation and percussion with normal breathing effort HEART: regular rate & rhythm, no murmur, 2+ lower extremity edema ABDOMEN: soft, non-tender, non-distended, normal bowel sounds Musculoskeletal: no cyanosis of digits and no clubbing  PSYCH: alert & oriented x 3, fluent speech NEURO: no focal motor/sensory deficits  LABORATORY DATA:  I have reviewed the data as listed Lab Results  Component Value Date   WBC 7.4 08/24/2018   HGB 8.7 (L) 08/24/2018   HCT 28.0 (L) 08/24/2018   MCV 75.1 (L) 08/24/2018   PLT 247 08/24/2018   Lab Results  Component Value Date   NA 144 08/24/2018   K 4.9 08/24/2018   CL 114 (H) 08/24/2018   CO2 20 (L) 08/24/2018

## 2018-08-24 NOTE — Assessment & Plan Note (Addendum)
Hgb 8.7 today, which is slightly lower than the recent labs done at her nephrologist's office.   Pt had received a phone call from her nephrologist's office this week regarding a return visit to discuss a starting date for ESA.   Given that the CKD-related anemia is being managed by the patient's nephrologist, we will defer the management at this time.   Iron profile was consistent with anemia chronic disease.  Will assess patient's response to ESA at the next visit and if she has inadequate response to ESA, we can consider checking soluble transferrin to rule out concurrent iron deficiency anemia.

## 2018-08-24 NOTE — Telephone Encounter (Signed)
   Certified Medical Technologist reports unable to reach [facility].    Today's CMP results provided to Triage at this time.  CreaC = 4.92 mg/dl.  On-call provider collaborative nurse notified.    No new orders.    Patient followed by [facility].  Need for dialysis has been discussed.

## 2018-08-24 NOTE — Assessment & Plan Note (Signed)
Patient reports having been evaluated by an ophthalmologist recently, who recommended "eye repair," but the surgery was put on hold due to being on anticoagulation.   I discussed with the patient the risks and benefits of holding anticoagulation for procedures. While there is a risk for recurrent VTE from withholding anticoagulation, the left eye blindness is causing significant impact on her quality of life. If there is a chance to improve her vision with her eye surgery, I think it is reasonable to hold anticoagulation for 3-5 days prior to the surgery, and may be resumed at the earliest time at the discretion of the ophthalmologist based on the risk of bleeding.   The patient is given information to provide to her ophthalmologist and we will be happy to discuss her case further prior to the surgery as needed.

## 2018-08-25 ENCOUNTER — Telehealth: Payer: Self-pay

## 2018-08-25 ENCOUNTER — Telehealth: Payer: Self-pay | Admitting: *Deleted

## 2018-08-25 LAB — IRON AND TIBC
Iron: 39 ug/dL — ABNORMAL LOW (ref 41–142)
Saturation Ratios: 16 % — ABNORMAL LOW (ref 21–57)
TIBC: 244 ug/dL (ref 236–444)
UIBC: 205 ug/dL

## 2018-08-25 LAB — FERRITIN: Ferritin: 212 ng/mL (ref 11–307)

## 2018-08-25 NOTE — Telephone Encounter (Signed)
Called Dr. Baird Cancer back patient not being seen by Dr. Burr Medico, seen at [facility] in [facility]

## 2018-08-25 NOTE — Telephone Encounter (Signed)
"  Melissa w/[facility] [phone]).  Received verbal clearance authorization to stop Eliquis.  Calling to request a release in writing because we may need to take her back if surgery needed to the eye."  Provided [place] number for this request.

## 2018-08-25 NOTE — Telephone Encounter (Signed)
Dr. Baird Cancer calls stating this patient needs eye surgery pretty quickly.  Wants to know the possibility of coming off Eliquis for the procedure.  If so how long can she be off?  #[phone]

## 2018-08-26 NOTE — Telephone Encounter (Signed)
Can we send my last note to the [place]? It has instruction on how to stop and resume anticoagulation for the eye surgery. Thanks!

## 2018-08-31 DIAGNOSIS — N185 Chronic kidney disease, stage 5: Secondary | ICD-10-CM | POA: Diagnosis not present

## 2018-08-31 DIAGNOSIS — I129 Hypertensive chronic kidney disease with stage 1 through stage 4 chronic kidney disease, or unspecified chronic kidney disease: Secondary | ICD-10-CM | POA: Diagnosis not present

## 2018-09-07 DIAGNOSIS — Z7901 Long term (current) use of anticoagulants: Secondary | ICD-10-CM | POA: Insufficient documentation

## 2018-09-07 NOTE — Assessment & Plan Note (Signed)
The patient's nephrologist is planning to place vascular access in anticipation of dialysis.  I discussed w/ the pt's nephrologist the potential risks of recurrent VTE's.  Given that the patient has completed almost three months of therapeutic anticoagulation, I recommended that Eliquis can be held 3 days prior to high-bleeding risk procedure (such as AV graft) and may be resumed 3 days after the procedure (assuming achieving adequate hemostasis).

## 2018-09-09 DIAGNOSIS — M25562 Pain in left knee: Secondary | ICD-10-CM | POA: Diagnosis not present

## 2018-09-15 DIAGNOSIS — E113533 Type 2 diabetes mellitus with proliferative diabetic retinopathy with traction retinal detachment not involving the macula, bilateral: Secondary | ICD-10-CM | POA: Diagnosis not present

## 2018-09-15 DIAGNOSIS — H4312 Vitreous hemorrhage, left eye: Secondary | ICD-10-CM | POA: Diagnosis not present

## 2018-09-15 DIAGNOSIS — H2513 Age-related nuclear cataract, bilateral: Secondary | ICD-10-CM | POA: Diagnosis not present

## 2018-09-16 DIAGNOSIS — H4312 Vitreous hemorrhage, left eye: Secondary | ICD-10-CM | POA: Diagnosis not present

## 2018-09-16 DIAGNOSIS — E113592 Type 2 diabetes mellitus with proliferative diabetic retinopathy without macular edema, left eye: Secondary | ICD-10-CM | POA: Diagnosis not present

## 2018-09-16 DIAGNOSIS — H35372 Puckering of macula, left eye: Secondary | ICD-10-CM | POA: Diagnosis not present

## 2018-09-20 ENCOUNTER — Other Ambulatory Visit: Payer: Self-pay

## 2018-09-20 DIAGNOSIS — N185 Chronic kidney disease, stage 5: Secondary | ICD-10-CM

## 2018-09-24 ENCOUNTER — Other Ambulatory Visit (HOSPITAL_COMMUNITY): Payer: Self-pay

## 2018-09-24 DIAGNOSIS — H4312 Vitreous hemorrhage, left eye: Secondary | ICD-10-CM | POA: Diagnosis not present

## 2018-09-24 NOTE — Discharge Instructions (Signed)
Darbepoetin Alfa injection °What is this medicine? °DARBEPOETIN ALFA (dar be POE e tin AL fa) helps your body make more red blood cells. It is used to treat anemia caused by chronic kidney failure and chemotherapy. °This medicine may be used for other purposes; ask your health care provider or pharmacist if you have questions. °COMMON BRAND NAME(S): Aranesp °What should I tell my health care provider before I take this medicine? °They need to know if you have any of these conditions: °-blood clotting disorders or history of blood clots °-cancer patient not on chemotherapy °-cystic fibrosis °-heart disease, such as angina, heart failure, or a history of a heart attack °-hemoglobin level of 12 g/dL or greater °-high blood pressure °-low levels of folate, iron, or vitamin B12 °-seizures °-an unusual or allergic reaction to darbepoetin, erythropoietin, albumin, hamster proteins, latex, other medicines, foods, dyes, or preservatives °-pregnant or trying to get pregnant °-breast-feeding °How should I use this medicine? °This medicine is for injection into a vein or under the skin. It is usually given by a health care professional in a hospital or clinic setting. °If you get this medicine at home, you will be taught how to prepare and give this medicine. Use exactly as directed. Take your medicine at regular intervals. Do not take your medicine more often than directed. °It is important that you put your used needles and syringes in a special sharps container. Do not put them in a trash can. If you do not have a sharps container, call your pharmacist or healthcare provider to get one. °A special MedGuide will be given to you by the pharmacist with each prescription and refill. Be sure to read this information carefully each time. °Talk to your pediatrician regarding the use of this medicine in children. While this medicine may be used in children as young as 1 year for selected conditions, precautions do  apply. °Overdosage: If you think you have taken too much of this medicine contact a poison control center or emergency room at once. °NOTE: This medicine is only for you. Do not share this medicine with others. °What if I miss a dose? °If you miss a dose, take it as soon as you can. If it is almost time for your next dose, take only that dose. Do not take double or extra doses. °What may interact with this medicine? °Do not take this medicine with any of the following medications: °-epoetin alfa °This list may not describe all possible interactions. Give your health care provider a list of all the medicines, herbs, non-prescription drugs, or dietary supplements you use. Also tell them if you smoke, drink alcohol, or use illegal drugs. Some items may interact with your medicine. °What should I watch for while using this medicine? °Your condition will be monitored carefully while you are receiving this medicine. °You may need blood work done while you are taking this medicine. °What side effects may I notice from receiving this medicine? °Side effects that you should report to your doctor or health care professional as soon as possible: °-allergic reactions like skin rash, itching or hives, swelling of the face, lips, or tongue °-breathing problems °-changes in vision °-chest pain °-confusion, trouble speaking or understanding °-feeling faint or lightheaded, falls °-high blood pressure °-muscle aches or pains °-pain, swelling, warmth in the leg °-rapid weight gain °-severe headaches °-sudden numbness or weakness of the face, arm or leg °-trouble walking, dizziness, loss of balance or coordination °-seizures (convulsions) °-swelling of the ankles, feet, hands °-  unusually weak or tired °Side effects that usually do not require medical attention (report to your doctor or health care professional if they continue or are bothersome): °-diarrhea °-fever, chills (flu-like symptoms) °-headaches °-nausea, vomiting °-redness,  stinging, or swelling at site where injected °This list may not describe all possible side effects. Call your doctor for medical advice about side effects. You may report side effects to FDA at 1-800-FDA-1088. °Where should I keep my medicine? °Keep out of the reach of children. °Store in a refrigerator between 2 and 8 degrees C (36 and 46 degrees F). Do not freeze. Do not shake. Throw away any unused portion if using a single-dose vial. Throw away any unused medicine after the expiration date. °NOTE: This sheet is a summary. It may not cover all possible information. If you have questions about this medicine, talk to your doctor, pharmacist, or health care provider. °© 2018 Elsevier/Gold Standard (2016-07-14 19:52:26) °Ferumoxytol injection °What is this medicine? °FERUMOXYTOL is an iron complex. Iron is used to make healthy red blood cells, which carry oxygen and nutrients throughout the body. This medicine is used to treat iron deficiency anemia in people with chronic kidney disease. °This medicine may be used for other purposes; ask your health care provider or pharmacist if you have questions. °COMMON BRAND NAME(S): Feraheme °What should I tell my health care provider before I take this medicine? °They need to know if you have any of these conditions: °-anemia not caused by low iron levels °-high levels of iron in the blood °-magnetic resonance imaging (MRI) test scheduled °-an unusual or allergic reaction to iron, other medicines, foods, dyes, or preservatives °-pregnant or trying to get pregnant °-breast-feeding °How should I use this medicine? °This medicine is for injection into a vein. It is given by a health care professional in a hospital or clinic setting. °Talk to your pediatrician regarding the use of this medicine in children. Special care may be needed. °Overdosage: If you think you have taken too much of this medicine contact a poison control center or emergency room at once. °NOTE: This medicine  is only for you. Do not share this medicine with others. °What if I miss a dose? °It is important not to miss your dose. Call your doctor or health care professional if you are unable to keep an appointment. °What may interact with this medicine? °This medicine may interact with the following medications: °-other iron products °This list may not describe all possible interactions. Give your health care provider a list of all the medicines, herbs, non-prescription drugs, or dietary supplements you use. Also tell them if you smoke, drink alcohol, or use illegal drugs. Some items may interact with your medicine. °What should I watch for while using this medicine? °Visit your doctor or healthcare professional regularly. Tell your doctor or healthcare professional if your symptoms do not start to get better or if they get worse. You may need blood work done while you are taking this medicine. °You may need to follow a special diet. Talk to your doctor. Foods that contain iron include: whole grains/cereals, dried fruits, beans, or peas, leafy green vegetables, and organ meats (liver, kidney). °What side effects may I notice from receiving this medicine? °Side effects that you should report to your doctor or health care professional as soon as possible: °-allergic reactions like skin rash, itching or hives, swelling of the face, lips, or tongue °-breathing problems °-changes in blood pressure °-feeling faint or lightheaded, falls °-fever or chills °-flushing,   sweating, or hot feelings °-swelling of the ankles or feet °Side effects that usually do not require medical attention (report to your doctor or health care professional if they continue or are bothersome): °-diarrhea °-headache °-nausea, vomiting °-stomach pain °This list may not describe all possible side effects. Call your doctor for medical advice about side effects. You may report side effects to FDA at 1-800-FDA-1088. °Where should I keep my medicine? °This drug  is given in a hospital or clinic and will not be stored at home. °NOTE: This sheet is a summary. It may not cover all possible information. If you have questions about this medicine, talk to your doctor, pharmacist, or health care provider. °© 2018 Elsevier/Gold Standard (2015-12-27 12:41:49) ° °

## 2018-09-27 ENCOUNTER — Ambulatory Visit (HOSPITAL_COMMUNITY)
Admission: RE | Admit: 2018-09-27 | Discharge: 2018-09-27 | Disposition: A | Discharge status: Home / Self Care | Payer: Federal, State, Local not specified - PPO | Source: Ambulatory Visit | Attending: Nephrology | Admitting: Nephrology

## 2018-09-27 VITALS — BP 143/64 | HR 92 | Temp 98.6°F | Resp 20 | Ht 64.0 in | Wt 155.0 lb

## 2018-09-27 DIAGNOSIS — N184 Chronic kidney disease, stage 4 (severe): Secondary | ICD-10-CM

## 2018-09-27 DIAGNOSIS — N185 Chronic kidney disease, stage 5: Secondary | ICD-10-CM | POA: Insufficient documentation

## 2018-09-27 DIAGNOSIS — D631 Anemia in chronic kidney disease: Secondary | ICD-10-CM | POA: Diagnosis not present

## 2018-09-27 LAB — POCT HEMOGLOBIN-HEMACUE: Hemoglobin: 8.6 g/dL — ABNORMAL LOW (ref 12.0–15.0)

## 2018-09-27 MED ORDER — DARBEPOETIN ALFA 60 MCG/0.3ML IJ SOSY
PREFILLED_SYRINGE | INTRAMUSCULAR | Status: AC
  Administered 2018-09-27: 60 ug
  Filled 2018-09-27: qty 0.3

## 2018-09-27 MED ORDER — SODIUM CHLORIDE 0.9 % IV SOLN
510.0000 mg | INTRAVENOUS | Status: DC
  Administered 2018-09-27: 510 mg via INTRAVENOUS
  Filled 2018-09-27: qty 17

## 2018-09-27 MED ORDER — DARBEPOETIN ALFA 60 MCG/0.3ML IJ SOSY: 60.0000 ug | PREFILLED_SYRINGE | INTRAMUSCULAR | Status: DC

## 2018-09-30 DIAGNOSIS — H4312 Vitreous hemorrhage, left eye: Secondary | ICD-10-CM | POA: Diagnosis not present

## 2018-09-30 DIAGNOSIS — E113592 Type 2 diabetes mellitus with proliferative diabetic retinopathy without macular edema, left eye: Secondary | ICD-10-CM | POA: Diagnosis not present

## 2018-10-04 ENCOUNTER — Encounter (HOSPITAL_COMMUNITY): Payer: Federal, State, Local not specified - PPO

## 2018-10-04 ENCOUNTER — Ambulatory Visit (HOSPITAL_COMMUNITY)
Admission: RE | Admit: 2018-10-04 | Discharge: 2018-10-04 | Disposition: A | Discharge status: Home / Self Care | Payer: Federal, State, Local not specified - PPO | Source: Ambulatory Visit | Attending: Nephrology | Admitting: Nephrology

## 2018-10-04 DIAGNOSIS — N185 Chronic kidney disease, stage 5: Secondary | ICD-10-CM | POA: Diagnosis not present

## 2018-10-04 DIAGNOSIS — D631 Anemia in chronic kidney disease: Secondary | ICD-10-CM | POA: Diagnosis not present

## 2018-10-04 MED ORDER — SODIUM CHLORIDE 0.9 % IV SOLN
510.0000 mg | INTRAVENOUS | Status: AC
  Administered 2018-10-04: 510 mg via INTRAVENOUS
  Filled 2018-10-04: qty 17

## 2018-10-05 DIAGNOSIS — N185 Chronic kidney disease, stage 5: Secondary | ICD-10-CM | POA: Diagnosis not present

## 2018-10-05 DIAGNOSIS — I120 Hypertensive chronic kidney disease with stage 5 chronic kidney disease or end stage renal disease: Secondary | ICD-10-CM | POA: Diagnosis not present

## 2018-10-05 DIAGNOSIS — D631 Anemia in chronic kidney disease: Secondary | ICD-10-CM | POA: Diagnosis not present

## 2018-10-05 DIAGNOSIS — N2581 Secondary hyperparathyroidism of renal origin: Secondary | ICD-10-CM | POA: Diagnosis not present

## 2018-10-08 DIAGNOSIS — E113533 Type 2 diabetes mellitus with proliferative diabetic retinopathy with traction retinal detachment not involving the macula, bilateral: Secondary | ICD-10-CM | POA: Diagnosis not present

## 2018-10-11 ENCOUNTER — Ambulatory Visit (HOSPITAL_COMMUNITY)
Admission: RE | Admit: 2018-10-11 | Discharge: 2018-10-11 | Disposition: A | Discharge status: Home / Self Care | Payer: Federal, State, Local not specified - PPO | Source: Ambulatory Visit | Attending: Nephrology | Admitting: Nephrology

## 2018-10-11 VITALS — BP 172/85 | HR 101 | Resp 16

## 2018-10-11 DIAGNOSIS — D631 Anemia in chronic kidney disease: Secondary | ICD-10-CM | POA: Diagnosis not present

## 2018-10-11 DIAGNOSIS — N184 Chronic kidney disease, stage 4 (severe): Secondary | ICD-10-CM

## 2018-10-11 DIAGNOSIS — N185 Chronic kidney disease, stage 5: Secondary | ICD-10-CM | POA: Insufficient documentation

## 2018-10-11 LAB — POCT HEMOGLOBIN-HEMACUE: Hemoglobin: 10 g/dL — ABNORMAL LOW (ref 12.0–15.0)

## 2018-10-11 MED ORDER — DARBEPOETIN ALFA 60 MCG/0.3ML IJ SOSY
PREFILLED_SYRINGE | INTRAMUSCULAR | Status: AC
  Administered 2018-10-11: 60 ug
  Filled 2018-10-11: qty 0.3

## 2018-10-11 MED ORDER — DARBEPOETIN ALFA 60 MCG/0.3ML IJ SOSY: 60.0000 ug | PREFILLED_SYRINGE | INTRAMUSCULAR | Status: DC

## 2018-10-20 ENCOUNTER — Telehealth: Payer: Self-pay | Admitting: Hematology & Oncology

## 2018-10-20 ENCOUNTER — Telehealth: Payer: Self-pay | Admitting: Hematology

## 2018-10-20 NOTE — Telephone Encounter (Signed)
Appointments were r/s from 12/17 and I spoke with patient regarding new date/time per 11/13 email from [employer]

## 2018-10-21 DIAGNOSIS — M25562 Pain in left knee: Secondary | ICD-10-CM | POA: Diagnosis not present

## 2018-10-25 ENCOUNTER — Ambulatory Visit (HOSPITAL_COMMUNITY)
Admission: RE | Admit: 2018-10-25 | Discharge: 2018-10-25 | Disposition: A | Discharge status: Home / Self Care | Payer: Federal, State, Local not specified - PPO | Source: Ambulatory Visit | Attending: Nephrology | Admitting: Nephrology

## 2018-10-25 VITALS — BP 165/64 | HR 96 | Temp 98.3°F | Resp 20

## 2018-10-25 DIAGNOSIS — N184 Chronic kidney disease, stage 4 (severe): Secondary | ICD-10-CM | POA: Diagnosis not present

## 2018-10-25 LAB — FERRITIN: Ferritin: 471 ng/mL — ABNORMAL HIGH (ref 11–307)

## 2018-10-25 LAB — IRON AND TIBC
Iron: 78 ug/dL (ref 28–170)
Saturation Ratios: 34 % — ABNORMAL HIGH (ref 10.4–31.8)
TIBC: 232 ug/dL — ABNORMAL LOW (ref 250–450)
UIBC: 154 ug/dL

## 2018-10-25 MED ORDER — DARBEPOETIN ALFA 60 MCG/0.3ML IJ SOSY
60.0000 ug | PREFILLED_SYRINGE | INTRAMUSCULAR | Status: DC
  Administered 2018-10-25: 60 ug via SUBCUTANEOUS

## 2018-10-25 MED ORDER — DARBEPOETIN ALFA 60 MCG/0.3ML IJ SOSY
PREFILLED_SYRINGE | INTRAMUSCULAR | Status: AC
  Filled 2018-10-25: qty 0.3

## 2018-10-26 DIAGNOSIS — E113591 Type 2 diabetes mellitus with proliferative diabetic retinopathy without macular edema, right eye: Secondary | ICD-10-CM | POA: Diagnosis not present

## 2018-10-26 DIAGNOSIS — E113532 Type 2 diabetes mellitus with proliferative diabetic retinopathy with traction retinal detachment not involving the macula, left eye: Secondary | ICD-10-CM | POA: Diagnosis not present

## 2018-10-26 LAB — POCT HEMOGLOBIN-HEMACUE: Hemoglobin: 10.7 g/dL — ABNORMAL LOW (ref 12.0–15.0)

## 2018-10-29 ENCOUNTER — Encounter

## 2018-10-29 ENCOUNTER — Encounter: Payer: Federal, State, Local not specified - PPO | Admitting: Vascular Surgery

## 2018-10-29 ENCOUNTER — Encounter (HOSPITAL_COMMUNITY): Payer: Federal, State, Local not specified - PPO

## 2018-10-29 ENCOUNTER — Other Ambulatory Visit (HOSPITAL_COMMUNITY): Payer: Federal, State, Local not specified - PPO

## 2018-11-01 ENCOUNTER — Encounter: Payer: Self-pay | Admitting: Vascular Surgery

## 2018-11-08 ENCOUNTER — Ambulatory Visit (HOSPITAL_COMMUNITY)
Admission: RE | Admit: 2018-11-08 | Discharge: 2018-11-08 | Disposition: A | Discharge status: Home / Self Care | Payer: Federal, State, Local not specified - PPO | Source: Ambulatory Visit | Attending: Nephrology | Admitting: Nephrology

## 2018-11-08 VITALS — BP 180/86 | HR 110 | Temp 98.0°F | Resp 20

## 2018-11-08 DIAGNOSIS — N184 Chronic kidney disease, stage 4 (severe): Secondary | ICD-10-CM

## 2018-11-08 DIAGNOSIS — E113542 Type 2 diabetes mellitus with proliferative diabetic retinopathy with combined traction retinal detachment and rhegmatogenous retinal detachment, left eye: Secondary | ICD-10-CM | POA: Diagnosis not present

## 2018-11-08 DIAGNOSIS — D631 Anemia in chronic kidney disease: Secondary | ICD-10-CM | POA: Insufficient documentation

## 2018-11-08 DIAGNOSIS — E113591 Type 2 diabetes mellitus with proliferative diabetic retinopathy without macular edema, right eye: Secondary | ICD-10-CM | POA: Diagnosis not present

## 2018-11-08 LAB — POCT HEMOGLOBIN-HEMACUE: Hemoglobin: 10.7 g/dL — ABNORMAL LOW (ref 12.0–15.0)

## 2018-11-08 MED ORDER — DARBEPOETIN ALFA 60 MCG/0.3ML IJ SOSY
60.0000 ug | PREFILLED_SYRINGE | INTRAMUSCULAR | Status: DC
  Administered 2018-11-08: 60 ug via SUBCUTANEOUS

## 2018-11-08 MED ORDER — DARBEPOETIN ALFA 60 MCG/0.3ML IJ SOSY
PREFILLED_SYRINGE | INTRAMUSCULAR | Status: AC
  Administered 2018-11-08: 60 ug via SUBCUTANEOUS
  Filled 2018-11-08: qty 0.3

## 2018-11-09 DIAGNOSIS — E113542 Type 2 diabetes mellitus with proliferative diabetic retinopathy with combined traction retinal detachment and rhegmatogenous retinal detachment, left eye: Secondary | ICD-10-CM | POA: Diagnosis not present

## 2018-11-11 DIAGNOSIS — H35372 Puckering of macula, left eye: Secondary | ICD-10-CM | POA: Diagnosis not present

## 2018-11-11 DIAGNOSIS — H33022 Retinal detachment with multiple breaks, left eye: Secondary | ICD-10-CM | POA: Diagnosis not present

## 2018-11-12 DIAGNOSIS — E113532 Type 2 diabetes mellitus with proliferative diabetic retinopathy with traction retinal detachment not involving the macula, left eye: Secondary | ICD-10-CM | POA: Diagnosis not present

## 2018-11-22 ENCOUNTER — Ambulatory Visit (HOSPITAL_COMMUNITY)
Admission: RE | Admit: 2018-11-22 | Discharge: 2018-11-22 | Disposition: A | Discharge status: Home / Self Care | Payer: Federal, State, Local not specified - PPO | Source: Ambulatory Visit | Attending: Nephrology | Admitting: Nephrology

## 2018-11-22 VITALS — BP 182/89 | HR 109 | Temp 97.9°F | Resp 20

## 2018-11-22 DIAGNOSIS — N184 Chronic kidney disease, stage 4 (severe): Secondary | ICD-10-CM | POA: Diagnosis not present

## 2018-11-22 LAB — POCT HEMOGLOBIN-HEMACUE: Hemoglobin: 10.9 g/dL — ABNORMAL LOW (ref 12.0–15.0)

## 2018-11-22 LAB — FERRITIN: Ferritin: 488 ng/mL — ABNORMAL HIGH (ref 11–307)

## 2018-11-22 LAB — IRON AND TIBC
Iron: 54 ug/dL (ref 28–170)
Saturation Ratios: 29 % (ref 10.4–31.8)
TIBC: 189 ug/dL — ABNORMAL LOW (ref 250–450)
UIBC: 135 ug/dL

## 2018-11-22 MED ORDER — DARBEPOETIN ALFA 60 MCG/0.3ML IJ SOSY
60.0000 ug | PREFILLED_SYRINGE | INTRAMUSCULAR | Status: DC
  Administered 2018-11-22: 60 ug via SUBCUTANEOUS

## 2018-11-22 MED ORDER — DARBEPOETIN ALFA 60 MCG/0.3ML IJ SOSY
PREFILLED_SYRINGE | INTRAMUSCULAR | Status: AC
  Filled 2018-11-22: qty 0.3

## 2018-11-23 ENCOUNTER — Other Ambulatory Visit: Payer: Federal, State, Local not specified - PPO

## 2018-11-23 ENCOUNTER — Ambulatory Visit: Payer: Federal, State, Local not specified - PPO | Admitting: Hematology

## 2018-11-25 ENCOUNTER — Other Ambulatory Visit: Payer: Self-pay | Admitting: Hematology

## 2018-11-25 ENCOUNTER — Ambulatory Visit: Payer: Federal, State, Local not specified - PPO | Admitting: Hematology

## 2018-11-25 ENCOUNTER — Other Ambulatory Visit: Payer: Federal, State, Local not specified - PPO

## 2018-11-25 DIAGNOSIS — N184 Chronic kidney disease, stage 4 (severe): Principal | ICD-10-CM

## 2018-11-25 DIAGNOSIS — I82402 Acute embolism and thrombosis of unspecified deep veins of left lower extremity: Secondary | ICD-10-CM

## 2018-11-25 DIAGNOSIS — D631 Anemia in chronic kidney disease: Secondary | ICD-10-CM

## 2018-12-03 ENCOUNTER — Encounter (HOSPITAL_COMMUNITY): Payer: Federal, State, Local not specified - PPO

## 2018-12-06 ENCOUNTER — Encounter (HOSPITAL_COMMUNITY): Payer: Self-pay

## 2018-12-06 ENCOUNTER — Ambulatory Visit (HOSPITAL_COMMUNITY)
Admission: RE | Admit: 2018-12-06 | Discharge: 2018-12-06 | Disposition: A | Discharge status: Home / Self Care | Payer: Federal, State, Local not specified - PPO | Source: Ambulatory Visit | Attending: Nephrology | Admitting: Nephrology

## 2018-12-06 ENCOUNTER — Emergency Department (HOSPITAL_COMMUNITY)
Admission: EM | Admit: 2018-12-06 | Discharge: 2018-12-06 | Disposition: A | Discharge status: Home / Self Care | Payer: Federal, State, Local not specified - PPO | Attending: Emergency Medicine | Admitting: Emergency Medicine

## 2018-12-06 ENCOUNTER — Other Ambulatory Visit: Payer: Self-pay

## 2018-12-06 VITALS — BP 181/83 | HR 108 | Temp 98.6°F | Resp 18

## 2018-12-06 DIAGNOSIS — I10 Essential (primary) hypertension: Secondary | ICD-10-CM | POA: Diagnosis not present

## 2018-12-06 DIAGNOSIS — I129 Hypertensive chronic kidney disease with stage 1 through stage 4 chronic kidney disease, or unspecified chronic kidney disease: Secondary | ICD-10-CM | POA: Insufficient documentation

## 2018-12-06 DIAGNOSIS — N184 Chronic kidney disease, stage 4 (severe): Secondary | ICD-10-CM

## 2018-12-06 DIAGNOSIS — E1122 Type 2 diabetes mellitus with diabetic chronic kidney disease: Secondary | ICD-10-CM | POA: Diagnosis not present

## 2018-12-06 DIAGNOSIS — Z9114 Patient's other noncompliance with medication regimen: Secondary | ICD-10-CM | POA: Diagnosis not present

## 2018-12-06 DIAGNOSIS — R000 Tachycardia, unspecified: Secondary | ICD-10-CM | POA: Diagnosis not present

## 2018-12-06 DIAGNOSIS — Z79899 Other long term (current) drug therapy: Secondary | ICD-10-CM | POA: Insufficient documentation

## 2018-12-06 DIAGNOSIS — Z794 Long term (current) use of insulin: Secondary | ICD-10-CM | POA: Insufficient documentation

## 2018-12-06 LAB — CBG MONITORING, ED: Glucose-Capillary: 167 mg/dL — ABNORMAL HIGH (ref 70–99)

## 2018-12-06 LAB — POCT HEMOGLOBIN-HEMACUE: Hemoglobin: 10.8 g/dL — ABNORMAL LOW (ref 12.0–15.0)

## 2018-12-06 MED ORDER — CLONIDINE HCL 0.1 MG PO TABS
0.1000 mg | ORAL_TABLET | Freq: Once | ORAL | Status: AC | PRN
  Administered 2018-12-06: 0.1 mg via ORAL

## 2018-12-06 MED ORDER — CLONIDINE HCL 0.1 MG PO TABS
ORAL_TABLET | ORAL | Status: AC
  Administered 2018-12-06: 0.1 mg via ORAL
  Filled 2018-12-06: qty 1

## 2018-12-06 MED ORDER — DARBEPOETIN ALFA 60 MCG/0.3ML IJ SOSY: 60.0000 ug | PREFILLED_SYRINGE | INTRAMUSCULAR | Status: DC

## 2018-12-06 MED ORDER — CARVEDILOL 12.5 MG PO TABS
12.5000 mg | ORAL_TABLET | Freq: Once | ORAL | Status: AC
  Administered 2018-12-06: 12.5 mg via ORAL
  Filled 2018-12-06: qty 1

## 2018-12-06 MED ORDER — CARVEDILOL 12.5 MG PO TABS: 12.5000 mg | ORAL_TABLET | Freq: Two times a day (BID) | ORAL | 0 refills | Status: DC

## 2018-12-06 MED ORDER — AMLODIPINE BESYLATE 10 MG PO TABS: 10.0000 mg | ORAL_TABLET | Freq: Every day | ORAL | 0 refills | Status: DC

## 2018-12-06 NOTE — ED Notes (Signed)
Patient verbalizes understanding of discharge instructions. Opportunity for questioning and answers were provided. Armband removed by staff, pt discharged from ED ambulatory to home.  

## 2018-12-06 NOTE — ED Triage Notes (Signed)
Pt was sent here from the medical daycare upstairs for high blood pressure, pt states she has not been taking it. Pt does not know how long she has not been taking her medication and when asked why she states "I just haven't taken it". Pt denies and pain, dizziness or vision changes. Pt a.o, ambulatory.

## 2018-12-06 NOTE — ED Provider Notes (Signed)
[facility]ENC[facility] DEPART[facility] Provider Note   CSN: [id number] Arrival date & time: 12/06/18  1312     History   Chief Complaint Chief Complaint  Patient presents with  . Hypertension    HPI Colleen [name] is a 57 y.o. female past medical history of chronic kidney disease, diabetes, DVT, hyperlipidemia who presents for evaluation of hypertension.  Patient was upstairs at the ambulatory day center being an iron infusion when they noticed she was hypertensive.  Patient was sent to the ED for further evaluation.  Patient has a history of hypertension and is supposed to be taking amlodipine and carvedilol.  Patient reports she has not taken her medications for about a month.  Patient states that "I just did not feel like taking them did not really know what to take."  Patient states that she is not having any chest pain, dizziness, shortness of breath, vision changes, numbness/weakness of arms or legs, abdominal pain, nausea/vomiting.  Patient states that she had previously been seeing her doctor at [place] but states that he recently retired and so she has not been back in several months.  Patient also reports a history of a DVT in July 2019 for which she was on Eliquis for.  Patient reports that she was taken off the Eliquis in September when she needed eye surgery.  She has not previously been on any blood thinners.  She denies any redness or swelling of her legs.  The history is provided by the patient.    Past Medical History:  Diagnosis Date  . Chronic kidney disease   . Diabetes ([place])   . DVT (deep venous thrombosis) ([place])   . E-coli UTI   . Gout 06/05/2018  . HLD (hyperlipidemia) 06/05/2018  . PE (pulmonary embolism)   . 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 Long term (current) use of anticoagulants 09/07/2018  . DVT (deep venous thrombosis)  ([place]) 08/24/2018  . Blindness of left eye 08/24/2018  . Acute kidney injury superimposed on CKD ([place]) 06/06/2018  . Left knee pain 06/06/2018  . Anemia due to chronic kidney disease 06/06/2018  . Hyponatremia 06/06/2018  . HLD (hyperlipidemia) 06/05/2018  . Hyperglycemia 06/05/2018  . Gout 06/05/2018  . Swelling of lower extremity 05/28/2018  . CKD (chronic kidney disease) stage 4, GFR 15-29 ml/min ([facility]) 05/28/2018  . Hypochromic microcytic anemia 05/28/2018  . Lower extremity edema 05/28/2018  . HTN (hypertension) 10/06/2013  . Pulmonary embolism ([place]) 10/06/2013  . Congestive dilated cardiomyopathy ([place]) 10/05/2013  . Carotid bruit 10/05/2013  . DM (diabetes mellitus), type 2, uncontrolled ([place]) 10/04/2013  . Acute renal insufficiency 09/27/2013  . Septic shock(785.52) 09/25/2013  . Bacteremia 09/25/2013    Past Surgical History:  Procedure Laterality Date  . NO PAST SURGERIES       OB History   No obstetric history on file.      Home Medications    Prior to Admission medications   Medication Sig Start Date End Date Taking? Authorizing Provider  acetaminophen (TYLENOL) 325 MG tablet Take 2 tablets (650 mg total) by mouth every 6 (six) hours as needed for mild pain (or Fever >/= 101). 05/30/18   Emokpae, Courage, MD  amLODipine (NORVASC) 10 MG tablet Take 1 tablet (10 mg total) by mouth daily for 21 days. 12/06/18 12/27/18  Volanda Napoleon,  PA-C  atorvastatin (LIPITOR) 40 MG tablet Take 1 tablet (40 mg total) by mouth daily at 6 PM. 05/30/18   Roxan Hockey, MD  calcitRIOL (ROCALTROL) 0.25 MCG capsule Take 0.25 mcg by mouth every Monday, Wednesday, and Friday.    [provider]  carvedilol (COREG) 12.5 MG tablet Take 1 tablet (12.5 mg total) by mouth 2 (two) times daily with a meal for 21 days. 12/06/18 12/27/18  Volanda Napoleon, PA-C  ELIQUIS STARTER PACK (ELIQUIS STARTER PACK) 5 MG TABS Take as directed on package: start with two-5mg  tablets twice daily for  7 days. On day 8, switch to one-5mg  tablet twice daily. 07/06/18   Drenda Freeze, MD  Insulin Detemir (LEVEMIR) 100 UNIT/ML Pen Inject 8 Units into the skin daily at 10 pm. Patient taking differently: Inject 8 Units into the skin daily as needed.  05/30/18   Roxan Hockey, MD  saxagliptin HCl (ONGLYZA) 5 MG TABS tablet Take 0.5 tablets (2.5 mg total) by mouth daily. For Diabetes Patient taking differently: Take 5 mg by mouth daily. For Diabetes 06/07/18   Oretha Milch D, MD  sodium bicarbonate 650 MG tablet Take 1 tablet (650 mg total) by mouth 3 (three) times daily. Patient not taking: Reported on 07/06/2018 06/07/18   Desiree Hane, MD    Family History Family History  Problem Relation Age of Onset  . CAD Neg Hx     Social History Social History   Tobacco Use  . Smoking status: Never Smoker  . Smokeless tobacco: Never Used  Substance Use Topics  . Alcohol use: No  . Drug use: No     Allergies   Patient has no known allergies.   Review of Systems Review of Systems  Eyes: Negative for visual disturbance.  Respiratory: Negative for cough and shortness of breath.   Cardiovascular: Negative for chest pain and leg swelling.  Gastrointestinal: Negative for abdominal pain, nausea and vomiting.  Genitourinary: Negative for dysuria and hematuria.  Neurological: Negative for headaches.  All other systems reviewed and are negative.    Physical Exam Updated Vital Signs BP (!) 173/102   Pulse 93   Temp 97.7 F (36.5 C) (Oral)   Resp 13   LMP 12/08/2013   SpO2 98%   Physical Exam Vitals signs and nursing note reviewed.  Constitutional:      Appearance: Normal appearance. She is well-developed.  HENT:     Head: Normocephalic and atraumatic.  Eyes:     General: Lids are normal.     Extraocular Movements: Extraocular movements intact.     Conjunctiva/sclera: Conjunctivae normal.     Pupils: Pupils are equal, round, and reactive to light.  Neck:      Musculoskeletal: Full passive range of motion without pain.  Cardiovascular:     Rate and Rhythm: Regular rhythm. Tachycardia present.     Pulses: Normal pulses.          Radial pulses are 2+ on the right side and 2+ on the left side.       Dorsalis pedis pulses are 2+ on the right side and 2+ on the left side.     Heart sounds: Normal heart sounds. No murmur. No friction rub. No gallop.   Pulmonary:     Effort: Pulmonary effort is normal.     Breath sounds: Normal breath sounds.     Comments: Lungs clear to auscultation bilaterally.  Symmetric chest rise.  No wheezing, rales, rhonchi. Abdominal:  Palpations: Abdomen is soft. Abdomen is not rigid.     Tenderness: There is no abdominal tenderness. There is no guarding.  Musculoskeletal: Normal range of motion.     Comments: Bilateral lower extremities are symmetric in appearance without any overlying warmth or edema.  Skin:    General: Skin is warm and dry.     Capillary Refill: Capillary refill takes less than 2 seconds.  Neurological:     Mental Status: She is alert and oriented to person, place, and time.     Comments: Right-sided facial droop present which patient states is her baseline and is not new.  Cranial nerves otherwise intact. Follows commands, Moves all extremities  5/5 strength to BUE and BLE  Sensation intact throughout all major nerve distributions Normal finger to nose. No dysdiadochokinesia. No pronator drift. No gait abnormalities  No slurred speech.   Psychiatric:        Speech: Speech normal.      ED Treatments / Results  Labs (all labs ordered are listed, but only abnormal results are displayed) Labs Reviewed  CBG MONITORING, ED - Abnormal; Notable for the following components:      Result Value   Glucose-Capillary 167 (*)    All other components within normal limits    EKG EKG Interpretation  Date/Time:  Monday December 06 2018 14:41:47 EST Ventricular Rate:  94 PR Interval:    QRS  Duration: 53 QT Interval:  424 QTC Calculation: 531 R Axis:   96 Text Interpretation:  Sinus rhythm Borderline right axis deviation Anteroseptal infarct, old Prolonged QT interval When compared to prior, slightly longer QTc.  No STEMI Confirmed by Antony Blackbird [phone]) on 12/06/2018 3:36:40 PM   Radiology No results found.  Procedures Procedures (including critical care time)  Medications Ordered in ED Medications  carvedilol (COREG) tablet 12.5 mg (12.5 mg Oral Given 12/06/18 1442)     Initial Impression / Assessment and Plan / ED Course  I have reviewed the triage vital signs and the nursing notes.  Pertinent labs & imaging results that were available during my care of the patient were reviewed by me and considered in my medical decision making (see chart for details).     57 year old female who presents for evaluation of hypertension.  Patient reports she was getting an iron infusion and noted to be hypertensive.  Patient has documented history of hypertension but she states she has not been on her medication for the last month.  Patient currently denying any symptoms.  On initial ED arrival, patient is slightly tachycardic and hypertensive.  Vital signs otherwise stable.  No neuro deficits noted on exam.  Suspect that her blood pressure is elevated secondary to noncompliance with medication.  Will plan for EKG and give dose of home medicine and reassess.  Patient previously been on Eliquis for treatment of DVT but had to stop to get surgery.  Patient not currently having any swelling of her legs.  No chest pain, difficulty breathing.  Patient's blood sugar is 167.  She does report that she has been compliant with her insulin.  Patient given dose of her carvedilol.  Her heart rate improved.  Blood pressure went down slightly to 173/102.  She is asymptomatic at this time.  Her blood pressure is likely elevated secondary to noncompliance.  No indication for acute emergent intervention  here in the ED.  We will plan to give patient her prescriptions for her amlodipine and her carvedilol.  I discussed with patient the  importance of taking her medication.  Additionally, I instructed patient to follow-up with her primary care doctor as directed.  I reviewed patient records.  She had taken Eliquis for almost 3 months and had stopped it to have eye surgery.  Patient states she had followed up with her doctors that her DVTs of her legs have improved.  At this time, she is having no swelling or pain in legs.  No indication to restart Eliquis. At this time, patient exhibits no emergent life-threatening condition that require further evaluation in ED. Patient had ample opportunity for questions and discussion. All patient's questions were answered with full understanding. Strict return precautions discussed. Patient expresses understanding and agreement to plan.   Final Clinical Impressions(s) / ED Diagnoses   Final diagnoses:  Hypertension, unspecified type    ED Discharge Orders         Ordered    amLODipine (NORVASC) 10 MG tablet  Daily     12/06/18 1520    carvedilol (COREG) 12.5 MG tablet  2 times daily with meals     12/06/18 1520           Desma Mcgregor 12/06/18 1543    Tegeler, Gwenyth Allegra, MD 12/06/18 [phone]

## 2018-12-06 NOTE — Progress Notes (Signed)
Pt post clonidine 0.1 // pt continues to be hypertensive.  See vitals, Dr Cleone Slim office was called and spoke with Mechele Claude and discussed her vitals to this point, she stated to reschedule aranesp inj. and send to ED but would like me to page Dr Hollie Salk first. Dr was paged. Paged x 2 without call back, called [facility] was called again, spoke with PA Matt. He spoke with Dr Hollie Salk and confirmed to go to ED and reschedule. Pt is unsure of the last time she took her BP meds. She states she doesn't know what works and doesn't work for her. Pt was rescheduled and was sent to ER.

## 2018-12-06 NOTE — Discharge Instructions (Signed)
As we discussed, it is very important that you take your blood pressure medications.  I provided you with a prescription for a few weeks until you are able to be seen by [facility] physicians.  Please call them and arrange for an appointment to establish primary care.  Return the emergency department for any chest pain, difficulty breathing, vision changes, numbness/weakness of your arms or legs or any other worsening or concerning symptoms.

## 2018-12-14 ENCOUNTER — Ambulatory Visit (HOSPITAL_COMMUNITY)
Admission: RE | Admit: 2018-12-14 | Discharge: 2018-12-14 | Disposition: A | Discharge status: Home / Self Care | Payer: Federal, State, Local not specified - PPO | Source: Ambulatory Visit | Attending: Nephrology | Admitting: Nephrology

## 2018-12-14 VITALS — BP 147/72 | HR 90 | Temp 98.3°F | Resp 20

## 2018-12-14 DIAGNOSIS — N184 Chronic kidney disease, stage 4 (severe): Secondary | ICD-10-CM | POA: Insufficient documentation

## 2018-12-14 LAB — POCT HEMOGLOBIN-HEMACUE: Hemoglobin: 8.8 g/dL — ABNORMAL LOW (ref 12.0–15.0)

## 2018-12-14 MED ORDER — DARBEPOETIN ALFA 60 MCG/0.3ML IJ SOSY
60.0000 ug | PREFILLED_SYRINGE | INTRAMUSCULAR | Status: DC
  Administered 2018-12-14: 60 ug via SUBCUTANEOUS

## 2018-12-14 MED ORDER — DARBEPOETIN ALFA 60 MCG/0.3ML IJ SOSY
PREFILLED_SYRINGE | INTRAMUSCULAR | Status: AC
  Administered 2018-12-14: 60 ug via SUBCUTANEOUS
  Filled 2018-12-14: qty 0.3

## 2018-12-15 DIAGNOSIS — E1140 Type 2 diabetes mellitus with diabetic neuropathy, unspecified: Secondary | ICD-10-CM | POA: Diagnosis not present

## 2018-12-15 DIAGNOSIS — E11319 Type 2 diabetes mellitus with unspecified diabetic retinopathy without macular edema: Secondary | ICD-10-CM | POA: Diagnosis not present

## 2018-12-15 DIAGNOSIS — E1122 Type 2 diabetes mellitus with diabetic chronic kidney disease: Secondary | ICD-10-CM | POA: Diagnosis not present

## 2018-12-15 DIAGNOSIS — M7989 Other specified soft tissue disorders: Secondary | ICD-10-CM | POA: Diagnosis not present

## 2018-12-17 DIAGNOSIS — N189 Chronic kidney disease, unspecified: Secondary | ICD-10-CM | POA: Diagnosis not present

## 2018-12-17 DIAGNOSIS — E1122 Type 2 diabetes mellitus with diabetic chronic kidney disease: Secondary | ICD-10-CM | POA: Diagnosis not present

## 2018-12-17 DIAGNOSIS — M7989 Other specified soft tissue disorders: Secondary | ICD-10-CM | POA: Diagnosis not present

## 2018-12-20 ENCOUNTER — Encounter (HOSPITAL_COMMUNITY): Payer: Federal, State, Local not specified - PPO

## 2018-12-28 ENCOUNTER — Encounter (HOSPITAL_COMMUNITY)
Admission: RE | Admit: 2018-12-28 | Discharge: 2018-12-28 | Disposition: A | Discharge status: Home / Self Care | Payer: Federal, State, Local not specified - PPO | Source: Ambulatory Visit | Attending: Nephrology | Admitting: Nephrology

## 2018-12-28 DIAGNOSIS — D631 Anemia in chronic kidney disease: Secondary | ICD-10-CM | POA: Insufficient documentation

## 2018-12-28 DIAGNOSIS — N184 Chronic kidney disease, stage 4 (severe): Secondary | ICD-10-CM

## 2018-12-28 DIAGNOSIS — N185 Chronic kidney disease, stage 5: Secondary | ICD-10-CM | POA: Diagnosis not present

## 2018-12-28 LAB — FERRITIN: Ferritin: 630 ng/mL — ABNORMAL HIGH (ref 11–307)

## 2018-12-28 LAB — IRON AND TIBC
Iron: 25 ug/dL — ABNORMAL LOW (ref 28–170)
Saturation Ratios: 16 % (ref 10.4–31.8)
TIBC: 160 ug/dL — ABNORMAL LOW (ref 250–450)
UIBC: 135 ug/dL

## 2018-12-28 LAB — POCT HEMOGLOBIN-HEMACUE: Hemoglobin: 8.2 g/dL — ABNORMAL LOW (ref 12.0–15.0)

## 2018-12-28 MED ORDER — DARBEPOETIN ALFA 60 MCG/0.3ML IJ SOSY
PREFILLED_SYRINGE | INTRAMUSCULAR | Status: AC
  Filled 2018-12-28: qty 0.3

## 2018-12-28 MED ORDER — DARBEPOETIN ALFA 60 MCG/0.3ML IJ SOSY
60.0000 ug | PREFILLED_SYRINGE | INTRAMUSCULAR | Status: DC
  Administered 2018-12-28: 60 ug via SUBCUTANEOUS

## 2018-12-29 DIAGNOSIS — H2513 Age-related nuclear cataract, bilateral: Secondary | ICD-10-CM | POA: Diagnosis not present

## 2018-12-29 DIAGNOSIS — N185 Chronic kidney disease, stage 5: Secondary | ICD-10-CM | POA: Diagnosis not present

## 2018-12-29 DIAGNOSIS — I120 Hypertensive chronic kidney disease with stage 5 chronic kidney disease or end stage renal disease: Secondary | ICD-10-CM | POA: Diagnosis not present

## 2018-12-29 DIAGNOSIS — N189 Chronic kidney disease, unspecified: Secondary | ICD-10-CM | POA: Diagnosis not present

## 2018-12-29 DIAGNOSIS — N2581 Secondary hyperparathyroidism of renal origin: Secondary | ICD-10-CM | POA: Diagnosis not present

## 2018-12-29 DIAGNOSIS — D631 Anemia in chronic kidney disease: Secondary | ICD-10-CM | POA: Diagnosis not present

## 2018-12-30 ENCOUNTER — Other Ambulatory Visit: Payer: Self-pay

## 2018-12-30 ENCOUNTER — Other Ambulatory Visit (HOSPITAL_COMMUNITY): Payer: Federal, State, Local not specified - PPO

## 2018-12-30 ENCOUNTER — Encounter (HOSPITAL_COMMUNITY): Payer: Self-pay | Admitting: *Deleted

## 2018-12-30 ENCOUNTER — Inpatient Hospital Stay (HOSPITAL_COMMUNITY)
Admission: EM | Admit: 2018-12-30 | Discharge: 2019-01-05 | DRG: 674 | Disposition: A | Discharge status: Home / Self Care | Payer: Federal, State, Local not specified - PPO | Attending: Internal Medicine | Admitting: Internal Medicine

## 2018-12-30 ENCOUNTER — Encounter (HOSPITAL_COMMUNITY): Payer: Federal, State, Local not specified - PPO

## 2018-12-30 ENCOUNTER — Encounter: Payer: Federal, State, Local not specified - PPO | Admitting: Vascular Surgery

## 2018-12-30 ENCOUNTER — Encounter: Payer: Self-pay | Admitting: Vascular Surgery

## 2018-12-30 DIAGNOSIS — E872 Acidosis, unspecified: Secondary | ICD-10-CM | POA: Diagnosis present

## 2018-12-30 DIAGNOSIS — D509 Iron deficiency anemia, unspecified: Secondary | ICD-10-CM | POA: Diagnosis not present

## 2018-12-30 DIAGNOSIS — N19 Unspecified kidney failure: Secondary | ICD-10-CM | POA: Diagnosis present

## 2018-12-30 DIAGNOSIS — Z86711 Personal history of pulmonary embolism: Secondary | ICD-10-CM | POA: Diagnosis not present

## 2018-12-30 DIAGNOSIS — E1121 Type 2 diabetes mellitus with diabetic nephropathy: Principal | ICD-10-CM | POA: Diagnosis present

## 2018-12-30 DIAGNOSIS — IMO0002 Reserved for concepts with insufficient information to code with codable children: Secondary | ICD-10-CM | POA: Diagnosis present

## 2018-12-30 DIAGNOSIS — N186 End stage renal disease: Secondary | ICD-10-CM | POA: Diagnosis not present

## 2018-12-30 DIAGNOSIS — Z992 Dependence on renal dialysis: Secondary | ICD-10-CM | POA: Diagnosis not present

## 2018-12-30 DIAGNOSIS — Z86718 Personal history of other venous thrombosis and embolism: Secondary | ICD-10-CM

## 2018-12-30 DIAGNOSIS — E1122 Type 2 diabetes mellitus with diabetic chronic kidney disease: Secondary | ICD-10-CM | POA: Diagnosis present

## 2018-12-30 DIAGNOSIS — Z794 Long term (current) use of insulin: Secondary | ICD-10-CM

## 2018-12-30 DIAGNOSIS — E875 Hyperkalemia: Secondary | ICD-10-CM | POA: Diagnosis not present

## 2018-12-30 DIAGNOSIS — E785 Hyperlipidemia, unspecified: Secondary | ICD-10-CM | POA: Diagnosis not present

## 2018-12-30 DIAGNOSIS — N184 Chronic kidney disease, stage 4 (severe): Secondary | ICD-10-CM | POA: Diagnosis not present

## 2018-12-30 DIAGNOSIS — E1165 Type 2 diabetes mellitus with hyperglycemia: Secondary | ICD-10-CM | POA: Diagnosis not present

## 2018-12-30 DIAGNOSIS — I10 Essential (primary) hypertension: Secondary | ICD-10-CM | POA: Diagnosis not present

## 2018-12-30 DIAGNOSIS — N185 Chronic kidney disease, stage 5: Secondary | ICD-10-CM | POA: Diagnosis not present

## 2018-12-30 DIAGNOSIS — Z419 Encounter for procedure for purposes other than remedying health state, unspecified: Secondary | ICD-10-CM

## 2018-12-30 DIAGNOSIS — R29810 Facial weakness: Secondary | ICD-10-CM | POA: Diagnosis present

## 2018-12-30 DIAGNOSIS — N2581 Secondary hyperparathyroidism of renal origin: Secondary | ICD-10-CM | POA: Diagnosis not present

## 2018-12-30 DIAGNOSIS — N189 Chronic kidney disease, unspecified: Secondary | ICD-10-CM

## 2018-12-30 DIAGNOSIS — R0602 Shortness of breath: Secondary | ICD-10-CM

## 2018-12-30 DIAGNOSIS — N179 Acute kidney failure, unspecified: Secondary | ICD-10-CM

## 2018-12-30 DIAGNOSIS — I120 Hypertensive chronic kidney disease with stage 5 chronic kidney disease or end stage renal disease: Secondary | ICD-10-CM | POA: Diagnosis not present

## 2018-12-30 DIAGNOSIS — Z452 Encounter for adjustment and management of vascular access device: Secondary | ICD-10-CM

## 2018-12-30 LAB — BASIC METABOLIC PANEL
Anion gap: 14 (ref 5–15)
BUN: 110 mg/dL — ABNORMAL HIGH (ref 6–20)
CO2: 13 mmol/L — ABNORMAL LOW (ref 22–32)
Calcium: 7.3 mg/dL — ABNORMAL LOW (ref 8.9–10.3)
Chloride: 111 mmol/L (ref 98–111)
Creatinine, Ser: 17.59 mg/dL — ABNORMAL HIGH (ref 0.44–1.00)
GFR calc Af Amer: 2 mL/min — ABNORMAL LOW (ref >60)
GFR calc non Af Amer: 2 mL/min — ABNORMAL LOW (ref >60)
Glucose, Bld: 238 mg/dL — ABNORMAL HIGH (ref 70–99)
Potassium: 5.8 mmol/L — ABNORMAL HIGH (ref 3.5–5.1)
Sodium: 138 mmol/L (ref 135–145)

## 2018-12-30 LAB — CBC
HCT: 23.7 % — ABNORMAL LOW (ref 36.0–46.0)
Hemoglobin: 7 g/dL — ABNORMAL LOW (ref 12.0–15.0)
MCH: 22.9 pg — ABNORMAL LOW (ref 26.0–34.0)
MCHC: 29.5 g/dL — ABNORMAL LOW (ref 30.0–36.0)
MCV: 77.5 fL — ABNORMAL LOW (ref 80.0–100.0)
Platelets: 281 10*3/uL (ref 150–400)
RBC: 3.06 MIL/uL — ABNORMAL LOW (ref 3.87–5.11)
RDW: 15.9 % — ABNORMAL HIGH (ref 11.5–15.5)
WBC: 11.7 10*3/uL — ABNORMAL HIGH (ref 4.0–10.5)
nRBC: 0 % (ref 0.0–0.2)

## 2018-12-30 NOTE — ED Triage Notes (Signed)
Pt reports that she noticed shortness of breath tonight when up and walking and not able to get comfortable because she could not breathe well. Reports all over body aches, denies fevers. Pt reports she just started taking Lasix yesterday for fluid in her legs

## 2018-12-31 ENCOUNTER — Inpatient Hospital Stay (HOSPITAL_COMMUNITY): Payer: Federal, State, Local not specified - PPO

## 2018-12-31 ENCOUNTER — Inpatient Hospital Stay (HOSPITAL_COMMUNITY): Payer: Federal, State, Local not specified - PPO | Admitting: Certified Registered"

## 2018-12-31 ENCOUNTER — Encounter (HOSPITAL_COMMUNITY): Payer: Self-pay | Admitting: Critical Care Medicine

## 2018-12-31 ENCOUNTER — Encounter (HOSPITAL_COMMUNITY)
Admission: EM | Disposition: A | Discharge status: Home / Self Care | Payer: Self-pay | Source: Home / Self Care | Attending: Internal Medicine

## 2018-12-31 DIAGNOSIS — R0602 Shortness of breath: Secondary | ICD-10-CM

## 2018-12-31 DIAGNOSIS — E875 Hyperkalemia: Secondary | ICD-10-CM | POA: Diagnosis not present

## 2018-12-31 DIAGNOSIS — N179 Acute kidney failure, unspecified: Secondary | ICD-10-CM | POA: Diagnosis not present

## 2018-12-31 DIAGNOSIS — I10 Essential (primary) hypertension: Secondary | ICD-10-CM | POA: Diagnosis not present

## 2018-12-31 DIAGNOSIS — E872 Acidosis, unspecified: Secondary | ICD-10-CM | POA: Diagnosis present

## 2018-12-31 DIAGNOSIS — Z01810 Encounter for preprocedural cardiovascular examination: Secondary | ICD-10-CM | POA: Diagnosis not present

## 2018-12-31 DIAGNOSIS — Z86711 Personal history of pulmonary embolism: Secondary | ICD-10-CM | POA: Diagnosis not present

## 2018-12-31 DIAGNOSIS — E1165 Type 2 diabetes mellitus with hyperglycemia: Secondary | ICD-10-CM

## 2018-12-31 DIAGNOSIS — N185 Chronic kidney disease, stage 5: Secondary | ICD-10-CM

## 2018-12-31 DIAGNOSIS — Z86718 Personal history of other venous thrombosis and embolism: Secondary | ICD-10-CM | POA: Diagnosis not present

## 2018-12-31 DIAGNOSIS — E1122 Type 2 diabetes mellitus with diabetic chronic kidney disease: Secondary | ICD-10-CM | POA: Diagnosis not present

## 2018-12-31 DIAGNOSIS — Z992 Dependence on renal dialysis: Secondary | ICD-10-CM | POA: Diagnosis not present

## 2018-12-31 DIAGNOSIS — J90 Pleural effusion, not elsewhere classified: Secondary | ICD-10-CM | POA: Diagnosis not present

## 2018-12-31 DIAGNOSIS — E1121 Type 2 diabetes mellitus with diabetic nephropathy: Secondary | ICD-10-CM | POA: Diagnosis present

## 2018-12-31 DIAGNOSIS — N189 Chronic kidney disease, unspecified: Secondary | ICD-10-CM | POA: Diagnosis present

## 2018-12-31 DIAGNOSIS — D631 Anemia in chronic kidney disease: Secondary | ICD-10-CM | POA: Diagnosis not present

## 2018-12-31 DIAGNOSIS — E785 Hyperlipidemia, unspecified: Secondary | ICD-10-CM | POA: Diagnosis present

## 2018-12-31 DIAGNOSIS — I129 Hypertensive chronic kidney disease with stage 1 through stage 4 chronic kidney disease, or unspecified chronic kidney disease: Secondary | ICD-10-CM | POA: Diagnosis not present

## 2018-12-31 DIAGNOSIS — E113593 Type 2 diabetes mellitus with proliferative diabetic retinopathy without macular edema, bilateral: Secondary | ICD-10-CM | POA: Diagnosis not present

## 2018-12-31 DIAGNOSIS — R29810 Facial weakness: Secondary | ICD-10-CM | POA: Diagnosis present

## 2018-12-31 DIAGNOSIS — D509 Iron deficiency anemia, unspecified: Secondary | ICD-10-CM | POA: Diagnosis present

## 2018-12-31 DIAGNOSIS — E8770 Fluid overload, unspecified: Secondary | ICD-10-CM | POA: Diagnosis not present

## 2018-12-31 DIAGNOSIS — Z794 Long term (current) use of insulin: Secondary | ICD-10-CM | POA: Diagnosis not present

## 2018-12-31 DIAGNOSIS — Z452 Encounter for adjustment and management of vascular access device: Secondary | ICD-10-CM | POA: Diagnosis not present

## 2018-12-31 DIAGNOSIS — N19 Unspecified kidney failure: Secondary | ICD-10-CM | POA: Diagnosis present

## 2018-12-31 DIAGNOSIS — N184 Chronic kidney disease, stage 4 (severe): Secondary | ICD-10-CM

## 2018-12-31 DIAGNOSIS — N2581 Secondary hyperparathyroidism of renal origin: Secondary | ICD-10-CM | POA: Diagnosis present

## 2018-12-31 DIAGNOSIS — N186 End stage renal disease: Secondary | ICD-10-CM | POA: Diagnosis not present

## 2018-12-31 HISTORY — PX: AV FISTULA PLACEMENT: SHX1204

## 2018-12-31 HISTORY — PX: INSERTION OF DIALYSIS CATHETER: SHX1324

## 2018-12-31 LAB — BASIC METABOLIC PANEL
Anion gap: 15 (ref 5–15)
BUN: 112 mg/dL — ABNORMAL HIGH (ref 6–20)
CO2: 12 mmol/L — ABNORMAL LOW (ref 22–32)
Calcium: 7.5 mg/dL — ABNORMAL LOW (ref 8.9–10.3)
Chloride: 111 mmol/L (ref 98–111)
Creatinine, Ser: 17.13 mg/dL — ABNORMAL HIGH (ref 0.44–1.00)
GFR calc Af Amer: 2 mL/min — ABNORMAL LOW (ref >60)
GFR calc non Af Amer: 2 mL/min — ABNORMAL LOW (ref >60)
Glucose, Bld: 164 mg/dL — ABNORMAL HIGH (ref 70–99)
Potassium: 5.9 mmol/L — ABNORMAL HIGH (ref 3.5–5.1)
Sodium: 138 mmol/L (ref 135–145)

## 2018-12-31 LAB — CBC
HCT: 23.2 % — ABNORMAL LOW (ref 36.0–46.0)
Hemoglobin: 6.8 g/dL — CL (ref 12.0–15.0)
MCH: 22.9 pg — ABNORMAL LOW (ref 26.0–34.0)
MCHC: 29.3 g/dL — ABNORMAL LOW (ref 30.0–36.0)
MCV: 78.1 fL — ABNORMAL LOW (ref 80.0–100.0)
Platelets: 280 10*3/uL (ref 150–400)
RBC: 2.97 MIL/uL — ABNORMAL LOW (ref 3.87–5.11)
RDW: 16 % — ABNORMAL HIGH (ref 11.5–15.5)
WBC: 11 10*3/uL — ABNORMAL HIGH (ref 4.0–10.5)
nRBC: 0 % (ref 0.0–0.2)

## 2018-12-31 LAB — GLUCOSE, CAPILLARY
Glucose-Capillary: 145 mg/dL — ABNORMAL HIGH (ref 70–99)
Glucose-Capillary: 143 mg/dL — ABNORMAL HIGH (ref 70–99)
Glucose-Capillary: 97 mg/dL (ref 70–99)
Glucose-Capillary: 97 mg/dL (ref 70–99)
Glucose-Capillary: 92 mg/dL (ref 70–99)
Glucose-Capillary: 118 mg/dL — ABNORMAL HIGH (ref 70–99)

## 2018-12-31 LAB — PREPARE RBC (CROSSMATCH)

## 2018-12-31 LAB — IRON AND TIBC
Iron: 31 ug/dL (ref 28–170)
Saturation Ratios: 22 % (ref 10.4–31.8)
TIBC: 143 ug/dL — ABNORMAL LOW (ref 250–450)
UIBC: 112 ug/dL

## 2018-12-31 LAB — FERRITIN: Ferritin: 547 ng/mL — ABNORMAL HIGH (ref 11–307)

## 2018-12-31 LAB — MRSA PCR SCREENING: MRSA by PCR: NEGATIVE

## 2018-12-31 LAB — APTT: aPTT: 32 seconds (ref 24–36)

## 2018-12-31 LAB — PHOSPHORUS: Phosphorus: >30 mg/dL — ABNORMAL HIGH (ref 2.5–4.6)

## 2018-12-31 LAB — PROTIME-INR
INR: 1.06
Prothrombin Time: 13.7 seconds (ref 11.4–15.2)

## 2018-12-31 LAB — ALT: ALT: 9 U/L (ref 0–44)

## 2018-12-31 SURGERY — INSERTION OF DIALYSIS CATHETER
Anesthesia: General | Site: Chest

## 2018-12-31 MED ORDER — CALCIUM ACETATE (PHOS BINDER) 667 MG PO CAPS
1334.0000 mg | ORAL_CAPSULE | Freq: Three times a day (TID) | ORAL | Status: DC
  Administered 2019-01-01 – 2019-01-05 (×12): 1334 mg via ORAL
  Filled 2018-12-31 (×12): qty 2

## 2018-12-31 MED ORDER — PHENYLEPHRINE 40 MCG/ML (10ML) SYRINGE FOR IV PUSH (FOR BLOOD PRESSURE SUPPORT)
PREFILLED_SYRINGE | INTRAVENOUS | Status: AC
  Filled 2018-12-31: qty 10

## 2018-12-31 MED ORDER — SODIUM CHLORIDE 0.9 % IV SOLN
1.5000 g | INTRAVENOUS | Status: AC
  Administered 2018-12-31: 1.5 g via INTRAVENOUS
  Filled 2018-12-31: qty 1.5

## 2018-12-31 MED ORDER — CHLORHEXIDINE GLUCONATE CLOTH 2 % EX PADS
6.0000 | MEDICATED_PAD | Freq: Every day | CUTANEOUS | Status: DC
  Administered 2018-12-31 – 2019-01-04 (×4): 6 via TOPICAL

## 2018-12-31 MED ORDER — SODIUM CHLORIDE 0.9% IV SOLUTION
Freq: Once | INTRAVENOUS | Status: AC
  Administered 2018-12-31: 23:00:00 via INTRAVENOUS

## 2018-12-31 MED ORDER — DEXAMETHASONE SODIUM PHOSPHATE 10 MG/ML IJ SOLN
INTRAMUSCULAR | Status: AC
  Filled 2018-12-31: qty 1

## 2018-12-31 MED ORDER — LIDOCAINE-PRILOCAINE 2.5-2.5 % EX CREA
1.0000 "application " | TOPICAL_CREAM | CUTANEOUS | Status: DC | PRN
  Filled 2018-12-31: qty 5

## 2018-12-31 MED ORDER — LIDOCAINE HCL (PF) 1 % IJ SOLN: 5.0000 mL | INTRAMUSCULAR | Status: DC | PRN

## 2018-12-31 MED ORDER — AMLODIPINE BESYLATE 10 MG PO TABS
10.0000 mg | ORAL_TABLET | Freq: Every day | ORAL | Status: DC
  Administered 2019-01-01 – 2019-01-05 (×5): 10 mg via ORAL
  Filled 2018-12-31 (×5): qty 1

## 2018-12-31 MED ORDER — OXYCODONE HCL 5 MG PO TABS: 5.0000 mg | ORAL_TABLET | Freq: Once | ORAL | Status: DC | PRN

## 2018-12-31 MED ORDER — SODIUM BICARBONATE 8.4 % IV SOLN
50.0000 meq | Freq: Once | INTRAVENOUS | Status: AC
  Administered 2018-12-31: 50 meq via INTRAVENOUS
  Filled 2018-12-31: qty 50

## 2018-12-31 MED ORDER — ONDANSETRON HCL 4 MG/2ML IJ SOLN: 4.0000 mg | Freq: Four times a day (QID) | INTRAMUSCULAR | Status: DC | PRN

## 2018-12-31 MED ORDER — ALTEPLASE 2 MG IJ SOLR
2.0000 mg | Freq: Once | INTRAMUSCULAR | Status: DC | PRN
  Filled 2018-12-31: qty 2

## 2018-12-31 MED ORDER — PROMETHAZINE HCL 25 MG/ML IJ SOLN: 6.2500 mg | INTRAMUSCULAR | Status: DC | PRN

## 2018-12-31 MED ORDER — ONDANSETRON HCL 4 MG PO TABS: 4.0000 mg | ORAL_TABLET | Freq: Four times a day (QID) | ORAL | Status: DC | PRN

## 2018-12-31 MED ORDER — PAPAVERINE HCL 30 MG/ML IJ SOLN
INTRAMUSCULAR | Status: AC
  Filled 2018-12-31: qty 2

## 2018-12-31 MED ORDER — MIDAZOLAM HCL 5 MG/5ML IJ SOLN
INTRAMUSCULAR | Status: DC | PRN
  Administered 2018-12-31: 2 mg via INTRAVENOUS

## 2018-12-31 MED ORDER — ONDANSETRON HCL 4 MG/2ML IJ SOLN
INTRAMUSCULAR | Status: AC
  Filled 2018-12-31: qty 2

## 2018-12-31 MED ORDER — OXYCODONE HCL 5 MG/5ML PO SOLN: 5.0000 mg | Freq: Once | ORAL | Status: DC | PRN

## 2018-12-31 MED ORDER — LIDOCAINE HCL (PF) 1 % IJ SOLN
INTRAMUSCULAR | Status: AC
  Filled 2018-12-31: qty 30

## 2018-12-31 MED ORDER — MIDAZOLAM HCL 2 MG/2ML IJ SOLN
INTRAMUSCULAR | Status: AC
  Filled 2018-12-31: qty 2

## 2018-12-31 MED ORDER — CALCIUM GLUCONATE-NACL 1-0.675 GM/50ML-% IV SOLN
1.0000 g | Freq: Once | INTRAVENOUS | Status: AC
  Administered 2018-12-31: 1000 mg via INTRAVENOUS
  Filled 2018-12-31: qty 50

## 2018-12-31 MED ORDER — PROPOFOL 10 MG/ML IV BOLUS
INTRAVENOUS | Status: DC | PRN
  Administered 2018-12-31: 20 mg via INTRAVENOUS
  Administered 2018-12-31: 150 mg via INTRAVENOUS

## 2018-12-31 MED ORDER — THROMBIN (RECOMBINANT) 20000 UNITS EX SOLR
CUTANEOUS | Status: AC
  Filled 2018-12-31: qty 20000

## 2018-12-31 MED ORDER — DARBEPOETIN ALFA 60 MCG/0.3ML IJ SOSY
PREFILLED_SYRINGE | INTRAMUSCULAR | Status: AC
  Administered 2018-12-31: 60 ug via INTRAVENOUS
  Filled 2018-12-31: qty 0.3

## 2018-12-31 MED ORDER — SODIUM CHLORIDE 0.9 % IV SOLN
125.0000 mg | INTRAVENOUS | Status: DC
  Administered 2018-12-31 – 2019-01-02 (×2): 125 mg via INTRAVENOUS
  Filled 2018-12-31 (×4): qty 10

## 2018-12-31 MED ORDER — FENTANYL CITRATE (PF) 100 MCG/2ML IJ SOLN
INTRAMUSCULAR | Status: DC | PRN
  Administered 2018-12-31: 50 ug via INTRAVENOUS

## 2018-12-31 MED ORDER — HYDROMORPHONE HCL 1 MG/ML IJ SOLN
0.2500 mg | INTRAMUSCULAR | Status: DC | PRN
  Administered 2018-12-31: 0.25 mg via INTRAVENOUS

## 2018-12-31 MED ORDER — IOPAMIDOL (ISOVUE-300) INJECTION 61%
INTRAVENOUS | Status: AC
  Filled 2018-12-31: qty 50

## 2018-12-31 MED ORDER — HEPARIN SOD (PORK) LOCK FLUSH 100 UNIT/ML IV SOLN
INTRAVENOUS | Status: AC
  Filled 2018-12-31: qty 5

## 2018-12-31 MED ORDER — LIDOCAINE HCL (PF) 1 % IJ SOLN
INTRAMUSCULAR | Status: DC | PRN
  Administered 2018-12-31: 30 mL

## 2018-12-31 MED ORDER — HEPARIN SODIUM (PORCINE) 1000 UNIT/ML DIALYSIS
1000.0000 [IU] | INTRAMUSCULAR | Status: DC | PRN
  Administered 2018-12-31: 1000 [IU] via INTRAVENOUS_CENTRAL
  Filled 2018-12-31 (×2): qty 1

## 2018-12-31 MED ORDER — PENTAFLUOROPROP-TETRAFLUOROETH EX AERO: 1.0000 "application " | INHALATION_SPRAY | CUTANEOUS | Status: DC | PRN

## 2018-12-31 MED ORDER — SODIUM CHLORIDE 0.9 % IV SOLN
INTRAVENOUS | Status: AC
  Filled 2018-12-31: qty 1.2

## 2018-12-31 MED ORDER — CARVEDILOL 12.5 MG PO TABS
12.5000 mg | ORAL_TABLET | Freq: Two times a day (BID) | ORAL | Status: DC
  Administered 2018-12-31 – 2019-01-05 (×9): 12.5 mg via ORAL
  Filled 2018-12-31 (×9): qty 1

## 2018-12-31 MED ORDER — SODIUM CHLORIDE 0.9 % IV SOLN
INTRAVENOUS | Status: DC | PRN
  Administered 2018-12-31: 16:00:00

## 2018-12-31 MED ORDER — 0.9 % SODIUM CHLORIDE (POUR BTL) OPTIME
TOPICAL | Status: DC | PRN
  Administered 2018-12-31: 1000 mL

## 2018-12-31 MED ORDER — HEPARIN SODIUM (PORCINE) 1000 UNIT/ML IJ SOLN
INTRAMUSCULAR | Status: AC
  Administered 2018-12-31: 1000 [IU] via INTRAVENOUS_CENTRAL
  Filled 2018-12-31: qty 4

## 2018-12-31 MED ORDER — PROPOFOL 1000 MG/100ML IV EMUL
INTRAVENOUS | Status: AC
  Filled 2018-12-31: qty 100

## 2018-12-31 MED ORDER — ROCURONIUM BROMIDE 50 MG/5ML IV SOSY
PREFILLED_SYRINGE | INTRAVENOUS | Status: AC
  Filled 2018-12-31: qty 10

## 2018-12-31 MED ORDER — INSULIN ASPART 100 UNIT/ML ~~LOC~~ SOLN
0.0000 [IU] | Freq: Three times a day (TID) | SUBCUTANEOUS | Status: DC
  Administered 2018-12-31: 1 [IU] via SUBCUTANEOUS
  Administered 2019-01-01 – 2019-01-02 (×5): 2 [IU] via SUBCUTANEOUS
  Administered 2019-01-03 (×2): 3 [IU] via SUBCUTANEOUS
  Administered 2019-01-04: 1 [IU] via SUBCUTANEOUS
  Administered 2019-01-04: 7 [IU] via SUBCUTANEOUS
  Administered 2019-01-04: 3 [IU] via SUBCUTANEOUS
  Administered 2019-01-05: 1 [IU] via SUBCUTANEOUS
  Administered 2019-01-05: 3 [IU] via SUBCUTANEOUS

## 2018-12-31 MED ORDER — SODIUM CHLORIDE 0.9 % IV SOLN
100.0000 mL | INTRAVENOUS | Status: DC | PRN
  Administered 2018-12-31: 14:00:00 via INTRAVENOUS

## 2018-12-31 MED ORDER — LIDOCAINE-EPINEPHRINE (PF) 1 %-1:200000 IJ SOLN
INTRAMUSCULAR | Status: AC
  Filled 2018-12-31: qty 30

## 2018-12-31 MED ORDER — LIDOCAINE 2% (20 MG/ML) 5 ML SYRINGE
INTRAMUSCULAR | Status: AC
  Filled 2018-12-31: qty 5

## 2018-12-31 MED ORDER — FENTANYL CITRATE (PF) 250 MCG/5ML IJ SOLN
INTRAMUSCULAR | Status: AC
  Filled 2018-12-31: qty 5

## 2018-12-31 MED ORDER — ALUMINUM HYDROXIDE GEL 320 MG/5ML PO SUSP: 30.0000 mL | Freq: Three times a day (TID) | ORAL | Status: DC

## 2018-12-31 MED ORDER — LIDOCAINE 2% (20 MG/ML) 5 ML SYRINGE
INTRAMUSCULAR | Status: DC | PRN
  Administered 2018-12-31: 80 mg via INTRAVENOUS

## 2018-12-31 MED ORDER — HEPARIN SODIUM (PORCINE) 1000 UNIT/ML IJ SOLN
INTRAMUSCULAR | Status: DC | PRN
  Administered 2018-12-31: 3400 [IU]

## 2018-12-31 MED ORDER — SODIUM CHLORIDE 0.9 % IV SOLN: 100.0000 mL | INTRAVENOUS | Status: DC | PRN

## 2018-12-31 MED ORDER — ALUMINUM HYDROXIDE GEL 320 MG/5ML PO SUSP
30.0000 mL | Freq: Three times a day (TID) | ORAL | Status: DC
  Filled 2018-12-31 (×4): qty 30

## 2018-12-31 MED ORDER — ACETAMINOPHEN 325 MG PO TABS: 650.0000 mg | ORAL_TABLET | Freq: Four times a day (QID) | ORAL | Status: DC | PRN

## 2018-12-31 MED ORDER — HYDROMORPHONE HCL 1 MG/ML IJ SOLN
INTRAMUSCULAR | Status: AC
  Filled 2018-12-31: qty 1

## 2018-12-31 MED ORDER — HEPARIN SODIUM (PORCINE) 1000 UNIT/ML IJ SOLN
INTRAMUSCULAR | Status: DC | PRN
  Administered 2018-12-31: 5000 [IU] via INTRAVENOUS

## 2018-12-31 MED ORDER — ACETAMINOPHEN 650 MG RE SUPP: 650.0000 mg | Freq: Four times a day (QID) | RECTAL | Status: DC | PRN

## 2018-12-31 MED ORDER — DARBEPOETIN ALFA 60 MCG/0.3ML IJ SOSY
60.0000 ug | PREFILLED_SYRINGE | INTRAMUSCULAR | Status: DC
  Administered 2018-12-31: 60 ug via INTRAVENOUS

## 2018-12-31 MED ORDER — INSULIN DETEMIR 100 UNIT/ML ~~LOC~~ SOLN
4.0000 [IU] | Freq: Every day | SUBCUTANEOUS | Status: DC
  Administered 2018-12-31 – 2019-01-04 (×5): 4 [IU] via SUBCUTANEOUS
  Filled 2018-12-31 (×6): qty 0.04

## 2018-12-31 MED ORDER — HEPARIN SODIUM (PORCINE) 1000 UNIT/ML IJ SOLN
INTRAMUSCULAR | Status: AC
  Filled 2018-12-31: qty 1

## 2018-12-31 MED ORDER — SODIUM BICARBONATE 650 MG PO TABS
650.0000 mg | ORAL_TABLET | Freq: Three times a day (TID) | ORAL | Status: DC
  Administered 2018-12-31 – 2019-01-04 (×11): 650 mg via ORAL
  Filled 2018-12-31 (×11): qty 1

## 2018-12-31 MED ORDER — SODIUM CHLORIDE 0.9 % IV SOLN
INTRAVENOUS | Status: DC
  Administered 2018-12-31: 15:00:00 via INTRAVENOUS

## 2018-12-31 MED ORDER — SODIUM CHLORIDE 0.9 % IV SOLN: 1.0000 g | Freq: Once | INTRAVENOUS | Status: DC

## 2018-12-31 MED ORDER — DEXAMETHASONE SODIUM PHOSPHATE 10 MG/ML IJ SOLN
INTRAMUSCULAR | Status: DC | PRN
  Administered 2018-12-31: 5 mg via INTRAVENOUS

## 2018-12-31 SURGICAL SUPPLY — 58 items
ARMBAND PINK RESTRICT EXTREMIT (MISCELLANEOUS) ×6 IMPLANT
BAG DECANTER FOR FLEXI CONT (MISCELLANEOUS) ×3 IMPLANT
BIOPATCH RED 1 DISK 7.0 (GAUZE/BANDAGES/DRESSINGS) ×3 IMPLANT
CANISTER SUCT 3000ML PPV (MISCELLANEOUS) ×3 IMPLANT
CANNULA VESSEL 3MM 2 BLNT TIP (CANNULA) ×3 IMPLANT
CATH PALINDROME RT-P 15FX23CM (CATHETERS) ×1 IMPLANT
CHLORAPREP W/TINT 26ML (MISCELLANEOUS) ×3 IMPLANT
CLIP VESOCCLUDE MED 6/CT (CLIP) ×3 IMPLANT
CLIP VESOCCLUDE SM WIDE 6/CT (CLIP) ×3 IMPLANT
COVER PROBE W GEL 5X96 (DRAPES) ×1 IMPLANT
COVER SURGICAL LIGHT HANDLE (MISCELLANEOUS) ×3 IMPLANT
COVER TRANSDUCER ULTRASND GEL (DRAPE) ×1 IMPLANT
COVER WAND RF STERILE (DRAPES) ×3 IMPLANT
DECANTER SPIKE VIAL GLASS SM (MISCELLANEOUS) ×3 IMPLANT
DERMABOND ADVANCED (GAUZE/BANDAGES/DRESSINGS) ×2
DERMABOND ADVANCED .7 DNX12 (GAUZE/BANDAGES/DRESSINGS) ×2 IMPLANT
DRAIN PENROSE 1/4X12 LTX STRL (WOUND CARE) ×4 IMPLANT
DRAPE C-ARM 42X72 X-RAY (DRAPES) ×3 IMPLANT
DRAPE CHEST BREAST 15X10 FENES (DRAPES) ×3 IMPLANT
DRAPE HALF SHEET 40X57 (DRAPES) ×3 IMPLANT
ELECT REM PT RETURN 9FT ADLT (ELECTROSURGICAL) ×3
ELECTRODE REM PT RTRN 9FT ADLT (ELECTROSURGICAL) ×2 IMPLANT
GAUZE 4X4 16PLY RFD (DISPOSABLE) ×3 IMPLANT
GLOVE BIO SURGEON STRL SZ7.5 (GLOVE) ×4 IMPLANT
GLOVE BIOGEL PI IND STRL 6.5 (GLOVE) IMPLANT
GLOVE BIOGEL PI INDICATOR 6.5 (GLOVE) ×2
GLOVE SURG SS PI 6.0 STRL IVOR (GLOVE) ×1 IMPLANT
GOWN STRL REUS W/ TWL LRG LVL3 (GOWN DISPOSABLE) ×6 IMPLANT
GOWN STRL REUS W/TWL LRG LVL3 (GOWN DISPOSABLE) ×5
KIT BASIN OR (CUSTOM PROCEDURE TRAY) ×3 IMPLANT
KIT TURNOVER KIT B (KITS) ×3 IMPLANT
LOOP VESSEL MINI RED (MISCELLANEOUS) IMPLANT
NDL 18GX1X1/2 (RX/OR ONLY) (NEEDLE) ×2 IMPLANT
NDL HYPO 25GX1X1/2 BEV (NEEDLE) ×2 IMPLANT
NEEDLE 18GX1X1/2 (RX/OR ONLY) (NEEDLE) ×3 IMPLANT
NEEDLE HYPO 25GX1X1/2 BEV (NEEDLE) ×3 IMPLANT
NS IRRIG 1000ML POUR BTL (IV SOLUTION) ×3 IMPLANT
PACK CV ACCESS (CUSTOM PROCEDURE TRAY) ×3 IMPLANT
PACK SURGICAL SETUP 50X90 (CUSTOM PROCEDURE TRAY) ×3 IMPLANT
PAD ARMBOARD 7.5X6 YLW CONV (MISCELLANEOUS) ×6 IMPLANT
SET MICROPUNCTURE 5F STIFF (MISCELLANEOUS) IMPLANT
SPONGE SURGIFOAM ABS GEL 100 (HEMOSTASIS) IMPLANT
SUT ETHILON 3 0 PS 1 (SUTURE) ×3 IMPLANT
SUT MNCRL AB 4-0 PS2 18 (SUTURE) ×2 IMPLANT
SUT PROLENE 6 0 BV (SUTURE) ×3 IMPLANT
SUT PROLENE 7 0 BV 1 (SUTURE) ×4 IMPLANT
SUT VIC AB 3-0 SH 27 (SUTURE) ×1
SUT VIC AB 3-0 SH 27X BRD (SUTURE) ×2 IMPLANT
SUT VICRYL 4-0 PS2 18IN ABS (SUTURE) ×3 IMPLANT
SYR 10ML LL (SYRINGE) ×3 IMPLANT
SYR 20CC LL (SYRINGE) ×6 IMPLANT
SYR 5ML LL (SYRINGE) ×3 IMPLANT
SYR CONTROL 10ML LL (SYRINGE) ×3 IMPLANT
TOWEL GREEN STERILE (TOWEL DISPOSABLE) ×6 IMPLANT
TOWEL GREEN STERILE FF (TOWEL DISPOSABLE) ×3 IMPLANT
UNDERPAD 30X30 (UNDERPADS AND DIAPERS) ×3 IMPLANT
WATER STERILE IRR 1000ML POUR (IV SOLUTION) ×3 IMPLANT
WIRE AMPLATZ SS-J .035X180CM (WIRE) IMPLANT

## 2018-12-31 NOTE — Progress Notes (Signed)
Received call from primary nurse asking me if I received a call from [facility] about blood being ready. I confirmed that I had, but reminded him as I had told him when I called him to confirm if he knew about the low Hgb that by the time they got type and screen and blood etc ready that pt would not have enough time left on tx to get blood in (and the volume from it back off), so that if he got orders for blood it would not be able to be ran on HD as pt was first tx and only 2 hrs and was already about 1/2 way through that tx when we spoke. (see note from 2015). He then said but pt doesn't have IV access. I confirmed w/ him that in report the day primary nurse, Vicente Males, RN reported that pt had 2 IV, a RH and and  RFA. The RH was NSL and she would make the RFA NSL before transport came to get her. I also let him know I had visualized both PIV when I assessed pt. I apologized that she was a super short tx, as first tx usually are, and that he would have to run the PRBC as I had made him aware of at 26

## 2018-12-31 NOTE — Progress Notes (Signed)
HD tx initiated via HD cath w/o problem Bilat ports: pull/push/flush equally w/o problem VSS Will continue to monitor while on HD tx 

## 2018-12-31 NOTE — Progress Notes (Signed)
Spoke w/ charge nurse Blanch Media, RN regarding the PRBC

## 2018-12-31 NOTE — ED Notes (Signed)
Pt reports she went to her doctor today and was told she had "2% kidney function" and was told to come to the ED

## 2018-12-31 NOTE — Progress Notes (Addendum)
Called primary RN to see if they were aware that the pre tx CBC I drew resulted w/ Hgb of 6.8. nurse stated he had been called by lab and was aware. He will be following up on orders for PRBC administration. Made him aware pt is only a 2 hr tx and almost half way through that and that if he got orders for PRBC admin that type and screen etc would not be done and ready prior to her coming off tx so that she would not be able to get blood admin on HD tx

## 2018-12-31 NOTE — Anesthesia Procedure Notes (Signed)
Procedure Name: LMA Insertion Date/Time: 12/31/2018 2:49 PM Performed by: Moshe Salisbury, CRNA Pre-anesthesia Checklist: Patient identified, Emergency Drugs available, Suction available and Patient being monitored Patient Re-evaluated:Patient Re-evaluated prior to induction Oxygen Delivery Method: Circle System Utilized Preoxygenation: Pre-oxygenation with 100% oxygen Induction Type: IV induction Ventilation: Mask ventilation without difficulty LMA: LMA inserted LMA Size: 4.0 Number of attempts: 1 Placement Confirmation: positive ETCO2 Tube secured with: Tape Dental Injury: Teeth and Oropharynx as per pre-operative assessment

## 2018-12-31 NOTE — Progress Notes (Signed)
Dr. Sabra Heck notified that patient has been eating ice chips until one hour ago. No new orders a this time. Okay to proceed with surgery.

## 2018-12-31 NOTE — Consult Note (Signed)
Colleen [name] Admit Date: 12/30/2018 12/31/2018 Colleen [name] Requesting Physician:  Colleen Boroughs MD  Reason for Consult:  Uremia, Hyperkalemia, Renal Failure, Anemia, HPI:  58 year old female followed by Dr. Hollie [name] in our office who presented to the emergency room after progressive renal failure identified at recent outpatient visits.  Patient with progressive nephrotic chronic kidney disease presumed etiology diabetic and hypertensive nephropathy.  Patient was seen on 1/22 by Dr. Hollie [name] and at that time BUN was 110, creatinine 16, potassium 5.7, bicarbonate 13.  Attempts were made for rapid vascular evaluation but she was unable to attend.  Patient was directed to come to the emergency room.  Patient endorses 1 month of progressive fatigue, anorexia, nausea/vomiting, dysgeusia.  Progressive edema including in the legs and the hands.  She has been dyspneic.  She does not have vascular access.  Here her potassium is 5.8, bicarbonate 13 with an anion gap of 14.  BUN Colleen [name] 10 and serum creatinine 17.6.  Hemoglobin 7.0 with MCV of 77.5.  Chest x-ray shows cardiomegaly, bilateral small pleural effusions, interstitial edema.  Patient received calcium gluconate in the emergency room.  No dysuria, urinary incontinence.  No NSAID use.  PMH Incudes:  Type 2 diabetes  Hypertension  History of DVT  Gout  Hyperlipidemia  Anemia, has been set up for iron and Aranesp as an outpatient.   Creatinine (mg/dL)  Date Value  08/24/2018 4.83 (HH)   Creatinine, Ser (mg/dL)  Date Value  12/30/2018 17.59 (H)  07/06/2018 4.10 (H)  06/07/2018 4.47 (H)  06/06/2018 4.50 (H)  06/05/2018 4.24 (H)  05/31/2018 4.17 (H)  05/30/2018 3.90 (H)  05/29/2018 3.78 (H)  05/28/2018 3.75 (H)  05/27/2018 4.10 (H)  ]  Balance of 12 systems is negative w/ exceptions as above  PMH  Past Medical History:  Diagnosis Date  . Chronic kidney disease   . Diabetes ([place])   . DVT (deep venous thrombosis) ([place])    . E-coli UTI   . Gout 06/05/2018  . HLD (hyperlipidemia) 06/05/2018  . PE (pulmonary embolism)   . 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 NO PAST SURGERIES     FH  Family History  Problem Relation Age of Onset  . CAD Neg Hx    SH  reports that she has never smoked. She has never used smokeless tobacco. She reports that she does not drink alcohol or use drugs. Allergies No Known Allergies Home medications Prior to Admission medications   Medication Sig Start Date End Date Taking? Authorizing Provider  amLODipine (NORVASC) 10 MG tablet Take 1 tablet (10 mg total) by mouth daily for 21 days. 12/06/18 12/31/26 Yes Colleen Napoleon, PA-C  carvedilol (COREG) 12.5 MG tablet Take 1 tablet (12.5 mg total) by mouth 2 (two) times daily with a meal for 21 days. 12/06/18 12/31/26 Yes Colleen Napoleon, PA-C  Insulin Detemir (LEVEMIR) 100 UNIT/ML Pen Inject 8 Units into the skin daily at 10 pm. Patient taking differently: Inject 8 Units into the skin at bedtime.  05/30/18  Yes Colleen Hockey, MD    Current Medications Scheduled Meds: . amLODipine  10 mg Oral Daily  . carvedilol  12.5 mg Oral BID WC  . insulin aspart  0-9 Units Subcutaneous TID WC  . insulin detemir  4 Units Subcutaneous QHS  . sodium bicarbonate  50 mEq Intravenous Once  .  sodium bicarbonate  650 mg Oral TID   Continuous Infusions: . calcium gluconate     PRN Meds:.acetaminophen **OR** acetaminophen, ondansetron **OR** ondansetron (ZOFRAN) IV  CBC Recent Labs  Lab 12/28/18 1110 12/30/18 2319  WBC  --  11.7*  HGB 8.2* 7.0*  HCT  --  23.7*  MCV  --  77.5*  PLT  --  021   Basic Metabolic Panel Recent Labs  Lab 12/30/18 2319  NA 138  K 5.8*  CL 111  CO2 13*  GLUCOSE 238*  BUN 110*  CREATININE 17.59*  CALCIUM 7.3*    Physical Exam  Blood pressure 138/77, pulse 98, temperature 98.5 F (36.9 C),  temperature source Oral, resp. rate 20, height 5\' 4"  (1.626 m), weight 81.2 kg, last menstrual period 12/08/2013, SpO2 93 %. GEN: No acute distress, appears unwell ENT: NCAT EYES: EOMI CV: Regular, normal rate, no rub. PULM: Diminished in the bases bilaterally, bronchial breath sounds present.  Normal in the superior lung fields, no wheezing ABD: Soft, nontender SKIN: No rashes or lesions EXT: 3+ edema in the lower extremities, symmetric; 2+ in the arms NEURO: Alert and oriented x3, no asterixis   Assessment 58 year old female with progressive nephrotic diabetic kidney disease now with uremia, worsening anemia, hypervolemia/pulmonary edema.  1. Nephrotic diabetic nephropathy now progressed to ESRD 2. Uremia 3. Hyperkalemia, mild 4. Metabolic acidosis, secondary to #1 5. Anemia, microcytic 6. No vascular access 7. Pulmonary edema/hypervolemia 8. Diabetes 9. Hypertension 10. History of DVT 11. Secondary hyperparathyroidism on calcitriol  Plan 1. At length discussed need and rationale for initiating hemodialysis.  Discussed why tunneled dialysis catheter is necessary at the current time.  Discussed need for AV fistula.  All questions answered. 2. Will consult vascular surgery for tunneled dialysis catheter and AV fistula 3. Hemodialysis #1 after TDC: 2h, 2K, 1L UF, Qb 250, no heparin; 4. Check Fe levels, will need ESA 5. Check PTH, Phos 6. CLIP pt in AM; likley to [facility] [place] 7. NPO for vascular acceass   Colleen Grippe MD [phone] pgr 12/31/2018, 3:34 AM

## 2018-12-31 NOTE — H&P (View-Only) (Signed)
[name] NOTE   MRN : [id number]  Reason for Consult: ESRD Referring Physician: Dr. Alfonso Ellis  History of Present Illness: 58 y/o female with progressive CKD, now ESRD.We have been asked to provide access and [facility] urgently.  She reported to the ED with CC of fatigue, edema in all 4 ext.    Past medical history includes right sides facial palsy since birth, DM, and HTN.  Hyperkalemia 5.8, Cr 17.3.  Renal is managing correction of K+ with bicarb, insulin and calcium gluconate.  She is right hand dominant and in agreement to proceed with access.     Current Facility-Administered Medications  Medication Dose Route Frequency Provider Last Rate Last Dose  . 0.9 %  sodium chloride infusion  100 mL Intravenous PRN Pearson Grippe B, MD      . 0.9 %  sodium chloride infusion  100 mL Intravenous PRN Pearson Grippe B, MD      . acetaminophen (TYLENOL) tablet 650 mg  650 mg Oral Q6H PRN Etta Quill, DO       Or  . acetaminophen (TYLENOL) suppository 650 mg  650 mg Rectal Q6H PRN Etta Quill, DO      . alteplase (CATHFLO ACTIVASE) injection 2 mg  2 mg Intracatheter Once PRN Pearson Grippe B, MD      . amLODipine (NORVASC) tablet 10 mg  10 mg Oral Daily Jennette Kettle M, DO      . calcium gluconate 1 g/ 50 mL sodium chloride IVPB  1 g Intravenous Once Varney Biles, MD 50 mL/hr at 12/31/18 0706 1,000 mg at 12/31/18 0706  . carvedilol (COREG) tablet 12.5 mg  12.5 mg Oral BID WC Etta Quill, DO      . Chlorhexidine Gluconate Cloth 2 % PADS 6 each  6 each Topical Q0600 [name], MD   6 each at 12/31/18 [phone]  . Darbepoetin Alfa (ARANESP) injection 60 mcg  60 mcg Intravenous Q Fri-HD Pearson Grippe B, MD      . heparin injection 1,000 Units  1,000 Units Dialysis PRN Pearson Grippe B, MD      . insulin aspart (novoLOG) injection 0-9 Units  0-9 Units Subcutaneous TID WC Etta Quill, DO   1 Units at 12/31/18 [phone]  . insulin detemir (LEVEMIR)  injection 4 Units  4 Units Subcutaneous QHS Alcario Drought, Jared M, DO      . lidocaine (PF) (XYLOCAINE) 1 % injection 5 mL  5 mL Intradermal PRN Pearson Grippe B, MD      . lidocaine-prilocaine (EMLA) cream 1 application  1 application Topical PRN [name], MD      . ondansetron [facility]) tablet 4 mg  4 mg Oral Q6H PRN Etta Quill, DO       Or  . ondansetron [facility]) injection 4 mg  4 mg Intravenous Q6H PRN Etta Quill, DO      . pentafluoroprop-tetrafluoroeth (GEBAUERS) aerosol 1 application  1 application Topical PRN Pearson Grippe B, MD      . sodium bicarbonate tablet 650 mg  650 mg Oral TID Etta Quill, DO        Pt meds include: Statin :No Betablocker: Yes ASA: No Other anticoagulants/antiplatelets: none  Past Medical History:  Diagnosis Date  . Chronic kidney disease   . Diabetes ([place])   . DVT (deep venous thrombosis) ([place])   . E-coli UTI   . Gout 06/05/2018  . HLD (hyperlipidemia) 06/05/2018  .  PE (pulmonary embolism)   . 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 NO PAST SURGERIES      Social History Social History   Tobacco Use  . Smoking status: Never Smoker  . Smokeless tobacco: Never Used  Substance Use Topics  . Alcohol use: No  . Drug use: No    Family History Family History  Problem Relation Age of Onset  . CAD Neg Hx     No Known Allergies   REVIEW OF SYSTEMS  General: [ ]  Weight loss, [ ]  Fever, [ ]  chills  Neurologic: [ ]  Dizziness, [ ]  Blackouts, [ ]  Seizure [ ]  Stroke, [ ]  "Mini stroke", [ ]  Slurred speech, [ ]  Temporary blindness; [ ]  weakness in arms or legs, [ ]  Hoarseness [ ]  Dysphagia Cardiac: [ ]  Chest pain/pressure, [x ] Shortness of breath at rest [ ]  Shortness of breath with exertion, [ ]  Atrial fibrillation or irregular heartbeat  Vascular: [ ]  Pain in legs with walking, [ ]  Pain in legs at rest, [ ]  Pain in legs at  night,  [ ]  Non-healing ulcer, [ ]  Blood clot in vein/DVT,   Pulmonary: [ ]  Home oxygen, [ ]  Productive cough, [ ]  Coughing up blood, [ ]  Asthma,  [ ]  Wheezing [ ]  COPD Musculoskeletal:  [ ]  Arthritis, [ ]  Low back pain, [x ] Joint pain Hematologic: [ ]  Easy Bruising, [ ]  Anemia; [ ]  Hepatitis Gastrointestinal: [ ]  Blood in stool, [ ]  Gastroesophageal Reflux/heartburn, Urinary: [x ] chronic Kidney disease, [ ]  on HD - [ ]  MWF or [ ]  TTHS, [ ]  Burning with urination, [ ]  Difficulty urinating Skin: [ ]  Rashes, [ ]  Wounds Psychological: [ ]  Anxiety, [ ]  Depression  Physical Examination Vitals:   12/31/18 0315 12/31/18 0330 12/31/18 0425 12/31/18 0538  BP:  (!) 141/73 138/71   Pulse: 100 98 96   Resp:      Temp:    99.4 F (37.4 C)  TempSrc:    Oral  SpO2: 94% 92% 94%   Weight:      Height:       Body mass index is 30.73 kg/m.  General:  WDWN in NAD Gait: Normal,  HENT: WNL, normocephalic with right facial droop baseline since birth Eyes: Pupils equal Pulmonary: normal non-labored breathing with decresed breath sounds at lung bases , without Rales, rhonchi,  wheezing Cardiac: RRR, without  Murmurs, rubs or gallops; Abdomen: soft, NT, no masses Skin: no rashes, ulcers noted;  no Gangrene , no cellulitis; no open wounds;   Vascular Exam/Pulses:significant edema faint palpable radial pulses, sensation intact, with active range of motion B UE   Musculoskeletal: no muscle wasting or atrophy; sever B UE/LE edema  Neurologic: A&O X 3; Appropriate Affect ;  SENSATION: normal; MOTOR FUNCTION: intact grossly Symmetric all 4 ext Speech is fluent/normal   Significant Diagnostic Studies: CBC Lab Results  Component Value Date   WBC 11.7 (H) 12/30/2018   HGB 7.0 (L) 12/30/2018   HCT 23.7 (L) 12/30/2018   MCV 77.5 (L) 12/30/2018   PLT 281 12/30/2018    BMET    Component Value Date/Time   NA 138 12/31/2018 0341   K 5.9 (H) 12/31/2018 0341   CL 111 12/31/2018 0341   CO2 12  (L) 12/31/2018 0341   GLUCOSE 164 (H) 12/31/2018 0341  BUN 112 (H) 12/31/2018 0341   CREATININE 17.13 (H) 12/31/2018 0341   CREATININE 4.83 (HH) 08/24/2018 1120   CALCIUM 7.5 (L) 12/31/2018 0341   GFRNONAA 2 (L) 12/31/2018 0341   GFRNONAA 9 (L) 08/24/2018 1120   GFRAA 2 (L) 12/31/2018 0341   GFRAA 11 (L) 08/24/2018 1120   Estimated Creatinine Clearance: 3.8 mL/min (A) (by C-G formula based on SCr of 17.13 mg/dL (H)).  COAG Lab Results  Component Value Date   INR 1.06 12/31/2018   INR 1.62 (H) 09/23/2013   INR 1.61 (H) 09/22/2013     Non-Invasive Vascular Imaging:  Pending vein mapping   ASSESSMENT/PLAN:  ESRD with fluid over load Hyperkalemia She is right hand dominant, so we will plan left UE av fistula verse graft and TDC placement today.  NPO.   Roxy Horseman 12/31/2018 7:15 AM   I have independently interviewed and examined patient and agree with PA assessment and plan above.  Plan will be for tunneled dialysis catheter and left arm access today.  Does have marginal vein which we will evaluate at the time of surgery.  She will otherwise need a graft.  Rondy Krupinski C. Donzetta Matters, MD [facility] [place] Office: [phone] Pager: [phone]

## 2018-12-31 NOTE — Anesthesia Preprocedure Evaluation (Signed)
Anesthesia Evaluation  Patient identified by MRN, date of birth, ID band Patient awake    Reviewed: Allergy & Precautions, NPO status , Patient's Chart, lab work & pertinent test results  Airway Mallampati: II  TM Distance: >3 FB Neck ROM: Full    Dental no notable dental hx.    Pulmonary neg pulmonary ROS,    Pulmonary exam normal breath sounds clear to auscultation       Cardiovascular hypertension, Normal cardiovascular exam Rhythm:Regular Rate:Normal     Neuro/Psych negative neurological ROS  negative psych ROS   GI/Hepatic negative GI ROS, Neg liver ROS,   Endo/Other  negative endocrine ROSdiabetes  Renal/GU ESRFRenal disease  negative genitourinary   Musculoskeletal negative musculoskeletal ROS (+)   Abdominal   Peds negative pediatric ROS (+)  Hematology negative hematology ROS (+)   Anesthesia Other Findings   Reproductive/Obstetrics negative OB ROS                             Anesthesia Physical Anesthesia Plan  ASA: IV  Anesthesia Plan: General   Post-op Pain Management:    Induction: Intravenous  PONV Risk Score and Plan: 3 and Ondansetron, Dexamethasone and Midazolam  Airway Management Planned: LMA  Additional Equipment:   Intra-op Plan:   Post-operative Plan: Extubation in OR  Informed Consent: I have reviewed the patients History and Physical, chart, labs and discussed the procedure including the risks, benefits and alternatives for the proposed anesthesia with the patient or authorized representative who has indicated his/her understanding and acceptance.     Dental advisory given  Plan Discussed with: CRNA  Anesthesia Plan Comments:         Anesthesia Quick Evaluation

## 2018-12-31 NOTE — Anesthesia Postprocedure Evaluation (Signed)
Anesthesia Post Note  Patient: Colleen [name]  Procedure(s) Performed: INSERTION OF TUNNELED  DIALYSIS CATHETER (N/A Chest) ARTERIOVENOUS (AV) FISTULA CREATION VERSUS INSERTION OF ARTERIOVENOUS GRAFT LEFT ARM (Left Arm Lower)     Anesthesia Post Evaluation  Last Vitals:  Vitals:   12/31/18 1715 12/31/18 1730  BP: 121/72 125/65  Pulse: 84 84  Resp: 18   Temp:  36.6 C  SpO2: 96% 96%    Last Pain:  Vitals:   12/31/18 1730  TempSrc: Oral  PainSc: Asleep                 [name]

## 2018-12-31 NOTE — Progress Notes (Signed)
HD tx completed @ 2130 w/o problem UF goal met Blood rinsed back VSS Report called to Shanon Brow, RN

## 2018-12-31 NOTE — H&P (Addendum)
History and Physical    Colleen [name]:[id number] DOB: March 11, 1961 DOA: 12/30/2018  PCP: [name], DO  Patient coming from: Home  I have personally briefly reviewed patient's old medical records in [place]  Chief Complaint: SOB  HPI: Colleen [name] is a 58 y.o. female with medical history significant of DM2, DVT/PE previously on eliquis, CKD stage 4-5 previously at baseline.  Patient has been having generalized weakness for the past few days as well as worsening peripheral edema.  Went to nephrologist today who drew labs and started her on lasix.  Labs came back with "kidney function at 2%", patient was called by nephrology and told to present to the ED.   ED Course: Creat 17.x up from 4.x previously, BUN 110, K 5.8.  No obvious EKG changes.  Hgb 7.0 was 10.8 on 12/30.  Bicarb 13 AG 14.  EDP giving calcium gluconate and 1 amp of bicarb.  Hospitalist asked to admit.  Nephrology will see patient in AM.   Review of Systems: As per HPI otherwise 10 point review of systems negative.   Past Medical History:  Diagnosis Date  . Chronic kidney disease   . Diabetes ([place])   . DVT (deep venous thrombosis) ([place])   . E-coli UTI   . Gout 06/05/2018  . HLD (hyperlipidemia) 06/05/2018  . PE (pulmonary embolism)   . 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 NO PAST SURGERIES       reports that she has never smoked. She has never used smokeless tobacco. She reports that she does not drink alcohol or use drugs.  No Known Allergies  Family History  Problem Relation Age of Onset  . CAD Neg Hx      Prior to Admission medications   Medication Sig Start Date End Date Taking? Authorizing Provider  amLODipine (NORVASC) 10 MG tablet Take 1 tablet (10 mg total) by mouth daily for 21 days. 12/06/18 12/31/26 Yes Volanda Napoleon, PA-C  carvedilol (COREG) 12.5 MG tablet  Take 1 tablet (12.5 mg total) by mouth 2 (two) times daily with a meal for 21 days. 12/06/18 12/31/26 Yes Volanda Napoleon, PA-C  Insulin Detemir (LEVEMIR) 100 UNIT/ML Pen Inject 8 Units into the skin daily at 10 pm. Patient taking differently: Inject 8 Units into the skin at bedtime.  05/30/18  Yes Roxan Hockey, MD    Physical Exam: Vitals:   12/30/18 2254 12/31/18 0046 12/31/18 0145 12/31/18 0200  BP: (!) 147/72 126/61 140/74 138/77  Pulse: 100 96 99 98  Resp: (!) 22 20  20   Temp: 98.4 F (36.9 C) 98.5 F (36.9 C)    TempSrc: Oral Oral    SpO2: 93% 94% 94% 93%  Weight: 81.2 kg     Height: 5\' 4"  (1.626 m)       Constitutional: NAD, calm, comfortable Eyes: PERRL, lids and conjunctivae normal ENMT: Mucous membranes are moist. Posterior pharynx clear of any exudate or lesions.Normal dentition.  Neck: normal, supple, no masses, no thyromegaly Respiratory: clear to auscultation bilaterally, no wheezing, no crackles. Normal respiratory effort. No accessory muscle use.  Cardiovascular: Regular rate and rhythm, no murmurs / rubs / gallops. No extremity edema. 2+ pedal pulses. No carotid bruits.  Abdomen: no tenderness, no masses palpated. No hepatosplenomegaly. Bowel sounds positive.  Musculoskeletal: no clubbing / cyanosis. No joint deformity upper and  lower extremities. Good ROM, no contractures. Normal muscle tone.  Skin: no rashes, lesions, ulcers. No induration Neurologic: CN 2-12 grossly intact. Sensation intact, DTR normal. Strength 5/5 in all 4.  Psychiatric: Normal judgment and insight. Alert and oriented x 3. Normal mood.    Labs on Admission: I have personally reviewed following labs and imaging studies  CBC: Recent Labs  Lab 12/28/18 1110 12/30/18 2319  WBC  --  11.7*  HGB 8.2* 7.0*  HCT  --  23.7*  MCV  --  77.5*  PLT  --  601   Basic Metabolic Panel: Recent Labs  Lab 12/30/18 2319  NA 138  K 5.8*  CL 111  CO2 13*  GLUCOSE 238*  BUN 110*  CREATININE  17.59*  CALCIUM 7.3*   GFR: Estimated Creatinine Clearance: 3.7 mL/min (A) (by C-G formula based on SCr of 17.59 mg/dL (H)). Liver Function Tests: No results for input(s): AST, ALT, ALKPHOS, BILITOT, PROT, ALBUMIN in the last 168 hours. No results for input(s): LIPASE, AMYLASE in the last 168 hours. No results for input(s): AMMONIA in the last 168 hours. Coagulation Profile: No results for input(s): INR, PROTIME in the last 168 hours. Cardiac Enzymes: No results for input(s): CKTOTAL, CKMB, CKMBINDEX, TROPONINI in the last 168 hours. BNP (last 3 results) No results for input(s): PROBNP in the last 8760 hours. HbA1C: No results for input(s): HGBA1C in the last 72 hours. CBG: No results for input(s): GLUCAP in the last 168 hours. Lipid Profile: No results for input(s): CHOL, HDL, LDLCALC, TRIG, CHOLHDL, LDLDIRECT in the last 72 hours. Thyroid Function Tests: No results for input(s): TSH, T4TOTAL, FREET4, T3FREE, THYROIDAB in the last 72 hours. Anemia Panel: Recent Labs    12/28/18 1106  FERRITIN 630*  TIBC 160*  IRON 25*   Urine analysis:    Component Value Date/Time   COLORURINE STRAW (A) 06/05/2018 0910   APPEARANCEUR HAZY (A) 06/05/2018 0910   LABSPEC 1.016 06/05/2018 0910   PHURINE 6.0 06/05/2018 0910   GLUCOSEU >=500 (A) 06/05/2018 0910   HGBUR SMALL (A) 06/05/2018 0910   BILIRUBINUR NEGATIVE 06/05/2018 0910   KETONESUR NEGATIVE 06/05/2018 0910   PROTEINUR >=300 (A) 06/05/2018 0910   UROBILINOGEN 0.2 09/22/2013 0803   NITRITE NEGATIVE 06/05/2018 0910   LEUKOCYTESUR NEGATIVE 06/05/2018 0910    Radiological Exams on Admission: No results found.  EKG: Independently reviewed.  Assessment/Plan Principal Problem:   CKD (chronic kidney disease) stage 5, GFR less than 15 ml/min (HCC) Active Problems:   DM (diabetes mellitus), type 2, uncontrolled (HCC)   HTN (hypertension)   Acute on chronic kidney failure (HCC)   Uremia   Hyperkalemia, diminished renal  excretion   Metabolic acidosis, NAG, failure of bicarbonate regeneration    1. AKI on CKD, likely progression of CKD stage 4 now to CKD stage 5 - 1. Nephrology to see in AM 2. Will let patient eat diet 3. No access yet 4. Suspect pulm edema causing SOB: checking CXR to confirm 2. Hyperkalemia - 1. Patient getting 1 amp bicarb and 1gm calcium gluconate in ED 2. Repeat BMP in AM 3. NAG metabolic acidosis 1. 1 amp bicarb now 2. 650 bicarb PO TID (cancel this if they decide to start dialysis instead as I suspect they may). 4. HTN - 1. Continue home coreg and norvasc 5. DM2 - 1. Lantus 4u QHS 2. novolog sensitive SSI AC  DVT prophylaxis: SCDs Code Status: Full Family Communication: Family at bedside Disposition Plan: Home after admit Consults  called: Nephrology Dr. Joelyn Oms Admission status: Admit to inpatient  Severity of Illness: The appropriate patient status for this patient is INPATIENT. Inpatient status is judged to be reasonable and necessary in order to provide the required intensity of service to ensure the patient's safety. The patient's presenting symptoms, physical exam findings, and initial radiographic and laboratory data in the context of their chronic comorbidities is felt to place them at high risk for further clinical deterioration. Furthermore, it is not anticipated that the patient will be medically stable for discharge from the hospital within 2 midnights of admission. The following factors support the patient status of inpatient.   " The patient's presenting symptoms include SOB, peripheral edema. " The worrisome physical exam findings include peripheral edema. " The initial radiographic and laboratory data are worrisome because of AKF, creat 17 BUN 110, K 5.8. " The chronic co-morbidities include CKD stage 4, DM2, HTN.   * I certify that at the point of admission it is my clinical judgment that the patient will require inpatient hospital care spanning beyond 2  midnights from the point of admission due to high intensity of service, high risk for further deterioration and high frequency of surveillance required.Etta Quill DO [facility] Pager [phone] Only works nights!  If 7AM-7PM, please contact the primary day team physician taking care of patient  [email or web address] Password [email or web address]  12/31/2018, 2:30 AM

## 2018-12-31 NOTE — Op Note (Signed)
Procedure: Ultrasound-guided insertion of Palidrome catheter, first stage basilic vein fistula left arm  Preoperative diagnosis: End-stage renal disease  Postoperative diagnosis: Same  Anesthesia: General  Operative findings: 23 cm Palindrome catheter right internal jugular vein  Operative details: After obtaining informed consent, the patient was taken to the operating room. The patient was placed in supine position on the operating room table. After adequate sedation the patient's entire neck and chest were prepped and draped in usual sterile fashion. The patient was placed in Trendelenburg position. Ultrasound was used to identify the patient's right internal jugular vein. This had normal compressibility and respiratory variation. Local anesthesia was infiltrated over the right jugular vein.  Using ultrasound guidance, the right internal jugular vein was successfully cannulated.  A 0.035 J-tipped guidewire was threaded into the right internal jugular vein and into the superior vena cava followed by the inferior vena cava under fluoroscopic guidance.   Next sequential 12 and 14 dilators were placed over the guidewire into the right atrium.  A 16 French dilator with a peel-away sheath was then placed over the guidewire into the right atrium.   The guidewire and dilator were removed. A 23 cm Palindrome catheter was then placed through the peel away sheath into the right atrium.  The catheter was then tunneled subcutaneously, cut to length, and the hub attached. The catheter was noted to flush and draw easily. The catheter was inspected under fluoroscopy and found with its tip to be in the right atrium without any kinks throughout its course. The catheter was sutured to the skin with nylon sutures. The neck insertion site was closed with Vicryl stitch. The catheter was then loaded with concentrated Heparin solution. A dry sterile dressing was applied.  Next, the patient's entire left upper extremity was  prepped and draped in the usual sterile fashion. Next ultrasound was used to identify the left basilic vein. A transverse incision was made just above the antecubital crease in order to expose the cephalic vein.  It was fairly small diameter about 2.5 mm and also with several recent needle sticks.  I then exposed the adjacent basilic vein. The vein was of good quality approximately 3 mm in diameter.  Care was taken to try to not injure any sensory nerves and all the motor nerves were identified and protected. The vein was dissected free circumferentially and small side branches ligated and divided between silk ties or clips. Next the brachial artery was exposed by deepening the basilic vein harvest incision just above the antecubital crease. The artery was approximately 2.5-3 mm in diameter with some spasm. This was dissected free circumferentially. Vessel loops were placed around it. There was some spasm within the artery after dissecting it free. Next the distal basilic vein was ligated with a 2 0 silk tie and the vein transected.  The patient was given 5000 units of intravenous heparin. Vessel loops were used to control the artery proximally and distally. A longitudinal arteriotomy was made with an 11 blade.  The vein was cut to length and sewn end of vein to side of artery using a running 7-0 Prolene suture. Just prior to completion of the anastomosis, it was forebled backbled and thoroughly flushed. The anastomosis was secured and the Vesseloops were released.  There was a palpable thrill in the proximal aspect of the fistula.  At this point all subcutaneous tissues were reapproximated using running 3-0 Vicryl suture. All skin incisions were closed with running 4  0 Vicryl subcuticular stitch. Dermabond was  applied to all incisions.   The patient tolerated procedure well and there were no complications. Instrument sponge and needle counts correct in the case. The patient was taken to the recovery room in  stable condition. Chest x-ray will be obtained in the recovery room.  [name], MD [facility] [place] Office: [phone] Pager: [phone]

## 2018-12-31 NOTE — ED Provider Notes (Signed)
[facility] EMERGENCY DEPARTMENT Provider Note   CSN: [id number] Arrival date & time: 12/30/18  2235     History   Chief Complaint Chief Complaint  Patient presents with  . Shortness of Breath    HPI Colleen [name] is a 58 y.o. female.  HPI  59 year old female with history of diabetes, DVT/PE on Eliquis, CKD comes in with chief complaint of abnormal labs. Patient reports that for the past few days she has been feeling weak and having worsening swelling in her legs.  She saw her renal doctor yesterday, and was started on Lasix.  Basic labs were ordered, which showed abnormal creatinine and she was advised to come to the ER.  Review of system is negative for any confusion, seizures, palpitations.  She denies any recent illnesses.  Patient has been having daily nausea with emesis x1.  She denies any bloody stools.  No new medications.   Past Medical History:  Diagnosis Date  . Chronic kidney disease   . Diabetes ([place])   . DVT (deep venous thrombosis) ([place])   . E-coli UTI   . Gout 06/05/2018  . HLD (hyperlipidemia) 06/05/2018  . PE (pulmonary embolism)   . 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 Acute on chronic kidney failure ([place]) 12/31/2018  . Long term (current) use of anticoagulants 09/07/2018  . DVT (deep venous thrombosis) ([place]) 08/24/2018  . Blindness of left eye 08/24/2018  . Acute kidney injury superimposed on CKD ([place]) 06/06/2018  . Left knee pain 06/06/2018  . Anemia due to chronic kidney disease 06/06/2018  . Hyponatremia 06/06/2018  . HLD (hyperlipidemia) 06/05/2018  . Hyperglycemia 06/05/2018  . Gout 06/05/2018  . Swelling of lower extremity 05/28/2018  . CKD (chronic kidney disease) stage 5, GFR less than 15 ml/min (HCC) 05/28/2018  . Hypochromic microcytic anemia 05/28/2018  . Lower extremity edema 05/28/2018  . HTN  (hypertension) 10/06/2013  . Pulmonary embolism ([place]) 10/06/2013  . Congestive dilated cardiomyopathy ([place]) 10/05/2013  . Carotid bruit 10/05/2013  . DM (diabetes mellitus), type 2, uncontrolled ([place]) 10/04/2013  . Acute renal insufficiency 09/27/2013  . Septic shock(785.52) 09/25/2013  . Bacteremia 09/25/2013    Past Surgical History:  Procedure Laterality Date  . NO PAST SURGERIES       OB History   No obstetric history on file.      Home Medications    Prior to Admission medications   Medication Sig Start Date End Date Taking? Authorizing Provider  acetaminophen (TYLENOL) 325 MG tablet Take 2 tablets (650 mg total) by mouth every 6 (six) hours as needed for mild pain (or Fever >/= 101). 05/30/18   Emokpae, Courage, MD  amLODipine (NORVASC) 10 MG tablet Take 1 tablet (10 mg total) by mouth daily for 21 days. 12/06/18 12/27/18  Volanda Napoleon, PA-C  atorvastatin (LIPITOR) 40 MG tablet Take 1 tablet (40 mg total) by mouth daily at 6 PM. 05/30/18   Emokpae, Courage, MD  carvedilol (COREG) 12.5 MG tablet Take 1 tablet (12.5 mg total) by mouth 2 (two) times daily with a meal for 21 days. 12/06/18 12/27/18  Volanda Napoleon, PA-C  ELIQUIS STARTER PACK (ELIQUIS STARTER PACK) 5 MG TABS Take as directed on package: start with two-5mg  tablets twice daily for 7 days. On day 8, switch to one-5mg  tablet twice daily. 07/06/18  Drenda Freeze, MD  Insulin Detemir (LEVEMIR) 100 UNIT/ML Pen Inject 8 Units into the skin daily at 10 pm. Patient taking differently: Inject 8 Units into the skin daily as needed.  05/30/18   Roxan Hockey, MD  saxagliptin HCl (ONGLYZA) 5 MG TABS tablet Take 0.5 tablets (2.5 mg total) by mouth daily. For Diabetes Patient taking differently: Take 5 mg by mouth daily. For Diabetes 06/07/18   Oretha Milch D, MD  sodium bicarbonate 650 MG tablet Take 1 tablet (650 mg total) by mouth 3 (three) times daily. Patient not taking: Reported on 07/06/2018 06/07/18   Desiree Hane, MD    Family History Family History  Problem Relation Age of Onset  . CAD Neg Hx     Social History Social History   Tobacco Use  . Smoking status: Never Smoker  . Smokeless tobacco: Never Used  Substance Use Topics  . Alcohol use: No  . Drug use: No     Allergies   Patient has no known allergies.   Review of Systems Review of Systems  Constitutional: Positive for activity change and fatigue.  Gastrointestinal: Positive for nausea.  Neurological: Positive for weakness.  Hematological: Bruises/bleeds easily.  All other systems reviewed and are negative.    Physical Exam Updated Vital Signs BP 138/77   Pulse 98   Temp 98.5 F (36.9 C) (Oral)   Resp 20   Ht 5\' 4"  (1.626 m)   Wt 81.2 kg   LMP 12/08/2013   SpO2 93%   BMI 30.73 kg/m   Physical Exam Vitals signs and nursing note reviewed.  Constitutional:      Appearance: She is well-developed.  HENT:     Head: Normocephalic and atraumatic.  Neck:     Musculoskeletal: Neck supple.  Cardiovascular:     Rate and Rhythm: Normal rate.  Pulmonary:     Effort: Pulmonary effort is normal.  Abdominal:     General: Bowel sounds are normal.  Skin:    General: Skin is warm and dry.  Neurological:     Mental Status: She is alert and oriented to person, place, and time.      ED Treatments / Results  Labs (all labs ordered are listed, but only abnormal results are displayed) Labs Reviewed  BASIC METABOLIC PANEL - Abnormal; Notable for the following components:      Result Value   Potassium 5.8 (*)    CO2 13 (*)    Glucose, Bld 238 (*)    BUN 110 (*)    Creatinine, Ser 17.59 (*)    Calcium 7.3 (*)    GFR calc non Af Amer 2 (*)    GFR calc Af Amer 2 (*)    All other components within normal limits  CBC - Abnormal; Notable for the following components:   WBC 11.7 (*)    RBC 3.06 (*)    Hemoglobin 7.0 (*)    HCT 23.7 (*)    MCV 77.5 (*)    MCH 22.9 (*)    MCHC 29.5 (*)    RDW 15.9 (*)     All other components within normal limits  BASIC METABOLIC PANEL    EKG EKG Interpretation  Date/Time:  Thursday December 30 2018 22:59:09 EST Ventricular Rate:  97 PR Interval:  148 QRS Duration: 64 QT Interval:  346 QTC Calculation: 439 R Axis:   80 Text Interpretation:  Normal sinus rhythm Low voltage QRS Cannot rule out Anterior infarct , age undetermined  Abnormal ECG No acute changes No significant change since last tracing Confirmed by Varney Biles [phone]) on 12/31/2018 1:50:41 AM   Radiology No results found.  Procedures .Critical Care Performed by: Varney Biles, MD Authorized by: Varney Biles, MD   Critical care provider statement:    Critical care time (minutes):  32   Critical care time was exclusive of:  Separately billable procedures and treating other patients   Critical care was necessary to treat or prevent imminent or life-threatening deterioration of the following conditions:  Metabolic crisis   Critical care was time spent personally by me on the following activities:  Discussions with consultants, evaluation of patient's response to treatment, examination of patient, ordering and performing treatments and interventions, ordering and review of laboratory studies, ordering and review of radiographic studies, pulse oximetry, re-evaluation of patient's condition, obtaining history from patient or surrogate and review of old charts   (including critical care time)  Medications Ordered in ED Medications  sodium bicarbonate injection 50 mEq (has no administration in time range)  calcium gluconate 1 g/ 50 mL sodium chloride IVPB (has no administration in time range)  acetaminophen (TYLENOL) tablet 650 mg (has no administration in time range)    Or  acetaminophen (TYLENOL) suppository 650 mg (has no administration in time range)  ondansetron (ZOFRAN) tablet 4 mg (has no administration in time range)    Or  ondansetron (ZOFRAN) injection 4 mg (has no  administration in time range)  insulin aspart (novoLOG) injection 0-9 Units (has no administration in time range)     Initial Impression / Assessment and Plan / ED Course  I have reviewed the triage vital signs and the nursing notes.  Pertinent labs & imaging results that were available during my care of the patient were reviewed by me and considered in my medical decision making (see chart for details).     58 year old female with history of DVT-PE on Eliquis, CKD, diabetes comes in with chief complaint of abnormal creatinine.  She has chronic kidney disease with baseline creatinine in the upper 3, lower 4 range.  Today she was noted to have creatinine over 17 and a BUN over 100.  It appears to me that her weakness and nausea are likely related to her uremia.  It is unclear as to what led to the deterioration of her kidney function, but it seems like a chronic process and not an acute process.  Patient's K is 5.7, hemoglobin is 7 -both likely because of her renal failure.  She is on Eliquis for PE that will likely need to be discontinued.  No bloody stools right now.  I spoke with the nephrologist on-call.  They will see the patient tomorrow morning.  Medicine to admit.  Final Clinical Impressions(s) / ED Diagnoses   Final diagnoses:  Acute renal failure superimposed on stage 5 chronic kidney disease, not on chronic dialysis, unspecified acute renal failure type [facility])  Hyperkalemia    ED Discharge Orders    None       Varney Biles, MD 12/31/18 [phone]

## 2018-12-31 NOTE — ED Notes (Signed)
Pt reports getting a phone call at 1115 on 12/30/2018 from her kidney doctor and they told her that her kidney function was 2%. Pt also reports SOB for 2 weeks.

## 2018-12-31 NOTE — Progress Notes (Signed)
Bilateral upper extremity vein mapping completed.

## 2018-12-31 NOTE — Transfer of Care (Signed)
Immediate Anesthesia Transfer of Care Note  Patient: Colleen [name]  Procedure(s) Performed: INSERTION OF TUNNELED  DIALYSIS CATHETER (N/A Chest) ARTERIOVENOUS (AV) FISTULA CREATION VERSUS INSERTION OF ARTERIOVENOUS GRAFT LEFT ARM (Left Arm Lower)  Patient Location: PACU  Anesthesia Type:General  Level of Consciousness: awake and patient cooperative  Airway & Oxygen Therapy: Patient Spontanous Breathing and Patient connected to nasal cannula oxygen  Post-op Assessment: Report given to RN, Post -op Vital signs reviewed and stable and Patient moving all extremities  Post vital signs: Reviewed and stable  Last Vitals:  Vitals Value Taken Time  BP 119/62 12/31/2018  4:37 PM  Temp    Pulse 82 12/31/2018  4:38 PM  Resp 15 12/31/2018  4:38 PM  SpO2 94 % 12/31/2018  4:38 PM  Vitals shown include unvalidated device data.  Last Pain:  Vitals:   12/31/18 0538  TempSrc: Oral  PainSc:          Complications: No apparent anesthesia complications

## 2018-12-31 NOTE — Consult Note (Addendum)
[name] NOTE   MRN : [id number]  Reason for Consult: ESRD Referring Physician: Dr. Alfonso Ellis  History of Present Illness: 58 y/o female with progressive CKD, now ESRD.We have been asked to provide access and [facility] urgently.  She reported to the ED with CC of fatigue, edema in all 4 ext.    Past medical history includes right sides facial palsy since birth, DM, and HTN.  Hyperkalemia 5.8, Cr 17.3.  Renal is managing correction of K+ with bicarb, insulin and calcium gluconate.  She is right hand dominant and in agreement to proceed with access.     Current Facility-Administered Medications  Medication Dose Route Frequency Provider Last Rate Last Dose  . 0.9 %  sodium chloride infusion  100 mL Intravenous PRN Pearson Grippe B, MD      . 0.9 %  sodium chloride infusion  100 mL Intravenous PRN Pearson Grippe B, MD      . acetaminophen (TYLENOL) tablet 650 mg  650 mg Oral Q6H PRN Etta Quill, DO       Or  . acetaminophen (TYLENOL) suppository 650 mg  650 mg Rectal Q6H PRN Etta Quill, DO      . alteplase (CATHFLO ACTIVASE) injection 2 mg  2 mg Intracatheter Once PRN Pearson Grippe B, MD      . amLODipine (NORVASC) tablet 10 mg  10 mg Oral Daily Jennette Kettle M, DO      . calcium gluconate 1 g/ 50 mL sodium chloride IVPB  1 g Intravenous Once Varney Biles, MD 50 mL/hr at 12/31/18 0706 1,000 mg at 12/31/18 0706  . carvedilol (COREG) tablet 12.5 mg  12.5 mg Oral BID WC Etta Quill, DO      . Chlorhexidine Gluconate Cloth 2 % PADS 6 each  6 each Topical Q0600 [name], MD   6 each at 12/31/18 [phone]  . Darbepoetin Alfa (ARANESP) injection 60 mcg  60 mcg Intravenous Q Fri-HD Pearson Grippe B, MD      . heparin injection 1,000 Units  1,000 Units Dialysis PRN Pearson Grippe B, MD      . insulin aspart (novoLOG) injection 0-9 Units  0-9 Units Subcutaneous TID WC Etta Quill, DO   1 Units at 12/31/18 [phone]  . insulin detemir (LEVEMIR)  injection 4 Units  4 Units Subcutaneous QHS Alcario Drought, Jared M, DO      . lidocaine (PF) (XYLOCAINE) 1 % injection 5 mL  5 mL Intradermal PRN Pearson Grippe B, MD      . lidocaine-prilocaine (EMLA) cream 1 application  1 application Topical PRN [name], MD      . ondansetron [facility]) tablet 4 mg  4 mg Oral Q6H PRN Etta Quill, DO       Or  . ondansetron [facility]) injection 4 mg  4 mg Intravenous Q6H PRN Etta Quill, DO      . pentafluoroprop-tetrafluoroeth (GEBAUERS) aerosol 1 application  1 application Topical PRN Pearson Grippe B, MD      . sodium bicarbonate tablet 650 mg  650 mg Oral TID Etta Quill, DO        Pt meds include: Statin :No Betablocker: Yes ASA: No Other anticoagulants/antiplatelets: none  Past Medical History:  Diagnosis Date  . Chronic kidney disease   . Diabetes ([place])   . DVT (deep venous thrombosis) ([place])   . E-coli UTI   . Gout 06/05/2018  . HLD (hyperlipidemia) 06/05/2018  .  PE (pulmonary embolism)   . 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 NO PAST SURGERIES      Social History Social History   Tobacco Use  . Smoking status: Never Smoker  . Smokeless tobacco: Never Used  Substance Use Topics  . Alcohol use: No  . Drug use: No    Family History Family History  Problem Relation Age of Onset  . CAD Neg Hx     No Known Allergies   REVIEW OF SYSTEMS  General: [ ]  Weight loss, [ ]  Fever, [ ]  chills  Neurologic: [ ]  Dizziness, [ ]  Blackouts, [ ]  Seizure [ ]  Stroke, [ ]  "Mini stroke", [ ]  Slurred speech, [ ]  Temporary blindness; [ ]  weakness in arms or legs, [ ]  Hoarseness [ ]  Dysphagia Cardiac: [ ]  Chest pain/pressure, [x ] Shortness of breath at rest [ ]  Shortness of breath with exertion, [ ]  Atrial fibrillation or irregular heartbeat  Vascular: [ ]  Pain in legs with walking, [ ]  Pain in legs at rest, [ ]  Pain in legs at  night,  [ ]  Non-healing ulcer, [ ]  Blood clot in vein/DVT,   Pulmonary: [ ]  Home oxygen, [ ]  Productive cough, [ ]  Coughing up blood, [ ]  Asthma,  [ ]  Wheezing [ ]  COPD Musculoskeletal:  [ ]  Arthritis, [ ]  Low back pain, [x ] Joint pain Hematologic: [ ]  Easy Bruising, [ ]  Anemia; [ ]  Hepatitis Gastrointestinal: [ ]  Blood in stool, [ ]  Gastroesophageal Reflux/heartburn, Urinary: [x ] chronic Kidney disease, [ ]  on HD - [ ]  MWF or [ ]  TTHS, [ ]  Burning with urination, [ ]  Difficulty urinating Skin: [ ]  Rashes, [ ]  Wounds Psychological: [ ]  Anxiety, [ ]  Depression  Physical Examination Vitals:   12/31/18 0315 12/31/18 0330 12/31/18 0425 12/31/18 0538  BP:  (!) 141/73 138/71   Pulse: 100 98 96   Resp:      Temp:    99.4 F (37.4 C)  TempSrc:    Oral  SpO2: 94% 92% 94%   Weight:      Height:       Body mass index is 30.73 kg/m.  General:  WDWN in NAD Gait: Normal,  HENT: WNL, normocephalic with right facial droop baseline since birth Eyes: Pupils equal Pulmonary: normal non-labored breathing with decresed breath sounds at lung bases , without Rales, rhonchi,  wheezing Cardiac: RRR, without  Murmurs, rubs or gallops; Abdomen: soft, NT, no masses Skin: no rashes, ulcers noted;  no Gangrene , no cellulitis; no open wounds;   Vascular Exam/Pulses:significant edema faint palpable radial pulses, sensation intact, with active range of motion B UE   Musculoskeletal: no muscle wasting or atrophy; sever B UE/LE edema  Neurologic: A&O X 3; Appropriate Affect ;  SENSATION: normal; MOTOR FUNCTION: intact grossly Symmetric all 4 ext Speech is fluent/normal   Significant Diagnostic Studies: CBC Lab Results  Component Value Date   WBC 11.7 (H) 12/30/2018   HGB 7.0 (L) 12/30/2018   HCT 23.7 (L) 12/30/2018   MCV 77.5 (L) 12/30/2018   PLT 281 12/30/2018    BMET    Component Value Date/Time   NA 138 12/31/2018 0341   K 5.9 (H) 12/31/2018 0341   CL 111 12/31/2018 0341   CO2 12  (L) 12/31/2018 0341   GLUCOSE 164 (H) 12/31/2018 0341  BUN 112 (H) 12/31/2018 0341   CREATININE 17.13 (H) 12/31/2018 0341   CREATININE 4.83 (HH) 08/24/2018 1120   CALCIUM 7.5 (L) 12/31/2018 0341   GFRNONAA 2 (L) 12/31/2018 0341   GFRNONAA 9 (L) 08/24/2018 1120   GFRAA 2 (L) 12/31/2018 0341   GFRAA 11 (L) 08/24/2018 1120   Estimated Creatinine Clearance: 3.8 mL/min (A) (by C-G formula based on SCr of 17.13 mg/dL (H)).  COAG Lab Results  Component Value Date   INR 1.06 12/31/2018   INR 1.62 (H) 09/23/2013   INR 1.61 (H) 09/22/2013     Non-Invasive Vascular Imaging:  Pending vein mapping   ASSESSMENT/PLAN:  ESRD with fluid over load Hyperkalemia She is right hand dominant, so we will plan left UE av fistula verse graft and TDC placement today.  NPO.   Colleen [name] 12/31/2018 7:15 AM   I have independently interviewed and examined patient and agree with PA assessment and plan above.  Plan will be for tunneled dialysis catheter and left arm access today.  Does have marginal vein which we will evaluate at the time of surgery.  She will otherwise need a graft.  Colleen Schweikert C. Donzetta Matters, MD [facility] [place] Office: [phone] Pager: [phone]

## 2018-12-31 NOTE — Interval H&P Note (Signed)
History and Physical Interval Note:  12/31/2018 1:54 PM  Colleen [name]  has presented today for surgery, with the diagnosis of END STAGE RENAL DISEASE FOR HEMODIALYSIS ACCESS  The various methods of treatment have been discussed with the patient and family. After consideration of risks, benefits and other options for treatment, the patient has consented to  Procedure(s): INSERTION OF TUNNELED  DIALYSIS CATHETER (N/A) ARTERIOVENOUS (AV) FISTULA CREATION VERSUS INSERTION OF ARTERIOVENOUS GRAFT LEFT ARM (Left) as a surgical intervention .  The patient's history has been reviewed, patient examined, no change in status, stable for surgery.  I have reviewed the patient's chart and labs.  Questions were answered to the patient's satisfaction.     [name]

## 2018-12-31 NOTE — Progress Notes (Signed)
PROGRESS NOTE    Colleen [name]  TGG:[id number] DOB: 1961/09/24 DOA: 12/30/2018 PCP: [name], DO   Brief Narrative:  HPI On 12/31/2018 by Dr. Jennette Kettle Colleen [name] is a 58 y.o. female with medical history significant of DM2, DVT/PE previously on eliquis, CKD stage 4-5 previously at baseline.  Patient has been having generalized weakness for the past few days as well as worsening peripheral edema.  Went to nephrologist today who drew labs and started her on lasix.  Labs came back with "kidney function at 2%", patient was called by nephrology and told to present to the ED.  Assessment & Plan   Patient admitted earlier today by Dr. Jennette Kettle.  Please see H&P for details.  Progressive nephrotic diabetic kidney disease with uremia-ESRD -Patient at baseline has chronic kidney disease, stage IV-V, however now has progressed to end-stage renal disease -Patient endorsed 1 month of progressive fatigue, anorexia, nausea and vomiting with dyschezia, progressive edema in her extremities and shortness of breath. -Seems that she was seen by Dr. Hollie Salk on 12/29/2018, creatinine was found to be 16 and attempts were made for vascular evaluation however patient was unable to attend.  She was directed to come to the emergency department. -Nephrology consulted and appreciated.   -Vascular surgery consulted and appreciated for access placement-cleared to occur today -Status post bilateral upper extremity vein mapping  Hyperkalemia -She was given bicarb and calcium gluconate in the emergency department -Continue to monitor BMP  Anion gap metabolic acidosis -Secondary to the above -Continue bicarb replacement and monitor  Essential hypertension -Continue Coreg and Norvasc  Diabetes mellitus, type II -Continue Lantus, insulin sliding scale with CBG monitoring  DVT Prophylaxis  SCDs  Code Status: Full  Family Communication: Mother at bedside  Disposition Plan: Admitted.  Given  that patient's renal disease has progressed, she is now end-stage renal disease and dialysis to begin.  Suspect patient will require several day inpatient stay.  Disposition likely to home.  Consultants Nephrology Vascular surgery  Procedures  bilateral upper extremity vein mapping  Antibiotics   Anti-infectives (From admission, onward)   Start     Dose/Rate Route Frequency Ordered Stop   12/31/18 0745  cefUROXime (ZINACEF) 1.5 g in sodium chloride 0.9 % 100 mL IVPB     1.5 g 200 mL/hr over 30 Minutes Intravenous To [facility] 12/31/18 0737 01/01/19 0745      Subjective:   [name] seen and examined today.  No complaints of chest pain.  Mild shortness of breath.  Complains of abdominal pain, dizziness or headache.  Mother At bedside with many questions.  Objective:   Vitals:   12/31/18 0315 12/31/18 0330 12/31/18 0425 12/31/18 0538  BP:  (!) 141/73 138/71   Pulse: 100 98 96   Resp:      Temp:    99.4 F (37.4 C)  TempSrc:    Oral  SpO2: 94% 92% 94%   Weight:      Height:       No intake or output data in the 24 hours ending 12/31/18 1106 Filed Weights   12/30/18 2254  Weight: 81.2 kg   Admitted earlier today- no PE.  Data Reviewed: I have personally reviewed following labs and imaging studies  CBC: Recent Labs  Lab 12/28/18 1110 12/30/18 2319  WBC  --  11.7*  HGB 8.2* 7.0*  HCT  --  23.7*  MCV  --  77.5*  PLT  --  703   Basic Metabolic  Panel: Recent Labs  Lab 12/30/18 2319 12/31/18 0341  NA 138 138  K 5.8* 5.9*  CL 111 111  CO2 13* 12*  GLUCOSE 238* 164*  BUN 110* 112*  CREATININE 17.59* 17.13*  CALCIUM 7.3* 7.5*  PHOS  --  >30.0*   GFR: Estimated Creatinine Clearance: 3.8 mL/min (A) (by C-G formula based on SCr of 17.13 mg/dL (H)). Liver Function Tests: No results for input(s): AST, ALT, ALKPHOS, BILITOT, PROT, ALBUMIN in the last 168 hours. No results for input(s): LIPASE, AMYLASE in the last 168 hours. No results for  input(s): AMMONIA in the last 168 hours. Coagulation Profile: Recent Labs  Lab 12/31/18 0341  INR 1.06   Cardiac Enzymes: No results for input(s): CKTOTAL, CKMB, CKMBINDEX, TROPONINI in the last 168 hours. BNP (last 3 results) No results for input(s): PROBNP in the last 8760 hours. HbA1C: No results for input(s): HGBA1C in the last 72 hours. CBG: Recent Labs  Lab 12/31/18 0424 12/31/18 0633  GLUCAP 145* 143*   Lipid Profile: No results for input(s): CHOL, HDL, LDLCALC, TRIG, CHOLHDL, LDLDIRECT in the last 72 hours. Thyroid Function Tests: No results for input(s): TSH, T4TOTAL, FREET4, T3FREE, THYROIDAB in the last 72 hours. Anemia Panel: Recent Labs    12/28/18 1106  FERRITIN 630*  TIBC 160*  IRON 25*   Urine analysis:    Component Value Date/Time   COLORURINE STRAW (A) 06/05/2018 0910   APPEARANCEUR HAZY (A) 06/05/2018 0910   LABSPEC 1.016 06/05/2018 0910   PHURINE 6.0 06/05/2018 0910   GLUCOSEU >=500 (A) 06/05/2018 0910   HGBUR SMALL (A) 06/05/2018 0910   BILIRUBINUR NEGATIVE 06/05/2018 0910   KETONESUR NEGATIVE 06/05/2018 0910   PROTEINUR >=300 (A) 06/05/2018 0910   UROBILINOGEN 0.2 09/22/2013 0803   NITRITE NEGATIVE 06/05/2018 0910   LEUKOCYTESUR NEGATIVE 06/05/2018 0910   Sepsis Labs: @LABRCNTIP (procalcitonin:4,lacticidven:4)  ) Recent Results (from the past 240 hour(s))  MRSA PCR Screening     Status: None   Collection Time: 12/31/18  4:45 AM  Result Value Ref Range Status   MRSA by PCR NEGATIVE NEGATIVE Final    Comment:        The GeneXpert MRSA Assay (FDA approved for NASAL specimens only), is one component of a comprehensive MRSA colonization surveillance program. It is not intended to diagnose MRSA infection nor to guide or monitor treatment for MRSA infections. Performed at [facility], [place] [address], [place], [place] [address]       Radiology Studies: Dg Chest 2 View  Result Date: 12/31/2018 CLINICAL DATA:  Dyspnea with  leg swelling. EXAM: CHEST - 2 VIEW COMPARISON:  06/05/2018 FINDINGS: Stable cardiomegaly with aortic atherosclerosis. New bilateral small pleural effusions obscuring the diaphragms are identified with interstitial edema. No acute osseous abnormality. IMPRESSION: 1. Cardiomegaly with aortic atherosclerosis. 2. New bilateral small pleural effusions with interstitial edema consistent with CHF. Electronically Signed   By: Ashley Royalty M.D.   On: 12/31/2018 02:36   Vas [name] Upper Ext Vein Mapping (pre-op Avf)  Result Date: 12/31/2018 UPPER EXTREMITY VEIN MAPPING  Indications: Pre-access. History: ESRD.  Performing Technologist: Toma Copier RVS  Examination Guidelines: A complete evaluation includes B-mode imaging, spectral Doppler, color Doppler, and power Doppler as needed of all accessible portions of each vessel. Bilateral testing is considered an integral part of a complete examination. Limited examinations for reoccurring indications may be performed as noted. +-----------------+-------------+----------+------------+ Right Cephalic   Diameter (cm)Depth (cm)  Findings   +-----------------+-------------+----------+------------+ Shoulder  0.19        0.25                +-----------------+-------------+----------+------------+ Prox upper arm       0.30        0.24                +-----------------+-------------+----------+------------+ Mid upper arm        0.23        0.16                +-----------------+-------------+----------+------------+ Dist upper arm       0.28        0.19                +-----------------+-------------+----------+------------+ Antecubital fossa    0.35        0.23   Moves medial +-----------------+-------------+----------+------------+ Prox forearm         0.33        0.43    branching   +-----------------+-------------+----------+------------+ Mid forearm          0.35        0.36                 +-----------------+-------------+----------+------------+ Wrist                0.34        0.28                +-----------------+-------------+----------+------------+ +-----------------+-------------+----------+---------------+ Right Basilic    Diameter (cm)Depth (cm)   Findings     +-----------------+-------------+----------+---------------+ Mid upper arm        0.53        1.32   Enters brachial +-----------------+-------------+----------+---------------+ Dist upper arm       0.54        1.13                   +-----------------+-------------+----------+---------------+ Antecubital fossa    0.37        0.69                   +-----------------+-------------+----------+---------------+ Prox forearm         0.30        0.45                   +-----------------+-------------+----------+---------------+ Mid forearm          0.29        0.31                   +-----------------+-------------+----------+---------------+ Wrist                0.16        0.27                   +-----------------+-------------+----------+---------------+ +-----------------+-------------+----------+--------+ Left Cephalic    Diameter (cm)Depth (cm)Findings +-----------------+-------------+----------+--------+ Shoulder             0.26        0.35            +-----------------+-------------+----------+--------+ Prox upper arm       0.26        0.55            +-----------------+-------------+----------+--------+ Mid upper arm        0.26        0.23            +-----------------+-------------+----------+--------+ Dist upper arm       0.28  0.15            +-----------------+-------------+----------+--------+ Antecubital fossa    0.23        0.12            +-----------------+-------------+----------+--------+ Prox forearm         0.43        0.46            +-----------------+-------------+----------+--------+ Mid forearm          0.37        0.45             +-----------------+-------------+----------+--------+ Wrist                0.42        0.27            +-----------------+-------------+----------+--------+ +-----------------+-------------+----------+---------------+ Left Basilic     Diameter (cm)Depth (cm)   Findings     +-----------------+-------------+----------+---------------+ Mid upper arm        0.50        1.21   enters brachial +-----------------+-------------+----------+---------------+ Dist upper arm       0.46        1.29                   +-----------------+-------------+----------+---------------+ Antecubital fossa    0.28        0.43                   +-----------------+-------------+----------+---------------+ Prox forearm         0.30        0.32                   +-----------------+-------------+----------+---------------+ Mid forearm          0.26        0.25                   +-----------------+-------------+----------+---------------+ Wrist                0.26        0.48                   +-----------------+-------------+----------+---------------+ *See table(s) above for measurements and observations.  Diagnosing physician:    Preliminary      Scheduled Meds: . amLODipine  10 mg Oral Daily  . carvedilol  12.5 mg Oral BID WC  . Chlorhexidine Gluconate Cloth  6 each Topical Q0600  . darbepoetin (ARANESP) injection - DIALYSIS  60 mcg Intravenous Q Fri-HD  . insulin aspart  0-9 Units Subcutaneous TID WC  . insulin detemir  4 Units Subcutaneous QHS  . sodium bicarbonate  650 mg Oral TID   Continuous Infusions: . sodium chloride    . sodium chloride    . cefUROXime (ZINACEF)  IV       LOS: 0 days   Time Spent in minutes   30 minutes  Jamarri Vuncannon D.O. on 12/31/2018 at 11:06 AM  Between 7am to 7pm - Please see pager noted on [email or web address]  After 7pm go to [email or web address]  And look for the night coverage person covering for me after hours  [facility] Office   [phone]

## 2019-01-01 ENCOUNTER — Encounter (HOSPITAL_COMMUNITY): Payer: Self-pay | Admitting: Vascular Surgery

## 2019-01-01 LAB — RENAL FUNCTION PANEL
Albumin: 1.3 g/dL — ABNORMAL LOW (ref 3.5–5.0)
Anion gap: 16 — ABNORMAL HIGH (ref 5–15)
BUN: 77 mg/dL — ABNORMAL HIGH (ref 6–20)
CO2: 16 mmol/L — ABNORMAL LOW (ref 22–32)
Calcium: 7.6 mg/dL — ABNORMAL LOW (ref 8.9–10.3)
Chloride: 106 mmol/L (ref 98–111)
Creatinine, Ser: 12.6 mg/dL — ABNORMAL HIGH (ref 0.44–1.00)
GFR calc Af Amer: 3 mL/min — ABNORMAL LOW (ref >60)
GFR calc non Af Amer: 3 mL/min — ABNORMAL LOW (ref >60)
Glucose, Bld: 344 mg/dL — ABNORMAL HIGH (ref 70–99)
Phosphorus: 9.9 mg/dL — ABNORMAL HIGH (ref 2.5–4.6)
Potassium: 5 mmol/L (ref 3.5–5.1)
Sodium: 138 mmol/L (ref 135–145)

## 2019-01-01 LAB — HEPATITIS B SURFACE ANTIBODY,QUALITATIVE: Hep B S Ab: NONREACTIVE

## 2019-01-01 LAB — BPAM RBC
Blood Product Expiration Date: 202002252359
ISSUE DATE / TIME: 202001242230
Unit Type and Rh: 5100

## 2019-01-01 LAB — CBC
HCT: 25.8 % — ABNORMAL LOW (ref 36.0–46.0)
Hemoglobin: 8 g/dL — ABNORMAL LOW (ref 12.0–15.0)
MCH: 24 pg — ABNORMAL LOW (ref 26.0–34.0)
MCHC: 31 g/dL (ref 30.0–36.0)
MCV: 77.5 fL — ABNORMAL LOW (ref 80.0–100.0)
Platelets: 291 10*3/uL (ref 150–400)
RBC: 3.33 MIL/uL — ABNORMAL LOW (ref 3.87–5.11)
RDW: 16.2 % — ABNORMAL HIGH (ref 11.5–15.5)
WBC: 9.3 10*3/uL (ref 4.0–10.5)
nRBC: 0 % (ref 0.0–0.2)

## 2019-01-01 LAB — TYPE AND SCREEN
ABO/RH(D): O POS
Antibody Screen: NEGATIVE
Unit division: 0

## 2019-01-01 LAB — HEPATITIS B CORE ANTIBODY, TOTAL: Hep B Core Total Ab: NEGATIVE

## 2019-01-01 LAB — GLUCOSE, CAPILLARY
Glucose-Capillary: 326 mg/dL — ABNORMAL HIGH (ref 70–99)
Glucose-Capillary: 170 mg/dL — ABNORMAL HIGH (ref 70–99)
Glucose-Capillary: 153 mg/dL — ABNORMAL HIGH (ref 70–99)
Glucose-Capillary: 214 mg/dL — ABNORMAL HIGH (ref 70–99)

## 2019-01-01 LAB — PARATHYROID HORMONE, INTACT (NO CA): PTH: 536 pg/mL — ABNORMAL HIGH (ref 15–65)

## 2019-01-01 MED ORDER — CALCITRIOL 0.5 MCG PO CAPS
ORAL_CAPSULE | ORAL | Status: AC
  Administered 2019-01-01: 0.5 ug via ORAL
  Filled 2019-01-01: qty 1

## 2019-01-01 MED ORDER — CALCITRIOL 0.5 MCG PO CAPS
0.5000 ug | ORAL_CAPSULE | Freq: Every day | ORAL | Status: DC
  Administered 2019-01-01 – 2019-01-05 (×5): 0.5 ug via ORAL
  Filled 2019-01-01 (×4): qty 1

## 2019-01-01 MED ORDER — HEPARIN SODIUM (PORCINE) 1000 UNIT/ML IJ SOLN
INTRAMUSCULAR | Status: AC
  Filled 2019-01-01: qty 1

## 2019-01-01 NOTE — Progress Notes (Signed)
PROGRESS NOTE    Colleen [name]  PZW:[id number] DOB: [birth year] DOA: 12/30/2018 PCP: Colleen Morning, DO   Brief Narrative:  HPI On 12/31/2018 by Dr. Jennette Kettle Colleen [name] is a 58 y.o. female with medical history significant of DM2, DVT/PE previously on eliquis, CKD stage 4-5 previously at baseline.  Patient has been having generalized weakness for the past few days as well as worsening peripheral edema.  Went to nephrologist today who drew labs and started her on lasix.  Labs came back with "kidney function at 2%", patient was called by nephrology and told to present to the ED.  Interim history Presented with progressive kidney disease now ESRD and uremia.  Nephrology consulted and is started hemodialysis.  Patient also has access placement by vascular surgery.  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 -Seems that she was seen by Dr. Hollie Salk on 12/29/2018, creatinine was found to be 16 and attempts were made for vascular evaluation however patient was unable to attend.  She was directed to come to the emergency department. -Status post bilateral upper extremity vein mapping -Nephrology consulted and appreciated. Patient started HD    -Vascular surgery consulted and appreciated , s/p for stage L arm basilic vein fistula, R IJ tunneled HD catheter. Plan for follow up in 4-6 weeks for fistula evaluation to plan 2nd stage   Hyperkalemia -She was given bicarb and calcium gluconate in the emergency department -resolved -Continue to monitor BMP  Chronic microcytic anemia -baseline hemoglobin approximately 8, with ranges up to 10 at times. -Hemoglobin dropped to 6.8,  patient was transfused 1 unit PRBC overnight -Hemoglobin currently 8, will continue to monitor CBC  Anion gap metabolic acidosis -Secondary to the above -Continue bicarb replacement and monitor  Essential hypertension -Continue Coreg and Norvasc  Diabetes mellitus, type II -Continue Lantus, insulin sliding scale with CBG monitoring  DVT Prophylaxis  SCDs  Code Status: Full  Family Communication: Mother at bedside  Disposition Plan: Admitted.  Dispo likely home, however pending further recommendations from nephrology  Consultants Nephrology Vascular surgery  Procedures  bilateral upper extremity vein mapping Placement of RIJ palindrome catheter Left arm basilic vein fistula  Antibiotics   Anti-infectives (From admission, onward)   Start     Dose/Rate Route Frequency Ordered Stop   12/31/18 0745  cefUROXime (ZINACEF) 1.5 g in sodium chloride 0.9 % 100 mL IVPB     1.5 g 200 mL/hr over 30 Minutes Intravenous To [facility] 12/31/18 0737 12/31/18 1433      Subjective:   [name] seen and examined today.  Seen in hemodialysis.  Has no complaints today.  Denies current chest pain, shortness breath, abdominal pain, nausea or vomiting, diarrhea constipation, dizziness or headache.  Feels her appetite is improving.  Objective:   Vitals:   01/01/19 0930 01/01/19 1000 01/01/19 1030 01/01/19 1050  BP: (!) 154/69 (!) 161/72 (!) 169/79 (!) (P) 169/75  Pulse: 98 98 100 (P) 100  Resp:    (P) 14  Temp:    (P) 98.5 F (36.9 C)  TempSrc:    (P) Oral  SpO2:    (P) 97%  Weight:    (P) 79.3 kg  Height:        Intake/Output Summary (  Last 24 hours) at 01/01/2019 1213 Last data filed at 01/01/2019 1050 Gross per 24 hour  Intake 575 ml  Output 2056 ml  Net -1481 ml   Filed Weights   12/31/18 2137 01/01/19 0738 01/01/19 1050  Weight: 79.3 kg 80.3 kg (P) 79.3 kg   Exam  General: Well developed, well nourished, NAD, appears stated age  HEENT: NCAT, mucous  membranes moist.   Neck: Supple  Cardiovascular: S1 S2 auscultated, 2/6 SEM, RRR  Respiratory: Diminished breath sounds, fine rales   Abdomen: Soft, nontender, nondistended, + bowel sounds  Extremities: warm dry without cyanosis clubbing. LE edema, improving.   Neuro: AAOx3, nonfocal  Skin: Without rashes exudates or nodules  Psych: Appropriate mood and affect   Data Reviewed: I have personally reviewed following labs and imaging studies  CBC: Recent Labs  Lab 12/28/18 1110 12/30/18 2319 12/31/18 1930 01/01/19 0522  WBC  --  11.7* 11.0* 9.3  HGB 8.2* 7.0* 6.8* 8.0*  HCT  --  23.7* 23.2* 25.8*  MCV  --  77.5* 78.1* 77.5*  PLT  --  281 280 240   Basic Metabolic Panel: Recent Labs  Lab 12/30/18 2319 12/31/18 0341 01/01/19 0522  NA 138 138 138  K 5.8* 5.9* 5.0  CL 111 111 106  CO2 13* 12* 16*  GLUCOSE 238* 164* 344*  BUN 110* 112* 77*  CREATININE 17.59* 17.13* 12.60*  CALCIUM 7.3* 7.5* 7.6*  PHOS  --  >30.0* 9.9*   GFR: Estimated Creatinine Clearance: 5.1 mL/min (A) (by C-G formula based on SCr of 12.6 mg/dL (H)). Liver Function Tests: Recent Labs  Lab 12/31/18 1930 01/01/19 0522  ALT 9  --   ALBUMIN  --  1.3*   No results for input(s): LIPASE, AMYLASE in the last 168 hours. No results for input(s): AMMONIA in the last 168 hours. Coagulation Profile: Recent Labs  Lab 12/31/18 0341  INR 1.06   Cardiac Enzymes: No results for input(s): CKTOTAL, CKMB, CKMBINDEX, TROPONINI in the last 168 hours. BNP (last 3 results) No results for input(s): PROBNP in the last 8760 hours. HbA1C: No results for input(s): HGBA1C in the last 72 hours. CBG: Recent Labs  Lab 12/31/18 1126 12/31/18 1319 12/31/18 1637 12/31/18 2207 01/01/19 0624  GLUCAP 97 97 92 118* 326*   Lipid Profile: No results for input(s): CHOL, HDL, LDLCALC, TRIG, CHOLHDL, LDLDIRECT in the last 72 hours. Thyroid Function Tests: No results for input(s): TSH, T4TOTAL, FREET4, T3FREE,  THYROIDAB in the last 72 hours. Anemia Panel: Recent Labs    12/31/18 1930  FERRITIN 547*  TIBC 143*  IRON 31   Urine analysis:    Component Value Date/Time   COLORURINE STRAW (A) 06/05/2018 0910   APPEARANCEUR HAZY (A) 06/05/2018 0910   LABSPEC 1.016 06/05/2018 0910   PHURINE 6.0 06/05/2018 0910   GLUCOSEU >=500 (A) 06/05/2018 0910   HGBUR SMALL (A) 06/05/2018 0910   BILIRUBINUR NEGATIVE 06/05/2018 0910   KETONESUR NEGATIVE 06/05/2018 0910   PROTEINUR >=300 (A) 06/05/2018 0910   UROBILINOGEN 0.2 09/22/2013 0803   NITRITE NEGATIVE 06/05/2018 0910   LEUKOCYTESUR NEGATIVE 06/05/2018 0910   Sepsis Labs: @LABRCNTIP (procalcitonin:4,lacticidven:4)  ) Recent Results (from the past 240 hour(s))  MRSA PCR Screening     Status: None   Collection Time: 12/31/18  4:45 AM  Result Value Ref Range Status   MRSA by PCR NEGATIVE NEGATIVE Final    Comment:        The GeneXpert MRSA Assay (FDA approved for NASAL specimens  only), is one component of a comprehensive MRSA colonization surveillance program. It is not intended to diagnose MRSA infection nor to guide or monitor treatment for MRSA infections. Performed at [facility], [place] [address], [place], [place] [address]       Radiology Studies: Dg Chest 1 View  Result Date: 12/31/2018 CLINICAL DATA:  Central line placement EXAM: CHEST  1 VIEW COMPARISON:  12/31/2018, 06/05/2018 FINDINGS: Right-sided central venous catheter with tip over the right atrium. No pneumothorax. Cardiomegaly with vascular congestion, pulmonary edema and small bilateral right greater than left pleural effusions. Bibasilar consolidations. Splaying of the carina suggesting atrial enlargement. IMPRESSION: 1. Right-sided central venous catheter tip over the low right atrium. No pneumothorax. 2. Cardiomegaly with vascular congestion, pulmonary edema and small right greater than left pleural effusions. Electronically Signed   By: Donavan Foil M.D.   On:  12/31/2018 16:48   Dg Chest 2 View  Result Date: 12/31/2018 CLINICAL DATA:  Dyspnea with leg swelling. EXAM: CHEST - 2 VIEW COMPARISON:  06/05/2018 FINDINGS: Stable cardiomegaly with aortic atherosclerosis. New bilateral small pleural effusions obscuring the diaphragms are identified with interstitial edema. No acute osseous abnormality. IMPRESSION: 1. Cardiomegaly with aortic atherosclerosis. 2. New bilateral small pleural effusions with interstitial edema consistent with CHF. Electronically Signed   By: Ashley Royalty M.D.   On: 12/31/2018 02:36   Dg Fluoro Guide Cv Line-no Report  Result Date: 12/31/2018 Fluoroscopy was utilized by the requesting physician.  No radiographic interpretation.   Vas [name] Upper Ext Vein Mapping (pre-op Avf)  Result Date: 12/31/2018 UPPER EXTREMITY VEIN MAPPING  Indications: Pre-access. History: ESRD.  Performing Technologist: Toma Copier RVS  Examination Guidelines: A complete evaluation includes B-mode imaging, spectral Doppler, color Doppler, and power Doppler as needed of all accessible portions of each vessel. Bilateral testing is considered an integral part of a complete examination. Limited examinations for reoccurring indications may be performed as noted. +-----------------+-------------+----------+------------+ Right Cephalic   Diameter (cm)Depth (cm)  Findings   +-----------------+-------------+----------+------------+ Shoulder             0.19        0.25                +-----------------+-------------+----------+------------+ Prox upper arm       0.30        0.24                +-----------------+-------------+----------+------------+ Mid upper arm        0.23        0.16                +-----------------+-------------+----------+------------+ Dist upper arm       0.28        0.19                +-----------------+-------------+----------+------------+ Antecubital fossa    0.35        0.23   Moves medial  +-----------------+-------------+----------+------------+ Prox forearm         0.33        0.43    branching   +-----------------+-------------+----------+------------+ Mid forearm          0.35        0.36                +-----------------+-------------+----------+------------+ Wrist                0.34        0.28                +-----------------+-------------+----------+------------+ +-----------------+-------------+----------+---------------+  Right Basilic    Diameter (cm)Depth (cm)   Findings     +-----------------+-------------+----------+---------------+ Mid upper arm        0.53        1.32   Enters brachial +-----------------+-------------+----------+---------------+ Dist upper arm       0.54        1.13                   +-----------------+-------------+----------+---------------+ Antecubital fossa    0.37        0.69                   +-----------------+-------------+----------+---------------+ Prox forearm         0.30        0.45                   +-----------------+-------------+----------+---------------+ Mid forearm          0.29        0.31                   +-----------------+-------------+----------+---------------+ Wrist                0.16        0.27                   +-----------------+-------------+----------+---------------+ +-----------------+-------------+----------+--------+ Left Cephalic    Diameter (cm)Depth (cm)Findings +-----------------+-------------+----------+--------+ Shoulder             0.26        0.35            +-----------------+-------------+----------+--------+ Prox upper arm       0.26        0.55            +-----------------+-------------+----------+--------+ Mid upper arm        0.26        0.23            +-----------------+-------------+----------+--------+ Dist upper arm       0.28        0.15            +-----------------+-------------+----------+--------+ Antecubital fossa    0.23         0.12            +-----------------+-------------+----------+--------+ Prox forearm         0.43        0.46            +-----------------+-------------+----------+--------+ Mid forearm          0.37        0.45            +-----------------+-------------+----------+--------+ Wrist                0.42        0.27            +-----------------+-------------+----------+--------+ +-----------------+-------------+----------+---------------+ Left Basilic     Diameter (cm)Depth (cm)   Findings     +-----------------+-------------+----------+---------------+ Mid upper arm        0.50        1.21   enters brachial +-----------------+-------------+----------+---------------+ Dist upper arm       0.46        1.29                   +-----------------+-------------+----------+---------------+ Antecubital fossa    0.28        0.43                   +-----------------+-------------+----------+---------------+  Prox forearm         0.30        0.32                   +-----------------+-------------+----------+---------------+ Mid forearm          0.26        0.25                   +-----------------+-------------+----------+---------------+ Wrist                0.26        0.48                   +-----------------+-------------+----------+---------------+ *See table(s) above for measurements and observations.  Diagnosing physician: Servando Snare MD Electronically signed by Servando Snare MD on 12/31/2018 at 12:07:16 PM.    Final      Scheduled Meds: . amLODipine  10 mg Oral Daily  . calcitRIOL  0.5 mcg Oral Daily  . calcium acetate  1,334 mg Oral TID WC  . carvedilol  12.5 mg Oral BID WC  . Chlorhexidine Gluconate Cloth  6 each Topical Q0600  . darbepoetin (ARANESP) injection - DIALYSIS  60 mcg Intravenous Q Fri-HD  . heparin      . insulin aspart  0-9 Units Subcutaneous TID WC  . insulin detemir  4 Units Subcutaneous QHS  . sodium bicarbonate  650 mg Oral TID    Continuous Infusions: . sodium chloride    . sodium chloride    . sodium chloride 10 mL/hr at 12/31/18 1430  . ferric gluconate (FERRLECIT/NULECIT) IV 125 mg (12/31/18 1944)     LOS: 1 day   Time Spent in minutes   30 minutes  Chanler Mendonca D.O. on 01/01/2019 at 12:13 PM  Between 7am to 7pm - Please see pager noted on [email or web address]  After 7pm go to [email or web address]  And look for the night coverage person covering for me after hours  [facility] Office  [phone]

## 2019-01-01 NOTE — Progress Notes (Signed)
  Progress Note    01/01/2019 9:22 AM 1 Day Post-Op  Subjective: Doing well this morning on dialysis  Vitals:   01/01/19 0830 01/01/19 0900  BP: (!) 153/81 (!) 156/72  Pulse: 100 98  Resp:    Temp:    SpO2:      Physical Exam: Awake alert oriented Dialysis currently running through right IJ catheter Bilateral upper extremities have edema Left arm palpable thrill in the upper arm, radial pulse the wrist and the hand is warm and well-perfused and sensorimotor intact.  CBC    Component Value Date/Time   WBC 9.3 01/01/2019 0522   RBC 3.33 (L) 01/01/2019 0522   HGB 8.0 (L) 01/01/2019 0522   HGB 8.7 (L) 08/24/2018 1120   HCT 25.8 (L) 01/01/2019 0522   PLT 291 01/01/2019 0522   PLT 247 08/24/2018 1120   MCV 77.5 (L) 01/01/2019 0522   MCH 24.0 (L) 01/01/2019 0522   MCHC 31.0 01/01/2019 0522   RDW 16.2 (H) 01/01/2019 0522   LYMPHSABS 1.2 08/24/2018 1120   MONOABS 0.7 08/24/2018 1120   EOSABS 0.2 08/24/2018 1120   BASOSABS 0.0 08/24/2018 1120    BMET    Component Value Date/Time   NA 138 01/01/2019 0522   K 5.0 01/01/2019 0522   CL 106 01/01/2019 0522   CO2 16 (L) 01/01/2019 0522   GLUCOSE 344 (H) 01/01/2019 0522   BUN 77 (H) 01/01/2019 0522   CREATININE 12.60 (H) 01/01/2019 0522   CREATININE 4.83 (HH) 08/24/2018 1120   CALCIUM 7.6 (L) 01/01/2019 0522   GFRNONAA 3 (L) 01/01/2019 0522   GFRNONAA 9 (L) 08/24/2018 1120   GFRAA 3 (L) 01/01/2019 0522   GFRAA 11 (L) 08/24/2018 1120    INR    Component Value Date/Time   INR 1.06 12/31/2018 0341     Intake/Output Summary (Last 24 hours) at 01/01/2019 7371 Last data filed at 12/31/2018 2200 Gross per 24 hour  Intake 575 ml  Output 1056 ml  Net -481 ml     Assessment:  58 y.o. female is s/p placement of right IJ tunneled dialysis catheter as well as for stage left arm basilic vein fistula.  Plan: We will set up follow-up in 4 to 6 weeks for fistula evaluation to plan second stage. She will need to use  catheter until that time.`   Osman Calzadilla C. Donzetta Matters, MD [facility] Office: [phone] Pager: [phone]  01/01/2019 9:22 AM

## 2019-01-01 NOTE — Progress Notes (Signed)
Patient ID: Colleen [name], female   DOB: 22-Jan-1961, 58 y.o.   MRN: [id number]   [facility] Progress Note   Assessment/ Plan:   1.  Uremia/hyperkalemia: From progressive chronic kidney disease and now end-stage renal disease, improving with dialysis. 2. ESRD: From progressive chronic kidney disease due to underlying diabetic nephropathy.  Now on maintenance hemodialysis via right IJ TDC and underwent first stage left brachiobasilic fistula-appreciate vascular surgery.  Process initiated for outpatient dialysis unit placement. 3. Anemia: Continue ESA/intravenous iron.  No overt loss. 4. CKD-MBD: Started on phosphorus binder yesterday, will discontinue aluminum hydroxide as the initial value of phosphorus >30 is likely inaccurate.  Significantly elevated PTH, begin calcitriol. 5. Nutrition: Continue renal diet and renal multivitamin.  Reeducated on 70/2/2 diet. 6. Hypertension: Monitor blood pressure trend while on hemodialysis with ultrafiltration/resumption of oral antihypertensive agents.  Subjective:   Reports to be feeling well, tolerated her first dialysis yesterday without problems.   Objective:   BP (!) 153/81   Pulse 100   Temp 98.6 F (37 C) (Oral)   Resp 15   Ht 5\' 4"  (1.626 m)   Wt 80.3 kg Comment: stood to scale  LMP 12/08/2013   SpO2 97%   BMI 30.39 kg/m   Physical Exam: Gen: Comfortably resting on dialysis CVS: Pulse regular tachycardia, S1 and S2 with ejection systolic murmur Resp: Diminished breath sounds over both bases with fine rales left side Abd: Soft, obese, nontender Ext: 3+ lower extremity edema with 2+ upper extremity edema.  Left BBF with well apposed scar and decent thrill/bruit.  Labs: BMET Recent Labs  Lab 12/30/18 2319 12/31/18 0341 01/01/19 0522  NA 138 138 138  K 5.8* 5.9* 5.0  CL 111 111 106  CO2 13* 12* 16*  GLUCOSE 238* 164* 344*  BUN 110* 112* 77*  CREATININE 17.59* 17.13* 12.60*  CALCIUM 7.3* 7.5* 7.6*  PHOS  --   >30.0* 9.9*   CBC Recent Labs  Lab 12/28/18 1110 12/30/18 2319 12/31/18 1930 01/01/19 0522  WBC  --  11.7* 11.0* 9.3  HGB 8.2* 7.0* 6.8* 8.0*  HCT  --  23.7* 23.2* 25.8*  MCV  --  77.5* 78.1* 77.5*  PLT  --  281 280 291   Medications:    . aluminum hydroxide  30 mL Oral TID WC  . amLODipine  10 mg Oral Daily  . calcium acetate  1,334 mg Oral TID WC  . carvedilol  12.5 mg Oral BID WC  . Chlorhexidine Gluconate Cloth  6 each Topical Q0600  . darbepoetin (ARANESP) injection - DIALYSIS  60 mcg Intravenous Q Fri-HD  . insulin aspart  0-9 Units Subcutaneous TID WC  . insulin detemir  4 Units Subcutaneous QHS  . sodium bicarbonate  650 mg Oral TID   Elmarie Shiley, MD 01/01/2019, 8:48 AM

## 2019-01-01 NOTE — Procedures (Signed)
Patient seen on Hemodialysis. BP (!) 153/81   Pulse 100   Temp 98.6 F (37 C) (Oral)   Resp 15   Ht 5\' 4"  (1.626 m)   Wt 80.3 kg Comment: stood to scale  LMP 12/08/2013   SpO2 97%   BMI 30.39 kg/m   QB 300, UF goal 1L Tolerating treatment without complaints at this time.   Colleen Shiley MD [facility]. Office # [phone] Pager # [phone] 8:47 AM

## 2019-01-02 LAB — RENAL FUNCTION PANEL
Albumin: 1.3 g/dL — ABNORMAL LOW (ref 3.5–5.0)
Anion gap: 12 (ref 5–15)
BUN: 44 mg/dL — ABNORMAL HIGH (ref 6–20)
CO2: 23 mmol/L (ref 22–32)
Calcium: 7.8 mg/dL — ABNORMAL LOW (ref 8.9–10.3)
Chloride: 100 mmol/L (ref 98–111)
Creatinine, Ser: 8.69 mg/dL — ABNORMAL HIGH (ref 0.44–1.00)
GFR calc Af Amer: 5 mL/min — ABNORMAL LOW (ref >60)
GFR calc non Af Amer: 5 mL/min — ABNORMAL LOW (ref >60)
Glucose, Bld: 193 mg/dL — ABNORMAL HIGH (ref 70–99)
Phosphorus: 7.1 mg/dL — ABNORMAL HIGH (ref 2.5–4.6)
Potassium: 4.2 mmol/L (ref 3.5–5.1)
Sodium: 135 mmol/L (ref 135–145)

## 2019-01-02 LAB — CBC
HCT: 26 % — ABNORMAL LOW (ref 36.0–46.0)
Hemoglobin: 8.1 g/dL — ABNORMAL LOW (ref 12.0–15.0)
MCH: 24.5 pg — ABNORMAL LOW (ref 26.0–34.0)
MCHC: 31.2 g/dL (ref 30.0–36.0)
MCV: 78.5 fL — ABNORMAL LOW (ref 80.0–100.0)
Platelets: 281 10*3/uL (ref 150–400)
RBC: 3.31 MIL/uL — ABNORMAL LOW (ref 3.87–5.11)
RDW: 15.7 % — ABNORMAL HIGH (ref 11.5–15.5)
WBC: 10.2 10*3/uL (ref 4.0–10.5)
nRBC: 0 % (ref 0.0–0.2)

## 2019-01-02 LAB — GLUCOSE, CAPILLARY
Glucose-Capillary: 162 mg/dL — ABNORMAL HIGH (ref 70–99)
Glucose-Capillary: 159 mg/dL — ABNORMAL HIGH (ref 70–99)
Glucose-Capillary: 192 mg/dL — ABNORMAL HIGH (ref 70–99)
Glucose-Capillary: 197 mg/dL — ABNORMAL HIGH (ref 70–99)

## 2019-01-02 NOTE — Progress Notes (Signed)
PROGRESS NOTE    Colleen [name]  GEZ:[id number] DOB: 1961/09/16 DOA: 12/30/2018 PCP: [name], DO   Brief Narrative:  HPI On 12/31/2018 by Dr. Jennette Kettle Colleen [name] is a 58 y.o. female with medical history significant of DM2, DVT/PE previously on eliquis, CKD stage 4-5 previously at baseline.  Patient has been having generalized weakness for the past few days as well as worsening peripheral edema.  Went to nephrologist today who drew labs and started her on lasix.  Labs came back with "kidney function at 2%", patient was called by nephrology and told to present to the ED.  Interim history Presented with progressive kidney disease now ESRD and uremia.  Nephrology consulted and is started hemodialysis.  Patient also has access placement by vascular surgery. Patient had to receive 1u PRBC.   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 -Seems that she was seen by Dr. Hollie Salk on 12/29/2018, creatinine was found to be 16 and attempts were made for vascular evaluation however patient was unable to attend.  She was directed to come to the emergency department. -Status post bilateral upper extremity vein mapping -Nephrology consulted and appreciated. Patient started HD. Process initiated for outpatient HD    -Vascular surgery consulted and appreciated , s/p for stage L arm basilic vein fistula, R IJ tunneled HD catheter. Plan for follow up in 4-6 weeks for fistula evaluation to plan 2nd stage   Hyperkalemia -She was given bicarb and calcium gluconate in the emergency department -resolved, continue to monitor BMP  Chronic microcytic anemia -baseline hemoglobin approximately  8, with ranges up to 10 at times. -Hemoglobin dropped to 6.8, patient was transfused 1 unit PRBC overnight -Hemoglobin currently 8.1, Continue to monitor CBC  Anion gap metabolic acidosis -Resolved, Secondary to the above -Continue bicarb replacement and monitor  Essential hypertension -Stable, Continue Coreg and Norvasc  Diabetes mellitus, type II -Continue Lantus, insulin sliding scale with CBG monitoring  DVT Prophylaxis  SCDs  Code Status: Full  Family Communication: None at bedside  Disposition Plan: Admitted.  Dispo likely home, however pending further recommendations from nephrology  Consultants Nephrology Vascular surgery  Procedures  bilateral upper extremity vein mapping Placement of RIJ palindrome catheter Left arm basilic vein fistula  Antibiotics   Anti-infectives (From admission, onward)   Start     Dose/Rate Route Frequency Ordered Stop   12/31/18 0745  cefUROXime (ZINACEF) 1.5 g in sodium chloride 0.9 % 100 mL IVPB     1.5 g 200 mL/hr over 30 Minutes Intravenous To [facility] 12/31/18 0737 12/31/18 1433      Subjective:   Colleen [name] seen and examined today.  Patient has no complaints today.  Denies chest pain, shortness breath, abdominal pain, nausea vomiting, diarrhea constipation, dizziness or headache.  Objective:   Vitals:   01/01/19 1050 01/01/19 1244 01/01/19 2009 01/02/19 0400  BP: (!) 169/75 (!) 165/71 130/70 (!) 142/69  Pulse: 100 (!) 101 98 98  Resp: 14 16 16 16   Temp: 98.5 F (36.9 C) 98.1 F (36.7 C) 99.7 F (37.6 C) 99.2 F (37.3 C)  TempSrc: Oral Oral Oral Oral  SpO2: 97% 96% 93% 94%  Weight: 79.3 kg     Height:  Intake/Output Summary (Last 24 hours) at 01/02/2019 0941 Last data filed at 01/01/2019 1700 Gross per 24 hour  Intake 480 ml  Output 1000 ml  Net -520 ml   Filed Weights   12/31/18 2137 01/01/19 0738 01/01/19 1050  Weight: 79.3 kg 80.3 kg 79.3 kg   Exam  General: Well developed, well  nourished, NAD, appears stated age  84: NCAT, mucous membranes moist.   Neck: Supple  Cardiovascular: S1 S2 auscultated, 2/6 SEM, RRR  Respiratory: Diminished breath sounds  Abdomen: Soft, nontender, nondistended, + bowel sounds  Extremities: warm dry without cyanosis clubbing. ++B/L UE edema, +LE edema  Neuro: AAOx3, nonfocal  Psych: Appropriate mood and affect, pleasant  Data Reviewed: I have personally reviewed following labs and imaging studies  CBC: Recent Labs  Lab 12/28/18 1110 12/30/18 2319 12/31/18 1930 01/01/19 0522 01/02/19 0403  WBC  --  11.7* 11.0* 9.3 10.2  HGB 8.2* 7.0* 6.8* 8.0* 8.1*  HCT  --  23.7* 23.2* 25.8* 26.0*  MCV  --  77.5* 78.1* 77.5* 78.5*  PLT  --  281 280 291 191   Basic Metabolic Panel: Recent Labs  Lab 12/30/18 2319 12/31/18 0341 01/01/19 0522 01/02/19 0403  NA 138 138 138 135  K 5.8* 5.9* 5.0 4.2  CL 111 111 106 100  CO2 13* 12* 16* 23  GLUCOSE 238* 164* 344* 193*  BUN 110* 112* 77* 44*  CREATININE 17.59* 17.13* 12.60* 8.69*  CALCIUM 7.3* 7.5* 7.6* 7.8*  PHOS  --  >30.0* 9.9* 7.1*   GFR: Estimated Creatinine Clearance: 7.4 mL/min (A) (by C-G formula based on SCr of 8.69 mg/dL (H)). Liver Function Tests: Recent Labs  Lab 12/31/18 1930 01/01/19 0522 01/02/19 0403  ALT 9  --   --   ALBUMIN  --  1.3* 1.3*   No results for input(s): LIPASE, AMYLASE in the last 168 hours. No results for input(s): AMMONIA in the last 168 hours. Coagulation Profile: Recent Labs  Lab 12/31/18 0341  INR 1.06   Cardiac Enzymes: No results for input(s): CKTOTAL, CKMB, CKMBINDEX, TROPONINI in the last 168 hours. BNP (last 3 results) No results for input(s): PROBNP in the last 8760 hours. HbA1C: No results for input(s): HGBA1C in the last 72 hours. CBG: Recent Labs  Lab 01/01/19 0624 01/01/19 1237 01/01/19 1549 01/01/19 2134 01/02/19 0647  GLUCAP 326* 170* 153* 214* 162*   Lipid Profile: No results for input(s): CHOL, HDL,  LDLCALC, TRIG, CHOLHDL, LDLDIRECT in the last 72 hours. Thyroid Function Tests: No results for input(s): TSH, T4TOTAL, FREET4, T3FREE, THYROIDAB in the last 72 hours. Anemia Panel: Recent Labs    12/31/18 1930  FERRITIN 547*  TIBC 143*  IRON 31   Urine analysis:    Component Value Date/Time   COLORURINE STRAW (A) 06/05/2018 0910   APPEARANCEUR HAZY (A) 06/05/2018 0910   LABSPEC 1.016 06/05/2018 0910   PHURINE 6.0 06/05/2018 0910   GLUCOSEU >=500 (A) 06/05/2018 0910   HGBUR SMALL (A) 06/05/2018 0910   BILIRUBINUR NEGATIVE 06/05/2018 0910   KETONESUR NEGATIVE 06/05/2018 0910   PROTEINUR >=300 (A) 06/05/2018 0910   UROBILINOGEN 0.2 09/22/2013 0803   NITRITE NEGATIVE 06/05/2018 0910   LEUKOCYTESUR NEGATIVE 06/05/2018 0910   Sepsis Labs: @LABRCNTIP (procalcitonin:4,lacticidven:4)  ) Recent Results (from the past 240 hour(s))  MRSA PCR Screening     Status: None   Collection Time: 12/31/18  4:45 AM  Result Value Ref Range Status   MRSA by PCR NEGATIVE NEGATIVE Final    Comment:  The GeneXpert MRSA Assay (FDA approved for NASAL specimens only), is one component of a comprehensive MRSA colonization surveillance program. It is not intended to diagnose MRSA infection nor to guide or monitor treatment for MRSA infections. Performed at [facility], [place] [address], [place], [place] [address]       Radiology Studies: Dg Chest 1 View  Result Date: 12/31/2018 CLINICAL DATA:  Central line placement EXAM: CHEST  1 VIEW COMPARISON:  12/31/2018, 06/05/2018 FINDINGS: Right-sided central venous catheter with tip over the right atrium. No pneumothorax. Cardiomegaly with vascular congestion, pulmonary edema and small bilateral right greater than left pleural effusions. Bibasilar consolidations. Splaying of the carina suggesting atrial enlargement. IMPRESSION: 1. Right-sided central venous catheter tip over the low right atrium. No pneumothorax. 2. Cardiomegaly with vascular  congestion, pulmonary edema and small right greater than left pleural effusions. Electronically Signed   By: Donavan Foil M.D.   On: 12/31/2018 16:48   Dg Fluoro Guide Cv Line-no Report  Result Date: 12/31/2018 Fluoroscopy was utilized by the requesting physician.  No radiographic interpretation.   Vas [name] Upper Ext Vein Mapping (pre-op Avf)  Result Date: 12/31/2018 UPPER EXTREMITY VEIN MAPPING  Indications: Pre-access. History: ESRD.  Performing Technologist: Toma Copier RVS  Examination Guidelines: A complete evaluation includes B-mode imaging, spectral Doppler, color Doppler, and power Doppler as needed of all accessible portions of each vessel. Bilateral testing is considered an integral part of a complete examination. Limited examinations for reoccurring indications may be performed as noted. +-----------------+-------------+----------+------------+ Right Cephalic   Diameter (cm)Depth (cm)  Findings   +-----------------+-------------+----------+------------+ Shoulder             0.19        0.25                +-----------------+-------------+----------+------------+ Prox upper arm       0.30        0.24                +-----------------+-------------+----------+------------+ Mid upper arm        0.23        0.16                +-----------------+-------------+----------+------------+ Dist upper arm       0.28        0.19                +-----------------+-------------+----------+------------+ Antecubital fossa    0.35        0.23   Moves medial +-----------------+-------------+----------+------------+ Prox forearm         0.33        0.43    branching   +-----------------+-------------+----------+------------+ Mid forearm          0.35        0.36                +-----------------+-------------+----------+------------+ Wrist                0.34        0.28                +-----------------+-------------+----------+------------+  +-----------------+-------------+----------+---------------+ Right Basilic    Diameter (cm)Depth (cm)   Findings     +-----------------+-------------+----------+---------------+ Mid upper arm        0.53        1.32   Enters brachial +-----------------+-------------+----------+---------------+ Dist upper arm       0.54        1.13                   +-----------------+-------------+----------+---------------+  Antecubital fossa    0.37        0.69                   +-----------------+-------------+----------+---------------+ Prox forearm         0.30        0.45                   +-----------------+-------------+----------+---------------+ Mid forearm          0.29        0.31                   +-----------------+-------------+----------+---------------+ Wrist                0.16        0.27                   +-----------------+-------------+----------+---------------+ +-----------------+-------------+----------+--------+ Left Cephalic    Diameter (cm)Depth (cm)Findings +-----------------+-------------+----------+--------+ Shoulder             0.26        0.35            +-----------------+-------------+----------+--------+ Prox upper arm       0.26        0.55            +-----------------+-------------+----------+--------+ Mid upper arm        0.26        0.23            +-----------------+-------------+----------+--------+ Dist upper arm       0.28        0.15            +-----------------+-------------+----------+--------+ Antecubital fossa    0.23        0.12            +-----------------+-------------+----------+--------+ Prox forearm         0.43        0.46            +-----------------+-------------+----------+--------+ Mid forearm          0.37        0.45            +-----------------+-------------+----------+--------+ Wrist                0.42        0.27            +-----------------+-------------+----------+--------+  +-----------------+-------------+----------+---------------+ Left Basilic     Diameter (cm)Depth (cm)   Findings     +-----------------+-------------+----------+---------------+ Mid upper arm        0.50        1.21   enters brachial +-----------------+-------------+----------+---------------+ Dist upper arm       0.46        1.29                   +-----------------+-------------+----------+---------------+ Antecubital fossa    0.28        0.43                   +-----------------+-------------+----------+---------------+ Prox forearm         0.30        0.32                   +-----------------+-------------+----------+---------------+ Mid forearm          0.26        0.25                   +-----------------+-------------+----------+---------------+  Wrist                0.26        0.48                   +-----------------+-------------+----------+---------------+ *See table(s) above for measurements and observations.  Diagnosing physician: Servando Snare MD Electronically signed by Servando Snare MD on 12/31/2018 at 12:07:16 PM.    Final      Scheduled Meds: . amLODipine  10 mg Oral Daily  . calcitRIOL  0.5 mcg Oral Daily  . calcium acetate  1,334 mg Oral TID WC  . carvedilol  12.5 mg Oral BID WC  . Chlorhexidine Gluconate Cloth  6 each Topical Q0600  . darbepoetin (ARANESP) injection - DIALYSIS  60 mcg Intravenous Q Fri-HD  . insulin aspart  0-9 Units Subcutaneous TID WC  . insulin detemir  4 Units Subcutaneous QHS  . sodium bicarbonate  650 mg Oral TID   Continuous Infusions: . sodium chloride    . sodium chloride    . sodium chloride 10 mL/hr at 12/31/18 1430  . ferric gluconate (FERRLECIT/NULECIT) IV 125 mg (12/31/18 1944)     LOS: 2 days   Time Spent in minutes   30 minutes  Bettylou Frew D.O. on 01/02/2019 at 9:41 AM  Between 7am to 7pm - Please see pager noted on [email or web address]  After 7pm go to [email or web address]  And look for the night coverage person  covering for me after hours  [facility] Office  [phone]

## 2019-01-02 NOTE — Progress Notes (Signed)
Patient ID: Colleen [name], female   DOB: [birth year], 58 y.o.   MRN: [id number]  [facility] Progress Note   Assessment/ Plan:   1.  Uremia/hyperkalemia: From progressive chronic kidney disease and now end-stage renal disease, improving with dialysis. 2. ESRD: From progressive chronic kidney disease due to underlying diabetic nephropathy.  Started on hemodialysis via right IJ TDC and underwent first stage left brachiobasilic fistula-appreciate vascular surgery.  Process is underway for outpatient dialysis unit placement. 3. Anemia: Continue ESA/intravenous iron.  No overt loss. 4. CKD-MBD: Continue phosphorus binder for management of hyperphosphatemia and calcitriol for PTH suppression. 5. Nutrition: Continue renal diet and renal multivitamin.  Reeducated on 70/2/2 diet. 6. Hypertension: Monitor blood pressure trend while on hemodialysis with ultrafiltration/resumption of oral antihypertensive agents.  Subjective:   Reports to be feeling better with hemodialysis, denies any chest pain or shortness of breath.   Objective:   BP (!) 142/69 (BP Location: Right Arm)   Pulse 98   Temp 99.2 F (37.3 C) (Oral)   Resp 16   Ht 5\' 4"  (1.626 m)   Wt 79.3 kg   LMP 12/08/2013   SpO2 94%   BMI 30.01 kg/m   Physical Exam: Gen: Comfortably resting in bed CVS: Pulse regular rhythm and normal rate, S1 and S2 with ejection systolic murmur Resp: Diminished breath sounds over both bases without rales/rhonchi Abd: Soft, obese, nontender Ext: 1-2+ lower extremity edema with 1+ upper extremity edema.  Left BBF with healing scar and decent thrill/bruit.  Labs: BMET Recent Labs  Lab 12/30/18 2319 12/31/18 0341 01/01/19 0522 01/02/19 0403  NA 138 138 138 135  K 5.8* 5.9* 5.0 4.2  CL 111 111 106 100  CO2 13* 12* 16* 23  GLUCOSE 238* 164* 344* 193*  BUN 110* 112* 77* 44*  CREATININE 17.59* 17.13* 12.60* 8.69*  CALCIUM 7.3* 7.5* 7.6* 7.8*  PHOS  --  >30.0* 9.9* 7.1*    CBC Recent Labs  Lab 12/30/18 2319 12/31/18 1930 01/01/19 0522 01/02/19 0403  WBC 11.7* 11.0* 9.3 10.2  HGB 7.0* 6.8* 8.0* 8.1*  HCT 23.7* 23.2* 25.8* 26.0*  MCV 77.5* 78.1* 77.5* 78.5*  PLT 281 280 291 281   Medications:    . amLODipine  10 mg Oral Daily  . calcitRIOL  0.5 mcg Oral Daily  . calcium acetate  1,334 mg Oral TID WC  . carvedilol  12.5 mg Oral BID WC  . Chlorhexidine Gluconate Cloth  6 each Topical Q0600  . darbepoetin (ARANESP) injection - DIALYSIS  60 mcg Intravenous Q Fri-HD  . insulin aspart  0-9 Units Subcutaneous TID WC  . insulin detemir  4 Units Subcutaneous QHS  . sodium bicarbonate  650 mg Oral TID   Elmarie Shiley, MD 01/02/2019, 10:44 AM

## 2019-01-03 ENCOUNTER — Telehealth: Payer: Self-pay | Admitting: Vascular Surgery

## 2019-01-03 LAB — RENAL FUNCTION PANEL
Albumin: 1.4 g/dL — ABNORMAL LOW (ref 3.5–5.0)
Anion gap: 12 (ref 5–15)
BUN: 60 mg/dL — ABNORMAL HIGH (ref 6–20)
CO2: 23 mmol/L (ref 22–32)
Calcium: 8.1 mg/dL — ABNORMAL LOW (ref 8.9–10.3)
Chloride: 102 mmol/L (ref 98–111)
Creatinine, Ser: 10.36 mg/dL — ABNORMAL HIGH (ref 0.44–1.00)
GFR calc Af Amer: 4 mL/min — ABNORMAL LOW (ref >60)
GFR calc non Af Amer: 4 mL/min — ABNORMAL LOW (ref >60)
Glucose, Bld: 158 mg/dL — ABNORMAL HIGH (ref 70–99)
Phosphorus: 7 mg/dL — ABNORMAL HIGH (ref 2.5–4.6)
Potassium: 4.2 mmol/L (ref 3.5–5.1)
Sodium: 137 mmol/L (ref 135–145)

## 2019-01-03 LAB — CBC
HCT: 26.4 % — ABNORMAL LOW (ref 36.0–46.0)
Hemoglobin: 7.9 g/dL — ABNORMAL LOW (ref 12.0–15.0)
MCH: 23.7 pg — ABNORMAL LOW (ref 26.0–34.0)
MCHC: 29.9 g/dL — ABNORMAL LOW (ref 30.0–36.0)
MCV: 79 fL — ABNORMAL LOW (ref 80.0–100.0)
Platelets: 266 10*3/uL (ref 150–400)
RBC: 3.34 MIL/uL — ABNORMAL LOW (ref 3.87–5.11)
RDW: 15.6 % — ABNORMAL HIGH (ref 11.5–15.5)
WBC: 10.3 10*3/uL (ref 4.0–10.5)
nRBC: 0 % (ref 0.0–0.2)

## 2019-01-03 LAB — GLUCOSE, CAPILLARY
Glucose-Capillary: 202 mg/dL — ABNORMAL HIGH (ref 70–99)
Glucose-Capillary: 204 mg/dL — ABNORMAL HIGH (ref 70–99)
Glucose-Capillary: 395 mg/dL — ABNORMAL HIGH (ref 70–99)

## 2019-01-03 MED ORDER — HEPARIN SODIUM (PORCINE) 1000 UNIT/ML DIALYSIS
40.0000 [IU]/kg | INTRAMUSCULAR | Status: DC | PRN
  Administered 2019-01-03: 3400 [IU] via INTRAVENOUS_CENTRAL
  Filled 2019-01-03: qty 3.2

## 2019-01-03 MED ORDER — HEPARIN SODIUM (PORCINE) 1000 UNIT/ML IJ SOLN
INTRAMUSCULAR | Status: AC
  Administered 2019-01-03: 3400 [IU] via INTRAVENOUS_CENTRAL
  Filled 2019-01-03: qty 4

## 2019-01-03 MED ORDER — RENA-VITE PO TABS
1.0000 | ORAL_TABLET | Freq: Every day | ORAL | Status: DC
  Administered 2019-01-03 – 2019-01-04 (×2): 1 via ORAL
  Filled 2019-01-03 (×2): qty 1

## 2019-01-03 NOTE — Telephone Encounter (Signed)
sch appt spk to pt mld ltr 02/17/2019 1pm Dialysis duplex 215pm p/o NP

## 2019-01-03 NOTE — Progress Notes (Signed)
PROGRESS NOTE    Colleen [name]  VZC:[id number] DOB: 1961-08-29 DOA: 12/30/2018 PCP: [name], DO   Brief Narrative:  HPI On 12/31/2018 by Dr. Jennette Kettle Colleen [name] is a 58 y.o. female with medical history significant of DM2, DVT/PE previously on eliquis, CKD stage 4-5 previously at baseline.  Patient has been having generalized weakness for the past few days as well as worsening peripheral edema.  Went to nephrologist today who drew labs and started her on lasix.  Labs came back with "kidney function at 2%", patient was called by nephrology and told to present to the ED.  Interim history Presented with progressive kidney disease now ESRD and uremia.  Nephrology consulted and is started hemodialysis.  Patient also has access placement by vascular surgery. Patient had to receive 1u PRBC. Pending outpatient HD setup   Assessment & Plan   Progressive nephrotic diabetic kidney disease with uremia-ESRD -Patient at baseline has chronic kidney disease, stage IV-V, however now has progressed to end-stage renal disease -Patient endorsed 1 month of progressive fatigue, anorexia, nausea and vomiting with dyschezia, progressive edema in her extremities and shortness of breath. -Seems that she was seen by Dr. Hollie Salk on 12/29/2018, creatinine was found to be 16 and attempts were made for vascular evaluation however patient was unable to attend.  She was directed to come to the emergency department. -Status post bilateral upper extremity vein mapping -Nephrology consulted and appreciated. Patient started HD. Process initiated for outpatient HD    -Vascular surgery consulted and appreciated , s/p for stage L arm basilic vein fistula, R IJ tunneled HD catheter. Plan for follow up in 4-6 weeks for fistula evaluation to plan 2nd stage   Hyperkalemia -She was given bicarb and calcium gluconate in the emergency department -resolved, continue to monitor BMP  Chronic microcytic  anemia -baseline hemoglobin approximately 8, with ranges up to 10 at times. -Hemoglobin dropped to 6.8, patient was transfused 1 unit PRBC overnight -Hemoglobin currently 7.9, Continue to monitor CBC  Anion gap metabolic acidosis -Resolved, Secondary to the above -Continue bicarb replacement and monitor  Essential hypertension -Stable, Continue Coreg and Norvasc  Diabetes mellitus, type II -Continue Lantus, insulin sliding scale with CBG monitoring  DVT Prophylaxis  SCDs  Code Status: Full  Family Communication: None at bedside  Disposition Plan: Admitted.  Dispo likely home, however pending further recommendations from nephrology  Consultants Nephrology Vascular surgery  Procedures  bilateral upper extremity vein mapping Placement of RIJ palindrome catheter Left arm basilic vein fistula  Antibiotics   Anti-infectives (From admission, onward)   Start     Dose/Rate Route Frequency Ordered Stop   12/31/18 0745  cefUROXime (ZINACEF) 1.5 g in sodium chloride 0.9 % 100 mL IVPB     1.5 g 200 mL/hr over 30 Minutes Intravenous To [facility] 12/31/18 0737 12/31/18 1433      Subjective:   Colleen [name] seen and examined today.  Patient has no complaints today.  Denies chest pain, shortness of breath, dental pain, nausea vomiting, diarrhea constipation, dizziness or headache.   Objective:   Vitals:   01/02/19 0400 01/02/19 1258 01/02/19 1937 01/03/19 0427  BP: (!) 142/69 140/66 (!) 144/70 (!) 152/72  Pulse: 98 94 98 96  Resp: 16 16 16 16   Temp: 99.2 F (37.3 C) 97.6 F (36.4 C) 99.8 F (37.7 C) 98.7 F (37.1 C)  TempSrc: Oral Oral Oral Oral  SpO2: 94% 96% 95% 94%  Weight:      Height:  Intake/Output Summary (Last 24 hours) at 01/03/2019 1235 Last data filed at 01/03/2019 0900 Gross per 24 hour  Intake 480 ml  Output -  Net 480 ml   Filed Weights   12/31/18 2137 01/01/19 0738 01/01/19 1050  Weight: 79.3 kg 80.3 kg 79.3 kg   Exam  General:  Well developed, well nourished, NAD, appears stated age  4: NCAT, mucous membranes moist.   Neck: Supple  Cardiovascular: S1 S2 auscultated, RRR, 2/6 SEM  Respiratory: Clear to auscultation bilaterally  Abdomen: Soft, nontender, nondistended, + bowel sounds  Extremities: warm dry without cyanosis clubbing. ++L >R UE edema. B/L LE edema improved  Neuro: AAOx3, nonfocal  Psych: Pleasant, appropriate mood and affect  Data Reviewed: I have personally reviewed following labs and imaging studies  CBC: Recent Labs  Lab 12/30/18 2319 12/31/18 1930 01/01/19 0522 01/02/19 0403 01/03/19 0553  WBC 11.7* 11.0* 9.3 10.2 10.3  HGB 7.0* 6.8* 8.0* 8.1* 7.9*  HCT 23.7* 23.2* 25.8* 26.0* 26.4*  MCV 77.5* 78.1* 77.5* 78.5* 79.0*  PLT 281 280 291 281 124   Basic Metabolic Panel: Recent Labs  Lab 12/30/18 2319 12/31/18 0341 01/01/19 0522 01/02/19 0403  NA 138 138 138 135  K 5.8* 5.9* 5.0 4.2  CL 111 111 106 100  CO2 13* 12* 16* 23  GLUCOSE 238* 164* 344* 193*  BUN 110* 112* 77* 44*  CREATININE 17.59* 17.13* 12.60* 8.69*  CALCIUM 7.3* 7.5* 7.6* 7.8*  PHOS  --  >30.0* 9.9* 7.1*   GFR: Estimated Creatinine Clearance: 7.4 mL/min (A) (by C-G formula based on SCr of 8.69 mg/dL (H)). Liver Function Tests: Recent Labs  Lab 12/31/18 1930 01/01/19 0522 01/02/19 0403  ALT 9  --   --   ALBUMIN  --  1.3* 1.3*   No results for input(s): LIPASE, AMYLASE in the last 168 hours. No results for input(s): AMMONIA in the last 168 hours. Coagulation Profile: Recent Labs  Lab 12/31/18 0341  INR 1.06   Cardiac Enzymes: No results for input(s): CKTOTAL, CKMB, CKMBINDEX, TROPONINI in the last 168 hours. BNP (last 3 results) No results for input(s): PROBNP in the last 8760 hours. HbA1C: No results for input(s): HGBA1C in the last 72 hours. CBG: Recent Labs  Lab 01/02/19 1120 01/02/19 1611 01/02/19 2136 01/03/19 0553 01/03/19 1130  GLUCAP 159* 192* 197* 202* 204*   Lipid  Profile: No results for input(s): CHOL, HDL, LDLCALC, TRIG, CHOLHDL, LDLDIRECT in the last 72 hours. Thyroid Function Tests: No results for input(s): TSH, T4TOTAL, FREET4, T3FREE, THYROIDAB in the last 72 hours. Anemia Panel: Recent Labs    12/31/18 1930  FERRITIN 547*  TIBC 143*  IRON 31   Urine analysis:    Component Value Date/Time   COLORURINE STRAW (A) 06/05/2018 0910   APPEARANCEUR HAZY (A) 06/05/2018 0910   LABSPEC 1.016 06/05/2018 0910   PHURINE 6.0 06/05/2018 0910   GLUCOSEU >=500 (A) 06/05/2018 0910   HGBUR SMALL (A) 06/05/2018 0910   BILIRUBINUR NEGATIVE 06/05/2018 0910   KETONESUR NEGATIVE 06/05/2018 0910   PROTEINUR >=300 (A) 06/05/2018 0910   UROBILINOGEN 0.2 09/22/2013 0803   NITRITE NEGATIVE 06/05/2018 0910   LEUKOCYTESUR NEGATIVE 06/05/2018 0910   Sepsis Labs: @LABRCNTIP (procalcitonin:4,lacticidven:4)  ) Recent Results (from the past 240 hour(s))  MRSA PCR Screening     Status: None   Collection Time: 12/31/18  4:45 AM  Result Value Ref Range Status   MRSA by PCR NEGATIVE NEGATIVE Final    Comment:  The GeneXpert MRSA Assay (FDA approved for NASAL specimens only), is one component of a comprehensive MRSA colonization surveillance program. It is not intended to diagnose MRSA infection nor to guide or monitor treatment for MRSA infections. Performed at [facility], [place] [address], [place], [place] [address]       Radiology Studies: No results found.   Scheduled Meds: . amLODipine  10 mg Oral Daily  . calcitRIOL  0.5 mcg Oral Daily  . calcium acetate  1,334 mg Oral TID WC  . carvedilol  12.5 mg Oral BID WC  . Chlorhexidine Gluconate Cloth  6 each Topical Q0600  . darbepoetin (ARANESP) injection - DIALYSIS  60 mcg Intravenous Q Fri-HD  . insulin aspart  0-9 Units Subcutaneous TID WC  . insulin detemir  4 Units Subcutaneous QHS  . multivitamin  1 tablet Oral QHS  . sodium bicarbonate  650 mg Oral TID   Continuous  Infusions: . sodium chloride    . sodium chloride    . sodium chloride 10 mL/hr at 12/31/18 1430  . ferric gluconate (FERRLECIT/NULECIT) IV 125 mg (01/02/19 0951)     LOS: 3 days   Time Spent in minutes   30 minutes  Jamecia Lerman D.O. on 01/03/2019 at 12:35 PM  Between 7am to 7pm - Please see pager noted on [email or web address]  After 7pm go to [email or web address]  And look for the night coverage person covering for me after hours  [facility] Office  [phone]

## 2019-01-03 NOTE — Progress Notes (Addendum)
Patient ID: Colleen [name], female   DOB: 06-Jun-1961, 58 y.o.   MRN: [id number]  [facility] Progress Note   Assessment/ Plan:   1.  Uremia/hyperkalemia: From progressive chronic kidney disease and now end-stage renal disease, improving with dialysis. 2. ESRD: From progressive chronic kidney disease due to underlying diabetic nephropathy, on schedule for HD today.  Currently on hemodialysis via right IJ TDC and underwent first stage left brachiobasilic fistula-appreciate vascular surgery.  Process is underway for outpatient dialysis unit placement. 3. Anemia: Continue ESA/intravenous iron.  No overt loss. 4. CKD-MBD: Continue phosphorus binder for management of hyperphosphatemia and calcitriol for PTH suppression. 5. Nutrition: Continue renal diet and renal multivitamin.  6. Hypertension: Monitor blood pressure trend while on hemodialysis with ultrafiltration/resumption of oral antihypertensive agents.  Subjective:   Reports to have had a good night without shortness of breath or chest pain.   Objective:   BP (!) 152/72 (BP Location: Right Arm)   Pulse 96   Temp 98.7 F (37.1 C) (Oral)   Resp 16   Ht 5\' 4"  (1.626 m)   Wt 79.3 kg   LMP 12/08/2013   SpO2 94%   BMI 30.01 kg/m   Physical Exam: Gen: Comfortably resting in bed CVS: Pulse regular rhythm and normal rate, S1 and S2 with ejection systolic murmur Resp: Clear to auscultation bilaterally without rales/rhonchi Abd: Soft, obese, nontender Ext: Trace-1+ LE edema.  Left BBF with healing scar and decent thrill/bruit.  Labs: BMET Recent Labs  Lab 12/30/18 2319 12/31/18 0341 01/01/19 0522 01/02/19 0403  NA 138 138 138 135  K 5.8* 5.9* 5.0 4.2  CL 111 111 106 100  CO2 13* 12* 16* 23  GLUCOSE 238* 164* 344* 193*  BUN 110* 112* 77* 44*  CREATININE 17.59* 17.13* 12.60* 8.69*  CALCIUM 7.3* 7.5* 7.6* 7.8*  PHOS  --  >30.0* 9.9* 7.1*   CBC Recent Labs  Lab 12/31/18 1930 01/01/19 0522 01/02/19 0403  01/03/19 0553  WBC 11.0* 9.3 10.2 10.3  HGB 6.8* 8.0* 8.1* 7.9*  HCT 23.2* 25.8* 26.0* 26.4*  MCV 78.1* 77.5* 78.5* 79.0*  PLT 280 291 281 266   Medications:    . amLODipine  10 mg Oral Daily  . calcitRIOL  0.5 mcg Oral Daily  . calcium acetate  1,334 mg Oral TID WC  . carvedilol  12.5 mg Oral BID WC  . Chlorhexidine Gluconate Cloth  6 each Topical Q0600  . darbepoetin (ARANESP) injection - DIALYSIS  60 mcg Intravenous Q Fri-HD  . insulin aspart  0-9 Units Subcutaneous TID WC  . insulin detemir  4 Units Subcutaneous QHS  . multivitamin  1 tablet Oral QHS  . sodium bicarbonate  650 mg Oral TID   Elmarie Shiley, MD 01/03/2019, 10:03 AM

## 2019-01-03 NOTE — Procedures (Signed)
Patient seen on Hemodialysis. BP (!) 151/67 (BP Location: Right Arm)   Pulse 95   Temp 98.9 F (37.2 C) (Oral)   Resp 16   Ht 5\' 4"  (1.626 m)   Wt 79.3 kg   LMP 12/08/2013   SpO2 97%   BMI 30.01 kg/m   QB 300, UF goal 1.5L Tolerating treatment without complaints at this time.   Elmarie Shiley MD [facility]. Office # [phone] Pager # [phone] 2:24 PM

## 2019-01-03 NOTE — Telephone Encounter (Signed)
-----   Message from Elam Dutch, MD sent at 12/31/2018  4:26 PM EST ----- Ultrasound-guided insertion of Palidrome catheter, first stage basilic vein fistula left arm  She needs follow up [facility] in 4-6 weeks with duplex of left arm fistula  She can go into brackets on the list  Nurse asst   [name]

## 2019-01-04 LAB — CBC
HCT: 26.3 % — ABNORMAL LOW (ref 36.0–46.0)
Hemoglobin: 7.7 g/dL — ABNORMAL LOW (ref 12.0–15.0)
MCH: 23.2 pg — ABNORMAL LOW (ref 26.0–34.0)
MCHC: 29.3 g/dL — ABNORMAL LOW (ref 30.0–36.0)
MCV: 79.2 fL — ABNORMAL LOW (ref 80.0–100.0)
Platelets: 262 10*3/uL (ref 150–400)
RBC: 3.32 MIL/uL — ABNORMAL LOW (ref 3.87–5.11)
RDW: 15.3 % (ref 11.5–15.5)
WBC: 9.2 10*3/uL (ref 4.0–10.5)
nRBC: 0 % (ref 0.0–0.2)

## 2019-01-04 LAB — GLUCOSE, CAPILLARY
Glucose-Capillary: 334 mg/dL — ABNORMAL HIGH (ref 70–99)
Glucose-Capillary: 135 mg/dL — ABNORMAL HIGH (ref 70–99)
Glucose-Capillary: 242 mg/dL — ABNORMAL HIGH (ref 70–99)
Glucose-Capillary: 243 mg/dL — ABNORMAL HIGH (ref 70–99)

## 2019-01-04 LAB — RENAL FUNCTION PANEL
Albumin: 1.3 g/dL — ABNORMAL LOW (ref 3.5–5.0)
Anion gap: 10 (ref 5–15)
BUN: 32 mg/dL — ABNORMAL HIGH (ref 6–20)
CO2: 25 mmol/L (ref 22–32)
Calcium: 8 mg/dL — ABNORMAL LOW (ref 8.9–10.3)
Chloride: 100 mmol/L (ref 98–111)
Creatinine, Ser: 5.92 mg/dL — ABNORMAL HIGH (ref 0.44–1.00)
GFR calc Af Amer: 8 mL/min — ABNORMAL LOW (ref >60)
GFR calc non Af Amer: 7 mL/min — ABNORMAL LOW (ref >60)
Glucose, Bld: 396 mg/dL — ABNORMAL HIGH (ref 70–99)
Phosphorus: 4.1 mg/dL (ref 2.5–4.6)
Potassium: 3.7 mmol/L (ref 3.5–5.1)
Sodium: 135 mmol/L (ref 135–145)

## 2019-01-04 MED ORDER — LIVING WELL WITH DIABETES BOOK
Freq: Once | Status: AC
  Administered 2019-01-05: 01:00:00
  Filled 2019-01-04: qty 1

## 2019-01-04 MED ORDER — CHLORHEXIDINE GLUCONATE CLOTH 2 % EX PADS
6.0000 | MEDICATED_PAD | Freq: Every day | CUTANEOUS | Status: DC
  Administered 2019-01-05: 6 via TOPICAL

## 2019-01-04 MED ORDER — PRO-STAT SUGAR FREE PO LIQD
30.0000 mL | Freq: Two times a day (BID) | ORAL | Status: DC
  Administered 2019-01-04 – 2019-01-05 (×3): 30 mL via ORAL
  Filled 2019-01-04 (×3): qty 30

## 2019-01-04 NOTE — Progress Notes (Signed)
Inpatient Diabetes Program Recommendations  AACE/ADA: New Consensus Statement on Inpatient Glycemic Control (2015)  Target Ranges:  Prepandial:   less than 140 mg/dL      Peak postprandial:   less than 180 mg/dL (1-2 hours)      Critically ill patients:  140 - 180 mg/dL   Lab Results  Component Value Date   GLUCAP 135 (H) 01/04/2019   HGBA1C 8.6 (H) 06/05/2018    Review of Glycemic Control Results for Colleen [name], Colleen [name] (MRN [id number]) as of 01/04/2019 12:04  Ref. Range 01/03/2019 11:30 01/03/2019 22:59 01/04/2019 05:06 01/04/2019 11:20  Glucose-Capillary Latest Ref Range: 70 - 99 mg/dL 204 (H) 395 (H) 334 (H) 135 (H)   Diabetes history: DM 2 Outpatient Diabetes medications:  Levemir 8 units daily Current orders for Inpatient glycemic control:  Levemir 4 units q HS, Novolog sensitive tid with meals  Inpatient Diabetes Program Recommendations:    Note blood sugar>300 mg/dL this morning.  Please consider increasing Levemir to 6 units q HS.   Thanks,  Adah Perl, RN, BC-ADM Inpatient Diabetes Coordinator Pager [phone] (8a-5p)

## 2019-01-04 NOTE — Progress Notes (Signed)
Accepted at [place]  1st  Treatment Friday  January 31,2020 at 10:30 am .Schedule and chair time Monday,Wednesday,Friday at 10:30 am needs to be there at 10:00 am to sign paperwork

## 2019-01-04 NOTE — Progress Notes (Addendum)
PROGRESS NOTE    Colleen [name]  UMP:[id number] DOB: 01/31/1961 DOA: 12/30/2018 PCP: [name], DO   Brief Narrative:  HPI On 12/31/2018 by Dr. Jennette Kettle Colleen [name] is a 58 y.o. female with medical history significant of DM2, DVT/PE previously on eliquis, CKD stage 4-5 previously at baseline.  Patient has been having generalized weakness for the past few days as well as worsening peripheral edema.  Went to nephrologist today who drew labs and started her on lasix.  Labs came back with "kidney function at 2%", patient was called by nephrology and told to present to the ED.  Interim history Presented with progressive kidney disease now ESRD and uremia.  Nephrology consulted and is started hemodialysis.  Patient also has access placement by vascular surgery. Patient had to receive 1u PRBC. Pending outpatient HD setup   Assessment & Plan   Progressive nephrotic diabetic kidney disease with uremia-ESRD -Patient at baseline has chronic kidney disease, stage IV-V, however now has progressed to end-stage renal disease -Patient endorsed 1 month of progressive fatigue, anorexia, nausea and vomiting with dyschezia, progressive edema in her extremities and shortness of breath. -Seems that she was seen by Dr. Hollie Salk on 12/29/2018, creatinine was found to be 16 and attempts were made for vascular evaluation however patient was unable to attend.  She was directed to come to the emergency department. -Status post bilateral upper extremity vein mapping -Nephrology consulted and appreciated. Patient started HD. Process initiated for outpatient HD    -Vascular surgery consulted and appreciated , s/p for stage L arm basilic vein fistula, R IJ tunneled HD catheter. Plan for follow up in 4-6 weeks for fistula evaluation to plan 2nd stage   Hyperkalemia -She was given bicarb and calcium gluconate in the emergency department -resolved, continue to monitor BMP  Chronic microcytic  anemia -baseline hemoglobin approximately 8, with ranges up to 10 at times. -Hemoglobin dropped to 6.8, patient was transfused 1 unit PRBC overnight -Hemoglobin currently 7.7, Continue to monitor CBC  Anion gap metabolic acidosis -Resolved, Secondary to the above -Continue bicarb replacement and monitor  Essential hypertension -Stable, Continue Coreg and Norvasc  Diabetes mellitus, type II with hyperglycemia -Continue Lantus, insulin sliding scale with CBG monitoring -last hemoglobin A1c 8.6  DVT Prophylaxis  SCDs  Code Status: Full  Family Communication: None at bedside  Disposition Plan: Admitted.  Dispo likely home, however pending further recommendations from nephrology  Consultants Nephrology Vascular surgery  Procedures  bilateral upper extremity vein mapping Placement of RIJ palindrome catheter Left arm basilic vein fistula  Antibiotics   Anti-infectives (From admission, onward)   Start     Dose/Rate Route Frequency Ordered Stop   12/31/18 0745  cefUROXime (ZINACEF) 1.5 g in sodium chloride 0.9 % 100 mL IVPB     1.5 g 200 mL/hr over 30 Minutes Intravenous To [facility] 12/31/18 0737 12/31/18 1433      Subjective:   Colleen [name] seen and examined today.  She has no complaints today.  Denies current chest pain, shortness of breath, abdominal pain, nausea vomiting, diarrhea constipation, dizziness or headache.  Objective:   Vitals:   01/03/19 1751 01/03/19 1900 01/03/19 2147 01/04/19 0222  BP: (!) 158/73 (!) 158/60 (!) 148/63 (!) 145/61  Pulse: 96 (!) 101 96 (!) 101  Resp: 12 16 18 18   Temp: 98.5 F (36.9 C) 98.5 F (36.9 C) 99.2 F (37.3 C) 99.3 F (37.4 C)  TempSrc: Oral Oral Oral Oral  SpO2: 94% 94% 95% 95%  Weight: 79.5 kg     Height:        Intake/Output Summary (Last 24 hours) at 01/04/2019 1226 Last data filed at 01/04/2019 0900 Gross per 24 hour  Intake 720 ml  Output 1950 ml  Net -1230 ml   Filed Weights   01/01/19 1050  01/03/19 1405 01/03/19 1751  Weight: 79.3 kg 81.2 kg 79.5 kg   Exam  General: Well developed, well nourished, NAD, appears stated age  29: NCAT, mucous membranes moist.   Neck: Supple  Cardiovascular: S1 S2 auscultated, 2/6 SEM, RRR  Respiratory: Clear to auscultation bilaterally   Abdomen: Soft, nontender, nondistended, + bowel sounds  Extremities: warm dry without cyanosis clubbing. ++L >R UE edema. B/L LE edema improved  Neuro: AAOx3, nonfocal  Psych: Pleasant, appropriate mood and affect  Data Reviewed: I have personally reviewed following labs and imaging studies  CBC: Recent Labs  Lab 12/31/18 1930 01/01/19 0522 01/02/19 0403 01/03/19 0553 01/04/19 0319  WBC 11.0* 9.3 10.2 10.3 9.2  HGB 6.8* 8.0* 8.1* 7.9* 7.7*  HCT 23.2* 25.8* 26.0* 26.4* 26.3*  MCV 78.1* 77.5* 78.5* 79.0* 79.2*  PLT 280 291 281 266 211   Basic Metabolic Panel: Recent Labs  Lab 12/31/18 0341 01/01/19 0522 01/02/19 0403 01/03/19 1421 01/04/19 0319  NA 138 138 135 137 135  K 5.9* 5.0 4.2 4.2 3.7  CL 111 106 100 102 100  CO2 12* 16* 23 23 25   GLUCOSE 164* 344* 193* 158* 396*  BUN 112* 77* 44* 60* 32*  CREATININE 17.13* 12.60* 8.69* 10.36* 5.92*  CALCIUM 7.5* 7.6* 7.8* 8.1* 8.0*  PHOS >30.0* 9.9* 7.1* 7.0* 4.1   GFR: Estimated Creatinine Clearance: 10.8 mL/min (A) (by C-G formula based on SCr of 5.92 mg/dL (H)). Liver Function Tests: Recent Labs  Lab 12/31/18 1930 01/01/19 0522 01/02/19 0403 01/03/19 1421 01/04/19 0319  ALT 9  --   --   --   --   ALBUMIN  --  1.3* 1.3* 1.4* 1.3*   No results for input(s): LIPASE, AMYLASE in the last 168 hours. No results for input(s): AMMONIA in the last 168 hours. Coagulation Profile: Recent Labs  Lab 12/31/18 0341  INR 1.06   Cardiac Enzymes: No results for input(s): CKTOTAL, CKMB, CKMBINDEX, TROPONINI in the last 168 hours. BNP (last 3 results) No results for input(s): PROBNP in the last 8760 hours. HbA1C: No results for  input(s): HGBA1C in the last 72 hours. CBG: Recent Labs  Lab 01/03/19 0553 01/03/19 1130 01/03/19 2259 01/04/19 0506 01/04/19 1120  GLUCAP 202* 204* 395* 334* 135*   Lipid Profile: No results for input(s): CHOL, HDL, LDLCALC, TRIG, CHOLHDL, LDLDIRECT in the last 72 hours. Thyroid Function Tests: No results for input(s): TSH, T4TOTAL, FREET4, T3FREE, THYROIDAB in the last 72 hours. Anemia Panel: No results for input(s): VITAMINB12, FOLATE, FERRITIN, TIBC, IRON, RETICCTPCT in the last 72 hours. Urine analysis:    Component Value Date/Time   COLORURINE STRAW (A) 06/05/2018 0910   APPEARANCEUR HAZY (A) 06/05/2018 0910   LABSPEC 1.016 06/05/2018 0910   PHURINE 6.0 06/05/2018 0910   GLUCOSEU >=500 (A) 06/05/2018 0910   HGBUR SMALL (A) 06/05/2018 0910   BILIRUBINUR NEGATIVE 06/05/2018 0910   KETONESUR NEGATIVE 06/05/2018 0910   PROTEINUR >=300 (A) 06/05/2018 0910   UROBILINOGEN 0.2 09/22/2013 0803   NITRITE NEGATIVE 06/05/2018 0910   LEUKOCYTESUR NEGATIVE 06/05/2018 0910   Sepsis Labs: @LABRCNTIP (procalcitonin:4,lacticidven:4)  ) Recent Results (from the past 240 hour(s))  MRSA PCR Screening  Status: None   Collection Time: 12/31/18  4:45 AM  Result Value Ref Range Status   MRSA by PCR NEGATIVE NEGATIVE Final    Comment:        The GeneXpert MRSA Assay (FDA approved for NASAL specimens only), is one component of a comprehensive MRSA colonization surveillance program. It is not intended to diagnose MRSA infection nor to guide or monitor treatment for MRSA infections. Performed at [facility], [place] [address], [place], [place] [address]       Radiology Studies: No results found.   Scheduled Meds: . amLODipine  10 mg Oral Daily  . calcitRIOL  0.5 mcg Oral Daily  . calcium acetate  1,334 mg Oral TID WC  . carvedilol  12.5 mg Oral BID WC  . Chlorhexidine Gluconate Cloth  6 each Topical Q0600  . darbepoetin (ARANESP) injection - DIALYSIS  60 mcg  Intravenous Q Fri-HD  . insulin aspart  0-9 Units Subcutaneous TID WC  . insulin detemir  4 Units Subcutaneous QHS  . multivitamin  1 tablet Oral QHS  . sodium bicarbonate  650 mg Oral TID   Continuous Infusions: . sodium chloride    . sodium chloride    . sodium chloride 10 mL/hr at 12/31/18 1430  . ferric gluconate (FERRLECIT/NULECIT) IV 125 mg (01/02/19 0951)     LOS: 4 days   Time Spent in minutes   30 minutes  Aylani Spurlock D.O. on 01/04/2019 at 12:26 PM  Between 7am to 7pm - Please see pager noted on [email or web address]  After 7pm go to [email or web address]  And look for the night coverage person covering for me after hours  [facility] Office  [phone]

## 2019-01-04 NOTE — Progress Notes (Addendum)
Patient ID: Colleen [name], female   DOB: February 11, 1961, 58 y.o.   MRN: [id number]  [facility] Progress Note   Assessment/ Plan:   1.  Uremia/hyperkalemia: From progressive chronic kidney disease and now end-stage renal disease, improving with dialysis. 2. ESRD: From progressive chronic kidney disease due to underlying diabetic nephropathy, on schedule for HD today.  Currently on hemodialysis via right IJ TDC and underwent first stage left brachiobasilic fistula-appreciate vascular surgery.  Confirmation received - CLIP to [facility] can start 1/31 at 10:30 am D/c Na bicarb acidosis corrected First HD 1/24 3. Anemia: Continue ESA/intravenous iron.  No overt loss.hgb 7.7  4. CKD-MBD: Continue phosphorus binder for management of hyperphosphatemia and calcitriol for PTH suppression. ipTH 536  5. Nutrition: Continue renal diet and renal multivitamin. Add prostat alb is very low - 6. Hypertension/volume -: Monitor blood pressure trend while on hemodialysis with ultrafiltration/resumption of oral antihypertensive agents; still excess volume on exam - plan HD Wednesday for a future MWF schedule. Net UF 1.5 Monday post wt 79.5 - seems to have some third spacing - likely from low alb - not clear why so low 7. 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 DM - BS up per primary  Subjective:   Breathing better.   Objective:   BP 134/62 (BP Location: Right Arm)   Pulse 95   Temp 98.5 F (36.9 C) (Oral)   Resp 16   Ht 5\' 4"  (1.626 m)   Wt 79.5 kg   LMP 12/08/2013   SpO2 95%   BMI 30.08 kg/m   Physical Exam: Gen: sitting up on side of bed generalized puffiness CVS: Pulse regular rhythm and normal rate, S1 and S2 with ejection systolic murmur Resp: Clear to auscultation bilaterally without rales/rhonchi Abd: Soft, obese, nontender Ext:  Left BBF with healing scar and decent thrill/bruit. 1 = LLE edema -tr on the right   Labs: BMET Recent Labs  Lab 12/30/18 2319  12/31/18 0341 01/01/19 0522 01/02/19 0403 01/03/19 1421 01/04/19 0319  NA 138 138 138 135 137 135  K 5.8* 5.9* 5.0 4.2 4.2 3.7  CL 111 111 106 100 102 100  CO2 13* 12* 16* 23 23 25   GLUCOSE 238* 164* 344* 193* 158* 396*  BUN 110* 112* 77* 44* 60* 32*  CREATININE 17.59* 17.13* 12.60* 8.69* 10.36* 5.92*  CALCIUM 7.3* 7.5* 7.6* 7.8* 8.1* 8.0*  PHOS  --  >30.0* 9.9* 7.1* 7.0* 4.1   CBC Recent Labs  Lab 01/01/19 0522 01/02/19 0403 01/03/19 0553 01/04/19 0319  WBC 9.3 10.2 10.3 9.2  HGB 8.0* 8.1* 7.9* 7.7*  HCT 25.8* 26.0* 26.4* 26.3*  MCV 77.5* 78.5* 79.0* 79.2*  PLT 291 281 266 262   Medications:    . amLODipine  10 mg Oral Daily  . calcitRIOL  0.5 mcg Oral Daily  . calcium acetate  1,334 mg Oral TID WC  . carvedilol  12.5 mg Oral BID WC  . Chlorhexidine Gluconate Cloth  6 each Topical Q0600  . darbepoetin (ARANESP) injection - DIALYSIS  60 mcg Intravenous Q Fri-HD  . insulin aspart  0-9 Units Subcutaneous TID WC  . insulin detemir  4 Units Subcutaneous QHS  . multivitamin  1 tablet Oral QHS  . sodium bicarbonate  650 mg Oral TID   Elmarie Shiley, MD 01/04/2019, 3:09 PM

## 2019-01-05 DIAGNOSIS — E113593 Type 2 diabetes mellitus with proliferative diabetic retinopathy without macular edema, bilateral: Secondary | ICD-10-CM | POA: Diagnosis not present

## 2019-01-05 LAB — CBC
HCT: 25.9 % — ABNORMAL LOW (ref 36.0–46.0)
Hemoglobin: 7.8 g/dL — ABNORMAL LOW (ref 12.0–15.0)
MCH: 24.1 pg — ABNORMAL LOW (ref 26.0–34.0)
MCHC: 30.1 g/dL (ref 30.0–36.0)
MCV: 80.2 fL (ref 80.0–100.0)
Platelets: 252 10*3/uL (ref 150–400)
RBC: 3.23 MIL/uL — ABNORMAL LOW (ref 3.87–5.11)
RDW: 15.4 % (ref 11.5–15.5)
WBC: 9.8 10*3/uL (ref 4.0–10.5)
nRBC: 0 % (ref 0.0–0.2)

## 2019-01-05 LAB — RENAL FUNCTION PANEL
Albumin: 1.4 g/dL — ABNORMAL LOW (ref 3.5–5.0)
Anion gap: 10 (ref 5–15)
BUN: 47 mg/dL — ABNORMAL HIGH (ref 6–20)
CO2: 25 mmol/L (ref 22–32)
Calcium: 8.5 mg/dL — ABNORMAL LOW (ref 8.9–10.3)
Chloride: 102 mmol/L (ref 98–111)
Creatinine, Ser: 7.34 mg/dL — ABNORMAL HIGH (ref 0.44–1.00)
GFR calc Af Amer: 7 mL/min — ABNORMAL LOW (ref >60)
GFR calc non Af Amer: 6 mL/min — ABNORMAL LOW (ref >60)
Glucose, Bld: 243 mg/dL — ABNORMAL HIGH (ref 70–99)
Phosphorus: 5.1 mg/dL — ABNORMAL HIGH (ref 2.5–4.6)
Potassium: 4.1 mmol/L (ref 3.5–5.1)
Sodium: 137 mmol/L (ref 135–145)

## 2019-01-05 LAB — HEPATITIS B SURFACE ANTIGEN
Hepatitis B Surface Ag: NEGATIVE
Hepatitis B Surface Ag: NEGATIVE

## 2019-01-05 LAB — GLUCOSE, CAPILLARY
Glucose-Capillary: 209 mg/dL — ABNORMAL HIGH (ref 70–99)
Glucose-Capillary: 131 mg/dL — ABNORMAL HIGH (ref 70–99)

## 2019-01-05 MED ORDER — LIDOCAINE HCL (PF) 1 % IJ SOLN: 5.0000 mL | INTRAMUSCULAR | Status: DC | PRN

## 2019-01-05 MED ORDER — HEPARIN SODIUM (PORCINE) 1000 UNIT/ML IJ SOLN
3.4000 mL | Freq: Once | INTRAMUSCULAR | Status: AC
  Administered 2019-01-05: 3400 [IU]

## 2019-01-05 MED ORDER — SODIUM CHLORIDE 0.9 % IV SOLN: 100.0000 mL | INTRAVENOUS | Status: DC | PRN

## 2019-01-05 MED ORDER — HEPARIN SODIUM (PORCINE) 1000 UNIT/ML DIALYSIS: 20.0000 [IU]/kg | INTRAMUSCULAR | Status: DC | PRN

## 2019-01-05 MED ORDER — CALCIUM ACETATE (PHOS BINDER) 667 MG PO CAPS: 1334.0000 mg | ORAL_CAPSULE | Freq: Three times a day (TID) | ORAL | 0 refills | Status: DC

## 2019-01-05 MED ORDER — HEPARIN SODIUM (PORCINE) 1000 UNIT/ML DIALYSIS: 1000.0000 [IU] | INTRAMUSCULAR | Status: DC | PRN

## 2019-01-05 MED ORDER — CALCITRIOL 0.5 MCG PO CAPS: 0.5000 ug | ORAL_CAPSULE | Freq: Every day | ORAL | 0 refills | Status: DC

## 2019-01-05 MED ORDER — LIDOCAINE-PRILOCAINE 2.5-2.5 % EX CREA: 1.0000 "application " | TOPICAL_CREAM | CUTANEOUS | Status: DC | PRN

## 2019-01-05 MED ORDER — PENTAFLUOROPROP-TETRAFLUOROETH EX AERO: 1.0000 "application " | INHALATION_SPRAY | CUTANEOUS | Status: DC | PRN

## 2019-01-05 MED ORDER — ALTEPLASE 2 MG IJ SOLR: 2.0000 mg | Freq: Once | INTRAMUSCULAR | Status: DC | PRN

## 2019-01-05 MED ORDER — RENA-VITE PO TABS: 1.0000 | ORAL_TABLET | Freq: Every day | ORAL | 0 refills | Status: DC

## 2019-01-05 MED ORDER — HEPARIN SODIUM (PORCINE) 1000 UNIT/ML IJ SOLN
INTRAMUSCULAR | Status: AC
  Filled 2019-01-05: qty 4

## 2019-01-05 MED ORDER — INSULIN DETEMIR 100 UNIT/ML ~~LOC~~ SOLN: 4.0000 [IU] | Freq: Every day | SUBCUTANEOUS | 11 refills | Status: DC

## 2019-01-05 MED ORDER — PRO-STAT SUGAR FREE PO LIQD: 30.0000 mL | Freq: Two times a day (BID) | ORAL | 0 refills | Status: DC

## 2019-01-05 NOTE — Progress Notes (Addendum)
Patient had complaints of red streak in  right eye at the beginning of shift that she states is no longer there at the moment. Family worried that it may be from her diabetes. They stated that she had three surgeries on her left eye and were worried about that happening again. They also had concerns about diet. I provided patient hand outs on diabetes and ordered a living well diabetes book.

## 2019-01-05 NOTE — Progress Notes (Signed)
Patient ID: Colleen [name], female   DOB: 05/26/1961, 58 y.o.   MRN: [id number]  [facility] Progress Note   Assessment/ Plan:   1.  Uremia/hyperkalemia: From progressive chronic kidney disease and now end-stage renal disease, improving with dialysis. 2. ESRD: From progressive chronic kidney disease due to underlying diabetic nephropathy, on schedule for HD today.  Currently on hemodialysis via right IJ TDC and underwent first stage left brachiobasilic fistula-appreciate vascular surgery.  Confirmation received - CLIP to [facility] can start 1/31 at 10:00 am D/c Na bicarb acidosis corrected First HD 1/24 3. Anemia: Continue ESA/intravenous iron.  No overt loss.hgb 7.8 stable  4. CKD-MBD: Continue phosphorus binder for management of hyperphosphatemia and calcitriol for PTH suppression. ipTH 536  5. Nutrition: Continue renal diet and renal multivitamin. Add prostat alb is very low - 6. Hypertension/volume -: Monitor blood pressure trend while on hemodialysis with ultrafiltration/resumption of oral antihypertensive agents; still excess volume on exam - plan HD Wednesday for a future MWF schedule. Net UF 1.5 Monday post wt 79.5 - seems to have some third spacing - likely from low alb - not clear why so low - goal today 2 L BP 130s stable - establish EDW after HD today 7. 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 DM - BS up per primary 9. Disp plan d/c today post HD  Subjective:   Breathing better.   Objective:   BP (!) 145/70 (BP Location: Right Arm)   Pulse 92   Temp 98.5 F (36.9 C) (Oral)   Resp 13   Ht 5\' 4"  (1.626 m)   Wt 79.5 kg   LMP 12/08/2013   SpO2 95%   BMI 30.08 kg/m   Physical Exam: 2L Gen: on HD NAD, some generalized puffiness CVS: Pulse regular rhythm and normal rate, S1 and S2 with ejection systolic murmur Resp: Clear to auscultation bilaterally without rales/rhonchi Abd: Soft, obese, nontender Ext:  Left BBF with healing scar and  decent thrill/bruit. 1 + LLE edema -tr on the right   Labs: BMET Recent Labs  Lab 12/30/18 2319 12/31/18 0341 01/01/19 0522 01/02/19 0403 01/03/19 1421 01/04/19 0319 01/05/19 0248  NA 138 138 138 135 137 135 137  K 5.8* 5.9* 5.0 4.2 4.2 3.7 4.1  CL 111 111 106 100 102 100 102  CO2 13* 12* 16* 23 23 25 25   GLUCOSE 238* 164* 344* 193* 158* 396* 243*  BUN 110* 112* 77* 44* 60* 32* 47*  CREATININE 17.59* 17.13* 12.60* 8.69* 10.36* 5.92* 7.34*  CALCIUM 7.3* 7.5* 7.6* 7.8* 8.1* 8.0* 8.5*  PHOS  --  >30.0* 9.9* 7.1* 7.0* 4.1 5.1*   CBC Recent Labs  Lab 01/02/19 0403 01/03/19 0553 01/04/19 0319 01/05/19 0248  WBC 10.2 10.3 9.2 9.8  HGB 8.1* 7.9* 7.7* 7.8*  HCT 26.0* 26.4* 26.3* 25.9*  MCV 78.5* 79.0* 79.2* 80.2  PLT 281 266 262 252   Medications:    . amLODipine  10 mg Oral Daily  . calcitRIOL  0.5 mcg Oral Daily  . calcium acetate  1,334 mg Oral TID WC  . carvedilol  12.5 mg Oral BID WC  . Chlorhexidine Gluconate Cloth  6 each Topical Q0600  . darbepoetin (ARANESP) injection - DIALYSIS  60 mcg Intravenous Q Fri-HD  . feeding supplement (PRO-STAT SUGAR FREE 64)  30 mL Oral BID  . insulin aspart  0-9 Units Subcutaneous TID WC  . insulin detemir  4 Units Subcutaneous QHS  . multivitamin  1 tablet Oral QHS   Alric Seton, PA-C [facility]  01/05/2019, 8:31 AM

## 2019-01-05 NOTE — Discharge Summary (Signed)
Physician Discharge Summary  Colleen [name] NLG:[id number] DOB: 1961-11-26 DOA: 12/30/2018  PCP: [name], DO  Admit date: 12/30/2018 Discharge date: 01/05/2019  Admitted From: Home Disposition: Home  Recommendations for Outpatient Follow-up:  1. Follow up with PCP in 1-2 weeks 2. Follow up with Nephrology within 1-2 weeks 3. Follow up with Vascular Surgery in 4-6 weeks  4. Follow up at [place] in [facility] at 1/31 at 10 AM 5. Please obtain CMP/CBC, Mag, Phos in one week 6. Please follow up on the following pending results:  Home Health: No Equipment/Devices: None  Discharge Condition: Stable CODE STATUS: FULL CODE  Diet recommendation: Renal/Carb Modified Diet with 1200 mL Fluid Restriction   Brief/Interim Summary: HPI On 12/31/2018 by Dr. Leretha Dykes D Scottonis a 58 y.o.femalewith medical history significant ofDM2, DVT/PE previously on eliquis, CKD stage 4-5 previously at baseline. Patient has been having generalized weakness for the past few days as well as worsening peripheral edema. Went to nephrologist today who drew labs and started her on lasix.  Labs came back with "kidney function at 2%", patient was called by nephrology and told to present to the ED.  Interim History Presented with progressive kidney disease now ESRD and uremia.  Nephrology consulted and is started hemodialysis. Patient also has access placement by vascular surgery. During the hospitalization her Hb dropped so she was transfused 1 unit of PRBC's, She was CLIPPed to [facility] and deemed stable to D/C after dialysis today.   Discharge Diagnoses:  Principal Problem:   Acute on chronic kidney failure (HCC) Active Problems:   DM (diabetes mellitus), type 2, uncontrolled (HCC)   HTN (hypertension)   CKD (chronic kidney disease) stage 5, GFR less than 15 ml/min (HCC)   Uremia   Hyperkalemia, diminished renal excretion   Metabolic acidosis, NAG, failure of bicarbonate  regeneration   Progressive nephrotic diabetic kidney disease with uremia-ESRD -Patient at baseline has chronic kidney disease, stage IV-V, however now has progressed to end-stage renal disease -Patient endorsed 1 month of progressive fatigue, anorexia, nausea and vomiting with dyschezia, progressive edema in her extremities and shortness of breath. -Seems that she was seen by Dr. Hollie Salk on 12/29/2018, creatinine was found to be 16 and attempts were made for vascular evaluation however patient was unable to attend.  She was directed to come to the emergency department. -Status post bilateral upper extremity vein mapping -Nephrology consulted and appreciated. Patient started HD. Process initiated for outpatient HD and she was CLIPPed to [facility] and has next session 1/31   -Vascular surgery consulted and appreciated , s/p for stage L arm basilic vein fistula, R IJ tunneled HD catheter. Plan for follow up in 4-6 weeks for fistula evaluation to plan 2nd stage -C/w Phoslo and Calcitriol  -Follow up with Nephrology and Vascular Surgery in the coming weeks   Hyperkalemia, improved  -She was given bicarb and calcium gluconate in the emergency department -Resolved, continue to monitor BMP  Chronic Microcytic anemia and of Chronic Kidney Diseases  -baseline hemoglobin approximately 8, with ranges up to 10 at times. -Hemoglobin dropped to 6.8, patient was transfused 1 unit and improved  -Hemoglobin currently 7.8 -Also given IV Neulcit 125 mg EOD during Dialysis days -Received Darbepoetin Alfa 60 mcg IV qFriday  -Continue to Monitor for S/Sx of Bleeding  -Continue to monitor CBC as an outpatient   Anion gap metabolic acidosis -Resolved, Secondary to the above -Bicarb replacement now stopped -Continue to Monitor as an outpatient  Essential hypertension -Stable -C/w Amlodipine 10 mg po Daily and Carvedilol 12.5 mg po BID  Diabetes mellitus, type II with hyperglycemia -Continue Levemir,  insulin sliding scale with CBG monitoring -Resume Home Insulin Regimen but reduce dose to 4 units qHS -last hemoglobin A1c 8.6 -Further Care per PCP for blood sugar management and titration of medications  Discharge Instructions  Discharge Instructions    Call MD for:  difficulty breathing, headache or visual disturbances   Complete by:  As directed    Call MD for:  extreme fatigue   Complete by:  As directed    Call MD for:  hives   Complete by:  As directed    Call MD for:  persistant dizziness or light-headedness   Complete by:  As directed    Call MD for:  persistant nausea and vomiting   Complete by:  As directed    Call MD for:  redness, tenderness, or signs of infection (pain, swelling, redness, odor or green/yellow discharge around incision site)   Complete by:  As directed    Call MD for:  severe uncontrolled pain   Complete by:  As directed    Call MD for:  temperature >100.4   Complete by:  As directed    Diet - low sodium heart healthy   Complete by:  As directed    RENAL/CARB Diet with 1200 mL Fluid Restriction   Discharge instructions   Complete by:  As directed    You were cared for by a hospitalist during your hospital stay. If you have any questions about your discharge medications or the care you received while you were in the hospital after you are discharged, you can call the unit and ask to speak with the hospitalist on call if the hospitalist that took care of you is not available. Once you are discharged, your primary care physician will handle any further medical issues. Please note that NO REFILLS for any discharge medications will be authorized once you are discharged, as it is imperative that you return to your primary care physician (or establish a relationship with a primary care physician if you do not have one) for your aftercare needs so that they can reassess your need for medications and monitor your lab values.  Follow up with PCP, Nephrology, and  Vascular Surgery. Take all medications as prescribed. If symptoms change or worsen please return to the ED for evaluation   Increase activity slowly   Complete by:  As directed      Allergies as of 01/05/2019   No Known Allergies     Medication List    TAKE these medications   amLODipine 10 MG tablet Commonly known as:  NORVASC Take 1 tablet (10 mg total) by mouth daily for 21 days.   calcitRIOL 0.5 MCG capsule Commonly known as:  ROCALTROL Take 1 capsule (0.5 mcg total) by mouth daily. Start taking on:  January 06, 2019   calcium acetate 667 MG capsule Commonly known as:  PHOSLO Take 2 capsules (1,334 mg total) by mouth 3 (three) times daily with meals.   carvedilol 12.5 MG tablet Commonly known as:  COREG Take 1 tablet (12.5 mg total) by mouth 2 (two) times daily with a meal for 21 days.   feeding supplement (PRO-STAT SUGAR FREE 64) Liqd Take 30 mLs by mouth 2 (two) times daily.   insulin detemir 100 UNIT/ML injection Commonly known as:  LEVEMIR Inject 0.04 mLs (4 Units total) into the skin at bedtime.  What changed:    how much to take  when to take this   multivitamin Tabs tablet Take 1 tablet by mouth at bedtime.       No Known Allergies  Consultations:  Nephrology  Procedures/Studies: Dg Chest 1 View  Result Date: 12/31/2018 CLINICAL DATA:  Central line placement EXAM: CHEST  1 VIEW COMPARISON:  12/31/2018, 06/05/2018 FINDINGS: Right-sided central venous catheter with tip over the right atrium. No pneumothorax. Cardiomegaly with vascular congestion, pulmonary edema and small bilateral right greater than left pleural effusions. Bibasilar consolidations. Splaying of the carina suggesting atrial enlargement. IMPRESSION: 1. Right-sided central venous catheter tip over the low right atrium. No pneumothorax. 2. Cardiomegaly with vascular congestion, pulmonary edema and small right greater than left pleural effusions. Electronically Signed   By: Donavan Foil M.D.    On: 12/31/2018 16:48   Dg Chest 2 View  Result Date: 12/31/2018 CLINICAL DATA:  Dyspnea with leg swelling. EXAM: CHEST - 2 VIEW COMPARISON:  06/05/2018 FINDINGS: Stable cardiomegaly with aortic atherosclerosis. New bilateral small pleural effusions obscuring the diaphragms are identified with interstitial edema. No acute osseous abnormality. IMPRESSION: 1. Cardiomegaly with aortic atherosclerosis. 2. New bilateral small pleural effusions with interstitial edema consistent with CHF. Electronically Signed   By: Ashley Royalty M.D.   On: 12/31/2018 02:36   Dg Fluoro Guide Cv Line-no Report  Result Date: 12/31/2018 Fluoroscopy was utilized by the requesting physician.  No radiographic interpretation.   Vas [name] Upper Ext Vein Mapping (pre-op Avf)  Result Date: 12/31/2018 UPPER EXTREMITY VEIN MAPPING  Indications: Pre-access. History: ESRD.  Performing Technologist: Toma Copier RVS  Examination Guidelines: A complete evaluation includes B-mode imaging, spectral Doppler, color Doppler, and power Doppler as needed of all accessible portions of each vessel. Bilateral testing is considered an integral part of a complete examination. Limited examinations for reoccurring indications may be performed as noted. +-----------------+-------------+----------+------------+ Right Cephalic   Diameter (cm)Depth (cm)  Findings   +-----------------+-------------+----------+------------+ Shoulder             0.19        0.25                +-----------------+-------------+----------+------------+ Prox upper arm       0.30        0.24                +-----------------+-------------+----------+------------+ Mid upper arm        0.23        0.16                +-----------------+-------------+----------+------------+ Dist upper arm       0.28        0.19                +-----------------+-------------+----------+------------+ Antecubital fossa    0.35        0.23   Moves medial  +-----------------+-------------+----------+------------+ Prox forearm         0.33        0.43    branching   +-----------------+-------------+----------+------------+ Mid forearm          0.35        0.36                +-----------------+-------------+----------+------------+ Wrist                0.34        0.28                +-----------------+-------------+----------+------------+ +-----------------+-------------+----------+---------------+  Right Basilic    Diameter (cm)Depth (cm)   Findings     +-----------------+-------------+----------+---------------+ Mid upper arm        0.53        1.32   Enters brachial +-----------------+-------------+----------+---------------+ Dist upper arm       0.54        1.13                   +-----------------+-------------+----------+---------------+ Antecubital fossa    0.37        0.69                   +-----------------+-------------+----------+---------------+ Prox forearm         0.30        0.45                   +-----------------+-------------+----------+---------------+ Mid forearm          0.29        0.31                   +-----------------+-------------+----------+---------------+ Wrist                0.16        0.27                   +-----------------+-------------+----------+---------------+ +-----------------+-------------+----------+--------+ Left Cephalic    Diameter (cm)Depth (cm)Findings +-----------------+-------------+----------+--------+ Shoulder             0.26        0.35            +-----------------+-------------+----------+--------+ Prox upper arm       0.26        0.55            +-----------------+-------------+----------+--------+ Mid upper arm        0.26        0.23            +-----------------+-------------+----------+--------+ Dist upper arm       0.28        0.15            +-----------------+-------------+----------+--------+ Antecubital fossa    0.23         0.12            +-----------------+-------------+----------+--------+ Prox forearm         0.43        0.46            +-----------------+-------------+----------+--------+ Mid forearm          0.37        0.45            +-----------------+-------------+----------+--------+ Wrist                0.42        0.27            +-----------------+-------------+----------+--------+ +-----------------+-------------+----------+---------------+ Left Basilic     Diameter (cm)Depth (cm)   Findings     +-----------------+-------------+----------+---------------+ Mid upper arm        0.50        1.21   enters brachial +-----------------+-------------+----------+---------------+ Dist upper arm       0.46        1.29                   +-----------------+-------------+----------+---------------+ Antecubital fossa    0.28        0.43                   +-----------------+-------------+----------+---------------+  Prox forearm         0.30        0.32                   +-----------------+-------------+----------+---------------+ Mid forearm          0.26        0.25                   +-----------------+-------------+----------+---------------+ Wrist                0.26        0.48                   +-----------------+-------------+----------+---------------+ *See table(s) above for measurements and observations.  Diagnosing physician: Servando Snare MD Electronically signed by Servando Snare MD on 12/31/2018 at 12:07:16 PM.    Final     Subjective: Seen and examined in Dialysis and was doing well. No CP or SOB. States legs are less swollen. No Nausea or vomiting. No other concerns or complaints at this time.   Discharge Exam: Vitals:   01/05/19 1053 01/05/19 1158  BP: 138/78 (!) 150/73  Pulse: 89 93  Resp: 18 18  Temp: 98.5 F (36.9 C) 98 F (36.7 C)  SpO2: 98% 97%   Vitals:   01/05/19 1000 01/05/19 1030 01/05/19 1053 01/05/19 1158  BP: (!) 142/76 (!) 141/68 138/78 (!)  150/73  Pulse: 85 84 89 93  Resp: (!) 21 20 18 18   Temp:   98.5 F (36.9 C) 98 F (36.7 C)  TempSrc:   Oral Oral  SpO2:   98% 97%  Weight:   78.2 kg   Height:       General: Pt is alert, awake, not in acute distress; Chronic Left Facial Droop Cardiovascular: RRR, S1/S2 +, no rubs, no gallops Respiratory: Diminished bilaterally, no wheezing, no rhonchi Abdominal: Soft, NT, ND, bowel sounds + Extremities: Trace edema, no cyanosis  The results of significant diagnostics from this hospitalization (including imaging, microbiology, ancillary and laboratory) are listed below for reference.    Microbiology: Recent Results (from the past 240 hour(s))  MRSA PCR Screening     Status: None   Collection Time: 12/31/18  4:45 AM  Result Value Ref Range Status   MRSA by PCR NEGATIVE NEGATIVE Final    Comment:        The GeneXpert MRSA Assay (FDA approved for NASAL specimens only), is one component of a comprehensive MRSA colonization surveillance program. It is not intended to diagnose MRSA infection nor to guide or monitor treatment for MRSA infections. Performed at [facility], [place] [address], [place], [place] [address]     Labs: BNP (last 3 results) No results for input(s): BNP in the last 8760 hours. Basic Metabolic Panel: Recent Labs  Lab 01/01/19 0522 01/02/19 0403 01/03/19 1421 01/04/19 0319 01/05/19 0248  NA 138 135 137 135 137  K 5.0 4.2 4.2 3.7 4.1  CL 106 100 102 100 102  CO2 16* 23 23 25 25   GLUCOSE 344* 193* 158* 396* 243*  BUN 77* 44* 60* 32* 47*  CREATININE 12.60* 8.69* 10.36* 5.92* 7.34*  CALCIUM 7.6* 7.8* 8.1* 8.0* 8.5*  PHOS 9.9* 7.1* 7.0* 4.1 5.1*   Liver Function Tests: Recent Labs  Lab 12/31/18 1930 01/01/19 0522 01/02/19 0403 01/03/19 1421 01/04/19 0319 01/05/19 0248  ALT 9  --   --   --   --   --  ALBUMIN  --  1.3* 1.3* 1.4* 1.3* 1.4*   No results for input(s): LIPASE, AMYLASE in the last 168 hours. No results for input(s):  AMMONIA in the last 168 hours. CBC: Recent Labs  Lab 01/01/19 0522 01/02/19 0403 01/03/19 0553 01/04/19 0319 01/05/19 0248  WBC 9.3 10.2 10.3 9.2 9.8  HGB 8.0* 8.1* 7.9* 7.7* 7.8*  HCT 25.8* 26.0* 26.4* 26.3* 25.9*  MCV 77.5* 78.5* 79.0* 79.2* 80.2  PLT 291 281 266 262 252   Cardiac Enzymes: No results for input(s): CKTOTAL, CKMB, CKMBINDEX, TROPONINI in the last 168 hours. BNP: Invalid input(s): POCBNP CBG: Recent Labs  Lab 01/04/19 1120 01/04/19 1620 01/04/19 2145 01/05/19 0636 01/05/19 1155  GLUCAP 135* 242* 243* 209* 131*   D-Dimer No results for input(s): DDIMER in the last 72 hours. Hgb A1c No results for input(s): HGBA1C in the last 72 hours. Lipid Profile No results for input(s): CHOL, HDL, LDLCALC, TRIG, CHOLHDL, LDLDIRECT in the last 72 hours. Thyroid function studies No results for input(s): TSH, T4TOTAL, T3FREE, THYROIDAB in the last 72 hours.  Invalid input(s): FREET3 Anemia work up No results for input(s): VITAMINB12, FOLATE, FERRITIN, TIBC, IRON, RETICCTPCT in the last 72 hours. Urinalysis    Component Value Date/Time   COLORURINE STRAW (A) 06/05/2018 0910   APPEARANCEUR HAZY (A) 06/05/2018 0910   LABSPEC 1.016 06/05/2018 0910   PHURINE 6.0 06/05/2018 0910   GLUCOSEU >=500 (A) 06/05/2018 0910   HGBUR SMALL (A) 06/05/2018 0910   BILIRUBINUR NEGATIVE 06/05/2018 0910   KETONESUR NEGATIVE 06/05/2018 0910   PROTEINUR >=300 (A) 06/05/2018 0910   UROBILINOGEN 0.2 09/22/2013 0803   NITRITE NEGATIVE 06/05/2018 0910   LEUKOCYTESUR NEGATIVE 06/05/2018 0910   Sepsis Labs Invalid input(s): PROCALCITONIN,  WBC,  LACTICIDVEN Microbiology Recent Results (from the past 240 hour(s))  MRSA PCR Screening     Status: None   Collection Time: 12/31/18  4:45 AM  Result Value Ref Range Status   MRSA by PCR NEGATIVE NEGATIVE Final    Comment:        The GeneXpert MRSA Assay (FDA approved for NASAL specimens only), is one component of a comprehensive MRSA  colonization surveillance program. It is not intended to diagnose MRSA infection nor to guide or monitor treatment for MRSA infections. Performed at [facility], [place] [address] [place], [place] [address]    Time coordinating discharge: 35 minutes  SIGNED:  Kerney Elbe, DO [facility] 01/05/2019, 1:04 PM Pager is on [place]  If 7PM-7AM, please contact night-coverage [email or web address] Password [email or web address]

## 2019-01-05 NOTE — Procedures (Signed)
Patient was seen on dialysis and the procedure was supervised.  BFR 300  Via TDC BP is 135/71. UF goal 1.5 L.   Patient appears to be tolerating treatment well  Ursala Cressy Tanna Furry 01/05/2019

## 2019-01-05 NOTE — Progress Notes (Signed)
Received report from night shift nurse. Pt is currently in dialysis.

## 2019-01-05 NOTE — Progress Notes (Signed)
Pt returned from dialysis, no acute distress, denies pain at this time. Continue to monitor,.

## 2019-01-07 DIAGNOSIS — N2581 Secondary hyperparathyroidism of renal origin: Secondary | ICD-10-CM | POA: Diagnosis not present

## 2019-01-07 DIAGNOSIS — N186 End stage renal disease: Secondary | ICD-10-CM | POA: Diagnosis not present

## 2019-01-10 DIAGNOSIS — N186 End stage renal disease: Secondary | ICD-10-CM | POA: Diagnosis not present

## 2019-01-10 DIAGNOSIS — N2581 Secondary hyperparathyroidism of renal origin: Secondary | ICD-10-CM | POA: Diagnosis not present

## 2019-01-11 ENCOUNTER — Encounter (HOSPITAL_COMMUNITY): Payer: Federal, State, Local not specified - PPO

## 2019-01-12 DIAGNOSIS — N2581 Secondary hyperparathyroidism of renal origin: Secondary | ICD-10-CM | POA: Diagnosis not present

## 2019-01-12 DIAGNOSIS — N186 End stage renal disease: Secondary | ICD-10-CM | POA: Diagnosis not present

## 2019-01-14 DIAGNOSIS — N186 End stage renal disease: Secondary | ICD-10-CM | POA: Diagnosis not present

## 2019-01-14 DIAGNOSIS — N2581 Secondary hyperparathyroidism of renal origin: Secondary | ICD-10-CM | POA: Diagnosis not present

## 2019-01-17 DIAGNOSIS — N186 End stage renal disease: Secondary | ICD-10-CM | POA: Diagnosis not present

## 2019-01-17 DIAGNOSIS — N2581 Secondary hyperparathyroidism of renal origin: Secondary | ICD-10-CM | POA: Diagnosis not present

## 2019-01-19 DIAGNOSIS — N186 End stage renal disease: Secondary | ICD-10-CM | POA: Diagnosis not present

## 2019-01-19 DIAGNOSIS — N2581 Secondary hyperparathyroidism of renal origin: Secondary | ICD-10-CM | POA: Diagnosis not present

## 2019-01-21 DIAGNOSIS — N2581 Secondary hyperparathyroidism of renal origin: Secondary | ICD-10-CM | POA: Diagnosis not present

## 2019-01-21 DIAGNOSIS — N186 End stage renal disease: Secondary | ICD-10-CM | POA: Diagnosis not present

## 2019-01-24 DIAGNOSIS — N2581 Secondary hyperparathyroidism of renal origin: Secondary | ICD-10-CM | POA: Diagnosis not present

## 2019-01-24 DIAGNOSIS — N186 End stage renal disease: Secondary | ICD-10-CM | POA: Diagnosis not present

## 2019-01-25 ENCOUNTER — Encounter (HOSPITAL_COMMUNITY): Payer: Federal, State, Local not specified - PPO

## 2019-01-26 DIAGNOSIS — N2581 Secondary hyperparathyroidism of renal origin: Secondary | ICD-10-CM | POA: Diagnosis not present

## 2019-01-26 DIAGNOSIS — N186 End stage renal disease: Secondary | ICD-10-CM | POA: Diagnosis not present

## 2019-01-28 DIAGNOSIS — N186 End stage renal disease: Secondary | ICD-10-CM | POA: Diagnosis not present

## 2019-01-28 DIAGNOSIS — N2581 Secondary hyperparathyroidism of renal origin: Secondary | ICD-10-CM | POA: Diagnosis not present

## 2019-01-31 DIAGNOSIS — N186 End stage renal disease: Secondary | ICD-10-CM | POA: Diagnosis not present

## 2019-01-31 DIAGNOSIS — N2581 Secondary hyperparathyroidism of renal origin: Secondary | ICD-10-CM | POA: Diagnosis not present

## 2019-02-02 DIAGNOSIS — N186 End stage renal disease: Secondary | ICD-10-CM | POA: Diagnosis not present

## 2019-02-02 DIAGNOSIS — N2581 Secondary hyperparathyroidism of renal origin: Secondary | ICD-10-CM | POA: Diagnosis not present

## 2019-02-04 DIAGNOSIS — N2581 Secondary hyperparathyroidism of renal origin: Secondary | ICD-10-CM | POA: Diagnosis not present

## 2019-02-04 DIAGNOSIS — N186 End stage renal disease: Secondary | ICD-10-CM | POA: Diagnosis not present

## 2019-02-05 DIAGNOSIS — N186 End stage renal disease: Secondary | ICD-10-CM | POA: Diagnosis not present

## 2019-02-05 DIAGNOSIS — Z992 Dependence on renal dialysis: Secondary | ICD-10-CM | POA: Diagnosis not present

## 2019-02-05 DIAGNOSIS — E1122 Type 2 diabetes mellitus with diabetic chronic kidney disease: Secondary | ICD-10-CM | POA: Diagnosis not present

## 2019-02-07 DIAGNOSIS — N186 End stage renal disease: Secondary | ICD-10-CM | POA: Diagnosis not present

## 2019-02-07 DIAGNOSIS — N2581 Secondary hyperparathyroidism of renal origin: Secondary | ICD-10-CM | POA: Diagnosis not present

## 2019-02-09 DIAGNOSIS — N186 End stage renal disease: Secondary | ICD-10-CM | POA: Diagnosis not present

## 2019-02-09 DIAGNOSIS — N2581 Secondary hyperparathyroidism of renal origin: Secondary | ICD-10-CM | POA: Diagnosis not present

## 2019-02-11 DIAGNOSIS — N186 End stage renal disease: Secondary | ICD-10-CM | POA: Diagnosis not present

## 2019-02-11 DIAGNOSIS — N2581 Secondary hyperparathyroidism of renal origin: Secondary | ICD-10-CM | POA: Diagnosis not present

## 2019-02-14 ENCOUNTER — Other Ambulatory Visit: Payer: Self-pay

## 2019-02-14 DIAGNOSIS — N2581 Secondary hyperparathyroidism of renal origin: Secondary | ICD-10-CM | POA: Diagnosis not present

## 2019-02-14 DIAGNOSIS — N185 Chronic kidney disease, stage 5: Secondary | ICD-10-CM

## 2019-02-14 DIAGNOSIS — N186 End stage renal disease: Secondary | ICD-10-CM | POA: Diagnosis not present

## 2019-02-15 DIAGNOSIS — T85398D Other mechanical complication of other ocular prosthetic devices, implants and grafts, subsequent encounter: Secondary | ICD-10-CM | POA: Diagnosis not present

## 2019-02-15 DIAGNOSIS — E113593 Type 2 diabetes mellitus with proliferative diabetic retinopathy without macular edema, bilateral: Secondary | ICD-10-CM | POA: Diagnosis not present

## 2019-02-15 DIAGNOSIS — H35372 Puckering of macula, left eye: Secondary | ICD-10-CM | POA: Diagnosis not present

## 2019-02-15 DIAGNOSIS — H2513 Age-related nuclear cataract, bilateral: Secondary | ICD-10-CM | POA: Diagnosis not present

## 2019-02-17 ENCOUNTER — Ambulatory Visit (HOSPITAL_COMMUNITY)
Admission: RE | Admit: 2019-02-17 | Discharge: 2019-02-17 | Disposition: A | Discharge status: Home / Self Care | Payer: Federal, State, Local not specified - PPO | Source: Ambulatory Visit | Attending: Family | Admitting: Family

## 2019-02-17 ENCOUNTER — Ambulatory Visit (INDEPENDENT_AMBULATORY_CARE_PROVIDER_SITE_OTHER): Payer: Self-pay | Admitting: Family

## 2019-02-17 ENCOUNTER — Other Ambulatory Visit: Payer: Self-pay

## 2019-02-17 ENCOUNTER — Encounter: Payer: Self-pay | Admitting: Family

## 2019-02-17 VITALS — BP 142/73 | HR 96 | Resp 16 | Ht 64.0 in | Wt 153.0 lb

## 2019-02-17 DIAGNOSIS — N186 End stage renal disease: Secondary | ICD-10-CM

## 2019-02-17 DIAGNOSIS — Z992 Dependence on renal dialysis: Secondary | ICD-10-CM

## 2019-02-17 DIAGNOSIS — N2581 Secondary hyperparathyroidism of renal origin: Secondary | ICD-10-CM | POA: Diagnosis not present

## 2019-02-17 DIAGNOSIS — N185 Chronic kidney disease, stage 5: Secondary | ICD-10-CM | POA: Diagnosis not present

## 2019-02-17 NOTE — Progress Notes (Signed)
CC: post op AVF check, Established Dialysis Access  History of Present Illness  Colleen [name] is a 58 y.o. ([birth year]) female who is s/p Ultrasound-guided insertion of Palidrome catheter, first stage basilic vein fistula creation, left arm on 12-31-18 by Dr. Oneida Alar.   She returns today for post op follow up.   She dialyzes M-W-F via the right IJ TDC at [place] in [facility].   This is her first permanent access. She is right hand dominant.   Pt denies hx of stroke, denies hx of Bell's Palsy, has facial asymmetry. 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    Past Medical History:  Diagnosis Date  . Chronic kidney disease   . Diabetes ([place])   . DVT (deep venous thrombosis) ([place])   . E-coli UTI   . Gout 06/05/2018  . HLD (hyperlipidemia) 06/05/2018  . PE (pulmonary embolism)   . Pulled muscle    pt has right sided facial droop from pulled muscle in face since birth    Social History Social History   Tobacco Use  . Smoking status: Never Smoker  . Smokeless tobacco: Never Used  Substance Use Topics  . Alcohol use: No  . Drug use: No    Family History Family History  Problem Relation Age of Onset  . CAD Neg Hx     Surgical History Past Surgical History:  Procedure Laterality Date  . AV FISTULA PLACEMENT Left 12/31/2018   Procedure: ARTERIOVENOUS (AV) FISTULA CREATION VERSUS INSERTION OF ARTERIOVENOUS GRAFT LEFT ARM;  Surgeon: Elam Dutch, MD;  Location: [facility];  Service: Vascular;  Laterality: Left;  . INSERTION OF DIALYSIS CATHETER N/A 12/31/2018   Procedure: INSERTION OF TUNNELED  DIALYSIS CATHETER;  Surgeon: Elam Dutch, MD;  Location: [facility] OR;  Service: Vascular;  Laterality: N/A;  . NO PAST SURGERIES      No Known Allergies  Current Outpatient Medications  Medication Sig Dispense Refill  . Amino Acids-Protein Hydrolys (FEEDING SUPPLEMENT, PRO-STAT SUGAR FREE 64,) LIQD Take 30 mLs by mouth 2 (two) times daily.  887 mL 0  . amLODipine (NORVASC) 10 MG tablet Take 1 tablet (10 mg total) by mouth daily for 21 days. 21 tablet 0  . calcitRIOL (ROCALTROL) 0.5 MCG capsule Take 1 capsule (0.5 mcg total) by mouth daily. 30 capsule 0  . calcium acetate (PHOSLO) 667 MG capsule Take 2 capsules (1,334 mg total) by mouth 3 (three) times daily with meals. 90 capsule 0  . carvedilol (COREG) 12.5 MG tablet Take 1 tablet (12.5 mg total) by mouth 2 (two) times daily with a meal for 21 days. 42 tablet 0  . insulin detemir (LEVEMIR) 100 UNIT/ML injection Inject 0.04 mLs (4 Units total) into the skin at bedtime. 10 mL 11  . multivitamin (RENA-VIT) TABS tablet Take 1 tablet by mouth at bedtime. 30 tablet 0   No current facility-administered medications for this visit.      REVIEW OF SYSTEMS: see HPI for pertinent positives and negatives    PHYSICAL EXAMINATION:  Vitals:   02/17/19 1419  BP: (!) 142/73  Pulse: 96  Resp: 16  SpO2: 92%  Weight: 153 lb (69.4 kg)  Height: 5\' 4"  (1.626 m)   Body mass index is 26.26 kg/m.  General: The patient appears younger than stated age.   HEENT:  Facial asymmetry.  Pulmonary: Respirations are non-labored, CTAB. Abdomen: Soft and non-tender with normal bowel sounds. Musculoskeletal: There are no major deformities.  Neurologic: No focal weakness or paresthesias are detected Skin: There are no ulcer or rashes noted. Psychiatric: The patient has normal affect. Cardiovascular: There is a regular rate and rhythm without significant murmur appreciated.  Right IJ TDC. Left upper arm AVF with palpable thrill, audible bruit.    Non-Invasive Vascular Imaging  Left arm Access Duplex  (Date: 02/17/2019):  Findings: +--------------------+----------+-----------------+--------+ AVF                 PSV (cm/s)Flow Vol (mL/min)Comments +--------------------+----------+-----------------+--------+ Native artery inflow   265          1327                 +--------------------+----------+-----------------+--------+ AVF Anastomosis        666                              +--------------------+----------+-----------------+--------+    +------------+----------+-------------+----------+----------------+ OUTFLOW VEINPSV (cm/s)Diameter (cm)Depth (cm)    Describe     +------------+----------+-------------+----------+----------------+ Prox UA        175        0.41        0.75   competing branch +------------+----------+-------------+----------+----------------+ Mid AVF        224        0.59        0.91                    +------------+----------+-------------+----------+----------------+ Dist AVF       487        0.41        0.55                    +------------+----------+-------------+----------+----------------+ AC Fossa       435        0.35        0.48                    +------------+----------+-------------+----------+----------------+   Summary: Patent arteriovenous fistula with no visualized stenosis.   Medical Decision Making  Colleen [name] is a 58 y.o. female who is s/p Ultrasound-guided insertion of Palidrome catheter, first stage basilic vein fistula creation, left arm on 12-31-18 by Dr. Oneida Alar.  She has no steal sx's in left UE.  AVF diameters are somewhat small. Pt advised to squeeze a rubber ball daily to facilitate increasing the diameters of the left UE AVF.    Based on vein mapping and examination, and after discussing with Dr. Oneida Alar, pt will return about April 01 2019 with left upper arm AVF duplex, see Dr. Oneida Alar afterward.   Clemon Chambers, RN, MSN, FNP-C [facility] Office: [phone]  02/17/2019, 2:37 PM  Clinic KP:[id number]

## 2019-02-18 DIAGNOSIS — N2581 Secondary hyperparathyroidism of renal origin: Secondary | ICD-10-CM | POA: Diagnosis not present

## 2019-02-18 DIAGNOSIS — N186 End stage renal disease: Secondary | ICD-10-CM | POA: Diagnosis not present

## 2019-02-21 DIAGNOSIS — N186 End stage renal disease: Secondary | ICD-10-CM | POA: Diagnosis not present

## 2019-02-21 DIAGNOSIS — N2581 Secondary hyperparathyroidism of renal origin: Secondary | ICD-10-CM | POA: Diagnosis not present

## 2019-02-23 DIAGNOSIS — N2581 Secondary hyperparathyroidism of renal origin: Secondary | ICD-10-CM | POA: Diagnosis not present

## 2019-02-23 DIAGNOSIS — N186 End stage renal disease: Secondary | ICD-10-CM | POA: Diagnosis not present

## 2019-02-25 DIAGNOSIS — N2581 Secondary hyperparathyroidism of renal origin: Secondary | ICD-10-CM | POA: Diagnosis not present

## 2019-02-25 DIAGNOSIS — N186 End stage renal disease: Secondary | ICD-10-CM | POA: Diagnosis not present

## 2019-02-28 DIAGNOSIS — N2581 Secondary hyperparathyroidism of renal origin: Secondary | ICD-10-CM | POA: Diagnosis not present

## 2019-02-28 DIAGNOSIS — N186 End stage renal disease: Secondary | ICD-10-CM | POA: Diagnosis not present

## 2019-03-02 DIAGNOSIS — N186 End stage renal disease: Secondary | ICD-10-CM | POA: Diagnosis not present

## 2019-03-02 DIAGNOSIS — N2581 Secondary hyperparathyroidism of renal origin: Secondary | ICD-10-CM | POA: Diagnosis not present

## 2019-03-04 DIAGNOSIS — N2581 Secondary hyperparathyroidism of renal origin: Secondary | ICD-10-CM | POA: Diagnosis not present

## 2019-03-04 DIAGNOSIS — N186 End stage renal disease: Secondary | ICD-10-CM | POA: Diagnosis not present

## 2019-03-07 DIAGNOSIS — N2581 Secondary hyperparathyroidism of renal origin: Secondary | ICD-10-CM | POA: Diagnosis not present

## 2019-03-07 DIAGNOSIS — N186 End stage renal disease: Secondary | ICD-10-CM | POA: Diagnosis not present

## 2019-03-08 DIAGNOSIS — E1122 Type 2 diabetes mellitus with diabetic chronic kidney disease: Secondary | ICD-10-CM | POA: Diagnosis not present

## 2019-03-08 DIAGNOSIS — Z992 Dependence on renal dialysis: Secondary | ICD-10-CM | POA: Diagnosis not present

## 2019-03-08 DIAGNOSIS — N186 End stage renal disease: Secondary | ICD-10-CM | POA: Diagnosis not present

## 2019-03-09 DIAGNOSIS — N186 End stage renal disease: Secondary | ICD-10-CM | POA: Diagnosis not present

## 2019-03-09 DIAGNOSIS — N2581 Secondary hyperparathyroidism of renal origin: Secondary | ICD-10-CM | POA: Diagnosis not present

## 2019-03-11 DIAGNOSIS — N186 End stage renal disease: Secondary | ICD-10-CM | POA: Diagnosis not present

## 2019-03-11 DIAGNOSIS — N2581 Secondary hyperparathyroidism of renal origin: Secondary | ICD-10-CM | POA: Diagnosis not present

## 2019-03-14 DIAGNOSIS — N186 End stage renal disease: Secondary | ICD-10-CM | POA: Diagnosis not present

## 2019-03-14 DIAGNOSIS — N2581 Secondary hyperparathyroidism of renal origin: Secondary | ICD-10-CM | POA: Diagnosis not present

## 2019-03-16 ENCOUNTER — Other Ambulatory Visit: Payer: Self-pay

## 2019-03-16 DIAGNOSIS — N186 End stage renal disease: Secondary | ICD-10-CM

## 2019-03-16 DIAGNOSIS — Z992 Dependence on renal dialysis: Secondary | ICD-10-CM

## 2019-03-16 DIAGNOSIS — N2581 Secondary hyperparathyroidism of renal origin: Secondary | ICD-10-CM | POA: Diagnosis not present

## 2019-03-18 DIAGNOSIS — N186 End stage renal disease: Secondary | ICD-10-CM | POA: Diagnosis not present

## 2019-03-18 DIAGNOSIS — N2581 Secondary hyperparathyroidism of renal origin: Secondary | ICD-10-CM | POA: Diagnosis not present

## 2019-03-21 DIAGNOSIS — N186 End stage renal disease: Secondary | ICD-10-CM | POA: Diagnosis not present

## 2019-03-21 DIAGNOSIS — N2581 Secondary hyperparathyroidism of renal origin: Secondary | ICD-10-CM | POA: Diagnosis not present

## 2019-03-23 DIAGNOSIS — N186 End stage renal disease: Secondary | ICD-10-CM | POA: Diagnosis not present

## 2019-03-23 DIAGNOSIS — N2581 Secondary hyperparathyroidism of renal origin: Secondary | ICD-10-CM | POA: Diagnosis not present

## 2019-03-25 DIAGNOSIS — N2581 Secondary hyperparathyroidism of renal origin: Secondary | ICD-10-CM | POA: Diagnosis not present

## 2019-03-25 DIAGNOSIS — N186 End stage renal disease: Secondary | ICD-10-CM | POA: Diagnosis not present

## 2019-03-28 DIAGNOSIS — N2581 Secondary hyperparathyroidism of renal origin: Secondary | ICD-10-CM | POA: Diagnosis not present

## 2019-03-28 DIAGNOSIS — N186 End stage renal disease: Secondary | ICD-10-CM | POA: Diagnosis not present

## 2019-03-29 DIAGNOSIS — H2513 Age-related nuclear cataract, bilateral: Secondary | ICD-10-CM | POA: Diagnosis not present

## 2019-03-29 DIAGNOSIS — H35372 Puckering of macula, left eye: Secondary | ICD-10-CM | POA: Diagnosis not present

## 2019-03-29 DIAGNOSIS — E113553 Type 2 diabetes mellitus with stable proliferative diabetic retinopathy, bilateral: Secondary | ICD-10-CM | POA: Diagnosis not present

## 2019-03-29 DIAGNOSIS — H4312 Vitreous hemorrhage, left eye: Secondary | ICD-10-CM | POA: Diagnosis not present

## 2019-03-30 DIAGNOSIS — N2581 Secondary hyperparathyroidism of renal origin: Secondary | ICD-10-CM | POA: Diagnosis not present

## 2019-03-30 DIAGNOSIS — N186 End stage renal disease: Secondary | ICD-10-CM | POA: Diagnosis not present

## 2019-03-31 ENCOUNTER — Ambulatory Visit: Payer: Federal, State, Local not specified - PPO | Admitting: Family

## 2019-03-31 ENCOUNTER — Encounter (HOSPITAL_COMMUNITY): Payer: Federal, State, Local not specified - PPO

## 2019-03-31 ENCOUNTER — Ambulatory Visit: Payer: Federal, State, Local not specified - PPO | Admitting: Vascular Surgery

## 2019-04-01 ENCOUNTER — Ambulatory Visit: Payer: Federal, State, Local not specified - PPO | Admitting: Family

## 2019-04-01 ENCOUNTER — Encounter (HOSPITAL_COMMUNITY): Payer: Federal, State, Local not specified - PPO

## 2019-04-01 DIAGNOSIS — N2581 Secondary hyperparathyroidism of renal origin: Secondary | ICD-10-CM | POA: Diagnosis not present

## 2019-04-01 DIAGNOSIS — N186 End stage renal disease: Secondary | ICD-10-CM | POA: Diagnosis not present

## 2019-04-04 DIAGNOSIS — N186 End stage renal disease: Secondary | ICD-10-CM | POA: Diagnosis not present

## 2019-04-04 DIAGNOSIS — N2581 Secondary hyperparathyroidism of renal origin: Secondary | ICD-10-CM | POA: Diagnosis not present

## 2019-04-06 DIAGNOSIS — N2581 Secondary hyperparathyroidism of renal origin: Secondary | ICD-10-CM | POA: Diagnosis not present

## 2019-04-06 DIAGNOSIS — N186 End stage renal disease: Secondary | ICD-10-CM | POA: Diagnosis not present

## 2019-04-07 DIAGNOSIS — N186 End stage renal disease: Secondary | ICD-10-CM | POA: Diagnosis not present

## 2019-04-07 DIAGNOSIS — Z992 Dependence on renal dialysis: Secondary | ICD-10-CM | POA: Diagnosis not present

## 2019-04-07 DIAGNOSIS — E1122 Type 2 diabetes mellitus with diabetic chronic kidney disease: Secondary | ICD-10-CM | POA: Diagnosis not present

## 2019-04-08 DIAGNOSIS — N186 End stage renal disease: Secondary | ICD-10-CM | POA: Diagnosis not present

## 2019-04-08 DIAGNOSIS — N2581 Secondary hyperparathyroidism of renal origin: Secondary | ICD-10-CM | POA: Diagnosis not present

## 2019-04-11 DIAGNOSIS — N186 End stage renal disease: Secondary | ICD-10-CM | POA: Diagnosis not present

## 2019-04-11 DIAGNOSIS — N2581 Secondary hyperparathyroidism of renal origin: Secondary | ICD-10-CM | POA: Diagnosis not present

## 2019-04-13 DIAGNOSIS — N186 End stage renal disease: Secondary | ICD-10-CM | POA: Diagnosis not present

## 2019-04-13 DIAGNOSIS — N2581 Secondary hyperparathyroidism of renal origin: Secondary | ICD-10-CM | POA: Diagnosis not present

## 2019-04-15 DIAGNOSIS — N186 End stage renal disease: Secondary | ICD-10-CM | POA: Diagnosis not present

## 2019-04-15 DIAGNOSIS — N2581 Secondary hyperparathyroidism of renal origin: Secondary | ICD-10-CM | POA: Diagnosis not present

## 2019-04-18 DIAGNOSIS — N186 End stage renal disease: Secondary | ICD-10-CM | POA: Diagnosis not present

## 2019-04-18 DIAGNOSIS — N2581 Secondary hyperparathyroidism of renal origin: Secondary | ICD-10-CM | POA: Diagnosis not present

## 2019-04-20 DIAGNOSIS — N2581 Secondary hyperparathyroidism of renal origin: Secondary | ICD-10-CM | POA: Diagnosis not present

## 2019-04-20 DIAGNOSIS — N186 End stage renal disease: Secondary | ICD-10-CM | POA: Diagnosis not present

## 2019-04-22 DIAGNOSIS — N2581 Secondary hyperparathyroidism of renal origin: Secondary | ICD-10-CM | POA: Diagnosis not present

## 2019-04-22 DIAGNOSIS — N186 End stage renal disease: Secondary | ICD-10-CM | POA: Diagnosis not present

## 2019-04-25 DIAGNOSIS — N2581 Secondary hyperparathyroidism of renal origin: Secondary | ICD-10-CM | POA: Diagnosis not present

## 2019-04-25 DIAGNOSIS — N186 End stage renal disease: Secondary | ICD-10-CM | POA: Diagnosis not present

## 2019-04-27 DIAGNOSIS — N186 End stage renal disease: Secondary | ICD-10-CM | POA: Diagnosis not present

## 2019-04-27 DIAGNOSIS — N2581 Secondary hyperparathyroidism of renal origin: Secondary | ICD-10-CM | POA: Diagnosis not present

## 2019-04-29 DIAGNOSIS — N2581 Secondary hyperparathyroidism of renal origin: Secondary | ICD-10-CM | POA: Diagnosis not present

## 2019-04-29 DIAGNOSIS — N186 End stage renal disease: Secondary | ICD-10-CM | POA: Diagnosis not present

## 2019-05-02 DIAGNOSIS — N2581 Secondary hyperparathyroidism of renal origin: Secondary | ICD-10-CM | POA: Diagnosis not present

## 2019-05-02 DIAGNOSIS — N186 End stage renal disease: Secondary | ICD-10-CM | POA: Diagnosis not present

## 2019-05-04 DIAGNOSIS — N2581 Secondary hyperparathyroidism of renal origin: Secondary | ICD-10-CM | POA: Diagnosis not present

## 2019-05-04 DIAGNOSIS — N186 End stage renal disease: Secondary | ICD-10-CM | POA: Diagnosis not present

## 2019-05-06 DIAGNOSIS — N2581 Secondary hyperparathyroidism of renal origin: Secondary | ICD-10-CM | POA: Diagnosis not present

## 2019-05-06 DIAGNOSIS — N186 End stage renal disease: Secondary | ICD-10-CM | POA: Diagnosis not present

## 2019-05-08 DIAGNOSIS — E1122 Type 2 diabetes mellitus with diabetic chronic kidney disease: Secondary | ICD-10-CM | POA: Diagnosis not present

## 2019-05-08 DIAGNOSIS — Z992 Dependence on renal dialysis: Secondary | ICD-10-CM | POA: Diagnosis not present

## 2019-05-08 DIAGNOSIS — N186 End stage renal disease: Secondary | ICD-10-CM | POA: Diagnosis not present

## 2019-05-09 DIAGNOSIS — N2581 Secondary hyperparathyroidism of renal origin: Secondary | ICD-10-CM | POA: Diagnosis not present

## 2019-05-09 DIAGNOSIS — L299 Pruritus, unspecified: Secondary | ICD-10-CM | POA: Diagnosis not present

## 2019-05-09 DIAGNOSIS — N186 End stage renal disease: Secondary | ICD-10-CM | POA: Diagnosis not present

## 2019-05-10 DIAGNOSIS — Z86718 Personal history of other venous thrombosis and embolism: Secondary | ICD-10-CM | POA: Diagnosis not present

## 2019-05-10 DIAGNOSIS — I10 Essential (primary) hypertension: Secondary | ICD-10-CM | POA: Diagnosis not present

## 2019-05-10 DIAGNOSIS — I82402 Acute embolism and thrombosis of unspecified deep veins of left lower extremity: Secondary | ICD-10-CM | POA: Diagnosis not present

## 2019-05-10 DIAGNOSIS — N186 End stage renal disease: Secondary | ICD-10-CM | POA: Diagnosis not present

## 2019-05-10 DIAGNOSIS — E11649 Type 2 diabetes mellitus with hypoglycemia without coma: Secondary | ICD-10-CM | POA: Diagnosis not present

## 2019-05-10 DIAGNOSIS — Z01818 Encounter for other preprocedural examination: Secondary | ICD-10-CM | POA: Diagnosis not present

## 2019-05-10 DIAGNOSIS — Z79899 Other long term (current) drug therapy: Secondary | ICD-10-CM | POA: Diagnosis not present

## 2019-05-10 DIAGNOSIS — K029 Dental caries, unspecified: Secondary | ICD-10-CM | POA: Diagnosis not present

## 2019-05-10 DIAGNOSIS — R931 Abnormal findings on diagnostic imaging of heart and coronary circulation: Secondary | ICD-10-CM | POA: Diagnosis not present

## 2019-05-10 DIAGNOSIS — Z992 Dependence on renal dialysis: Secondary | ICD-10-CM | POA: Diagnosis not present

## 2019-05-10 DIAGNOSIS — I272 Pulmonary hypertension, unspecified: Secondary | ICD-10-CM | POA: Diagnosis not present

## 2019-05-10 DIAGNOSIS — E1122 Type 2 diabetes mellitus with diabetic chronic kidney disease: Secondary | ICD-10-CM | POA: Diagnosis not present

## 2019-05-10 DIAGNOSIS — E11319 Type 2 diabetes mellitus with unspecified diabetic retinopathy without macular edema: Secondary | ICD-10-CM | POA: Diagnosis not present

## 2019-05-10 DIAGNOSIS — Z794 Long term (current) use of insulin: Secondary | ICD-10-CM | POA: Diagnosis not present

## 2019-05-10 DIAGNOSIS — Z7682 Awaiting organ transplant status: Secondary | ICD-10-CM | POA: Diagnosis not present

## 2019-05-11 DIAGNOSIS — L299 Pruritus, unspecified: Secondary | ICD-10-CM | POA: Diagnosis not present

## 2019-05-11 DIAGNOSIS — N186 End stage renal disease: Secondary | ICD-10-CM | POA: Diagnosis not present

## 2019-05-11 DIAGNOSIS — N2581 Secondary hyperparathyroidism of renal origin: Secondary | ICD-10-CM | POA: Diagnosis not present

## 2019-05-13 DIAGNOSIS — N2581 Secondary hyperparathyroidism of renal origin: Secondary | ICD-10-CM | POA: Diagnosis not present

## 2019-05-13 DIAGNOSIS — L299 Pruritus, unspecified: Secondary | ICD-10-CM | POA: Diagnosis not present

## 2019-05-13 DIAGNOSIS — N186 End stage renal disease: Secondary | ICD-10-CM | POA: Diagnosis not present

## 2019-05-16 DIAGNOSIS — N186 End stage renal disease: Secondary | ICD-10-CM | POA: Diagnosis not present

## 2019-05-16 DIAGNOSIS — N2581 Secondary hyperparathyroidism of renal origin: Secondary | ICD-10-CM | POA: Diagnosis not present

## 2019-05-16 DIAGNOSIS — L299 Pruritus, unspecified: Secondary | ICD-10-CM | POA: Diagnosis not present

## 2019-05-18 DIAGNOSIS — N186 End stage renal disease: Secondary | ICD-10-CM | POA: Diagnosis not present

## 2019-05-18 DIAGNOSIS — L299 Pruritus, unspecified: Secondary | ICD-10-CM | POA: Diagnosis not present

## 2019-05-18 DIAGNOSIS — N2581 Secondary hyperparathyroidism of renal origin: Secondary | ICD-10-CM | POA: Diagnosis not present

## 2019-05-20 DIAGNOSIS — N2581 Secondary hyperparathyroidism of renal origin: Secondary | ICD-10-CM | POA: Diagnosis not present

## 2019-05-20 DIAGNOSIS — N186 End stage renal disease: Secondary | ICD-10-CM | POA: Diagnosis not present

## 2019-05-20 DIAGNOSIS — L299 Pruritus, unspecified: Secondary | ICD-10-CM | POA: Diagnosis not present

## 2019-05-23 DIAGNOSIS — N2581 Secondary hyperparathyroidism of renal origin: Secondary | ICD-10-CM | POA: Diagnosis not present

## 2019-05-23 DIAGNOSIS — N186 End stage renal disease: Secondary | ICD-10-CM | POA: Diagnosis not present

## 2019-05-23 DIAGNOSIS — L299 Pruritus, unspecified: Secondary | ICD-10-CM | POA: Diagnosis not present

## 2019-05-25 DIAGNOSIS — H35372 Puckering of macula, left eye: Secondary | ICD-10-CM | POA: Diagnosis not present

## 2019-05-25 DIAGNOSIS — T85398D Other mechanical complication of other ocular prosthetic devices, implants and grafts, subsequent encounter: Secondary | ICD-10-CM | POA: Diagnosis not present

## 2019-05-25 DIAGNOSIS — H2513 Age-related nuclear cataract, bilateral: Secondary | ICD-10-CM | POA: Diagnosis not present

## 2019-05-25 DIAGNOSIS — N186 End stage renal disease: Secondary | ICD-10-CM | POA: Diagnosis not present

## 2019-05-25 DIAGNOSIS — E113593 Type 2 diabetes mellitus with proliferative diabetic retinopathy without macular edema, bilateral: Secondary | ICD-10-CM | POA: Diagnosis not present

## 2019-05-25 DIAGNOSIS — N2581 Secondary hyperparathyroidism of renal origin: Secondary | ICD-10-CM | POA: Diagnosis not present

## 2019-05-25 DIAGNOSIS — L299 Pruritus, unspecified: Secondary | ICD-10-CM | POA: Diagnosis not present

## 2019-05-27 DIAGNOSIS — N186 End stage renal disease: Secondary | ICD-10-CM | POA: Diagnosis not present

## 2019-05-27 DIAGNOSIS — N2581 Secondary hyperparathyroidism of renal origin: Secondary | ICD-10-CM | POA: Diagnosis not present

## 2019-05-27 DIAGNOSIS — L299 Pruritus, unspecified: Secondary | ICD-10-CM | POA: Diagnosis not present

## 2019-05-30 ENCOUNTER — Other Ambulatory Visit: Payer: Self-pay

## 2019-05-30 DIAGNOSIS — Z992 Dependence on renal dialysis: Secondary | ICD-10-CM

## 2019-05-30 DIAGNOSIS — N2581 Secondary hyperparathyroidism of renal origin: Secondary | ICD-10-CM | POA: Diagnosis not present

## 2019-05-30 DIAGNOSIS — N186 End stage renal disease: Secondary | ICD-10-CM | POA: Diagnosis not present

## 2019-06-01 ENCOUNTER — Telehealth: Payer: Self-pay | Admitting: *Deleted

## 2019-06-01 DIAGNOSIS — N2581 Secondary hyperparathyroidism of renal origin: Secondary | ICD-10-CM | POA: Diagnosis not present

## 2019-06-01 DIAGNOSIS — N186 End stage renal disease: Secondary | ICD-10-CM | POA: Diagnosis not present

## 2019-06-01 NOTE — Telephone Encounter (Signed)
Virtual Visit Pre-Appointment Phone Call  Today, I spoke with Colleen [name] and performed the following actions:  1. I explained that we are currently trying to limit exposure to the COVID-19 virus by seeing patients at home rather than in the office.  I explained that the visits are best done by video, but can be done by telephone.  I asked the patient if a virtual visit that the patient would like to try instead of coming into the office. Colleen [name] agreed to proceed with the virtual visit scheduled with Vinnie Level Nickel NP on 06/02/19.      2. I confirmed the BEST phone number to call the day of the visit and- I included this in appointment notes.  3. I asked if the patient had access to (through a family member/friend) a smartphone with video capability to be used for her visit?"  The patient said yes -      4. I confirmed consent by  a. sending through [place] or by email the [place] as written at the end of this message or  b. verbally as listed below. i. This visit is being performed in the setting of COVID-19. ii. All virtual visits are billed to your insurance company just like a normal visit would be.   iii. We'd like you to understand that the technology does not allow for your provider to perform an examination, and thus may limit your provider's ability to fully assess your condition.  iv. If your provider identifies any concerns that need to be evaluated in person, we will make arrangements to do so.   v. Finally, though the technology is pretty good, we cannot assure that it will always work on either your or our end, and in the setting of a video visit, we may have to convert it to a phone-only visit.  In either situation, we cannot ensure that we have a secure connection.   vi. Are you willing to proceed?"  STAFF: Did the patient verbally acknowledge consent to telehealth visit? Document YES/NO here: YES  2. I advised the  patient to be prepared - I asked that the patient, on the day of her visit, record any information possible with the equipment at her home, such as blood pressure, pulse, oxygen saturation, and your weight and write them all down. I asked the patient to have a pen and paper handy nearby the day of the visit as well.  3. If the patient was scheduled for a video visit, I informed the patient that the visit with the doctor would start with a text to the smartphone # given to [name] by the patient.         If the patient was scheduled for a telephone call, I informed the patient that the visit with the doctor would start with a call to the telephone # given to [name] by the patient.  4. I Informed patient they will receive a phone call 15 minutes prior to their appointment time from a [place] or nurse to review medications, allergies, etc. to prepare for the visit.    TELEPHONE CALL NOTE  Colleen [name] has been deemed a candidate for a follow-up tele-health visit to limit community exposure during the Covid-19 pandemic. I spoke with the patient via phone to ensure availability of phone/video source, confirm preferred email & phone number, and discuss instructions and expectations.  I reminded Colleen [name] to be prepared with  any vital sign and/or heart rhythm information that could potentially be obtained via home monitoring, at the time of her visit. I reminded Colleen [name] to expect a phone call prior to her visit.  Colleen [name], NT 06/01/2019 4:06 PM     FULL LENGTH CONSENT FOR TELE-HEALTH VISIT   I hereby voluntarily request, consent and authorize [facility] and its employed or contracted physicians, physician assistants, nurse practitioners or other licensed health care professionals (the Practitioner), to provide me with telemedicine health care services (the "Services") as deemed necessary by the treating Practitioner. I acknowledge and consent to receive the Services by the  Practitioner via telemedicine. I understand that the telemedicine visit will involve communicating with the Practitioner through live audiovisual communication technology and the disclosure of certain medical information by electronic transmission. I acknowledge that I have been given the opportunity to request an in-person assessment or other available alternative prior to the telemedicine visit and am voluntarily participating in the telemedicine visit.  I understand that I have the right to withhold or withdraw my consent to the use of telemedicine in the course of my care at any time, without affecting my right to future care or treatment, and that the Practitioner or I may terminate the telemedicine visit at any time. I understand that I have the right to inspect all information obtained and/or recorded in the course of the telemedicine visit and may receive copies of available information for a reasonable fee.  I understand that some of the potential risks of receiving the Services via telemedicine include:  Marland Kitchen Delay or interruption in medical evaluation due to technological equipment failure or disruption; . Information transmitted may not be sufficient (e.g. poor resolution of images) to allow for appropriate medical decision making by the Practitioner; and/or  . In rare instances, security protocols could fail, causing a breach of personal health information.  Furthermore, I acknowledge that it is my responsibility to provide information about my medical history, conditions and care that is complete and accurate to the best of my ability. I acknowledge that Practitioner's advice, recommendations, and/or decision may be based on factors not within their control, such as incomplete or inaccurate data provided by me or distortions of diagnostic images or specimens that may result from electronic transmissions. I understand that the practice of medicine is not an exact science and that Practitioner makes  no warranties or guarantees regarding treatment outcomes. I acknowledge that I will receive a copy of this consent concurrently upon execution via email to the email address I last provided but may also request a printed copy by calling the office of [place].    I understand that my insurance will be billed for this visit.   I have read or had this consent read to me. . I understand the contents of this consent, which adequately explains the benefits and risks of the Services being provided via telemedicine.  . I have been provided ample opportunity to ask questions regarding this consent and the Services and have had my questions answered to my satisfaction. . I give my informed consent for the services to be provided through the use of telemedicine in my medical care  By participating in this telemedicine visit I agree to the above.

## 2019-06-02 ENCOUNTER — Ambulatory Visit (HOSPITAL_COMMUNITY)
Admission: RE | Admit: 2019-06-02 | Discharge: 2019-06-02 | Disposition: A | Discharge status: Home / Self Care | Payer: Federal, State, Local not specified - PPO | Source: Ambulatory Visit | Attending: Family | Admitting: Family

## 2019-06-02 ENCOUNTER — Encounter: Payer: Self-pay | Admitting: Family

## 2019-06-02 ENCOUNTER — Other Ambulatory Visit: Payer: Self-pay

## 2019-06-02 ENCOUNTER — Ambulatory Visit: Payer: Federal, State, Local not specified - PPO | Admitting: Family

## 2019-06-02 VITALS — BP 173/85 | HR 74 | Temp 97.9°F | Resp 12 | Wt 154.0 lb

## 2019-06-02 DIAGNOSIS — Z992 Dependence on renal dialysis: Secondary | ICD-10-CM

## 2019-06-02 DIAGNOSIS — I770 Arteriovenous fistula, acquired: Secondary | ICD-10-CM | POA: Diagnosis not present

## 2019-06-02 DIAGNOSIS — N186 End stage renal disease: Secondary | ICD-10-CM

## 2019-06-02 NOTE — Progress Notes (Signed)
Virtual Visit via Telephone Note  I connected with Colleen [name] on 06/02/2019 using the [employer] by telephone and verified that I was speaking with the correct person using two identifiers. Patient was located at her home and accompanied by herself. I am located at the [facility] office.   The limitations of evaluation and management by telemedicine and the availability of in person appointments have been previously discussed with the patient and are documented in the patients chart. The patient expressed understanding and consented to proceed.  PCP: [name], DO  Chief Complaint: Follow up access duplex  History of Present Illness: Colleen [name] is a 58 y.o. female who is s/p Ultrasound-guided insertion of Palidrome catheter, first stage basilic vein fistula creation, left arm on 12-31-18 by Dr. Oneida Alar.   I last evaluated her on 02-17-19. At that time she has no steal sx's in left UE.  AVF diameters were somewhat small. Pt advised to squeeze a rubber ball daily to facilitate increasing the diameters of the left UE AVF.   Based on vein mapping and examination, and after discussing with Dr. Oneida Alar, pt was to return about April 01 2019 with left upper arm AVF duplex, see Dr. Oneida Alar afterward.  She dialyzes M-W-F via the right IJ TDC at [place] in [facility].   This is her first permanent access. She is right hand dominant.   Pt denies hx of stroke, denies hx of Bell's Palsy, has facial asymmetry. 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   Past Medical History:  Diagnosis Date  . Chronic kidney disease   . Diabetes ([place])   . DVT (deep venous thrombosis) ([place])   . E-coli UTI   . Gout 06/05/2018  . HLD (hyperlipidemia) 06/05/2018  . PE (pulmonary embolism)   . 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 AV FISTULA PLACEMENT Left  12/31/2018   Procedure: ARTERIOVENOUS (AV) FISTULA CREATION VERSUS INSERTION OF ARTERIOVENOUS GRAFT LEFT ARM;  Surgeon: Elam Dutch, MD;  Location: [place];  Service: Vascular;  Laterality: Left;  . INSERTION OF DIALYSIS CATHETER N/A 12/31/2018   Procedure: INSERTION OF TUNNELED  DIALYSIS CATHETER;  Surgeon: Elam Dutch, MD;  Location: [facility] OR;  Service: Vascular;  Laterality: N/A;  . NO PAST SURGERIES      Current Meds  Medication Sig  . Amino Acids-Protein Hydrolys (FEEDING SUPPLEMENT, PRO-STAT SUGAR FREE 64,) LIQD Take 30 mLs by mouth 2 (two) times daily.  Marland Kitchen amLODipine (NORVASC) 10 MG tablet Take 1 tablet (10 mg total) by mouth daily for 21 days.  . calcitRIOL (ROCALTROL) 0.5 MCG capsule Take 1 capsule (0.5 mcg total) by mouth daily.  . calcium acetate (PHOSLO) 667 MG capsule Take 2 capsules (1,334 mg total) by mouth 3 (three) times daily with meals.  . carvedilol (COREG) 12.5 MG tablet Take 1 tablet (12.5 mg total) by mouth 2 (two) times daily with a meal for 21 days.  . insulin detemir (LEVEMIR) 100 UNIT/ML injection Inject 0.04 mLs (4 Units total) into the skin at bedtime.  . multivitamin (RENA-VIT) TABS tablet Take 1 tablet by mouth at bedtime.    12 system ROS was negative unless otherwise noted in HPI   Observations/Objective:  Dialysis Access Duplex (06-02-19): +--------------------+----------+-----------------+--------+ AVF  PSV (cm/s)Flow Vol (mL/min)Comments +--------------------+----------+-----------------+--------+ Native artery inflow   294          2582                +--------------------+----------+-----------------+--------+ AVF Anastomosis        823                              +--------------------+----------+-----------------+--------+    +------------+----------+-------------+----------+--------+ OUTFLOW VEINPSV (cm/s)Diameter (cm)Depth (cm)Describe +------------+----------+-------------+----------+--------+ Prox  UA        132        0.87        1.16            +------------+----------+-------------+----------+--------+ Mid UA         266        0.77        1.02            +------------+----------+-------------+----------+--------+ Dist UA        242        0.77        1.06            +------------+----------+-------------+----------+--------+ AC Fossa       685        0.45        0.30            +------------+----------+-------------+----------+--------+ Summary: Patent arteriovenous fistula.    Assessment and Plan:  I discussed results of today's access duplex results and current HPI with Dr. Oneida Alar. Diameters are now large enough to proceed to 2nd stage BVT.  Plan 06-21-19 second stage left basilic vein transposition by Dr. Oneida Alar.   Follow Up Instructions:  Plan 06-21-19 second stage left basilic vein transposition by Dr. Oneida Alar.     I discussed the assessment and treatment plan with the patient. The patient was provided an opportunity to ask questions and all were answered. The patient agreed with the plan and demonstrated an understanding of the instructions.   The patient was advised to call back or seek an in-person evaluation if the symptoms worsen or if the condition fails to improve as anticipated.  I spent 10 minutes with the patient via telephone encounter.   Gabrielle Dare Jhoselin Crume [facility] [place] Office: [phone]  06/02/2019, 3:48 PM

## 2019-06-02 NOTE — H&P (View-Only) (Signed)
Virtual Visit via Telephone Note  I connected with Colleen [name] on 06/02/2019 using the Doxy[employer]me by telephone and verified that I was speaking with the correct person using two identifiers. Patient was located at her home and accompanied by herself. I am located at the [facility] office.   The limitations of evaluation and management by telemedicine and the availability of in person appointments have been previously discussed with the patient and are documented in the patients chart. The patient expressed understanding and consented to proceed.  PCP: [name], DO  Chief Complaint: Follow up access duplex  History of Present Illness: Colleen [name] is a 58 y.o. female who is s/p Ultrasound-guided insertion of Palidrome catheter, first stage basilic vein fistula creation, left arm on 12-31-18 by Dr. Oneida Alar.   I last evaluated her on 02-17-19. At that time she has no steal sx's in left UE.  AVF diameters were somewhat small. Pt advised to squeeze a rubber ball daily to facilitate increasing the diameters of the left UE AVF.   Based on vein mapping and examination, and after discussing with Dr. Oneida Alar, pt was to return about April 01 2019 with left upper arm AVF duplex, see Dr. Oneida Alar afterward.  She dialyzes M-W-F via the right IJ TDC at [place] in [facility].   This is her first permanent access. She is right hand dominant.   Pt denies hx of stroke, denies hx of Bell's Palsy, has facial asymmetry. 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   Past Medical History:  Diagnosis Date  . Chronic kidney disease   . Diabetes ([place])   . DVT (deep venous thrombosis) ([place])   . E-coli UTI   . Gout 06/05/2018  . HLD (hyperlipidemia) 06/05/2018  . PE (pulmonary embolism)   . 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 AV FISTULA PLACEMENT Left  12/31/2018   Procedure: ARTERIOVENOUS (AV) FISTULA CREATION VERSUS INSERTION OF ARTERIOVENOUS GRAFT LEFT ARM;  Surgeon: Elam Dutch, MD;  Location: [place];  Service: Vascular;  Laterality: Left;  . INSERTION OF DIALYSIS CATHETER N/A 12/31/2018   Procedure: INSERTION OF TUNNELED  DIALYSIS CATHETER;  Surgeon: Elam Dutch, MD;  Location: [facility] OR;  Service: Vascular;  Laterality: N/A;  . NO PAST SURGERIES      Current Meds  Medication Sig  . Amino Acids-Protein Hydrolys (FEEDING SUPPLEMENT, PRO-STAT SUGAR FREE 64,) LIQD Take 30 mLs by mouth 2 (two) times daily.  Marland Kitchen amLODipine (NORVASC) 10 MG tablet Take 1 tablet (10 mg total) by mouth daily for 21 days.  . calcitRIOL (ROCALTROL) 0.5 MCG capsule Take 1 capsule (0.5 mcg total) by mouth daily.  . calcium acetate (PHOSLO) 667 MG capsule Take 2 capsules (1,334 mg total) by mouth 3 (three) times daily with meals.  . carvedilol (COREG) 12.5 MG tablet Take 1 tablet (12.5 mg total) by mouth 2 (two) times daily with a meal for 21 days.  . insulin detemir (LEVEMIR) 100 UNIT/ML injection Inject 0.04 mLs (4 Units total) into the skin at bedtime.  . multivitamin (RENA-VIT) TABS tablet Take 1 tablet by mouth at bedtime.    12 system ROS was negative unless otherwise noted in HPI   Observations/Objective:  Dialysis Access Duplex (06-02-19): +--------------------+----------+-----------------+--------+ AVF  PSV (cm/s)Flow Vol (mL/min)Comments +--------------------+----------+-----------------+--------+ Native artery inflow   294          2582                +--------------------+----------+-----------------+--------+ AVF Anastomosis        823                              +--------------------+----------+-----------------+--------+    +------------+----------+-------------+----------+--------+ OUTFLOW VEINPSV (cm/s)Diameter (cm)Depth (cm)Describe +------------+----------+-------------+----------+--------+ Prox  UA        132        0.87        1.16            +------------+----------+-------------+----------+--------+ Mid UA         266        0.77        1.02            +------------+----------+-------------+----------+--------+ Dist UA        242        0.77        1.06            +------------+----------+-------------+----------+--------+ AC Fossa       685        0.45        0.30            +------------+----------+-------------+----------+--------+ Summary: Patent arteriovenous fistula.    Assessment and Plan:  I discussed results of today's access duplex results and current HPI with Dr. Oneida Alar. Diameters are now large enough to proceed to 2nd stage BVT.  Plan 06-21-19 second stage left basilic vein transposition by Dr. Oneida Alar.   Follow Up Instructions:  Plan 06-21-19 second stage left basilic vein transposition by Dr. Oneida Alar.     I discussed the assessment and treatment plan with the patient. The patient was provided an opportunity to ask questions and all were answered. The patient agreed with the plan and demonstrated an understanding of the instructions.   The patient was advised to call back or seek an in-person evaluation if the symptoms worsen or if the condition fails to improve as anticipated.  I spent 10 minutes with the patient via telephone encounter.   Gabrielle Dare Landry [place] [facility] [place] Office: [phone]  06/02/2019, 3:48 PM

## 2019-06-03 ENCOUNTER — Encounter: Payer: Self-pay | Admitting: *Deleted

## 2019-06-03 ENCOUNTER — Other Ambulatory Visit: Payer: Self-pay | Admitting: *Deleted

## 2019-06-03 DIAGNOSIS — N2581 Secondary hyperparathyroidism of renal origin: Secondary | ICD-10-CM | POA: Diagnosis not present

## 2019-06-03 DIAGNOSIS — N186 End stage renal disease: Secondary | ICD-10-CM | POA: Diagnosis not present

## 2019-06-03 NOTE — Progress Notes (Signed)
Phone call to patient to schedule surgery with Dr. Oneida Alar foe Second stage BVT/left arm. Reviewed all pre-op instructions and arrival time of 6:30 on 06/14/2019. Verbalized understanding and letter of instructions faxed to [facility].

## 2019-06-06 DIAGNOSIS — N2581 Secondary hyperparathyroidism of renal origin: Secondary | ICD-10-CM | POA: Diagnosis not present

## 2019-06-06 DIAGNOSIS — N186 End stage renal disease: Secondary | ICD-10-CM | POA: Diagnosis not present

## 2019-06-07 DIAGNOSIS — Z992 Dependence on renal dialysis: Secondary | ICD-10-CM | POA: Diagnosis not present

## 2019-06-07 DIAGNOSIS — E1122 Type 2 diabetes mellitus with diabetic chronic kidney disease: Secondary | ICD-10-CM | POA: Diagnosis not present

## 2019-06-07 DIAGNOSIS — N186 End stage renal disease: Secondary | ICD-10-CM | POA: Diagnosis not present

## 2019-06-08 DIAGNOSIS — N2581 Secondary hyperparathyroidism of renal origin: Secondary | ICD-10-CM | POA: Diagnosis not present

## 2019-06-08 DIAGNOSIS — N186 End stage renal disease: Secondary | ICD-10-CM | POA: Diagnosis not present

## 2019-06-10 DIAGNOSIS — N2581 Secondary hyperparathyroidism of renal origin: Secondary | ICD-10-CM | POA: Diagnosis not present

## 2019-06-10 DIAGNOSIS — N186 End stage renal disease: Secondary | ICD-10-CM | POA: Diagnosis not present

## 2019-06-10 IMAGING — CR DG CHEST 2V
2 series · 2 of 2 positions shown · non-contrast
Comparison: 06/05/2018

CLINICAL DATA: Dyspnea with leg swelling.

EXAM:
CHEST - 2 VIEW

[chest pa]
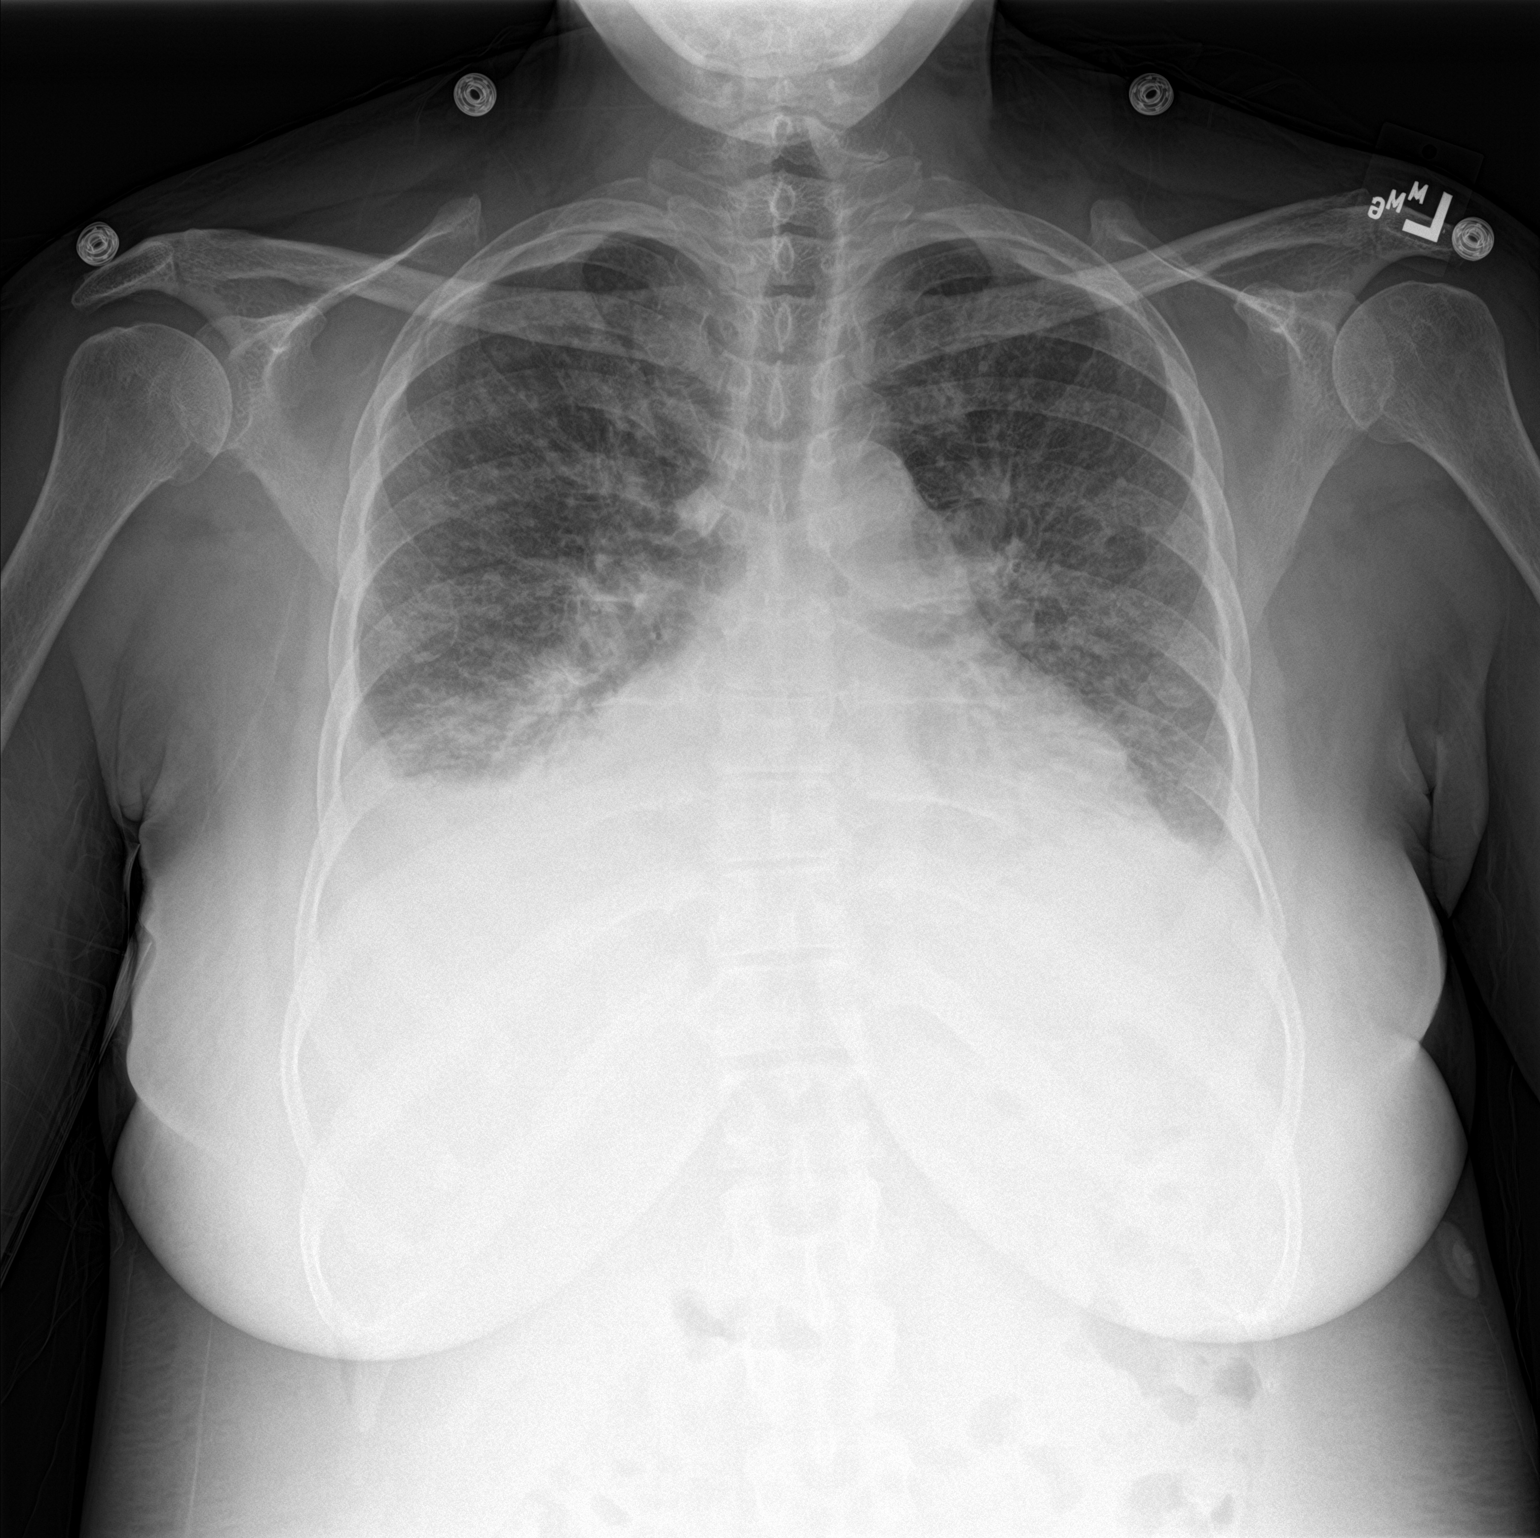

[chest lat]
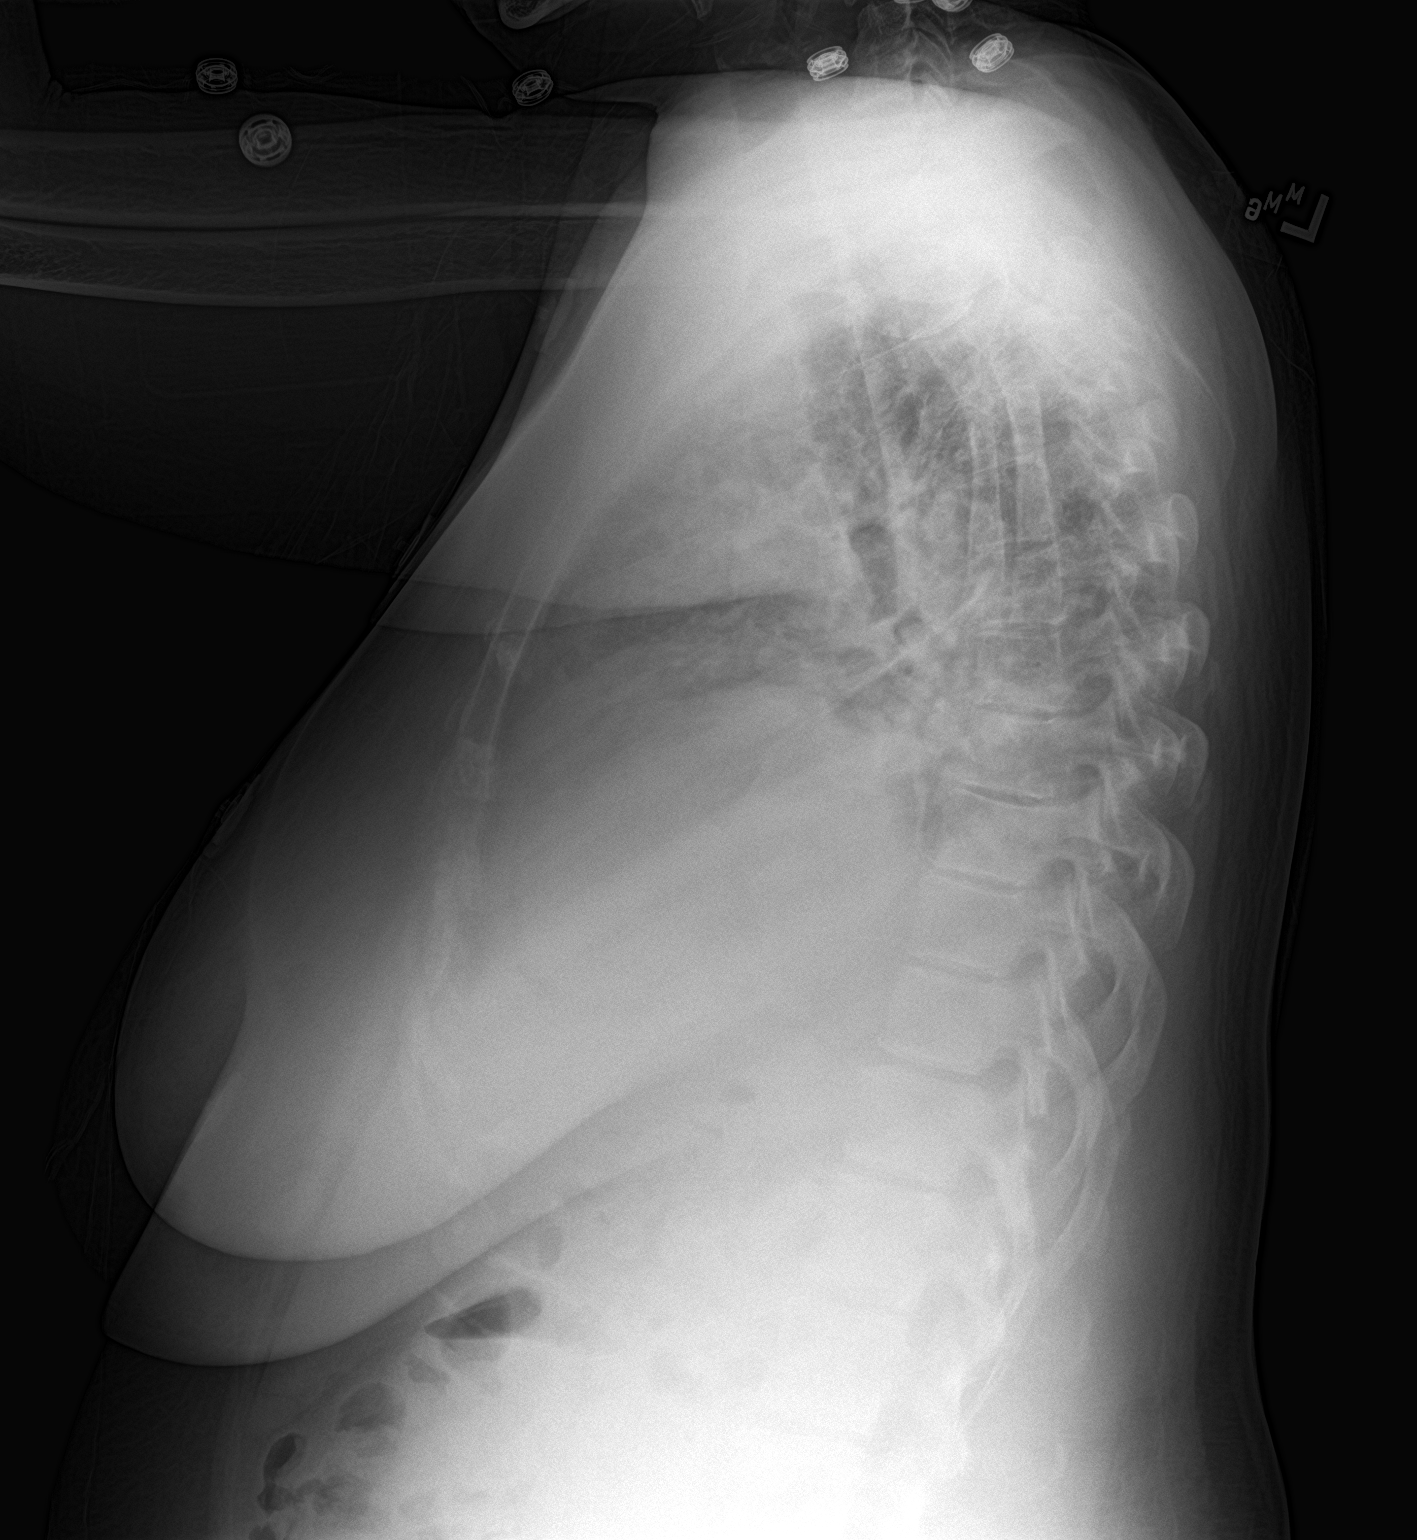

[2 of 2 positions shown; findings below may reference images not displayed]

FINDINGS: Stable cardiomegaly with aortic atherosclerosis. New bilateral small
pleural effusions obscuring the diaphragms are identified with
interstitial edema. No acute osseous abnormality.
IMPRESSION: 1. Cardiomegaly with aortic atherosclerosis.
2. New bilateral small pleural effusions with interstitial edema
consistent with CHF.

## 2019-06-13 ENCOUNTER — Encounter (HOSPITAL_COMMUNITY): Payer: Self-pay | Admitting: *Deleted

## 2019-06-13 ENCOUNTER — Other Ambulatory Visit: Payer: Self-pay

## 2019-06-13 DIAGNOSIS — N186 End stage renal disease: Secondary | ICD-10-CM | POA: Diagnosis not present

## 2019-06-13 DIAGNOSIS — N2581 Secondary hyperparathyroidism of renal origin: Secondary | ICD-10-CM | POA: Diagnosis not present

## 2019-06-13 NOTE — Progress Notes (Signed)
Ms Jaci Standard denies chest pain or shortness of breath. Patient denies that she nor anyone who lives in the house  has experienced any of the following: Cough Fever >100.4 Runny Nose Sore Throat Difficulty breathing/ shortness of breath Travel in past 14 days- no\  Ms Jaci Standard has type II diabetes. Patient reports that she checks CBG 2 hours after she eats and takes SS Insulin, patient reports that CBGs have been running 140- 220 pc. Ms Jaci Standard takes Levemir at hs if CBG is greater than 150, patient reports that she has not had to take any insulin coverage at hs in a long time. I instructed patient to check CBG after awaking and every 2 hours until arrival  to the hospital.  I Instructed patient if CBG is less than 70 to drink 1/2 cup of a clear juice. Recheck CBG in 15 minutes then call pre- op desk at [phone] for further instructions.

## 2019-06-14 ENCOUNTER — Encounter (HOSPITAL_COMMUNITY)
Admission: RE | Disposition: A | Discharge status: Home / Self Care | Payer: Self-pay | Source: Home / Self Care | Attending: Vascular Surgery

## 2019-06-14 ENCOUNTER — Encounter (HOSPITAL_COMMUNITY): Payer: Self-pay

## 2019-06-14 ENCOUNTER — Ambulatory Visit (HOSPITAL_COMMUNITY): Payer: Federal, State, Local not specified - PPO | Admitting: Anesthesiology

## 2019-06-14 ENCOUNTER — Ambulatory Visit (HOSPITAL_COMMUNITY)
Admission: RE | Admit: 2019-06-14 | Discharge: 2019-06-14 | Disposition: A | Discharge status: Home / Self Care | Payer: Federal, State, Local not specified - PPO | Attending: Vascular Surgery | Admitting: Vascular Surgery

## 2019-06-14 DIAGNOSIS — Z1159 Encounter for screening for other viral diseases: Secondary | ICD-10-CM | POA: Insufficient documentation

## 2019-06-14 DIAGNOSIS — Z794 Long term (current) use of insulin: Secondary | ICD-10-CM | POA: Insufficient documentation

## 2019-06-14 DIAGNOSIS — Z992 Dependence on renal dialysis: Secondary | ICD-10-CM | POA: Insufficient documentation

## 2019-06-14 DIAGNOSIS — Z86711 Personal history of pulmonary embolism: Secondary | ICD-10-CM | POA: Insufficient documentation

## 2019-06-14 DIAGNOSIS — E119 Type 2 diabetes mellitus without complications: Secondary | ICD-10-CM | POA: Diagnosis not present

## 2019-06-14 DIAGNOSIS — Z86718 Personal history of other venous thrombosis and embolism: Secondary | ICD-10-CM | POA: Insufficient documentation

## 2019-06-14 DIAGNOSIS — I10 Essential (primary) hypertension: Secondary | ICD-10-CM | POA: Diagnosis not present

## 2019-06-14 DIAGNOSIS — D631 Anemia in chronic kidney disease: Secondary | ICD-10-CM | POA: Diagnosis not present

## 2019-06-14 DIAGNOSIS — I120 Hypertensive chronic kidney disease with stage 5 chronic kidney disease or end stage renal disease: Secondary | ICD-10-CM | POA: Insufficient documentation

## 2019-06-14 DIAGNOSIS — Z79899 Other long term (current) drug therapy: Secondary | ICD-10-CM | POA: Insufficient documentation

## 2019-06-14 DIAGNOSIS — N186 End stage renal disease: Secondary | ICD-10-CM | POA: Diagnosis not present

## 2019-06-14 DIAGNOSIS — E1122 Type 2 diabetes mellitus with diabetic chronic kidney disease: Secondary | ICD-10-CM | POA: Diagnosis not present

## 2019-06-14 HISTORY — DX: Essential (primary) hypertension: I10

## 2019-06-14 HISTORY — PX: ARTERY EXPLORATION: SHX5110

## 2019-06-14 HISTORY — DX: Anemia, unspecified: D64.9

## 2019-06-14 HISTORY — PX: BASCILIC VEIN TRANSPOSITION: SHX5742

## 2019-06-14 HISTORY — DX: Personal history of other medical treatment: Z92.89

## 2019-06-14 LAB — GLUCOSE, CAPILLARY
Glucose-Capillary: 126 mg/dL — ABNORMAL HIGH (ref 70–99)
Glucose-Capillary: 236 mg/dL — ABNORMAL HIGH (ref 70–99)

## 2019-06-14 LAB — SARS CORONAVIRUS 2 BY RT PCR (HOSPITAL ORDER, PERFORMED IN ~~LOC~~ HOSPITAL LAB): SARS Coronavirus 2: NEGATIVE

## 2019-06-14 LAB — POCT I-STAT 4, (NA,K, GLUC, HGB,HCT)
Glucose, Bld: 141 mg/dL — ABNORMAL HIGH (ref 70–99)
HCT: 35 % — ABNORMAL LOW (ref 36.0–46.0)
Hemoglobin: 11.9 g/dL — ABNORMAL LOW (ref 12.0–15.0)
Potassium: 4.2 mmol/L (ref 3.5–5.1)
Sodium: 138 mmol/L (ref 135–145)

## 2019-06-14 SURGERY — TRANSPOSITION, VEIN, BASILIC
Anesthesia: General | Site: Arm Upper | Laterality: Left

## 2019-06-14 MED ORDER — PROPOFOL 10 MG/ML IV BOLUS
INTRAVENOUS | Status: AC
  Filled 2019-06-14: qty 20

## 2019-06-14 MED ORDER — LIDOCAINE 2% (20 MG/ML) 5 ML SYRINGE
INTRAMUSCULAR | Status: AC
  Filled 2019-06-14: qty 5

## 2019-06-14 MED ORDER — PROTAMINE SULFATE 10 MG/ML IV SOLN
INTRAVENOUS | Status: DC | PRN
  Administered 2019-06-14: 20 mg via INTRAVENOUS
  Administered 2019-06-14: 10 mg via INTRAVENOUS
  Administered 2019-06-14: 20 mg via INTRAVENOUS

## 2019-06-14 MED ORDER — SODIUM CHLORIDE 0.9 % IV SOLN
INTRAVENOUS | Status: AC
  Filled 2019-06-14: qty 1.2

## 2019-06-14 MED ORDER — 0.9 % SODIUM CHLORIDE (POUR BTL) OPTIME
TOPICAL | Status: DC | PRN
  Administered 2019-06-14: 1000 mL

## 2019-06-14 MED ORDER — LIDOCAINE HCL (PF) 1 % IJ SOLN
INTRAMUSCULAR | Status: DC | PRN
  Administered 2019-06-14: 30 mL

## 2019-06-14 MED ORDER — OXYCODONE-ACETAMINOPHEN 5-325 MG PO TABS: 2.0000 | ORAL_TABLET | Freq: Four times a day (QID) | ORAL | 0 refills | Status: DC | PRN

## 2019-06-14 MED ORDER — CEFAZOLIN SODIUM-DEXTROSE 2-4 GM/100ML-% IV SOLN
2.0000 g | INTRAVENOUS | Status: AC
  Administered 2019-06-14: 2 g via INTRAVENOUS

## 2019-06-14 MED ORDER — LIDOCAINE HCL 1 % IJ SOLN
INTRAMUSCULAR | Status: DC | PRN
  Administered 2019-06-14: 30 mL

## 2019-06-14 MED ORDER — HEPARIN SODIUM (PORCINE) 1000 UNIT/ML IJ SOLN
INTRAMUSCULAR | Status: DC | PRN
  Administered 2019-06-14: 5000 [IU] via INTRAVENOUS

## 2019-06-14 MED ORDER — FENTANYL CITRATE (PF) 250 MCG/5ML IJ SOLN
INTRAMUSCULAR | Status: DC | PRN
  Administered 2019-06-14 (×2): 25 ug via INTRAVENOUS
  Administered 2019-06-14: 50 ug via INTRAVENOUS
  Administered 2019-06-14: 75 ug via INTRAVENOUS
  Administered 2019-06-14: 25 ug via INTRAVENOUS

## 2019-06-14 MED ORDER — HYDROMORPHONE HCL 1 MG/ML IJ SOLN: 0.2500 mg | INTRAMUSCULAR | Status: DC | PRN

## 2019-06-14 MED ORDER — LIDOCAINE 2% (20 MG/ML) 5 ML SYRINGE
INTRAMUSCULAR | Status: DC | PRN
  Administered 2019-06-14: 60 mg via INTRAVENOUS

## 2019-06-14 MED ORDER — ACETAMINOPHEN 500 MG PO TABS: 1000.0000 mg | ORAL_TABLET | Freq: Once | ORAL | Status: DC

## 2019-06-14 MED ORDER — SODIUM CHLORIDE 0.9 % IV SOLN
INTRAVENOUS | Status: DC | PRN
  Administered 2019-06-14: 500 mL

## 2019-06-14 MED ORDER — DEXAMETHASONE SODIUM PHOSPHATE 10 MG/ML IJ SOLN
INTRAMUSCULAR | Status: DC | PRN
  Administered 2019-06-14: 5 mg via INTRAVENOUS

## 2019-06-14 MED ORDER — LIDOCAINE HCL (PF) 1 % IJ SOLN
INTRAMUSCULAR | Status: AC
  Filled 2019-06-14: qty 30

## 2019-06-14 MED ORDER — CHLORHEXIDINE GLUCONATE 4 % EX LIQD: 60.0000 mL | Freq: Once | CUTANEOUS | Status: DC

## 2019-06-14 MED ORDER — SODIUM CHLORIDE 0.9 % IV SOLN
INTRAVENOUS | Status: DC | PRN
  Administered 2019-06-14: 30 ug/min via INTRAVENOUS

## 2019-06-14 MED ORDER — MIDAZOLAM HCL 5 MG/5ML IJ SOLN
INTRAMUSCULAR | Status: DC | PRN
  Administered 2019-06-14 (×2): 1 mg via INTRAVENOUS

## 2019-06-14 MED ORDER — FENTANYL CITRATE (PF) 250 MCG/5ML IJ SOLN
INTRAMUSCULAR | Status: AC
  Filled 2019-06-14: qty 5

## 2019-06-14 MED ORDER — ONDANSETRON HCL 4 MG/2ML IJ SOLN
INTRAMUSCULAR | Status: DC | PRN
  Administered 2019-06-14: 4 mg via INTRAVENOUS

## 2019-06-14 MED ORDER — PROTAMINE SULFATE 10 MG/ML IV SOLN
INTRAVENOUS | Status: AC
  Filled 2019-06-14: qty 5

## 2019-06-14 MED ORDER — SUCCINYLCHOLINE CHLORIDE 200 MG/10ML IV SOSY
PREFILLED_SYRINGE | INTRAVENOUS | Status: AC
  Filled 2019-06-14: qty 10

## 2019-06-14 MED ORDER — ONDANSETRON HCL 4 MG/2ML IJ SOLN
INTRAMUSCULAR | Status: AC
  Filled 2019-06-14: qty 6

## 2019-06-14 MED ORDER — MIDAZOLAM HCL 2 MG/2ML IJ SOLN
INTRAMUSCULAR | Status: AC
  Filled 2019-06-14: qty 2

## 2019-06-14 MED ORDER — PROPOFOL 10 MG/ML IV BOLUS
INTRAVENOUS | Status: DC | PRN
  Administered 2019-06-14: 200 mg via INTRAVENOUS

## 2019-06-14 MED ORDER — PROPOFOL 500 MG/50ML IV EMUL
INTRAVENOUS | Status: DC | PRN
  Administered 2019-06-14: 75 ug/kg/min via INTRAVENOUS

## 2019-06-14 MED ORDER — SODIUM CHLORIDE 0.9 % IV SOLN
INTRAVENOUS | Status: DC
  Administered 2019-06-14 (×2): via INTRAVENOUS

## 2019-06-14 MED ORDER — ONDANSETRON HCL 4 MG/2ML IJ SOLN
INTRAMUSCULAR | Status: AC
  Filled 2019-06-14: qty 2

## 2019-06-14 SURGICAL SUPPLY — 34 items
ARMBAND PINK RESTRICT EXTREMIT (MISCELLANEOUS) ×3 IMPLANT
CANISTER SUCT 3000ML PPV (MISCELLANEOUS) ×3 IMPLANT
CANNULA VESSEL 3MM 2 BLNT TIP (CANNULA) ×3 IMPLANT
CLIP VESOCCLUDE MED 24/CT (CLIP) ×2 IMPLANT
CLIP VESOCCLUDE SM WIDE 24/CT (CLIP) ×2 IMPLANT
COVER LIGHT HANDLE  DEROYL (MISCELLANEOUS) ×1 IMPLANT
COVER WAND RF STERILE (DRAPES) ×2 IMPLANT
DECANTER SPIKE VIAL GLASS SM (MISCELLANEOUS) ×2 IMPLANT
DERMABOND ADVANCED (GAUZE/BANDAGES/DRESSINGS) ×2
DERMABOND ADVANCED .7 DNX12 (GAUZE/BANDAGES/DRESSINGS) ×1 IMPLANT
ELECT REM PT RETURN 9FT ADLT (ELECTROSURGICAL) ×4
ELECTRODE REM PT RTRN 9FT ADLT (ELECTROSURGICAL) ×1 IMPLANT
GLOVE BIO SURGEON STRL SZ7.5 (GLOVE) ×3 IMPLANT
GOWN STRL REUS W/ TWL LRG LVL3 (GOWN DISPOSABLE) ×3 IMPLANT
GOWN STRL REUS W/TWL LRG LVL3 (GOWN DISPOSABLE) ×7
HEMOSTAT SPONGE AVITENE ULTRA (HEMOSTASIS) IMPLANT
KIT BASIN OR (CUSTOM PROCEDURE TRAY) ×3 IMPLANT
KIT TURNOVER KIT B (KITS) ×3 IMPLANT
NS IRRIG 1000ML POUR BTL (IV SOLUTION) ×4 IMPLANT
PACK CV ACCESS (CUSTOM PROCEDURE TRAY) ×3 IMPLANT
PAD ARMBOARD 7.5X6 YLW CONV (MISCELLANEOUS) ×4 IMPLANT
SPONGE LAP 18X18 RF (DISPOSABLE) ×1 IMPLANT
SUT PROLENE 6 0 CC (SUTURE) ×4 IMPLANT
SUT SILK 2 0 SH (SUTURE) ×1 IMPLANT
SUT SILK 3 0 (SUTURE) ×2
SUT SILK 3-0 18XBRD TIE 12 (SUTURE) IMPLANT
SUT VIC AB 3-0 SH 27 (SUTURE) ×6
SUT VIC AB 3-0 SH 27X BRD (SUTURE) ×1 IMPLANT
SUT VIC AB 4-0 PS2 18 (SUTURE) ×1 IMPLANT
SUT VICRYL 4-0 PS2 18IN ABS (SUTURE) ×4 IMPLANT
SYR BULB IRRIGATION 50ML (SYRINGE) ×1 IMPLANT
TOWEL GREEN STERILE (TOWEL DISPOSABLE) ×3 IMPLANT
UNDERPAD 30X30 (UNDERPADS AND DIAPERS) ×2 IMPLANT
WATER STERILE IRR 1000ML POUR (IV SOLUTION) ×2 IMPLANT

## 2019-06-14 NOTE — Interval H&P Note (Signed)
History and Physical Interval Note:  06/14/2019 7:53 AM  Marcille Blanco Henneke  has presented today for surgery, with the diagnosis of END STAGE RENAL DISEASE FOR HEMODIALYSIS ACCESS.  The various methods of treatment have been discussed with the patient and family. After consideration of risks, benefits and other options for treatment, the patient has consented to  Procedure(s): SECOND STAGE BASILIC VEIN TRANSPOSITION LEFT ARM (Left) as a surgical intervention.  The patient's history has been reviewed, patient examined, no change in status, stable for surgery.  I have reviewed the patient's chart and labs.  Questions were answered to the patient's satisfaction.     [name]

## 2019-06-14 NOTE — Op Note (Signed)
Procedure: Second stage basilic vein transposition left arm  Preoperative diagnosis: End-stage renal disease  Postoperative diagnosis: Same  Anesthesia: General  Assistant: Izetta Dakin, RNFA  Operative findings: 6 mm basilic vein  Operative details: After obtaining informed consent, patient was taken the operating.  The patient was placed in supine position operating table.  After induction of general anesthesia and endotracheal intubation, patient's entire left upper extremity was prepped and draped in usual sterile fashion.  Transverse incision was made through pre-existing scar near the antecubital area.  Incision was carried out through subcutaneous tissues down the level of the fistula.  The fistula was dissected free circumferentially all the way up to the level of previous anastomosis.  I did not think that I had enough length of the vein at this point to re-tunneled a vein over the biceps muscle.  Therefore I decided to superficial lyse this taking it from a deep subfascial position to a subcutaneous position with the vein in its anatomical bed.  The vein was dissected free circumferentially all the way up to the level of the axilla ligating dividing silk ties several every single vein branch.  The vein was entangled in sensory nerves that a couple of different areas.  So I decided to transect the vein and bring this superficial to all of the nerves.  Patient was given 5000 units of intravenous heparin.  After 2 minutes circulation time, the vein was divided about 4 cm from the anastomosis.  The entire vein was then pulled through so that it was not entangled in any other sensory nerve structures.  A new anastomosis was then created in the end using a running 6-0 Prolene suture.  Just prior to completion the anastomosis it was for blood backbled and thoroughly flushed.  Anastomosis was secured clamps released there is palpable thrill in the fistula immediately.  Hemostasis was obtained with  direct pressure and 50 mg of protamine.  The subcutaneous tissues were reapproximated underneath the fistula with several interrupted 3-0 Vicryl sutures.  The skin of all 4 incisions was then closed with a running 4-0 Vicryl subcuticular stitch.  Dermabond was then applied.  Patient was then transferred from the operating room bed to the transport bed.  At this point the patient developed hematoma in the upper arm.  The patient was placed back on the operating table and local anesthesia infiltrated over all of her incisions.  The upper 3 incisions were then reopened and there was a large amount of hematoma within this.  No active bleeding was encountered until open the axillary incision.  A silk tie had fallen off of 1 of the side branches.  There was active bleeding.  This was repaired with a single figure-of-eight 6-0 Prolene suture.  Hemostasis was obtained.  The arm was thoroughly irrigated with normal saline solution.  I then placed several subcutaneous tacking stitches again with 3-0 Vicryl to bring the vein up to the skin surface.  Each skin incision was then closed with a 4-0 Vicryl subcuticular stitch.  Dermabond was applied.  Patient tolerated procedure well and there were no complications.  The sponge and needle count was correct the end of the case.  Patient was taken the recovery room in stable condition.  [name], MD [facility] [place] Office: [phone] Pager: [phone]

## 2019-06-14 NOTE — Anesthesia Postprocedure Evaluation (Signed)
Anesthesia Post Note  Patient: Colleen [name]  Procedure(s) Performed: SECOND STAGE BASILIC VEIN TRANSPOSITION LEFT ARM (Left Arm Upper) Artery Exploration Left Upper Arm (Left Arm Upper)     Patient location during evaluation: PACU Anesthesia Type: General Level of consciousness: awake and alert Pain management: pain level controlled Vital Signs Assessment: post-procedure vital signs reviewed and stable Respiratory status: spontaneous breathing, nonlabored ventilation and respiratory function stable Cardiovascular status: blood pressure returned to baseline and stable Postop Assessment: no apparent nausea or vomiting Anesthetic complications: no    Last Vitals:  Vitals:   06/14/19 1425 06/14/19 1440  BP: 124/65 126/65  Pulse: 79 78  Resp: 14 14  Temp:    SpO2: 98% 96%    Last Pain:  Vitals:   06/14/19 1405  TempSrc:   PainSc: 0-No pain                 Monteen Toops,W. EDMOND

## 2019-06-14 NOTE — Transfer of Care (Signed)
Immediate Anesthesia Transfer of Care Note  Patient: Colleen [name]  Procedure(s) Performed: SECOND STAGE BASILIC VEIN TRANSPOSITION LEFT ARM (Left Arm Upper) Artery Exploration Left Upper Arm (Left Arm Upper)  Patient Location: PACU  Anesthesia Type:MAC and General  Level of Consciousness: awake, alert , oriented and patient cooperative  Airway & Oxygen Therapy: Patient Spontanous Breathing and Patient connected to face mask oxygen  Post-op Assessment: Report given to RN, Post -op Vital signs reviewed and stable and Patient moving all extremities  Post vital signs: Reviewed and stable  Last Vitals:  Vitals Value Taken Time  BP 115/66 06/14/19 1408  Temp 36.4 C 06/14/19 1405  Pulse 81 06/14/19 1409  Resp 15 06/14/19 1409  SpO2 96 % 06/14/19 1409  Vitals shown include unvalidated device data.  Last Pain:  Vitals:   06/14/19 1405  TempSrc:   PainSc: 0-No pain      Patients Stated Pain Goal: 0 (26/37/85 8850)  Complications: No apparent anesthesia complications

## 2019-06-14 NOTE — Anesthesia Procedure Notes (Signed)
Procedure Name: LMA Insertion Date/Time: 06/14/2019 10:42 AM Performed by: Myna Bright, CRNA Pre-anesthesia Checklist: Emergency Drugs available, Patient identified, Suction available and Patient being monitored Patient Re-evaluated:Patient Re-evaluated prior to induction Oxygen Delivery Method: Circle system utilized Preoxygenation: Pre-oxygenation with 100% oxygen Induction Type: IV induction Ventilation: Mask ventilation without difficulty LMA: LMA inserted LMA Size: 4.0 Number of attempts: 1 Placement Confirmation: positive ETCO2 and breath sounds checked- equal and bilateral Tube secured with: Tape Dental Injury: Teeth and Oropharynx as per pre-operative assessment

## 2019-06-14 NOTE — Anesthesia Preprocedure Evaluation (Addendum)
Anesthesia Evaluation  Patient identified by MRN, date of birth, ID band Patient awake    Reviewed: Allergy & Precautions, H&P , NPO status , Patient's Chart, lab work & pertinent test results, reviewed documented beta blocker date and time   Airway Mallampati: III  TM Distance: >3 FB Neck ROM: Full    Dental no notable dental hx. (+) Teeth Intact, Dental Advisory Given   Pulmonary neg pulmonary ROS,    Pulmonary exam normal breath sounds clear to auscultation       Cardiovascular hypertension, Pt. on medications and Pt. on home beta blockers  Rhythm:Regular Rate:Normal     Neuro/Psych negative neurological ROS  negative psych ROS   GI/Hepatic negative GI ROS, Neg liver ROS,   Endo/Other  diabetes, Insulin Dependent  Renal/GU ESRF and DialysisRenal disease  negative genitourinary   Musculoskeletal   Abdominal   Peds  Hematology  (+) Blood dyscrasia, anemia ,   Anesthesia Other Findings   Reproductive/Obstetrics negative OB ROS                            Anesthesia Physical Anesthesia Plan  ASA: III  Anesthesia Plan: General   Post-op Pain Management:    Induction: Intravenous  PONV Risk Score and Plan: 3 and Ondansetron, Propofol infusion and Midazolam  Airway Management Planned: LMA  Additional Equipment:   Intra-op Plan:   Post-operative Plan:   Informed Consent: I have reviewed the patients History and Physical, chart, labs and discussed the procedure including the risks, benefits and alternatives for the proposed anesthesia with the patient or authorized representative who has indicated his/her understanding and acceptance.     Dental advisory given  Plan Discussed with: CRNA  Anesthesia Plan Comments:        Anesthesia Quick Evaluation

## 2019-06-15 ENCOUNTER — Encounter (HOSPITAL_COMMUNITY): Payer: Self-pay | Admitting: Vascular Surgery

## 2019-06-15 ENCOUNTER — Emergency Department (HOSPITAL_COMMUNITY)
Admission: EM | Admit: 2019-06-15 | Discharge: 2019-06-15 | Disposition: A | Discharge status: Home / Self Care | Payer: Federal, State, Local not specified - PPO | Attending: Emergency Medicine | Admitting: Emergency Medicine

## 2019-06-15 DIAGNOSIS — N2581 Secondary hyperparathyroidism of renal origin: Secondary | ICD-10-CM | POA: Diagnosis not present

## 2019-06-15 DIAGNOSIS — N186 End stage renal disease: Secondary | ICD-10-CM | POA: Diagnosis not present

## 2019-06-15 DIAGNOSIS — Z79899 Other long term (current) drug therapy: Secondary | ICD-10-CM | POA: Insufficient documentation

## 2019-06-15 DIAGNOSIS — E1165 Type 2 diabetes mellitus with hyperglycemia: Secondary | ICD-10-CM | POA: Diagnosis not present

## 2019-06-15 DIAGNOSIS — R739 Hyperglycemia, unspecified: Secondary | ICD-10-CM

## 2019-06-15 DIAGNOSIS — I120 Hypertensive chronic kidney disease with stage 5 chronic kidney disease or end stage renal disease: Secondary | ICD-10-CM | POA: Insufficient documentation

## 2019-06-15 DIAGNOSIS — E1122 Type 2 diabetes mellitus with diabetic chronic kidney disease: Secondary | ICD-10-CM | POA: Insufficient documentation

## 2019-06-15 LAB — CBC WITH DIFFERENTIAL/PLATELET
Abs Immature Granulocytes: 0.04 10*3/uL (ref 0.00–0.07)
Basophils Absolute: 0.1 10*3/uL (ref 0.0–0.1)
Basophils Relative: 1 %
Eosinophils Absolute: 0 10*3/uL (ref 0.0–0.5)
Eosinophils Relative: 0 %
HCT: 28.6 % — ABNORMAL LOW (ref 36.0–46.0)
Hemoglobin: 8.8 g/dL — ABNORMAL LOW (ref 12.0–15.0)
Immature Granulocytes: 1 %
Lymphocytes Relative: 14 %
Lymphs Abs: 1.2 10*3/uL (ref 0.7–4.0)
MCH: 25.4 pg — ABNORMAL LOW (ref 26.0–34.0)
MCHC: 30.8 g/dL (ref 30.0–36.0)
MCV: 82.7 fL (ref 80.0–100.0)
Monocytes Absolute: 1.2 10*3/uL — ABNORMAL HIGH (ref 0.1–1.0)
Monocytes Relative: 13 %
Neutro Abs: 6.3 10*3/uL (ref 1.7–7.7)
Neutrophils Relative %: 71 %
Platelets: 245 10*3/uL (ref 150–400)
RBC: 3.46 MIL/uL — ABNORMAL LOW (ref 3.87–5.11)
RDW: 18.4 % — ABNORMAL HIGH (ref 11.5–15.5)
WBC: 8.8 10*3/uL (ref 4.0–10.5)
nRBC: 0 % (ref 0.0–0.2)

## 2019-06-15 LAB — POCT I-STAT EG7
Acid-Base Excess: 2 mmol/L (ref 0.0–2.0)
Bicarbonate: 26.1 mmol/L (ref 20.0–28.0)
Calcium, Ion: 0.97 mmol/L — ABNORMAL LOW (ref 1.15–1.40)
HCT: 30 % — ABNORMAL LOW (ref 36.0–46.0)
Hemoglobin: 10.2 g/dL — ABNORMAL LOW (ref 12.0–15.0)
O2 Saturation: 96 %
Potassium: 4.3 mmol/L (ref 3.5–5.1)
Sodium: 134 mmol/L — ABNORMAL LOW (ref 135–145)
TCO2: 27 mmol/L (ref 22–32)
pCO2, Ven: 36 mmHg — ABNORMAL LOW (ref 44.0–60.0)
pH, Ven: 7.468 — ABNORMAL HIGH (ref 7.250–7.430)
pO2, Ven: 78 mmHg — ABNORMAL HIGH (ref 32.0–45.0)

## 2019-06-15 LAB — COMPREHENSIVE METABOLIC PANEL
ALT: 5 U/L (ref 0–44)
AST: 11 U/L — ABNORMAL LOW (ref 15–41)
Albumin: 3 g/dL — ABNORMAL LOW (ref 3.5–5.0)
Alkaline Phosphatase: 93 U/L (ref 38–126)
Anion gap: 17 — ABNORMAL HIGH (ref 5–15)
BUN: 58 mg/dL — ABNORMAL HIGH (ref 6–20)
CO2: 24 mmol/L (ref 22–32)
Calcium: 8.8 mg/dL — ABNORMAL LOW (ref 8.9–10.3)
Chloride: 91 mmol/L — ABNORMAL LOW (ref 98–111)
Creatinine, Ser: 10.54 mg/dL — ABNORMAL HIGH (ref 0.44–1.00)
GFR calc Af Amer: 4 mL/min — ABNORMAL LOW (ref >60)
GFR calc non Af Amer: 4 mL/min — ABNORMAL LOW (ref >60)
Glucose, Bld: 359 mg/dL — ABNORMAL HIGH (ref 70–99)
Potassium: 4.2 mmol/L (ref 3.5–5.1)
Sodium: 132 mmol/L — ABNORMAL LOW (ref 135–145)
Total Bilirubin: 0.4 mg/dL (ref 0.3–1.2)
Total Protein: 7.8 g/dL (ref 6.5–8.1)

## 2019-06-15 LAB — URINALYSIS, ROUTINE W REFLEX MICROSCOPIC
Bilirubin Urine: NEGATIVE
Glucose, UA: >=500 mg/dL — AB
Hgb urine dipstick: NEGATIVE
Ketones, ur: NEGATIVE mg/dL
Nitrite: NEGATIVE
Protein, ur: >=300 mg/dL — AB
Specific Gravity, Urine: 1.014 (ref 1.005–1.030)
pH: 8 (ref 5.0–8.0)

## 2019-06-15 LAB — CBG MONITORING, ED
Glucose-Capillary: 438 mg/dL — ABNORMAL HIGH (ref 70–99)
Glucose-Capillary: 246 mg/dL — ABNORMAL HIGH (ref 70–99)

## 2019-06-15 MED ORDER — INSULIN DETEMIR 100 UNIT/ML ~~LOC~~ SOLN
4.0000 [IU] | Freq: Once | SUBCUTANEOUS | Status: AC
  Administered 2019-06-15: 4 [IU] via SUBCUTANEOUS
  Filled 2019-06-15: qty 0.04

## 2019-06-15 MED ORDER — SODIUM CHLORIDE 0.9 % IV BOLUS
500.0000 mL | Freq: Once | INTRAVENOUS | Status: AC
  Administered 2019-06-15: 500 mL via INTRAVENOUS

## 2019-06-15 NOTE — Progress Notes (Signed)
VAST arrived at bedside in ED. IV obtained by ER nurse upon arrival.

## 2019-06-15 NOTE — ED Provider Notes (Signed)
[name] Provider Note   CSN: [id number] Arrival date & time: 06/15/19  2563    History   Chief Complaint Chief Complaint  Patient presents with  . Hyperglycemia    HPI Colleen [name] is a 58 y.o. female.     58yo F w/ PMH including ESRD on HD, IDDM, DVT/PE, HTN, HLD who p/w hyperglycemia.  She had surgery yesterday for AV fistula placement. She received oxycodone after surgery which she says makes her BG spike. Last night before bed, her BG read "high" so she took 10u humalog then went to sleep. This morning it was still running high. She took 10u again and went to dialysis, took oxycodone before getting there. Once there, she was still hyperglycemic so they sent her to ED. No N/V/D, fevers, changes to diet, changes to medications, or urinary symptoms.   The history is provided by the patient.    Past Medical History:  Diagnosis Date  . Anemia   . Chronic kidney disease    ESRD [place] -  . Diabetes ([place])    type II  . DVT (deep venous thrombosis) ([place])   . E-coli UTI   . Gout 06/05/2018  . History of blood transfusion   . HLD (hyperlipidemia) 06/05/2018  . Hypertension   . PE (pulmonary embolism)   . 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 Acute on chronic kidney failure ([place]) 12/31/2018  . Uremia 12/31/2018  . Hyperkalemia, diminished renal excretion 12/31/2018  . Metabolic acidosis, NAG, failure of bicarbonate regeneration 12/31/2018  . Long term (current) use of anticoagulants 09/07/2018  . DVT (deep venous thrombosis) ([place]) 08/24/2018  . Blindness of left eye 08/24/2018  . Acute kidney injury superimposed on CKD ([place]) 06/06/2018  . Left knee pain 06/06/2018  . Anemia due to chronic kidney disease 06/06/2018  . Hyponatremia 06/06/2018  . HLD (hyperlipidemia) 06/05/2018  . Hyperglycemia 06/05/2018  . Gout  06/05/2018  . Swelling of lower extremity 05/28/2018  . CKD (chronic kidney disease) stage 5, GFR less than 15 ml/min (HCC) 05/28/2018  . Hypochromic microcytic anemia 05/28/2018  . Lower extremity edema 05/28/2018  . Callosity 12/11/2015  . Paronychia of right middle finger 10/10/2015  . Coma of unknown cause ([place]) 10/01/2015  . Facial palsy as birth trauma 10/01/2015  . HTN (hypertension) 10/06/2013  . Pulmonary embolism ([place]) 10/06/2013  . Congestive dilated cardiomyopathy ([place]) 10/05/2013  . Carotid bruit 10/05/2013  . DM (diabetes mellitus), type 2, uncontrolled ([place]) 10/04/2013  . Uncontrolled diabetes mellitus type 2 without complications ([place]) 89/37/3428  . Acute renal insufficiency 09/27/2013  . Septic shock(785.52) 09/25/2013  . Bacteremia 09/25/2013    Past Surgical History:  Procedure Laterality Date  . ARTERY EXPLORATION Left 06/14/2019   Procedure: Artery Exploration Left Upper Arm;  Surgeon: Elam Dutch, MD;  Location: [facility] OR;  Service: Vascular;  Laterality: Left;  . AV FISTULA PLACEMENT Left 12/31/2018   Procedure: ARTERIOVENOUS (AV) FISTULA CREATION VERSUS INSERTION OF ARTERIOVENOUS GRAFT LEFT ARM;  Surgeon: Elam Dutch, MD;  Location: [facility];  Service: Vascular;  Laterality: Left;  . BASCILIC VEIN TRANSPOSITION Left 06/14/2019   Procedure: SECOND STAGE BASILIC VEIN TRANSPOSITION LEFT ARM;  Surgeon: Elam Dutch, MD;  Location: [facility];  Service: Vascular;  Laterality: Left;  . INSERTION OF DIALYSIS CATHETER N/A 12/31/2018  Procedure: INSERTION OF TUNNELED  DIALYSIS CATHETER;  Surgeon: Elam Dutch, MD;  Location: [facility] OR;  Service: Vascular;  Laterality: N/A;     OB History   No obstetric history on file.      Home Medications    Prior to Admission medications   Medication Sig Start Date End Date Taking? Authorizing Provider  amLODipine (NORVASC) 10 MG tablet Take 1 tablet (10 mg total) by mouth daily for 21 days. 12/06/18 12/31/26 Yes Volanda Napoleon, PA-C  calcitRIOL (ROCALTROL) 0.25 MCG capsule Take 0.25 mcg by mouth every Monday, Wednesday, and Friday.   Yes [provider]  carvedilol (COREG) 12.5 MG tablet Take 1 tablet (12.5 mg total) by mouth 2 (two) times daily with a meal for 21 days. 12/06/18 12/31/26 Yes Volanda Napoleon, PA-C  ferric citrate (AURYXIA) 1 GM 210 MG(Fe) tablet Take 210 mg by mouth 3 (three) times daily with meals.   Yes [provider]  HUMALOG KWIKPEN 100 UNIT/ML KwikPen Inject 2-10 Units as directed 3 (three) times daily after meals. 2 hours after meals 05/17/19  Yes [provider]  insulin detemir (LEVEMIR) 100 UNIT/ML injection Inject 0.04 mLs (4 Units total) into the skin at bedtime. Patient taking differently: Inject 4 Units into the skin See admin instructions. In the evening If blood sugar over 150 01/05/19  Yes Sheikh, Omair Latif, DO  oxyCODONE-acetaminophen (PERCOCET/ROXICET) 5-325 MG tablet Take 2 tablets by mouth every 6 (six) hours as needed. Patient taking differently: Take 2 tablets by mouth every 6 (six) hours as needed for moderate pain.  06/14/19  Yes Fields, Jessy Oto, MD  Amino Acids-Protein Hydrolys (FEEDING SUPPLEMENT, PRO-STAT SUGAR FREE 64,) LIQD Take 30 mLs by mouth 2 (two) times daily. Patient not taking: Reported on 06/13/2019 01/05/19   Raiford Noble Latif, DO  calcium acetate (PHOSLO) 667 MG capsule Take 2 capsules (1,334 mg total) by mouth 3 (three) times daily with meals. Patient not taking: Reported on 06/13/2019 01/05/19   Raiford Noble Latif, DO  multivitamin (RENA-VIT) TABS tablet Take 1 tablet by mouth at bedtime. Patient not taking: Reported on 06/13/2019 01/05/19   Kerney Elbe, DO    Family History Family History  Problem Relation Age of Onset  . CAD Neg Hx     Social History Social History   Tobacco Use  . Smoking status: Never Smoker  . Smokeless tobacco: Never Used  Substance Use Topics  . Alcohol use: No  . Drug use: No      Allergies   Patient has no known allergies.   Review of Systems Review of Systems All other systems reviewed and are negative except that which was mentioned in HPI   Physical Exam Updated Vital Signs BP (!) 150/68   Pulse 89   Resp 14   Ht 5\' 4"  (1.626 m)   Wt 68 kg   LMP 12/08/2013   SpO2 100%   BMI 25.75 kg/m   Physical Exam Vitals signs and nursing note reviewed.  Constitutional:      General: She is not in acute distress.    Appearance: She is well-developed.  HENT:     Head: Normocephalic and atraumatic.  Eyes:     Conjunctiva/sclera: Conjunctivae normal.  Neck:     Musculoskeletal: Neck supple.  Cardiovascular:     Rate and Rhythm: Normal rate and regular rhythm.     Heart sounds: Murmur present.  Pulmonary:     Effort: Pulmonary effort is normal.  Breath sounds: Normal breath sounds.  Abdominal:     General: Bowel sounds are normal. There is no distension.     Palpations: Abdomen is soft.     Tenderness: There is no abdominal tenderness.  Musculoskeletal:        General: Tenderness present.     Comments: Sutures in place on L medial arm with mild edema around elbow and proximal forearm, no drainage or erythema; 2+ radial pulse  Skin:    General: Skin is warm and dry.     Comments: CVC in R chest   Neurological:     Mental Status: She is alert and oriented to person, place, and time.     Comments: Fluent speech  Psychiatric:        Judgment: Judgment normal.      ED Treatments / Results  Labs (all labs ordered are listed, but only abnormal results are displayed) Labs Reviewed  CBC WITH DIFFERENTIAL/PLATELET - Abnormal; Notable for the following components:      Result Value   RBC 3.46 (*)    Hemoglobin 8.8 (*)    HCT 28.6 (*)    MCH 25.4 (*)    RDW 18.4 (*)    Monocytes Absolute 1.2 (*)    All other components within normal limits  COMPREHENSIVE METABOLIC PANEL - Abnormal; Notable for the following components:   Sodium 132 (*)     Chloride 91 (*)    Glucose, Bld 359 (*)    BUN 58 (*)    Creatinine, Ser 10.54 (*)    Calcium 8.8 (*)    Albumin 3.0 (*)    AST 11 (*)    GFR calc non Af Amer 4 (*)    GFR calc Af Amer 4 (*)    Anion gap 17 (*)    All other components within normal limits  CBG MONITORING, ED - Abnormal; Notable for the following components:   Glucose-Capillary 438 (*)    All other components within normal limits  POCT I-STAT EG7 - Abnormal; Notable for the following components:   pH, Ven 7.468 (*)    pCO2, Ven 36.0 (*)    pO2, Ven 78.0 (*)    Sodium 134 (*)    Calcium, Ion 0.97 (*)    HCT 30.0 (*)    Hemoglobin 10.2 (*)    All other components within normal limits  CBG MONITORING, ED - Abnormal; Notable for the following components:   Glucose-Capillary 246 (*)    All other components within normal limits  URINALYSIS, ROUTINE W REFLEX MICROSCOPIC    EKG None  Radiology No results found.  Procedures Procedures (including critical care time)  Medications Ordered in ED Medications  insulin detemir (LEVEMIR) injection 4 Units (4 Units Subcutaneous Given 06/15/19 0844)  sodium chloride 0.9 % bolus 500 mL (500 mLs Intravenous New Bag/Given 06/15/19 0846)     Initial Impression / Assessment and Plan / ED Course  I have reviewed the triage vital signs and the nursing notes.  Pertinent labs that were available during my care of the patient were reviewed by me and considered in my medical decision making (see chart for details).       Alert, comfortable on exam. BG 359 on CMP, gave levemir home dose 4u and small fluid bolus as no signs of volume overload and 100% on RA. Repeat BG 246.  Instructed patient to use Levemir daily in the morning instead of at night as it sounds like she does not use  this medication for fear of becoming hypoglycemic overnight.  Ultimately, I think she would benefit from an endocrinologist and I provided her with [facility] endocrinology clinic information.  She is  concerned that oxycodone has caused hyperglycemia, and instructed to use Tylenol on a schedule instead.  Contacted dialysis coordinator, Jaclyn Shaggy, who has ensured that patient can be dialyzed at her clinic today at 1230.  Return precautions reviewed.  Final Clinical Impressions(s) / ED Diagnoses   Final diagnoses:  Hyperglycemia    ED Discharge Orders    None       Little, Wenda Overland, MD 06/15/19 1028

## 2019-06-15 NOTE — ED Triage Notes (Signed)
Pt POV d/t at home CBG was > 600. Pt went to HD and CBG was >600. Pt gave herself 10 units insulin. Pt HD MWF. Pt did not receive HD

## 2019-06-15 NOTE — Discharge Instructions (Signed)
Take tylenol 1000mg  every 6 hours as needed for pain; take no more than 4 times per day. Follow up with [facility] Endocr[facility]ology for blood sugar management.

## 2019-06-15 NOTE — Progress Notes (Signed)
Renal Navigator received phone call from EDP/Dr. Rex Kras stating that patient is cleared for discharge from ED, but needs OP HD today. Renal Navigator contacted OP HD clinic/[facility] and learned that she has already missed her regularly scheduled appointment, but that they can accommodate her if she arrives by 12:30pm today. Renal Navigator spoke with patient, who was appreciative and states her boyfriend can pick her up to take her to this appointment. Renal Navigator notified Dr. Rex Kras who states she will discharge patient now.  Alphonzo Cruise, [place] Renal Navigator [phone]

## 2019-06-15 NOTE — ED Notes (Signed)
Patient verbalizes understanding of discharge instructions. Opportunity for questioning and answers were provided. Armband removed by staff, pt discharged from ED.  

## 2019-06-17 DIAGNOSIS — N186 End stage renal disease: Secondary | ICD-10-CM | POA: Diagnosis not present

## 2019-06-17 DIAGNOSIS — N2581 Secondary hyperparathyroidism of renal origin: Secondary | ICD-10-CM | POA: Diagnosis not present

## 2019-06-20 DIAGNOSIS — N186 End stage renal disease: Secondary | ICD-10-CM | POA: Diagnosis not present

## 2019-06-20 DIAGNOSIS — N2581 Secondary hyperparathyroidism of renal origin: Secondary | ICD-10-CM | POA: Diagnosis not present

## 2019-06-22 DIAGNOSIS — N2581 Secondary hyperparathyroidism of renal origin: Secondary | ICD-10-CM | POA: Diagnosis not present

## 2019-06-22 DIAGNOSIS — N186 End stage renal disease: Secondary | ICD-10-CM | POA: Diagnosis not present

## 2019-06-24 DIAGNOSIS — N2581 Secondary hyperparathyroidism of renal origin: Secondary | ICD-10-CM | POA: Diagnosis not present

## 2019-06-24 DIAGNOSIS — N186 End stage renal disease: Secondary | ICD-10-CM | POA: Diagnosis not present

## 2019-06-24 DIAGNOSIS — Z1151 Encounter for screening for human papillomavirus (HPV): Secondary | ICD-10-CM | POA: Diagnosis not present

## 2019-06-24 DIAGNOSIS — Z124 Encounter for screening for malignant neoplasm of cervix: Secondary | ICD-10-CM | POA: Diagnosis not present

## 2019-06-24 DIAGNOSIS — Z01419 Encounter for gynecological examination (general) (routine) without abnormal findings: Secondary | ICD-10-CM | POA: Diagnosis not present

## 2019-06-27 DIAGNOSIS — E1122 Type 2 diabetes mellitus with diabetic chronic kidney disease: Secondary | ICD-10-CM | POA: Diagnosis not present

## 2019-06-27 DIAGNOSIS — E785 Hyperlipidemia, unspecified: Secondary | ICD-10-CM | POA: Diagnosis not present

## 2019-06-27 DIAGNOSIS — N186 End stage renal disease: Secondary | ICD-10-CM | POA: Diagnosis not present

## 2019-06-27 DIAGNOSIS — Z79899 Other long term (current) drug therapy: Secondary | ICD-10-CM | POA: Diagnosis not present

## 2019-06-27 DIAGNOSIS — N2581 Secondary hyperparathyroidism of renal origin: Secondary | ICD-10-CM | POA: Diagnosis not present

## 2019-06-29 DIAGNOSIS — N186 End stage renal disease: Secondary | ICD-10-CM | POA: Diagnosis not present

## 2019-06-29 DIAGNOSIS — N2581 Secondary hyperparathyroidism of renal origin: Secondary | ICD-10-CM | POA: Diagnosis not present

## 2019-06-30 DIAGNOSIS — N186 End stage renal disease: Secondary | ICD-10-CM | POA: Diagnosis not present

## 2019-06-30 DIAGNOSIS — E11319 Type 2 diabetes mellitus with unspecified diabetic retinopathy without macular edema: Secondary | ICD-10-CM | POA: Diagnosis not present

## 2019-06-30 DIAGNOSIS — E1122 Type 2 diabetes mellitus with diabetic chronic kidney disease: Secondary | ICD-10-CM | POA: Diagnosis not present

## 2019-06-30 DIAGNOSIS — Z0000 Encounter for general adult medical examination without abnormal findings: Secondary | ICD-10-CM | POA: Diagnosis not present

## 2019-07-01 DIAGNOSIS — N2581 Secondary hyperparathyroidism of renal origin: Secondary | ICD-10-CM | POA: Diagnosis not present

## 2019-07-01 DIAGNOSIS — N186 End stage renal disease: Secondary | ICD-10-CM | POA: Diagnosis not present

## 2019-07-04 DIAGNOSIS — N2581 Secondary hyperparathyroidism of renal origin: Secondary | ICD-10-CM | POA: Diagnosis not present

## 2019-07-04 DIAGNOSIS — N186 End stage renal disease: Secondary | ICD-10-CM | POA: Diagnosis not present

## 2019-07-06 DIAGNOSIS — N2581 Secondary hyperparathyroidism of renal origin: Secondary | ICD-10-CM | POA: Diagnosis not present

## 2019-07-06 DIAGNOSIS — N186 End stage renal disease: Secondary | ICD-10-CM | POA: Diagnosis not present

## 2019-07-08 DIAGNOSIS — E1122 Type 2 diabetes mellitus with diabetic chronic kidney disease: Secondary | ICD-10-CM | POA: Diagnosis not present

## 2019-07-08 DIAGNOSIS — N2581 Secondary hyperparathyroidism of renal origin: Secondary | ICD-10-CM | POA: Diagnosis not present

## 2019-07-08 DIAGNOSIS — Z992 Dependence on renal dialysis: Secondary | ICD-10-CM | POA: Diagnosis not present

## 2019-07-08 DIAGNOSIS — N186 End stage renal disease: Secondary | ICD-10-CM | POA: Diagnosis not present

## 2019-07-11 DIAGNOSIS — N186 End stage renal disease: Secondary | ICD-10-CM | POA: Diagnosis not present

## 2019-07-11 DIAGNOSIS — Z992 Dependence on renal dialysis: Secondary | ICD-10-CM | POA: Diagnosis not present

## 2019-07-11 DIAGNOSIS — N2581 Secondary hyperparathyroidism of renal origin: Secondary | ICD-10-CM | POA: Diagnosis not present

## 2019-07-13 DIAGNOSIS — N2581 Secondary hyperparathyroidism of renal origin: Secondary | ICD-10-CM | POA: Diagnosis not present

## 2019-07-13 DIAGNOSIS — N186 End stage renal disease: Secondary | ICD-10-CM | POA: Diagnosis not present

## 2019-07-13 DIAGNOSIS — Z992 Dependence on renal dialysis: Secondary | ICD-10-CM | POA: Diagnosis not present

## 2019-07-14 DIAGNOSIS — N186 End stage renal disease: Secondary | ICD-10-CM | POA: Diagnosis not present

## 2019-07-14 DIAGNOSIS — E1122 Type 2 diabetes mellitus with diabetic chronic kidney disease: Secondary | ICD-10-CM | POA: Diagnosis not present

## 2019-07-14 DIAGNOSIS — Z992 Dependence on renal dialysis: Secondary | ICD-10-CM | POA: Diagnosis not present

## 2019-07-15 DIAGNOSIS — N186 End stage renal disease: Secondary | ICD-10-CM | POA: Diagnosis not present

## 2019-07-15 DIAGNOSIS — Z992 Dependence on renal dialysis: Secondary | ICD-10-CM | POA: Diagnosis not present

## 2019-07-15 DIAGNOSIS — N2581 Secondary hyperparathyroidism of renal origin: Secondary | ICD-10-CM | POA: Diagnosis not present

## 2019-07-18 DIAGNOSIS — N186 End stage renal disease: Secondary | ICD-10-CM | POA: Diagnosis not present

## 2019-07-18 DIAGNOSIS — N2581 Secondary hyperparathyroidism of renal origin: Secondary | ICD-10-CM | POA: Diagnosis not present

## 2019-07-18 DIAGNOSIS — Z992 Dependence on renal dialysis: Secondary | ICD-10-CM | POA: Diagnosis not present

## 2019-07-20 DIAGNOSIS — N2581 Secondary hyperparathyroidism of renal origin: Secondary | ICD-10-CM | POA: Diagnosis not present

## 2019-07-20 DIAGNOSIS — N186 End stage renal disease: Secondary | ICD-10-CM | POA: Diagnosis not present

## 2019-07-20 DIAGNOSIS — Z992 Dependence on renal dialysis: Secondary | ICD-10-CM | POA: Diagnosis not present

## 2019-07-21 ENCOUNTER — Ambulatory Visit (INDEPENDENT_AMBULATORY_CARE_PROVIDER_SITE_OTHER): Payer: Self-pay | Admitting: Vascular Surgery

## 2019-07-21 ENCOUNTER — Encounter: Payer: Self-pay | Admitting: *Deleted

## 2019-07-21 ENCOUNTER — Other Ambulatory Visit: Payer: Self-pay

## 2019-07-21 ENCOUNTER — Encounter: Payer: Self-pay | Admitting: Vascular Surgery

## 2019-07-21 ENCOUNTER — Other Ambulatory Visit: Payer: Self-pay | Admitting: *Deleted

## 2019-07-21 ENCOUNTER — Ambulatory Visit (HOSPITAL_COMMUNITY)
Admission: RE | Admit: 2019-07-21 | Discharge: 2019-07-21 | Disposition: A | Discharge status: Home / Self Care | Payer: Federal, State, Local not specified - PPO | Source: Ambulatory Visit | Attending: Vascular Surgery | Admitting: Vascular Surgery

## 2019-07-21 VITALS — BP 144/78 | HR 88 | Temp 97.9°F | Resp 18 | Ht 64.0 in | Wt 153.0 lb

## 2019-07-21 DIAGNOSIS — Z992 Dependence on renal dialysis: Secondary | ICD-10-CM

## 2019-07-21 DIAGNOSIS — N186 End stage renal disease: Secondary | ICD-10-CM | POA: Insufficient documentation

## 2019-07-21 NOTE — Progress Notes (Signed)
Spoke with [employer] at [facility]. Requested schedule change for HD to accommodate procedure on 07/10/2019. They will arrange with patient . Patient will speak with staff at [facility] for changed HD time.

## 2019-07-21 NOTE — H&P (View-Only) (Signed)
Patient is a 58 year old female who returns for follow-up today.  She underwent placement of a basilic fistula January 4967.  She subsequently had a second stage procedure June 14, 2019.  She has no numbness or tingling in her hand.  Her dialysis today is Monday Wednesday Friday.  Physical exam:  Vitals:   07/21/19 0822  BP: (!) 144/78  Pulse: 88  Resp: 18  Temp: 97.9 F (36.6 C)  SpO2: 99%  Weight: 153 lb (69.4 kg)  Height: 5\' 4"  (1.626 m)    Left upper extremity: Healing incisions still some residual glue, palpable thrill in fistula 1+ left radial pulse  Data: Patient had duplex ultrasound of her AV fistula today it is fairly uniform diameter of about 7 mm except in the axilla where it narrows down to about 2 mm.  The fistula is less than 6 mm from the skin surface.  Assessment: Maturing AV fistula left arm.  Potential narrowing in the axillary region.  Plan: Patient be scheduled for fistulogram possible intervention on July 29, 2019.  We will need to switch her hemodialysis today.  She preferred to do this on a Friday which accommodated my schedule rather than switch physicians.  Risk benefits possible complications and procedure details were discussed with the patient today include not limited to bleeding infection vessel injury.  I also discussed with her that if it is an extensive long narrowed segment we may need to patch this in the operating room on a different day.  [name], MD [facility] Office: [phone] Pager: [phone]

## 2019-07-21 NOTE — Progress Notes (Signed)
Patient is a 58 year old female who returns for follow-up today.  She underwent placement of a basilic fistula January 8466.  She subsequently had a second stage procedure June 14, 2019.  She has no numbness or tingling in her hand.  Her dialysis today is Monday Wednesday Friday.  Physical exam:  Vitals:   07/21/19 0822  BP: (!) 144/78  Pulse: 88  Resp: 18  Temp: 97.9 F (36.6 C)  SpO2: 99%  Weight: 153 lb (69.4 kg)  Height: 5\' 4"  (1.626 m)    Left upper extremity: Healing incisions still some residual glue, palpable thrill in fistula 1+ left radial pulse  Data: Patient had duplex ultrasound of her AV fistula today it is fairly uniform diameter of about 7 mm except in the axilla where it narrows down to about 2 mm.  The fistula is less than 6 mm from the skin surface.  Assessment: Maturing AV fistula left arm.  Potential narrowing in the axillary region.  Plan: Patient be scheduled for fistulogram possible intervention on July 29, 2019.  We will need to switch her hemodialysis today.  She preferred to do this on a Friday which accommodated my schedule rather than switch physicians.  Risk benefits possible complications and procedure details were discussed with the patient today include not limited to bleeding infection vessel injury.  I also discussed with her that if it is an extensive long narrowed segment we may need to patch this in the operating room on a different day.  [name], MD [facility] [place] Office: [phone] Pager: [phone]

## 2019-07-22 DIAGNOSIS — N186 End stage renal disease: Secondary | ICD-10-CM | POA: Diagnosis not present

## 2019-07-22 DIAGNOSIS — N2581 Secondary hyperparathyroidism of renal origin: Secondary | ICD-10-CM | POA: Diagnosis not present

## 2019-07-22 DIAGNOSIS — Z992 Dependence on renal dialysis: Secondary | ICD-10-CM | POA: Diagnosis not present

## 2019-07-23 DIAGNOSIS — Z992 Dependence on renal dialysis: Secondary | ICD-10-CM | POA: Diagnosis not present

## 2019-07-23 DIAGNOSIS — N186 End stage renal disease: Secondary | ICD-10-CM | POA: Diagnosis not present

## 2019-07-23 DIAGNOSIS — N2581 Secondary hyperparathyroidism of renal origin: Secondary | ICD-10-CM | POA: Diagnosis not present

## 2019-07-25 DIAGNOSIS — I361 Nonrheumatic tricuspid (valve) insufficiency: Secondary | ICD-10-CM | POA: Diagnosis not present

## 2019-07-25 DIAGNOSIS — I70203 Unspecified atherosclerosis of native arteries of extremities, bilateral legs: Secondary | ICD-10-CM | POA: Diagnosis not present

## 2019-07-25 DIAGNOSIS — I700 Atherosclerosis of aorta: Secondary | ICD-10-CM | POA: Diagnosis not present

## 2019-07-25 DIAGNOSIS — Z1239 Encounter for other screening for malignant neoplasm of breast: Secondary | ICD-10-CM | POA: Diagnosis not present

## 2019-07-25 DIAGNOSIS — E1122 Type 2 diabetes mellitus with diabetic chronic kidney disease: Secondary | ICD-10-CM | POA: Diagnosis not present

## 2019-07-25 DIAGNOSIS — N186 End stage renal disease: Secondary | ICD-10-CM | POA: Diagnosis not present

## 2019-07-25 DIAGNOSIS — Z5181 Encounter for therapeutic drug level monitoring: Secondary | ICD-10-CM | POA: Diagnosis not present

## 2019-07-25 DIAGNOSIS — I071 Rheumatic tricuspid insufficiency: Secondary | ICD-10-CM | POA: Diagnosis not present

## 2019-07-25 DIAGNOSIS — Z86718 Personal history of other venous thrombosis and embolism: Secondary | ICD-10-CM | POA: Diagnosis not present

## 2019-07-25 DIAGNOSIS — Z01810 Encounter for preprocedural cardiovascular examination: Secondary | ICD-10-CM | POA: Diagnosis not present

## 2019-07-25 DIAGNOSIS — I708 Atherosclerosis of other arteries: Secondary | ICD-10-CM | POA: Diagnosis not present

## 2019-07-25 DIAGNOSIS — Z9229 Personal history of other drug therapy: Secondary | ICD-10-CM | POA: Diagnosis not present

## 2019-07-25 DIAGNOSIS — Z992 Dependence on renal dialysis: Secondary | ICD-10-CM | POA: Diagnosis not present

## 2019-07-25 DIAGNOSIS — Z79899 Other long term (current) drug therapy: Secondary | ICD-10-CM | POA: Diagnosis not present

## 2019-07-25 DIAGNOSIS — Z1231 Encounter for screening mammogram for malignant neoplasm of breast: Secondary | ICD-10-CM | POA: Diagnosis not present

## 2019-07-25 DIAGNOSIS — I517 Cardiomegaly: Secondary | ICD-10-CM | POA: Diagnosis not present

## 2019-07-25 DIAGNOSIS — Z7682 Awaiting organ transplant status: Secondary | ICD-10-CM | POA: Diagnosis not present

## 2019-07-25 DIAGNOSIS — Z86711 Personal history of pulmonary embolism: Secondary | ICD-10-CM | POA: Diagnosis not present

## 2019-07-26 ENCOUNTER — Other Ambulatory Visit (HOSPITAL_COMMUNITY)
Admission: RE | Admit: 2019-07-26 | Discharge: 2019-07-26 | Disposition: A | Discharge status: Home / Self Care | Payer: Federal, State, Local not specified - PPO | Source: Ambulatory Visit | Attending: Vascular Surgery | Admitting: Vascular Surgery

## 2019-07-26 DIAGNOSIS — Z992 Dependence on renal dialysis: Secondary | ICD-10-CM | POA: Diagnosis not present

## 2019-07-26 DIAGNOSIS — Z01812 Encounter for preprocedural laboratory examination: Secondary | ICD-10-CM | POA: Diagnosis not present

## 2019-07-26 DIAGNOSIS — Z20828 Contact with and (suspected) exposure to other viral communicable diseases: Secondary | ICD-10-CM | POA: Insufficient documentation

## 2019-07-26 DIAGNOSIS — N186 End stage renal disease: Secondary | ICD-10-CM | POA: Diagnosis not present

## 2019-07-26 DIAGNOSIS — N2581 Secondary hyperparathyroidism of renal origin: Secondary | ICD-10-CM | POA: Diagnosis not present

## 2019-07-26 LAB — SARS CORONAVIRUS 2 (TAT 6-24 HRS): SARS Coronavirus 2: NEGATIVE

## 2019-07-27 DIAGNOSIS — E113553 Type 2 diabetes mellitus with stable proliferative diabetic retinopathy, bilateral: Secondary | ICD-10-CM | POA: Diagnosis not present

## 2019-07-27 DIAGNOSIS — E1122 Type 2 diabetes mellitus with diabetic chronic kidney disease: Secondary | ICD-10-CM | POA: Diagnosis not present

## 2019-07-27 DIAGNOSIS — H35372 Puckering of macula, left eye: Secondary | ICD-10-CM | POA: Diagnosis not present

## 2019-07-27 DIAGNOSIS — N186 End stage renal disease: Secondary | ICD-10-CM | POA: Diagnosis not present

## 2019-07-27 DIAGNOSIS — H4312 Vitreous hemorrhage, left eye: Secondary | ICD-10-CM | POA: Diagnosis not present

## 2019-07-27 DIAGNOSIS — Z992 Dependence on renal dialysis: Secondary | ICD-10-CM | POA: Diagnosis not present

## 2019-07-27 DIAGNOSIS — H2513 Age-related nuclear cataract, bilateral: Secondary | ICD-10-CM | POA: Diagnosis not present

## 2019-07-27 DIAGNOSIS — N2581 Secondary hyperparathyroidism of renal origin: Secondary | ICD-10-CM | POA: Diagnosis not present

## 2019-07-29 ENCOUNTER — Ambulatory Visit (HOSPITAL_COMMUNITY)
Admission: RE | Admit: 2019-07-29 | Discharge: 2019-07-29 | Disposition: A | Discharge status: Home / Self Care | Payer: Federal, State, Local not specified - PPO | Attending: Vascular Surgery | Admitting: Vascular Surgery

## 2019-07-29 ENCOUNTER — Other Ambulatory Visit: Payer: Self-pay

## 2019-07-29 ENCOUNTER — Encounter (HOSPITAL_COMMUNITY)
Admission: RE | Disposition: A | Discharge status: Home / Self Care | Payer: Self-pay | Source: Home / Self Care | Attending: Vascular Surgery

## 2019-07-29 DIAGNOSIS — Z992 Dependence on renal dialysis: Secondary | ICD-10-CM | POA: Diagnosis not present

## 2019-07-29 DIAGNOSIS — Y841 Kidney dialysis as the cause of abnormal reaction of the patient, or of later complication, without mention of misadventure at the time of the procedure: Secondary | ICD-10-CM | POA: Diagnosis not present

## 2019-07-29 DIAGNOSIS — N186 End stage renal disease: Secondary | ICD-10-CM | POA: Diagnosis not present

## 2019-07-29 DIAGNOSIS — T82858A Stenosis of vascular prosthetic devices, implants and grafts, initial encounter: Secondary | ICD-10-CM | POA: Insufficient documentation

## 2019-07-29 HISTORY — PX: A/V FISTULAGRAM: CATH118298

## 2019-07-29 HISTORY — PX: PERIPHERAL VASCULAR BALLOON ANGIOPLASTY: CATH118281

## 2019-07-29 LAB — POCT I-STAT, CHEM 8
BUN: 54 mg/dL — ABNORMAL HIGH (ref 6–20)
Calcium, Ion: 1.13 mmol/L — ABNORMAL LOW (ref 1.15–1.40)
Chloride: 98 mmol/L (ref 98–111)
Creatinine, Ser: 10.5 mg/dL — ABNORMAL HIGH (ref 0.44–1.00)
Glucose, Bld: 128 mg/dL — ABNORMAL HIGH (ref 70–99)
HCT: 41 % (ref 36.0–46.0)
Hemoglobin: 13.9 g/dL (ref 12.0–15.0)
Potassium: 4.6 mmol/L (ref 3.5–5.1)
Sodium: 135 mmol/L (ref 135–145)
TCO2: 26 mmol/L (ref 22–32)

## 2019-07-29 SURGERY — A/V FISTULAGRAM
Anesthesia: LOCAL | Laterality: Left

## 2019-07-29 MED ORDER — LIDOCAINE HCL (PF) 1 % IJ SOLN
INTRAMUSCULAR | Status: DC | PRN
  Administered 2019-07-29: 2 mL

## 2019-07-29 MED ORDER — SODIUM CHLORIDE 0.9% FLUSH: 3.0000 mL | Freq: Two times a day (BID) | INTRAVENOUS | Status: DC

## 2019-07-29 MED ORDER — LIDOCAINE HCL (PF) 1 % IJ SOLN
INTRAMUSCULAR | Status: AC
  Filled 2019-07-29: qty 30

## 2019-07-29 MED ORDER — SODIUM CHLORIDE 0.9 % IV SOLN: 250.0000 mL | INTRAVENOUS | Status: DC | PRN

## 2019-07-29 MED ORDER — HEPARIN (PORCINE) IN NACL 1000-0.9 UT/500ML-% IV SOLN
INTRAVENOUS | Status: AC
  Filled 2019-07-29: qty 500

## 2019-07-29 MED ORDER — SODIUM CHLORIDE 0.9% FLUSH: 3.0000 mL | INTRAVENOUS | Status: DC | PRN

## 2019-07-29 MED ORDER — HEPARIN SODIUM (PORCINE) 1000 UNIT/ML IJ SOLN
INTRAMUSCULAR | Status: DC | PRN
  Administered 2019-07-29: 3000 [IU] via INTRAVENOUS

## 2019-07-29 MED ORDER — IODIXANOL 320 MG/ML IV SOLN
INTRAVENOUS | Status: DC | PRN
  Administered 2019-07-29: 70 mL via INTRAVENOUS

## 2019-07-29 MED ORDER — HEPARIN (PORCINE) IN NACL 1000-0.9 UT/500ML-% IV SOLN
INTRAVENOUS | Status: DC | PRN
  Administered 2019-07-29: 500 mL

## 2019-07-29 SURGICAL SUPPLY — 18 items
BAG SNAP BAND KOVER 36X36 (MISCELLANEOUS) ×2 IMPLANT
BALLN MUSTANG 10.0X40 75 (BALLOONS) ×2
BALLN MUSTANG 6.0X40 75 (BALLOONS) ×2
BALLN MUSTANG 8.0X40 75 (BALLOONS) ×2
BALLOON MUSTANG 10.0X40 75 (BALLOONS) ×1 IMPLANT
BALLOON MUSTANG 6.0X40 75 (BALLOONS) ×1 IMPLANT
BALLOON MUSTANG 8.0X40 75 (BALLOONS) ×1 IMPLANT
COVER DOME SNAP 22 D (MISCELLANEOUS) ×2 IMPLANT
KIT ENCORE 26 ADVANTAGE (KITS) ×2 IMPLANT
KIT MICROPUNCTURE NIT STIFF (SHEATH) ×2 IMPLANT
PROTECTION STATION PRESSURIZED (MISCELLANEOUS) ×2
SHEATH PINNACLE R/O II 6F 4CM (SHEATH) ×2 IMPLANT
SHEATH PROBE COVER 6X72 (BAG) ×2 IMPLANT
STATION PROTECTION PRESSURIZED (MISCELLANEOUS) ×1 IMPLANT
STOPCOCK MORSE 400PSI 3WAY (MISCELLANEOUS) ×2 IMPLANT
TRAY PV CATH (CUSTOM PROCEDURE TRAY) ×2 IMPLANT
TUBING CIL FLEX 10 FLL-RA (TUBING) ×2 IMPLANT
WIRE BENTSON .035X145CM (WIRE) ×2 IMPLANT

## 2019-07-29 NOTE — Op Note (Signed)
Procedure: Left arm fistulogram, ultrasound left arm  Preoperative diagnosis: Vein stenosis left arm fistula  Most operative diagnosis: Same  Anesthesia: Local  Operative findings: 70% stenosis left axillary vein angioplastied to less than 20% residual stenosis most likely retained valve  Operative details: After obtaining form consent, patient taken the PV lab.  The patient placed in supine position Angie table.  Left upper extremities prepped and draped in usual sterile fashion.  Local anesthesia was infiltrated over the fistula just above the antecubital crease.  Ultrasound was used to identify the fistula and this was cannulated with a micropuncture needle.  Image of the ultrasound was obtained for the patient's record.  Micropuncture sheath was placed over the guidewire up into the AV fistula.  Contrast angiogram was then obtained of the central veins and inflow.  Central veins are widely patent.  The subclavian and axillary vein is patent.  There is a narrowing at the basilic axillary vein junction in the axilla.  This is about 70%.  With compression of the upper portion of the fistula reflux of contrast across the arterial anastomosis showed no significant narrowing.  At this point an [place] wire was placed through the sheath up into the central circulation.  The micropuncture sheath was swapped out for a 6 [place] short sheath.  The patient was given 3000 units of intravenous heparin.  A 6 x 40 angioplasty balloon was brought up and centered on the area of narrowing in the upper arm.  This was then inflated to nominal pressure for 1 minute.  There was minimal waist on the balloon.  This was removed and replaced with an 8 x 40 balloon.  Again there was minimal waist when this was inflated to nominal pressure.  We then went to a 1040 balloon.  There was a significant waist on this but the valve area was very resistant to angioplasty.  Despite multiple angioplasties in this area to 14 mill  atmospheres there was still a slight waist of about 20%.  I did not feel I could make this any larger without risking rupturing the fistula.  Patient also at this size should have at least an 8 mm lumen through the area.  At this point a guidewire was removed.  A figure-of-eight Monocryl stitch was placed around the sheath and sheath removed and hemostasis obtained with direct pressure.  The patient tired procedure well and there were no complications.  The patient taken the holding in stable condition.  Operative management: Patient's fistula is ready for cannulation early next week.  She can certainly follow-up with [name] in the future if she has any significant narrowing recurrence.  Would be amenable to percutaneous intervention however if this is an early on recurrence she may be better off with an operative patch angioplasty.  [name], MD [facility] [place] Office: [phone] Pager: [phone]

## 2019-07-29 NOTE — Interval H&P Note (Signed)
History and Physical Interval Note:  07/29/2019 12:50 PM  Colleen [name]  has presented today for surgery, with the diagnosis of complication of fistula.  The various methods of treatment have been discussed with the patient and family. After consideration of risks, benefits and other options for treatment, the patient has consented to  Procedure(s): A/V FISTULAGRAM (Left) as a surgical intervention.  The patient's history has been reviewed, patient examined, no change in status, stable for surgery.  I have reviewed the patient's chart and labs.  Questions were answered to the patient's satisfaction.     [name]

## 2019-07-29 NOTE — Discharge Instructions (Signed)

## 2019-07-30 DIAGNOSIS — N2581 Secondary hyperparathyroidism of renal origin: Secondary | ICD-10-CM | POA: Diagnosis not present

## 2019-07-30 DIAGNOSIS — N186 End stage renal disease: Secondary | ICD-10-CM | POA: Diagnosis not present

## 2019-07-30 DIAGNOSIS — Z992 Dependence on renal dialysis: Secondary | ICD-10-CM | POA: Diagnosis not present

## 2019-08-01 ENCOUNTER — Telehealth: Payer: Self-pay | Admitting: *Deleted

## 2019-08-01 ENCOUNTER — Encounter (HOSPITAL_COMMUNITY): Payer: Self-pay | Admitting: Vascular Surgery

## 2019-08-01 DIAGNOSIS — N2581 Secondary hyperparathyroidism of renal origin: Secondary | ICD-10-CM | POA: Diagnosis not present

## 2019-08-01 DIAGNOSIS — N186 End stage renal disease: Secondary | ICD-10-CM | POA: Diagnosis not present

## 2019-08-01 DIAGNOSIS — Z992 Dependence on renal dialysis: Secondary | ICD-10-CM | POA: Diagnosis not present

## 2019-08-01 NOTE — Telephone Encounter (Signed)
-----   Message from Elam Dutch, MD sent at 07/29/2019  2:01 PM EDT ----- Procedure: Left arm fistulogram, ultrasound left arm   Operative management: Patient's fistula is ready for cannulation early next week.  She can certainly follow-up with [name] in the future if she has any significant narrowing recurrence.  Would be amenable to percutaneous intervention however if this is an early on recurrence she may be better off with an operative patch angioplasty.  [name], MD [facility] [place] Office: [phone] Pager: [phone]

## 2019-08-02 DIAGNOSIS — E113592 Type 2 diabetes mellitus with proliferative diabetic retinopathy without macular edema, left eye: Secondary | ICD-10-CM | POA: Diagnosis not present

## 2019-08-02 DIAGNOSIS — E113511 Type 2 diabetes mellitus with proliferative diabetic retinopathy with macular edema, right eye: Secondary | ICD-10-CM | POA: Diagnosis not present

## 2019-08-02 DIAGNOSIS — H35372 Puckering of macula, left eye: Secondary | ICD-10-CM | POA: Diagnosis not present

## 2019-08-02 DIAGNOSIS — T85398D Other mechanical complication of other ocular prosthetic devices, implants and grafts, subsequent encounter: Secondary | ICD-10-CM | POA: Diagnosis not present

## 2019-08-03 DIAGNOSIS — Z992 Dependence on renal dialysis: Secondary | ICD-10-CM | POA: Diagnosis not present

## 2019-08-03 DIAGNOSIS — N2581 Secondary hyperparathyroidism of renal origin: Secondary | ICD-10-CM | POA: Diagnosis not present

## 2019-08-03 DIAGNOSIS — N186 End stage renal disease: Secondary | ICD-10-CM | POA: Diagnosis not present

## 2019-08-05 DIAGNOSIS — N186 End stage renal disease: Secondary | ICD-10-CM | POA: Diagnosis not present

## 2019-08-05 DIAGNOSIS — Z992 Dependence on renal dialysis: Secondary | ICD-10-CM | POA: Diagnosis not present

## 2019-08-05 DIAGNOSIS — T8249XA Other complication of vascular dialysis catheter, initial encounter: Secondary | ICD-10-CM | POA: Diagnosis not present

## 2019-08-05 DIAGNOSIS — N2581 Secondary hyperparathyroidism of renal origin: Secondary | ICD-10-CM | POA: Diagnosis not present

## 2019-08-08 DIAGNOSIS — N2581 Secondary hyperparathyroidism of renal origin: Secondary | ICD-10-CM | POA: Diagnosis not present

## 2019-08-08 DIAGNOSIS — N186 End stage renal disease: Secondary | ICD-10-CM | POA: Diagnosis not present

## 2019-08-08 DIAGNOSIS — E1122 Type 2 diabetes mellitus with diabetic chronic kidney disease: Secondary | ICD-10-CM | POA: Diagnosis not present

## 2019-08-08 DIAGNOSIS — Z992 Dependence on renal dialysis: Secondary | ICD-10-CM | POA: Diagnosis not present

## 2019-08-10 DIAGNOSIS — N2581 Secondary hyperparathyroidism of renal origin: Secondary | ICD-10-CM | POA: Diagnosis not present

## 2019-08-10 DIAGNOSIS — N186 End stage renal disease: Secondary | ICD-10-CM | POA: Diagnosis not present

## 2019-08-10 DIAGNOSIS — Z992 Dependence on renal dialysis: Secondary | ICD-10-CM | POA: Diagnosis not present

## 2019-08-12 DIAGNOSIS — N186 End stage renal disease: Secondary | ICD-10-CM | POA: Diagnosis not present

## 2019-08-12 DIAGNOSIS — Z992 Dependence on renal dialysis: Secondary | ICD-10-CM | POA: Diagnosis not present

## 2019-08-12 DIAGNOSIS — N2581 Secondary hyperparathyroidism of renal origin: Secondary | ICD-10-CM | POA: Diagnosis not present

## 2019-08-15 DIAGNOSIS — N186 End stage renal disease: Secondary | ICD-10-CM | POA: Diagnosis not present

## 2019-08-15 DIAGNOSIS — Z992 Dependence on renal dialysis: Secondary | ICD-10-CM | POA: Diagnosis not present

## 2019-08-15 DIAGNOSIS — N2581 Secondary hyperparathyroidism of renal origin: Secondary | ICD-10-CM | POA: Diagnosis not present

## 2019-08-17 DIAGNOSIS — N186 End stage renal disease: Secondary | ICD-10-CM | POA: Diagnosis not present

## 2019-08-17 DIAGNOSIS — Z992 Dependence on renal dialysis: Secondary | ICD-10-CM | POA: Diagnosis not present

## 2019-08-17 DIAGNOSIS — N2581 Secondary hyperparathyroidism of renal origin: Secondary | ICD-10-CM | POA: Diagnosis not present

## 2019-08-18 DIAGNOSIS — H35372 Puckering of macula, left eye: Secondary | ICD-10-CM | POA: Diagnosis not present

## 2019-08-18 DIAGNOSIS — Z8669 Personal history of other diseases of the nervous system and sense organs: Secondary | ICD-10-CM | POA: Diagnosis not present

## 2019-08-18 DIAGNOSIS — E113512 Type 2 diabetes mellitus with proliferative diabetic retinopathy with macular edema, left eye: Secondary | ICD-10-CM | POA: Diagnosis not present

## 2019-08-18 DIAGNOSIS — H2512 Age-related nuclear cataract, left eye: Secondary | ICD-10-CM | POA: Diagnosis not present

## 2019-08-18 DIAGNOSIS — Z48810 Encounter for surgical aftercare following surgery on the sense organs: Secondary | ICD-10-CM | POA: Diagnosis not present

## 2019-08-19 DIAGNOSIS — N186 End stage renal disease: Secondary | ICD-10-CM | POA: Diagnosis not present

## 2019-08-19 DIAGNOSIS — Z992 Dependence on renal dialysis: Secondary | ICD-10-CM | POA: Diagnosis not present

## 2019-08-19 DIAGNOSIS — N2581 Secondary hyperparathyroidism of renal origin: Secondary | ICD-10-CM | POA: Diagnosis not present

## 2019-08-19 DIAGNOSIS — H35372 Puckering of macula, left eye: Secondary | ICD-10-CM | POA: Diagnosis not present

## 2019-08-19 DIAGNOSIS — Z452 Encounter for adjustment and management of vascular access device: Secondary | ICD-10-CM | POA: Diagnosis not present

## 2019-08-22 DIAGNOSIS — Z992 Dependence on renal dialysis: Secondary | ICD-10-CM | POA: Diagnosis not present

## 2019-08-22 DIAGNOSIS — N186 End stage renal disease: Secondary | ICD-10-CM | POA: Diagnosis not present

## 2019-08-22 DIAGNOSIS — N2581 Secondary hyperparathyroidism of renal origin: Secondary | ICD-10-CM | POA: Diagnosis not present

## 2019-08-24 DIAGNOSIS — Z992 Dependence on renal dialysis: Secondary | ICD-10-CM | POA: Diagnosis not present

## 2019-08-24 DIAGNOSIS — N186 End stage renal disease: Secondary | ICD-10-CM | POA: Diagnosis not present

## 2019-08-24 DIAGNOSIS — N2581 Secondary hyperparathyroidism of renal origin: Secondary | ICD-10-CM | POA: Diagnosis not present

## 2019-08-26 DIAGNOSIS — E113591 Type 2 diabetes mellitus with proliferative diabetic retinopathy without macular edema, right eye: Secondary | ICD-10-CM | POA: Diagnosis not present

## 2019-08-26 DIAGNOSIS — Z992 Dependence on renal dialysis: Secondary | ICD-10-CM | POA: Diagnosis not present

## 2019-08-26 DIAGNOSIS — N186 End stage renal disease: Secondary | ICD-10-CM | POA: Diagnosis not present

## 2019-08-26 DIAGNOSIS — N2581 Secondary hyperparathyroidism of renal origin: Secondary | ICD-10-CM | POA: Diagnosis not present

## 2019-08-29 DIAGNOSIS — Z992 Dependence on renal dialysis: Secondary | ICD-10-CM | POA: Diagnosis not present

## 2019-08-29 DIAGNOSIS — N2581 Secondary hyperparathyroidism of renal origin: Secondary | ICD-10-CM | POA: Diagnosis not present

## 2019-08-29 DIAGNOSIS — N186 End stage renal disease: Secondary | ICD-10-CM | POA: Diagnosis not present

## 2019-08-31 DIAGNOSIS — Z992 Dependence on renal dialysis: Secondary | ICD-10-CM | POA: Diagnosis not present

## 2019-08-31 DIAGNOSIS — N186 End stage renal disease: Secondary | ICD-10-CM | POA: Diagnosis not present

## 2019-08-31 DIAGNOSIS — N2581 Secondary hyperparathyroidism of renal origin: Secondary | ICD-10-CM | POA: Diagnosis not present

## 2019-09-02 DIAGNOSIS — N2581 Secondary hyperparathyroidism of renal origin: Secondary | ICD-10-CM | POA: Diagnosis not present

## 2019-09-02 DIAGNOSIS — N186 End stage renal disease: Secondary | ICD-10-CM | POA: Diagnosis not present

## 2019-09-02 DIAGNOSIS — Z992 Dependence on renal dialysis: Secondary | ICD-10-CM | POA: Diagnosis not present

## 2019-09-05 DIAGNOSIS — N186 End stage renal disease: Secondary | ICD-10-CM | POA: Diagnosis not present

## 2019-09-05 DIAGNOSIS — N2581 Secondary hyperparathyroidism of renal origin: Secondary | ICD-10-CM | POA: Diagnosis not present

## 2019-09-05 DIAGNOSIS — Z992 Dependence on renal dialysis: Secondary | ICD-10-CM | POA: Diagnosis not present

## 2019-09-07 DIAGNOSIS — N2581 Secondary hyperparathyroidism of renal origin: Secondary | ICD-10-CM | POA: Diagnosis not present

## 2019-09-07 DIAGNOSIS — Z992 Dependence on renal dialysis: Secondary | ICD-10-CM | POA: Diagnosis not present

## 2019-09-07 DIAGNOSIS — N186 End stage renal disease: Secondary | ICD-10-CM | POA: Diagnosis not present

## 2019-09-07 DIAGNOSIS — E1122 Type 2 diabetes mellitus with diabetic chronic kidney disease: Secondary | ICD-10-CM | POA: Diagnosis not present

## 2019-09-09 DIAGNOSIS — Z992 Dependence on renal dialysis: Secondary | ICD-10-CM | POA: Diagnosis not present

## 2019-09-09 DIAGNOSIS — N186 End stage renal disease: Secondary | ICD-10-CM | POA: Diagnosis not present

## 2019-09-09 DIAGNOSIS — N2581 Secondary hyperparathyroidism of renal origin: Secondary | ICD-10-CM | POA: Diagnosis not present

## 2019-09-12 DIAGNOSIS — N2581 Secondary hyperparathyroidism of renal origin: Secondary | ICD-10-CM | POA: Diagnosis not present

## 2019-09-12 DIAGNOSIS — N186 End stage renal disease: Secondary | ICD-10-CM | POA: Diagnosis not present

## 2019-09-12 DIAGNOSIS — Z992 Dependence on renal dialysis: Secondary | ICD-10-CM | POA: Diagnosis not present

## 2019-09-13 DIAGNOSIS — Z86718 Personal history of other venous thrombosis and embolism: Secondary | ICD-10-CM | POA: Diagnosis not present

## 2019-09-13 DIAGNOSIS — Z136 Encounter for screening for cardiovascular disorders: Secondary | ICD-10-CM | POA: Diagnosis not present

## 2019-09-14 DIAGNOSIS — N186 End stage renal disease: Secondary | ICD-10-CM | POA: Diagnosis not present

## 2019-09-14 DIAGNOSIS — N2581 Secondary hyperparathyroidism of renal origin: Secondary | ICD-10-CM | POA: Diagnosis not present

## 2019-09-14 DIAGNOSIS — Z992 Dependence on renal dialysis: Secondary | ICD-10-CM | POA: Diagnosis not present

## 2019-09-16 DIAGNOSIS — N2581 Secondary hyperparathyroidism of renal origin: Secondary | ICD-10-CM | POA: Diagnosis not present

## 2019-09-16 DIAGNOSIS — N186 End stage renal disease: Secondary | ICD-10-CM | POA: Diagnosis not present

## 2019-09-16 DIAGNOSIS — Z992 Dependence on renal dialysis: Secondary | ICD-10-CM | POA: Diagnosis not present

## 2019-09-16 DIAGNOSIS — E113592 Type 2 diabetes mellitus with proliferative diabetic retinopathy without macular edema, left eye: Secondary | ICD-10-CM | POA: Diagnosis not present

## 2019-09-16 DIAGNOSIS — H35372 Puckering of macula, left eye: Secondary | ICD-10-CM | POA: Diagnosis not present

## 2019-09-19 DIAGNOSIS — N2581 Secondary hyperparathyroidism of renal origin: Secondary | ICD-10-CM | POA: Diagnosis not present

## 2019-09-19 DIAGNOSIS — Z992 Dependence on renal dialysis: Secondary | ICD-10-CM | POA: Diagnosis not present

## 2019-09-19 DIAGNOSIS — N186 End stage renal disease: Secondary | ICD-10-CM | POA: Diagnosis not present

## 2019-09-21 DIAGNOSIS — N186 End stage renal disease: Secondary | ICD-10-CM | POA: Diagnosis not present

## 2019-09-21 DIAGNOSIS — Z992 Dependence on renal dialysis: Secondary | ICD-10-CM | POA: Diagnosis not present

## 2019-09-21 DIAGNOSIS — N2581 Secondary hyperparathyroidism of renal origin: Secondary | ICD-10-CM | POA: Diagnosis not present

## 2019-09-23 DIAGNOSIS — N2581 Secondary hyperparathyroidism of renal origin: Secondary | ICD-10-CM | POA: Diagnosis not present

## 2019-09-23 DIAGNOSIS — Z992 Dependence on renal dialysis: Secondary | ICD-10-CM | POA: Diagnosis not present

## 2019-09-23 DIAGNOSIS — N186 End stage renal disease: Secondary | ICD-10-CM | POA: Diagnosis not present

## 2019-09-26 DIAGNOSIS — Z992 Dependence on renal dialysis: Secondary | ICD-10-CM | POA: Diagnosis not present

## 2019-09-26 DIAGNOSIS — N186 End stage renal disease: Secondary | ICD-10-CM | POA: Diagnosis not present

## 2019-09-26 DIAGNOSIS — N2581 Secondary hyperparathyroidism of renal origin: Secondary | ICD-10-CM | POA: Diagnosis not present

## 2019-09-26 DIAGNOSIS — Z23 Encounter for immunization: Secondary | ICD-10-CM | POA: Diagnosis not present

## 2019-09-28 DIAGNOSIS — N2581 Secondary hyperparathyroidism of renal origin: Secondary | ICD-10-CM | POA: Diagnosis not present

## 2019-09-28 DIAGNOSIS — Z992 Dependence on renal dialysis: Secondary | ICD-10-CM | POA: Diagnosis not present

## 2019-09-28 DIAGNOSIS — N186 End stage renal disease: Secondary | ICD-10-CM | POA: Diagnosis not present

## 2019-09-28 DIAGNOSIS — Z23 Encounter for immunization: Secondary | ICD-10-CM | POA: Diagnosis not present

## 2019-09-30 DIAGNOSIS — Z992 Dependence on renal dialysis: Secondary | ICD-10-CM | POA: Diagnosis not present

## 2019-09-30 DIAGNOSIS — N2581 Secondary hyperparathyroidism of renal origin: Secondary | ICD-10-CM | POA: Diagnosis not present

## 2019-09-30 DIAGNOSIS — N186 End stage renal disease: Secondary | ICD-10-CM | POA: Diagnosis not present

## 2019-09-30 DIAGNOSIS — Z23 Encounter for immunization: Secondary | ICD-10-CM | POA: Diagnosis not present

## 2019-10-03 DIAGNOSIS — N186 End stage renal disease: Secondary | ICD-10-CM | POA: Diagnosis not present

## 2019-10-03 DIAGNOSIS — Z992 Dependence on renal dialysis: Secondary | ICD-10-CM | POA: Diagnosis not present

## 2019-10-03 DIAGNOSIS — N2581 Secondary hyperparathyroidism of renal origin: Secondary | ICD-10-CM | POA: Diagnosis not present

## 2019-10-05 DIAGNOSIS — N186 End stage renal disease: Secondary | ICD-10-CM | POA: Diagnosis not present

## 2019-10-05 DIAGNOSIS — Z992 Dependence on renal dialysis: Secondary | ICD-10-CM | POA: Diagnosis not present

## 2019-10-05 DIAGNOSIS — N2581 Secondary hyperparathyroidism of renal origin: Secondary | ICD-10-CM | POA: Diagnosis not present

## 2019-10-07 DIAGNOSIS — N2581 Secondary hyperparathyroidism of renal origin: Secondary | ICD-10-CM | POA: Diagnosis not present

## 2019-10-07 DIAGNOSIS — Z992 Dependence on renal dialysis: Secondary | ICD-10-CM | POA: Diagnosis not present

## 2019-10-07 DIAGNOSIS — N186 End stage renal disease: Secondary | ICD-10-CM | POA: Diagnosis not present

## 2019-10-08 DIAGNOSIS — E1122 Type 2 diabetes mellitus with diabetic chronic kidney disease: Secondary | ICD-10-CM | POA: Diagnosis not present

## 2019-10-08 DIAGNOSIS — N186 End stage renal disease: Secondary | ICD-10-CM | POA: Diagnosis not present

## 2019-10-08 DIAGNOSIS — Z992 Dependence on renal dialysis: Secondary | ICD-10-CM | POA: Diagnosis not present

## 2019-10-09 DIAGNOSIS — H2511 Age-related nuclear cataract, right eye: Secondary | ICD-10-CM | POA: Diagnosis not present

## 2019-10-10 DIAGNOSIS — N2581 Secondary hyperparathyroidism of renal origin: Secondary | ICD-10-CM | POA: Diagnosis not present

## 2019-10-10 DIAGNOSIS — Z992 Dependence on renal dialysis: Secondary | ICD-10-CM | POA: Diagnosis not present

## 2019-10-10 DIAGNOSIS — N186 End stage renal disease: Secondary | ICD-10-CM | POA: Diagnosis not present

## 2019-10-12 DIAGNOSIS — Z992 Dependence on renal dialysis: Secondary | ICD-10-CM | POA: Diagnosis not present

## 2019-10-12 DIAGNOSIS — N2581 Secondary hyperparathyroidism of renal origin: Secondary | ICD-10-CM | POA: Diagnosis not present

## 2019-10-12 DIAGNOSIS — N186 End stage renal disease: Secondary | ICD-10-CM | POA: Diagnosis not present

## 2019-10-14 DIAGNOSIS — N2581 Secondary hyperparathyroidism of renal origin: Secondary | ICD-10-CM | POA: Diagnosis not present

## 2019-10-14 DIAGNOSIS — Z992 Dependence on renal dialysis: Secondary | ICD-10-CM | POA: Diagnosis not present

## 2019-10-14 DIAGNOSIS — N186 End stage renal disease: Secondary | ICD-10-CM | POA: Diagnosis not present

## 2019-10-17 DIAGNOSIS — N2581 Secondary hyperparathyroidism of renal origin: Secondary | ICD-10-CM | POA: Diagnosis not present

## 2019-10-17 DIAGNOSIS — N186 End stage renal disease: Secondary | ICD-10-CM | POA: Diagnosis not present

## 2019-10-17 DIAGNOSIS — Z992 Dependence on renal dialysis: Secondary | ICD-10-CM | POA: Diagnosis not present

## 2019-10-19 DIAGNOSIS — Z992 Dependence on renal dialysis: Secondary | ICD-10-CM | POA: Diagnosis not present

## 2019-10-19 DIAGNOSIS — N186 End stage renal disease: Secondary | ICD-10-CM | POA: Diagnosis not present

## 2019-10-19 DIAGNOSIS — N2581 Secondary hyperparathyroidism of renal origin: Secondary | ICD-10-CM | POA: Diagnosis not present

## 2019-10-22 DIAGNOSIS — Z992 Dependence on renal dialysis: Secondary | ICD-10-CM | POA: Diagnosis not present

## 2019-10-22 DIAGNOSIS — N2581 Secondary hyperparathyroidism of renal origin: Secondary | ICD-10-CM | POA: Diagnosis not present

## 2019-10-22 DIAGNOSIS — N186 End stage renal disease: Secondary | ICD-10-CM | POA: Diagnosis not present

## 2019-10-24 DIAGNOSIS — Z23 Encounter for immunization: Secondary | ICD-10-CM | POA: Diagnosis not present

## 2019-10-24 DIAGNOSIS — N186 End stage renal disease: Secondary | ICD-10-CM | POA: Diagnosis not present

## 2019-10-24 DIAGNOSIS — N2581 Secondary hyperparathyroidism of renal origin: Secondary | ICD-10-CM | POA: Diagnosis not present

## 2019-10-24 DIAGNOSIS — Z992 Dependence on renal dialysis: Secondary | ICD-10-CM | POA: Diagnosis not present

## 2019-10-26 DIAGNOSIS — N2581 Secondary hyperparathyroidism of renal origin: Secondary | ICD-10-CM | POA: Diagnosis not present

## 2019-10-26 DIAGNOSIS — Z23 Encounter for immunization: Secondary | ICD-10-CM | POA: Diagnosis not present

## 2019-10-26 DIAGNOSIS — Z992 Dependence on renal dialysis: Secondary | ICD-10-CM | POA: Diagnosis not present

## 2019-10-26 DIAGNOSIS — N186 End stage renal disease: Secondary | ICD-10-CM | POA: Diagnosis not present

## 2019-10-28 DIAGNOSIS — N186 End stage renal disease: Secondary | ICD-10-CM | POA: Diagnosis not present

## 2019-10-28 DIAGNOSIS — Z992 Dependence on renal dialysis: Secondary | ICD-10-CM | POA: Diagnosis not present

## 2019-10-28 DIAGNOSIS — Z23 Encounter for immunization: Secondary | ICD-10-CM | POA: Diagnosis not present

## 2019-10-28 DIAGNOSIS — N2581 Secondary hyperparathyroidism of renal origin: Secondary | ICD-10-CM | POA: Diagnosis not present

## 2019-10-30 DIAGNOSIS — N2581 Secondary hyperparathyroidism of renal origin: Secondary | ICD-10-CM | POA: Diagnosis not present

## 2019-10-30 DIAGNOSIS — N186 End stage renal disease: Secondary | ICD-10-CM | POA: Diagnosis not present

## 2019-10-30 DIAGNOSIS — Z992 Dependence on renal dialysis: Secondary | ICD-10-CM | POA: Diagnosis not present

## 2019-11-01 DIAGNOSIS — Z992 Dependence on renal dialysis: Secondary | ICD-10-CM | POA: Diagnosis not present

## 2019-11-01 DIAGNOSIS — N2581 Secondary hyperparathyroidism of renal origin: Secondary | ICD-10-CM | POA: Diagnosis not present

## 2019-11-01 DIAGNOSIS — N186 End stage renal disease: Secondary | ICD-10-CM | POA: Diagnosis not present

## 2019-11-04 DIAGNOSIS — N2581 Secondary hyperparathyroidism of renal origin: Secondary | ICD-10-CM | POA: Diagnosis not present

## 2019-11-04 DIAGNOSIS — N186 End stage renal disease: Secondary | ICD-10-CM | POA: Diagnosis not present

## 2019-11-04 DIAGNOSIS — Z992 Dependence on renal dialysis: Secondary | ICD-10-CM | POA: Diagnosis not present

## 2019-11-07 DIAGNOSIS — N186 End stage renal disease: Secondary | ICD-10-CM | POA: Diagnosis not present

## 2019-11-07 DIAGNOSIS — N2581 Secondary hyperparathyroidism of renal origin: Secondary | ICD-10-CM | POA: Diagnosis not present

## 2019-11-07 DIAGNOSIS — E1122 Type 2 diabetes mellitus with diabetic chronic kidney disease: Secondary | ICD-10-CM | POA: Diagnosis not present

## 2019-11-07 DIAGNOSIS — Z992 Dependence on renal dialysis: Secondary | ICD-10-CM | POA: Diagnosis not present

## 2019-11-09 DIAGNOSIS — Z992 Dependence on renal dialysis: Secondary | ICD-10-CM | POA: Diagnosis not present

## 2019-11-09 DIAGNOSIS — N186 End stage renal disease: Secondary | ICD-10-CM | POA: Diagnosis not present

## 2019-11-09 DIAGNOSIS — N2581 Secondary hyperparathyroidism of renal origin: Secondary | ICD-10-CM | POA: Diagnosis not present

## 2019-11-11 DIAGNOSIS — Z992 Dependence on renal dialysis: Secondary | ICD-10-CM | POA: Diagnosis not present

## 2019-11-11 DIAGNOSIS — N186 End stage renal disease: Secondary | ICD-10-CM | POA: Diagnosis not present

## 2019-11-11 DIAGNOSIS — N2581 Secondary hyperparathyroidism of renal origin: Secondary | ICD-10-CM | POA: Diagnosis not present

## 2019-11-14 DIAGNOSIS — N186 End stage renal disease: Secondary | ICD-10-CM | POA: Diagnosis not present

## 2019-11-14 DIAGNOSIS — N2581 Secondary hyperparathyroidism of renal origin: Secondary | ICD-10-CM | POA: Diagnosis not present

## 2019-11-14 DIAGNOSIS — Z992 Dependence on renal dialysis: Secondary | ICD-10-CM | POA: Diagnosis not present

## 2019-11-16 DIAGNOSIS — Z992 Dependence on renal dialysis: Secondary | ICD-10-CM | POA: Diagnosis not present

## 2019-11-16 DIAGNOSIS — N186 End stage renal disease: Secondary | ICD-10-CM | POA: Diagnosis not present

## 2019-11-16 DIAGNOSIS — N2581 Secondary hyperparathyroidism of renal origin: Secondary | ICD-10-CM | POA: Diagnosis not present

## 2019-11-17 DIAGNOSIS — H25811 Combined forms of age-related cataract, right eye: Secondary | ICD-10-CM | POA: Diagnosis not present

## 2019-11-17 DIAGNOSIS — H268 Other specified cataract: Secondary | ICD-10-CM | POA: Diagnosis not present

## 2019-11-21 DIAGNOSIS — N186 End stage renal disease: Secondary | ICD-10-CM | POA: Diagnosis not present

## 2019-11-21 DIAGNOSIS — Z23 Encounter for immunization: Secondary | ICD-10-CM | POA: Diagnosis not present

## 2019-11-21 DIAGNOSIS — Z992 Dependence on renal dialysis: Secondary | ICD-10-CM | POA: Diagnosis not present

## 2019-11-21 DIAGNOSIS — N2581 Secondary hyperparathyroidism of renal origin: Secondary | ICD-10-CM | POA: Diagnosis not present

## 2019-11-23 DIAGNOSIS — Z992 Dependence on renal dialysis: Secondary | ICD-10-CM | POA: Diagnosis not present

## 2019-11-23 DIAGNOSIS — Z23 Encounter for immunization: Secondary | ICD-10-CM | POA: Diagnosis not present

## 2019-11-23 DIAGNOSIS — N2581 Secondary hyperparathyroidism of renal origin: Secondary | ICD-10-CM | POA: Diagnosis not present

## 2019-11-23 DIAGNOSIS — N186 End stage renal disease: Secondary | ICD-10-CM | POA: Diagnosis not present

## 2019-11-25 DIAGNOSIS — Z992 Dependence on renal dialysis: Secondary | ICD-10-CM | POA: Diagnosis not present

## 2019-11-25 DIAGNOSIS — N2581 Secondary hyperparathyroidism of renal origin: Secondary | ICD-10-CM | POA: Diagnosis not present

## 2019-11-25 DIAGNOSIS — Z23 Encounter for immunization: Secondary | ICD-10-CM | POA: Diagnosis not present

## 2019-11-25 DIAGNOSIS — N186 End stage renal disease: Secondary | ICD-10-CM | POA: Diagnosis not present

## 2019-11-28 DIAGNOSIS — N2581 Secondary hyperparathyroidism of renal origin: Secondary | ICD-10-CM | POA: Diagnosis not present

## 2019-11-28 DIAGNOSIS — N186 End stage renal disease: Secondary | ICD-10-CM | POA: Diagnosis not present

## 2019-11-28 DIAGNOSIS — Z992 Dependence on renal dialysis: Secondary | ICD-10-CM | POA: Diagnosis not present

## 2019-11-30 DIAGNOSIS — Z992 Dependence on renal dialysis: Secondary | ICD-10-CM | POA: Diagnosis not present

## 2019-11-30 DIAGNOSIS — N186 End stage renal disease: Secondary | ICD-10-CM | POA: Diagnosis not present

## 2019-11-30 DIAGNOSIS — N2581 Secondary hyperparathyroidism of renal origin: Secondary | ICD-10-CM | POA: Diagnosis not present

## 2019-12-03 DIAGNOSIS — N2581 Secondary hyperparathyroidism of renal origin: Secondary | ICD-10-CM | POA: Diagnosis not present

## 2019-12-03 DIAGNOSIS — Z992 Dependence on renal dialysis: Secondary | ICD-10-CM | POA: Diagnosis not present

## 2019-12-03 DIAGNOSIS — N186 End stage renal disease: Secondary | ICD-10-CM | POA: Diagnosis not present

## 2019-12-05 DIAGNOSIS — N186 End stage renal disease: Secondary | ICD-10-CM | POA: Diagnosis not present

## 2019-12-05 DIAGNOSIS — N2581 Secondary hyperparathyroidism of renal origin: Secondary | ICD-10-CM | POA: Diagnosis not present

## 2019-12-05 DIAGNOSIS — Z992 Dependence on renal dialysis: Secondary | ICD-10-CM | POA: Diagnosis not present

## 2019-12-08 DIAGNOSIS — E1122 Type 2 diabetes mellitus with diabetic chronic kidney disease: Secondary | ICD-10-CM | POA: Diagnosis not present

## 2019-12-08 DIAGNOSIS — N186 End stage renal disease: Secondary | ICD-10-CM | POA: Diagnosis not present

## 2019-12-08 DIAGNOSIS — Z992 Dependence on renal dialysis: Secondary | ICD-10-CM | POA: Diagnosis not present

## 2019-12-08 DIAGNOSIS — N2581 Secondary hyperparathyroidism of renal origin: Secondary | ICD-10-CM | POA: Diagnosis not present

## 2019-12-12 DIAGNOSIS — N2581 Secondary hyperparathyroidism of renal origin: Secondary | ICD-10-CM | POA: Diagnosis not present

## 2019-12-12 DIAGNOSIS — N186 End stage renal disease: Secondary | ICD-10-CM | POA: Diagnosis not present

## 2019-12-12 DIAGNOSIS — Z992 Dependence on renal dialysis: Secondary | ICD-10-CM | POA: Diagnosis not present

## 2019-12-14 DIAGNOSIS — E1140 Type 2 diabetes mellitus with diabetic neuropathy, unspecified: Secondary | ICD-10-CM | POA: Diagnosis not present

## 2019-12-14 DIAGNOSIS — E1122 Type 2 diabetes mellitus with diabetic chronic kidney disease: Secondary | ICD-10-CM | POA: Diagnosis not present

## 2019-12-14 DIAGNOSIS — E11319 Type 2 diabetes mellitus with unspecified diabetic retinopathy without macular edema: Secondary | ICD-10-CM | POA: Diagnosis not present

## 2019-12-14 DIAGNOSIS — N186 End stage renal disease: Secondary | ICD-10-CM | POA: Diagnosis not present

## 2019-12-14 DIAGNOSIS — N2581 Secondary hyperparathyroidism of renal origin: Secondary | ICD-10-CM | POA: Diagnosis not present

## 2019-12-14 DIAGNOSIS — Z992 Dependence on renal dialysis: Secondary | ICD-10-CM | POA: Diagnosis not present

## 2019-12-16 DIAGNOSIS — Z992 Dependence on renal dialysis: Secondary | ICD-10-CM | POA: Diagnosis not present

## 2019-12-16 DIAGNOSIS — N186 End stage renal disease: Secondary | ICD-10-CM | POA: Diagnosis not present

## 2019-12-16 DIAGNOSIS — N2581 Secondary hyperparathyroidism of renal origin: Secondary | ICD-10-CM | POA: Diagnosis not present

## 2019-12-19 DIAGNOSIS — Z992 Dependence on renal dialysis: Secondary | ICD-10-CM | POA: Diagnosis not present

## 2019-12-19 DIAGNOSIS — N2581 Secondary hyperparathyroidism of renal origin: Secondary | ICD-10-CM | POA: Diagnosis not present

## 2019-12-19 DIAGNOSIS — N186 End stage renal disease: Secondary | ICD-10-CM | POA: Diagnosis not present

## 2019-12-20 DIAGNOSIS — H3582 Retinal ischemia: Secondary | ICD-10-CM | POA: Diagnosis not present

## 2019-12-20 DIAGNOSIS — H26491 Other secondary cataract, right eye: Secondary | ICD-10-CM | POA: Diagnosis not present

## 2019-12-20 DIAGNOSIS — E113593 Type 2 diabetes mellitus with proliferative diabetic retinopathy without macular edema, bilateral: Secondary | ICD-10-CM | POA: Diagnosis not present

## 2019-12-21 DIAGNOSIS — N186 End stage renal disease: Secondary | ICD-10-CM | POA: Diagnosis not present

## 2019-12-21 DIAGNOSIS — Z992 Dependence on renal dialysis: Secondary | ICD-10-CM | POA: Diagnosis not present

## 2019-12-21 DIAGNOSIS — N2581 Secondary hyperparathyroidism of renal origin: Secondary | ICD-10-CM | POA: Diagnosis not present

## 2019-12-22 DIAGNOSIS — N186 End stage renal disease: Secondary | ICD-10-CM | POA: Diagnosis not present

## 2019-12-22 DIAGNOSIS — I871 Compression of vein: Secondary | ICD-10-CM | POA: Diagnosis not present

## 2019-12-22 DIAGNOSIS — T82858A Stenosis of vascular prosthetic devices, implants and grafts, initial encounter: Secondary | ICD-10-CM | POA: Diagnosis not present

## 2019-12-22 DIAGNOSIS — Z992 Dependence on renal dialysis: Secondary | ICD-10-CM | POA: Diagnosis not present

## 2019-12-23 DIAGNOSIS — Z992 Dependence on renal dialysis: Secondary | ICD-10-CM | POA: Diagnosis not present

## 2019-12-23 DIAGNOSIS — N186 End stage renal disease: Secondary | ICD-10-CM | POA: Diagnosis not present

## 2019-12-23 DIAGNOSIS — N2581 Secondary hyperparathyroidism of renal origin: Secondary | ICD-10-CM | POA: Diagnosis not present

## 2019-12-26 DIAGNOSIS — N186 End stage renal disease: Secondary | ICD-10-CM | POA: Diagnosis not present

## 2019-12-26 DIAGNOSIS — N2581 Secondary hyperparathyroidism of renal origin: Secondary | ICD-10-CM | POA: Diagnosis not present

## 2019-12-26 DIAGNOSIS — Z992 Dependence on renal dialysis: Secondary | ICD-10-CM | POA: Diagnosis not present

## 2019-12-28 DIAGNOSIS — N2581 Secondary hyperparathyroidism of renal origin: Secondary | ICD-10-CM | POA: Diagnosis not present

## 2019-12-28 DIAGNOSIS — N186 End stage renal disease: Secondary | ICD-10-CM | POA: Diagnosis not present

## 2019-12-28 DIAGNOSIS — Z992 Dependence on renal dialysis: Secondary | ICD-10-CM | POA: Diagnosis not present

## 2019-12-30 DIAGNOSIS — N2581 Secondary hyperparathyroidism of renal origin: Secondary | ICD-10-CM | POA: Diagnosis not present

## 2019-12-30 DIAGNOSIS — Z992 Dependence on renal dialysis: Secondary | ICD-10-CM | POA: Diagnosis not present

## 2019-12-30 DIAGNOSIS — N186 End stage renal disease: Secondary | ICD-10-CM | POA: Diagnosis not present

## 2020-01-02 DIAGNOSIS — H26493 Other secondary cataract, bilateral: Secondary | ICD-10-CM | POA: Diagnosis not present

## 2020-01-02 DIAGNOSIS — Z992 Dependence on renal dialysis: Secondary | ICD-10-CM | POA: Diagnosis not present

## 2020-01-02 DIAGNOSIS — N186 End stage renal disease: Secondary | ICD-10-CM | POA: Diagnosis not present

## 2020-01-02 DIAGNOSIS — N2581 Secondary hyperparathyroidism of renal origin: Secondary | ICD-10-CM | POA: Diagnosis not present

## 2020-01-04 DIAGNOSIS — N2581 Secondary hyperparathyroidism of renal origin: Secondary | ICD-10-CM | POA: Diagnosis not present

## 2020-01-04 DIAGNOSIS — Z992 Dependence on renal dialysis: Secondary | ICD-10-CM | POA: Diagnosis not present

## 2020-01-04 DIAGNOSIS — N186 End stage renal disease: Secondary | ICD-10-CM | POA: Diagnosis not present

## 2020-01-06 DIAGNOSIS — N2581 Secondary hyperparathyroidism of renal origin: Secondary | ICD-10-CM | POA: Diagnosis not present

## 2020-01-06 DIAGNOSIS — N186 End stage renal disease: Secondary | ICD-10-CM | POA: Diagnosis not present

## 2020-01-06 DIAGNOSIS — Z992 Dependence on renal dialysis: Secondary | ICD-10-CM | POA: Diagnosis not present

## 2020-01-08 DIAGNOSIS — N186 End stage renal disease: Secondary | ICD-10-CM | POA: Diagnosis not present

## 2020-01-08 DIAGNOSIS — Z992 Dependence on renal dialysis: Secondary | ICD-10-CM | POA: Diagnosis not present

## 2020-01-08 DIAGNOSIS — E1122 Type 2 diabetes mellitus with diabetic chronic kidney disease: Secondary | ICD-10-CM | POA: Diagnosis not present

## 2020-01-09 DIAGNOSIS — N2581 Secondary hyperparathyroidism of renal origin: Secondary | ICD-10-CM | POA: Diagnosis not present

## 2020-01-09 DIAGNOSIS — N186 End stage renal disease: Secondary | ICD-10-CM | POA: Diagnosis not present

## 2020-01-09 DIAGNOSIS — Z992 Dependence on renal dialysis: Secondary | ICD-10-CM | POA: Diagnosis not present

## 2020-01-10 DIAGNOSIS — H26491 Other secondary cataract, right eye: Secondary | ICD-10-CM | POA: Diagnosis not present

## 2020-01-11 DIAGNOSIS — Z992 Dependence on renal dialysis: Secondary | ICD-10-CM | POA: Diagnosis not present

## 2020-01-11 DIAGNOSIS — N2581 Secondary hyperparathyroidism of renal origin: Secondary | ICD-10-CM | POA: Diagnosis not present

## 2020-01-11 DIAGNOSIS — N186 End stage renal disease: Secondary | ICD-10-CM | POA: Diagnosis not present

## 2020-01-13 DIAGNOSIS — Z992 Dependence on renal dialysis: Secondary | ICD-10-CM | POA: Diagnosis not present

## 2020-01-13 DIAGNOSIS — N2581 Secondary hyperparathyroidism of renal origin: Secondary | ICD-10-CM | POA: Diagnosis not present

## 2020-01-13 DIAGNOSIS — N186 End stage renal disease: Secondary | ICD-10-CM | POA: Diagnosis not present

## 2020-01-16 DIAGNOSIS — N186 End stage renal disease: Secondary | ICD-10-CM | POA: Diagnosis not present

## 2020-01-16 DIAGNOSIS — E1122 Type 2 diabetes mellitus with diabetic chronic kidney disease: Secondary | ICD-10-CM | POA: Diagnosis not present

## 2020-01-16 DIAGNOSIS — Z992 Dependence on renal dialysis: Secondary | ICD-10-CM | POA: Diagnosis not present

## 2020-01-16 DIAGNOSIS — E11319 Type 2 diabetes mellitus with unspecified diabetic retinopathy without macular edema: Secondary | ICD-10-CM | POA: Diagnosis not present

## 2020-01-16 DIAGNOSIS — F4321 Adjustment disorder with depressed mood: Secondary | ICD-10-CM | POA: Diagnosis not present

## 2020-01-16 DIAGNOSIS — N2581 Secondary hyperparathyroidism of renal origin: Secondary | ICD-10-CM | POA: Diagnosis not present

## 2020-01-17 DIAGNOSIS — Z961 Presence of intraocular lens: Secondary | ICD-10-CM | POA: Diagnosis not present

## 2020-01-18 DIAGNOSIS — N186 End stage renal disease: Secondary | ICD-10-CM | POA: Diagnosis not present

## 2020-01-18 DIAGNOSIS — Z992 Dependence on renal dialysis: Secondary | ICD-10-CM | POA: Diagnosis not present

## 2020-01-18 DIAGNOSIS — N2581 Secondary hyperparathyroidism of renal origin: Secondary | ICD-10-CM | POA: Diagnosis not present

## 2020-01-20 DIAGNOSIS — Z23 Encounter for immunization: Secondary | ICD-10-CM | POA: Diagnosis not present

## 2020-01-20 DIAGNOSIS — N186 End stage renal disease: Secondary | ICD-10-CM | POA: Diagnosis not present

## 2020-01-20 DIAGNOSIS — Z992 Dependence on renal dialysis: Secondary | ICD-10-CM | POA: Diagnosis not present

## 2020-01-23 DIAGNOSIS — N186 End stage renal disease: Secondary | ICD-10-CM | POA: Diagnosis not present

## 2020-01-23 DIAGNOSIS — Z992 Dependence on renal dialysis: Secondary | ICD-10-CM | POA: Diagnosis not present

## 2020-01-23 DIAGNOSIS — N2581 Secondary hyperparathyroidism of renal origin: Secondary | ICD-10-CM | POA: Diagnosis not present

## 2020-01-25 ENCOUNTER — Encounter: Payer: Self-pay | Admitting: Gastroenterology

## 2020-01-25 DIAGNOSIS — Z992 Dependence on renal dialysis: Secondary | ICD-10-CM | POA: Diagnosis not present

## 2020-01-25 DIAGNOSIS — N186 End stage renal disease: Secondary | ICD-10-CM | POA: Diagnosis not present

## 2020-01-25 DIAGNOSIS — N2581 Secondary hyperparathyroidism of renal origin: Secondary | ICD-10-CM | POA: Diagnosis not present

## 2020-01-27 DIAGNOSIS — Z992 Dependence on renal dialysis: Secondary | ICD-10-CM | POA: Diagnosis not present

## 2020-01-27 DIAGNOSIS — N2581 Secondary hyperparathyroidism of renal origin: Secondary | ICD-10-CM | POA: Diagnosis not present

## 2020-01-27 DIAGNOSIS — N186 End stage renal disease: Secondary | ICD-10-CM | POA: Diagnosis not present

## 2020-01-30 DIAGNOSIS — Z992 Dependence on renal dialysis: Secondary | ICD-10-CM | POA: Diagnosis not present

## 2020-01-30 DIAGNOSIS — N2581 Secondary hyperparathyroidism of renal origin: Secondary | ICD-10-CM | POA: Diagnosis not present

## 2020-01-30 DIAGNOSIS — N186 End stage renal disease: Secondary | ICD-10-CM | POA: Diagnosis not present

## 2020-02-01 DIAGNOSIS — Z992 Dependence on renal dialysis: Secondary | ICD-10-CM | POA: Diagnosis not present

## 2020-02-01 DIAGNOSIS — N2581 Secondary hyperparathyroidism of renal origin: Secondary | ICD-10-CM | POA: Diagnosis not present

## 2020-02-01 DIAGNOSIS — N186 End stage renal disease: Secondary | ICD-10-CM | POA: Diagnosis not present

## 2020-02-03 DIAGNOSIS — N186 End stage renal disease: Secondary | ICD-10-CM | POA: Diagnosis not present

## 2020-02-03 DIAGNOSIS — Z992 Dependence on renal dialysis: Secondary | ICD-10-CM | POA: Diagnosis not present

## 2020-02-03 DIAGNOSIS — N2581 Secondary hyperparathyroidism of renal origin: Secondary | ICD-10-CM | POA: Diagnosis not present

## 2020-02-05 DIAGNOSIS — N186 End stage renal disease: Secondary | ICD-10-CM | POA: Diagnosis not present

## 2020-02-05 DIAGNOSIS — Z992 Dependence on renal dialysis: Secondary | ICD-10-CM | POA: Diagnosis not present

## 2020-02-05 DIAGNOSIS — E1122 Type 2 diabetes mellitus with diabetic chronic kidney disease: Secondary | ICD-10-CM | POA: Diagnosis not present

## 2020-02-06 DIAGNOSIS — Z992 Dependence on renal dialysis: Secondary | ICD-10-CM | POA: Diagnosis not present

## 2020-02-06 DIAGNOSIS — N2581 Secondary hyperparathyroidism of renal origin: Secondary | ICD-10-CM | POA: Diagnosis not present

## 2020-02-06 DIAGNOSIS — N186 End stage renal disease: Secondary | ICD-10-CM | POA: Diagnosis not present

## 2020-02-08 ENCOUNTER — Other Ambulatory Visit: Payer: Self-pay

## 2020-02-08 ENCOUNTER — Ambulatory Visit (INDEPENDENT_AMBULATORY_CARE_PROVIDER_SITE_OTHER): Payer: Federal, State, Local not specified - PPO | Admitting: Gastroenterology

## 2020-02-08 ENCOUNTER — Encounter: Payer: Self-pay | Admitting: Gastroenterology

## 2020-02-08 VITALS — BP 134/72 | HR 96 | Temp 97.5°F | Ht 64.0 in | Wt 153.2 lb

## 2020-02-08 DIAGNOSIS — Z86718 Personal history of other venous thrombosis and embolism: Secondary | ICD-10-CM | POA: Diagnosis not present

## 2020-02-08 DIAGNOSIS — Z1211 Encounter for screening for malignant neoplasm of colon: Secondary | ICD-10-CM

## 2020-02-08 DIAGNOSIS — Z01818 Encounter for other preprocedural examination: Secondary | ICD-10-CM | POA: Diagnosis not present

## 2020-02-08 DIAGNOSIS — N186 End stage renal disease: Secondary | ICD-10-CM | POA: Diagnosis not present

## 2020-02-08 DIAGNOSIS — N2581 Secondary hyperparathyroidism of renal origin: Secondary | ICD-10-CM | POA: Diagnosis not present

## 2020-02-08 DIAGNOSIS — Z992 Dependence on renal dialysis: Secondary | ICD-10-CM | POA: Diagnosis not present

## 2020-02-08 MED ORDER — CLENPIQ 10-3.5-12 MG-GM -GM/160ML PO SOLN: 1.0000 | Freq: Once | ORAL | 0 refills | Status: AC

## 2020-02-08 NOTE — Patient Instructions (Signed)
You have been scheduled for a colonoscopy. Please follow written instructions given to you at your visit today.  Please pick up your prep supplies at the pharmacy within the next 1-3 days. If you use inhalers (even only as needed), please bring them with you on the day of your procedure. Your physician has requested that you go to www.startemmi.com and enter the access code given to you at your visit today. This web site gives a general overview about your procedure. However, you should still follow specific instructions given to you by our office regarding your preparation for the procedure.  It was a pleasure to see you today!  Vito Cirigliano, D.O.  

## 2020-02-08 NOTE — H&P (View-Only) (Signed)
Chief Complaint: Colon cancer screening  Referring Provider:     [name], DO   HPI:    Colleen [name] is a 59 y.o. female with a history of ESRD on HD MWF, diabetes, prior DVT/PE, hypertension, hyperlipidemia, PVD, chronic anemia, referred to the Gastroenterology Clinic for evaluation of colon cancer screening.  Follows in the transplant nephrology and surgery clinic set [facility].  Provoked LLE DVT 2019 following left meniscal tear, treated with apixaban, which was discontinued due to recurrent bleeding in eye.  DVT/PE in 09/2013 in the setting of E. coli urosepsis, treated with Coumadin for some unknown period time.  Seen in ED Hematology clinic at [facility] 07/2019 for management of prior DVT/PE in anticipation of potential kidney transplant, with no plan for long-term anticoagulation given provocative events prior to clot.  Cologuard ordered in 07/2019-sample could not be processed.   Needs colonoscopy as part of pre-transplant evaluation. She is otherwise without any GI sxs. No previous colonoscopy/CRC screening.    Received Covid 19 vaccine Labs 01/18/2020: Hemoglobin 8.8 (stable from previous). Labs from 01/10/2019: Hemoglobin 8.8, WBC 3.4, PLT 107, ferritin 1567  Takes oral iron and occasionally receives IV iron at HD per patient.  She does not think she is ever had a blood transfusion in the past.  Per available notes, hemoglobin has been stable admitted.  Has received vaccine for hepatitis B with appropriate imaging.  No known family history of CRC, GI malignancy, liver disease, pancreatic disease, or IBD.     Past Medical History:  Diagnosis Date  . Anemia   . Chronic kidney disease    ESRD [place] -  . Diabetes ([place])    type II  . DVT (deep venous thrombosis) ([place])   . E-coli UTI   . Gout 06/05/2018  . History of blood transfusion   . HLD (hyperlipidemia) 06/05/2018  . Hypertension   . Kidney failure   . PE (pulmonary embolism)   . 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 A/V FISTULAGRAM Left 07/29/2019   Procedure: A/V FISTULAGRAM;  Surgeon: Elam Dutch, MD;  Location: [place] CV LAB;  Service: Cardiovascular;  Laterality: Left;  . ARTERY EXPLORATION Left 06/14/2019   Procedure: Artery Exploration Left Upper Arm;  Surgeon: Elam Dutch, MD;  Location: [facility] OR;  Service: Vascular;  Laterality: Left;  . AV FISTULA PLACEMENT Left 12/31/2018   Procedure: ARTERIOVENOUS (AV) FISTULA CREATION VERSUS INSERTION OF ARTERIOVENOUS GRAFT LEFT ARM;  Surgeon: Elam Dutch, MD;  Location: [place];  Service: Vascular;  Laterality: Left;  . BASCILIC VEIN TRANSPOSITION Left 06/14/2019   Procedure: SECOND STAGE BASILIC VEIN TRANSPOSITION LEFT ARM;  Surgeon: Elam Dutch, MD;  Location: [place];  Service: Vascular;  Laterality: Left;  . EYE SURGERY     9 total eye surgery including laser and cararact surgery  . INSERTION OF DIALYSIS CATHETER N/A 12/31/2018   Procedure: INSERTION OF TUNNELED  DIALYSIS CATHETER;  Surgeon: Elam Dutch, MD;  Location: [facility] OR;  Service: Vascular;  Laterality: N/A;  . PERIPHERAL VASCULAR BALLOON ANGIOPLASTY Left 07/29/2019   Procedure: PERIPHERAL VASCULAR BALLOON ANGIOPLASTY;  Surgeon: Elam Dutch, MD;  Location: [place] CV LAB;  Service: Cardiovascular;  Laterality: Left;  ARM FISTULA   Family History  Problem Relation  Age of Onset  . CAD Neg Hx   . Colon cancer Neg Hx   . Esophageal cancer Neg Hx   . Cancer Neg Hx    Social History   Tobacco Use  . Smoking status: Never Smoker  . Smokeless tobacco: Never Used  Substance Use Topics  . Alcohol use: No  . Drug use: No   Current Outpatient Medications  Medication Sig Dispense Refill  . amLODipine (NORVASC) 10 MG tablet Take 1 tablet (10 mg total) by mouth daily for 21 days. 21 tablet 0  . calcitRIOL (ROCALTROL) 0.25 MCG capsule Take 0.25  mcg by mouth every Monday, Wednesday, and Friday.    . carvedilol (COREG) 12.5 MG tablet Take 1 tablet (12.5 mg total) by mouth 2 (two) times daily with a meal for 21 days. 42 tablet 0  . ferric citrate (AURYXIA) 1 GM 210 MG(Fe) tablet Take 210 mg by mouth 3 (three) times daily with meals.    . insulin detemir (LEVEMIR) 100 UNIT/ML injection Inject 0.04 mLs (4 Units total) into the skin at bedtime. (Patient taking differently: Inject into the skin as directed. On a sliding scale) 10 mL 11  . Insulin Glargine (BASAGLAR KWIKPEN [place]) Inject into the skin as directed. On a sliding scale    . calcium acetate (PHOSLO) 667 MG capsule Take 2 capsules (1,334 mg total) by mouth 3 (three) times daily with meals. (Patient not taking: Reported on 06/13/2019) 90 capsule 0   No current facility-administered medications for this visit.   Allergies  Allergen Reactions  . Hydrocodone Other (See Comments)    Increase Glucose level per pt     Review of Systems: All systems reviewed and negative except where noted in HPI.     Physical Exam:    Wt Readings from Last 3 Encounters:  02/08/20 153 lb 4 oz (69.5 kg)  07/29/19 146 lb (66.2 kg)  07/21/19 153 lb (69.4 kg)    BP 134/72   Pulse 96   Temp (!) 97.5 F (36.4 C)   Ht 5' 4"  (1.626 m)   Wt 153 lb 4 oz (69.5 kg)   LMP 12/08/2013   BMI 26.31 kg/m  Constitutional:  Pleasant, in no acute distress. Psychiatric: Normal mood and affect. Behavior is normal. EENT: Pupils normal.  Conjunctivae are normal. No scleral icterus. Neck supple. No cervical LAD. Cardiovascular: Normal rate, regular rhythm.  Murmur consistent with AV graft Pulmonary/chest: Effort normal and breath sounds normal. No wheezing, rales or rhonchi. Abdominal: Soft, nondistended, nontender. Bowel sounds active throughout. There are no masses palpable. No hepatomegaly. Neurological: Alert and oriented to person place and time. Skin: Skin is warm and dry. No rashes noted.   ASSESSMENT  AND PLAN;   1) CRC screening 59 year old female presents for initial, average risk CRC screening.  Discussed the screening options to include optical colonoscopy, FIT kit, Cologuard, etc., and she wishes to proceed with optical colonoscopy.  Discussed elevated procedural risks given ESRD/HD and will perform procedure at [facility].  -Schedule colonoscopy at [facility]  2) ESRD on HD MWF 3) Anemia of chronic disease -We will coordinate colonoscopy around dialysis schedule  4) History of DVT/PE -History of provoked DVT and PE.  As these events were provoked, Hematology recommends against long-term anticoagulation  The indications, risks, and benefits of colonoscopy were explained to the patient in detail. Risks include but are not limited to bleeding, perforation, adverse reaction to medications, and cardiopulmonary compromise. Sequelae include but are not limited to  the possibility of surgery, hospitalization, and mortality.  Elevated periprocedural risks due to chronic medical issues.  The patient verbalized understanding and wished to proceed. All questions answered, referred to the scheduler and bowel prep ordered. Further recommendations pending results of the exam.     Lavena Bullion, DO, FACG  02/08/2020, 2:31 PM   [name], DO

## 2020-02-08 NOTE — Progress Notes (Signed)
Chief Complaint: Colon cancer screening  Referring Provider:     [name], DO   HPI:    Colleen [name] is a 59 y.o. female with a history of ESRD on HD MWF, diabetes, prior DVT/PE, hypertension, hyperlipidemia, PVD, chronic anemia, referred to the Gastroenterology Clinic for evaluation of colon cancer screening.  Follows in the transplant nephrology and surgery clinic set [facility]  Provoked LLE DVT 2019 following left meniscal tear, treated with apixaban, which was discontinued due to recurrent bleeding in eye.  DVT/PE in 09/2013 in the setting of E. coli urosepsis, treated with Coumadin for some unknown period time.  Seen in ED Hematology clinic at [facility] 07/2019 for management of prior DVT/PE in anticipation of potential kidney transplant, with no plan for long-term anticoagulation given provocative events prior to clot.  Cologuard ordered in 07/2019-sample could not be processed.   Needs colonoscopy as part of pre-transplant evaluation. She is otherwise without any GI sxs. No previous colonoscopy/CRC screening.    Received Covid 19 vaccine Labs 01/18/2020: Hemoglobin 8.8 (stable from previous). Labs from 01/10/2019: Hemoglobin 8.8, WBC 3.4, PLT 107, ferritin 1567  Takes oral iron and occasionally receives IV iron at HD per patient.  She does not think she is ever had a blood transfusion in the past.  Per available notes, hemoglobin has been stable admitted.  Has received vaccine for hepatitis B with appropriate imaging.  No known family history of CRC, GI malignancy, liver disease, pancreatic disease, or IBD.     Past Medical History:  Diagnosis Date  . Anemia   . Chronic kidney disease    ESRD [place] -  . Diabetes ([place])    type II  . DVT (deep venous thrombosis) ([place])   . E-coli UTI   . Gout 06/05/2018  . History of blood transfusion   . HLD (hyperlipidemia) 06/05/2018  . Hypertension   . Kidney failure   . PE (pulmonary embolism)   . 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 A/V FISTULAGRAM Left 07/29/2019   Procedure: A/V FISTULAGRAM;  Surgeon: Elam Dutch, MD;  Location: [place] CV LAB;  Service: Cardiovascular;  Laterality: Left;  . ARTERY EXPLORATION Left 06/14/2019   Procedure: Artery Exploration Left Upper Arm;  Surgeon: Elam Dutch, MD;  Location: [facility] OR;  Service: Vascular;  Laterality: Left;  . AV FISTULA PLACEMENT Left 12/31/2018   Procedure: ARTERIOVENOUS (AV) FISTULA CREATION VERSUS INSERTION OF ARTERIOVENOUS GRAFT LEFT ARM;  Surgeon: Elam Dutch, MD;  Location: [place];  Service: Vascular;  Laterality: Left;  . BASCILIC VEIN TRANSPOSITION Left 06/14/2019   Procedure: SECOND STAGE BASILIC VEIN TRANSPOSITION LEFT ARM;  Surgeon: Elam Dutch, MD;  Location: [place];  Service: Vascular;  Laterality: Left;  . EYE SURGERY     9 total eye surgery including laser and cararact surgery  . INSERTION OF DIALYSIS CATHETER N/A 12/31/2018   Procedure: INSERTION OF TUNNELED  DIALYSIS CATHETER;  Surgeon: Elam Dutch, MD;  Location: [facility] OR;  Service: Vascular;  Laterality: N/A;  . PERIPHERAL VASCULAR BALLOON ANGIOPLASTY Left 07/29/2019   Procedure: PERIPHERAL VASCULAR BALLOON ANGIOPLASTY;  Surgeon: Elam Dutch, MD;  Location: [place] CV LAB;  Service: Cardiovascular;  Laterality: Left;  ARM FISTULA   Family History  Problem Relation  Age of Onset  . CAD Neg Hx   . Colon cancer Neg Hx   . Esophageal cancer Neg Hx   . Cancer Neg Hx    Social History   Tobacco Use  . Smoking status: Never Smoker  . Smokeless tobacco: Never Used  Substance Use Topics  . Alcohol use: No  . Drug use: No   Current Outpatient Medications  Medication Sig Dispense Refill  . amLODipine (NORVASC) 10 MG tablet Take 1 tablet (10 mg total) by mouth daily for 21 days. 21 tablet 0  . calcitRIOL (ROCALTROL) 0.25 MCG capsule Take 0.25  mcg by mouth every Monday, Wednesday, and Friday.    . carvedilol (COREG) 12.5 MG tablet Take 1 tablet (12.5 mg total) by mouth 2 (two) times daily with a meal for 21 days. 42 tablet 0  . ferric citrate (AURYXIA) 1 GM 210 MG(Fe) tablet Take 210 mg by mouth 3 (three) times daily with meals.    . insulin detemir (LEVEMIR) 100 UNIT/ML injection Inject 0.04 mLs (4 Units total) into the skin at bedtime. (Patient taking differently: Inject into the skin as directed. On a sliding scale) 10 mL 11  . Insulin Glargine (BASAGLAR KWIKPEN [place]) Inject into the skin as directed. On a sliding scale    . calcium acetate (PHOSLO) 667 MG capsule Take 2 capsules (1,334 mg total) by mouth 3 (three) times daily with meals. (Patient not taking: Reported on 06/13/2019) 90 capsule 0   No current facility-administered medications for this visit.   Allergies  Allergen Reactions  . Hydrocodone Other (See Comments)    Increase Glucose level per pt     Review of Systems: All systems reviewed and negative except where noted in HPI.     Physical Exam:    Wt Readings from Last 3 Encounters:  02/08/20 153 lb 4 oz (69.5 kg)  07/29/19 146 lb (66.2 kg)  07/21/19 153 lb (69.4 kg)    BP 134/72   Pulse 96   Temp (!) 97.5 F (36.4 C)   Ht 5' 4"  (1.626 m)   Wt 153 lb 4 oz (69.5 kg)   LMP 12/08/2013   BMI 26.31 kg/m  Constitutional:  Pleasant, in no acute distress. Psychiatric: Normal mood and affect. Behavior is normal. EENT: Pupils normal.  Conjunctivae are normal. No scleral icterus. Neck supple. No cervical LAD. Cardiovascular: Normal rate, regular rhythm.  Murmur consistent with AV graft Pulmonary/chest: Effort normal and breath sounds normal. No wheezing, rales or rhonchi. Abdominal: Soft, nondistended, nontender. Bowel sounds active throughout. There are no masses palpable. No hepatomegaly. Neurological: Alert and oriented to person place and time. Skin: Skin is warm and dry. No rashes noted.   ASSESSMENT  AND PLAN;   1) CRC screening 59 year old female presents for initial, average risk CRC screening.  Discussed the screening options to include optical colonoscopy, FIT kit, Cologuard, etc., and she wishes to proceed with optical colonoscopy.  Discussed elevated procedural risks given ESRD/HD and will perform procedure at [facility].  -Schedule colonoscopy at [facility]  2) ESRD on HD MWF 3) Anemia of chronic disease -We will coordinate colonoscopy around dialysis schedule  4) History of DVT/PE -History of provoked DVT and PE.  As these events were provoked, Hematology recommends against long-term anticoagulation  The indications, risks, and benefits of colonoscopy were explained to the patient in detail. Risks include but are not limited to bleeding, perforation, adverse reaction to medications, and cardiopulmonary compromise. Sequelae include but are not limited to  the possibility of surgery, hospitalization, and mortality.  Elevated periprocedural risks due to chronic medical issues.  The patient verbalized understanding and wished to proceed. All questions answered, referred to the scheduler and bowel prep ordered. Further recommendations pending results of the exam.     Lavena Bullion, DO, FACG  02/08/2020, 2:31 PM   [name], DO

## 2020-02-10 DIAGNOSIS — N186 End stage renal disease: Secondary | ICD-10-CM | POA: Diagnosis not present

## 2020-02-10 DIAGNOSIS — Z992 Dependence on renal dialysis: Secondary | ICD-10-CM | POA: Diagnosis not present

## 2020-02-10 DIAGNOSIS — N2581 Secondary hyperparathyroidism of renal origin: Secondary | ICD-10-CM | POA: Diagnosis not present

## 2020-02-13 DIAGNOSIS — N2581 Secondary hyperparathyroidism of renal origin: Secondary | ICD-10-CM | POA: Diagnosis not present

## 2020-02-13 DIAGNOSIS — Z992 Dependence on renal dialysis: Secondary | ICD-10-CM | POA: Diagnosis not present

## 2020-02-13 DIAGNOSIS — N186 End stage renal disease: Secondary | ICD-10-CM | POA: Diagnosis not present

## 2020-02-15 DIAGNOSIS — N2581 Secondary hyperparathyroidism of renal origin: Secondary | ICD-10-CM | POA: Diagnosis not present

## 2020-02-15 DIAGNOSIS — Z992 Dependence on renal dialysis: Secondary | ICD-10-CM | POA: Diagnosis not present

## 2020-02-15 DIAGNOSIS — N186 End stage renal disease: Secondary | ICD-10-CM | POA: Diagnosis not present

## 2020-02-17 DIAGNOSIS — N2581 Secondary hyperparathyroidism of renal origin: Secondary | ICD-10-CM | POA: Diagnosis not present

## 2020-02-17 DIAGNOSIS — Z992 Dependence on renal dialysis: Secondary | ICD-10-CM | POA: Diagnosis not present

## 2020-02-17 DIAGNOSIS — N186 End stage renal disease: Secondary | ICD-10-CM | POA: Diagnosis not present

## 2020-02-17 DIAGNOSIS — Z23 Encounter for immunization: Secondary | ICD-10-CM | POA: Diagnosis not present

## 2020-02-20 DIAGNOSIS — Z992 Dependence on renal dialysis: Secondary | ICD-10-CM | POA: Diagnosis not present

## 2020-02-20 DIAGNOSIS — N2581 Secondary hyperparathyroidism of renal origin: Secondary | ICD-10-CM | POA: Diagnosis not present

## 2020-02-20 DIAGNOSIS — N186 End stage renal disease: Secondary | ICD-10-CM | POA: Diagnosis not present

## 2020-02-22 DIAGNOSIS — Z992 Dependence on renal dialysis: Secondary | ICD-10-CM | POA: Diagnosis not present

## 2020-02-22 DIAGNOSIS — N186 End stage renal disease: Secondary | ICD-10-CM | POA: Diagnosis not present

## 2020-02-22 DIAGNOSIS — N2581 Secondary hyperparathyroidism of renal origin: Secondary | ICD-10-CM | POA: Diagnosis not present

## 2020-02-24 DIAGNOSIS — N186 End stage renal disease: Secondary | ICD-10-CM | POA: Diagnosis not present

## 2020-02-24 DIAGNOSIS — N2581 Secondary hyperparathyroidism of renal origin: Secondary | ICD-10-CM | POA: Diagnosis not present

## 2020-02-24 DIAGNOSIS — Z992 Dependence on renal dialysis: Secondary | ICD-10-CM | POA: Diagnosis not present

## 2020-02-27 DIAGNOSIS — N2581 Secondary hyperparathyroidism of renal origin: Secondary | ICD-10-CM | POA: Diagnosis not present

## 2020-02-27 DIAGNOSIS — Z992 Dependence on renal dialysis: Secondary | ICD-10-CM | POA: Diagnosis not present

## 2020-02-27 DIAGNOSIS — N186 End stage renal disease: Secondary | ICD-10-CM | POA: Diagnosis not present

## 2020-02-29 DIAGNOSIS — N2581 Secondary hyperparathyroidism of renal origin: Secondary | ICD-10-CM | POA: Diagnosis not present

## 2020-02-29 DIAGNOSIS — N186 End stage renal disease: Secondary | ICD-10-CM | POA: Diagnosis not present

## 2020-02-29 DIAGNOSIS — Z992 Dependence on renal dialysis: Secondary | ICD-10-CM | POA: Diagnosis not present

## 2020-03-01 ENCOUNTER — Other Ambulatory Visit (HOSPITAL_COMMUNITY)
Admission: RE | Admit: 2020-03-01 | Discharge: 2020-03-01 | Disposition: A | Discharge status: Home / Self Care | Payer: Federal, State, Local not specified - PPO | Source: Ambulatory Visit | Attending: Gastroenterology | Admitting: Gastroenterology

## 2020-03-01 ENCOUNTER — Telehealth: Payer: Self-pay | Admitting: Gastroenterology

## 2020-03-01 DIAGNOSIS — Z01812 Encounter for preprocedural laboratory examination: Secondary | ICD-10-CM | POA: Insufficient documentation

## 2020-03-01 DIAGNOSIS — Z20822 Contact with and (suspected) exposure to covid-19: Secondary | ICD-10-CM | POA: Diagnosis not present

## 2020-03-01 LAB — SARS CORONAVIRUS 2 (TAT 6-24 HRS): SARS Coronavirus 2: NEGATIVE

## 2020-03-01 NOTE — Telephone Encounter (Signed)
Pt called stating that copay for clenpiq is over $75 and she cannoty afford it. She also mentioned that instructions that she received were for miralax so she is a bit confuse. Pls call her.

## 2020-03-01 NOTE — Telephone Encounter (Signed)
Spoke to patient to let her know that her instructions are for Miralax. These were given to her at the time of appointment. The prep was initially for Clinpiq, but changed before patient left the office due to medical reasons. She will purchase Miralax for her colonoscopy prep.

## 2020-03-02 DIAGNOSIS — N2581 Secondary hyperparathyroidism of renal origin: Secondary | ICD-10-CM | POA: Diagnosis not present

## 2020-03-02 DIAGNOSIS — Z992 Dependence on renal dialysis: Secondary | ICD-10-CM | POA: Diagnosis not present

## 2020-03-02 DIAGNOSIS — N186 End stage renal disease: Secondary | ICD-10-CM | POA: Diagnosis not present

## 2020-03-05 ENCOUNTER — Other Ambulatory Visit: Payer: Self-pay

## 2020-03-05 ENCOUNTER — Ambulatory Visit (HOSPITAL_COMMUNITY): Payer: Federal, State, Local not specified - PPO | Admitting: Anesthesiology

## 2020-03-05 ENCOUNTER — Ambulatory Visit (HOSPITAL_COMMUNITY)
Admission: RE | Admit: 2020-03-05 | Discharge: 2020-03-05 | Disposition: A | Discharge status: Home / Self Care | Payer: Federal, State, Local not specified - PPO | Attending: Gastroenterology | Admitting: Gastroenterology

## 2020-03-05 ENCOUNTER — Encounter (HOSPITAL_COMMUNITY): Payer: Self-pay | Admitting: Gastroenterology

## 2020-03-05 ENCOUNTER — Encounter (HOSPITAL_COMMUNITY)
Admission: RE | Disposition: A | Discharge status: Home / Self Care | Payer: Self-pay | Source: Home / Self Care | Attending: Gastroenterology

## 2020-03-05 DIAGNOSIS — K640 First degree hemorrhoids: Secondary | ICD-10-CM

## 2020-03-05 DIAGNOSIS — K648 Other hemorrhoids: Secondary | ICD-10-CM | POA: Insufficient documentation

## 2020-03-05 DIAGNOSIS — K6289 Other specified diseases of anus and rectum: Secondary | ICD-10-CM | POA: Insufficient documentation

## 2020-03-05 DIAGNOSIS — D120 Benign neoplasm of cecum: Secondary | ICD-10-CM | POA: Diagnosis not present

## 2020-03-05 DIAGNOSIS — N186 End stage renal disease: Secondary | ICD-10-CM | POA: Insufficient documentation

## 2020-03-05 DIAGNOSIS — E1122 Type 2 diabetes mellitus with diabetic chronic kidney disease: Secondary | ICD-10-CM | POA: Insufficient documentation

## 2020-03-05 DIAGNOSIS — K635 Polyp of colon: Secondary | ICD-10-CM

## 2020-03-05 DIAGNOSIS — Z79899 Other long term (current) drug therapy: Secondary | ICD-10-CM | POA: Diagnosis not present

## 2020-03-05 DIAGNOSIS — Z86718 Personal history of other venous thrombosis and embolism: Secondary | ICD-10-CM | POA: Diagnosis not present

## 2020-03-05 DIAGNOSIS — D124 Benign neoplasm of descending colon: Secondary | ICD-10-CM | POA: Diagnosis not present

## 2020-03-05 DIAGNOSIS — Z992 Dependence on renal dialysis: Secondary | ICD-10-CM | POA: Insufficient documentation

## 2020-03-05 DIAGNOSIS — Z794 Long term (current) use of insulin: Secondary | ICD-10-CM | POA: Insufficient documentation

## 2020-03-05 DIAGNOSIS — K6389 Other specified diseases of intestine: Secondary | ICD-10-CM | POA: Diagnosis not present

## 2020-03-05 DIAGNOSIS — Z86711 Personal history of pulmonary embolism: Secondary | ICD-10-CM | POA: Diagnosis not present

## 2020-03-05 DIAGNOSIS — K621 Rectal polyp: Secondary | ICD-10-CM | POA: Diagnosis not present

## 2020-03-05 DIAGNOSIS — Z1211 Encounter for screening for malignant neoplasm of colon: Secondary | ICD-10-CM | POA: Diagnosis not present

## 2020-03-05 DIAGNOSIS — D122 Benign neoplasm of ascending colon: Secondary | ICD-10-CM

## 2020-03-05 DIAGNOSIS — D631 Anemia in chronic kidney disease: Secondary | ICD-10-CM | POA: Insufficient documentation

## 2020-03-05 DIAGNOSIS — I120 Hypertensive chronic kidney disease with stage 5 chronic kidney disease or end stage renal disease: Secondary | ICD-10-CM | POA: Diagnosis not present

## 2020-03-05 DIAGNOSIS — D125 Benign neoplasm of sigmoid colon: Secondary | ICD-10-CM | POA: Diagnosis not present

## 2020-03-05 HISTORY — PX: HEMOSTASIS CLIP PLACEMENT: SHX6857

## 2020-03-05 HISTORY — PX: COLONOSCOPY WITH PROPOFOL: SHX5780

## 2020-03-05 HISTORY — PX: BIOPSY: SHX5522

## 2020-03-05 HISTORY — PX: POLYPECTOMY: SHX5525

## 2020-03-05 LAB — GLUCOSE, CAPILLARY
Glucose-Capillary: 92 mg/dL (ref 70–99)
Glucose-Capillary: 86 mg/dL (ref 70–99)
Glucose-Capillary: 89 mg/dL (ref 70–99)

## 2020-03-05 SURGERY — COLONOSCOPY WITH PROPOFOL
Anesthesia: Monitor Anesthesia Care

## 2020-03-05 MED ORDER — PROPOFOL 500 MG/50ML IV EMUL
INTRAVENOUS | Status: AC
  Filled 2020-03-05: qty 50

## 2020-03-05 MED ORDER — SODIUM CHLORIDE 0.9 % IV SOLN
INTRAVENOUS | Status: DC
  Administered 2020-03-05: 500 mL via INTRAVENOUS

## 2020-03-05 MED ORDER — PROPOFOL 10 MG/ML IV BOLUS
INTRAVENOUS | Status: DC | PRN
  Administered 2020-03-05 (×2): 20 mg via INTRAVENOUS

## 2020-03-05 MED ORDER — PROPOFOL 500 MG/50ML IV EMUL
INTRAVENOUS | Status: DC | PRN
  Administered 2020-03-05: 125 ug/kg/min via INTRAVENOUS

## 2020-03-05 SURGICAL SUPPLY — 21 items

## 2020-03-05 NOTE — Anesthesia Postprocedure Evaluation (Signed)
Anesthesia Post Note  Patient: Colleen [name]  Procedure(s) Performed: COLONOSCOPY WITH PROPOFOL (N/A ) POLYPECTOMY HEMOSTASIS CLIP PLACEMENT BIOPSY     Patient location during evaluation: PACU Anesthesia Type: MAC Level of consciousness: awake and alert Pain management: pain level controlled Vital Signs Assessment: post-procedure vital signs reviewed and stable Respiratory status: spontaneous breathing, nonlabored ventilation, respiratory function stable and patient connected to nasal cannula oxygen Cardiovascular status: stable and blood pressure returned to baseline Postop Assessment: no apparent nausea or vomiting Anesthetic complications: no    Last Vitals:  Vitals:   03/05/20 1110 03/05/20 1120  BP: (!) 156/68 (!) 146/105  Pulse: 86 87  Resp: 19 18  Temp:    SpO2: 94% 97%    Last Pain:  Vitals:   03/05/20 1120  TempSrc:   PainSc: 0-No pain                 Effie Berkshire

## 2020-03-05 NOTE — Interval H&P Note (Signed)
History and Physical Interval Note:  03/05/2020 10:00 AM  Colleen [name]  has presented today for surgery, with the diagnosis of colon ca screening.  The various methods of treatment have been discussed with the patient and family. After consideration of risks, benefits and other options for treatment, the patient has consented to  Procedure(s): COLONOSCOPY WITH PROPOFOL (N/A) as a surgical intervention.  The patient's history has been reviewed, patient examined, no change in status, stable for surgery.  I have reviewed the patient's chart and labs.  Questions were answered to the patient's satisfaction.     Dominic Pea [name]

## 2020-03-05 NOTE — Transfer of Care (Signed)
Immediate Anesthesia Transfer of Care Note  Patient: Colleen [name]  Procedure(s) Performed: COLONOSCOPY WITH PROPOFOL (N/A ) POLYPECTOMY HEMOSTASIS CLIP PLACEMENT BIOPSY  Patient Location: PACU  Anesthesia Type:MAC  Level of Consciousness: awake, alert  and oriented  Airway & Oxygen Therapy: Patient Spontanous Breathing and Patient connected to face mask oxygen  Post-op Assessment: Report given to RN, Post -op Vital signs reviewed and stable and Patient moving all extremities X 4  Post vital signs: Reviewed and stable  Last Vitals:  Vitals Value Taken Time  BP    Temp    Pulse    Resp    SpO2      Last Pain:  Vitals:   03/05/20 0740  TempSrc: Oral  PainSc: 0-No pain         Complications: No apparent anesthesia complications

## 2020-03-05 NOTE — Anesthesia Preprocedure Evaluation (Addendum)
Anesthesia Evaluation  Patient identified by MRN, date of birth, ID band Patient awake    Reviewed: Allergy & Precautions, NPO status , Patient's Chart, lab work & pertinent test results  Airway Mallampati: II  TM Distance: >3 FB Neck ROM: Full    Dental  (+) Teeth Intact, Dental Advisory Given   Pulmonary neg pulmonary ROS,    Pulmonary exam normal        Cardiovascular hypertension, Pt. on medications and Pt. on home beta blockers  Rhythm:Regular Rate:Normal     Neuro/Psych negative psych ROS   GI/Hepatic negative GI ROS, Neg liver ROS,   Endo/Other  diabetes, Type 2, Insulin Dependent  Renal/GU ESRFRenal disease     Musculoskeletal negative musculoskeletal ROS (+)   Abdominal Normal abdominal exam  (+)   Peds  Hematology   Anesthesia Other Findings   Reproductive/Obstetrics                            Anesthesia Physical Anesthesia Plan  ASA: III  Anesthesia Plan: MAC   Post-op Pain Management:    Induction: Intravenous  PONV Risk Score and Plan: 0 and Propofol infusion  Airway Management Planned: Natural Airway and Simple Face Mask  Additional Equipment: None  Intra-op Plan:   Post-operative Plan:   Informed Consent:   Plan Discussed with: CRNA  Anesthesia Plan Comments:        Anesthesia Quick Evaluation

## 2020-03-05 NOTE — Anesthesia Procedure Notes (Signed)
Procedure Name: MAC Date/Time: 03/05/2020 10:07 AM Performed by: Niel Hummer, CRNA Pre-anesthesia Checklist: Patient identified, Emergency Drugs available, Suction available and Patient being monitored Oxygen Delivery Method: Simple face mask

## 2020-03-05 NOTE — Discharge Instructions (Signed)

## 2020-03-05 NOTE — Op Note (Signed)
[facility] Patient Name: Colleen [name] Procedure Date: 03/05/2020 MRN: [id number] Attending MD: Gerrit Heck , MD Date of Birth: [birth year] CSN: [id number] Age: 59 Admit Type: Outpatient Procedure:                Colonoscopy Indications:              Screening for colorectal malignant neoplasm, This                            is the patient's first colonoscopy                           History of ESRD with HD on MWF, currently                            undergoing renal transplant evaluation at [facility]. Providers:                Gerrit Heck, MD, Benetta Spar RN, RN, Corie Chiquito, Technician, Maudry Diego, CRNA Referring MD:              Medicines:                Monitored Anesthesia Care Complications:            No immediate complications. Estimated Blood Loss:     Estimated blood loss was minimal. Procedure:                Pre-Anesthesia Assessment:                           - Prior to the procedure, a History and Physical                            was performed, and patient medications and                            allergies were reviewed. The patient's tolerance of                            previous anesthesia was also reviewed. The risks                            and benefits of the procedure and the sedation                            options and risks were discussed with the patient.                            All questions were answered, and informed consent                            was obtained. Prior Anticoagulants: The patient has  taken no previous anticoagulant or antiplatelet                            agents. ASA Grade Assessment: III - A patient with                            severe systemic disease. After reviewing the risks                            and benefits, the patient was deemed in                            satisfactory condition to undergo the procedure.                            After obtaining informed consent, the colonoscope                            was passed under direct vision. Throughout the                            procedure, the patient's blood pressure, pulse, and                            oxygen saturations were monitored continuously. The                            CF-HQ190L ([id number]) Olympus colonoscope was                            introduced through the anus and advanced to the the                            terminal ileum. The colonoscopy was performed                            without difficulty. The patient tolerated the                            procedure well. The quality of the bowel                            preparation was adequate. The terminal ileum,                            ileocecal valve, appendiceal orifice, and rectum                            were photographed. Scope In: 10:13:58 AM Scope Out: 10:50:23 AM Scope Withdrawal Time: 0 hours 31 minutes 39 seconds  Total Procedure Duration: 0 hours 36 minutes 25 seconds  Findings:      The perianal and digital rectal examinations were normal.      Two sessile polyps were found in the ascending colon and cecum. The  polyps were 3 to 4 mm in size. These polyps were removed with a cold       snare. Resection and retrieval were complete. Estimated blood loss was       minimal. There was mild persistent oozing at the cecal polypectomy site.       Given oozing and location in the proximal colon, one hemostatic clip was       successfully placed for hemostasis. There was no bleeding at the end of       the procedure.      Three sessile polyps were found in the sigmoid colon (1) and descending       colon (2). The polyps were 2 to 4 mm in size. These polyps were removed       with a cold snare x2 and cold forceps x1. Resection and retrieval were       complete. Estimated blood loss was minimal.      A 2 mm polyp was found in the rectum. The polyp was sessile. The polyp        was removed with a cold biopsy forceps. Resection and retrieval were       complete. Estimated blood loss was minimal.      An area of mild melanosis was found in the ascending colon and in the       cecum. Biopsies were taken with a cold forceps for histology. Estimated       blood loss was minimal.      Non-bleeding internal hemorrhoids and hypertrophied anal papillae were       found during retroflexion. The hemorrhoids were small.      The terminal ileum appeared normal. Impression:               - Two 3 to 4 mm polyps in the ascending colon and                            in the cecum, removed with a cold snare. Resected                            and retrieved. Clip was placed.                           - Three 2 to 4 mm polyps in the sigmoid colon and                            in the descending colon, removed with a cold snare.                            Resected and retrieved.                           - One 2 mm polyp in the rectum, removed with a cold                            biopsy forceps. Resected and retrieved.                           - Melanosis in the colon. Biopsied.                           -  Non-bleeding internal hemorrhoids and                            hypertrophied anal papillae.                           - The examined portion of the ileum was normal. Moderate Sedation:      Not Applicable - Patient had care per Anesthesia. Recommendation:           - Patient has a contact number available for                            emergencies. The signs and symptoms of potential                            delayed complications were discussed with the                            patient. Return to normal activities tomorrow.                            Written discharge instructions were provided to the                            patient.                           - Resume previous diet.                           - Continue present medications.                            - Await pathology results.                           - Repeat colonoscopy in 3 - 5 years for                            surveillance based on pathology results.                           - Return to GI office PRN.                           - Use fiber, for example Citrucel, Fibercon, Konsyl                            or Metamucil.                           - Follow-up with Transplant Nephrology at [facility] as                            scheduled. Procedure Code(s):        --- Professional ---  45385, Colonoscopy, flexible; with removal of                            tumor(s), polyp(s), or other lesion(s) by snare                            technique                           45380, 60, Colonoscopy, flexible; with biopsy,                            single or multiple Diagnosis Code(s):        --- Professional ---                           Z12.11, Encounter for screening for malignant                            neoplasm of colon                           K63.5, Polyp of colon                           K62.1, Rectal polyp                           K63.89, Other specified diseases of intestine                           K64.8, Other hemorrhoids CPT copyright 2019 American Medical Association. All rights reserved. The codes documented in this report are preliminary and upon coder review may  be revised to meet current compliance requirements. Gerrit Heck, MD 03/05/2020 11:00:52 AM Number of Addenda: 0

## 2020-03-06 ENCOUNTER — Encounter: Payer: Self-pay | Admitting: Gastroenterology

## 2020-03-06 LAB — SURGICAL PATHOLOGY

## 2020-03-07 DIAGNOSIS — E1122 Type 2 diabetes mellitus with diabetic chronic kidney disease: Secondary | ICD-10-CM | POA: Diagnosis not present

## 2020-03-07 DIAGNOSIS — Z992 Dependence on renal dialysis: Secondary | ICD-10-CM | POA: Diagnosis not present

## 2020-03-07 DIAGNOSIS — N2581 Secondary hyperparathyroidism of renal origin: Secondary | ICD-10-CM | POA: Diagnosis not present

## 2020-03-07 DIAGNOSIS — N186 End stage renal disease: Secondary | ICD-10-CM | POA: Diagnosis not present

## 2020-03-09 DIAGNOSIS — N2581 Secondary hyperparathyroidism of renal origin: Secondary | ICD-10-CM | POA: Diagnosis not present

## 2020-03-09 DIAGNOSIS — Z992 Dependence on renal dialysis: Secondary | ICD-10-CM | POA: Diagnosis not present

## 2020-03-09 DIAGNOSIS — N186 End stage renal disease: Secondary | ICD-10-CM | POA: Diagnosis not present

## 2020-03-12 DIAGNOSIS — N2581 Secondary hyperparathyroidism of renal origin: Secondary | ICD-10-CM | POA: Diagnosis not present

## 2020-03-12 DIAGNOSIS — Z992 Dependence on renal dialysis: Secondary | ICD-10-CM | POA: Diagnosis not present

## 2020-03-12 DIAGNOSIS — N186 End stage renal disease: Secondary | ICD-10-CM | POA: Diagnosis not present

## 2020-03-14 DIAGNOSIS — Z992 Dependence on renal dialysis: Secondary | ICD-10-CM | POA: Diagnosis not present

## 2020-03-14 DIAGNOSIS — N186 End stage renal disease: Secondary | ICD-10-CM | POA: Diagnosis not present

## 2020-03-14 DIAGNOSIS — N2581 Secondary hyperparathyroidism of renal origin: Secondary | ICD-10-CM | POA: Diagnosis not present

## 2020-03-15 DIAGNOSIS — E11319 Type 2 diabetes mellitus with unspecified diabetic retinopathy without macular edema: Secondary | ICD-10-CM | POA: Diagnosis not present

## 2020-03-15 DIAGNOSIS — Z0000 Encounter for general adult medical examination without abnormal findings: Secondary | ICD-10-CM | POA: Diagnosis not present

## 2020-03-15 DIAGNOSIS — N186 End stage renal disease: Secondary | ICD-10-CM | POA: Diagnosis not present

## 2020-03-15 DIAGNOSIS — E1122 Type 2 diabetes mellitus with diabetic chronic kidney disease: Secondary | ICD-10-CM | POA: Diagnosis not present

## 2020-03-16 DIAGNOSIS — N186 End stage renal disease: Secondary | ICD-10-CM | POA: Diagnosis not present

## 2020-03-16 DIAGNOSIS — Z992 Dependence on renal dialysis: Secondary | ICD-10-CM | POA: Diagnosis not present

## 2020-03-16 DIAGNOSIS — N2581 Secondary hyperparathyroidism of renal origin: Secondary | ICD-10-CM | POA: Diagnosis not present

## 2020-03-19 DIAGNOSIS — N2581 Secondary hyperparathyroidism of renal origin: Secondary | ICD-10-CM | POA: Diagnosis not present

## 2020-03-19 DIAGNOSIS — N186 End stage renal disease: Secondary | ICD-10-CM | POA: Diagnosis not present

## 2020-03-19 DIAGNOSIS — Z992 Dependence on renal dialysis: Secondary | ICD-10-CM | POA: Diagnosis not present

## 2020-03-21 DIAGNOSIS — N186 End stage renal disease: Secondary | ICD-10-CM | POA: Diagnosis not present

## 2020-03-21 DIAGNOSIS — N2581 Secondary hyperparathyroidism of renal origin: Secondary | ICD-10-CM | POA: Diagnosis not present

## 2020-03-21 DIAGNOSIS — Z992 Dependence on renal dialysis: Secondary | ICD-10-CM | POA: Diagnosis not present

## 2020-03-23 DIAGNOSIS — N186 End stage renal disease: Secondary | ICD-10-CM | POA: Diagnosis not present

## 2020-03-23 DIAGNOSIS — Z992 Dependence on renal dialysis: Secondary | ICD-10-CM | POA: Diagnosis not present

## 2020-03-23 DIAGNOSIS — N2581 Secondary hyperparathyroidism of renal origin: Secondary | ICD-10-CM | POA: Diagnosis not present

## 2020-03-26 DIAGNOSIS — N2581 Secondary hyperparathyroidism of renal origin: Secondary | ICD-10-CM | POA: Diagnosis not present

## 2020-03-26 DIAGNOSIS — Z23 Encounter for immunization: Secondary | ICD-10-CM | POA: Diagnosis not present

## 2020-03-26 DIAGNOSIS — N186 End stage renal disease: Secondary | ICD-10-CM | POA: Diagnosis not present

## 2020-03-26 DIAGNOSIS — Z992 Dependence on renal dialysis: Secondary | ICD-10-CM | POA: Diagnosis not present

## 2020-03-28 DIAGNOSIS — Z23 Encounter for immunization: Secondary | ICD-10-CM | POA: Diagnosis not present

## 2020-03-28 DIAGNOSIS — N186 End stage renal disease: Secondary | ICD-10-CM | POA: Diagnosis not present

## 2020-03-28 DIAGNOSIS — Z992 Dependence on renal dialysis: Secondary | ICD-10-CM | POA: Diagnosis not present

## 2020-03-28 DIAGNOSIS — N2581 Secondary hyperparathyroidism of renal origin: Secondary | ICD-10-CM | POA: Diagnosis not present

## 2020-03-30 DIAGNOSIS — N186 End stage renal disease: Secondary | ICD-10-CM | POA: Diagnosis not present

## 2020-03-30 DIAGNOSIS — Z23 Encounter for immunization: Secondary | ICD-10-CM | POA: Diagnosis not present

## 2020-03-30 DIAGNOSIS — N2581 Secondary hyperparathyroidism of renal origin: Secondary | ICD-10-CM | POA: Diagnosis not present

## 2020-03-30 DIAGNOSIS — Z992 Dependence on renal dialysis: Secondary | ICD-10-CM | POA: Diagnosis not present

## 2020-04-02 DIAGNOSIS — N2581 Secondary hyperparathyroidism of renal origin: Secondary | ICD-10-CM | POA: Diagnosis not present

## 2020-04-02 DIAGNOSIS — N186 End stage renal disease: Secondary | ICD-10-CM | POA: Diagnosis not present

## 2020-04-02 DIAGNOSIS — Z992 Dependence on renal dialysis: Secondary | ICD-10-CM | POA: Diagnosis not present

## 2020-04-04 DIAGNOSIS — Z992 Dependence on renal dialysis: Secondary | ICD-10-CM | POA: Diagnosis not present

## 2020-04-04 DIAGNOSIS — N2581 Secondary hyperparathyroidism of renal origin: Secondary | ICD-10-CM | POA: Diagnosis not present

## 2020-04-04 DIAGNOSIS — N186 End stage renal disease: Secondary | ICD-10-CM | POA: Diagnosis not present

## 2020-04-06 DIAGNOSIS — Z992 Dependence on renal dialysis: Secondary | ICD-10-CM | POA: Diagnosis not present

## 2020-04-06 DIAGNOSIS — N186 End stage renal disease: Secondary | ICD-10-CM | POA: Diagnosis not present

## 2020-04-06 DIAGNOSIS — E1122 Type 2 diabetes mellitus with diabetic chronic kidney disease: Secondary | ICD-10-CM | POA: Diagnosis not present

## 2020-04-06 DIAGNOSIS — N2581 Secondary hyperparathyroidism of renal origin: Secondary | ICD-10-CM | POA: Diagnosis not present

## 2020-04-09 DIAGNOSIS — Z01818 Encounter for other preprocedural examination: Secondary | ICD-10-CM | POA: Diagnosis not present

## 2020-04-09 DIAGNOSIS — K083 Retained dental root: Secondary | ICD-10-CM | POA: Diagnosis not present

## 2020-04-09 DIAGNOSIS — K056 Periodontal disease, unspecified: Secondary | ICD-10-CM | POA: Diagnosis not present

## 2020-04-10 DIAGNOSIS — Z992 Dependence on renal dialysis: Secondary | ICD-10-CM | POA: Diagnosis not present

## 2020-04-10 DIAGNOSIS — N2581 Secondary hyperparathyroidism of renal origin: Secondary | ICD-10-CM | POA: Diagnosis not present

## 2020-04-10 DIAGNOSIS — N186 End stage renal disease: Secondary | ICD-10-CM | POA: Diagnosis not present

## 2020-04-11 DIAGNOSIS — N186 End stage renal disease: Secondary | ICD-10-CM | POA: Diagnosis not present

## 2020-04-11 DIAGNOSIS — N2581 Secondary hyperparathyroidism of renal origin: Secondary | ICD-10-CM | POA: Diagnosis not present

## 2020-04-11 DIAGNOSIS — Z992 Dependence on renal dialysis: Secondary | ICD-10-CM | POA: Diagnosis not present

## 2020-04-13 DIAGNOSIS — Z992 Dependence on renal dialysis: Secondary | ICD-10-CM | POA: Diagnosis not present

## 2020-04-13 DIAGNOSIS — N186 End stage renal disease: Secondary | ICD-10-CM | POA: Diagnosis not present

## 2020-04-13 DIAGNOSIS — N2581 Secondary hyperparathyroidism of renal origin: Secondary | ICD-10-CM | POA: Diagnosis not present

## 2020-04-16 DIAGNOSIS — N186 End stage renal disease: Secondary | ICD-10-CM | POA: Diagnosis not present

## 2020-04-16 DIAGNOSIS — N2581 Secondary hyperparathyroidism of renal origin: Secondary | ICD-10-CM | POA: Diagnosis not present

## 2020-04-16 DIAGNOSIS — Z992 Dependence on renal dialysis: Secondary | ICD-10-CM | POA: Diagnosis not present

## 2020-04-18 DIAGNOSIS — Z992 Dependence on renal dialysis: Secondary | ICD-10-CM | POA: Diagnosis not present

## 2020-04-18 DIAGNOSIS — N2581 Secondary hyperparathyroidism of renal origin: Secondary | ICD-10-CM | POA: Diagnosis not present

## 2020-04-18 DIAGNOSIS — N186 End stage renal disease: Secondary | ICD-10-CM | POA: Diagnosis not present

## 2020-04-20 DIAGNOSIS — Z992 Dependence on renal dialysis: Secondary | ICD-10-CM | POA: Diagnosis not present

## 2020-04-20 DIAGNOSIS — N2581 Secondary hyperparathyroidism of renal origin: Secondary | ICD-10-CM | POA: Diagnosis not present

## 2020-04-20 DIAGNOSIS — N186 End stage renal disease: Secondary | ICD-10-CM | POA: Diagnosis not present

## 2020-04-23 DIAGNOSIS — Z992 Dependence on renal dialysis: Secondary | ICD-10-CM | POA: Diagnosis not present

## 2020-04-23 DIAGNOSIS — N186 End stage renal disease: Secondary | ICD-10-CM | POA: Diagnosis not present

## 2020-04-23 DIAGNOSIS — N2581 Secondary hyperparathyroidism of renal origin: Secondary | ICD-10-CM | POA: Diagnosis not present

## 2020-04-25 DIAGNOSIS — N186 End stage renal disease: Secondary | ICD-10-CM | POA: Diagnosis not present

## 2020-04-25 DIAGNOSIS — Z992 Dependence on renal dialysis: Secondary | ICD-10-CM | POA: Diagnosis not present

## 2020-04-25 DIAGNOSIS — N2581 Secondary hyperparathyroidism of renal origin: Secondary | ICD-10-CM | POA: Diagnosis not present

## 2020-04-26 DIAGNOSIS — N186 End stage renal disease: Secondary | ICD-10-CM | POA: Diagnosis not present

## 2020-04-26 DIAGNOSIS — K0510 Chronic gingivitis, plaque induced: Secondary | ICD-10-CM | POA: Diagnosis not present

## 2020-04-26 DIAGNOSIS — Z992 Dependence on renal dialysis: Secondary | ICD-10-CM | POA: Diagnosis not present

## 2020-04-26 DIAGNOSIS — K029 Dental caries, unspecified: Secondary | ICD-10-CM | POA: Diagnosis not present

## 2020-04-26 DIAGNOSIS — Z01818 Encounter for other preprocedural examination: Secondary | ICD-10-CM | POA: Diagnosis not present

## 2020-04-27 DIAGNOSIS — N186 End stage renal disease: Secondary | ICD-10-CM | POA: Diagnosis not present

## 2020-04-27 DIAGNOSIS — Z992 Dependence on renal dialysis: Secondary | ICD-10-CM | POA: Diagnosis not present

## 2020-04-27 DIAGNOSIS — N2581 Secondary hyperparathyroidism of renal origin: Secondary | ICD-10-CM | POA: Diagnosis not present

## 2020-04-30 DIAGNOSIS — Z992 Dependence on renal dialysis: Secondary | ICD-10-CM | POA: Diagnosis not present

## 2020-04-30 DIAGNOSIS — N186 End stage renal disease: Secondary | ICD-10-CM | POA: Diagnosis not present

## 2020-04-30 DIAGNOSIS — N2581 Secondary hyperparathyroidism of renal origin: Secondary | ICD-10-CM | POA: Diagnosis not present

## 2020-05-02 DIAGNOSIS — N2581 Secondary hyperparathyroidism of renal origin: Secondary | ICD-10-CM | POA: Diagnosis not present

## 2020-05-02 DIAGNOSIS — N186 End stage renal disease: Secondary | ICD-10-CM | POA: Diagnosis not present

## 2020-05-02 DIAGNOSIS — Z992 Dependence on renal dialysis: Secondary | ICD-10-CM | POA: Diagnosis not present

## 2020-05-04 DIAGNOSIS — Z992 Dependence on renal dialysis: Secondary | ICD-10-CM | POA: Diagnosis not present

## 2020-05-04 DIAGNOSIS — N186 End stage renal disease: Secondary | ICD-10-CM | POA: Diagnosis not present

## 2020-05-04 DIAGNOSIS — N2581 Secondary hyperparathyroidism of renal origin: Secondary | ICD-10-CM | POA: Diagnosis not present

## 2020-05-07 DIAGNOSIS — E1122 Type 2 diabetes mellitus with diabetic chronic kidney disease: Secondary | ICD-10-CM | POA: Diagnosis not present

## 2020-05-07 DIAGNOSIS — Z992 Dependence on renal dialysis: Secondary | ICD-10-CM | POA: Diagnosis not present

## 2020-05-07 DIAGNOSIS — N186 End stage renal disease: Secondary | ICD-10-CM | POA: Diagnosis not present

## 2020-05-07 DIAGNOSIS — N2581 Secondary hyperparathyroidism of renal origin: Secondary | ICD-10-CM | POA: Diagnosis not present

## 2020-05-09 DIAGNOSIS — N2581 Secondary hyperparathyroidism of renal origin: Secondary | ICD-10-CM | POA: Diagnosis not present

## 2020-05-09 DIAGNOSIS — Z992 Dependence on renal dialysis: Secondary | ICD-10-CM | POA: Diagnosis not present

## 2020-05-09 DIAGNOSIS — N186 End stage renal disease: Secondary | ICD-10-CM | POA: Diagnosis not present

## 2020-05-10 DIAGNOSIS — K080 Exfoliation of teeth due to systemic causes: Secondary | ICD-10-CM | POA: Diagnosis not present

## 2020-05-10 DIAGNOSIS — Z01818 Encounter for other preprocedural examination: Secondary | ICD-10-CM | POA: Diagnosis not present

## 2020-05-10 DIAGNOSIS — K05313 Chronic periodontitis, localized, severe: Secondary | ICD-10-CM | POA: Diagnosis not present

## 2020-05-10 DIAGNOSIS — Z992 Dependence on renal dialysis: Secondary | ICD-10-CM | POA: Diagnosis not present

## 2020-05-10 DIAGNOSIS — N186 End stage renal disease: Secondary | ICD-10-CM | POA: Diagnosis not present

## 2020-05-11 DIAGNOSIS — N2581 Secondary hyperparathyroidism of renal origin: Secondary | ICD-10-CM | POA: Diagnosis not present

## 2020-05-11 DIAGNOSIS — N186 End stage renal disease: Secondary | ICD-10-CM | POA: Diagnosis not present

## 2020-05-11 DIAGNOSIS — Z992 Dependence on renal dialysis: Secondary | ICD-10-CM | POA: Diagnosis not present

## 2020-05-14 DIAGNOSIS — Z992 Dependence on renal dialysis: Secondary | ICD-10-CM | POA: Diagnosis not present

## 2020-05-14 DIAGNOSIS — N2581 Secondary hyperparathyroidism of renal origin: Secondary | ICD-10-CM | POA: Diagnosis not present

## 2020-05-14 DIAGNOSIS — N186 End stage renal disease: Secondary | ICD-10-CM | POA: Diagnosis not present

## 2020-05-15 DIAGNOSIS — Z7682 Awaiting organ transplant status: Secondary | ICD-10-CM | POA: Diagnosis not present

## 2020-05-15 DIAGNOSIS — I82402 Acute embolism and thrombosis of unspecified deep veins of left lower extremity: Secondary | ICD-10-CM | POA: Diagnosis not present

## 2020-05-15 DIAGNOSIS — E11649 Type 2 diabetes mellitus with hypoglycemia without coma: Secondary | ICD-10-CM | POA: Diagnosis not present

## 2020-05-15 DIAGNOSIS — N186 End stage renal disease: Secondary | ICD-10-CM | POA: Diagnosis not present

## 2020-05-15 DIAGNOSIS — Z992 Dependence on renal dialysis: Secondary | ICD-10-CM | POA: Diagnosis not present

## 2020-05-16 DIAGNOSIS — N186 End stage renal disease: Secondary | ICD-10-CM | POA: Diagnosis not present

## 2020-05-16 DIAGNOSIS — N2581 Secondary hyperparathyroidism of renal origin: Secondary | ICD-10-CM | POA: Diagnosis not present

## 2020-05-16 DIAGNOSIS — Z992 Dependence on renal dialysis: Secondary | ICD-10-CM | POA: Diagnosis not present

## 2020-05-18 DIAGNOSIS — N186 End stage renal disease: Secondary | ICD-10-CM | POA: Diagnosis not present

## 2020-05-18 DIAGNOSIS — Z992 Dependence on renal dialysis: Secondary | ICD-10-CM | POA: Diagnosis not present

## 2020-05-18 DIAGNOSIS — N2581 Secondary hyperparathyroidism of renal origin: Secondary | ICD-10-CM | POA: Diagnosis not present

## 2020-05-21 DIAGNOSIS — N2581 Secondary hyperparathyroidism of renal origin: Secondary | ICD-10-CM | POA: Diagnosis not present

## 2020-05-21 DIAGNOSIS — N186 End stage renal disease: Secondary | ICD-10-CM | POA: Diagnosis not present

## 2020-05-21 DIAGNOSIS — Z992 Dependence on renal dialysis: Secondary | ICD-10-CM | POA: Diagnosis not present

## 2020-05-23 DIAGNOSIS — Z992 Dependence on renal dialysis: Secondary | ICD-10-CM | POA: Diagnosis not present

## 2020-05-23 DIAGNOSIS — N2581 Secondary hyperparathyroidism of renal origin: Secondary | ICD-10-CM | POA: Diagnosis not present

## 2020-05-23 DIAGNOSIS — N186 End stage renal disease: Secondary | ICD-10-CM | POA: Diagnosis not present

## 2020-05-24 DIAGNOSIS — K08409 Partial loss of teeth, unspecified cause, unspecified class: Secondary | ICD-10-CM | POA: Diagnosis not present

## 2020-05-24 DIAGNOSIS — Z01818 Encounter for other preprocedural examination: Secondary | ICD-10-CM | POA: Diagnosis not present

## 2020-05-25 DIAGNOSIS — N2581 Secondary hyperparathyroidism of renal origin: Secondary | ICD-10-CM | POA: Diagnosis not present

## 2020-05-25 DIAGNOSIS — N186 End stage renal disease: Secondary | ICD-10-CM | POA: Diagnosis not present

## 2020-05-25 DIAGNOSIS — Z992 Dependence on renal dialysis: Secondary | ICD-10-CM | POA: Diagnosis not present

## 2020-05-28 DIAGNOSIS — N186 End stage renal disease: Secondary | ICD-10-CM | POA: Diagnosis not present

## 2020-05-28 DIAGNOSIS — Z992 Dependence on renal dialysis: Secondary | ICD-10-CM | POA: Diagnosis not present

## 2020-05-28 DIAGNOSIS — N2581 Secondary hyperparathyroidism of renal origin: Secondary | ICD-10-CM | POA: Diagnosis not present

## 2020-05-30 DIAGNOSIS — N186 End stage renal disease: Secondary | ICD-10-CM | POA: Diagnosis not present

## 2020-05-30 DIAGNOSIS — Z992 Dependence on renal dialysis: Secondary | ICD-10-CM | POA: Diagnosis not present

## 2020-05-30 DIAGNOSIS — N2581 Secondary hyperparathyroidism of renal origin: Secondary | ICD-10-CM | POA: Diagnosis not present

## 2020-06-01 DIAGNOSIS — Z992 Dependence on renal dialysis: Secondary | ICD-10-CM | POA: Diagnosis not present

## 2020-06-01 DIAGNOSIS — N2581 Secondary hyperparathyroidism of renal origin: Secondary | ICD-10-CM | POA: Diagnosis not present

## 2020-06-01 DIAGNOSIS — N186 End stage renal disease: Secondary | ICD-10-CM | POA: Diagnosis not present

## 2020-06-04 DIAGNOSIS — Z992 Dependence on renal dialysis: Secondary | ICD-10-CM | POA: Diagnosis not present

## 2020-06-04 DIAGNOSIS — N2581 Secondary hyperparathyroidism of renal origin: Secondary | ICD-10-CM | POA: Diagnosis not present

## 2020-06-04 DIAGNOSIS — N186 End stage renal disease: Secondary | ICD-10-CM | POA: Diagnosis not present

## 2020-06-06 DIAGNOSIS — E1122 Type 2 diabetes mellitus with diabetic chronic kidney disease: Secondary | ICD-10-CM | POA: Diagnosis not present

## 2020-06-06 DIAGNOSIS — Z992 Dependence on renal dialysis: Secondary | ICD-10-CM | POA: Diagnosis not present

## 2020-06-06 DIAGNOSIS — N186 End stage renal disease: Secondary | ICD-10-CM | POA: Diagnosis not present

## 2020-06-07 DIAGNOSIS — J3489 Other specified disorders of nose and nasal sinuses: Secondary | ICD-10-CM | POA: Diagnosis not present

## 2020-06-07 DIAGNOSIS — Z992 Dependence on renal dialysis: Secondary | ICD-10-CM | POA: Diagnosis not present

## 2020-06-07 DIAGNOSIS — N186 End stage renal disease: Secondary | ICD-10-CM | POA: Diagnosis not present

## 2020-06-07 DIAGNOSIS — Z01818 Encounter for other preprocedural examination: Secondary | ICD-10-CM | POA: Diagnosis not present

## 2020-06-08 DIAGNOSIS — N186 End stage renal disease: Secondary | ICD-10-CM | POA: Diagnosis not present

## 2020-06-08 DIAGNOSIS — N2581 Secondary hyperparathyroidism of renal origin: Secondary | ICD-10-CM | POA: Diagnosis not present

## 2020-06-08 DIAGNOSIS — Z992 Dependence on renal dialysis: Secondary | ICD-10-CM | POA: Diagnosis not present

## 2020-06-11 DIAGNOSIS — N2581 Secondary hyperparathyroidism of renal origin: Secondary | ICD-10-CM | POA: Diagnosis not present

## 2020-06-11 DIAGNOSIS — N186 End stage renal disease: Secondary | ICD-10-CM | POA: Diagnosis not present

## 2020-06-11 DIAGNOSIS — Z992 Dependence on renal dialysis: Secondary | ICD-10-CM | POA: Diagnosis not present

## 2020-06-13 DIAGNOSIS — N186 End stage renal disease: Secondary | ICD-10-CM | POA: Diagnosis not present

## 2020-06-13 DIAGNOSIS — N2581 Secondary hyperparathyroidism of renal origin: Secondary | ICD-10-CM | POA: Diagnosis not present

## 2020-06-13 DIAGNOSIS — Z992 Dependence on renal dialysis: Secondary | ICD-10-CM | POA: Diagnosis not present

## 2020-06-15 DIAGNOSIS — N186 End stage renal disease: Secondary | ICD-10-CM | POA: Diagnosis not present

## 2020-06-15 DIAGNOSIS — N2581 Secondary hyperparathyroidism of renal origin: Secondary | ICD-10-CM | POA: Diagnosis not present

## 2020-06-15 DIAGNOSIS — Z992 Dependence on renal dialysis: Secondary | ICD-10-CM | POA: Diagnosis not present

## 2020-06-18 DIAGNOSIS — Z992 Dependence on renal dialysis: Secondary | ICD-10-CM | POA: Diagnosis not present

## 2020-06-18 DIAGNOSIS — N186 End stage renal disease: Secondary | ICD-10-CM | POA: Diagnosis not present

## 2020-06-18 DIAGNOSIS — N2581 Secondary hyperparathyroidism of renal origin: Secondary | ICD-10-CM | POA: Diagnosis not present

## 2020-06-19 DIAGNOSIS — E113593 Type 2 diabetes mellitus with proliferative diabetic retinopathy without macular edema, bilateral: Secondary | ICD-10-CM | POA: Diagnosis not present

## 2020-06-19 DIAGNOSIS — H3582 Retinal ischemia: Secondary | ICD-10-CM | POA: Diagnosis not present

## 2020-06-19 DIAGNOSIS — H18422 Band keratopathy, left eye: Secondary | ICD-10-CM | POA: Diagnosis not present

## 2020-06-20 DIAGNOSIS — N186 End stage renal disease: Secondary | ICD-10-CM | POA: Diagnosis not present

## 2020-06-20 DIAGNOSIS — Z992 Dependence on renal dialysis: Secondary | ICD-10-CM | POA: Diagnosis not present

## 2020-06-20 DIAGNOSIS — N2581 Secondary hyperparathyroidism of renal origin: Secondary | ICD-10-CM | POA: Diagnosis not present

## 2020-06-22 DIAGNOSIS — Z992 Dependence on renal dialysis: Secondary | ICD-10-CM | POA: Diagnosis not present

## 2020-06-22 DIAGNOSIS — N186 End stage renal disease: Secondary | ICD-10-CM | POA: Diagnosis not present

## 2020-06-22 DIAGNOSIS — N2581 Secondary hyperparathyroidism of renal origin: Secondary | ICD-10-CM | POA: Diagnosis not present

## 2020-06-25 DIAGNOSIS — N186 End stage renal disease: Secondary | ICD-10-CM | POA: Diagnosis not present

## 2020-06-25 DIAGNOSIS — N2581 Secondary hyperparathyroidism of renal origin: Secondary | ICD-10-CM | POA: Diagnosis not present

## 2020-06-25 DIAGNOSIS — Z992 Dependence on renal dialysis: Secondary | ICD-10-CM | POA: Diagnosis not present

## 2020-06-27 DIAGNOSIS — Z992 Dependence on renal dialysis: Secondary | ICD-10-CM | POA: Diagnosis not present

## 2020-06-27 DIAGNOSIS — N186 End stage renal disease: Secondary | ICD-10-CM | POA: Diagnosis not present

## 2020-06-27 DIAGNOSIS — N2581 Secondary hyperparathyroidism of renal origin: Secondary | ICD-10-CM | POA: Diagnosis not present

## 2020-06-29 DIAGNOSIS — N186 End stage renal disease: Secondary | ICD-10-CM | POA: Diagnosis not present

## 2020-06-29 DIAGNOSIS — Z992 Dependence on renal dialysis: Secondary | ICD-10-CM | POA: Diagnosis not present

## 2020-06-29 DIAGNOSIS — N2581 Secondary hyperparathyroidism of renal origin: Secondary | ICD-10-CM | POA: Diagnosis not present

## 2020-07-02 DIAGNOSIS — N2581 Secondary hyperparathyroidism of renal origin: Secondary | ICD-10-CM | POA: Diagnosis not present

## 2020-07-02 DIAGNOSIS — Z992 Dependence on renal dialysis: Secondary | ICD-10-CM | POA: Diagnosis not present

## 2020-07-02 DIAGNOSIS — N186 End stage renal disease: Secondary | ICD-10-CM | POA: Diagnosis not present

## 2020-07-04 DIAGNOSIS — N2581 Secondary hyperparathyroidism of renal origin: Secondary | ICD-10-CM | POA: Diagnosis not present

## 2020-07-04 DIAGNOSIS — N186 End stage renal disease: Secondary | ICD-10-CM | POA: Diagnosis not present

## 2020-07-04 DIAGNOSIS — Z992 Dependence on renal dialysis: Secondary | ICD-10-CM | POA: Diagnosis not present

## 2020-07-06 DIAGNOSIS — N186 End stage renal disease: Secondary | ICD-10-CM | POA: Diagnosis not present

## 2020-07-06 DIAGNOSIS — N2581 Secondary hyperparathyroidism of renal origin: Secondary | ICD-10-CM | POA: Diagnosis not present

## 2020-07-06 DIAGNOSIS — Z992 Dependence on renal dialysis: Secondary | ICD-10-CM | POA: Diagnosis not present

## 2020-07-07 DIAGNOSIS — E1122 Type 2 diabetes mellitus with diabetic chronic kidney disease: Secondary | ICD-10-CM | POA: Diagnosis not present

## 2020-07-07 DIAGNOSIS — Z992 Dependence on renal dialysis: Secondary | ICD-10-CM | POA: Diagnosis not present

## 2020-07-07 DIAGNOSIS — N186 End stage renal disease: Secondary | ICD-10-CM | POA: Diagnosis not present

## 2020-07-09 DIAGNOSIS — Z992 Dependence on renal dialysis: Secondary | ICD-10-CM | POA: Diagnosis not present

## 2020-07-09 DIAGNOSIS — N186 End stage renal disease: Secondary | ICD-10-CM | POA: Diagnosis not present

## 2020-07-09 DIAGNOSIS — N2581 Secondary hyperparathyroidism of renal origin: Secondary | ICD-10-CM | POA: Diagnosis not present

## 2020-07-11 DIAGNOSIS — Z992 Dependence on renal dialysis: Secondary | ICD-10-CM | POA: Diagnosis not present

## 2020-07-11 DIAGNOSIS — N2581 Secondary hyperparathyroidism of renal origin: Secondary | ICD-10-CM | POA: Diagnosis not present

## 2020-07-11 DIAGNOSIS — N186 End stage renal disease: Secondary | ICD-10-CM | POA: Diagnosis not present

## 2020-07-13 DIAGNOSIS — N2581 Secondary hyperparathyroidism of renal origin: Secondary | ICD-10-CM | POA: Diagnosis not present

## 2020-07-13 DIAGNOSIS — Z992 Dependence on renal dialysis: Secondary | ICD-10-CM | POA: Diagnosis not present

## 2020-07-13 DIAGNOSIS — N186 End stage renal disease: Secondary | ICD-10-CM | POA: Diagnosis not present

## 2020-07-16 DIAGNOSIS — N2581 Secondary hyperparathyroidism of renal origin: Secondary | ICD-10-CM | POA: Diagnosis not present

## 2020-07-16 DIAGNOSIS — N186 End stage renal disease: Secondary | ICD-10-CM | POA: Diagnosis not present

## 2020-07-16 DIAGNOSIS — Z992 Dependence on renal dialysis: Secondary | ICD-10-CM | POA: Diagnosis not present

## 2020-07-18 DIAGNOSIS — N2581 Secondary hyperparathyroidism of renal origin: Secondary | ICD-10-CM | POA: Diagnosis not present

## 2020-07-18 DIAGNOSIS — Z992 Dependence on renal dialysis: Secondary | ICD-10-CM | POA: Diagnosis not present

## 2020-07-18 DIAGNOSIS — N186 End stage renal disease: Secondary | ICD-10-CM | POA: Diagnosis not present

## 2020-07-21 DIAGNOSIS — N2581 Secondary hyperparathyroidism of renal origin: Secondary | ICD-10-CM | POA: Diagnosis not present

## 2020-07-21 DIAGNOSIS — N186 End stage renal disease: Secondary | ICD-10-CM | POA: Diagnosis not present

## 2020-07-21 DIAGNOSIS — Z992 Dependence on renal dialysis: Secondary | ICD-10-CM | POA: Diagnosis not present

## 2020-07-23 DIAGNOSIS — N186 End stage renal disease: Secondary | ICD-10-CM | POA: Diagnosis not present

## 2020-07-23 DIAGNOSIS — Z992 Dependence on renal dialysis: Secondary | ICD-10-CM | POA: Diagnosis not present

## 2020-07-23 DIAGNOSIS — N2581 Secondary hyperparathyroidism of renal origin: Secondary | ICD-10-CM | POA: Diagnosis not present

## 2020-07-25 DIAGNOSIS — N2581 Secondary hyperparathyroidism of renal origin: Secondary | ICD-10-CM | POA: Diagnosis not present

## 2020-07-25 DIAGNOSIS — N186 End stage renal disease: Secondary | ICD-10-CM | POA: Diagnosis not present

## 2020-07-25 DIAGNOSIS — Z992 Dependence on renal dialysis: Secondary | ICD-10-CM | POA: Diagnosis not present

## 2020-07-27 DIAGNOSIS — N186 End stage renal disease: Secondary | ICD-10-CM | POA: Diagnosis not present

## 2020-07-27 DIAGNOSIS — Z992 Dependence on renal dialysis: Secondary | ICD-10-CM | POA: Diagnosis not present

## 2020-07-27 DIAGNOSIS — N2581 Secondary hyperparathyroidism of renal origin: Secondary | ICD-10-CM | POA: Diagnosis not present

## 2020-07-30 DIAGNOSIS — N186 End stage renal disease: Secondary | ICD-10-CM | POA: Diagnosis not present

## 2020-07-30 DIAGNOSIS — N2581 Secondary hyperparathyroidism of renal origin: Secondary | ICD-10-CM | POA: Diagnosis not present

## 2020-07-30 DIAGNOSIS — Z992 Dependence on renal dialysis: Secondary | ICD-10-CM | POA: Diagnosis not present

## 2020-08-01 DIAGNOSIS — N186 End stage renal disease: Secondary | ICD-10-CM | POA: Diagnosis not present

## 2020-08-01 DIAGNOSIS — N2581 Secondary hyperparathyroidism of renal origin: Secondary | ICD-10-CM | POA: Diagnosis not present

## 2020-08-01 DIAGNOSIS — Z992 Dependence on renal dialysis: Secondary | ICD-10-CM | POA: Diagnosis not present

## 2020-08-02 DIAGNOSIS — T82858A Stenosis of vascular prosthetic devices, implants and grafts, initial encounter: Secondary | ICD-10-CM | POA: Diagnosis not present

## 2020-08-02 DIAGNOSIS — I871 Compression of vein: Secondary | ICD-10-CM | POA: Diagnosis not present

## 2020-08-02 DIAGNOSIS — Z992 Dependence on renal dialysis: Secondary | ICD-10-CM | POA: Diagnosis not present

## 2020-08-02 DIAGNOSIS — N186 End stage renal disease: Secondary | ICD-10-CM | POA: Diagnosis not present

## 2020-08-03 DIAGNOSIS — N186 End stage renal disease: Secondary | ICD-10-CM | POA: Diagnosis not present

## 2020-08-03 DIAGNOSIS — Z992 Dependence on renal dialysis: Secondary | ICD-10-CM | POA: Diagnosis not present

## 2020-08-03 DIAGNOSIS — N2581 Secondary hyperparathyroidism of renal origin: Secondary | ICD-10-CM | POA: Diagnosis not present

## 2020-08-06 DIAGNOSIS — N186 End stage renal disease: Secondary | ICD-10-CM | POA: Diagnosis not present

## 2020-08-06 DIAGNOSIS — N2581 Secondary hyperparathyroidism of renal origin: Secondary | ICD-10-CM | POA: Diagnosis not present

## 2020-08-06 DIAGNOSIS — Z992 Dependence on renal dialysis: Secondary | ICD-10-CM | POA: Diagnosis not present

## 2020-08-07 DIAGNOSIS — R079 Chest pain, unspecified: Secondary | ICD-10-CM | POA: Diagnosis not present

## 2020-08-07 DIAGNOSIS — R040 Epistaxis: Secondary | ICD-10-CM | POA: Diagnosis not present

## 2020-08-07 DIAGNOSIS — N186 End stage renal disease: Secondary | ICD-10-CM | POA: Diagnosis not present

## 2020-08-07 DIAGNOSIS — R0981 Nasal congestion: Secondary | ICD-10-CM | POA: Diagnosis not present

## 2020-08-07 DIAGNOSIS — Z992 Dependence on renal dialysis: Secondary | ICD-10-CM | POA: Diagnosis not present

## 2020-08-07 DIAGNOSIS — E1122 Type 2 diabetes mellitus with diabetic chronic kidney disease: Secondary | ICD-10-CM | POA: Diagnosis not present

## 2020-08-10 DIAGNOSIS — N2581 Secondary hyperparathyroidism of renal origin: Secondary | ICD-10-CM | POA: Diagnosis not present

## 2020-08-10 DIAGNOSIS — Z992 Dependence on renal dialysis: Secondary | ICD-10-CM | POA: Diagnosis not present

## 2020-08-10 DIAGNOSIS — N186 End stage renal disease: Secondary | ICD-10-CM | POA: Diagnosis not present

## 2020-08-13 DIAGNOSIS — N2581 Secondary hyperparathyroidism of renal origin: Secondary | ICD-10-CM | POA: Diagnosis not present

## 2020-08-13 DIAGNOSIS — N186 End stage renal disease: Secondary | ICD-10-CM | POA: Diagnosis not present

## 2020-08-13 DIAGNOSIS — Z992 Dependence on renal dialysis: Secondary | ICD-10-CM | POA: Diagnosis not present

## 2020-08-15 DIAGNOSIS — N2581 Secondary hyperparathyroidism of renal origin: Secondary | ICD-10-CM | POA: Diagnosis not present

## 2020-08-15 DIAGNOSIS — Z992 Dependence on renal dialysis: Secondary | ICD-10-CM | POA: Diagnosis not present

## 2020-08-15 DIAGNOSIS — N186 End stage renal disease: Secondary | ICD-10-CM | POA: Diagnosis not present

## 2020-08-17 DIAGNOSIS — N2581 Secondary hyperparathyroidism of renal origin: Secondary | ICD-10-CM | POA: Diagnosis not present

## 2020-08-17 DIAGNOSIS — Z992 Dependence on renal dialysis: Secondary | ICD-10-CM | POA: Diagnosis not present

## 2020-08-17 DIAGNOSIS — N186 End stage renal disease: Secondary | ICD-10-CM | POA: Diagnosis not present

## 2020-08-20 DIAGNOSIS — N2581 Secondary hyperparathyroidism of renal origin: Secondary | ICD-10-CM | POA: Diagnosis not present

## 2020-08-20 DIAGNOSIS — N186 End stage renal disease: Secondary | ICD-10-CM | POA: Diagnosis not present

## 2020-08-20 DIAGNOSIS — Z992 Dependence on renal dialysis: Secondary | ICD-10-CM | POA: Diagnosis not present

## 2020-08-22 DIAGNOSIS — N2581 Secondary hyperparathyroidism of renal origin: Secondary | ICD-10-CM | POA: Diagnosis not present

## 2020-08-22 DIAGNOSIS — N186 End stage renal disease: Secondary | ICD-10-CM | POA: Diagnosis not present

## 2020-08-22 DIAGNOSIS — Z992 Dependence on renal dialysis: Secondary | ICD-10-CM | POA: Diagnosis not present

## 2020-08-24 DIAGNOSIS — Z23 Encounter for immunization: Secondary | ICD-10-CM | POA: Diagnosis not present

## 2020-08-24 DIAGNOSIS — N2581 Secondary hyperparathyroidism of renal origin: Secondary | ICD-10-CM | POA: Diagnosis not present

## 2020-08-24 DIAGNOSIS — Z992 Dependence on renal dialysis: Secondary | ICD-10-CM | POA: Diagnosis not present

## 2020-08-24 DIAGNOSIS — N186 End stage renal disease: Secondary | ICD-10-CM | POA: Diagnosis not present

## 2020-08-27 DIAGNOSIS — Z992 Dependence on renal dialysis: Secondary | ICD-10-CM | POA: Diagnosis not present

## 2020-08-27 DIAGNOSIS — N186 End stage renal disease: Secondary | ICD-10-CM | POA: Diagnosis not present

## 2020-08-27 DIAGNOSIS — N2581 Secondary hyperparathyroidism of renal origin: Secondary | ICD-10-CM | POA: Diagnosis not present

## 2020-08-29 DIAGNOSIS — Z992 Dependence on renal dialysis: Secondary | ICD-10-CM | POA: Diagnosis not present

## 2020-08-29 DIAGNOSIS — N2581 Secondary hyperparathyroidism of renal origin: Secondary | ICD-10-CM | POA: Diagnosis not present

## 2020-08-29 DIAGNOSIS — N186 End stage renal disease: Secondary | ICD-10-CM | POA: Diagnosis not present

## 2020-08-31 DIAGNOSIS — N2581 Secondary hyperparathyroidism of renal origin: Secondary | ICD-10-CM | POA: Diagnosis not present

## 2020-08-31 DIAGNOSIS — N186 End stage renal disease: Secondary | ICD-10-CM | POA: Diagnosis not present

## 2020-08-31 DIAGNOSIS — Z992 Dependence on renal dialysis: Secondary | ICD-10-CM | POA: Diagnosis not present

## 2020-09-03 DIAGNOSIS — N2581 Secondary hyperparathyroidism of renal origin: Secondary | ICD-10-CM | POA: Diagnosis not present

## 2020-09-03 DIAGNOSIS — Z992 Dependence on renal dialysis: Secondary | ICD-10-CM | POA: Diagnosis not present

## 2020-09-03 DIAGNOSIS — N186 End stage renal disease: Secondary | ICD-10-CM | POA: Diagnosis not present

## 2020-09-05 DIAGNOSIS — Z992 Dependence on renal dialysis: Secondary | ICD-10-CM | POA: Diagnosis not present

## 2020-09-05 DIAGNOSIS — N186 End stage renal disease: Secondary | ICD-10-CM | POA: Diagnosis not present

## 2020-09-05 DIAGNOSIS — N2581 Secondary hyperparathyroidism of renal origin: Secondary | ICD-10-CM | POA: Diagnosis not present

## 2020-09-06 DIAGNOSIS — N186 End stage renal disease: Secondary | ICD-10-CM | POA: Diagnosis not present

## 2020-09-06 DIAGNOSIS — E1122 Type 2 diabetes mellitus with diabetic chronic kidney disease: Secondary | ICD-10-CM | POA: Diagnosis not present

## 2020-09-06 DIAGNOSIS — Z992 Dependence on renal dialysis: Secondary | ICD-10-CM | POA: Diagnosis not present

## 2020-09-07 DIAGNOSIS — N186 End stage renal disease: Secondary | ICD-10-CM | POA: Diagnosis not present

## 2020-09-07 DIAGNOSIS — Z992 Dependence on renal dialysis: Secondary | ICD-10-CM | POA: Diagnosis not present

## 2020-09-07 DIAGNOSIS — N2581 Secondary hyperparathyroidism of renal origin: Secondary | ICD-10-CM | POA: Diagnosis not present

## 2020-09-10 DIAGNOSIS — Z7682 Awaiting organ transplant status: Secondary | ICD-10-CM | POA: Diagnosis not present

## 2020-09-10 DIAGNOSIS — N186 End stage renal disease: Secondary | ICD-10-CM | POA: Diagnosis not present

## 2020-09-10 DIAGNOSIS — N2581 Secondary hyperparathyroidism of renal origin: Secondary | ICD-10-CM | POA: Diagnosis not present

## 2020-09-10 DIAGNOSIS — Z992 Dependence on renal dialysis: Secondary | ICD-10-CM | POA: Diagnosis not present

## 2020-09-12 DIAGNOSIS — N2581 Secondary hyperparathyroidism of renal origin: Secondary | ICD-10-CM | POA: Diagnosis not present

## 2020-09-12 DIAGNOSIS — N186 End stage renal disease: Secondary | ICD-10-CM | POA: Diagnosis not present

## 2020-09-12 DIAGNOSIS — Z992 Dependence on renal dialysis: Secondary | ICD-10-CM | POA: Diagnosis not present

## 2020-09-14 DIAGNOSIS — N2581 Secondary hyperparathyroidism of renal origin: Secondary | ICD-10-CM | POA: Diagnosis not present

## 2020-09-14 DIAGNOSIS — N186 End stage renal disease: Secondary | ICD-10-CM | POA: Diagnosis not present

## 2020-09-14 DIAGNOSIS — Z992 Dependence on renal dialysis: Secondary | ICD-10-CM | POA: Diagnosis not present

## 2020-09-17 DIAGNOSIS — N186 End stage renal disease: Secondary | ICD-10-CM | POA: Diagnosis not present

## 2020-09-17 DIAGNOSIS — N2581 Secondary hyperparathyroidism of renal origin: Secondary | ICD-10-CM | POA: Diagnosis not present

## 2020-09-17 DIAGNOSIS — Z992 Dependence on renal dialysis: Secondary | ICD-10-CM | POA: Diagnosis not present

## 2020-09-19 DIAGNOSIS — N186 End stage renal disease: Secondary | ICD-10-CM | POA: Diagnosis not present

## 2020-09-19 DIAGNOSIS — N2581 Secondary hyperparathyroidism of renal origin: Secondary | ICD-10-CM | POA: Diagnosis not present

## 2020-09-19 DIAGNOSIS — Z992 Dependence on renal dialysis: Secondary | ICD-10-CM | POA: Diagnosis not present

## 2020-09-21 DIAGNOSIS — Z992 Dependence on renal dialysis: Secondary | ICD-10-CM | POA: Diagnosis not present

## 2020-09-21 DIAGNOSIS — N2581 Secondary hyperparathyroidism of renal origin: Secondary | ICD-10-CM | POA: Diagnosis not present

## 2020-09-21 DIAGNOSIS — N186 End stage renal disease: Secondary | ICD-10-CM | POA: Diagnosis not present

## 2020-09-24 DIAGNOSIS — N2581 Secondary hyperparathyroidism of renal origin: Secondary | ICD-10-CM | POA: Diagnosis not present

## 2020-09-24 DIAGNOSIS — Z992 Dependence on renal dialysis: Secondary | ICD-10-CM | POA: Diagnosis not present

## 2020-09-24 DIAGNOSIS — N186 End stage renal disease: Secondary | ICD-10-CM | POA: Diagnosis not present

## 2020-09-26 DIAGNOSIS — Z992 Dependence on renal dialysis: Secondary | ICD-10-CM | POA: Diagnosis not present

## 2020-09-26 DIAGNOSIS — N186 End stage renal disease: Secondary | ICD-10-CM | POA: Diagnosis not present

## 2020-09-26 DIAGNOSIS — N2581 Secondary hyperparathyroidism of renal origin: Secondary | ICD-10-CM | POA: Diagnosis not present

## 2020-09-28 DIAGNOSIS — Z992 Dependence on renal dialysis: Secondary | ICD-10-CM | POA: Diagnosis not present

## 2020-09-28 DIAGNOSIS — N186 End stage renal disease: Secondary | ICD-10-CM | POA: Diagnosis not present

## 2020-09-28 DIAGNOSIS — N2581 Secondary hyperparathyroidism of renal origin: Secondary | ICD-10-CM | POA: Diagnosis not present

## 2020-10-01 DIAGNOSIS — Z992 Dependence on renal dialysis: Secondary | ICD-10-CM | POA: Diagnosis not present

## 2020-10-01 DIAGNOSIS — N186 End stage renal disease: Secondary | ICD-10-CM | POA: Diagnosis not present

## 2020-10-01 DIAGNOSIS — N2581 Secondary hyperparathyroidism of renal origin: Secondary | ICD-10-CM | POA: Diagnosis not present

## 2020-10-03 DIAGNOSIS — N186 End stage renal disease: Secondary | ICD-10-CM | POA: Diagnosis not present

## 2020-10-03 DIAGNOSIS — N2581 Secondary hyperparathyroidism of renal origin: Secondary | ICD-10-CM | POA: Diagnosis not present

## 2020-10-03 DIAGNOSIS — Z992 Dependence on renal dialysis: Secondary | ICD-10-CM | POA: Diagnosis not present

## 2020-10-07 DIAGNOSIS — Z992 Dependence on renal dialysis: Secondary | ICD-10-CM | POA: Diagnosis not present

## 2020-10-07 DIAGNOSIS — N186 End stage renal disease: Secondary | ICD-10-CM | POA: Diagnosis not present

## 2020-10-07 DIAGNOSIS — E1122 Type 2 diabetes mellitus with diabetic chronic kidney disease: Secondary | ICD-10-CM | POA: Diagnosis not present

## 2020-10-08 DIAGNOSIS — N2581 Secondary hyperparathyroidism of renal origin: Secondary | ICD-10-CM | POA: Diagnosis not present

## 2020-10-08 DIAGNOSIS — N186 End stage renal disease: Secondary | ICD-10-CM | POA: Diagnosis not present

## 2020-10-08 DIAGNOSIS — Z992 Dependence on renal dialysis: Secondary | ICD-10-CM | POA: Diagnosis not present

## 2020-10-10 DIAGNOSIS — N186 End stage renal disease: Secondary | ICD-10-CM | POA: Diagnosis not present

## 2020-10-10 DIAGNOSIS — N2581 Secondary hyperparathyroidism of renal origin: Secondary | ICD-10-CM | POA: Diagnosis not present

## 2020-10-10 DIAGNOSIS — Z992 Dependence on renal dialysis: Secondary | ICD-10-CM | POA: Diagnosis not present

## 2020-10-12 DIAGNOSIS — Z992 Dependence on renal dialysis: Secondary | ICD-10-CM | POA: Diagnosis not present

## 2020-10-12 DIAGNOSIS — N186 End stage renal disease: Secondary | ICD-10-CM | POA: Diagnosis not present

## 2020-10-12 DIAGNOSIS — N2581 Secondary hyperparathyroidism of renal origin: Secondary | ICD-10-CM | POA: Diagnosis not present

## 2020-10-15 DIAGNOSIS — N2581 Secondary hyperparathyroidism of renal origin: Secondary | ICD-10-CM | POA: Diagnosis not present

## 2020-10-15 DIAGNOSIS — Z992 Dependence on renal dialysis: Secondary | ICD-10-CM | POA: Diagnosis not present

## 2020-10-15 DIAGNOSIS — N186 End stage renal disease: Secondary | ICD-10-CM | POA: Diagnosis not present

## 2020-10-17 DIAGNOSIS — Z992 Dependence on renal dialysis: Secondary | ICD-10-CM | POA: Diagnosis not present

## 2020-10-17 DIAGNOSIS — N2581 Secondary hyperparathyroidism of renal origin: Secondary | ICD-10-CM | POA: Diagnosis not present

## 2020-10-17 DIAGNOSIS — N186 End stage renal disease: Secondary | ICD-10-CM | POA: Diagnosis not present

## 2020-10-19 DIAGNOSIS — N186 End stage renal disease: Secondary | ICD-10-CM | POA: Diagnosis not present

## 2020-10-19 DIAGNOSIS — N2581 Secondary hyperparathyroidism of renal origin: Secondary | ICD-10-CM | POA: Diagnosis not present

## 2020-10-19 DIAGNOSIS — Z992 Dependence on renal dialysis: Secondary | ICD-10-CM | POA: Diagnosis not present

## 2020-10-22 DIAGNOSIS — N2581 Secondary hyperparathyroidism of renal origin: Secondary | ICD-10-CM | POA: Diagnosis not present

## 2020-10-22 DIAGNOSIS — N186 End stage renal disease: Secondary | ICD-10-CM | POA: Diagnosis not present

## 2020-10-22 DIAGNOSIS — Z992 Dependence on renal dialysis: Secondary | ICD-10-CM | POA: Diagnosis not present

## 2020-10-24 DIAGNOSIS — Z992 Dependence on renal dialysis: Secondary | ICD-10-CM | POA: Diagnosis not present

## 2020-10-24 DIAGNOSIS — N186 End stage renal disease: Secondary | ICD-10-CM | POA: Diagnosis not present

## 2020-10-24 DIAGNOSIS — N2581 Secondary hyperparathyroidism of renal origin: Secondary | ICD-10-CM | POA: Diagnosis not present

## 2020-10-26 DIAGNOSIS — Z992 Dependence on renal dialysis: Secondary | ICD-10-CM | POA: Diagnosis not present

## 2020-10-26 DIAGNOSIS — N2581 Secondary hyperparathyroidism of renal origin: Secondary | ICD-10-CM | POA: Diagnosis not present

## 2020-10-26 DIAGNOSIS — N186 End stage renal disease: Secondary | ICD-10-CM | POA: Diagnosis not present

## 2020-10-28 DIAGNOSIS — N186 End stage renal disease: Secondary | ICD-10-CM | POA: Diagnosis not present

## 2020-10-28 DIAGNOSIS — Z992 Dependence on renal dialysis: Secondary | ICD-10-CM | POA: Diagnosis not present

## 2020-10-28 DIAGNOSIS — N2581 Secondary hyperparathyroidism of renal origin: Secondary | ICD-10-CM | POA: Diagnosis not present

## 2020-10-30 DIAGNOSIS — N186 End stage renal disease: Secondary | ICD-10-CM | POA: Diagnosis not present

## 2020-10-30 DIAGNOSIS — Z992 Dependence on renal dialysis: Secondary | ICD-10-CM | POA: Diagnosis not present

## 2020-10-30 DIAGNOSIS — N2581 Secondary hyperparathyroidism of renal origin: Secondary | ICD-10-CM | POA: Diagnosis not present

## 2020-11-02 DIAGNOSIS — N2581 Secondary hyperparathyroidism of renal origin: Secondary | ICD-10-CM | POA: Diagnosis not present

## 2020-11-02 DIAGNOSIS — N186 End stage renal disease: Secondary | ICD-10-CM | POA: Diagnosis not present

## 2020-11-02 DIAGNOSIS — Z992 Dependence on renal dialysis: Secondary | ICD-10-CM | POA: Diagnosis not present

## 2020-11-05 DIAGNOSIS — N186 End stage renal disease: Secondary | ICD-10-CM | POA: Diagnosis not present

## 2020-11-05 DIAGNOSIS — N2581 Secondary hyperparathyroidism of renal origin: Secondary | ICD-10-CM | POA: Diagnosis not present

## 2020-11-05 DIAGNOSIS — Z992 Dependence on renal dialysis: Secondary | ICD-10-CM | POA: Diagnosis not present

## 2020-11-06 DIAGNOSIS — E1122 Type 2 diabetes mellitus with diabetic chronic kidney disease: Secondary | ICD-10-CM | POA: Diagnosis not present

## 2020-11-06 DIAGNOSIS — Z992 Dependence on renal dialysis: Secondary | ICD-10-CM | POA: Diagnosis not present

## 2020-11-06 DIAGNOSIS — N186 End stage renal disease: Secondary | ICD-10-CM | POA: Diagnosis not present

## 2020-11-07 DIAGNOSIS — N186 End stage renal disease: Secondary | ICD-10-CM | POA: Diagnosis not present

## 2020-11-07 DIAGNOSIS — N2581 Secondary hyperparathyroidism of renal origin: Secondary | ICD-10-CM | POA: Diagnosis not present

## 2020-11-07 DIAGNOSIS — Z992 Dependence on renal dialysis: Secondary | ICD-10-CM | POA: Diagnosis not present

## 2020-11-09 DIAGNOSIS — N186 End stage renal disease: Secondary | ICD-10-CM | POA: Diagnosis not present

## 2020-11-09 DIAGNOSIS — Z992 Dependence on renal dialysis: Secondary | ICD-10-CM | POA: Diagnosis not present

## 2020-11-09 DIAGNOSIS — N2581 Secondary hyperparathyroidism of renal origin: Secondary | ICD-10-CM | POA: Diagnosis not present

## 2020-11-12 DIAGNOSIS — N186 End stage renal disease: Secondary | ICD-10-CM | POA: Diagnosis not present

## 2020-11-12 DIAGNOSIS — N2581 Secondary hyperparathyroidism of renal origin: Secondary | ICD-10-CM | POA: Diagnosis not present

## 2020-11-12 DIAGNOSIS — Z992 Dependence on renal dialysis: Secondary | ICD-10-CM | POA: Diagnosis not present

## 2020-11-14 DIAGNOSIS — N186 End stage renal disease: Secondary | ICD-10-CM | POA: Diagnosis not present

## 2020-11-14 DIAGNOSIS — N2581 Secondary hyperparathyroidism of renal origin: Secondary | ICD-10-CM | POA: Diagnosis not present

## 2020-11-14 DIAGNOSIS — Z992 Dependence on renal dialysis: Secondary | ICD-10-CM | POA: Diagnosis not present

## 2020-11-16 DIAGNOSIS — N2581 Secondary hyperparathyroidism of renal origin: Secondary | ICD-10-CM | POA: Diagnosis not present

## 2020-11-16 DIAGNOSIS — N186 End stage renal disease: Secondary | ICD-10-CM | POA: Diagnosis not present

## 2020-11-16 DIAGNOSIS — Z992 Dependence on renal dialysis: Secondary | ICD-10-CM | POA: Diagnosis not present

## 2020-11-19 DIAGNOSIS — N2581 Secondary hyperparathyroidism of renal origin: Secondary | ICD-10-CM | POA: Diagnosis not present

## 2020-11-19 DIAGNOSIS — N186 End stage renal disease: Secondary | ICD-10-CM | POA: Diagnosis not present

## 2020-11-19 DIAGNOSIS — Z992 Dependence on renal dialysis: Secondary | ICD-10-CM | POA: Diagnosis not present

## 2020-11-21 DIAGNOSIS — N186 End stage renal disease: Secondary | ICD-10-CM | POA: Diagnosis not present

## 2020-11-21 DIAGNOSIS — Z992 Dependence on renal dialysis: Secondary | ICD-10-CM | POA: Diagnosis not present

## 2020-11-21 DIAGNOSIS — N2581 Secondary hyperparathyroidism of renal origin: Secondary | ICD-10-CM | POA: Diagnosis not present

## 2020-11-23 DIAGNOSIS — N2581 Secondary hyperparathyroidism of renal origin: Secondary | ICD-10-CM | POA: Diagnosis not present

## 2020-11-23 DIAGNOSIS — Z992 Dependence on renal dialysis: Secondary | ICD-10-CM | POA: Diagnosis not present

## 2020-11-23 DIAGNOSIS — N186 End stage renal disease: Secondary | ICD-10-CM | POA: Diagnosis not present

## 2020-11-26 DIAGNOSIS — N186 End stage renal disease: Secondary | ICD-10-CM | POA: Diagnosis not present

## 2020-11-26 DIAGNOSIS — N2581 Secondary hyperparathyroidism of renal origin: Secondary | ICD-10-CM | POA: Diagnosis not present

## 2020-11-26 DIAGNOSIS — Z992 Dependence on renal dialysis: Secondary | ICD-10-CM | POA: Diagnosis not present

## 2020-11-30 DIAGNOSIS — N2581 Secondary hyperparathyroidism of renal origin: Secondary | ICD-10-CM | POA: Diagnosis not present

## 2020-11-30 DIAGNOSIS — N186 End stage renal disease: Secondary | ICD-10-CM | POA: Diagnosis not present

## 2020-11-30 DIAGNOSIS — Z992 Dependence on renal dialysis: Secondary | ICD-10-CM | POA: Diagnosis not present

## 2020-12-03 DIAGNOSIS — Z992 Dependence on renal dialysis: Secondary | ICD-10-CM | POA: Diagnosis not present

## 2020-12-03 DIAGNOSIS — N2581 Secondary hyperparathyroidism of renal origin: Secondary | ICD-10-CM | POA: Diagnosis not present

## 2020-12-03 DIAGNOSIS — N186 End stage renal disease: Secondary | ICD-10-CM | POA: Diagnosis not present

## 2020-12-03 DIAGNOSIS — R112 Nausea with vomiting, unspecified: Secondary | ICD-10-CM | POA: Diagnosis not present

## 2020-12-03 DIAGNOSIS — R110 Nausea: Secondary | ICD-10-CM | POA: Diagnosis not present

## 2020-12-05 DIAGNOSIS — R112 Nausea with vomiting, unspecified: Secondary | ICD-10-CM | POA: Diagnosis not present

## 2020-12-05 DIAGNOSIS — Z992 Dependence on renal dialysis: Secondary | ICD-10-CM | POA: Diagnosis not present

## 2020-12-05 DIAGNOSIS — N2581 Secondary hyperparathyroidism of renal origin: Secondary | ICD-10-CM | POA: Diagnosis not present

## 2020-12-05 DIAGNOSIS — N186 End stage renal disease: Secondary | ICD-10-CM | POA: Diagnosis not present

## 2020-12-05 DIAGNOSIS — R110 Nausea: Secondary | ICD-10-CM | POA: Diagnosis not present

## 2020-12-07 DIAGNOSIS — N186 End stage renal disease: Secondary | ICD-10-CM | POA: Diagnosis not present

## 2020-12-07 DIAGNOSIS — N2581 Secondary hyperparathyroidism of renal origin: Secondary | ICD-10-CM | POA: Diagnosis not present

## 2020-12-07 DIAGNOSIS — E1122 Type 2 diabetes mellitus with diabetic chronic kidney disease: Secondary | ICD-10-CM | POA: Diagnosis not present

## 2020-12-07 DIAGNOSIS — Z992 Dependence on renal dialysis: Secondary | ICD-10-CM | POA: Diagnosis not present

## 2020-12-07 DIAGNOSIS — R112 Nausea with vomiting, unspecified: Secondary | ICD-10-CM | POA: Diagnosis not present

## 2020-12-07 DIAGNOSIS — R110 Nausea: Secondary | ICD-10-CM | POA: Diagnosis not present

## 2020-12-08 ENCOUNTER — Emergency Department (HOSPITAL_COMMUNITY): Payer: Federal, State, Local not specified - PPO

## 2020-12-08 ENCOUNTER — Other Ambulatory Visit: Payer: Self-pay

## 2020-12-08 ENCOUNTER — Inpatient Hospital Stay (HOSPITAL_COMMUNITY)
Admission: EM | Admit: 2020-12-08 | Discharge: 2020-12-12 | DRG: 640 | Disposition: A | Discharge status: Home / Self Care | Payer: Federal, State, Local not specified - PPO | Attending: Internal Medicine | Admitting: Internal Medicine

## 2020-12-08 DIAGNOSIS — J189 Pneumonia, unspecified organism: Secondary | ICD-10-CM | POA: Diagnosis not present

## 2020-12-08 DIAGNOSIS — R0602 Shortness of breath: Secondary | ICD-10-CM | POA: Diagnosis not present

## 2020-12-08 DIAGNOSIS — D638 Anemia in other chronic diseases classified elsewhere: Secondary | ICD-10-CM | POA: Diagnosis present

## 2020-12-08 DIAGNOSIS — E1122 Type 2 diabetes mellitus with diabetic chronic kidney disease: Secondary | ICD-10-CM | POA: Diagnosis present

## 2020-12-08 DIAGNOSIS — Z86718 Personal history of other venous thrombosis and embolism: Secondary | ICD-10-CM | POA: Diagnosis not present

## 2020-12-08 DIAGNOSIS — R29810 Facial weakness: Secondary | ICD-10-CM | POA: Diagnosis not present

## 2020-12-08 DIAGNOSIS — J90 Pleural effusion, not elsewhere classified: Secondary | ICD-10-CM | POA: Diagnosis not present

## 2020-12-08 DIAGNOSIS — I5033 Acute on chronic diastolic (congestive) heart failure: Secondary | ICD-10-CM | POA: Diagnosis present

## 2020-12-08 DIAGNOSIS — J811 Chronic pulmonary edema: Secondary | ICD-10-CM | POA: Diagnosis not present

## 2020-12-08 DIAGNOSIS — D631 Anemia in chronic kidney disease: Secondary | ICD-10-CM | POA: Diagnosis present

## 2020-12-08 DIAGNOSIS — Z20822 Contact with and (suspected) exposure to covid-19: Secondary | ICD-10-CM | POA: Diagnosis present

## 2020-12-08 DIAGNOSIS — Q211 Atrial septal defect: Secondary | ICD-10-CM

## 2020-12-08 DIAGNOSIS — N2581 Secondary hyperparathyroidism of renal origin: Secondary | ICD-10-CM | POA: Diagnosis present

## 2020-12-08 DIAGNOSIS — Z992 Dependence on renal dialysis: Secondary | ICD-10-CM | POA: Diagnosis not present

## 2020-12-08 DIAGNOSIS — I10 Essential (primary) hypertension: Secondary | ICD-10-CM | POA: Diagnosis present

## 2020-12-08 DIAGNOSIS — Z86711 Personal history of pulmonary embolism: Secondary | ICD-10-CM | POA: Diagnosis not present

## 2020-12-08 DIAGNOSIS — Z79899 Other long term (current) drug therapy: Secondary | ICD-10-CM

## 2020-12-08 DIAGNOSIS — I120 Hypertensive chronic kidney disease with stage 5 chronic kidney disease or end stage renal disease: Secondary | ICD-10-CM | POA: Diagnosis not present

## 2020-12-08 DIAGNOSIS — N186 End stage renal disease: Secondary | ICD-10-CM | POA: Diagnosis not present

## 2020-12-08 DIAGNOSIS — M109 Gout, unspecified: Secondary | ICD-10-CM | POA: Diagnosis present

## 2020-12-08 DIAGNOSIS — I330 Acute and subacute infective endocarditis: Secondary | ICD-10-CM | POA: Diagnosis not present

## 2020-12-08 DIAGNOSIS — J9601 Acute respiratory failure with hypoxia: Secondary | ICD-10-CM | POA: Diagnosis present

## 2020-12-08 DIAGNOSIS — I517 Cardiomegaly: Secondary | ICD-10-CM | POA: Diagnosis not present

## 2020-12-08 DIAGNOSIS — E8889 Other specified metabolic disorders: Secondary | ICD-10-CM | POA: Diagnosis not present

## 2020-12-08 DIAGNOSIS — R112 Nausea with vomiting, unspecified: Secondary | ICD-10-CM | POA: Diagnosis present

## 2020-12-08 DIAGNOSIS — Z1152 Encounter for screening for COVID-19: Secondary | ICD-10-CM | POA: Diagnosis not present

## 2020-12-08 DIAGNOSIS — Z794 Long term (current) use of insulin: Secondary | ICD-10-CM

## 2020-12-08 DIAGNOSIS — E8770 Fluid overload, unspecified: Secondary | ICD-10-CM | POA: Diagnosis not present

## 2020-12-08 DIAGNOSIS — R197 Diarrhea, unspecified: Secondary | ICD-10-CM | POA: Diagnosis present

## 2020-12-08 DIAGNOSIS — R0902 Hypoxemia: Secondary | ICD-10-CM | POA: Diagnosis not present

## 2020-12-08 DIAGNOSIS — E785 Hyperlipidemia, unspecified: Secondary | ICD-10-CM | POA: Diagnosis present

## 2020-12-08 DIAGNOSIS — I340 Nonrheumatic mitral (valve) insufficiency: Secondary | ICD-10-CM | POA: Diagnosis not present

## 2020-12-08 DIAGNOSIS — J810 Acute pulmonary edema: Secondary | ICD-10-CM | POA: Diagnosis not present

## 2020-12-08 DIAGNOSIS — I5021 Acute systolic (congestive) heart failure: Secondary | ICD-10-CM | POA: Diagnosis not present

## 2020-12-08 DIAGNOSIS — E1165 Type 2 diabetes mellitus with hyperglycemia: Secondary | ICD-10-CM | POA: Diagnosis present

## 2020-12-08 DIAGNOSIS — I313 Pericardial effusion (noninflammatory): Secondary | ICD-10-CM | POA: Diagnosis not present

## 2020-12-08 LAB — CBC
HCT: 34.1 % — ABNORMAL LOW (ref 36.0–46.0)
Hemoglobin: 10.5 g/dL — ABNORMAL LOW (ref 12.0–15.0)
MCH: 25.1 pg — ABNORMAL LOW (ref 26.0–34.0)
MCHC: 30.8 g/dL (ref 30.0–36.0)
MCV: 81.6 fL (ref 80.0–100.0)
Platelets: 177 10*3/uL (ref 150–400)
RBC: 4.18 MIL/uL (ref 3.87–5.11)
RDW: 18 % — ABNORMAL HIGH (ref 11.5–15.5)
WBC: 6.4 10*3/uL (ref 4.0–10.5)
nRBC: 0 % (ref 0.0–0.2)

## 2020-12-08 LAB — RESP PANEL BY RT-PCR (FLU A&B, COVID) ARPGX2
Influenza A by PCR: NEGATIVE
Influenza B by PCR: NEGATIVE
SARS Coronavirus 2 by RT PCR: NEGATIVE

## 2020-12-08 LAB — BASIC METABOLIC PANEL
Anion gap: 14 (ref 5–15)
BUN: 28 mg/dL — ABNORMAL HIGH (ref 6–20)
CO2: 29 mmol/L (ref 22–32)
Calcium: 9.2 mg/dL (ref 8.9–10.3)
Chloride: 94 mmol/L — ABNORMAL LOW (ref 98–111)
Creatinine, Ser: 8.5 mg/dL — ABNORMAL HIGH (ref 0.44–1.00)
GFR, Estimated: 5 mL/min — ABNORMAL LOW (ref >60)
Glucose, Bld: 256 mg/dL — ABNORMAL HIGH (ref 70–99)
Potassium: 3.9 mmol/L (ref 3.5–5.1)
Sodium: 137 mmol/L (ref 135–145)

## 2020-12-08 LAB — CBG MONITORING, ED: Glucose-Capillary: 186 mg/dL — ABNORMAL HIGH (ref 70–99)

## 2020-12-08 MED ORDER — IOHEXOL 350 MG/ML SOLN
100.0000 mL | Freq: Once | INTRAVENOUS | Status: AC | PRN
  Administered 2020-12-08: 100 mL via INTRAVENOUS

## 2020-12-08 MED ORDER — ALBUTEROL SULFATE HFA 108 (90 BASE) MCG/ACT IN AERS
2.0000 | INHALATION_SPRAY | RESPIRATORY_TRACT | Status: DC | PRN
  Filled 2020-12-08: qty 6.7

## 2020-12-08 NOTE — ED Provider Notes (Addendum)
[facility] EMERGENCY DEPARTMENT Provider Note   CSN: [id number] Arrival date & time: 12/08/20  1634     History Chief Complaint  Patient presents with  . hypoxia    Colleen [name] is a 60 y.o. female.  HPI     60 year old female comes in a chief complaint of low oxygen.  Patient has history of DVT/PE, ESRD on HD MWF, patient is not on any anticoagulation at this time.  Patient reports that she started having some weakness, chills, subjective fevers, nausea, diarrhea few days back.  She went to urgent care, and was tested negative for Covid.  They told her however that she had a pneumonia and she was hypoxic, therefore she was sent to the ER with 2 L of oxygen.  Patient denies any shortness of breath, chest pain.  She states that she just feels tired with any kind of activity.  She last was dialyzed yesterday.  She denies any worsening orthopnea, PND.  Past Medical History:  Diagnosis Date  . Anemia   . Chronic kidney disease    ESRD [place] -  . Diabetes ([place])    type II  . DVT (deep venous thrombosis) ([place])   . E-coli UTI   . Gout 06/05/2018  . History of blood transfusion   . HLD (hyperlipidemia) 06/05/2018  . Hypertension   . Kidney failure   . PE (pulmonary embolism)   . 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 Acute respiratory failure with hypoxia ([place]) 12/08/2020  . Adenomatous polyp of ascending colon   . Adenomatous polyp of descending colon   . Cecal polyp   . Colon cancer screening   . Grade I hemorrhoids   . Melanosis coli   . Polyp of sigmoid colon   . Rectal polyp   . Acute on chronic kidney failure ([place]) 12/31/2018  . Uremia 12/31/2018  . Hyperkalemia, diminished renal excretion 12/31/2018  . Metabolic acidosis, NAG, failure of bicarbonate regeneration 12/31/2018  . Long term (current) use of anticoagulants 09/07/2018   . DVT (deep venous thrombosis) ([place]) 08/24/2018  . Blindness of left eye 08/24/2018  . Acute kidney injury superimposed on CKD ([place]) 06/06/2018  . Left knee pain 06/06/2018  . Anemia due to chronic kidney disease 06/06/2018  . Hyponatremia 06/06/2018  . HLD (hyperlipidemia) 06/05/2018  . Hyperglycemia 06/05/2018  . Gout 06/05/2018  . Swelling of lower extremity 05/28/2018  . CKD (chronic kidney disease) stage 5, GFR less than 15 ml/min (HCC) 05/28/2018  . Hypochromic microcytic anemia 05/28/2018  . Lower extremity edema 05/28/2018  . Callosity 12/11/2015  . Paronychia of right middle finger 10/10/2015  . Coma of unknown cause ([place]) 10/01/2015  . Facial palsy as birth trauma 10/01/2015  . HTN (hypertension) 10/06/2013  . Pulmonary embolism ([place]) 10/06/2013  . Congestive dilated cardiomyopathy ([place]) 10/05/2013  . Carotid bruit 10/05/2013  . DM (diabetes mellitus), type 2, uncontrolled ([place]) 10/04/2013  . Uncontrolled diabetes mellitus type 2 without complications 76/19/5093  . Acute renal insufficiency 09/27/2013  . Septic shock(785.52) 09/25/2013  . Bacteremia 09/25/2013    Past Surgical History:  Procedure Laterality Date  . A/V FISTULAGRAM Left 07/29/2019   Procedure: A/V FISTULAGRAM;  Surgeon: Elam Dutch, MD;  Location: [place] CV LAB;  Service: Cardiovascular;  Laterality: Left;  . ARTERY EXPLORATION Left 06/14/2019  Procedure: Artery Exploration Left Upper Arm;  Surgeon: Elam Dutch, MD;  Location: [facility] OR;  Service: Vascular;  Laterality: Left;  . AV FISTULA PLACEMENT Left 12/31/2018   Procedure: ARTERIOVENOUS (AV) FISTULA CREATION VERSUS INSERTION OF ARTERIOVENOUS GRAFT LEFT ARM;  Surgeon: Elam Dutch, MD;  Location: [facility];  Service: Vascular;  Laterality: Left;  . BASCILIC VEIN TRANSPOSITION Left 06/14/2019   Procedure: SECOND STAGE BASILIC VEIN TRANSPOSITION LEFT ARM;  Surgeon: Elam Dutch, MD;  Location: [facility];  Service: Vascular;  Laterality:  Left;  . BIOPSY  03/05/2020   Procedure: BIOPSY;  Surgeon: Lavena Bullion, DO;  Location: [facility];  Service: Gastroenterology;;  . COLONOSCOPY WITH PROPOFOL N/A 03/05/2020   Procedure: COLONOSCOPY WITH PROPOFOL;  Surgeon: Lavena Bullion, DO;  Location: [facility];  Service: Gastroenterology;  Laterality: N/A;  . EYE SURGERY     9 total eye surgery including laser and cararact surgery  . HEMOSTASIS CLIP PLACEMENT  03/05/2020   Procedure: HEMOSTASIS CLIP PLACEMENT;  Surgeon: Lavena Bullion, DO;  Location: [facility];  Service: Gastroenterology;;  . INSERTION OF DIALYSIS CATHETER N/A 12/31/2018   Procedure: INSERTION OF TUNNELED  DIALYSIS CATHETER;  Surgeon: Elam Dutch, MD;  Location: [facility];  Service: Vascular;  Laterality: N/A;  . PERIPHERAL VASCULAR BALLOON ANGIOPLASTY Left 07/29/2019   Procedure: PERIPHERAL VASCULAR BALLOON ANGIOPLASTY;  Surgeon: Elam Dutch, MD;  Location: [place] CV LAB;  Service: Cardiovascular;  Laterality: Left;  ARM FISTULA  . POLYPECTOMY  03/05/2020   Procedure: POLYPECTOMY;  Surgeon: Lavena Bullion, DO;  Location: [facility];  Service: Gastroenterology;;     OB History   No obstetric history on file.     Family History  Problem Relation Age of Onset  . CAD Neg Hx   . Colon cancer Neg Hx   . Esophageal cancer Neg Hx   . Cancer Neg Hx     Social History   Tobacco Use  . Smoking status: Never Smoker  . Smokeless tobacco: Never Used  Vaping Use  . Vaping Use: Never used  Substance Use Topics  . Alcohol use: No  . Drug use: No    Home Medications Prior to Admission medications   Medication Sig Start Date End Date Taking? Authorizing Provider  atorvastatin (LIPITOR) 10 MG tablet Take 10 mg by mouth at bedtime.   Yes [provider]  carvedilol (COREG) 12.5 MG tablet Take 1 tablet (12.5 mg total) by mouth 2 (two) times daily with a meal for 21 days. Patient taking differently: Take 12.5 mg by mouth at  bedtime. 12/06/18 12/31/26 Yes Volanda Napoleon, PA-C  HUMALOG KWIKPEN 100 UNIT/ML KwikPen Inject 1-3 Units into the skin See admin instructions. Inject 1-3 units into the skin once a day, PER SLIDING SCALE 12/27/19  Yes [provider]  Insulin Glargine (BASAGLAR KWIKPEN) 100 UNIT/ML Inject 10 Units into the skin at bedtime. 10/07/19  Yes [provider]  lanthanum (FOSRENOL) 1000 MG chewable tablet Chew 1,000 mg by mouth 3 (three) times daily with meals.   Yes [provider]  amLODipine (NORVASC) 10 MG tablet Take 1 tablet (10 mg total) by mouth daily for 21 days. Patient taking differently: No sig reported 12/06/18 12/31/26  [place] Lanius A, PA-C  ferric citrate (AURYXIA) 1 GM 210 MG(Fe) tablet Take 630 mg by mouth 3 (three) times daily with meals.     [provider]  lidocaine-prilocaine (EMLA) cream Apply 1 application topically every Monday,  Wednesday, and Friday with hemodialysis. 01/26/20   [provider]    Allergies    Dapagliflozin, Hydrocodone, and Kombiglyze xr [saxagliptin-metformin er]  Review of Systems   Review of Systems  Constitutional: Positive for activity change.  Respiratory: Negative for shortness of breath.   Gastrointestinal: Positive for diarrhea, nausea and vomiting.  Allergic/Immunologic: Negative for immunocompromised state.  All other systems reviewed and are negative.   Physical Exam Updated Vital Signs BP (!) 153/72   Pulse 83   Temp 99.8 F (37.7 C) (Oral) Comment: rechecked. RN notified.   Resp 15   Ht 5\' 4"  (1.626 m)   Wt 68 kg   LMP 12/08/2013   SpO2 96%   BMI 25.75 kg/m   Physical Exam Vitals and nursing note reviewed.  Constitutional:      Appearance: She is well-developed.  HENT:     Head: Normocephalic and atraumatic.  Eyes:     Extraocular Movements: EOM normal.  Cardiovascular:     Rate and Rhythm: Normal rate.  Pulmonary:     Effort: Pulmonary effort is normal.     Breath sounds:  Rales present.     Comments: Diminished breath sound on the right side Abdominal:     General: Bowel sounds are normal.  Musculoskeletal:     Cervical back: Normal range of motion and neck supple.  Skin:    General: Skin is warm and dry.  Neurological:     Mental Status: She is alert and oriented to person, place, and time.     ED Results / Procedures / Treatments   Labs (all labs ordered are listed, but only abnormal results are displayed) Labs Reviewed  BASIC METABOLIC PANEL - Abnormal; Notable for the following components:      Result Value   Chloride 94 (*)    Glucose, Bld 256 (*)    BUN 28 (*)    Creatinine, Ser 8.50 (*)    GFR, Estimated 5 (*)    All other components within normal limits  CBC - Abnormal; Notable for the following components:   Hemoglobin 10.5 (*)    HCT 34.1 (*)    MCH 25.1 (*)    RDW 18.0 (*)    All other components within normal limits  CBG MONITORING, ED - Abnormal; Notable for the following components:   Glucose-Capillary 186 (*)    All other components within normal limits  RESP PANEL BY RT-PCR (FLU A&B, COVID) [id number]    EKG EKG Interpretation  Date/Time:  Saturday December 08 2020 18:56:11 EST Ventricular Rate:  86 PR Interval:    QRS Duration: 83 QT Interval:  397 QTC Calculation: 475 R Axis:   46 Text Interpretation: Sinus rhythm No acute changes No significant change since last tracing Confirmed by Varney Biles [phone]) on 12/08/2020 7:05:59 PM   Radiology CT Angio Chest PE W and/or Wo Contrast  Result Date: 12/08/2020 CLINICAL DATA:  Shortness of breath EXAM: CT ANGIOGRAPHY CHEST WITH CONTRAST TECHNIQUE: Multidetector CT imaging of the chest was performed using the standard protocol during bolus administration of intravenous contrast. Multiplanar CT image reconstructions and MIPs were obtained to evaluate the vascular anatomy. CONTRAST:  195mL OMNIPAQUE IOHEXOL 350 MG/ML SOLN COMPARISON:  None. FINDINGS: Cardiovascular: There is a  optimal opacification of the pulmonary arteries. There is no central,segmental, or subsegmental filling defects within the pulmonary arteries. The heart is normal in size. There is a small pericardial effusion present. No evidence right heart strain. There is normal three-vessel brachiocephalic  anatomy without proximal stenosis. Aortic atherosclerosis noted. Coronary artery calcifications are seen. Mediastinum/Nodes: No hilar, mediastinal, or axillary adenopathy. Thyroid gland, trachea, and esophagus demonstrate no significant findings. Lungs/Pleura: There is a right and trace effusion present. Patchy airspace opacity seen at the posterior right base. Interlobular septal thickening seen within. Upper Abdomen: No acute abnormalities present in the visualized portions of the upper abdomen. Musculoskeletal: No chest wall abnormality. No acute or significant osseous findings. Review of the MIP images confirms the above findings. IMPRESSION: No central, segmental subsegmental. Small right and trace left effusion. Findings which may be suggestive interstitial edema. Aortic Atherosclerosis (ICD10-I70.0). Electronically Signed   By: Prudencio Pair M.D.   On: 12/08/2020 21:59   DG Chest Portable 1 View  Result Date: 12/08/2020 CLINICAL DATA:  Shortness of breath possible COVID EXAM: PORTABLE CHEST 1 VIEW COMPARISON:  12/31/2018 FINDINGS: Cardiomegaly with vascular congestion and mild interstitial pulmonary edema. Small pleural effusions. Aortic atherosclerosis. No pneumothorax. IMPRESSION: Cardiomegaly with vascular congestion, mild interstitial edema and small pleural effusions. Electronically Signed   By: Donavan Foil M.D.   On: 12/08/2020 18:12    Procedures .Critical Care Performed by: Varney Biles, MD Authorized by: Varney Biles, MD   Critical care provider statement:    Critical care time (minutes):  62   Critical care was necessary to treat or prevent imminent or life-threatening deterioration of the  following conditions:  Respiratory failure   Critical care was time spent personally by me on the following activities:  Discussions with consultants, evaluation of patient's response to treatment, examination of patient, ordering and performing treatments and interventions, ordering and review of laboratory studies, ordering and review of radiographic studies, pulse oximetry, re-evaluation of patient's condition, obtaining history from patient or surrogate and review of old charts   (including critical care time)  Medications Ordered in ED Medications  albuterol (VENTOLIN HFA) 108 (90 Base) MCG/ACT inhaler 2 puff (has no administration in time range)  iohexol (OMNIPAQUE) 350 MG/ML injection 100 mL (100 mLs Intravenous Contrast Given 12/08/20 2149)    ED Course  I have reviewed the triage vital signs and the nursing notes.  Pertinent labs & imaging results that were available during my care of the patient were reviewed by me and considered in my medical decision making (see chart for details).    MDM Rules/Calculators/A&P                         60 year old female comes in a chief complaint of low oxygen.  She has been feeling weak and having viral prodrome-like symptoms.  Initial suspicion was that she has COVID-19.  However PCR is negative.  Chest x-ray did reveal interstitial edema.  She is up-to-date with her hemodialysis, with her last session being yesterday.  She is having rales bilaterally with diminished breath sound on the right side, but the chest x-ray is not as concerning for her to have profound hypoxia.  Perhaps a chest x-ray is under estimating the volume overload.  Patient has history of PE she is on any blood thinners.  Given that the chest x-ray was underwhelming, we did order CT PE which is negative for acute PE.  She is stable for admission  Hospitalist service will consult nephrology, appreciated.  Delainee Tramel B[place]feld was evaluated in Emergency Department on 12/08/2020  for the symptoms described in the history of present illness. She was evaluated in the context of the global COVID-19 pandemic, which necessitated consideration that the  patient might be at risk for infection with the SARS-CoV-2 virus that causes COVID-19. Institutional protocols and algorithms that pertain to the evaluation of patients at risk for COVID-19 are in a state of rapid change based on information released by regulatory bodies including the CDC and federal and state organizations. These policies and algorithms were followed during the patient's care in the ED.   Final Clinical Impression(s) / ED Diagnoses Final diagnoses:  Acute respiratory failure with hypoxia [facility])    Rx / DC Orders ED Discharge Orders    None       Varney Biles, MD 12/08/20 0677    Varney Biles, MD 12/08/20 2334

## 2020-12-08 NOTE — ED Triage Notes (Signed)
Pt presents to ED POV. Pt c/o malaise, diarrhea. Pt states that she thought she had covid so went to get tested for covid, it was negative. They did xray and saw double pneumonia. Pt presented w/ 85% RA. Placed on 2L [place] now 98%. No reports SOB, pt is vaccinated

## 2020-12-08 NOTE — ED Notes (Signed)
Pt refusing covid swab at this time. Pt has medical records for UC w/ neg test today

## 2020-12-08 NOTE — ED Notes (Signed)
Pt arrived to ED with SOB.  O2 sats 85% on room air.  Placed on 2 liters [place] and sats up to 95%.

## 2020-12-09 ENCOUNTER — Encounter (HOSPITAL_COMMUNITY): Payer: Self-pay | Admitting: Internal Medicine

## 2020-12-09 ENCOUNTER — Observation Stay (HOSPITAL_COMMUNITY): Payer: Federal, State, Local not specified - PPO

## 2020-12-09 DIAGNOSIS — J810 Acute pulmonary edema: Secondary | ICD-10-CM

## 2020-12-09 DIAGNOSIS — Z992 Dependence on renal dialysis: Secondary | ICD-10-CM

## 2020-12-09 DIAGNOSIS — I340 Nonrheumatic mitral (valve) insufficiency: Secondary | ICD-10-CM | POA: Diagnosis not present

## 2020-12-09 DIAGNOSIS — Z20822 Contact with and (suspected) exposure to covid-19: Secondary | ICD-10-CM | POA: Diagnosis not present

## 2020-12-09 DIAGNOSIS — I330 Acute and subacute infective endocarditis: Secondary | ICD-10-CM | POA: Diagnosis not present

## 2020-12-09 DIAGNOSIS — I5021 Acute systolic (congestive) heart failure: Secondary | ICD-10-CM

## 2020-12-09 DIAGNOSIS — Z86711 Personal history of pulmonary embolism: Secondary | ICD-10-CM | POA: Diagnosis not present

## 2020-12-09 DIAGNOSIS — I313 Pericardial effusion (noninflammatory): Secondary | ICD-10-CM | POA: Diagnosis not present

## 2020-12-09 DIAGNOSIS — J9601 Acute respiratory failure with hypoxia: Secondary | ICD-10-CM | POA: Diagnosis not present

## 2020-12-09 DIAGNOSIS — N2581 Secondary hyperparathyroidism of renal origin: Secondary | ICD-10-CM | POA: Diagnosis not present

## 2020-12-09 DIAGNOSIS — R197 Diarrhea, unspecified: Secondary | ICD-10-CM | POA: Diagnosis present

## 2020-12-09 DIAGNOSIS — N186 End stage renal disease: Secondary | ICD-10-CM | POA: Diagnosis not present

## 2020-12-09 DIAGNOSIS — M109 Gout, unspecified: Secondary | ICD-10-CM | POA: Diagnosis present

## 2020-12-09 DIAGNOSIS — D638 Anemia in other chronic diseases classified elsewhere: Secondary | ICD-10-CM | POA: Diagnosis present

## 2020-12-09 DIAGNOSIS — Z79899 Other long term (current) drug therapy: Secondary | ICD-10-CM | POA: Diagnosis not present

## 2020-12-09 DIAGNOSIS — E8889 Other specified metabolic disorders: Secondary | ICD-10-CM | POA: Diagnosis present

## 2020-12-09 DIAGNOSIS — E8770 Fluid overload, unspecified: Secondary | ICD-10-CM | POA: Diagnosis not present

## 2020-12-09 DIAGNOSIS — E1122 Type 2 diabetes mellitus with diabetic chronic kidney disease: Secondary | ICD-10-CM | POA: Diagnosis not present

## 2020-12-09 DIAGNOSIS — R112 Nausea with vomiting, unspecified: Secondary | ICD-10-CM | POA: Diagnosis present

## 2020-12-09 DIAGNOSIS — I10 Essential (primary) hypertension: Secondary | ICD-10-CM

## 2020-12-09 DIAGNOSIS — Z86718 Personal history of other venous thrombosis and embolism: Secondary | ICD-10-CM | POA: Diagnosis not present

## 2020-12-09 DIAGNOSIS — R29810 Facial weakness: Secondary | ICD-10-CM | POA: Diagnosis present

## 2020-12-09 DIAGNOSIS — I120 Hypertensive chronic kidney disease with stage 5 chronic kidney disease or end stage renal disease: Secondary | ICD-10-CM | POA: Diagnosis not present

## 2020-12-09 DIAGNOSIS — E1165 Type 2 diabetes mellitus with hyperglycemia: Secondary | ICD-10-CM | POA: Diagnosis present

## 2020-12-09 DIAGNOSIS — D631 Anemia in chronic kidney disease: Secondary | ICD-10-CM | POA: Diagnosis not present

## 2020-12-09 DIAGNOSIS — E785 Hyperlipidemia, unspecified: Secondary | ICD-10-CM | POA: Diagnosis not present

## 2020-12-09 DIAGNOSIS — Z794 Long term (current) use of insulin: Secondary | ICD-10-CM | POA: Diagnosis not present

## 2020-12-09 DIAGNOSIS — I5033 Acute on chronic diastolic (congestive) heart failure: Secondary | ICD-10-CM | POA: Diagnosis present

## 2020-12-09 DIAGNOSIS — Q211 Atrial septal defect: Secondary | ICD-10-CM | POA: Diagnosis not present

## 2020-12-09 LAB — COMPREHENSIVE METABOLIC PANEL
ALT: 8 U/L (ref 0–44)
AST: 10 U/L — ABNORMAL LOW (ref 15–41)
Albumin: 2.6 g/dL — ABNORMAL LOW (ref 3.5–5.0)
Alkaline Phosphatase: 66 U/L (ref 38–126)
Anion gap: 14 (ref 5–15)
BUN: 34 mg/dL — ABNORMAL HIGH (ref 6–20)
CO2: 28 mmol/L (ref 22–32)
Calcium: 9.3 mg/dL (ref 8.9–10.3)
Chloride: 95 mmol/L — ABNORMAL LOW (ref 98–111)
Creatinine, Ser: 9.52 mg/dL — ABNORMAL HIGH (ref 0.44–1.00)
GFR, Estimated: 4 mL/min — ABNORMAL LOW (ref >60)
Glucose, Bld: 194 mg/dL — ABNORMAL HIGH (ref 70–99)
Potassium: 3.9 mmol/L (ref 3.5–5.1)
Sodium: 137 mmol/L (ref 135–145)
Total Bilirubin: 0.5 mg/dL (ref 0.3–1.2)
Total Protein: 6.6 g/dL (ref 6.5–8.1)

## 2020-12-09 LAB — GLUCOSE, CAPILLARY: Glucose-Capillary: 179 mg/dL — ABNORMAL HIGH (ref 70–99)

## 2020-12-09 LAB — CBC WITH DIFFERENTIAL/PLATELET
Abs Immature Granulocytes: 0.02 10*3/uL (ref 0.00–0.07)
Basophils Absolute: 0 10*3/uL (ref 0.0–0.1)
Basophils Relative: 1 %
Eosinophils Absolute: 0.3 10*3/uL (ref 0.0–0.5)
Eosinophils Relative: 5 %
HCT: 32.5 % — ABNORMAL LOW (ref 36.0–46.0)
Hemoglobin: 9.9 g/dL — ABNORMAL LOW (ref 12.0–15.0)
Immature Granulocytes: 0 %
Lymphocytes Relative: 14 %
Lymphs Abs: 0.8 10*3/uL (ref 0.7–4.0)
MCH: 25 pg — ABNORMAL LOW (ref 26.0–34.0)
MCHC: 30.5 g/dL (ref 30.0–36.0)
MCV: 82.1 fL (ref 80.0–100.0)
Monocytes Absolute: 0.6 10*3/uL (ref 0.1–1.0)
Monocytes Relative: 10 %
Neutro Abs: 4.3 10*3/uL (ref 1.7–7.7)
Neutrophils Relative %: 70 %
Platelets: 156 10*3/uL (ref 150–400)
RBC: 3.96 MIL/uL (ref 3.87–5.11)
RDW: 18 % — ABNORMAL HIGH (ref 11.5–15.5)
WBC: 6.1 10*3/uL (ref 4.0–10.5)
nRBC: 0 % (ref 0.0–0.2)

## 2020-12-09 LAB — MAGNESIUM: Magnesium: 2.1 mg/dL (ref 1.7–2.4)

## 2020-12-09 LAB — ECHOCARDIOGRAM COMPLETE
Area-P 1/2: 2.99 cm2
Height: 64 in
Radius: 0.4 cm
S' Lateral: 3.3 cm
Weight: 2400 oz

## 2020-12-09 LAB — MRSA PCR SCREENING: MRSA by PCR: NEGATIVE

## 2020-12-09 LAB — CBG MONITORING, ED
Glucose-Capillary: 112 mg/dL — ABNORMAL HIGH (ref 70–99)
Glucose-Capillary: 115 mg/dL — ABNORMAL HIGH (ref 70–99)

## 2020-12-09 LAB — HIV ANTIBODY (ROUTINE TESTING W REFLEX): HIV Screen 4th Generation wRfx: NONREACTIVE

## 2020-12-09 LAB — PHOSPHORUS: Phosphorus: 7.7 mg/dL — ABNORMAL HIGH (ref 2.5–4.6)

## 2020-12-09 MED ORDER — AMLODIPINE BESYLATE 10 MG PO TABS
10.0000 mg | ORAL_TABLET | Freq: Every evening | ORAL | Status: DC
  Administered 2020-12-09 – 2020-12-11 (×3): 10 mg via ORAL
  Filled 2020-12-09: qty 2
  Filled 2020-12-09 (×3): qty 1

## 2020-12-09 MED ORDER — INSULIN ASPART 100 UNIT/ML ~~LOC~~ SOLN
0.0000 [IU] | Freq: Three times a day (TID) | SUBCUTANEOUS | Status: DC
  Administered 2020-12-09: 1 [IU] via SUBCUTANEOUS
  Administered 2020-12-10: 2 [IU] via SUBCUTANEOUS
  Administered 2020-12-10: 3 [IU] via SUBCUTANEOUS
  Administered 2020-12-11: 2 [IU] via SUBCUTANEOUS
  Administered 2020-12-11: 1 [IU] via SUBCUTANEOUS
  Administered 2020-12-11: 2 [IU] via SUBCUTANEOUS
  Administered 2020-12-12 (×2): 1 [IU] via SUBCUTANEOUS

## 2020-12-09 MED ORDER — ACETAMINOPHEN 650 MG RE SUPP: 650.0000 mg | Freq: Four times a day (QID) | RECTAL | Status: DC | PRN

## 2020-12-09 MED ORDER — ACETAMINOPHEN 325 MG PO TABS: 650.0000 mg | ORAL_TABLET | Freq: Four times a day (QID) | ORAL | Status: DC | PRN

## 2020-12-09 MED ORDER — CARVEDILOL 12.5 MG PO TABS
12.5000 mg | ORAL_TABLET | Freq: Every day | ORAL | Status: DC
  Administered 2020-12-09 – 2020-12-10 (×3): 12.5 mg via ORAL
  Filled 2020-12-09 (×3): qty 1
  Filled 2020-12-09: qty 4

## 2020-12-09 MED ORDER — ONDANSETRON HCL 4 MG PO TABS
4.0000 mg | ORAL_TABLET | Freq: Four times a day (QID) | ORAL | Status: DC | PRN
  Administered 2020-12-11: 4 mg via ORAL
  Filled 2020-12-09 (×2): qty 1

## 2020-12-09 MED ORDER — ONDANSETRON HCL 4 MG/2ML IJ SOLN: 4.0000 mg | Freq: Four times a day (QID) | INTRAMUSCULAR | Status: DC | PRN

## 2020-12-09 MED ORDER — ATORVASTATIN CALCIUM 10 MG PO TABS
10.0000 mg | ORAL_TABLET | Freq: Every day | ORAL | Status: DC
  Administered 2020-12-09 – 2020-12-10 (×3): 10 mg via ORAL
  Filled 2020-12-09 (×4): qty 1

## 2020-12-09 MED ORDER — HEPARIN SODIUM (PORCINE) 1000 UNIT/ML IJ SOLN
INTRAMUSCULAR | Status: AC
  Administered 2020-12-10: 2000 [IU] via INTRAVENOUS_CENTRAL
  Filled 2020-12-09: qty 2

## 2020-12-09 MED ORDER — HEPARIN SODIUM (PORCINE) 5000 UNIT/ML IJ SOLN
5000.0000 [IU] | Freq: Three times a day (TID) | INTRAMUSCULAR | Status: DC
  Administered 2020-12-09 – 2020-12-12 (×7): 5000 [IU] via SUBCUTANEOUS
  Filled 2020-12-09 (×7): qty 1

## 2020-12-09 MED ORDER — POLYETHYLENE GLYCOL 3350 17 G PO PACK
17.0000 g | PACK | Freq: Every day | ORAL | Status: DC | PRN
  Administered 2020-12-10: 17 g via ORAL
  Filled 2020-12-09 (×2): qty 1

## 2020-12-09 MED ORDER — INSULIN GLARGINE 100 UNIT/ML ~~LOC~~ SOLN
5.0000 [IU] | Freq: Every day | SUBCUTANEOUS | Status: DC
  Administered 2020-12-09 – 2020-12-11 (×4): 5 [IU] via SUBCUTANEOUS
  Filled 2020-12-09 (×5): qty 0.05

## 2020-12-09 MED ORDER — CHLORHEXIDINE GLUCONATE CLOTH 2 % EX PADS
6.0000 | MEDICATED_PAD | Freq: Every day | CUTANEOUS | Status: DC
  Administered 2020-12-09: 6 via TOPICAL

## 2020-12-09 NOTE — Progress Notes (Signed)
Skin assessment done. Noted what looks like an abscess right Buttock. Rest of the skin dry but intact .

## 2020-12-09 NOTE — ED Notes (Signed)
Tele  Breakfast Ordered 

## 2020-12-09 NOTE — Plan of Care (Signed)
New admit from ED. AAOx4. Oriented to room, call light and telephone.

## 2020-12-09 NOTE — Progress Notes (Addendum)
Patient seen and examined.  Admitted by nighttime hospitalist early morning hours.  I interviewed patient.  I discussed with nephrology about need for additional dialysis today.  60 year old on Monday Wednesday Friday dialysis, presents with shortness of breath and currently requiring 3 L of oxygen with evidence of fluid overload due to inadequate dialysis.  Patient is planned for hemodialysis today in the hospital.  If she is able to weaned off the oxygen and remains stable after hemodialysis, will discharge patient so that she can keep up her outpatient schedule tomorrow at 1230.  Echo with mitral valve vegetations? No clinical evidence of infection. Draw blood cultures, will monitor for fever and order TEE if any

## 2020-12-09 NOTE — Consult Note (Signed)
[facility] Renal Consultation Note    Indication for Consultation:  Management of ESRD/hemodialysis, anemia, hypertension/volume, and secondary hyperparathyroidism.  HPI: Colleen [name] is a 60 y.o. female with PMH including ERSD on dialysis MWF, T2DM, HTN, hx DVT/PE, who initially presented to urgent care yesterday with fatigue and diarrhea, concerned for COVID 19. Covid test was negative but CXR showed "double pneumonia" and patient was hypoxic to 85% on RA, so she was referred to the ED. Patient reports she did not feel short of breath at any point but did notice fatigue with minimal activity. No CP, palpitations, orthopnea, or PND. She had been attending all her outpatient dialysis treatments and meeting/below her EDW, though she did sign off early most treatments. On presentation to the ED, x-ray consistent with interstitial edema. CT was negative for PE. Labs consistent with ESRD and mild anemia (Hgb 9.9) but otherwise unremarkable. Patient remained hypoxic. At time of exam, O2 sat dropped to mid 80's with [place] was briefly removed, though patient remained asymptomatic.Echocardiogram was done this AM, results pending.   Past Medical History:  Diagnosis Date  . Anemia   . Chronic kidney disease    ESRD [place] -  . Diabetes ([place])    type II  . DVT (deep venous thrombosis) ([place])   . E-coli UTI   . Gout 06/05/2018  . History of blood transfusion   . HLD (hyperlipidemia) 06/05/2018  . Hypertension   . Kidney failure   . PE (pulmonary embolism)   . Pulled muscle    pt has right sided facial droop from pulled muscle in face since birth   Past Surgical History:  Procedure Laterality Date  . A/V FISTULAGRAM Left 07/29/2019   Procedure: A/V FISTULAGRAM;  Surgeon: Elam Dutch, MD;  Location: [place] [facility];  Service: Cardiovascular;  Laterality: Left;  . ARTERY EXPLORATION Left 06/14/2019   Procedure: Artery Exploration Left Upper Arm;  Surgeon: Elam Dutch,  MD;  Location[facility];  Service: Vascular;  Laterality: Left;  . AV FISTULA PLACEMENT Left 12/31/2018   Procedure: ARTERIOVENOUS (AV) FISTULA CREATION VERSUS INSERTION OF ARTERIOVENOUS GRAFT LEFT ARM;  Surgeon: Elam Dutch, MD;  Location: [place];  Service: Vascular;  Laterality: Left;  . BASCILIC VEIN TRANSPOSITION Left 06/14/2019   Procedure: SECOND STAGE BASILIC VEIN TRANSPOSITION LEFT ARM;  Surgeon: Elam Dutch, MD;  Location: [place];  Service: Vascular;  Laterality: Left;  . BIOPSY  03/05/2020   Procedure: BIOPSY;  Surgeon: Lavena Bullion, DO;  Location: [facility];  Service: Gastroenterology;;  . COLONOSCOPY WITH PROPOFOL N/A 03/05/2020   Procedure: COLONOSCOPY WITH PROPOFOL;  Surgeon: Lavena Bullion, DO;  Location: [facility];  Service: Gastroenterology;  Laterality: N/A;  . EYE SURGERY     9 total eye surgery including laser and cararact surgery  . HEMOSTASIS CLIP PLACEMENT  03/05/2020   Procedure: HEMOSTASIS CLIP PLACEMENT;  Surgeon: Lavena Bullion, DO;  Location: [facility];  Service: Gastroenterology;;  . INSERTION OF DIALYSIS CATHETER N/A 12/31/2018   Procedure: INSERTION OF TUNNELED  DIALYSIS CATHETER;  Surgeon: Elam Dutch, MD;  Location: [facility] OR;  Service: Vascular;  Laterality: N/A;  . PERIPHERAL VASCULAR BALLOON ANGIOPLASTY Left 07/29/2019   Procedure: PERIPHERAL VASCULAR BALLOON ANGIOPLASTY;  Surgeon: Elam Dutch, MD;  Location: [place] [facility];  Service: Cardiovascular;  Laterality: Left;  ARM FISTULA  . POLYPECTOMY  03/05/2020   Procedure: POLYPECTOMY;  Surgeon: Lavena Bullion, DO;  Location: [facility];  Service: Gastroenterology;;   Family History  Problem Relation Age of Onset  . CAD Neg Hx   . Colon cancer Neg Hx   . Esophageal cancer Neg Hx   . Cancer Neg Hx    Social History:  reports that she has never smoked. She has never used smokeless tobacco. She reports that she does not drink alcohol and does not use drugs.  ROS: As  per HPI otherwise negative.  Physical Exam: Vitals:   12/09/20 0330 12/09/20 0430 12/09/20 0500 12/09/20 0530  BP: (!) 143/80 (!) 134/59 (!) 137/57 (!) 137/57  Pulse: 76 79 78 78  Resp: 20 15 14 12   Temp:      TempSrc:      SpO2: 96% 96% 95% 93%  Weight:      Height:         General: Well developed, well nourished, in no acute distress. Head: Normocephalic, atraumatic, sclera non-icteric, mucus membranes are moist. Neck:  JVD not elevated. Lungs: Scattered rales bilateral lower lobes. Breathing is unlabored on O2 2L. Heart: RRR with normal S1, S2. No murmurs, rubs, or gallops appreciated. Abdomen: Soft, non-tender, non-distended with normoactive bowel sounds. No rebound/guarding. No obvious abdominal masses. Musculoskeletal:  Strength and tone appear normal for age. Lower extremities: No edema or ischemic changes, no open wounds. Neuro: Alert and oriented X 3. Moves all extremities spontaneously. Psych:  Responds to questions appropriately with a normal affect. Dialysis Access: AVF + t/b  Allergies  Allergen Reactions  . Dapagliflozin Other (See Comments)    Cannot take due to kidneys  . Hydrocodone Other (See Comments)    "Increase Glucose level," per pt  . Kombiglyze Xr [Saxagliptin-Metformin Er] Other (See Comments)    Cannot take due to kidneys   Prior to Admission medications   Medication Sig Start Date End Date Taking? Authorizing Provider  atorvastatin (LIPITOR) 10 MG tablet Take 10 mg by mouth at bedtime.   Yes [provider]  carvedilol (COREG) 12.5 MG tablet Take 1 tablet (12.5 mg total) by mouth 2 (two) times daily with a meal for 21 days. Patient taking differently: Take 12.5 mg by mouth at bedtime. 12/06/18 12/31/26 Yes Volanda Napoleon, PA-C  HUMALOG KWIKPEN 100 UNIT/ML KwikPen Inject 1-3 Units into the skin See admin instructions. Inject 1-3 units into the skin once a day, PER SLIDING SCALE 12/27/19  Yes [provider]  Insulin Glargine  (BASAGLAR KWIKPEN) 100 UNIT/ML Inject 10 Units into the skin at bedtime. 10/07/19  Yes [provider]  lanthanum (FOSRENOL) 1000 MG chewable tablet Chew 1,000 mg by mouth 3 (three) times daily with meals.   Yes [provider]  amLODipine (NORVASC) 10 MG tablet Take 1 tablet (10 mg total) by mouth daily for 21 days. Patient taking differently: No sig reported 12/06/18 12/31/26  Providence Lanius A, PA-C  ferric citrate (AURYXIA) 1 GM 210 MG(Fe) tablet Take 630 mg by mouth 3 (three) times daily with meals.     [provider]  lidocaine-prilocaine (EMLA) cream Apply 1 application topically every Monday, Wednesday, and Friday with hemodialysis. 01/26/20   [provider]   Current Facility-Administered Medications  Medication Dose Route Frequency Provider Last Rate Last Admin  . acetaminophen (TYLENOL) tablet 650 mg  650 mg Oral Q6H PRN Shalhoub, Sherryll Burger, MD       Or  . acetaminophen (TYLENOL) suppository 650 mg  650 mg Rectal Q6H PRN Shalhoub, Sherryll Burger, MD      . albuterol (VENTOLIN  HFA) 108 (90 Base) MCG/ACT inhaler 2 puff  2 puff Inhalation Q2H PRN Shalhoub, Sherryll Burger, MD      . amLODipine (NORVASC) tablet 10 mg  10 mg Oral QPM Shalhoub, Sherryll Burger, MD   10 mg at 12/09/20 0141  . atorvastatin (LIPITOR) tablet 10 mg  10 mg Oral QHS Shalhoub, Sherryll Burger, MD   10 mg at 12/09/20 0141  . carvedilol (COREG) tablet 12.5 mg  12.5 mg Oral QHS Shalhoub, Sherryll Burger, MD   12.5 mg at 12/09/20 0140  . heparin injection 5,000 Units  5,000 Units Subcutaneous Q8H Shalhoub, Sherryll Burger, MD      . insulin aspart (novoLOG) injection 0-6 Units  0-6 Units Subcutaneous TID AC & HS Shalhoub, Sherryll Burger, MD      . insulin glargine (LANTUS) injection 5 Units  5 Units Subcutaneous QHS Vernelle Emerald, MD   5 Units at 12/09/20 [phone]  . ondansetron (ZOFRAN) tablet 4 mg  4 mg Oral Q6H PRN Shalhoub, Sherryll Burger, MD       Or  . ondansetron [facility]) injection 4 mg  4 mg Intravenous Q6H PRN Shalhoub, Sherryll Burger, MD      . polyethylene glycol (MIRALAX / GLYCOLAX) packet 17 g  17 g Oral Daily PRN Shalhoub, Sherryll Burger, MD       Current Outpatient Medications  Medication Sig Dispense Refill  . atorvastatin (LIPITOR) 10 MG tablet Take 10 mg by mouth at bedtime.    . carvedilol (COREG) 12.5 MG tablet Take 1 tablet (12.5 mg total) by mouth 2 (two) times daily with a meal for 21 days. (Patient taking differently: Take 12.5 mg by mouth at bedtime.) 42 tablet 0  . HUMALOG KWIKPEN 100 UNIT/ML KwikPen Inject 1-3 Units into the skin See admin instructions. Inject 1-3 units into the skin once a day, PER SLIDING SCALE    . Insulin Glargine (BASAGLAR KWIKPEN) 100 UNIT/ML Inject 10 Units into the skin at bedtime.    Marland Kitchen lanthanum (FOSRENOL) 1000 MG chewable tablet Chew 1,000 mg by mouth 3 (three) times daily with meals.    Marland Kitchen amLODipine (NORVASC) 10 MG tablet Take 1 tablet (10 mg total) by mouth daily for 21 days. (Patient taking differently: No sig reported) 21 tablet 0  . ferric citrate (AURYXIA) 1 GM 210 MG(Fe) tablet Take 630 mg by mouth 3 (three) times daily with meals.     . lidocaine-prilocaine (EMLA) cream Apply 1 application topically every Monday, Wednesday, and Friday with hemodialysis.     Labs: Basic Metabolic Panel: Recent Labs  Lab 12/08/20 1702 12/09/20 0300  NA 137 137  K 3.9 3.9  CL 94* 95*  CO2 29 28  GLUCOSE 256* 194*  BUN 28* 34*  CREATININE 8.50* 9.52*  CALCIUM 9.2 9.3  PHOS  --  7.7*   Liver Function Tests: Recent Labs  Lab 12/09/20 0300  AST 10*  ALT 8  ALKPHOS 66  BILITOT 0.5  PROT 6.6  ALBUMIN 2.6*   CBC: Recent Labs  Lab 12/08/20 1702 12/09/20 0300  WBC 6.4 6.1  NEUTROABS  --  4.3  HGB 10.5* 9.9*  HCT 34.1* 32.5*  MCV 81.6 82.1  PLT 177 156   CBG: Recent Labs  Lab 12/08/20 2114 12/09/20 0807  GLUCAP 186* 112*   Iron Studies: No results for input(s): IRON, TIBC, TRANSFERRIN, FERRITIN in the last 72 hours. Studies/Results: CT Angio Chest PE W and/or Wo  Contrast  Result Date: 12/08/2020 CLINICAL DATA:  Shortness of  breath EXAM: CT ANGIOGRAPHY CHEST WITH CONTRAST TECHNIQUE: Multidetector CT imaging of the chest was performed using the standard protocol during bolus administration of intravenous contrast. Multiplanar CT image reconstructions and MIPs were obtained to evaluate the vascular anatomy. CONTRAST:  176mL OMNIPAQUE IOHEXOL 350 MG/ML SOLN COMPARISON:  None. FINDINGS: Cardiovascular: There is a optimal opacification of the pulmonary arteries. There is no central,segmental, or subsegmental filling defects within the pulmonary arteries. The heart is normal in size. There is a small pericardial effusion present. No evidence right heart strain. There is normal three-vessel brachiocephalic anatomy without proximal stenosis. Aortic atherosclerosis noted. Coronary artery calcifications are seen. Mediastinum/Nodes: No hilar, mediastinal, or axillary adenopathy. Thyroid gland, trachea, and esophagus demonstrate no significant findings. Lungs/Pleura: There is a right and trace effusion present. Patchy airspace opacity seen at the posterior right base. Interlobular septal thickening seen within. Upper Abdomen: No acute abnormalities present in the visualized portions of the upper abdomen. Musculoskeletal: No chest wall abnormality. No acute or significant osseous findings. Review of the MIP images confirms the above findings. IMPRESSION: No central, segmental subsegmental. Small right and trace left effusion. Findings which may be suggestive interstitial edema. Aortic Atherosclerosis (ICD10-I70.0). Electronically Signed   By: Prudencio Pair M.D.   On: 12/08/2020 21:59   DG Chest Portable 1 View  Result Date: 12/08/2020 CLINICAL DATA:  Shortness of breath possible COVID EXAM: PORTABLE CHEST 1 VIEW COMPARISON:  12/31/2018 FINDINGS: Cardiomegaly with vascular congestion and mild interstitial pulmonary edema. Small pleural effusions. Aortic atherosclerosis. No  pneumothorax. IMPRESSION: Cardiomegaly with vascular congestion, mild interstitial edema and small pleural effusions. Electronically Signed   By: Donavan Foil M.D.   On: 12/08/2020 18:12    Dialysis Orders:  Center: [facility] on MWF. MonWedFri, 3 hrs 15 min, 180NRe Optiflux, BFR 350, DFR Manual 800 mL/min, EDW 68.5 (kg), Dialysate 2.0 K, 2.25 Ca, AVF Heparin 2000 unit bolus Sensipar 120 mg q HD  Hectorol 4 mcg IV q HD Mircera 139mcg IV q 2 weeks- last dose 12/29  Attends every treatment but usually signs off early. Leaving slightly below EDW  Assessment/Plan: 1.  Acute hypoxic respiratory failure: CXR consistent with pulmonary edema. Leaving below outpatient EDW- likely lost some body weight and needs dry weight lowered. Will plan for HD tonight for volume removal as tolerated. Most likely can d/c after dialysis if O2 saturation is >90% on RA 2.  ESRD: Dialyzes MWF schedule, will plan for HD tonight/tomorrow AM as above. Encouraged to stay for full HD treatments.  3.  Hypertension/volume: BP fairly well controlled. CXR consistent with edema. UFG 3L with HD. 4.  Anemia: Hgb 9.9, ESA recently dosed.  5.  Metabolic bone disease: CorrCa slightly high. Continue sensipar but will decrease hectorol dose. Outpatient phos runs high- reportedly was out of binders for awhile. Resume renvela 3 tabs with meal  Anice Paganini, PA-C 12/09/2020, 9:28 AM  [facility] Pager: [phone]

## 2020-12-09 NOTE — H&P (Signed)
History and Physical    Colleen [name]:[id number] DOB: Nov 22, 1961 DOA: 12/08/2020  PCP: [name], DO  Patient coming from: Urgent Care clinic   Chief Complaint:  Chief Complaint  Patient presents with  . hypoxia     HPI: 60 year old female with past medical history of end-stage renal disease (MWF HD), diabetes mellitus type 2, hypertension, anemia of chronic disease who presents to [facility] emergency department after being instructed to come to the emergency department by a local urgent care clinic.  Patient explains that for approximately the past 4 to 5 days the patient has been experiencing intermittent nausea vomiting and diarrhea.  Patient describes she has been having more than 2 episodes of watery diarrhea daily alongside 1-2 episodes of nausea daily.  Appetite has been poor.  Patient is also complaining of associated dry nonproductive cough.  With this constellation of symptoms, patient began to develop progressively worsening weakness.  Patient reports decreased appetite over the span of time.  Patient eventually presented to a local urgent care clinic for evaluation.  Upon evaluation in the urgent care clinic patient was found to be hypoxic with oxygen saturations in the 70s.  Patient was therefore sent to the emergency department for further evaluation.  Upon evaluation in the emergency department patient has been found to have persistent oxygen requirement for which patient was placed on nasal cannula.  CT angiogram of the chest revealed no evidence of pulmonary embolism but did reveal evidence of interstitial edema with a small right pleural effusion and sided pleural effusion.  The hospitalist group was then called to assess the patient for admission to the hospital.   Review of Systems:   ROS  Past Medical History:  Diagnosis Date  . Anemia   . Chronic kidney disease    ESRD [place] -  . Diabetes ([place])    type II  . DVT (deep venous thrombosis) ([place])    . E-coli UTI   . Gout 06/05/2018  . History of blood transfusion   . HLD (hyperlipidemia) 06/05/2018  . Hypertension   . Kidney failure   . PE (pulmonary embolism)   . 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 A/V FISTULAGRAM Left 07/29/2019   Procedure: A/V FISTULAGRAM;  Surgeon: Elam Dutch, MD;  Location: [place] CV LAB;  Service: Cardiovascular;  Laterality: Left;  . ARTERY EXPLORATION Left 06/14/2019   Procedure: Artery Exploration Left Upper Arm;  Surgeon: Elam Dutch, MD;  Location: [facility] OR;  Service: Vascular;  Laterality: Left;  . AV FISTULA PLACEMENT Left 12/31/2018   Procedure: ARTERIOVENOUS (AV) FISTULA CREATION VERSUS INSERTION OF ARTERIOVENOUS GRAFT LEFT ARM;  Surgeon: Elam Dutch, MD;  Location: [place];  Service: Vascular;  Laterality: Left;  . BASCILIC VEIN TRANSPOSITION Left 06/14/2019   Procedure: SECOND STAGE BASILIC VEIN TRANSPOSITION LEFT ARM;  Surgeon: Elam Dutch, MD;  Location: [place];  Service: Vascular;  Laterality: Left;  . BIOPSY  03/05/2020   Procedure: BIOPSY;  Surgeon: Lavena Bullion, DO;  Location: [facility];  Service: Gastroenterology;;  . COLONOSCOPY WITH PROPOFOL N/A 03/05/2020   Procedure: COLONOSCOPY WITH PROPOFOL;  Surgeon: Lavena Bullion, DO;  Location: [facility];  Service: Gastroenterology;  Laterality: N/A;  . EYE SURGERY     9 total eye surgery including laser and cararact surgery  . HEMOSTASIS CLIP PLACEMENT  03/05/2020   Procedure: HEMOSTASIS CLIP PLACEMENT;  Surgeon: Lavena Bullion, DO;  Location: [facility];  Service: Gastroenterology;;  . INSERTION OF DIALYSIS CATHETER N/A 12/31/2018   Procedure: INSERTION OF TUNNELED  DIALYSIS CATHETER;  Surgeon: Elam Dutch, MD;  Location: [facility] OR;  Service: Vascular;  Laterality: N/A;  . PERIPHERAL VASCULAR BALLOON ANGIOPLASTY Left 07/29/2019   Procedure:  PERIPHERAL VASCULAR BALLOON ANGIOPLASTY;  Surgeon: Elam Dutch, MD;  Location: [place] CV LAB;  Service: Cardiovascular;  Laterality: Left;  ARM FISTULA  . POLYPECTOMY  03/05/2020   Procedure: POLYPECTOMY;  Surgeon: Lavena Bullion, DO;  Location: [facility];  Service: Gastroenterology;;     reports that she has never smoked. She has never used smokeless tobacco. She reports that she does not drink alcohol and does not use drugs.  Allergies  Allergen Reactions  . Dapagliflozin Other (See Comments)    Cannot take due to kidneys  . Hydrocodone Other (See Comments)    "Increase Glucose level," per pt  . Kombiglyze Xr [Saxagliptin-Metformin Er] Other (See Comments)    Cannot take due to kidneys    Family History  Problem Relation Age of Onset  . CAD Neg Hx   . Colon cancer Neg Hx   . Esophageal cancer Neg Hx   . Cancer Neg Hx      Prior to Admission medications   Medication Sig Start Date End Date Taking? Authorizing Provider  atorvastatin (LIPITOR) 10 MG tablet Take 10 mg by mouth at bedtime.   Yes [provider]  carvedilol (COREG) 12.5 MG tablet Take 1 tablet (12.5 mg total) by mouth 2 (two) times daily with a meal for 21 days. Patient taking differently: Take 12.5 mg by mouth at bedtime. 12/06/18 12/31/26 Yes Volanda Napoleon, PA-C  HUMALOG KWIKPEN 100 UNIT/ML KwikPen Inject 1-3 Units into the skin See admin instructions. Inject 1-3 units into the skin once a day, PER SLIDING SCALE 12/27/19  Yes [provider]  Insulin Glargine (BASAGLAR KWIKPEN) 100 UNIT/ML Inject 10 Units into the skin at bedtime. 10/07/19  Yes [provider]  lanthanum (FOSRENOL) 1000 MG chewable tablet Chew 1,000 mg by mouth 3 (three) times daily with meals.   Yes [provider]  amLODipine (NORVASC) 10 MG tablet Take 1 tablet (10 mg total) by mouth daily for 21 days. Patient taking differently: No sig reported 12/06/18 12/31/26  [place]idence Lanius A, PA-C   ferric citrate (AURYXIA) 1 GM 210 MG(Fe) tablet Take 630 mg by mouth 3 (three) times daily with meals.     [provider]  lidocaine-prilocaine (EMLA) cream Apply 1 application topically every Monday, Wednesday, and Friday with hemodialysis. 01/26/20   [provider]    Physical Exam: Vitals:   12/09/20 0330 12/09/20 0430 12/09/20 0500 12/09/20 0530  BP: (!) 143/80 (!) 134/59 (!) 137/57 (!) 137/57  Pulse: 76 79 78 78  Resp: 20 15 14 12   Temp:      TempSrc:      SpO2: 96% 96% 95% 93%  Weight:      Height:         Constitutional: Acute alert and oriented x3, no associated distress.   Skin: no rashes, no lesions, good skin turgor noted. Eyes: Pupils are equally reactive to light.  No evidence of scleral icterus or conjunctival pallor.  ENMT: Moist mucous membranes noted.  Posterior pharynx clear of any exudate or lesions.   Neck: normal, supple, no masses, no thyromegaly.  No evidence  of jugular venous distension.   Respiratory: cbibasilar rales noted.   no wheezing. Normal respiratory effort. No accessory muscle use.  Cardiovascular: Regular rate and rhythm, no murmurs / rubs / gallops. No extremity edema. 2+ pedal pulses. No carotid bruits.  Chest:   Nontender without crepitus or deformity.   Back:   Nontender without crepitus or deformity. Abdomen: Abdomen is soft and nontender.  No evidence of intra-abdominal masses.  Positive bowel sounds noted in all quadrants.   Musculoskeletal: No joint deformity upper and lower extremities. Good ROM, no contractures. Normal muscle tone.  Neurologic: CN 2-12 grossly intact. Sensation intact.  Patient moving all 4 extremities spontaneously.  Patient is following all commands.  Patient is responsive to verbal stimuli.   Psychiatric: Patient exhibits normal mood with appropriate affect.  Patient seems to possess insight as to their current situation.     Labs on Admission: I have personally reviewed following labs and imaging  studies -   CBC: Recent Labs  Lab 12/08/20 1702  WBC 6.4  HGB 10.5*  HCT 34.1*  MCV 81.6  PLT 161   Basic Metabolic Panel: Recent Labs  Lab 12/08/20 1702 12/09/20 0300  NA 137 137  K 3.9 3.9  CL 94* 95*  CO2 29 28  GLUCOSE 256* 194*  BUN 28* 34*  CREATININE 8.50* 9.52*  CALCIUM 9.2 9.3  MG  --  2.1  PHOS  --  7.7*   GFR: Estimated Creatinine Clearance: 6.1 mL/min (A) (by C-G formula based on SCr of 9.52 mg/dL (H)). Liver Function Tests: Recent Labs  Lab 12/09/20 0300  AST 10*  ALT 8  ALKPHOS 66  BILITOT 0.5  PROT 6.6  ALBUMIN 2.6*   No results for input(s): LIPASE, AMYLASE in the last 168 hours. No results for input(s): AMMONIA in the last 168 hours. Coagulation Profile: No results for input(s): INR, PROTIME in the last 168 hours. Cardiac Enzymes: No results for input(s): CKTOTAL, CKMB, CKMBINDEX, TROPONINI in the last 168 hours. BNP (last 3 results) No results for input(s): PROBNP in the last 8760 hours. HbA1C: No results for input(s): HGBA1C in the last 72 hours. CBG: Recent Labs  Lab 12/08/20 2114  GLUCAP 186*   Lipid Profile: No results for input(s): CHOL, HDL, LDLCALC, TRIG, CHOLHDL, LDLDIRECT in the last 72 hours. Thyroid Function Tests: No results for input(s): TSH, T4TOTAL, FREET4, T3FREE, THYROIDAB in the last 72 hours. Anemia Panel: No results for input(s): VITAMINB12, FOLATE, FERRITIN, TIBC, IRON, RETICCTPCT in the last 72 hours. Urine analysis:    Component Value Date/Time   COLORURINE YELLOW 06/15/2019 0953   APPEARANCEUR HAZY (A) 06/15/2019 0953   LABSPEC 1.014 06/15/2019 0953   PHURINE 8.0 06/15/2019 0953   GLUCOSEU >=500 (A) 06/15/2019 0953   HGBUR NEGATIVE 06/15/2019 0953   BILIRUBINUR NEGATIVE 06/15/2019 0953   KETONESUR NEGATIVE 06/15/2019 0953   PROTEINUR >=300 (A) 06/15/2019 0953   UROBILINOGEN 0.2 09/22/2013 0803   NITRITE NEGATIVE 06/15/2019 0953   LEUKOCYTESUR TRACE (A) 06/15/2019 0953    Radiological Exams on  Admission - Personally Reviewed: CT Angio Chest PE W and/or Wo Contrast  Result Date: 12/08/2020 CLINICAL DATA:  Shortness of breath EXAM: CT ANGIOGRAPHY CHEST WITH CONTRAST TECHNIQUE: Multidetector CT imaging of the chest was performed using the standard protocol during bolus administration of intravenous contrast. Multiplanar CT image reconstructions and MIPs were obtained to evaluate the vascular anatomy. CONTRAST:  138mL OMNIPAQUE IOHEXOL 350 MG/ML SOLN COMPARISON:  None. FINDINGS: Cardiovascular: There is a optimal opacification  of the pulmonary arteries. There is no central,segmental, or subsegmental filling defects within the pulmonary arteries. The heart is normal in size. There is a small pericardial effusion present. No evidence right heart strain. There is normal three-vessel brachiocephalic anatomy without proximal stenosis. Aortic atherosclerosis noted. Coronary artery calcifications are seen. Mediastinum/Nodes: No hilar, mediastinal, or axillary adenopathy. Thyroid gland, trachea, and esophagus demonstrate no significant findings. Lungs/Pleura: There is a right and trace effusion present. Patchy airspace opacity seen at the posterior right base. Interlobular septal thickening seen within. Upper Abdomen: No acute abnormalities present in the visualized portions of the upper abdomen. Musculoskeletal: No chest wall abnormality. No acute or significant osseous findings. Review of the MIP images confirms the above findings. IMPRESSION: No central, segmental subsegmental. Small right and trace left effusion. Findings which may be suggestive interstitial edema. Aortic Atherosclerosis (ICD10-I70.0). Electronically Signed   By: Prudencio Pair M.D.   On: 12/08/2020 21:59   DG Chest Portable 1 View  Result Date: 12/08/2020 CLINICAL DATA:  Shortness of breath possible COVID EXAM: PORTABLE CHEST 1 VIEW COMPARISON:  12/31/2018 FINDINGS: Cardiomegaly with vascular congestion and mild interstitial pulmonary edema.  Small pleural effusions. Aortic atherosclerosis. No pneumothorax. IMPRESSION: Cardiomegaly with vascular congestion, mild interstitial edema and small pleural effusions. Electronically Signed   By: Donavan Foil M.D.   On: 12/08/2020 18:12    EKG: Personally reviewed.  Rhythm is normal sinus rhythm with heart rate of 86 bpm.  No dynamic ST segment changes appreciated.  Assessment/Plan Principal Problem:   Acute respiratory failure with hypoxia [facility])  Patient presenting with evidence of acute hypoxia requiring supplemental oxygen via nasal cannula   Presentation likely secondary to development of acute pulmonary edema secondary to volume overload.  Will contact nephrology in the morning for consideration of additional dialysis session on 1/2 to help remove this excess fluid.  Continue to provide submental oxygen via nasal cannula  Additionally obtaining echocardiogram to ensure that there is no superimposed congestive heart failure.  No clear unsuccessful in weaning patient off of supplemental oxygen after dialysis, will obtain ABG.  CT angiogram of the chest performed in the emergency department reveals no evidence of pulmonary embolism or pneumonia.  Active Problems:   Acute pulmonary edema (HCC)   Please see assessment and plan above    Nausea vomiting and diarrhea   Patient complaining of a several day history of intermittent nausea vomiting and diarrhea.  Patient reports that in the past 24 hours or so symptoms seem to be spontaneously subsided  Possible viral gastroenteritis  Conservative management, supportive care    Essential hypertension   Continue home regimen of antihypertensive therapy  As needed intravenous antihypertensives for markedly elevated blood pressures.    Type 2 diabetes mellitus with hyperglycemia, with long-term current use of insulin (HCC)  Accu-Cheks before every meal and nightly with sliding scale insulin  Due to tenuous oral intake will  place patient on reduced regimen of basal insulin therapy    Anemia, chronic disease   Hemoglobin and hematocrit near baseline  No clinical evidence of bleeding  Continue to monitor hemoglobin and hematocrit with serial CBCs    ESRD on hemodialysis [facility])    Patient undergoes hemodialysis Monday Wednesday Friday  That being said, considering evidence of pulmonary edema and bilateral pleural effusions patient may benefit from additional hemodialysis session.  We will contact nephrology in the morning on day shift for consideration of additional session of hemodialysis on 1/2.   Code Status:  Full code Family  Communication: Deferred  Status is: Observation  The patient remains OBS appropriate and will d/c before 2 midnights.  Dispo: The patient is from: Home              Anticipated d/c is to: Home              Anticipated d/c date is: 2 days              Patient currently is not medically stable to d/c.        Vernelle Emerald MD [facility] Pager [phone]  If 7PM-7AM, please contact night-coverage [email or web address] Use universal [place] password for that web site. If you do not have the password, please call the hospital operator.  12/09/2020, 6:53 AM

## 2020-12-09 NOTE — Progress Notes (Signed)
  Echocardiogram 2D Echocardiogram has been performed.  Colleen [name] 12/09/2020, 8:54 AM

## 2020-12-10 DIAGNOSIS — N186 End stage renal disease: Secondary | ICD-10-CM | POA: Diagnosis not present

## 2020-12-10 DIAGNOSIS — I120 Hypertensive chronic kidney disease with stage 5 chronic kidney disease or end stage renal disease: Secondary | ICD-10-CM | POA: Diagnosis not present

## 2020-12-10 DIAGNOSIS — D638 Anemia in other chronic diseases classified elsewhere: Secondary | ICD-10-CM | POA: Diagnosis not present

## 2020-12-10 DIAGNOSIS — J810 Acute pulmonary edema: Secondary | ICD-10-CM | POA: Diagnosis not present

## 2020-12-10 DIAGNOSIS — E1122 Type 2 diabetes mellitus with diabetic chronic kidney disease: Secondary | ICD-10-CM | POA: Diagnosis not present

## 2020-12-10 DIAGNOSIS — Z992 Dependence on renal dialysis: Secondary | ICD-10-CM | POA: Diagnosis not present

## 2020-12-10 DIAGNOSIS — J9601 Acute respiratory failure with hypoxia: Secondary | ICD-10-CM | POA: Diagnosis not present

## 2020-12-10 DIAGNOSIS — I10 Essential (primary) hypertension: Secondary | ICD-10-CM | POA: Diagnosis not present

## 2020-12-10 LAB — HEPATITIS B SURFACE ANTIBODY,QUALITATIVE: Hep B S Ab: REACTIVE — AB

## 2020-12-10 LAB — CBC
HCT: 32.9 % — ABNORMAL LOW (ref 36.0–46.0)
Hemoglobin: 10.3 g/dL — ABNORMAL LOW (ref 12.0–15.0)
MCH: 25.4 pg — ABNORMAL LOW (ref 26.0–34.0)
MCHC: 31.3 g/dL (ref 30.0–36.0)
MCV: 81.2 fL (ref 80.0–100.0)
Platelets: 181 10*3/uL (ref 150–400)
RBC: 4.05 MIL/uL (ref 3.87–5.11)
RDW: 18.1 % — ABNORMAL HIGH (ref 11.5–15.5)
WBC: 7.3 10*3/uL (ref 4.0–10.5)
nRBC: 0 % (ref 0.0–0.2)

## 2020-12-10 LAB — GLUCOSE, CAPILLARY
Glucose-Capillary: 226 mg/dL — ABNORMAL HIGH (ref 70–99)
Glucose-Capillary: 170 mg/dL — ABNORMAL HIGH (ref 70–99)
Glucose-Capillary: 121 mg/dL — ABNORMAL HIGH (ref 70–99)
Glucose-Capillary: 96 mg/dL (ref 70–99)
Glucose-Capillary: 270 mg/dL — ABNORMAL HIGH (ref 70–99)

## 2020-12-10 LAB — RENAL FUNCTION PANEL
Albumin: 2.6 g/dL — ABNORMAL LOW (ref 3.5–5.0)
Anion gap: 13 (ref 5–15)
BUN: 22 mg/dL — ABNORMAL HIGH (ref 6–20)
CO2: 27 mmol/L (ref 22–32)
Calcium: 9.4 mg/dL (ref 8.9–10.3)
Chloride: 96 mmol/L — ABNORMAL LOW (ref 98–111)
Creatinine, Ser: 7.17 mg/dL — ABNORMAL HIGH (ref 0.44–1.00)
GFR, Estimated: 6 mL/min — ABNORMAL LOW (ref >60)
Glucose, Bld: 167 mg/dL — ABNORMAL HIGH (ref 70–99)
Phosphorus: 5.7 mg/dL — ABNORMAL HIGH (ref 2.5–4.6)
Potassium: 3.9 mmol/L (ref 3.5–5.1)
Sodium: 136 mmol/L (ref 135–145)

## 2020-12-10 LAB — HEPATITIS B SURFACE ANTIGEN: Hepatitis B Surface Ag: NONREACTIVE

## 2020-12-10 LAB — HEMOGLOBIN A1C
Hgb A1c MFr Bld: 7.4 % — ABNORMAL HIGH (ref 4.8–5.6)
Mean Plasma Glucose: 166 mg/dL

## 2020-12-10 LAB — HEPATITIS B CORE ANTIBODY, TOTAL: Hep B Core Total Ab: NONREACTIVE

## 2020-12-10 MED ORDER — HEPARIN SODIUM (PORCINE) 1000 UNIT/ML DIALYSIS: 2000.0000 [IU] | Freq: Once | INTRAMUSCULAR | Status: AC

## 2020-12-10 NOTE — Progress Notes (Signed)
Patient was back from HD At 0100 on O2 @3L  [place] with saturation of 94%. Tried to wean off  O2,saturation dropped to 84-85% in few minutes. Put back on O2, adjusted the liter to 1L, saturating at 91-92%. Will continue to monitor patient.

## 2020-12-10 NOTE — Progress Notes (Signed)
Patient ambulated to the bathroom on room air, returned to bed and O2 76, put on 1 Liter,  O2 increased to 90 <30seconds, up to 97 O2 in less than 1 minute on 1L.  Patient remains on 1 Liter nasal cannula.

## 2020-12-10 NOTE — Plan of Care (Signed)
  Problem: Activity: Goal: Risk for activity intolerance will decrease Outcome: Progressing   Problem: Nutrition: Goal: Adequate nutrition will be maintained Outcome: Progressing   

## 2020-12-10 NOTE — Plan of Care (Signed)
  Problem: Education: Goal: Knowledge of General Education information will improve Description Including pain rating scale, medication(s)/side effects and non-pharmacologic comfort measures Outcome: Progressing   Problem: Health Behavior/Discharge Planning: Goal: Ability to manage health-related needs will improve Outcome: Progressing   

## 2020-12-10 NOTE — Progress Notes (Signed)
PROGRESS NOTE    Colleen [name]  JYN:[id number] DOB: 05/15/1961 DOA: 12/08/2020 PCP: [name], DO    Brief Narrative:   60 year old female with past medical history of end-stage renal disease (MWF HD), diabetes mellitus type 2, hypertension, anemia of chronic disease who presents to [facility] with nausea, diarrhea, cough  Assessment & Plan:   Principal Problem:   Acute respiratory failure with hypoxia ([place]) Active Problems:   Essential hypertension   Type 2 diabetes mellitus with hyperglycemia, with long-term current use of insulin (HCC)   Acute pulmonary edema (HCC)   Anemia, chronic disease   Nausea vomiting and diarrhea   ESRD on hemodialysis (HCC)   Fluid overload  Principal Problem:   Acute respiratory failure with hypoxia ([place])  Patient presenting with evidence of acute hypoxia requiring supplemental oxygen via nasal cannula   Presentation likely secondary to development of acute pulmonary edema secondary to volume overload.  Nephrology consulted and pt underwent HD on 1/2 and again on 1/3  CT angiogram of the chest performed in the emergency department reveals no evidence of pulmonary embolism or pneumonia.  Post-HD, pt remains on supplemental O2. Wean as tolerated. Cont plan per Nephrology  Active Problems:   Acute pulmonary edema (HCC)    Nausea vomiting and diarrhea  Patient complaining of a several day history of intermittent nausea vomiting and diarrhea.  Patient reports that in the past 24 hours or so symptoms seem to be spontaneously subsided  Suspect possible viral gastroenteritis  Conservative management, supportive care    Essential hypertension  Continue home regimen of antihypertensive therapy  As needed intravenous antihypertensives for markedly elevated blood pressures.    Type 2 diabetes mellitus with hyperglycemia, with long-term current use of insulin (HCC)  Accu-Cheks before every meal and nightly with sliding scale  insulin  Continue insulin as needed    Anemia, chronic disease  Hemoglobin and hematocrit noted to be near baseline  No clinical evidence of bleeding  Continue to monitor hemoglobin and hematocrit with serial CBCs    ESRD on hemodialysis [facility])   Patient undergoes hemodialysis Monday Wednesday Friday  Nephrology following. Underwent HD 1/2 and again 1/3  Per Nephrology, plan to resume scheduled HD  DVT prophylaxis: Heparin subq Code Status: Full Family Communication: Pt in room, family not at bedside  Status is: Inpatient  Remains inpatient appropriate because:Unsafe d/c plan   Dispo:  Patient From: Home  Planned Disposition: Home  Expected discharge date: 12/10/2020  Medically stable for discharge: No        Consultants:     Procedures:     Antimicrobials: Anti-infectives (From admission, onward)   None       Subjective: Reports feeling better, still needing O2  Objective: Vitals:   12/10/20 0017 12/10/20 0032 12/10/20 0446 12/10/20 0917  BP: (!) 150/74 (!) 136/46 136/61 (!) 139/50  Pulse: 88 93 85 88  Resp: 19   17  Temp: 98.7 F (37.1 C) 99.2 F (37.3 C) 99.7 F (37.6 C) 98 F (36.7 C)  TempSrc: Oral  Oral   SpO2: 98% 91% 92% 90%  Weight:      Height:        Intake/Output Summary (Last 24 hours) at 12/10/2020 1004 Last data filed at 12/10/2020 0800 Gross per 24 hour  Intake 580 ml  Output 3000 ml  Net -2420 ml   Filed Weights   12/08/20 1738 12/09/20 2030  Weight: 68 kg 68.7 kg    Examination:  General exam:  Appears calm and comfortable  Respiratory system: Clear to auscultation. Respiratory effort normal. Cardiovascular system: S1 & S2 heard, Regular Gastrointestinal system: Abdomen is nondistended, soft and nontender. No organomegaly or masses felt. Normal bowel sounds heard. Central nervous system: Alert and oriented. No focal neurological deficits. Extremities: Symmetric 5 x 5 power. Skin: No rashes,  lesions Psychiatry: Judgement and insight appear normal. Mood & affect appropriate.   Data Reviewed: I have personally reviewed following labs and imaging studies  CBC: Recent Labs  Lab 12/08/20 1702 12/09/20 0300  WBC 6.4 6.1  NEUTROABS  --  4.3  HGB 10.5* 9.9*  HCT 34.1* 32.5*  MCV 81.6 82.1  PLT 177 127   Basic Metabolic Panel: Recent Labs  Lab 12/08/20 1702 12/09/20 0300  NA 137 137  K 3.9 3.9  CL 94* 95*  CO2 29 28  GLUCOSE 256* 194*  BUN 28* 34*  CREATININE 8.50* 9.52*  CALCIUM 9.2 9.3  MG  --  2.1  PHOS  --  7.7*   GFR: Estimated Creatinine Clearance: 6.1 mL/min (A) (by C-G formula based on SCr of 9.52 mg/dL (H)). Liver Function Tests: Recent Labs  Lab 12/09/20 0300  AST 10*  ALT 8  ALKPHOS 66  BILITOT 0.5  PROT 6.6  ALBUMIN 2.6*   No results for input(s): LIPASE, AMYLASE in the last 168 hours. No results for input(s): AMMONIA in the last 168 hours. Coagulation Profile: No results for input(s): INR, PROTIME in the last 168 hours. Cardiac Enzymes: No results for input(s): CKTOTAL, CKMB, CKMBINDEX, TROPONINI in the last 168 hours. BNP (last 3 results) No results for input(s): PROBNP in the last 8760 hours. HbA1C: Recent Labs    12/09/20 0300  HGBA1C 7.4*   CBG: Recent Labs  Lab 12/08/20 2114 12/09/20 0807 12/09/20 1250 12/09/20 1711 12/10/20 0645  GLUCAP 186* 112* 115* 179* 226*   Lipid Profile: No results for input(s): CHOL, HDL, LDLCALC, TRIG, CHOLHDL, LDLDIRECT in the last 72 hours. Thyroid Function Tests: No results for input(s): TSH, T4TOTAL, FREET4, T3FREE, THYROIDAB in the last 72 hours. Anemia Panel: No results for input(s): VITAMINB12, FOLATE, FERRITIN, TIBC, IRON, RETICCTPCT in the last 72 hours. Sepsis Labs: No results for input(s): PROCALCITON, LATICACIDVEN in the last 168 hours.  Recent Results (from the past 240 hour(s))  Resp Panel by RT-PCR (Flu A&B, Covid) Nasopharyngeal Swab     Status: None   Collection Time:  12/08/20  5:55 PM   Specimen: Nasopharyngeal Swab; Nasopharyngeal(NP) swabs in vial transport medium  Result Value Ref Range Status   SARS Coronavirus 2 by RT PCR NEGATIVE NEGATIVE Final    Comment: (NOTE) SARS-CoV-2 target nucleic acids are NOT DETECTED.  The SARS-CoV-2 RNA is generally detectable in upper respiratory specimens during the acute phase of infection. The lowest concentration of SARS-CoV-2 viral copies this assay can detect is 138 copies/mL. A negative result does not preclude SARS-Cov-2 infection and should not be used as the sole basis for treatment or other patient management decisions. A negative result may occur with  improper specimen collection/handling, submission of specimen other than nasopharyngeal swab, presence of viral mutation(s) within the areas targeted by this assay, and inadequate number of viral copies(<138 copies/mL). A negative result must be combined with clinical observations, patient history, and epidemiological information. The expected result is Negative.  Fact Sheet for Patients:  EntrepreneurPulse.com.au  Fact Sheet for Healthcare Providers:  IncredibleEmployment.be  This test is no t yet approved or cleared by the [place] and  has been authorized for detection and/or diagnosis of SARS-CoV-2 by FDA under an Emergency Use Authorization (EUA). This EUA will remain  in effect (meaning this test can be used) for the duration of the COVID-19 declaration under Section 564(b)(1) of the Act, 21 U.S.C.section 360bbb-3(b)(1), unless the authorization is terminated  or revoked sooner.       Influenza A by PCR NEGATIVE NEGATIVE Final   Influenza B by PCR NEGATIVE NEGATIVE Final    Comment: (NOTE) The Xpert Xpress SARS-CoV-2/FLU/RSV plus assay is intended as an aid in the diagnosis of influenza from Nasopharyngeal swab specimens and should not be used as a sole basis for treatment. Nasal washings  and aspirates are unacceptable for Xpert Xpress SARS-CoV-2/FLU/RSV testing.  Fact Sheet for Patients: EntrepreneurPulse.com.au  Fact Sheet for Healthcare Providers: IncredibleEmployment.be  This test is not yet approved or cleared by the [place] FDA and has been authorized for detection and/or diagnosis of SARS-CoV-2 by FDA under an Emergency Use Authorization (EUA). This EUA will remain in effect (meaning this test can be used) for the duration of the COVID-19 declaration under Section 564(b)(1) of the Act, 21 U.S.C. section 360bbb-3(b)(1), unless the authorization is terminated or revoked.  Performed at [facility], [place] [address], [place], [place] [address]   MRSA PCR Screening     Status: None   Collection Time: 12/09/20  5:45 PM   Specimen: Nasopharyngeal  Result Value Ref Range Status   MRSA by PCR NEGATIVE NEGATIVE Final    Comment:        The GeneXpert MRSA Assay (FDA approved for NASAL specimens only), is one component of a comprehensive MRSA colonization surveillance program. It is not intended to diagnose MRSA infection nor to guide or monitor treatment for MRSA infections. Performed at [facility], [place] [address], [place], [place] [address]      Radiology Studies: CT Angio Chest PE W and/or Wo Contrast  Result Date: 12/08/2020 CLINICAL DATA:  Shortness of breath EXAM: CT ANGIOGRAPHY CHEST WITH CONTRAST TECHNIQUE: Multidetector CT imaging of the chest was performed using the standard protocol during bolus administration of intravenous contrast. Multiplanar CT image reconstructions and MIPs were obtained to evaluate the vascular anatomy. CONTRAST:  135m OMNIPAQUE IOHEXOL 350 MG/ML SOLN COMPARISON:  None. FINDINGS: Cardiovascular: There is a optimal opacification of the pulmonary arteries. There is no central,segmental, or subsegmental filling defects within the pulmonary arteries. The heart is normal in size.  There is a small pericardial effusion present. No evidence right heart strain. There is normal three-vessel brachiocephalic anatomy without proximal stenosis. Aortic atherosclerosis noted. Coronary artery calcifications are seen. Mediastinum/Nodes: No hilar, mediastinal, or axillary adenopathy. Thyroid gland, trachea, and esophagus demonstrate no significant findings. Lungs/Pleura: There is a right and trace effusion present. Patchy airspace opacity seen at the posterior right base. Interlobular septal thickening seen within. Upper Abdomen: No acute abnormalities present in the visualized portions of the upper abdomen. Musculoskeletal: No chest wall abnormality. No acute or significant osseous findings. Review of the MIP images confirms the above findings. IMPRESSION: No central, segmental subsegmental. Small right and trace left effusion. Findings which may be suggestive interstitial edema. Aortic Atherosclerosis (ICD10-I70.0). Electronically Signed   By: BPrudencio PairM.D.   On: 12/08/2020 21:59   DG Chest Portable 1 View  Result Date: 12/08/2020 CLINICAL DATA:  Shortness of breath possible COVID EXAM: PORTABLE CHEST 1 VIEW COMPARISON:  12/31/2018 FINDINGS: Cardiomegaly with vascular congestion and mild interstitial pulmonary edema. Small pleural effusions. Aortic  atherosclerosis. No pneumothorax. IMPRESSION: Cardiomegaly with vascular congestion, mild interstitial edema and small pleural effusions. Electronically Signed   By: Donavan Foil M.D.   On: 12/08/2020 18:12   ECHOCARDIOGRAM COMPLETE  Result Date: 12/09/2020    ECHOCARDIOGRAM REPORT   Patient Name:   Colleen [name] Date of Exam: 12/09/2020 Medical Rec #:  [id number]          Height:       64.0 in Accession #:    [id number]         Weight:       150.0 lb Date of Birth:  1961-11-30           BSA:          1.731 m Patient Age:    38 years           BP:           137/57 mmHg Patient Gender: F                  HR:           79 bpm. Exam Location:   Inpatient Procedure: 2D Echo, Color Doppler and Cardiac Doppler Indications:    CHF-Acute Systolic W40.97  History:        Patient has prior history of Echocardiogram examinations, most                 recent 01/25/2018. Risk Factors:Hypertension, Dyslipidemia and                 Diabetes.  Sonographer:    Bernadene Person RDCS Referring Phys: [id number] [place]  1. Normal LV systolic function; severe MAC with mild MR; possible MV vegetation (suggest clinical correlation, blood cultures and TEE if clinically indicated); compared to 01/25/18 MAC is significantly worse.  2. Left ventricular ejection fraction, by estimation, is 55 to 60%. The left ventricle has normal function. The left ventricle has no regional wall motion abnormalities. There is mild left ventricular hypertrophy. Left ventricular diastolic parameters are indeterminate. Elevated left atrial pressure.  3. Right ventricular systolic function is normal. The right ventricular size is normal. There is mildly elevated pulmonary artery systolic pressure.  4. Left atrial size was moderately dilated.  5. A small pericardial effusion is present.  6. The mitral valve is abnormal. Mild mitral valve regurgitation. No evidence of mitral stenosis. Severe mitral annular calcification.  7. Tricuspid valve regurgitation is mild to moderate.  8. The aortic valve is tricuspid. Aortic valve regurgitation is not visualized. Mild aortic valve sclerosis is present, with no evidence of aortic valve stenosis.  9. The inferior vena cava is normal in size with greater than 50% respiratory variability, suggesting right atrial pressure of 3 mmHg. FINDINGS  Left Ventricle: Left ventricular ejection fraction, by estimation, is 55 to 60%. The left ventricle has normal function. The left ventricle has no regional wall motion abnormalities. The left ventricular internal cavity size was normal in size. There is  mild left ventricular hypertrophy. Left ventricular  diastolic parameters are indeterminate. Elevated left atrial pressure. Right Ventricle: The right ventricular size is normal. Right ventricular systolic function is normal. There is mildly elevated pulmonary artery systolic pressure. The tricuspid regurgitant velocity is 3.07 m/s, and with an assumed right atrial pressure of 3 mmHg, the estimated right ventricular systolic pressure is 42.6 mmHg. Left Atrium: Left atrial size was moderately dilated. Right Atrium: Right atrial size was normal in size. Pericardium: A small pericardial effusion is  present. Mitral Valve: The mitral valve is abnormal. Severe mitral annular calcification. Mild mitral valve regurgitation. No evidence of mitral valve stenosis. MV peak gradient, 15.7 mmHg. The mean mitral valve gradient is 7.0 mmHg. Tricuspid Valve: The tricuspid valve is normal in structure. Tricuspid valve regurgitation is mild to moderate. No evidence of tricuspid stenosis. Aortic Valve: The aortic valve is tricuspid. Aortic valve regurgitation is not visualized. Mild aortic valve sclerosis is present, with no evidence of aortic valve stenosis. Pulmonic Valve: The pulmonic valve was normal in structure. Pulmonic valve regurgitation is not visualized. No evidence of pulmonic stenosis. Aorta: The aortic root is normal in size and structure. Venous: The inferior vena cava is normal in size with greater than 50% respiratory variability, suggesting right atrial pressure of 3 mmHg.  Additional Comments: Normal LV systolic function; severe MAC with mild MR; possible MV vegetation (suggest clinical correlation, blood cultures and TEE if clinically indicated); compared to 01/25/18 MAC is significantly worse.  LEFT VENTRICLE PLAX 2D LVIDd:         4.70 cm  Diastology LVIDs:         3.30 cm  LV e' medial:    5.84 cm/s LV PW:         1.30 cm  LV E/e' medial:  32.9 LV IVS:        0.80 cm  LV e' lateral:   5.36 cm/s LVOT diam:     1.70 cm  LV E/e' lateral: 35.8 LV SV:         59 LV SV  Index:   34 LVOT Area:     2.27 cm  RIGHT VENTRICLE RV S prime:     11.00 cm/s TAPSE (M-mode): 2.3 cm LEFT ATRIUM             Index       RIGHT ATRIUM           Index LA diam:        4.20 cm 2.43 cm/m  RA Area:     14.00 cm LA Vol (A2C):   93.4 ml 53.95 ml/m RA Volume:   34.30 ml  19.81 ml/m LA Vol (A4C):   65.5 ml 37.83 ml/m LA Biplane Vol: 80.6 ml 46.56 ml/m  AORTIC VALVE LVOT Vmax:   114.00 cm/s LVOT Vmean:  83.800 cm/s LVOT VTI:    0.258 m  AORTA Ao Root diam: 2.50 cm Ao Asc diam:  2.60 cm MITRAL VALVE                TRICUSPID VALVE MV Area (PHT): 2.99 cm     TR Peak grad:   37.7 mmHg MV Peak grad:  15.7 mmHg    TR Vmax:        307.00 cm/s MV Mean grad:  7.0 mmHg MV Vmax:       1.98 m/s     SHUNTS MV Vmean:      129.0 cm/s   Systemic VTI:  0.26 m MV Decel Time: 254 msec     Systemic Diam: 1.70 cm MR PISA:        1.01 cm MR PISA Radius: 0.40 cm MV E velocity: 192.00 cm/s MV A velocity: 172.00 cm/s MV E/A ratio:  1.12 Kirk Ruths MD Electronically signed by Kirk Ruths MD Signature Date/Time: 12/09/2020/9:31:51 AM    Final     Scheduled Meds: . amLODipine  10 mg Oral QPM  . atorvastatin  10 mg Oral QHS  . carvedilol  12.5 mg  Oral QHS  . Chlorhexidine Gluconate Cloth  6 each Topical Q0600  . heparin  5,000 Units Subcutaneous Q8H  . insulin aspart  0-6 Units Subcutaneous TID AC & HS  . insulin glargine  5 Units Subcutaneous QHS   Continuous Infusions:   LOS: 1 day   Marylu Lund, MD [facility] Pager On Amion  If 7PM-7AM, please contact night-coverage 12/10/2020, 10:04 AM

## 2020-12-10 NOTE — Progress Notes (Signed)
[facility] Progress Note   Subjective:  Seen in room - s/p 3L removed with HD yesterday. Still requiring O2 and with persistent bibasilar rales. No CP or abd pain. Plan is for HD again today, per her usual schedule and for additional fluid removal.  Objective Vitals:   12/10/20 0017 12/10/20 0032 12/10/20 0446 12/10/20 0917  BP: (!) 150/74 (!) 136/46 136/61 (!) 139/50  Pulse: 88 93 85 88  Resp: 19   17  Temp: 98.7 F (37.1 C) 99.2 F (37.3 C) 99.7 F (37.6 C) 98 F (36.7 C)  TempSrc: Oral  Oral   SpO2: 98% 91% 92% 90%  Weight:      Height:       Physical Exam General: Well appearing woman, NAD. On nasal O2. Heart: RRR; no murmur Lungs: Bibasilar rales, clear in upper lobes Abdomen: soft, non-tender Extremities: No LE edema Dialysis Access: AVF + thrill  Additional Objective Labs: Basic Metabolic Panel: Recent Labs  Lab 12/08/20 1702 12/09/20 0300  NA 137 137  K 3.9 3.9  CL 94* 95*  CO2 29 28  GLUCOSE 256* 194*  BUN 28* 34*  CREATININE 8.50* 9.52*  CALCIUM 9.2 9.3  PHOS  --  7.7*   Liver Function Tests: Recent Labs  Lab 12/09/20 0300  AST 10*  ALT 8  ALKPHOS 66  BILITOT 0.5  PROT 6.6  ALBUMIN 2.6*   CBC: Recent Labs  Lab 12/08/20 1702 12/09/20 0300  WBC 6.4 6.1  NEUTROABS  --  4.3  HGB 10.5* 9.9*  HCT 34.1* 32.5*  MCV 81.6 82.1  PLT 177 156   Studies/Results: CT Angio Chest PE W and/or Wo Contrast  Result Date: 12/08/2020 CLINICAL DATA:  Shortness of breath EXAM: CT ANGIOGRAPHY CHEST WITH CONTRAST TECHNIQUE: Multidetector CT imaging of the chest was performed using the standard protocol during bolus administration of intravenous contrast. Multiplanar CT image reconstructions and MIPs were obtained to evaluate the vascular anatomy. CONTRAST:  131m OMNIPAQUE IOHEXOL 350 MG/ML SOLN COMPARISON:  None. FINDINGS: Cardiovascular: There is a optimal opacification of the pulmonary arteries. There is no central,segmental, or subsegmental  filling defects within the pulmonary arteries. The heart is normal in size. There is a small pericardial effusion present. No evidence right heart strain. There is normal three-vessel brachiocephalic anatomy without proximal stenosis. Aortic atherosclerosis noted. Coronary artery calcifications are seen. Mediastinum/Nodes: No hilar, mediastinal, or axillary adenopathy. Thyroid gland, trachea, and esophagus demonstrate no significant findings. Lungs/Pleura: There is a right and trace effusion present. Patchy airspace opacity seen at the posterior right base. Interlobular septal thickening seen within. Upper Abdomen: No acute abnormalities present in the visualized portions of the upper abdomen. Musculoskeletal: No chest wall abnormality. No acute or significant osseous findings. Review of the MIP images confirms the above findings. IMPRESSION: No central, segmental subsegmental. Small right and trace left effusion. Findings which may be suggestive interstitial edema. Aortic Atherosclerosis (ICD10-I70.0). Electronically Signed   By: BPrudencio PairM.D.   On: 12/08/2020 21:59   DG Chest Portable 1 View  Result Date: 12/08/2020 CLINICAL DATA:  Shortness of breath possible COVID EXAM: PORTABLE CHEST 1 VIEW COMPARISON:  12/31/2018 FINDINGS: Cardiomegaly with vascular congestion and mild interstitial pulmonary edema. Small pleural effusions. Aortic atherosclerosis. No pneumothorax. IMPRESSION: Cardiomegaly with vascular congestion, mild interstitial edema and small pleural effusions. Electronically Signed   By: KDonavan FoilM.D.   On: 12/08/2020 18:12   ECHOCARDIOGRAM COMPLETE  Result Date: 12/09/2020    ECHOCARDIOGRAM REPORT   Patient  Name:   LOANY NEUROTH Date of Exam: 12/09/2020 Medical Rec #:  [id number]          Height:       64.0 in Accession #:    [id number]         Weight:       150.0 lb Date of Birth:  03/11/1961           BSA:          1.731 m Patient Age:    60 years           BP:           137/57 mmHg  Patient Gender: F                  HR:           79 bpm. Exam Location:  Inpatient Procedure: 2D Echo, Color Doppler and Cardiac Doppler Indications:    CHF-Acute Systolic W40.97  History:        Patient has prior history of Echocardiogram examinations, most                 recent 01/25/2018. Risk Factors:Hypertension, Dyslipidemia and                 Diabetes.  Sonographer:    Bernadene Person RDCS Referring Phys: [id number] [place]  1. Normal LV systolic function; severe MAC with mild MR; possible MV vegetation (suggest clinical correlation, blood cultures and TEE if clinically indicated); compared to 01/25/18 MAC is significantly worse.  2. Left ventricular ejection fraction, by estimation, is 55 to 60%. The left ventricle has normal function. The left ventricle has no regional wall motion abnormalities. There is mild left ventricular hypertrophy. Left ventricular diastolic parameters are indeterminate. Elevated left atrial pressure.  3. Right ventricular systolic function is normal. The right ventricular size is normal. There is mildly elevated pulmonary artery systolic pressure.  4. Left atrial size was moderately dilated.  5. A small pericardial effusion is present.  6. The mitral valve is abnormal. Mild mitral valve regurgitation. No evidence of mitral stenosis. Severe mitral annular calcification.  7. Tricuspid valve regurgitation is mild to moderate.  8. The aortic valve is tricuspid. Aortic valve regurgitation is not visualized. Mild aortic valve sclerosis is present, with no evidence of aortic valve stenosis.  9. The inferior vena cava is normal in size with greater than 50% respiratory variability, suggesting right atrial pressure of 3 mmHg. FINDINGS  Left Ventricle: Left ventricular ejection fraction, by estimation, is 55 to 60%. The left ventricle has normal function. The left ventricle has no regional wall motion abnormalities. The left ventricular internal cavity size was normal in  size. There is  mild left ventricular hypertrophy. Left ventricular diastolic parameters are indeterminate. Elevated left atrial pressure. Right Ventricle: The right ventricular size is normal. Right ventricular systolic function is normal. There is mildly elevated pulmonary artery systolic pressure. The tricuspid regurgitant velocity is 3.07 m/s, and with an assumed right atrial pressure of 3 mmHg, the estimated right ventricular systolic pressure is 42.6 mmHg. Left Atrium: Left atrial size was moderately dilated. Right Atrium: Right atrial size was normal in size. Pericardium: A small pericardial effusion is present. Mitral Valve: The mitral valve is abnormal. Severe mitral annular calcification. Mild mitral valve regurgitation. No evidence of mitral valve stenosis. MV peak gradient, 15.7 mmHg. The mean mitral valve gradient is 7.0 mmHg. Tricuspid Valve: The tricuspid valve is normal in structure. Tricuspid  valve regurgitation is mild to moderate. No evidence of tricuspid stenosis. Aortic Valve: The aortic valve is tricuspid. Aortic valve regurgitation is not visualized. Mild aortic valve sclerosis is present, with no evidence of aortic valve stenosis. Pulmonic Valve: The pulmonic valve was normal in structure. Pulmonic valve regurgitation is not visualized. No evidence of pulmonic stenosis. Aorta: The aortic root is normal in size and structure. Venous: The inferior vena cava is normal in size with greater than 50% respiratory variability, suggesting right atrial pressure of 3 mmHg.  Additional Comments: Normal LV systolic function; severe MAC with mild MR; possible MV vegetation (suggest clinical correlation, blood cultures and TEE if clinically indicated); compared to 01/25/18 MAC is significantly worse.  LEFT VENTRICLE PLAX 2D LVIDd:         4.70 cm  Diastology LVIDs:         3.30 cm  LV e' medial:    5.84 cm/s LV PW:         1.30 cm  LV E/e' medial:  32.9 LV IVS:        0.80 cm  LV e' lateral:   5.36 cm/s LVOT  diam:     1.70 cm  LV E/e' lateral: 35.8 LV SV:         59 LV SV Index:   34 LVOT Area:     2.27 cm  RIGHT VENTRICLE RV S prime:     11.00 cm/s TAPSE (M-mode): 2.3 cm LEFT ATRIUM             Index       RIGHT ATRIUM           Index LA diam:        4.20 cm 2.43 cm/m  RA Area:     14.00 cm LA Vol (A2C):   93.4 ml 53.95 ml/m RA Volume:   34.30 ml  19.81 ml/m LA Vol (A4C):   65.5 ml 37.83 ml/m LA Biplane Vol: 80.6 ml 46.56 ml/m  AORTIC VALVE LVOT Vmax:   114.00 cm/s LVOT Vmean:  83.800 cm/s LVOT VTI:    0.258 m  AORTA Ao Root diam: 2.50 cm Ao Asc diam:  2.60 cm MITRAL VALVE                TRICUSPID VALVE MV Area (PHT): 2.99 cm     TR Peak grad:   37.7 mmHg MV Peak grad:  15.7 mmHg    TR Vmax:        307.00 cm/s MV Mean grad:  7.0 mmHg MV Vmax:       1.98 m/s     SHUNTS MV Vmean:      129.0 cm/s   Systemic VTI:  0.26 m MV Decel Time: 254 msec     Systemic Diam: 1.70 cm MR PISA:        1.01 cm MR PISA Radius: 0.40 cm MV E velocity: 192.00 cm/s MV A velocity: 172.00 cm/s MV E/A ratio:  1.12 Kirk Ruths MD Electronically signed by Kirk Ruths MD Signature Date/Time: 12/09/2020/9:31:51 AM    Final    Medications:  . amLODipine  10 mg Oral QPM  . atorvastatin  10 mg Oral QHS  . carvedilol  12.5 mg Oral QHS  . Chlorhexidine Gluconate Cloth  6 each Topical Q0600  . heparin  5,000 Units Subcutaneous Q8H  . insulin aspart  0-6 Units Subcutaneous TID AC & HS  . insulin glargine  5 Units Subcutaneous QHS    Dialysis Orders:  Center: [facility] on MWF. MonWedFri, 3 hrs 15 min, 180NRe Optiflux, BFR 350, DFR Manual 800 mL/min, EDW 68.5 (kg), Dialysate 2.0 K, 2.25 Ca, AVF, Heparin 2000 unit bolus - Sensipar 120 mg q HD - Hectorol 4 mcg IV q HD - Mircera 178mg IV q 2 weeks - last dose 12/29  Attends every treatment but usually signs off early. Leaving slightly below EDW  Assessment/Plan: 1.  Acute hypoxic respiratory failure: CXR consistent with pulmonary edema. Leaving below outpatient EDW - likely  lost some body weight and needs dry weight lowered. Still requiring O2 this morning. 2. ESRD: S/p HD last night, and will dialyze again today, per usual schedule.  3.  Hypertension/volume: BP fairly well controlled. CXR consistent with edema. UF as tolerated. 4.  Anemia: Hgb 9.9, ESA recently dosed.  5.  Metabolic bone disease: CorrCa slightly high. Continue sensipar but will decrease hectorol dose. Outpatient phos runs high - reportedly was out of binders for awhile. Resumed renvela 3 tabs with meal.   KVeneta Penton PA-C 12/10/2020, 9:28 AM  [facility]

## 2020-12-10 NOTE — Plan of Care (Signed)

## 2020-12-11 DIAGNOSIS — Z992 Dependence on renal dialysis: Secondary | ICD-10-CM

## 2020-12-11 DIAGNOSIS — N186 End stage renal disease: Secondary | ICD-10-CM | POA: Diagnosis not present

## 2020-12-11 DIAGNOSIS — I330 Acute and subacute infective endocarditis: Secondary | ICD-10-CM | POA: Diagnosis not present

## 2020-12-11 DIAGNOSIS — I10 Essential (primary) hypertension: Secondary | ICD-10-CM | POA: Diagnosis not present

## 2020-12-11 DIAGNOSIS — I120 Hypertensive chronic kidney disease with stage 5 chronic kidney disease or end stage renal disease: Secondary | ICD-10-CM | POA: Diagnosis not present

## 2020-12-11 DIAGNOSIS — D638 Anemia in other chronic diseases classified elsewhere: Secondary | ICD-10-CM | POA: Diagnosis not present

## 2020-12-11 DIAGNOSIS — E1122 Type 2 diabetes mellitus with diabetic chronic kidney disease: Secondary | ICD-10-CM | POA: Diagnosis not present

## 2020-12-11 DIAGNOSIS — E1165 Type 2 diabetes mellitus with hyperglycemia: Secondary | ICD-10-CM

## 2020-12-11 DIAGNOSIS — J9601 Acute respiratory failure with hypoxia: Secondary | ICD-10-CM | POA: Diagnosis not present

## 2020-12-11 DIAGNOSIS — Z794 Long term (current) use of insulin: Secondary | ICD-10-CM

## 2020-12-11 LAB — GLUCOSE, CAPILLARY
Glucose-Capillary: 205 mg/dL — ABNORMAL HIGH (ref 70–99)
Glucose-Capillary: 177 mg/dL — ABNORMAL HIGH (ref 70–99)
Glucose-Capillary: 149 mg/dL — ABNORMAL HIGH (ref 70–99)
Glucose-Capillary: 216 mg/dL — ABNORMAL HIGH (ref 70–99)

## 2020-12-11 MED ORDER — LOPERAMIDE HCL 2 MG PO CAPS
2.0000 mg | ORAL_CAPSULE | ORAL | Status: DC | PRN
  Administered 2020-12-11: 2 mg via ORAL
  Filled 2020-12-11: qty 1

## 2020-12-11 NOTE — Progress Notes (Signed)
    [facility] has been requested to perform a transesophageal echocardiogram on 12/12/20 for possible endocarditis.  After careful review of history and examination, the risks and benefits of transesophageal echocardiogram have been explained including risks of esophageal damage, perforation (1:10,000 risk), bleeding, pharyngeal hematoma as well as other potential complications associated with conscious sedation including aspiration, arrhythmia, respiratory failure and death. Alternatives to treatment were discussed, questions were answered. Patient is willing to proceed.   TEE scheduled for 12/12/20 at 10am with Dr. Johnsie Cancel.   Roby Lofts, PA-C 12/11/2020 4:39 PM

## 2020-12-11 NOTE — Plan of Care (Signed)
  Problem: Clinical Measurements: Goal: Respiratory complications will improve Outcome: Progressing   Problem: Activity: Goal: Risk for activity intolerance will decrease Outcome: Progressing   

## 2020-12-11 NOTE — Progress Notes (Signed)
PROGRESS NOTE    Colleen [name]  RXV:[id number] DOB: Jan 27, 1961 DOA: 12/08/2020 PCP: [name], DO    Brief Narrative:   60 year old female with past medical history of end-stage renal disease (MWF HD), diabetes mellitus type 2, hypertension, anemia of chronic disease who presents to [facility] with nausea, diarrhea, cough  Assessment & Plan:   Principal Problem:   Acute respiratory failure with hypoxia ([place]) Active Problems:   Essential hypertension   Type 2 diabetes mellitus with hyperglycemia, with long-term current use of insulin (HCC)   Acute pulmonary edema (HCC)   Anemia, chronic disease   Nausea vomiting and diarrhea   ESRD on hemodialysis (HCC)   Fluid overload  Principal Problem:   Acute respiratory failure with hypoxia ([place])  Patient presenting with evidence of acute hypoxia requiring supplemental oxygen via nasal cannula   Presentation likely secondary to development of acute pulmonary edema secondary to volume overload.  Nephrology consulted and pt underwent HD on 1/2 and again on 1/3  CT angiogram of the chest performed in the emergency department reveals no evidence of pulmonary embolism or pneumonia.  Post-HD, pt remains on supplemental O2. Wean as tolerated. Cont plan per Nephrology  Active Problems:   Acute pulmonary edema (HCC)    Nausea vomiting and diarrhea  Patient complaining of a several day history of intermittent nausea vomiting and diarrhea.  Patient reports that in the past 24 hours or so symptoms seem to be spontaneously subsided  Suspect possible viral gastroenteritis, improved  Conservative management, supportive care    Essential hypertension  Continue home regimen of antihypertensive therapy  As needed intravenous antihypertensives for markedly elevated blood pressures.    Type 2 diabetes mellitus with hyperglycemia, with long-term current use of insulin (HCC)  Accu-Cheks before every meal and nightly with sliding  scale insulin  Cont subq insulin as tolerated, adjust to maintain euglycemia    Anemia, chronic disease  Hemoglobin and hematocrit noted to be near baseline  No evidence of acute blood loss  Continue to monitor hemoglobin and hematocrit with serial CBCs    ESRD on hemodialysis [facility])   Patient undergoes hemodialysis Monday Wednesday Friday  Nephrology following. Underwent HD 1/2 and again 1/3  Per Nephrology, plan to resume scheduled HD  Possible mitral valve vegetation -Incidentally noted on 2d echo -Blood cx from 1/2 is neg -Have ordered repeat blood cx today -Have consulted ID. Recommendation for f/u TEE, hold off on abx -Discussed with Cardiology. TEE is scheduled for tomorrow AM  DVT prophylaxis: Heparin subq Code Status: Full Family Communication: Pt in room, family not at bedside  Status is: Inpatient  Remains inpatient appropriate because:Ongoing diagnostic testing needed not appropriate for outpatient work up and Unsafe d/c plan   Dispo:  Patient From: Home  Planned Disposition: Home  Expected discharge date: 12/10/2020  Medically stable for discharge: No   Consultants:   ID  Nephrology  Cardiology  Procedures:     Antimicrobials: Anti-infectives (From admission, onward)   None      Subjective: States feeling better  Objective: Vitals:   12/10/20 1525 12/10/20 1630 12/10/20 2132 12/11/20 0452  BP: (!) 139/54 (!) 147/63 (!) 135/50 (!) 147/60  Pulse: 83 85 87 83  Resp: 18 18 16 18   Temp:  98 F (36.7 C) 99.9 F (37.7 C) 98.7 F (37.1 C)  TempSrc:  Oral Oral Oral  SpO2: 99% 97% (!) 89% 94%  Weight:      Height:  Intake/Output Summary (Last 24 hours) at 12/11/2020 0939 Last data filed at 12/11/2020 0700 Gross per 24 hour  Intake 840 ml  Output 1918 ml  Net -1078 ml   Filed Weights   12/09/20 2030 12/10/20 1206 12/10/20 1510  Weight: 68.7 kg 67.1 kg 64.8 kg    Examination: General exam: Awake, laying in bed, in  nad Respiratory system: Normal respiratory effort, no wheezing Cardiovascular system: regular rate, s1, s2 Gastrointestinal system: Soft, nondistended, positive BS Central nervous system: CN2-12 grossly intact, strength intact Extremities: Perfused, no clubbing Skin: Normal skin turgor, no notable skin lesions seen Psychiatry: Mood normal // no visual hallucinations     Data Reviewed: I have personally reviewed following labs and imaging studies  CBC: Recent Labs  Lab 12/08/20 1702 12/09/20 0300 12/10/20 1218  WBC 6.4 6.1 7.3  NEUTROABS  --  4.3  --   HGB 10.5* 9.9* 10.3*  HCT 34.1* 32.5* 32.9*  MCV 81.6 82.1 81.2  PLT 177 156 242   Basic Metabolic Panel: Recent Labs  Lab 12/08/20 1702 12/09/20 0300 12/10/20 1218  NA 137 137 136  K 3.9 3.9 3.9  CL 94* 95* 96*  CO2 29 28 27   GLUCOSE 256* 194* 167*  BUN 28* 34* 22*  CREATININE 8.50* 9.52* 7.17*  CALCIUM 9.2 9.3 9.4  MG  --  2.1  --   PHOS  --  7.7* 5.7*   GFR: Estimated Creatinine Clearance: 7.4 mL/min (A) (by C-G formula based on SCr of 7.17 mg/dL (H)). Liver Function Tests: Recent Labs  Lab 12/09/20 0300 12/10/20 1218  AST 10*  --   ALT 8  --   ALKPHOS 66  --   BILITOT 0.5  --   PROT 6.6  --   ALBUMIN 2.6* 2.6*   No results for input(s): LIPASE, AMYLASE in the last 168 hours. No results for input(s): AMMONIA in the last 168 hours. Coagulation Profile: No results for input(s): INR, PROTIME in the last 168 hours. Cardiac Enzymes: No results for input(s): CKTOTAL, CKMB, CKMBINDEX, TROPONINI in the last 168 hours. BNP (last 3 results) No results for input(s): PROBNP in the last 8760 hours. HbA1C: Recent Labs    12/09/20 0300  HGBA1C 7.4*   CBG: Recent Labs  Lab 12/10/20 0645 12/10/20 1143 12/10/20 1631 12/10/20 2134 12/11/20 0659  GLUCAP 226* 170* 96 270* 205*   Lipid Profile: No results for input(s): CHOL, HDL, LDLCALC, TRIG, CHOLHDL, LDLDIRECT in the last 72 hours. Thyroid Function  Tests: No results for input(s): TSH, T4TOTAL, FREET4, T3FREE, THYROIDAB in the last 72 hours. Anemia Panel: No results for input(s): VITAMINB12, FOLATE, FERRITIN, TIBC, IRON, RETICCTPCT in the last 72 hours. Sepsis Labs: No results for input(s): PROCALCITON, LATICACIDVEN in the last 168 hours.  Recent Results (from the past 240 hour(s))  Resp Panel by RT-PCR (Flu A&B, Covid) Nasopharyngeal Swab     Status: None   Collection Time: 12/08/20  5:55 PM   Specimen: Nasopharyngeal Swab; Nasopharyngeal(NP) swabs in vial transport medium  Result Value Ref Range Status   SARS Coronavirus 2 by RT PCR NEGATIVE NEGATIVE Final    Comment: (NOTE) SARS-CoV-2 target nucleic acids are NOT DETECTED.  The SARS-CoV-2 RNA is generally detectable in upper respiratory specimens during the acute phase of infection. The lowest concentration of SARS-CoV-2 viral copies this assay can detect is 138 copies/mL. A negative result does not preclude SARS-Cov-2 infection and should not be used as the sole basis for treatment or other patient management decisions.  A negative result may occur with  improper specimen collection/handling, submission of specimen other than nasopharyngeal swab, presence of viral mutation(s) within the areas targeted by this assay, and inadequate number of viral copies(<138 copies/mL). A negative result must be combined with clinical observations, patient history, and epidemiological information. The expected result is Negative.  Fact Sheet for Patients:  EntrepreneurPulse.com.au  Fact Sheet for Healthcare Providers:  IncredibleEmployment.be  This test is no t yet approved or cleared by the [place] FDA and  has been authorized for detection and/or diagnosis of SARS-CoV-2 by FDA under an Emergency Use Authorization (EUA). This EUA will remain  in effect (meaning this test can be used) for the duration of the COVID-19 declaration under Section  564(b)(1) of the Act, 21 U.S.C.section 360bbb-3(b)(1), unless the authorization is terminated  or revoked sooner.       Influenza A by PCR NEGATIVE NEGATIVE Final   Influenza B by PCR NEGATIVE NEGATIVE Final    Comment: (NOTE) The Xpert Xpress SARS-CoV-2/FLU/RSV plus assay is intended as an aid in the diagnosis of influenza from Nasopharyngeal swab specimens and should not be used as a sole basis for treatment. Nasal washings and aspirates are unacceptable for Xpert Xpress SARS-CoV-2/FLU/RSV testing.  Fact Sheet for Patients: EntrepreneurPulse.com.au  Fact Sheet for Healthcare Providers: IncredibleEmployment.be  This test is not yet approved or cleared by the [place] FDA and has been authorized for detection and/or diagnosis of SARS-CoV-2 by FDA under an Emergency Use Authorization (EUA). This EUA will remain in effect (meaning this test can be used) for the duration of the COVID-19 declaration under Section 564(b)(1) of the Act, 21 U.S.C. section 360bbb-3(b)(1), unless the authorization is terminated or revoked.  Performed at [facility], [place] [address] [place], [place] [address]   Culture, blood (routine x 2)     Status: None (Preliminary result)   Collection Time: 12/09/20  3:56 PM   Specimen: BLOOD RIGHT HAND  Result Value Ref Range Status   Specimen Description BLOOD RIGHT HAND  Final   Special Requests   Final    BOTTLES DRAWN AEROBIC AND ANAEROBIC Blood Culture adequate volume   Culture   Final    NO GROWTH 2 DAYS Performed at [facility], [place] [address] [place], [place] [address]    Report Status PENDING  Incomplete  MRSA PCR Screening     Status: None   Collection Time: 12/09/20  5:45 PM   Specimen: Nasopharyngeal  Result Value Ref Range Status   MRSA by PCR NEGATIVE NEGATIVE Final    Comment:        The GeneXpert MRSA Assay (FDA approved for NASAL specimens only), is one component of  a comprehensive MRSA colonization surveillance program. It is not intended to diagnose MRSA infection nor to guide or monitor treatment for MRSA infections. Performed at [facility], [place] [address] [place], [place] [address]      Radiology Studies: No results found.  Scheduled Meds: . amLODipine  10 mg Oral QPM  . atorvastatin  10 mg Oral QHS  . carvedilol  12.5 mg Oral QHS  . Chlorhexidine Gluconate Cloth  6 each Topical Q0600  . heparin  5,000 Units Subcutaneous Q8H  . insulin aspart  0-6 Units Subcutaneous TID AC & HS  . insulin glargine  5 Units Subcutaneous QHS   Continuous Infusions:   LOS: 2 days   Marylu Lund, MD [facility] Pager On Amion  If 7PM-7AM, please contact  night-coverage 12/11/2020, 9:39 AM

## 2020-12-11 NOTE — Progress Notes (Addendum)
  [facility] Progress Note   Subjective:  Seen in room - feels a lot better. On room air now. No CP or nausea. S/p HD yesterday with another 1.9L off.  Objective Vitals:   12/10/20 1525 12/10/20 1630 12/10/20 2132 12/11/20 0452  BP: (!) 139/54 (!) 147/63 (!) 135/50 (!) 147/60  Pulse: 83 85 87 83  Resp: 18 18 16 18   Temp:  98 F (36.7 C) 99.9 F (37.7 C) 98.7 F (37.1 C)  TempSrc:  Oral Oral Oral  SpO2: 99% 97% (!) 89% 94%  Weight:      Height:       Physical Exam General: Well appearing woman, NAD. Room air. Heart: RRR; no murmur Lungs: Very faint RLL rales, otherwise clear throughout Abdomen: soft, non-tender Extremities: No LE edema Dialysis Access: AVF + thrill  Additional Objective Labs: Basic Metabolic Panel: Recent Labs  Lab 12/08/20 1702 12/09/20 0300 12/10/20 1218  NA 137 137 136  K 3.9 3.9 3.9  CL 94* 95* 96*  CO2 29 28 27   GLUCOSE 256* 194* 167*  BUN 28* 34* 22*  CREATININE 8.50* 9.52* 7.17*  CALCIUM 9.2 9.3 9.4  PHOS  --  7.7* 5.7*   Liver Function Tests: Recent Labs  Lab 12/09/20 0300 12/10/20 1218  AST 10*  --   ALT 8  --   ALKPHOS 66  --   BILITOT 0.5  --   PROT 6.6  --   ALBUMIN 2.6* 2.6*   CBC: Recent Labs  Lab 12/08/20 1702 12/09/20 0300 12/10/20 1218  WBC 6.4 6.1 7.3  NEUTROABS  --  4.3  --   HGB 10.5* 9.9* 10.3*  HCT 34.1* 32.5* 32.9*  MCV 81.6 82.1 81.2  PLT 177 156 181   Medications:  . amLODipine  10 mg Oral QPM  . atorvastatin  10 mg Oral QHS  . carvedilol  12.5 mg Oral QHS  . Chlorhexidine Gluconate Cloth  6 each Topical Q0600  . heparin  5,000 Units Subcutaneous Q8H  . insulin aspart  0-6 Units Subcutaneous TID AC & HS  . insulin glargine  5 Units Subcutaneous QHS    [facility] 3 hrs 15 min, 180NRe Optiflux, BFR 350, DFR Manual 800 mL/min, EDW 68.5 (kg), Dialysate 2.0 K, 2.25 Ca, AVF, Heparin 2000 unit bolus - Sensipar 120 mg q HD - Hectorol 4 mcg  IV q HD - Mircera 148mcg IV q 2 weeks - last dose 12/29  Attends every treatment but usually signs off early. Leaving slightly below EDW  Assessment/Plan: 1. Acute hypoxic respiratory failure:CT angio 1/1 with pulmonary edema. Leaving below outpatient EDW - likely lost some body weight and needs dry weight lowered. Feeling better s/p 2 HD and now on room air. 2. ESRD: Back to usual MWF schedule now - next 12/12/20. 3. Hypertension/volume:BP better today. Imaging consistent with edema on admit - symptoms improving. Will lower dry weight to 64.5kg - she is in agreement. 4. Anemia:Hgb 10.3, ESA recently dosed. 5. Metabolic bone disease:CorrCa slightly high. Continue sensipar but decreased hectorol dose. Outpatient phos runs high - reportedly was out of binders for awhile. Resumed Renvela 3 tabs with meal and improving. 6. Dispo: Ok to discharge from renal standpoint if can ambulate without need for O2.  Addendum: Apparently echo showed possible MV vegetation, BCx are being drawn - may need TEE? Plan per hospitalist.  Veneta Penton, PA-C 12/11/2020, 10:08 AM  [facility]

## 2020-12-11 NOTE — Progress Notes (Signed)
Patient complaints of itchiness and pain in her IV site, wants it to be removed. Educate patient about the importance of IV access until discharged.adamant to take it off and getting upset. MD Opyd made aware. Ok to remove IV access. IV access removed, pressure applied. No bleeding noted.

## 2020-12-11 NOTE — Plan of Care (Signed)
  Problem: Education: Goal: Knowledge of General Education information will improve Description Including pain rating scale, medication(s)/side effects and non-pharmacologic comfort measures Outcome: Progressing   Problem: Health Behavior/Discharge Planning: Goal: Ability to manage health-related needs will improve Outcome: Progressing   

## 2020-12-11 NOTE — Anesthesia Preprocedure Evaluation (Addendum)
Anesthesia Evaluation  Patient identified by MRN, date of birth, ID band Patient awake    Reviewed: Allergy & Precautions, NPO status , Patient's Chart, lab work & pertinent test results, reviewed documented beta blocker date and time   History of Anesthesia Complications Negative for: history of anesthetic complications  Airway Mallampati: II  TM Distance: >3 FB Neck ROM: Full    Dental  (+) Dental Advisory Given, Teeth Intact   Pulmonary PE   Pulmonary exam normal        Cardiovascular hypertension, Pt. on home beta blockers and Pt. on medications + DVT  Normal cardiovascular exam   '22 TTE - EF 55 to 60%. Mild left ventricular hypertrophy. Mildly elevated pulmonary artery systolic pressure. LA size was moderately dilated. A small pericardial effusion is present. Mild MR. TR is mild to moderate. Mild aortic valve sclerosis is present, with no evidence of AS.     Neuro/Psych  Neuromuscular disease (right facial droop since birth) negative psych ROS   GI/Hepatic negative GI ROS, Neg liver ROS,   Endo/Other  diabetes, Type 2, Insulin Dependent  Renal/GU ESRF and DialysisRenal disease     Musculoskeletal negative musculoskeletal ROS (+)   Abdominal   Peds  Hematology  (+) anemia ,   Anesthesia Other Findings Covid test negative   Reproductive/Obstetrics                            Anesthesia Physical Anesthesia Plan  ASA: III  Anesthesia Plan: MAC   Post-op Pain Management:    Induction: Intravenous  PONV Risk Score and Plan: 2 and Propofol infusion and Treatment may vary due to age or medical condition  Airway Management Planned: Nasal Cannula and Natural Airway  Additional Equipment: None  Intra-op Plan:   Post-operative Plan:   Informed Consent: I have reviewed the patients History and Physical, chart, labs and discussed the procedure including the risks, benefits and  alternatives for the proposed anesthesia with the patient or authorized representative who has indicated his/her understanding and acceptance.       Plan Discussed with: CRNA and Anesthesiologist  Anesthesia Plan Comments:        Anesthesia Quick Evaluation

## 2020-12-11 NOTE — Consult Note (Signed)
[facility] for [facility]    Date of Admission:  12/08/2020     Reason for Consult: Possible mitral valve vegetation     Referring Physician: Dr Wyline Copas  Current antibiotics: N/A  Previous antibiotics: N/A  ASSESSMENT:    #Possible mitral valve vegetation: Patient presents with 2 weeks of progressive fatigue which could be indicative of a subacute bacterial endocarditis, however, she has no fevers or chills.  She does have a murmur on exam.  Her biggest risk factor for bacteremia would be hemodialysis.  She reports no history of injection drug use, no alcohol use disorder.  No unusual hobbies or exposures that would raise suspicion for atypical organisms such as Bartonella, Brucella, or Coxiella.  Her symptoms may also have been explained by pulmonary edema seen on admission imaging.  Additionally she has no history of autoimmune disorder or malignancy to suggest a Martie Lee endocarditis  #End-stage renal disease on HD  #Type 2 diabetes     PLAN:    --Agree with 2 more sets of blood cultures --Continue to monitor off antibiotics --Recommend TEE given uncertainty of TTE findings   MEDICATIONS:    Scheduled Meds: . amLODipine  10 mg Oral QPM  . atorvastatin  10 mg Oral QHS  . carvedilol  12.5 mg Oral QHS  . Chlorhexidine Gluconate Cloth  6 each Topical Q0600  . heparin  5,000 Units Subcutaneous Q8H  . insulin aspart  0-6 Units Subcutaneous TID AC & HS  . insulin glargine  5 Units Subcutaneous QHS    Continuous Infusions:   PRN Meds: acetaminophen **OR** acetaminophen, albuterol, ondansetron **OR** ondansetron (ZOFRAN) IV, polyethylene glycol  HPI:    Colleen [name] is a 60 y.o. female with a past medical history of end-stage renal disease on hemodialysis, type 2 diabetes, hypertension, and anemia who was admitted January 2 after being instructed to do so by her local urgent care clinic.  Patient reports that for approximately 2 weeks prior to  admission she was having worsening fatigue symptoms.  She denied any fevers or shortness of breath.  She did report a chronic nonproductive cough that was relatively unchanged.  Initially, she thought she may have Covid despite being vaccinated.  She received a Covid test that she reports was negative.  She eventually presented to her local urgent care for evaluation over the weekend.  There she was found to be hypoxic with low O2 saturations.  She reports having a chest x-ray and being told she had pneumonia.  She also was given some sort of intramuscular injection into her buttocks but she is unsure if this was a steroid or an antibiotic.  Upon evaluation in the emergency department she was found to have persistent hypoxia and placed on nasal cannula.  CTA of the chest revealed no evidence of pulmonary embolism but did show interstitial edema with a small right pleural effusion concerning for pulmonary edema.  Per nephrology notes patient was leaving below outpatient EDW and likely lost some body weight and needs dry weight lowered.  She has been getting dialysis here in the hospital with additional fluid removal.  1 set of peripheral blood cultures was drawn on admission and is negative.  She has no evidence of leukocytosis.  She has been afebrile throughout the course of her admission.  She has not received any antibiotics while she has been inpatient.  Interestingly, she had a transthoracic echo that was completed yesterday which showed possible mitral valve vegetation.  She  denies any history of injection drug use.  She has no animals at home.  She recently purchased a condo where she lives.  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  She rope in [place] and has never traveled abroad.  She has never been incarcerated or homeless.  She has no chronic alcohol use disorder.    Past Medical History:  Diagnosis Date  . Anemia    . Chronic kidney disease    ES[facility] [place] -  . Diabetes ([place])    type II  . DVT (deep venous thrombosis) ([place])   . E-coli UTI   . Gout 06/05/2018  . History of blood transfusion   . HLD (hyperlipidemia) 06/05/2018  . Hypertension   . Kidney failure   . PE (pulmonary embolism)   . Pulled muscle    pt has right sided facial droop from pulled muscle in face since birth    Social History   Tobacco Use  . Smoking status: Never Smoker  . Smokeless tobacco: Never Used  Vaping Use  . Vaping Use: Never used  Substance Use Topics  . Alcohol use: No  . Drug use: No    Family History  Problem Relation Age of Onset  . CAD Neg Hx   . Colon cancer Neg Hx   . Esophageal cancer Neg Hx   . Cancer Neg Hx     Allergies  Allergen Reactions  . Dapagliflozin Other (See Comments)    Cannot take due to kidneys  . Hydrocodone Other (See Comments)    "Increase Glucose level," per pt  . Kombiglyze Xr [Saxagliptin-Metformin Er] Other (See Comments)    Cannot take due to kidneys    Review of Systems  Constitutional: Positive for malaise/fatigue. Negative for chills and fever.  Respiratory: Positive for cough and shortness of breath. Negative for sputum production.   Cardiovascular: Negative for chest pain.  Gastrointestinal: Negative.   Musculoskeletal: Negative.   Skin: Negative.   Neurological: Negative.   Psychiatric/Behavioral: Negative.   All other systems reviewed and are negative.   OBJECTIVE:   Blood pressure (!) 144/57, pulse 85, temperature 98.8 F (37.1 C), temperature source Oral, resp. rate 18, height 5\' 4"  (1.626 m), weight 64.8 kg, last menstrual period 12/08/2013, SpO2 94 %. Body mass index is 24.52 kg/m.  Physical Exam Constitutional:      General: She is not in acute distress.    Appearance: Normal appearance.  HENT:     Head: Normocephalic and atraumatic.     Comments: Chronic right-sided facial droop. Eyes:     Extraocular Movements: Extraocular  movements intact.     Conjunctiva/sclera: Conjunctivae normal.  Cardiovascular:     Rate and Rhythm: Normal rate and regular rhythm.     Heart sounds: Murmur heard.    Pulmonary:     Effort: Pulmonary effort is normal. No respiratory distress.     Breath sounds: Normal breath sounds.  Abdominal:     General: There is no distension.     Palpations: Abdomen is soft.     Tenderness: There is no abdominal tenderness.  Musculoskeletal:     Comments: Left upper extremity AV fistula with palpable thrill  Skin:    General: Skin is warm and dry.     Findings: No rash.  Neurological:     General: No focal deficit present.     Mental Status: She is alert and oriented  to person, place, and time.  Psychiatric:        Mood and Affect: Mood normal.        Behavior: Behavior normal.       Lab Results & Microbiology Lab Results  Component Value Date   WBC 7.3 12/10/2020   HGB 10.3 (L) 12/10/2020   HCT 32.9 (L) 12/10/2020   MCV 81.2 12/10/2020   PLT 181 12/10/2020    Lab Results  Component Value Date   NA 136 12/10/2020   K 3.9 12/10/2020   CO2 27 12/10/2020   GLUCOSE 167 (H) 12/10/2020   BUN 22 (H) 12/10/2020   CREATININE 7.17 (H) 12/10/2020   CALCIUM 9.4 12/10/2020   GFRNONAA 6 (L) 12/10/2020   GFRAA 4 (L) 06/15/2019    Lab Results  Component Value Date   ALT 8 12/09/2020   AST 10 (L) 12/09/2020   ALKPHOS 66 12/09/2020   BILITOT 0.5 12/09/2020    C-Reactive Protein  No results found for: CRP  Erythrocyte Sedimentation Rate  No results found for: ESRSEDRATE    I have reviewed the micro and lab results in Epic.  Imaging No results found.    [facility] [phone] pager 12/11/2020, 12:29 PM

## 2020-12-11 NOTE — H&P (View-Only) (Signed)
[facility] for Infectious Disease    Date of Admission:  12/08/2020     Reason for Consult: Possible mitral valve vegetation     Referring Physician: Dr Wyline Copas  Current antibiotics: N/A  Previous antibiotics: N/A  ASSESSMENT:    #Possible mitral valve vegetation: Patient presents with 2 weeks of progressive fatigue which could be indicative of a subacute bacterial endocarditis, however, she has no fevers or chills.  She does have a murmur on exam.  Her biggest risk factor for bacteremia would be hemodialysis.  She reports no history of injection drug use, no alcohol use disorder.  No unusual hobbies or exposures that would raise suspicion for atypical organisms such as Bartonella, Brucella, or Coxiella.  Her symptoms may also have been explained by pulmonary edema seen on admission imaging.  Additionally she has no history of autoimmune disorder or malignancy to suggest a Martie Lee endocarditis  #End-stage renal disease on HD  #Type 2 diabetes     PLAN:    --Agree with 2 more sets of blood cultures --Continue to monitor off antibiotics --Recommend TEE given uncertainty of TTE findings   MEDICATIONS:    Scheduled Meds: . amLODipine  10 mg Oral QPM  . atorvastatin  10 mg Oral QHS  . carvedilol  12.5 mg Oral QHS  . Chlorhexidine Gluconate Cloth  6 each Topical Q0600  . heparin  5,000 Units Subcutaneous Q8H  . insulin aspart  0-6 Units Subcutaneous TID AC & HS  . insulin glargine  5 Units Subcutaneous QHS    Continuous Infusions:   PRN Meds: acetaminophen **OR** acetaminophen, albuterol, ondansetron **OR** ondansetron (ZOFRAN) IV, polyethylene glycol  HPI:    Colleen [name] is a 60 y.o. female with a past medical history of end-stage renal disease on hemodialysis, type 2 diabetes, hypertension, and anemia who was admitted January 2 after being instructed to do so by her local urgent care clinic.  Patient reports that for approximately 2 weeks prior to  admission she was having worsening fatigue symptoms.  She denied any fevers or shortness of breath.  She did report a chronic nonproductive cough that was relatively unchanged.  Initially, she thought she may have Covid despite being vaccinated.  She received a Covid test that she reports was negative.  She eventually presented to her local urgent care for evaluation over the weekend.  There she was found to be hypoxic with low O2 saturations.  She reports having a chest x-ray and being told she had pneumonia.  She also was given some sort of intramuscular injection into her buttocks but she is unsure if this was a steroid or an antibiotic.  Upon evaluation in the emergency department she was found to have persistent hypoxia and placed on nasal cannula.  CTA of the chest revealed no evidence of pulmonary embolism but did show interstitial edema with a small right pleural effusion concerning for pulmonary edema.  Per nephrology notes patient was leaving below outpatient EDW and likely lost some body weight and needs dry weight lowered.  She has been getting dialysis here in the hospital with additional fluid removal.  1 set of peripheral blood cultures was drawn on admission and is negative.  She has no evidence of leukocytosis.  She has been afebrile throughout the course of her admission.  She has not received any antibiotics while she has been inpatient.  Interestingly, she had a transthoracic echo that was completed yesterday which showed possible mitral valve vegetation.  She  denies any history of injection drug use.  She has no animals at home.  She recently purchased a condo where she lives.  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  She rope in [place] and has never traveled abroad.  She has never been incarcerated or homeless.  She has no chronic alcohol use disorder.    Past Medical History:  Diagnosis Date  . Anemia    . Chronic kidney disease    ES[facility] [place] -  . Diabetes ([place])    type II  . DVT (deep venous thrombosis) ([place])   . E-coli UTI   . Gout 06/05/2018  . History of blood transfusion   . HLD (hyperlipidemia) 06/05/2018  . Hypertension   . Kidney failure   . PE (pulmonary embolism)   . Pulled muscle    pt has right sided facial droop from pulled muscle in face since birth    Social History   Tobacco Use  . Smoking status: Never Smoker  . Smokeless tobacco: Never Used  Vaping Use  . Vaping Use: Never used  Substance Use Topics  . Alcohol use: No  . Drug use: No    Family History  Problem Relation Age of Onset  . CAD Neg Hx   . Colon cancer Neg Hx   . Esophageal cancer Neg Hx   . Cancer Neg Hx     Allergies  Allergen Reactions  . Dapagliflozin Other (See Comments)    Cannot take due to kidneys  . Hydrocodone Other (See Comments)    "Increase Glucose level," per pt  . Kombiglyze Xr [Saxagliptin-Metformin Er] Other (See Comments)    Cannot take due to kidneys    Review of Systems  Constitutional: Positive for malaise/fatigue. Negative for chills and fever.  Respiratory: Positive for cough and shortness of breath. Negative for sputum production.   Cardiovascular: Negative for chest pain.  Gastrointestinal: Negative.   Musculoskeletal: Negative.   Skin: Negative.   Neurological: Negative.   Psychiatric/Behavioral: Negative.   All other systems reviewed and are negative.   OBJECTIVE:   Blood pressure (!) 144/57, pulse 85, temperature 98.8 F (37.1 C), temperature source Oral, resp. rate 18, height 5\' 4"  (1.626 m), weight 64.8 kg, last menstrual period 12/08/2013, SpO2 94 %. Body mass index is 24.52 kg/m.  Physical Exam Constitutional:      General: She is not in acute distress.    Appearance: Normal appearance.  HENT:     Head: Normocephalic and atraumatic.     Comments: Chronic right-sided facial droop. Eyes:     Extraocular Movements: Extraocular  movements intact.     Conjunctiva/sclera: Conjunctivae normal.  Cardiovascular:     Rate and Rhythm: Normal rate and regular rhythm.     Heart sounds: Murmur heard.    Pulmonary:     Effort: Pulmonary effort is normal. No respiratory distress.     Breath sounds: Normal breath sounds.  Abdominal:     General: There is no distension.     Palpations: Abdomen is soft.     Tenderness: There is no abdominal tenderness.  Musculoskeletal:     Comments: Left upper extremity AV fistula with palpable thrill  Skin:    General: Skin is warm and dry.     Findings: No rash.  Neurological:     General: No focal deficit present.     Mental Status: She is alert and oriented  to person, place, and time.  Psychiatric:        Mood and Affect: Mood normal.        Behavior: Behavior normal.       Lab Results & Microbiology Lab Results  Component Value Date   WBC 7.3 12/10/2020   HGB 10.3 (L) 12/10/2020   HCT 32.9 (L) 12/10/2020   MCV 81.2 12/10/2020   PLT 181 12/10/2020    Lab Results  Component Value Date   NA 136 12/10/2020   K 3.9 12/10/2020   CO2 27 12/10/2020   GLUCOSE 167 (H) 12/10/2020   BUN 22 (H) 12/10/2020   CREATININE 7.17 (H) 12/10/2020   CALCIUM 9.4 12/10/2020   GFRNONAA 6 (L) 12/10/2020   GFRAA 4 (L) 06/15/2019    Lab Results  Component Value Date   ALT 8 12/09/2020   AST 10 (L) 12/09/2020   ALKPHOS 66 12/09/2020   BILITOT 0.5 12/09/2020    C-Reactive Protein  No results found for: CRP  Erythrocyte Sedimentation Rate  No results found for: ESRSEDRATE    I have reviewed the micro and lab results in Epic.  Imaging No results found.    Raynelle [facility] [phone] pager 12/11/2020, 12:29 PM

## 2020-12-12 ENCOUNTER — Inpatient Hospital Stay (HOSPITAL_COMMUNITY): Payer: Federal, State, Local not specified - PPO | Admitting: Anesthesiology

## 2020-12-12 ENCOUNTER — Inpatient Hospital Stay (HOSPITAL_COMMUNITY): Payer: Federal, State, Local not specified - PPO

## 2020-12-12 ENCOUNTER — Encounter (HOSPITAL_COMMUNITY)
Admission: EM | Disposition: A | Discharge status: Home / Self Care | Payer: Self-pay | Source: Home / Self Care | Attending: Internal Medicine

## 2020-12-12 ENCOUNTER — Encounter (HOSPITAL_COMMUNITY): Payer: Self-pay | Admitting: Internal Medicine

## 2020-12-12 DIAGNOSIS — I088 Other rheumatic multiple valve diseases: Secondary | ICD-10-CM | POA: Diagnosis not present

## 2020-12-12 DIAGNOSIS — I340 Nonrheumatic mitral (valve) insufficiency: Secondary | ICD-10-CM

## 2020-12-12 DIAGNOSIS — N186 End stage renal disease: Secondary | ICD-10-CM | POA: Diagnosis not present

## 2020-12-12 DIAGNOSIS — I313 Pericardial effusion (noninflammatory): Secondary | ICD-10-CM | POA: Diagnosis not present

## 2020-12-12 DIAGNOSIS — E1122 Type 2 diabetes mellitus with diabetic chronic kidney disease: Secondary | ICD-10-CM | POA: Diagnosis not present

## 2020-12-12 DIAGNOSIS — E871 Hypo-osmolality and hyponatremia: Secondary | ICD-10-CM | POA: Diagnosis not present

## 2020-12-12 DIAGNOSIS — I120 Hypertensive chronic kidney disease with stage 5 chronic kidney disease or end stage renal disease: Secondary | ICD-10-CM | POA: Diagnosis not present

## 2020-12-12 DIAGNOSIS — J9601 Acute respiratory failure with hypoxia: Secondary | ICD-10-CM | POA: Diagnosis not present

## 2020-12-12 DIAGNOSIS — Z992 Dependence on renal dialysis: Secondary | ICD-10-CM | POA: Diagnosis not present

## 2020-12-12 DIAGNOSIS — E785 Hyperlipidemia, unspecified: Secondary | ICD-10-CM | POA: Diagnosis not present

## 2020-12-12 HISTORY — PX: TEE WITHOUT CARDIOVERSION: SHX5443

## 2020-12-12 LAB — RENAL FUNCTION PANEL
Albumin: 2.6 g/dL — ABNORMAL LOW (ref 3.5–5.0)
Anion gap: 15 (ref 5–15)
BUN: 39 mg/dL — ABNORMAL HIGH (ref 6–20)
CO2: 26 mmol/L (ref 22–32)
Calcium: 9.6 mg/dL (ref 8.9–10.3)
Chloride: 97 mmol/L — ABNORMAL LOW (ref 98–111)
Creatinine, Ser: 9.77 mg/dL — ABNORMAL HIGH (ref 0.44–1.00)
GFR, Estimated: 4 mL/min — ABNORMAL LOW (ref >60)
Glucose, Bld: 193 mg/dL — ABNORMAL HIGH (ref 70–99)
Phosphorus: 7.6 mg/dL — ABNORMAL HIGH (ref 2.5–4.6)
Potassium: 4 mmol/L (ref 3.5–5.1)
Sodium: 138 mmol/L (ref 135–145)

## 2020-12-12 LAB — ECHO TEE
AV Mean grad: 2 mmHg
AV Peak grad: 4.4 mmHg
Ao pk vel: 1.05 m/s
Area-P 1/2: 2.93 cm2

## 2020-12-12 LAB — GLUCOSE, CAPILLARY
Glucose-Capillary: 166 mg/dL — ABNORMAL HIGH (ref 70–99)
Glucose-Capillary: 129 mg/dL — ABNORMAL HIGH (ref 70–99)
Glucose-Capillary: 181 mg/dL — ABNORMAL HIGH (ref 70–99)

## 2020-12-12 LAB — CBC
HCT: 34.6 % — ABNORMAL LOW (ref 36.0–46.0)
Hemoglobin: 10.8 g/dL — ABNORMAL LOW (ref 12.0–15.0)
MCH: 25.1 pg — ABNORMAL LOW (ref 26.0–34.0)
MCHC: 31.2 g/dL (ref 30.0–36.0)
MCV: 80.3 fL (ref 80.0–100.0)
Platelets: 189 10*3/uL (ref 150–400)
RBC: 4.31 MIL/uL (ref 3.87–5.11)
RDW: 18.2 % — ABNORMAL HIGH (ref 11.5–15.5)
WBC: 8.3 10*3/uL (ref 4.0–10.5)
nRBC: 0 % (ref 0.0–0.2)

## 2020-12-12 SURGERY — ECHOCARDIOGRAM, TRANSESOPHAGEAL
Anesthesia: Monitor Anesthesia Care

## 2020-12-12 MED ORDER — BUTAMBEN-TETRACAINE-BENZOCAINE 2-2-14 % EX AERO
INHALATION_SPRAY | CUTANEOUS | Status: DC | PRN
  Administered 2020-12-12: 2 via TOPICAL

## 2020-12-12 MED ORDER — PROPOFOL 500 MG/50ML IV EMUL
INTRAVENOUS | Status: DC | PRN
  Administered 2020-12-12: 150 ug/kg/min via INTRAVENOUS

## 2020-12-12 MED ORDER — BASAGLAR KWIKPEN 100 UNIT/ML ~~LOC~~ SOPN: 5.0000 [IU] | PEN_INJECTOR | Freq: Every day | SUBCUTANEOUS | Status: DC

## 2020-12-12 MED ORDER — SODIUM CHLORIDE 0.9 % IV SOLN: INTRAVENOUS | Status: DC | PRN

## 2020-12-12 NOTE — Progress Notes (Signed)
    [facility]   Date of Admission:  12/08/2020     Interval History:  No acute events, afebrile.  Repeat blood cx drawn.  Off abx.   TEE today scheduled.  Physical Exam: Blood pressure (!) 142/59, pulse 91, temperature 98.6 F (37 C), temperature source Oral, resp. rate 16, height 5\' 4"  (1.626 m), weight 64.8 kg, last menstrual period 12/08/2013, SpO2 94 %.  Assessment:  Possible endocarditis awaiting TEE  Recommendations: -- No changes today.  Will follow up TEE results.    Raynelle [facility] [phone] pager 12/12/2020, 6:53 AM

## 2020-12-12 NOTE — Progress Notes (Signed)
10:27 pt yellow MEWS pt off the floor in ENDO  Nadiyah Zeis, RN

## 2020-12-12 NOTE — Progress Notes (Signed)
[facility] Progress Note   Subjective:   Seen in room. S/p TEE this morning - no valve vegetation, was calcification only. No CP or dyspnea. Will be dialyzed later today.  Objective Vitals:   12/12/20 1027 12/12/20 1030 12/12/20 1040 12/12/20 1056  BP: 122/72  (!) 120/52 (!) 124/49  Pulse: 88 86 83 85  Resp: (!) 24 (!) 24 (!) 22 18  Temp:    98.2 F (36.8 C)  TempSrc:      SpO2: 94% 92% 95% 94%  Weight:      Height:       Physical Exam General:Well appearing woman, NAD. Room air. Heart:RRR; 2/6 murmur Lungs: CTAB Abdomen:soft, non-tender Extremities:No LE edema Dialysis Access:AVF + thrill  Additional Objective Labs: Basic Metabolic Panel: Recent Labs  Lab 12/08/20 1702 12/09/20 0300 12/10/20 1218  NA 137 137 136  K 3.9 3.9 3.9  CL 94* 95* 96*  CO2 _0 GLUCOSE 256* 194* 167*  BUN 28* 34* 22*  CREATININE 8.50* 9.52* 7.17*  CALCIUM 9.2 9.3 9.4  PHOS  --  7.7* 5.7*   Liver Function Tests: Recent Labs  Lab 12/09/20 0300 12/10/20 1218  AST 10*  --   ALT 8  --   ALKPHOS 66  --   BILITOT 0.5  --   PROT 6.6  --   ALBUMIN 2.6* 2.6*   CBC: Recent Labs  Lab 12/08/20 1702 12/09/20 0300 12/10/20 1218  WBC 6.4 6.1 7.3  NEUTROABS  --  4.3  --   HGB 10.5* 9.9* 10.3*  HCT 34.1* 32.5* 32.9*  MCV 81.6 82.1 81.2  PLT 177 156 181   Blood Culture    Component Value Date/Time   SDES BLOOD RIGHT HAND 12/11/2020 1151   SPECREQUEST  12/11/2020 1151    BOTTLES DRAWN AEROBIC AND ANAEROBIC Blood Culture adequate volume   CULT  12/11/2020 1151    NO GROWTH < 24 HOURS Performed at [facility] [address] [place], [place] [address]    REPTSTATUS PENDING 12/11/2020 1151   Studies/Results: ECHO TEE  Result Date: 12/12/2020    TRANSESOPHOGEAL ECHO REPORT   Patient Name:   KANDISE RIEHLE Date of Exam: 12/12/2020 Medical Rec #:  [id number]          Height:       64.0 in Accession #:    [id number]         Weight:       142.9 lb  Date of Birth:  November 13, 1961           BSA:          1.696 m Patient Age:    60 years           BP:           122/72 mmHg Patient Gender: F                  HR:           87 bpm. Exam Location:  Inpatient Procedure: 3D Echo, Transesophageal Echo, Cardiac Doppler and Color Doppler Indications:    I34.8 Other nonrheumatic mitral valve disorders  History:        Patient has prior history of Echocardiogram examinations, most                 recent 12/09/2020. Cardiomyopathy, Mitral Valve Disease; Risk                 Factors:Hypertension  and Dyslipidemia. ESRD. Edema. Shock.  Sonographer:    [place] Referring Phys: [id number] [place]: After discussion of the risks and benefits of a TEE, an informed consent was obtained from the patient. The transesophogeal probe was passed without difficulty through the esophogus of the patient. Imaged were obtained with the patient in a left lateral decubitus position. Sedation performed by different physician. The patient was monitored while under deep sedation. Anesthestetic sedation was provided intravenously by Anesthesiology: 158m of Propofol. The patient developed no complications during the procedure. IMPRESSIONS  1. Left ventricular ejection fraction, by estimation, is 55 to 60%. The left ventricle has normal function. There is mild left ventricular hypertrophy. Left ventricular diastolic parameters are indeterminate.  2. Right ventricular systolic function is moderately reduced. The right ventricular size is moderately enlarged.  3. Left atrial size was moderately dilated. No left atrial/left atrial appendage thrombus was detected.  4. Right atrial size was moderately dilated.  5. The pericardial effusion is posterior to the left ventricle.  6. Exuberant MAC particularly involving the posterior annulus with some bulky mobile areas of calcification Although this may represent a chronic form of SBE I suspect this is degenerative mitral annular calcification  exuberant frequently seen in dialysis patients. . The mitral valve is degenerative. Mild mitral valve regurgitation. Severe mitral annular calcification.  7. The aortic valve is tricuspid. Aortic valve regurgitation is not visualized. Mild aortic valve stenosis.  8. PFO suggested on 3D imaging but no color flow seen. FINDINGS  Left Ventricle: Left ventricular ejection fraction, by estimation, is 55 to 60%. The left ventricle has normal function. The left ventricular internal cavity size was normal in size. There is mild left ventricular hypertrophy. Left ventricular diastolic  parameters are indeterminate. Right Ventricle: The right ventricular size is moderately enlarged. Right vetricular wall thickness was not assessed. Right ventricular systolic function is moderately reduced. Left Atrium: Left atrial size was moderately dilated. No left atrial/left atrial appendage thrombus was detected. Right Atrium: Right atrial size was moderately dilated. Pericardium: Trivial pericardial effusion is present. The pericardial effusion is posterior to the left ventricle. Mitral Valve: Exuberant MAC particularly involving the posterior annulus with some bulky mobile areas of calcification Although this may represent a chronic form of SBE I suspect this is degenerative mitral annular calcification exuberant frequently seen  in dialysis patients. The mitral valve is degenerative in appearance. There is severe thickening of the mitral valve leaflet(s). There is severe calcification of the mitral valve leaflet(s). Severe mitral annular calcification. Mild mitral valve regurgitation. MV peak gradient, 9.5 mmHg. The mean mitral valve gradient is 5.0 mmHg. Tricuspid Valve: The tricuspid valve is normal in structure. Tricuspid valve regurgitation is mild. There is no evidence of tricuspid valve vegetation. Aortic Valve: The aortic valve is tricuspid. Aortic valve regurgitation is not visualized. Mild aortic stenosis is present. Aortic  valve mean gradient measures 2.0 mmHg. Aortic valve peak gradient measures 4.4 mmHg. There is no evidence of aortic valve vegetation. Pulmonic Valve: The pulmonic valve was normal in structure. Pulmonic valve regurgitation is mild. There is no evidence of pulmonic valve vegetation. Aorta: The aortic root is normal in size and structure. IAS/Shunts: No atrial level shunt detected by color flow Doppler. PFO suggested on 3D imaging but no color flow seen.  AORTIC VALVE AV Vmax:      105.00 cm/s AV Vmean:     67.500 cm/s AV VTI:       0.206 m AV Peak Grad: 4.4 mmHg  AV Mean Grad: 2.0 mmHg MITRAL VALVE MV Area (PHT): 2.93 cm MV Peak grad:  9.5 mmHg MV Mean grad:  5.0 mmHg MV Vmax:       1.54 m/s MV Vmean:      105.0 cm/s Jenkins Rouge MD Electronically signed by Jenkins Rouge MD Signature Date/Time: 12/12/2020/11:08:28 AM    Final    Medications:  . amLODipine  10 mg Oral QPM  . atorvastatin  10 mg Oral QHS  . carvedilol  12.5 mg Oral QHS  . Chlorhexidine Gluconate Cloth  6 each Topical Q0600  . heparin  5,000 Units Subcutaneous Q8H  . insulin aspart  0-6 Units Subcutaneous TID AC & HS  . insulin glargine  5 Units Subcutaneous QHS    [facility]: [facility] 3 hrs 15 min, 180NRe Optiflux, BFR 350, DFR Manual 800 mL/min, EDW 68.5 (kg), Dialysate 2.0 K, 2.25 Ca, AVF,Heparin 2000 unit bolus -Sensipar 120 mg q HD -Hectorol 4 mcg IV q HD -Mircera 177mg IV q 2 weeks - last dose 12/29  Attends every treatment but usually signs off early. Leaving slightly below EDW  Assessment/Plan: 1. Acute hypoxic respiratory failure:CT angio 1/1 with pulmonary edema. Leaving below outpatient EDW - likely lost some body weight and needs dry weight lowered. Feeling better s/p 2 HD and now on room air. 2. ESRD:Back to usual MWF schedule now - next 12/12/20. 3. Hypertension/volume:BP better. Imaging consistent with edema on admit - symptoms improving. Will lower dry weight to 64.5kg -  she is in agreement. 4. Anemia:Hgb 10.3, ESA recently dosed. 5. Metabolic bone disease:CorrCa slightly high. Continue sensipar but decreased hectorol dose. Outpatient phos runs high - reportedly was out of binders for awhile. ResumedRenvela 3 tabs with meal and improving. 6. Abnormal echo: Concern for SBE - BCx 1/4 collected, negative so far. Underwent TEE this morning - no vegetations. 7. Dispo: Ok to discharge from renal standpoint after HD.   KVeneta Penton PA-C 12/12/2020, 1:08 PM  [employer]

## 2020-12-12 NOTE — Interval H&P Note (Signed)
History and Physical Interval Note:  12/12/2020 9:21 AM  Colleen [name]  has presented today for surgery, with the diagnosis of bacteremia.  The various methods of treatment have been discussed with the patient and family. After consideration of risks, benefits and other options for treatment, the patient has consented to  Procedure(s): TRANSESOPHAGEAL ECHOCARDIOGRAM (TEE) (N/A) as a surgical intervention.  The patient's history has been reviewed, patient examined, no change in status, stable for surgery.  I have reviewed the patient's chart and labs.  Questions were answered to the patient's satisfaction.     Jenkins Rouge

## 2020-12-12 NOTE — Transfer of Care (Signed)
Immediate Anesthesia Transfer of Care Note  Patient: Colleen [name]  Procedure(s) Performed: TRANSESOPHAGEAL ECHOCARDIOGRAM (TEE) (N/A )  Patient Location: Endoscopy Unit  Anesthesia Type:MAC  Level of Consciousness: drowsy  Airway & Oxygen Therapy: Patient Spontanous Breathing and Patient connected to nasal cannula oxygen  Post-op Assessment: Report given to RN  Post vital signs: Reviewed and stable  Last Vitals:  Vitals Value Taken Time  BP    Temp    Pulse 92 12/12/20 1026  Resp 14 12/12/20 1026  SpO2 92 % 12/12/20 1026  Vitals shown include unvalidated device data.  Last Pain:  Vitals:   12/12/20 0930  TempSrc: Oral  PainSc: 0-No pain         Complications: No complications documented.

## 2020-12-12 NOTE — CV Procedure (Signed)
TEE Anesthesia:  Propofol  EF 55-60%  Bi atrial enlargement AV sclerosisi Normal TV/PV mild regurgitations Exuberant MAC especially of posterior leaflet with some mobile calcium Do not think this represents acute SBE Mild MR Trivial pericardial effusion No LAA thrombus Likely small PFO  Extensive 3D imaging of septum, and mitral valve performed See full report in syngo  Jenkins Rouge MD [facility]

## 2020-12-12 NOTE — Progress Notes (Signed)
DISCHARGE NOTE HOME Agness Sibrian Culmer to be discharged home per MD order. Discussed prescriptions and follow up appointments with the patient. Prescriptions given to patient; medication list explained in detail. Patient verbalized understanding.  Skin clean, dry and intact without evidence of skin break down, no evidence of skin tears noted. IV catheter discontinued intact. Site without signs and symptoms of complications. Dressing and pressure applied. Pt denies pain at the site currently. No complaints noted.  Patient free of lines, drains, and wounds.   An After Visit Summary (AVS) was printed and given to the patient. Patient escorted via wheelchair, and discharged home via private auto.  Annalia Metzger S Kier Smead, RN

## 2020-12-12 NOTE — Progress Notes (Signed)
  Echocardiogram Echocardiogram Transesophageal has been performed.  Colleen [name] 12/12/2020, 10:39 AM

## 2020-12-12 NOTE — Discharge Summary (Signed)
Physician Discharge Summary  Colleen [name]:[id number] DOB: [birth year] DOA: 12/08/2020  PCP: [name], DO  Admit date: 12/08/2020 Discharge date: 12/12/2020  Time spent: 35 minutes  Recommendations for Outpatient Follow-up:  1. PCP in 1 week 2. Dry weight for hemodialysis lowered to 64.5 kg   Discharge Diagnoses:  Principal Problem:   Acute respiratory failure with hypoxia (HCC) Pulmonary edema   Essential hypertension   Type 2 diabetes mellitus with hyperglycemia, with long-term current use of insulin (HCC)   Acute pulmonary edema (HCC)   Anemia, chronic disease   Nausea vomiting and diarrhea   ESRD on hemodialysis (HCC)   Fluid overload   Mitral valve vegetation   Discharge Condition: Stable  Diet recommendation: Renal  Filed Weights   12/09/20 2030 12/10/20 1206 12/10/20 1510  Weight: 68.7 kg 67.1 kg 64.8 kg    History of present illness:  60 year old female with past medical history of end-stage renal disease (MWF HD),diabetes mellitus type 2, hypertension, anemia of chronic disease who presents to [facility] with nausea, diarrhea, cough  Hospital Course:   Acute hypoxic respiratory failure -Due to pulmonary edema, lost body weight and needed dry weight lowered -Improved after 2 dialysis sessions -Next dialysis this afternoon, and then back to her regular dialysis unit on Friday, dry weight lowered to 64.5 kg  ESRD with volume overload -As above, dry weight lowered to 64.5kg  Anemia of chronic disease -Stable, EPO continued  Abnormal echo, mitral valve -Had a 2D echo done this admission which showed concern for possible vegetation affecting the mitral valve, patient had no symptoms, was afebrile, blood cultures were drawn which remained negative, infectious disease was consulted and she underwent transesophageal echocardiogram(TEE) this morning which was negative for endocarditis, no vegetations noted, mitral valve calcification  suspected.     Procedures: Transesophageal echocardiogram EF 55-60%  Bi atrial enlargement AV sclerosisi Normal TV/PV mild regurgitations Exuberant MAC especially of posterior leaflet with some mobile calcium Do not think this represents acute SBE Mild MR Trivial pericardial effusion No LAA thrombus  Likely small PFO  Consultations:  Nephrology  Infectious disease  Discharge Exam: Vitals:   12/12/20 1040 12/12/20 1056  BP: (!) 120/52 (!) 124/49  Pulse: 83 85  Resp: (!) 22 18  Temp:  98.2 F (36.8 C)  SpO2: 95% 94%    General: Pleasant female sitting up in bed, AAOx3 HEENT: Chronic right facial droop noted Cardiovascular: S1-S2, regular rate rhythm, systolic murmur noted Respiratory: Decreased breath sounds the bases Abdomen: Soft, nontender, bowel sounds present Extremities: No edema, left arm AV fistula  Discharge Instructions   Discharge Instructions    Discharge instructions   Complete by: As directed    Renal Diabetic Diet   Increase activity slowly   Complete by: As directed      Allergies as of 12/12/2020      Reactions   Dapagliflozin Other (See Comments)   Cannot take due to kidneys   Hydrocodone Other (See Comments)   "Increase Glucose level," per pt   Kombiglyze Xr [saxagliptin-metformin Er] Other (See Comments)   Cannot take due to kidneys      Medication List    TAKE these medications   amLODipine 10 MG tablet Commonly known as: NORVASC Take 1 tablet (10 mg total) by mouth daily for 21 days. What changed: when to take this   atorvastatin 10 MG tablet Commonly known as: LIPITOR Take 10 mg by mouth at bedtime.   Basaglar KwikPen 100 UNIT/ML Inject 5  Units into the skin at bedtime. What changed: how much to take   carvedilol 12.5 MG tablet Commonly known as: Coreg Take 1 tablet (12.5 mg total) by mouth 2 (two) times daily with a meal for 21 days. What changed: when to take this   HumaLOG KwikPen 100 UNIT/ML KwikPen Generic  drug: insulin lispro Inject 1-3 Units into the skin See admin instructions. Inject 1-3 units into the skin once a day, PER SLIDING SCALE   lanthanum 1000 MG chewable tablet Commonly known as: FOSRENOL Chew 1,000 mg by mouth 3 (three) times daily with meals.   lidocaine-prilocaine cream Commonly known as: EMLA Apply 1 application topically every Monday, Wednesday, and Friday with hemodialysis.      Allergies  Allergen Reactions  . Dapagliflozin Other (See Comments)    Cannot take due to kidneys  . Hydrocodone Other (See Comments)    "Increase Glucose level," per pt  . Kombiglyze Xr [Saxagliptin-Metformin Er] Other (See Comments)    Cannot take due to kidneys    Follow-up [place], DO. Schedule an appointment as soon as possible for a visit in 1 week(s).   Specialty: Family Medicine Contact information: [address] [place] [address] [phone]                The results of significant diagnostics from this hospitalization (including imaging, microbiology, ancillary and laboratory) are listed below for reference.    Significant Diagnostic Studies: CT Angio Chest PE W and/or Wo Contrast  Result Date: 12/08/2020 CLINICAL DATA:  Shortness of breath EXAM: CT ANGIOGRAPHY CHEST WITH CONTRAST TECHNIQUE: Multidetector CT imaging of the chest was performed using the standard protocol during bolus administration of intravenous contrast. Multiplanar CT image reconstructions and MIPs were obtained to evaluate the vascular anatomy. CONTRAST:  119m OMNIPAQUE IOHEXOL 350 MG/ML SOLN COMPARISON:  None. FINDINGS: Cardiovascular: There is a optimal opacification of the pulmonary arteries. There is no central,segmental, or subsegmental filling defects within the pulmonary arteries. The heart is normal in size. There is a small pericardial effusion present. No evidence right heart strain. There is normal three-vessel brachiocephalic anatomy without  proximal stenosis. Aortic atherosclerosis noted. Coronary artery calcifications are seen. Mediastinum/Nodes: No hilar, mediastinal, or axillary adenopathy. Thyroid gland, trachea, and esophagus demonstrate no significant findings. Lungs/Pleura: There is a right and trace effusion present. Patchy airspace opacity seen at the posterior right base. Interlobular septal thickening seen within. Upper Abdomen: No acute abnormalities present in the visualized portions of the upper abdomen. Musculoskeletal: No chest wall abnormality. No acute or significant osseous findings. Review of the MIP images confirms the above findings. IMPRESSION: No central, segmental subsegmental. Small right and trace left effusion. Findings which may be suggestive interstitial edema. Aortic Atherosclerosis (ICD10-I70.0). Electronically Signed   By: BPrudencio PairM.D.   On: 12/08/2020 21:59   DG Chest Portable 1 View  Result Date: 12/08/2020 CLINICAL DATA:  Shortness of breath possible COVID EXAM: PORTABLE CHEST 1 VIEW COMPARISON:  12/31/2018 FINDINGS: Cardiomegaly with vascular congestion and mild interstitial pulmonary edema. Small pleural effusions. Aortic atherosclerosis. No pneumothorax. IMPRESSION: Cardiomegaly with vascular congestion, mild interstitial edema and small pleural effusions. Electronically Signed   By: KDonavan FoilM.D.   On: 12/08/2020 18:12   ECHOCARDIOGRAM COMPLETE  Result Date: 12/09/2020    ECHOCARDIOGRAM REPORT   Patient Name:   VSHAWNTEL FARNWORTHDate of Exam: 12/09/2020 Medical Rec #:  [id number]         Height:  64.0 in Accession #:    [id number]         Weight:       150.0 lb Date of Birth:  January 09, 1961           BSA:          1.731 m Patient Age:    6 years           BP:           137/57 mmHg Patient Gender: F                  HR:           79 bpm. Exam Location:  Inpatient Procedure: 2D Echo, Color Doppler and Cardiac Doppler Indications:    CHF-Acute Systolic D32.20  History:        Patient has prior  history of Echocardiogram examinations, most                 recent 01/25/2018. Risk Factors:Hypertension, Dyslipidemia and                 Diabetes.  Sonographer:    Bernadene Person RDCS Referring Phys: [id number] [place]  1. Normal LV systolic function; severe MAC with mild MR; possible MV vegetation (suggest clinical correlation, blood cultures and TEE if clinically indicated); compared to 01/25/18 MAC is significantly worse.  2. Left ventricular ejection fraction, by estimation, is 55 to 60%. The left ventricle has normal function. The left ventricle has no regional wall motion abnormalities. There is mild left ventricular hypertrophy. Left ventricular diastolic parameters are indeterminate. Elevated left atrial pressure.  3. Right ventricular systolic function is normal. The right ventricular size is normal. There is mildly elevated pulmonary artery systolic pressure.  4. Left atrial size was moderately dilated.  5. A small pericardial effusion is present.  6. The mitral valve is abnormal. Mild mitral valve regurgitation. No evidence of mitral stenosis. Severe mitral annular calcification.  7. Tricuspid valve regurgitation is mild to moderate.  8. The aortic valve is tricuspid. Aortic valve regurgitation is not visualized. Mild aortic valve sclerosis is present, with no evidence of aortic valve stenosis.  9. The inferior vena cava is normal in size with greater than 50% respiratory variability, suggesting right atrial pressure of 3 mmHg. FINDINGS  Left Ventricle: Left ventricular ejection fraction, by estimation, is 55 to 60%. The left ventricle has normal function. The left ventricle has no regional wall motion abnormalities. The left ventricular internal cavity size was normal in size. There is  mild left ventricular hypertrophy. Left ventricular diastolic parameters are indeterminate. Elevated left atrial pressure. Right Ventricle: The right ventricular size is normal. Right ventricular  systolic function is normal. There is mildly elevated pulmonary artery systolic pressure. The tricuspid regurgitant velocity is 3.07 m/s, and with an assumed right atrial pressure of 3 mmHg, the estimated right ventricular systolic pressure is 23.7 mmHg. Left Atrium: Left atrial size was moderately dilated. Right Atrium: Right atrial size was normal in size. Pericardium: A small pericardial effusion is present. Mitral Valve: The mitral valve is abnormal. Severe mitral annular calcification. Mild mitral valve regurgitation. No evidence of mitral valve stenosis. MV peak gradient, 15.7 mmHg. The mean mitral valve gradient is 7.0 mmHg. Tricuspid Valve: The tricuspid valve is normal in structure. Tricuspid valve regurgitation is mild to moderate. No evidence of tricuspid stenosis. Aortic Valve: The aortic valve is tricuspid. Aortic valve regurgitation is not visualized. Mild aortic valve sclerosis is present, with  no evidence of aortic valve stenosis. Pulmonic Valve: The pulmonic valve was normal in structure. Pulmonic valve regurgitation is not visualized. No evidence of pulmonic stenosis. Aorta: The aortic root is normal in size and structure. Venous: The inferior vena cava is normal in size with greater than 50% respiratory variability, suggesting right atrial pressure of 3 mmHg.  Additional Comments: Normal LV systolic function; severe MAC with mild MR; possible MV vegetation (suggest clinical correlation, blood cultures and TEE if clinically indicated); compared to 01/25/18 MAC is significantly worse.  LEFT VENTRICLE PLAX 2D LVIDd:         4.70 cm  Diastology LVIDs:         3.30 cm  LV e' medial:    5.84 cm/s LV PW:         1.30 cm  LV E/e' medial:  32.9 LV IVS:        0.80 cm  LV e' lateral:   5.36 cm/s LVOT diam:     1.70 cm  LV E/e' lateral: 35.8 LV SV:         59 LV SV Index:   34 LVOT Area:     2.27 cm  RIGHT VENTRICLE RV S prime:     11.00 cm/s TAPSE (M-mode): 2.3 cm LEFT ATRIUM             Index       RIGHT  ATRIUM           Index LA diam:        4.20 cm 2.43 cm/m  RA Area:     14.00 cm LA Vol (A2C):   93.4 ml 53.95 ml/m RA Volume:   34.30 ml  19.81 ml/m LA Vol (A4C):   65.5 ml 37.83 ml/m LA Biplane Vol: 80.6 ml 46.56 ml/m  AORTIC VALVE LVOT Vmax:   114.00 cm/s LVOT Vmean:  83.800 cm/s LVOT VTI:    0.258 m  AORTA Ao Root diam: 2.50 cm Ao Asc diam:  2.60 cm MITRAL VALVE                TRICUSPID VALVE MV Area (PHT): 2.99 cm     TR Peak grad:   37.7 mmHg MV Peak grad:  15.7 mmHg    TR Vmax:        307.00 cm/s MV Mean grad:  7.0 mmHg MV Vmax:       1.98 m/s     SHUNTS MV Vmean:      129.0 cm/s   Systemic VTI:  0.26 m MV Decel Time: 254 msec     Systemic Diam: 1.70 cm MR PISA:        1.01 cm MR PISA Radius: 0.40 cm MV E velocity: 192.00 cm/s MV A velocity: 172.00 cm/s MV E/A ratio:  1.12 Kirk Ruths MD Electronically signed by Kirk Ruths MD Signature Date/Time: 12/09/2020/9:31:51 AM    Final    ECHO TEE  Result Date: 12/12/2020    TRANSESOPHOGEAL ECHO REPORT   Patient Name:   Colleen [name] Date of Exam: 12/12/2020 Medical Rec #:  [id number]          Height:       64.0 in Accession #:    [id number]         Weight:       142.9 lb Date of Birth:  02-27-1961           BSA:          1.696 m Patient Age:  59 years           BP:           122/72 mmHg Patient Gender: F                  HR:           87 bpm. Exam Location:  Inpatient Procedure: 3D Echo, Transesophageal Echo, Cardiac Doppler and Color Doppler Indications:    I34.8 Other nonrheumatic mitral valve disorders  History:        Patient has prior history of Echocardiogram examinations, most                 recent 12/09/2020. Cardiomyopathy, Mitral Valve Disease; Risk                 Factors:Hypertension and Dyslipidemia. ESRD. Edema. Shock.  Sonographer:    [place] Referring Phys: [id number] [place]: After discussion of the risks and benefits of a TEE, an informed consent was obtained from the patient. The transesophogeal probe was  passed without difficulty through the esophogus of the patient. Imaged were obtained with the patient in a left lateral decubitus position. Sedation performed by different physician. The patient was monitored while under deep sedation. Anesthestetic sedation was provided intravenously by Anesthesiology: 157m of Propofol. The patient developed no complications during the procedure. IMPRESSIONS  1. Left ventricular ejection fraction, by estimation, is 55 to 60%. The left ventricle has normal function. There is mild left ventricular hypertrophy. Left ventricular diastolic parameters are indeterminate.  2. Right ventricular systolic function is moderately reduced. The right ventricular size is moderately enlarged.  3. Left atrial size was moderately dilated. No left atrial/left atrial appendage thrombus was detected.  4. Right atrial size was moderately dilated.  5. The pericardial effusion is posterior to the left ventricle.  6. Exuberant MAC particularly involving the posterior annulus with some bulky mobile areas of calcification Although this may represent a chronic form of SBE I suspect this is degenerative mitral annular calcification exuberant frequently seen in dialysis patients. . The mitral valve is degenerative. Mild mitral valve regurgitation. Severe mitral annular calcification.  7. The aortic valve is tricuspid. Aortic valve regurgitation is not visualized. Mild aortic valve stenosis.  8. PFO suggested on 3D imaging but no color flow seen. FINDINGS  Left Ventricle: Left ventricular ejection fraction, by estimation, is 55 to 60%. The left ventricle has normal function. The left ventricular internal cavity size was normal in size. There is mild left ventricular hypertrophy. Left ventricular diastolic  parameters are indeterminate. Right Ventricle: The right ventricular size is moderately enlarged. Right vetricular wall thickness was not assessed. Right ventricular systolic function is moderately reduced.  Left Atrium: Left atrial size was moderately dilated. No left atrial/left atrial appendage thrombus was detected. Right Atrium: Right atrial size was moderately dilated. Pericardium: Trivial pericardial effusion is present. The pericardial effusion is posterior to the left ventricle. Mitral Valve: Exuberant MAC particularly involving the posterior annulus with some bulky mobile areas of calcification Although this may represent a chronic form of SBE I suspect this is degenerative mitral annular calcification exuberant frequently seen  in dialysis patients. The mitral valve is degenerative in appearance. There is severe thickening of the mitral valve leaflet(s). There is severe calcification of the mitral valve leaflet(s). Severe mitral annular calcification. Mild mitral valve regurgitation. MV peak gradient, 9.5 mmHg. The mean mitral valve gradient is 5.0 mmHg. Tricuspid Valve: The tricuspid valve is normal in structure. Tricuspid  valve regurgitation is mild. There is no evidence of tricuspid valve vegetation. Aortic Valve: The aortic valve is tricuspid. Aortic valve regurgitation is not visualized. Mild aortic stenosis is present. Aortic valve mean gradient measures 2.0 mmHg. Aortic valve peak gradient measures 4.4 mmHg. There is no evidence of aortic valve vegetation. Pulmonic Valve: The pulmonic valve was normal in structure. Pulmonic valve regurgitation is mild. There is no evidence of pulmonic valve vegetation. Aorta: The aortic root is normal in size and structure. IAS/Shunts: No atrial level shunt detected by color flow Doppler. PFO suggested on 3D imaging but no color flow seen.  AORTIC VALVE AV Vmax:      105.00 cm/s AV Vmean:     67.500 cm/s AV VTI:       0.206 m AV Peak Grad: 4.4 mmHg AV Mean Grad: 2.0 mmHg MITRAL VALVE MV Area (PHT): 2.93 cm MV Peak grad:  9.5 mmHg MV Mean grad:  5.0 mmHg MV Vmax:       1.54 m/s MV Vmean:      105.0 cm/s Jenkins Rouge MD Electronically signed by Jenkins Rouge MD Signature  Date/Time: 12/12/2020/11:08:28 AM    Final     Microbiology: Recent Results (from the past 240 hour(s))  Resp Panel by RT-PCR (Flu A&B, Covid) Nasopharyngeal Swab     Status: None   Collection Time: 12/08/20  5:55 PM   Specimen: Nasopharyngeal Swab; Nasopharyngeal(NP) swabs in vial transport medium  Result Value Ref Range Status   SARS Coronavirus 2 by RT PCR NEGATIVE NEGATIVE Final    Comment: (NOTE) SARS-CoV-2 target nucleic acids are NOT DETECTED.  The SARS-CoV-2 RNA is generally detectable in upper respiratory specimens during the acute phase of infection. The lowest concentration of SARS-CoV-2 viral copies this assay can detect is 138 copies/mL. A negative result does not preclude SARS-Cov-2 infection and should not be used as the sole basis for treatment or other patient management decisions. A negative result may occur with  improper specimen collection/handling, submission of specimen other than nasopharyngeal swab, presence of viral mutation(s) within the areas targeted by this assay, and inadequate number of viral copies(<138 copies/mL). A negative result must be combined with clinical observations, patient history, and epidemiological information. The expected result is Negative.  Fact Sheet for Patients:  EntrepreneurPulse.com.au  Fact Sheet for Healthcare Providers:  IncredibleEmployment.be  This test is no t yet approved or cleared by the [place] FDA and  has been authorized for detection and/or diagnosis of SARS-CoV-2 by FDA under an Emergency Use Authorization (EUA). This EUA will remain  in effect (meaning this test can be used) for the duration of the COVID-19 declaration under Section 564(b)(1) of the Act, 21 U.S.C.section 360bbb-3(b)(1), unless the authorization is terminated  or revoked sooner.       Influenza A by PCR NEGATIVE NEGATIVE Final   Influenza B by PCR NEGATIVE NEGATIVE Final    Comment: (NOTE) The  Xpert Xpress SARS-CoV-2/FLU/RSV plus assay is intended as an aid in the diagnosis of influenza from Nasopharyngeal swab specimens and should not be used as a sole basis for treatment. Nasal washings and aspirates are unacceptable for Xpert Xpress SARS-CoV-2/FLU/RSV testing.  Fact Sheet for Patients: EntrepreneurPulse.com.au  Fact Sheet for Healthcare Providers: IncredibleEmployment.be  This test is not yet approved or cleared by the [place] FDA and has been authorized for detection and/or diagnosis of SARS-CoV-2 by FDA under an Emergency Use Authorization (EUA). This EUA will remain in effect (meaning this test can be used) for  the duration of the COVID-19 declaration under Section 564(b)(1) of the Act, 21 U.S.C. section 360bbb-3(b)(1), unless the authorization is terminated or revoked.  Performed at [facility], [place] [address] [place], [place] [address]   Culture, blood (routine x 2)     Status: None (Preliminary result)   Collection Time: 12/09/20  3:56 PM   Specimen: BLOOD RIGHT HAND  Result Value Ref Range Status   Specimen Description BLOOD RIGHT HAND  Final   Special Requests   Final    BOTTLES DRAWN AEROBIC AND ANAEROBIC Blood Culture adequate volume   Culture   Final    NO GROWTH 3 DAYS Performed at [facility], [place] [address], [place], [place] [address]    Report Status PENDING  Incomplete  MRSA PCR Screening     Status: None   Collection Time: 12/09/20  5:45 PM   Specimen: Nasopharyngeal  Result Value Ref Range Status   MRSA by PCR NEGATIVE NEGATIVE Final    Comment:        The GeneXpert MRSA Assay (FDA approved for NASAL specimens only), is one component of a comprehensive MRSA colonization surveillance program. It is not intended to diagnose MRSA infection nor to guide or monitor treatment for MRSA infections. Performed at [facility], [place] [address], [place], [place] [address]   Culture,  blood (routine x 2)     Status: None (Preliminary result)   Collection Time: 12/11/20 11:45 AM   Specimen: BLOOD  Result Value Ref Range Status   Specimen Description BLOOD RIGHT ANTECUBITAL  Final   Special Requests   Final    AEROBIC BOTTLE ONLY Blood Culture results may not be optimal due to an inadequate volume of blood received in culture bottles   Culture   Final    NO GROWTH < 24 HOURS Performed at [facility], [place] [address], [place], [place] [address]    Report Status PENDING  Incomplete  Culture, blood (routine x 2)     Status: None (Preliminary result)   Collection Time: 12/11/20 11:51 AM   Specimen: BLOOD RIGHT HAND  Result Value Ref Range Status   Specimen Description BLOOD RIGHT HAND  Final   Special Requests   Final    BOTTLES DRAWN AEROBIC AND ANAEROBIC Blood Culture adequate volume   Culture   Final    NO GROWTH < 24 HOURS Performed at [facility], [place] [address], [place], [place] [address]    Report Status PENDING  Incomplete     Labs: Basic Metabolic Panel: Recent Labs  Lab 12/08/20 1702 12/09/20 0300 12/10/20 1218  NA 137 137 136  K 3.9 3.9 3.9  CL 94* 95* 96*  CO2 _0 GLUCOSE 256* 194* 167*  BUN 28* 34* 22*  CREATININE 8.50* 9.52* 7.17*  CALCIUM 9.2 9.3 9.4  MG  --  2.1  --   PHOS  --  7.7* 5.7*   Liver Function Tests: Recent Labs  Lab 12/09/20 0300 12/10/20 1218  AST 10*  --   ALT 8  --   ALKPHOS 66  --   BILITOT 0.5  --   PROT 6.6  --   ALBUMIN 2.6* 2.6*   No results for input(s): LIPASE, AMYLASE in the last 168 hours. No results for input(s): AMMONIA in the last 168 hours. CBC: Recent Labs  Lab 12/08/20 1702 12/09/20 0300 12/10/20 1218  WBC 6.4 6.1 7.3  NEUTROABS  --  4.3  --   HGB 10.5* 9.9* 10.3*  HCT 34.1* 32.5* 32.9*  MCV 81.6 82.1 81.2  PLT 177 156 181   Cardiac Enzymes: No results for input(s): CKTOTAL, CKMB, CKMBINDEX, TROPONINI in the last 168 hours. BNP: BNP (last 3 results) No results for  input(s): BNP in the last 8760 hours.  ProBNP (last 3 results) No results for input(s): PROBNP in the last 8760 hours.  CBG: Recent Labs  Lab 12/11/20 1140 12/11/20 1714 12/11/20 2134 12/12/20 0651 12/12/20 1200  GLUCAP 177* 149* 216* 166* 129*    Signed:  Domenic Polite MD.  [facility] 12/12/2020, 2:13 PM

## 2020-12-13 ENCOUNTER — Telehealth (HOSPITAL_COMMUNITY): Payer: Self-pay | Admitting: Nephrology

## 2020-12-13 NOTE — Telephone Encounter (Signed)
Transition of care contact from inpatient facility  Date of discharge: 12/12/2020 Date of contact: 12/13/2020 Method: Phone Spoke to: Patient  Patient contacted to discuss transition of care from recent inpatient hospitalization. Patient was admitted to [facility] from 1/2 - 12/13/2020 with discharge diagnosis of acute respiratory failure/volume overload.   Medication changes were reviewed. Has all her meds except 1, will pick up this evening.  Patient will follow up with his/her outpatient HD unit on: MWF sched - tomorrow 1/7.  Wanted to verify that EDW was lowered - assured it was. Also concerned about the prescribed needle size and BFR as outpatient (350/16g), did well with BFR 400 and 15g needles during the admit - no reason why this can't be OP Rx - discussed that will get better clearance #s with faster BFR. She was satisfied with this. No further needs identified.  Veneta Penton, PA-C [employer] Pager [phone]

## 2020-12-13 NOTE — Anesthesia Postprocedure Evaluation (Signed)
Anesthesia Post Note  Patient: Abrea Henle Peil  Procedure(s) Performed: TRANSESOPHAGEAL ECHOCARDIOGRAM (TEE) (N/A )     Patient location during evaluation: PACU Anesthesia Type: MAC Level of consciousness: awake and alert Pain management: pain level controlled Vital Signs Assessment: post-procedure vital signs reviewed and stable Respiratory status: spontaneous breathing, nonlabored ventilation and respiratory function stable Cardiovascular status: stable and blood pressure returned to baseline Anesthetic complications: no   No complications documented.  Last Vitals:  Vitals:   12/12/20 1730 12/12/20 1802  BP: (!) 114/50 (!) 131/57  Pulse:  96  Resp: 18 18  Temp:  36.8 C  SpO2:  92%    Last Pain:  Vitals:   12/12/20 1802  TempSrc: Oral  PainSc:                  Audry Pili

## 2020-12-14 DIAGNOSIS — N186 End stage renal disease: Secondary | ICD-10-CM | POA: Diagnosis not present

## 2020-12-14 DIAGNOSIS — N2581 Secondary hyperparathyroidism of renal origin: Secondary | ICD-10-CM | POA: Diagnosis not present

## 2020-12-14 DIAGNOSIS — Z992 Dependence on renal dialysis: Secondary | ICD-10-CM | POA: Diagnosis not present

## 2020-12-14 DIAGNOSIS — J810 Acute pulmonary edema: Secondary | ICD-10-CM | POA: Diagnosis not present

## 2020-12-15 LAB — CULTURE, BLOOD (ROUTINE X 2)
Culture: NO GROWTH
Special Requests: ADEQUATE

## 2020-12-16 LAB — CULTURE, BLOOD (ROUTINE X 2)
Culture: NO GROWTH
Culture: NO GROWTH
Special Requests: ADEQUATE

## 2020-12-17 DIAGNOSIS — E1122 Type 2 diabetes mellitus with diabetic chronic kidney disease: Secondary | ICD-10-CM | POA: Diagnosis not present

## 2020-12-17 DIAGNOSIS — Z992 Dependence on renal dialysis: Secondary | ICD-10-CM | POA: Diagnosis not present

## 2020-12-17 DIAGNOSIS — Z7682 Awaiting organ transplant status: Secondary | ICD-10-CM | POA: Diagnosis not present

## 2020-12-17 DIAGNOSIS — E1165 Type 2 diabetes mellitus with hyperglycemia: Secondary | ICD-10-CM | POA: Diagnosis not present

## 2020-12-17 DIAGNOSIS — N2581 Secondary hyperparathyroidism of renal origin: Secondary | ICD-10-CM | POA: Diagnosis not present

## 2020-12-17 DIAGNOSIS — N186 End stage renal disease: Secondary | ICD-10-CM | POA: Diagnosis not present

## 2020-12-19 DIAGNOSIS — N2581 Secondary hyperparathyroidism of renal origin: Secondary | ICD-10-CM | POA: Diagnosis not present

## 2020-12-19 DIAGNOSIS — N186 End stage renal disease: Secondary | ICD-10-CM | POA: Diagnosis not present

## 2020-12-19 DIAGNOSIS — Z992 Dependence on renal dialysis: Secondary | ICD-10-CM | POA: Diagnosis not present

## 2020-12-21 DIAGNOSIS — N2581 Secondary hyperparathyroidism of renal origin: Secondary | ICD-10-CM | POA: Diagnosis not present

## 2020-12-21 DIAGNOSIS — N186 End stage renal disease: Secondary | ICD-10-CM | POA: Diagnosis not present

## 2020-12-21 DIAGNOSIS — Z992 Dependence on renal dialysis: Secondary | ICD-10-CM | POA: Diagnosis not present

## 2020-12-26 DIAGNOSIS — Z992 Dependence on renal dialysis: Secondary | ICD-10-CM | POA: Diagnosis not present

## 2020-12-26 DIAGNOSIS — N2581 Secondary hyperparathyroidism of renal origin: Secondary | ICD-10-CM | POA: Diagnosis not present

## 2020-12-26 DIAGNOSIS — N186 End stage renal disease: Secondary | ICD-10-CM | POA: Diagnosis not present

## 2020-12-28 DIAGNOSIS — N186 End stage renal disease: Secondary | ICD-10-CM | POA: Diagnosis not present

## 2020-12-28 DIAGNOSIS — Z992 Dependence on renal dialysis: Secondary | ICD-10-CM | POA: Diagnosis not present

## 2020-12-28 DIAGNOSIS — N2581 Secondary hyperparathyroidism of renal origin: Secondary | ICD-10-CM | POA: Diagnosis not present

## 2020-12-31 DIAGNOSIS — Z992 Dependence on renal dialysis: Secondary | ICD-10-CM | POA: Diagnosis not present

## 2020-12-31 DIAGNOSIS — N186 End stage renal disease: Secondary | ICD-10-CM | POA: Diagnosis not present

## 2020-12-31 DIAGNOSIS — N2581 Secondary hyperparathyroidism of renal origin: Secondary | ICD-10-CM | POA: Diagnosis not present

## 2021-01-02 DIAGNOSIS — N2581 Secondary hyperparathyroidism of renal origin: Secondary | ICD-10-CM | POA: Diagnosis not present

## 2021-01-02 DIAGNOSIS — N186 End stage renal disease: Secondary | ICD-10-CM | POA: Diagnosis not present

## 2021-01-02 DIAGNOSIS — Z992 Dependence on renal dialysis: Secondary | ICD-10-CM | POA: Diagnosis not present

## 2021-01-04 DIAGNOSIS — N186 End stage renal disease: Secondary | ICD-10-CM | POA: Diagnosis not present

## 2021-01-04 DIAGNOSIS — Z992 Dependence on renal dialysis: Secondary | ICD-10-CM | POA: Diagnosis not present

## 2021-01-04 DIAGNOSIS — N2581 Secondary hyperparathyroidism of renal origin: Secondary | ICD-10-CM | POA: Diagnosis not present

## 2021-01-07 DIAGNOSIS — N186 End stage renal disease: Secondary | ICD-10-CM | POA: Diagnosis not present

## 2021-01-07 DIAGNOSIS — N2581 Secondary hyperparathyroidism of renal origin: Secondary | ICD-10-CM | POA: Diagnosis not present

## 2021-01-07 DIAGNOSIS — Z992 Dependence on renal dialysis: Secondary | ICD-10-CM | POA: Diagnosis not present

## 2021-01-07 DIAGNOSIS — E1122 Type 2 diabetes mellitus with diabetic chronic kidney disease: Secondary | ICD-10-CM | POA: Diagnosis not present

## 2021-01-08 DIAGNOSIS — H3582 Retinal ischemia: Secondary | ICD-10-CM | POA: Diagnosis not present

## 2021-01-08 DIAGNOSIS — E113593 Type 2 diabetes mellitus with proliferative diabetic retinopathy without macular edema, bilateral: Secondary | ICD-10-CM | POA: Diagnosis not present

## 2021-01-09 DIAGNOSIS — N2581 Secondary hyperparathyroidism of renal origin: Secondary | ICD-10-CM | POA: Diagnosis not present

## 2021-01-09 DIAGNOSIS — Z992 Dependence on renal dialysis: Secondary | ICD-10-CM | POA: Diagnosis not present

## 2021-01-09 DIAGNOSIS — N186 End stage renal disease: Secondary | ICD-10-CM | POA: Diagnosis not present

## 2021-01-11 DIAGNOSIS — N186 End stage renal disease: Secondary | ICD-10-CM | POA: Diagnosis not present

## 2021-01-11 DIAGNOSIS — N2581 Secondary hyperparathyroidism of renal origin: Secondary | ICD-10-CM | POA: Diagnosis not present

## 2021-01-11 DIAGNOSIS — Z992 Dependence on renal dialysis: Secondary | ICD-10-CM | POA: Diagnosis not present

## 2021-01-14 DIAGNOSIS — N186 End stage renal disease: Secondary | ICD-10-CM | POA: Diagnosis not present

## 2021-01-14 DIAGNOSIS — N2581 Secondary hyperparathyroidism of renal origin: Secondary | ICD-10-CM | POA: Diagnosis not present

## 2021-01-14 DIAGNOSIS — Z992 Dependence on renal dialysis: Secondary | ICD-10-CM | POA: Diagnosis not present

## 2021-01-16 DIAGNOSIS — N2581 Secondary hyperparathyroidism of renal origin: Secondary | ICD-10-CM | POA: Diagnosis not present

## 2021-01-16 DIAGNOSIS — Z992 Dependence on renal dialysis: Secondary | ICD-10-CM | POA: Diagnosis not present

## 2021-01-16 DIAGNOSIS — N186 End stage renal disease: Secondary | ICD-10-CM | POA: Diagnosis not present

## 2021-01-18 DIAGNOSIS — N186 End stage renal disease: Secondary | ICD-10-CM | POA: Diagnosis not present

## 2021-01-18 DIAGNOSIS — Z992 Dependence on renal dialysis: Secondary | ICD-10-CM | POA: Diagnosis not present

## 2021-01-18 DIAGNOSIS — N2581 Secondary hyperparathyroidism of renal origin: Secondary | ICD-10-CM | POA: Diagnosis not present

## 2021-01-21 DIAGNOSIS — N2581 Secondary hyperparathyroidism of renal origin: Secondary | ICD-10-CM | POA: Diagnosis not present

## 2021-01-21 DIAGNOSIS — Z992 Dependence on renal dialysis: Secondary | ICD-10-CM | POA: Diagnosis not present

## 2021-01-21 DIAGNOSIS — N186 End stage renal disease: Secondary | ICD-10-CM | POA: Diagnosis not present

## 2021-01-23 DIAGNOSIS — N2581 Secondary hyperparathyroidism of renal origin: Secondary | ICD-10-CM | POA: Diagnosis not present

## 2021-01-23 DIAGNOSIS — N186 End stage renal disease: Secondary | ICD-10-CM | POA: Diagnosis not present

## 2021-01-23 DIAGNOSIS — Z992 Dependence on renal dialysis: Secondary | ICD-10-CM | POA: Diagnosis not present

## 2021-01-25 DIAGNOSIS — Z992 Dependence on renal dialysis: Secondary | ICD-10-CM | POA: Diagnosis not present

## 2021-01-25 DIAGNOSIS — N186 End stage renal disease: Secondary | ICD-10-CM | POA: Diagnosis not present

## 2021-01-25 DIAGNOSIS — N2581 Secondary hyperparathyroidism of renal origin: Secondary | ICD-10-CM | POA: Diagnosis not present

## 2021-01-28 DIAGNOSIS — Z992 Dependence on renal dialysis: Secondary | ICD-10-CM | POA: Diagnosis not present

## 2021-01-28 DIAGNOSIS — N186 End stage renal disease: Secondary | ICD-10-CM | POA: Diagnosis not present

## 2021-01-28 DIAGNOSIS — N2581 Secondary hyperparathyroidism of renal origin: Secondary | ICD-10-CM | POA: Diagnosis not present

## 2021-01-30 DIAGNOSIS — Z992 Dependence on renal dialysis: Secondary | ICD-10-CM | POA: Diagnosis not present

## 2021-01-30 DIAGNOSIS — N2581 Secondary hyperparathyroidism of renal origin: Secondary | ICD-10-CM | POA: Diagnosis not present

## 2021-01-30 DIAGNOSIS — N186 End stage renal disease: Secondary | ICD-10-CM | POA: Diagnosis not present

## 2021-02-01 DIAGNOSIS — Z992 Dependence on renal dialysis: Secondary | ICD-10-CM | POA: Diagnosis not present

## 2021-02-01 DIAGNOSIS — N2581 Secondary hyperparathyroidism of renal origin: Secondary | ICD-10-CM | POA: Diagnosis not present

## 2021-02-01 DIAGNOSIS — N186 End stage renal disease: Secondary | ICD-10-CM | POA: Diagnosis not present

## 2021-02-04 DIAGNOSIS — R110 Nausea: Secondary | ICD-10-CM | POA: Diagnosis not present

## 2021-02-04 DIAGNOSIS — E1122 Type 2 diabetes mellitus with diabetic chronic kidney disease: Secondary | ICD-10-CM | POA: Diagnosis not present

## 2021-02-04 DIAGNOSIS — N2581 Secondary hyperparathyroidism of renal origin: Secondary | ICD-10-CM | POA: Diagnosis not present

## 2021-02-04 DIAGNOSIS — Z992 Dependence on renal dialysis: Secondary | ICD-10-CM | POA: Diagnosis not present

## 2021-02-04 DIAGNOSIS — N186 End stage renal disease: Secondary | ICD-10-CM | POA: Diagnosis not present

## 2021-02-06 DIAGNOSIS — Z992 Dependence on renal dialysis: Secondary | ICD-10-CM | POA: Diagnosis not present

## 2021-02-06 DIAGNOSIS — N186 End stage renal disease: Secondary | ICD-10-CM | POA: Diagnosis not present

## 2021-02-06 DIAGNOSIS — N2581 Secondary hyperparathyroidism of renal origin: Secondary | ICD-10-CM | POA: Diagnosis not present

## 2021-02-08 DIAGNOSIS — N2581 Secondary hyperparathyroidism of renal origin: Secondary | ICD-10-CM | POA: Diagnosis not present

## 2021-02-08 DIAGNOSIS — Z992 Dependence on renal dialysis: Secondary | ICD-10-CM | POA: Diagnosis not present

## 2021-02-08 DIAGNOSIS — N186 End stage renal disease: Secondary | ICD-10-CM | POA: Diagnosis not present

## 2021-02-11 DIAGNOSIS — Z992 Dependence on renal dialysis: Secondary | ICD-10-CM | POA: Diagnosis not present

## 2021-02-11 DIAGNOSIS — N2581 Secondary hyperparathyroidism of renal origin: Secondary | ICD-10-CM | POA: Diagnosis not present

## 2021-02-11 DIAGNOSIS — N186 End stage renal disease: Secondary | ICD-10-CM | POA: Diagnosis not present

## 2021-02-12 DIAGNOSIS — T82858A Stenosis of vascular prosthetic devices, implants and grafts, initial encounter: Secondary | ICD-10-CM | POA: Diagnosis not present

## 2021-02-12 DIAGNOSIS — I871 Compression of vein: Secondary | ICD-10-CM | POA: Diagnosis not present

## 2021-02-12 DIAGNOSIS — N186 End stage renal disease: Secondary | ICD-10-CM | POA: Diagnosis not present

## 2021-02-12 DIAGNOSIS — Z992 Dependence on renal dialysis: Secondary | ICD-10-CM | POA: Diagnosis not present

## 2021-02-12 DIAGNOSIS — L309 Dermatitis, unspecified: Secondary | ICD-10-CM | POA: Diagnosis not present

## 2021-02-13 DIAGNOSIS — N2581 Secondary hyperparathyroidism of renal origin: Secondary | ICD-10-CM | POA: Diagnosis not present

## 2021-02-13 DIAGNOSIS — N186 End stage renal disease: Secondary | ICD-10-CM | POA: Diagnosis not present

## 2021-02-13 DIAGNOSIS — Z992 Dependence on renal dialysis: Secondary | ICD-10-CM | POA: Diagnosis not present

## 2021-02-15 DIAGNOSIS — Z992 Dependence on renal dialysis: Secondary | ICD-10-CM | POA: Diagnosis not present

## 2021-02-15 DIAGNOSIS — N186 End stage renal disease: Secondary | ICD-10-CM | POA: Diagnosis not present

## 2021-02-15 DIAGNOSIS — N2581 Secondary hyperparathyroidism of renal origin: Secondary | ICD-10-CM | POA: Diagnosis not present

## 2021-02-18 DIAGNOSIS — N186 End stage renal disease: Secondary | ICD-10-CM | POA: Diagnosis not present

## 2021-02-18 DIAGNOSIS — Z992 Dependence on renal dialysis: Secondary | ICD-10-CM | POA: Diagnosis not present

## 2021-02-18 DIAGNOSIS — N2581 Secondary hyperparathyroidism of renal origin: Secondary | ICD-10-CM | POA: Diagnosis not present

## 2021-02-20 DIAGNOSIS — N2581 Secondary hyperparathyroidism of renal origin: Secondary | ICD-10-CM | POA: Diagnosis not present

## 2021-02-20 DIAGNOSIS — Z992 Dependence on renal dialysis: Secondary | ICD-10-CM | POA: Diagnosis not present

## 2021-02-20 DIAGNOSIS — N186 End stage renal disease: Secondary | ICD-10-CM | POA: Diagnosis not present

## 2021-02-22 DIAGNOSIS — N2581 Secondary hyperparathyroidism of renal origin: Secondary | ICD-10-CM | POA: Diagnosis not present

## 2021-02-22 DIAGNOSIS — Z992 Dependence on renal dialysis: Secondary | ICD-10-CM | POA: Diagnosis not present

## 2021-02-22 DIAGNOSIS — N186 End stage renal disease: Secondary | ICD-10-CM | POA: Diagnosis not present

## 2021-02-25 DIAGNOSIS — N2581 Secondary hyperparathyroidism of renal origin: Secondary | ICD-10-CM | POA: Diagnosis not present

## 2021-02-25 DIAGNOSIS — N186 End stage renal disease: Secondary | ICD-10-CM | POA: Diagnosis not present

## 2021-02-25 DIAGNOSIS — Z992 Dependence on renal dialysis: Secondary | ICD-10-CM | POA: Diagnosis not present

## 2021-02-27 DIAGNOSIS — N186 End stage renal disease: Secondary | ICD-10-CM | POA: Diagnosis not present

## 2021-02-27 DIAGNOSIS — Z992 Dependence on renal dialysis: Secondary | ICD-10-CM | POA: Diagnosis not present

## 2021-02-27 DIAGNOSIS — N2581 Secondary hyperparathyroidism of renal origin: Secondary | ICD-10-CM | POA: Diagnosis not present

## 2021-03-01 DIAGNOSIS — N186 End stage renal disease: Secondary | ICD-10-CM | POA: Diagnosis not present

## 2021-03-01 DIAGNOSIS — Z992 Dependence on renal dialysis: Secondary | ICD-10-CM | POA: Diagnosis not present

## 2021-03-01 DIAGNOSIS — N2581 Secondary hyperparathyroidism of renal origin: Secondary | ICD-10-CM | POA: Diagnosis not present

## 2021-03-04 DIAGNOSIS — N2581 Secondary hyperparathyroidism of renal origin: Secondary | ICD-10-CM | POA: Diagnosis not present

## 2021-03-04 DIAGNOSIS — N186 End stage renal disease: Secondary | ICD-10-CM | POA: Diagnosis not present

## 2021-03-04 DIAGNOSIS — Z992 Dependence on renal dialysis: Secondary | ICD-10-CM | POA: Diagnosis not present

## 2021-03-06 DIAGNOSIS — N2581 Secondary hyperparathyroidism of renal origin: Secondary | ICD-10-CM | POA: Diagnosis not present

## 2021-03-06 DIAGNOSIS — N186 End stage renal disease: Secondary | ICD-10-CM | POA: Diagnosis not present

## 2021-03-06 DIAGNOSIS — Z992 Dependence on renal dialysis: Secondary | ICD-10-CM | POA: Diagnosis not present

## 2021-03-07 DIAGNOSIS — N186 End stage renal disease: Secondary | ICD-10-CM | POA: Diagnosis not present

## 2021-03-07 DIAGNOSIS — E1122 Type 2 diabetes mellitus with diabetic chronic kidney disease: Secondary | ICD-10-CM | POA: Diagnosis not present

## 2021-03-07 DIAGNOSIS — Z992 Dependence on renal dialysis: Secondary | ICD-10-CM | POA: Diagnosis not present

## 2021-03-08 DIAGNOSIS — N2581 Secondary hyperparathyroidism of renal origin: Secondary | ICD-10-CM | POA: Diagnosis not present

## 2021-03-08 DIAGNOSIS — Z992 Dependence on renal dialysis: Secondary | ICD-10-CM | POA: Diagnosis not present

## 2021-03-08 DIAGNOSIS — N186 End stage renal disease: Secondary | ICD-10-CM | POA: Diagnosis not present

## 2021-03-11 DIAGNOSIS — Z7682 Awaiting organ transplant status: Secondary | ICD-10-CM | POA: Diagnosis not present

## 2021-03-11 DIAGNOSIS — Z992 Dependence on renal dialysis: Secondary | ICD-10-CM | POA: Diagnosis not present

## 2021-03-11 DIAGNOSIS — N2581 Secondary hyperparathyroidism of renal origin: Secondary | ICD-10-CM | POA: Diagnosis not present

## 2021-03-11 DIAGNOSIS — N186 End stage renal disease: Secondary | ICD-10-CM | POA: Diagnosis not present

## 2021-03-13 DIAGNOSIS — N2581 Secondary hyperparathyroidism of renal origin: Secondary | ICD-10-CM | POA: Diagnosis not present

## 2021-03-13 DIAGNOSIS — N186 End stage renal disease: Secondary | ICD-10-CM | POA: Diagnosis not present

## 2021-03-13 DIAGNOSIS — Z992 Dependence on renal dialysis: Secondary | ICD-10-CM | POA: Diagnosis not present

## 2021-03-15 DIAGNOSIS — N2581 Secondary hyperparathyroidism of renal origin: Secondary | ICD-10-CM | POA: Diagnosis not present

## 2021-03-15 DIAGNOSIS — N186 End stage renal disease: Secondary | ICD-10-CM | POA: Diagnosis not present

## 2021-03-15 DIAGNOSIS — Z992 Dependence on renal dialysis: Secondary | ICD-10-CM | POA: Diagnosis not present

## 2021-03-18 DIAGNOSIS — N2581 Secondary hyperparathyroidism of renal origin: Secondary | ICD-10-CM | POA: Diagnosis not present

## 2021-03-18 DIAGNOSIS — Z992 Dependence on renal dialysis: Secondary | ICD-10-CM | POA: Diagnosis not present

## 2021-03-18 DIAGNOSIS — N186 End stage renal disease: Secondary | ICD-10-CM | POA: Diagnosis not present

## 2021-03-20 DIAGNOSIS — N2581 Secondary hyperparathyroidism of renal origin: Secondary | ICD-10-CM | POA: Diagnosis not present

## 2021-03-20 DIAGNOSIS — Z992 Dependence on renal dialysis: Secondary | ICD-10-CM | POA: Diagnosis not present

## 2021-03-20 DIAGNOSIS — N186 End stage renal disease: Secondary | ICD-10-CM | POA: Diagnosis not present

## 2021-03-22 DIAGNOSIS — N2581 Secondary hyperparathyroidism of renal origin: Secondary | ICD-10-CM | POA: Diagnosis not present

## 2021-03-22 DIAGNOSIS — Z992 Dependence on renal dialysis: Secondary | ICD-10-CM | POA: Diagnosis not present

## 2021-03-22 DIAGNOSIS — N186 End stage renal disease: Secondary | ICD-10-CM | POA: Diagnosis not present

## 2021-03-25 DIAGNOSIS — N186 End stage renal disease: Secondary | ICD-10-CM | POA: Diagnosis not present

## 2021-03-25 DIAGNOSIS — Z992 Dependence on renal dialysis: Secondary | ICD-10-CM | POA: Diagnosis not present

## 2021-03-25 DIAGNOSIS — N2581 Secondary hyperparathyroidism of renal origin: Secondary | ICD-10-CM | POA: Diagnosis not present

## 2021-03-27 DIAGNOSIS — Z992 Dependence on renal dialysis: Secondary | ICD-10-CM | POA: Diagnosis not present

## 2021-03-27 DIAGNOSIS — N186 End stage renal disease: Secondary | ICD-10-CM | POA: Diagnosis not present

## 2021-03-27 DIAGNOSIS — N2581 Secondary hyperparathyroidism of renal origin: Secondary | ICD-10-CM | POA: Diagnosis not present

## 2021-03-29 DIAGNOSIS — N186 End stage renal disease: Secondary | ICD-10-CM | POA: Diagnosis not present

## 2021-03-29 DIAGNOSIS — Z992 Dependence on renal dialysis: Secondary | ICD-10-CM | POA: Diagnosis not present

## 2021-03-29 DIAGNOSIS — N2581 Secondary hyperparathyroidism of renal origin: Secondary | ICD-10-CM | POA: Diagnosis not present

## 2021-04-01 DIAGNOSIS — R52 Pain, unspecified: Secondary | ICD-10-CM | POA: Diagnosis not present

## 2021-04-01 DIAGNOSIS — N2581 Secondary hyperparathyroidism of renal origin: Secondary | ICD-10-CM | POA: Diagnosis not present

## 2021-04-01 DIAGNOSIS — Z992 Dependence on renal dialysis: Secondary | ICD-10-CM | POA: Diagnosis not present

## 2021-04-01 DIAGNOSIS — N186 End stage renal disease: Secondary | ICD-10-CM | POA: Diagnosis not present

## 2021-04-02 DIAGNOSIS — J3089 Other allergic rhinitis: Secondary | ICD-10-CM | POA: Diagnosis not present

## 2021-04-02 DIAGNOSIS — J301 Allergic rhinitis due to pollen: Secondary | ICD-10-CM | POA: Diagnosis not present

## 2021-04-02 DIAGNOSIS — L299 Pruritus, unspecified: Secondary | ICD-10-CM | POA: Diagnosis not present

## 2021-04-03 DIAGNOSIS — R52 Pain, unspecified: Secondary | ICD-10-CM | POA: Diagnosis not present

## 2021-04-03 DIAGNOSIS — N2581 Secondary hyperparathyroidism of renal origin: Secondary | ICD-10-CM | POA: Diagnosis not present

## 2021-04-03 DIAGNOSIS — N186 End stage renal disease: Secondary | ICD-10-CM | POA: Diagnosis not present

## 2021-04-03 DIAGNOSIS — Z992 Dependence on renal dialysis: Secondary | ICD-10-CM | POA: Diagnosis not present

## 2021-04-05 DIAGNOSIS — N186 End stage renal disease: Secondary | ICD-10-CM | POA: Diagnosis not present

## 2021-04-05 DIAGNOSIS — R52 Pain, unspecified: Secondary | ICD-10-CM | POA: Diagnosis not present

## 2021-04-05 DIAGNOSIS — Z992 Dependence on renal dialysis: Secondary | ICD-10-CM | POA: Diagnosis not present

## 2021-04-05 DIAGNOSIS — N2581 Secondary hyperparathyroidism of renal origin: Secondary | ICD-10-CM | POA: Diagnosis not present

## 2021-04-06 DIAGNOSIS — Z992 Dependence on renal dialysis: Secondary | ICD-10-CM | POA: Diagnosis not present

## 2021-04-06 DIAGNOSIS — N186 End stage renal disease: Secondary | ICD-10-CM | POA: Diagnosis not present

## 2021-04-06 DIAGNOSIS — E1122 Type 2 diabetes mellitus with diabetic chronic kidney disease: Secondary | ICD-10-CM | POA: Diagnosis not present

## 2021-04-08 DIAGNOSIS — R52 Pain, unspecified: Secondary | ICD-10-CM | POA: Diagnosis not present

## 2021-04-08 DIAGNOSIS — N2581 Secondary hyperparathyroidism of renal origin: Secondary | ICD-10-CM | POA: Diagnosis not present

## 2021-04-08 DIAGNOSIS — N186 End stage renal disease: Secondary | ICD-10-CM | POA: Diagnosis not present

## 2021-04-08 DIAGNOSIS — Z992 Dependence on renal dialysis: Secondary | ICD-10-CM | POA: Diagnosis not present

## 2021-04-10 DIAGNOSIS — N186 End stage renal disease: Secondary | ICD-10-CM | POA: Diagnosis not present

## 2021-04-10 DIAGNOSIS — Z992 Dependence on renal dialysis: Secondary | ICD-10-CM | POA: Diagnosis not present

## 2021-04-10 DIAGNOSIS — R52 Pain, unspecified: Secondary | ICD-10-CM | POA: Diagnosis not present

## 2021-04-10 DIAGNOSIS — N2581 Secondary hyperparathyroidism of renal origin: Secondary | ICD-10-CM | POA: Diagnosis not present

## 2021-04-12 DIAGNOSIS — N2581 Secondary hyperparathyroidism of renal origin: Secondary | ICD-10-CM | POA: Diagnosis not present

## 2021-04-12 DIAGNOSIS — Z992 Dependence on renal dialysis: Secondary | ICD-10-CM | POA: Diagnosis not present

## 2021-04-12 DIAGNOSIS — R52 Pain, unspecified: Secondary | ICD-10-CM | POA: Diagnosis not present

## 2021-04-12 DIAGNOSIS — N186 End stage renal disease: Secondary | ICD-10-CM | POA: Diagnosis not present

## 2021-05-07 DIAGNOSIS — N186 End stage renal disease: Secondary | ICD-10-CM | POA: Diagnosis not present

## 2021-05-07 DIAGNOSIS — E1122 Type 2 diabetes mellitus with diabetic chronic kidney disease: Secondary | ICD-10-CM | POA: Diagnosis not present

## 2021-05-07 DIAGNOSIS — Z992 Dependence on renal dialysis: Secondary | ICD-10-CM | POA: Diagnosis not present

## 2021-05-18 IMAGING — CT CT ANGIO CHEST
2 of 6 series · 19 of 36 positions shown · IV contrast (omnipaque)
Comparison: None.

CLINICAL DATA: Shortness of breath

EXAM:
CT ANGIOGRAPHY CHEST WITH CONTRAST
TECHNIQUE: Multidetector CT imaging of the chest was performed using the
standard protocol during bolus administration of intravenous
contrast. Multiplanar CT image reconstructions and MIPs were
obtained to evaluate the vascular anatomy.
CONTRAST:  100mL OMNIPAQUE IOHEXOL 350 MG/ML SOLN

[Series 6: pe 2mm cor · coronal · 0.66mm/px · 1 of 142 slices shown]
[im 71/142  mediastinal]
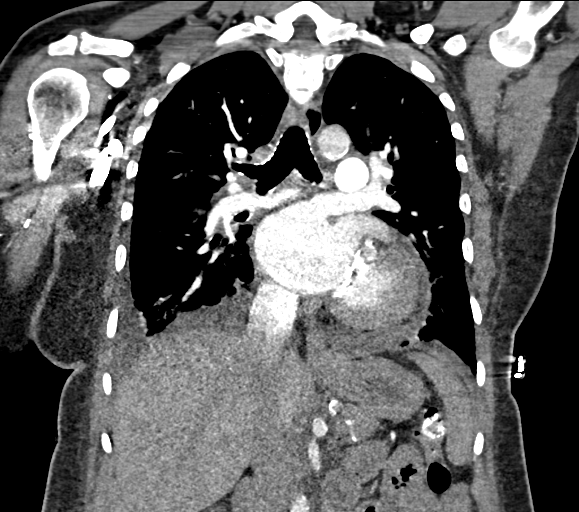

[Series 11: pe thins · axial · 0.76mm/px · z∈[-257,+46]mm · 18 of 481 slices shown]
[im 25/481  lung]
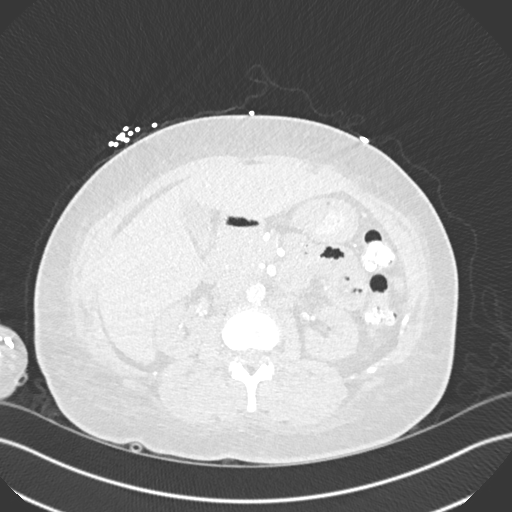
[im 49/481  mediastinal]
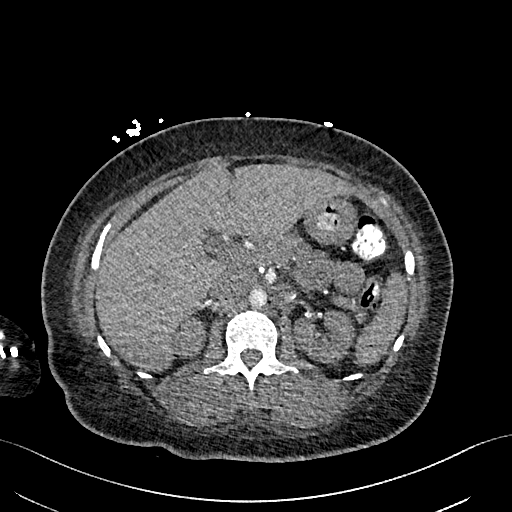
[im 73/481  lung]
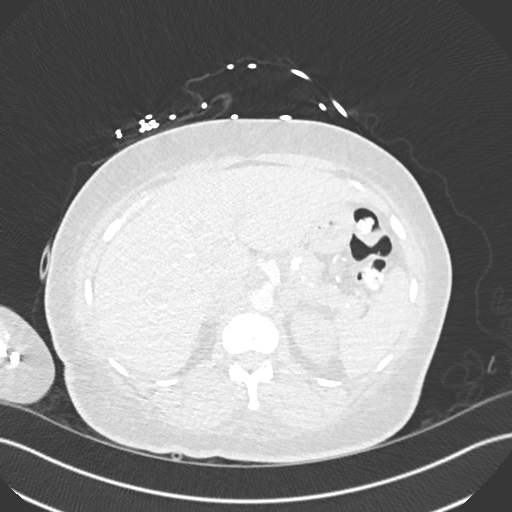
[im 97/481  mediastinal]
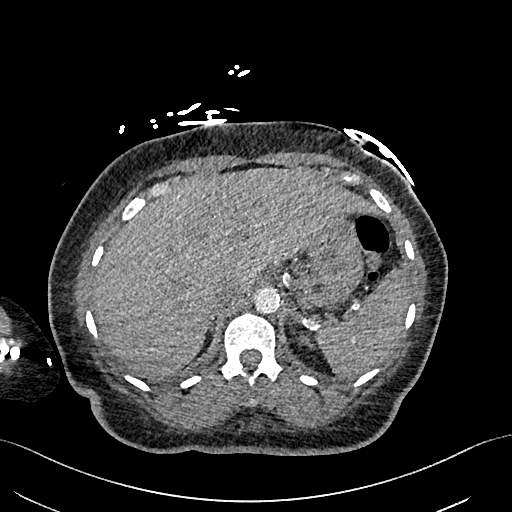
[im 121/481  lung]
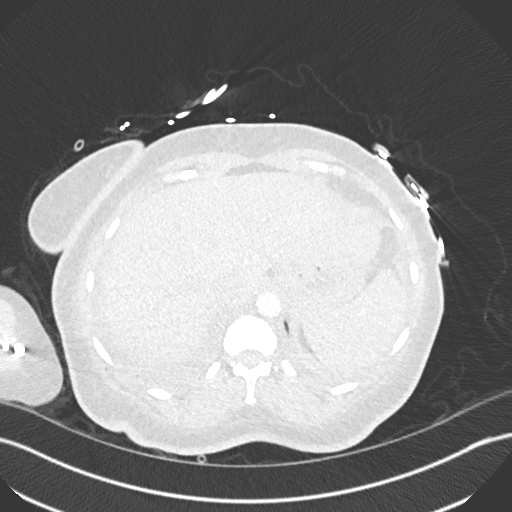
[im 145/481  mediastinal]
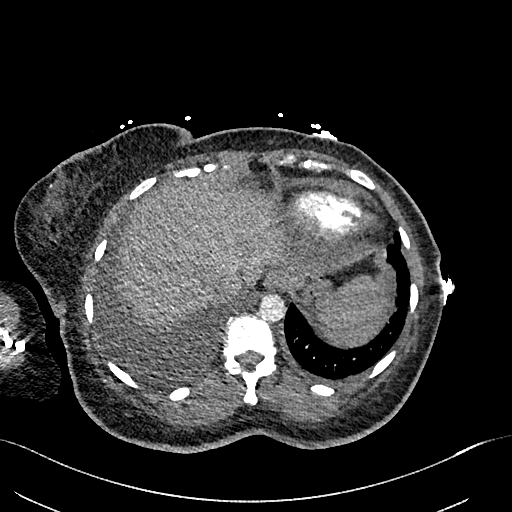
[im 169/481  lung]
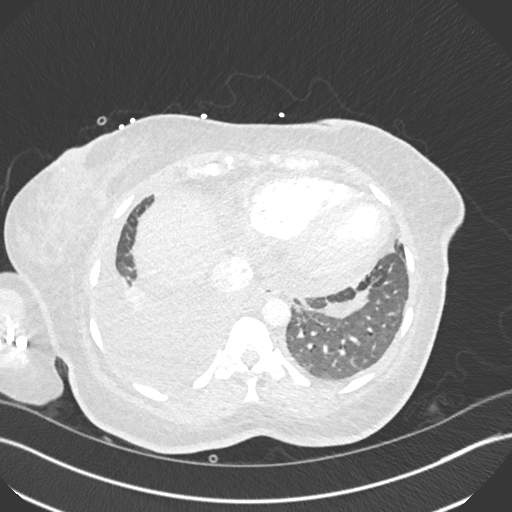
[im 193/481  mediastinal]
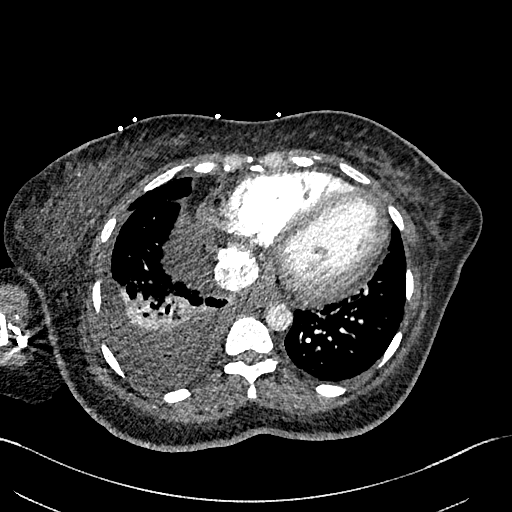
[im 217/481  lung]
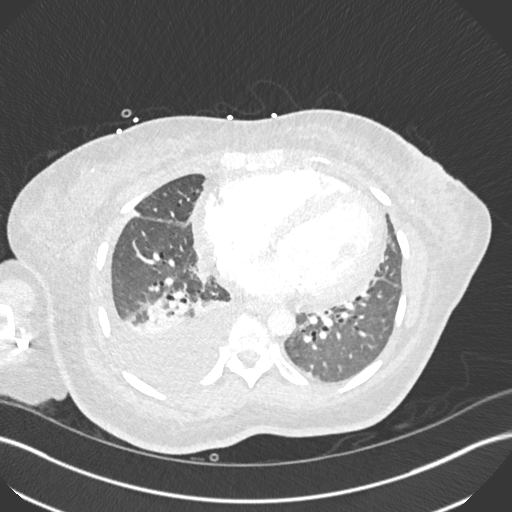
[im 265/481  mediastinal]
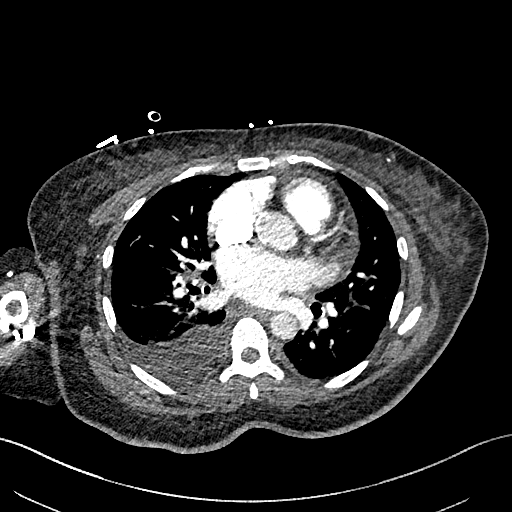
[im 289/481  lung]
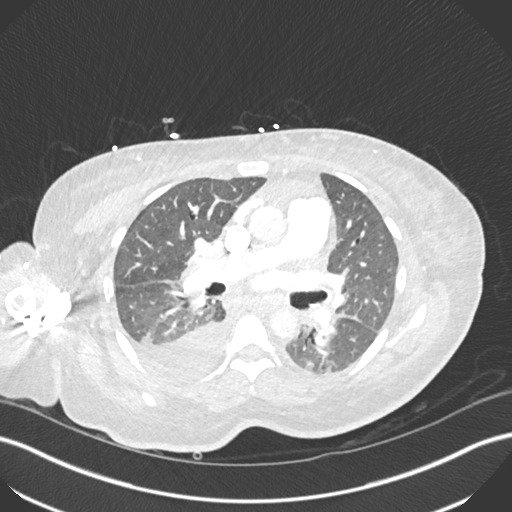
[im 313/481  mediastinal]
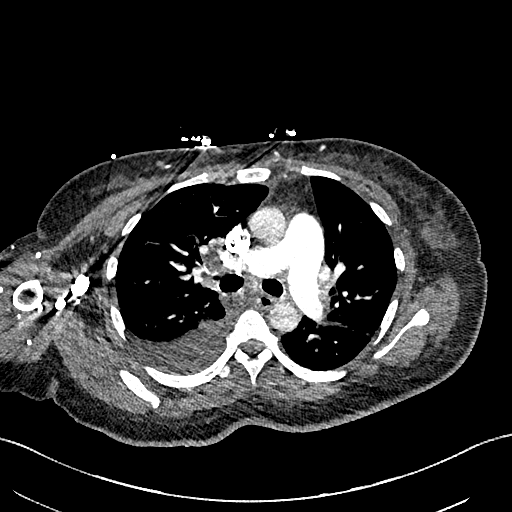
[im 337/481  lung]
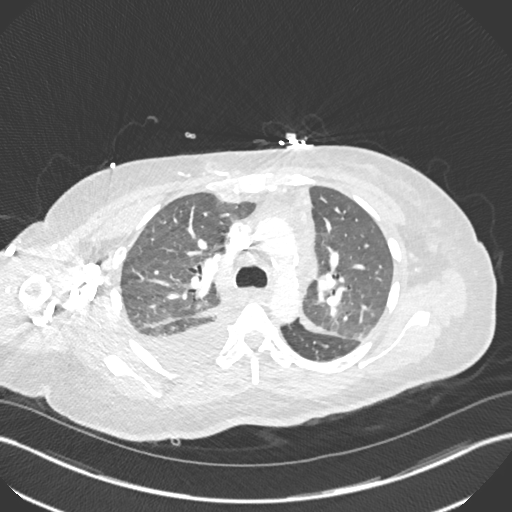
[im 361/481  mediastinal]
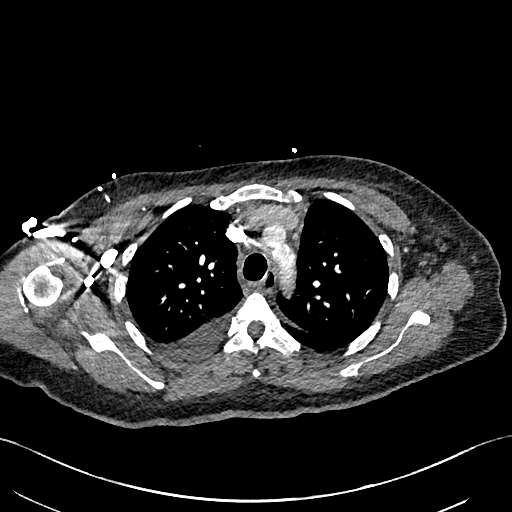
[im 385/481  lung]
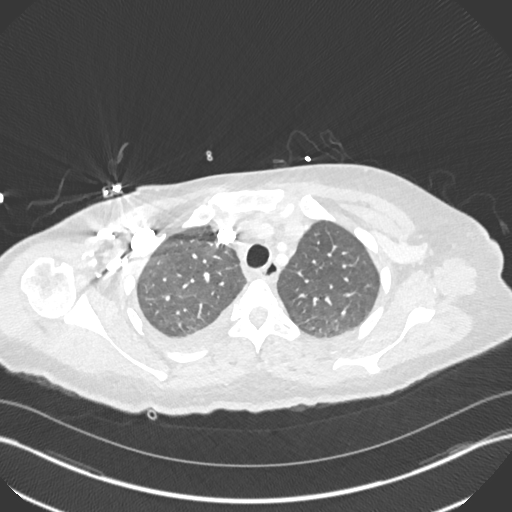
[im 409/481  mediastinal]
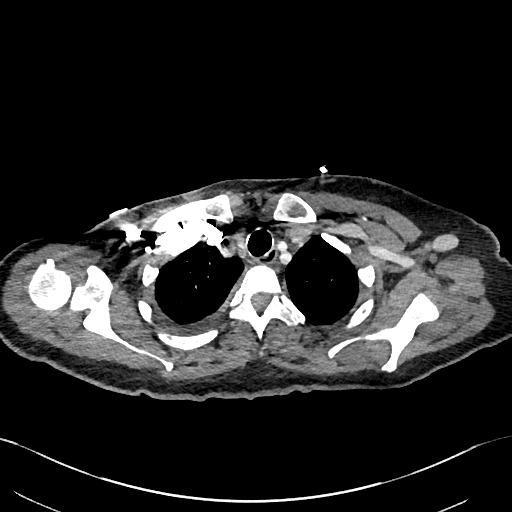
[im 433/481  lung]
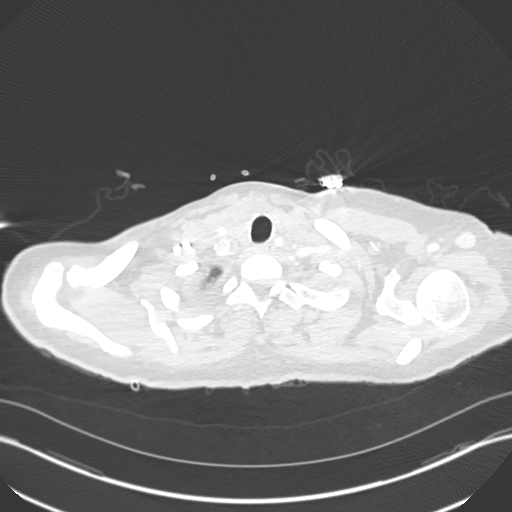
[im 457/481  mediastinal]
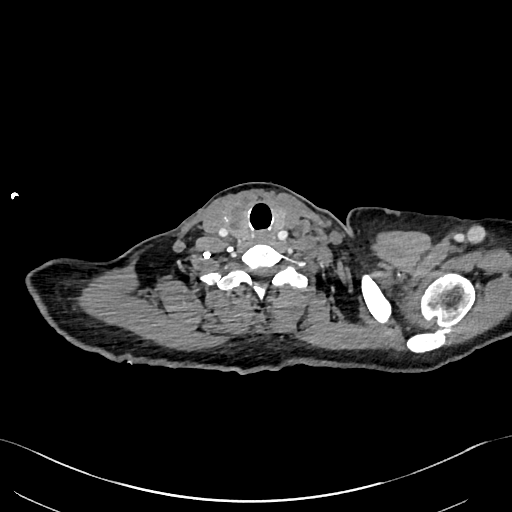

[19 of 36 positions shown; findings below may reference images not displayed]

FINDINGS: Cardiovascular: There is a optimal opacification of the pulmonary
arteries. There is no central,segmental, or subsegmental filling
defects within the pulmonary arteries. The heart is normal in size.
There is a small pericardial effusion present. No evidence right
heart strain. There is normal three-vessel brachiocephalic anatomy
without proximal stenosis. Aortic atherosclerosis noted. Coronary
artery calcifications are seen.

Mediastinum/Nodes: No hilar, mediastinal, or axillary adenopathy.
Thyroid gland, trachea, and esophagus demonstrate no significant
findings.

Lungs/Pleura: There is a right and trace effusion present. Patchy
airspace opacity seen at the posterior right base. Interlobular
septal thickening seen within.

Upper Abdomen: No acute abnormalities present in the visualized
portions of the upper abdomen.

Musculoskeletal: No chest wall abnormality. No acute or significant
osseous findings.

Review of the MIP images confirms the above findings.
IMPRESSION: No central, segmental subsegmental.

Small right and trace left effusion.

Findings which may be suggestive interstitial edema.

Aortic Atherosclerosis ([id number]).

## 2021-06-28 ENCOUNTER — Other Ambulatory Visit: Payer: Self-pay

## 2021-06-28 ENCOUNTER — Telehealth: Payer: Self-pay | Admitting: General Surgery

## 2021-06-28 ENCOUNTER — Ambulatory Visit (INDEPENDENT_AMBULATORY_CARE_PROVIDER_SITE_OTHER): Payer: Federal, State, Local not specified - PPO | Admitting: Gastroenterology

## 2021-06-28 ENCOUNTER — Encounter: Payer: Self-pay | Admitting: Gastroenterology

## 2021-06-28 VITALS — BP 138/70 | HR 94 | Ht 64.0 in | Wt 147.5 lb

## 2021-06-28 DIAGNOSIS — R112 Nausea with vomiting, unspecified: Secondary | ICD-10-CM

## 2021-06-28 DIAGNOSIS — E1159 Type 2 diabetes mellitus with other circulatory complications: Secondary | ICD-10-CM

## 2021-06-28 DIAGNOSIS — N186 End stage renal disease: Secondary | ICD-10-CM

## 2021-06-28 DIAGNOSIS — Z992 Dependence on renal dialysis: Secondary | ICD-10-CM

## 2021-06-28 MED ORDER — ONDANSETRON HCL 4 MG PO TABS: 4.0000 mg | ORAL_TABLET | Freq: Four times a day (QID) | ORAL | 3 refills | Status: DC | PRN

## 2021-06-28 NOTE — Progress Notes (Signed)
Chief Complaint:    Nausea/vomiting  GI History: 60 year old female with a history of ESRD on HD MWF, diabetes, prior DVT/PE (apixaban discontinued due to recurrent bleeding in eye), hypertension, hyperlipidemia, PVD, chronic anemia.  Follows with Transplant Nephrology and Hematology at [facility].  Endoscopic History: - Colonoscopy (02/2020): 2 right-sided hyperplastic polyps, 3 left-sided tubular adenomas, one rectal hyperplastic polyp.  Internal hemorrhoids.  Normal TI.  Repeat in 3 years  HPI:     Patient is a 60 y.o. female presenting to the Gastroenterology Clinic for follow-up.  Initially seen by me on 02/08/2020 for colonoscopy for routine CRC screening along with preoperative evaluation prior to kidney transplant.  Today, she presents with nausea for the last 6 months.  Intermittent episodes of nonbloody emesis. Can have n/v immediately after eating or several hours after meals. Can have sxs in the middle of the night with sudden onset emesis (but the n/v is not why she wakes up; long history of poor sleep habits). Typically bilious emesis. Denies heartburn, regurgitation. No dysphagia.   Aside from eating slower and chewing more, no modifications.  Has not trialed any medications for this.  Did have HD earlier today.   Of note, she stopped taking all her BP meds.   Review of systems:     No chest pain, no SOB, no fevers, no urinary sx   Past Medical History:  Diagnosis Date   Anemia    Chronic kidney disease    ESRD [place] -   Diabetes ([place])    type II   DVT (deep venous thrombosis) ([facility])    E-coli UTI    Gout 06/05/2018   History of blood transfusion    HLD (hyperlipidemia) 06/05/2018   Hypertension    Kidney failure    PE (pulmonary embolism)    Pulled muscle    pt has right sided facial droop from pulled muscle in face since birth    Patient's surgical history, family medical history, social history, medications and allergies were all reviewed in Epic    Current  Outpatient Medications  Medication Sig Dispense Refill   AURYXIA 1 GM 210 MG(Fe) tablet Take 2 tablets by mouth 2 (two) times daily with a meal.     HUMALOG KWIKPEN 100 UNIT/ML KwikPen Inject 1-3 Units into the skin See admin instructions. Inject 1-3 units into the skin once a day, PER SLIDING SCALE     Insulin Glargine (BASAGLAR KWIKPEN) 100 UNIT/ML Inject 5 Units into the skin at bedtime.     lanthanum (FOSRENOL) 1000 MG chewable tablet Chew 1,000 mg by mouth 3 (three) times daily with meals.     No current facility-administered medications for this visit.    Physical Exam:     BP 138/70   Pulse 94   Ht 5\' 4"  (1.626 m)   Wt 147 lb 8 oz (66.9 kg)   LMP 12/08/2013   SpO2 96%   BMI 25.32 kg/m   GENERAL:  Pleasant female in NAD PSYCH: : Cooperative, normal affect EENT:  conjunctiva pink, mucous membranes moist, neck supple without masses CARDIAC:  RRR, loud murmur c/w AV fistula, no peripheral edema PULM: Normal respiratory effort, lungs CTA bilaterally, no wheezing ABDOMEN:  Nondistended, soft, nontender. No obvious masses, no hepatomegaly,  normal bowel sounds SKIN:  turgor, no lesions seen Musculoskeletal:  Normal muscle tone, normal strength NEURO: Alert and oriented x 3, no focal neurologic deficits   IMPRESSION and PLAN:    1) Nausea/Vomiting 2) Diabetes with peripheral  vascular disease 3) ESRD on HD  - Trial of Zofran ODT 4 mg prn every 6 hours - Gastric emptying study - Holding off on trial of Reglan pending GES results - Recommended low-fat/low fiber diet with small, frequent meals, using liquid meal replacement for 2 of those meals daily - If above work-up unrvealing and no change in symptoms, will plan on EGD +/- cross sectional imaging  5) History of colon polyps - Repeat colonoscopy in 2024 for ongoing polyp surveillance  I spent 32 minutes of time, including in depth chart review, independent review of results as outlined above, communicating results with  the patient directly, face-to-face time with the patient, coordinating care, and ordering studies and medications as appropriate, and documentation.          Colleen [name] ,DO, FACG 06/28/2021, 1:31 PM

## 2021-06-28 NOTE — Telephone Encounter (Signed)
error 

## 2021-06-28 NOTE — Telephone Encounter (Signed)
Left a detailed vm that gastric emptying study was scheduled on 07/19/2021 at 730 arrive at [facility] at 815. Patient was given instructions on her AVS

## 2021-06-28 NOTE — Patient Instructions (Addendum)
If you are age 59 or older, your body mass index should be between 23-30. Your Body mass index is 25.32 kg/m. If this is out of the aforementioned range listed, please consider follow up with your Primary Care Provider.  If you are age 54 or younger, your body mass index should be between 19-25. Your Body mass index is 25.32 kg/m. If this is out of the aformentioned range listed, please consider follow up with your Primary Care Provider.   You will be contacted by [place] in the next 2 days to arrange a Gastric emptying study  The number on your caller ID will be [phone], please answer when they call.  If you have not heard from them in 2 days please call [phone] to schedule.     Due to recent changes in healthcare laws, you may see the results of your imaging and laboratory studies on MyChart before your provider has had a chance to review them.  We understand that in some cases there may be results that are confusing or concerning to you. Not all laboratory results come back in the same time frame and the provider may be waiting for multiple results in order to interpret others.  Please give [name] 48 hours in order for your provider to thoroughly review all the results before contacting the office for clarification of your results.   Thank you for choosing me and [facility].  Colleen [name], D.O. __________________________________________________________  Colleen [name] have been scheduled for a gastric emptying scan at [facility] on _____________________ at ____________. Please arrive at least 15 minutes prior to your appointment for registration. Please make certain not to have anything to eat or drink after midnight the night before your test. Hold all stomach medications (ex: Zofran, phenergan, Reglan) 48 hours prior to your test. If you need to reschedule your appointment, please contact radiology scheduling at  [phone]. _____________________________________________________________________ A gastric-emptying study measures how long it takes for food to move through your stomach. There are several ways to measure stomach emptying. In the most common test, you eat food that contains a small amount of radioactive material. A scanner that detects the movement of the radioactive material is placed over your abdomen to monitor the rate at which food leaves your stomach. This test normally takes about 4 hours to complete. _____________________________________________________________________

## 2021-07-19 ENCOUNTER — Other Ambulatory Visit (HOSPITAL_COMMUNITY): Payer: Medicare Other

## 2021-07-23 ENCOUNTER — Other Ambulatory Visit: Payer: Self-pay

## 2021-07-23 ENCOUNTER — Ambulatory Visit (HOSPITAL_COMMUNITY)
Admission: RE | Admit: 2021-07-23 | Discharge: 2021-07-23 | Disposition: A | Discharge status: Home / Self Care | Payer: Federal, State, Local not specified - PPO | Source: Ambulatory Visit | Attending: Gastroenterology | Admitting: Gastroenterology

## 2021-07-23 DIAGNOSIS — R112 Nausea with vomiting, unspecified: Secondary | ICD-10-CM | POA: Insufficient documentation

## 2021-07-23 MED ORDER — TECHNETIUM TC 99M SULFUR COLLOID
1.9000 | Freq: Once | INTRAVENOUS | Status: AC
  Administered 2021-07-23: 1.9 via INTRAVENOUS

## 2021-08-23 ENCOUNTER — Encounter (HOSPITAL_COMMUNITY): Payer: Self-pay

## 2022-09-10 ENCOUNTER — Ambulatory Visit: Payer: Self-pay | Admitting: Licensed Clinical Social Worker

## 2022-09-10 NOTE — Patient Outreach (Signed)
  Care Coordination   09/10/2022 Name: LAKETA SANDOZ MRN: [id number] DOB: Mar 31, 1961   Care Coordination Outreach Attempts:  An unsuccessful telephone outreach was attempted today to offer the patient information about available care coordination services as a benefit of their health plan.   Follow Up Plan:  Additional outreach attempts will be made to offer the patient care coordination information and services.   Encounter Outcome:  No Answer  Care Coordination Interventions Activated:  No   Care Coordination Interventions:  No, not indicated    Lenor Derrick , MSW Social Worker [facility]  [phone]

## 2022-09-11 ENCOUNTER — Ambulatory Visit: Payer: Self-pay | Admitting: Licensed Clinical Social Worker

## 2022-09-11 NOTE — Patient Instructions (Signed)
Visit Information  Thank you for taking time to visit with me today. Please don't hesitate to contact me if I can be of assistance to you.   Following are the goals we discussed today:   Goals Addressed               This Visit's Progress     Care Coordination Activities- Patient declined (pt-stated)        Care Coordination Interventions: Discussed plans with patient for ongoing care management follow up and provided patient with direct contact information for care management team Assessed social determinant of health barriers  Active listening / Reflection utilized            Patient verbalizes understanding of instructions and care plan provided today and agrees to view in [place]. Active MyChart status and patient understanding of how to access instructions and care plan via MyChart confirmed with patient.     No further follow up required: .  Lenor Derrick, MSW  Social Worker [facility] Care Management  [phone]

## 2022-09-11 NOTE — Patient Outreach (Signed)
  Care Coordination   Initial Visit Note   09/11/2022 Name: DOMENICA WEIGHTMAN MRN: [id number] DOB: 18-Jan-1961  Marcille Blanco Blecha is a 61 y.o. year old female who sees [place], [place], [place] for primary care. I spoke with  Marcille Blanco Mcgrady by phone today.  What matters to the patients health and wellness today?  Patient declined     Goals Addressed               This Visit's Progress     Care Coordination Activities- Patient declined (pt-stated)        Care Coordination Interventions: Discussed plans with patient for ongoing care management follow up and provided patient with direct contact information for care management team Assessed social determinant of health barriers  Active listening / Reflection utilized          SDOH assessments and interventions completed:  Yes     Care Coordination Interventions Activated:  Yes  Care Coordination Interventions:  Yes, provided   Follow up plan: No further intervention required.   Encounter Outcome:  Pt. Visit Completed

## 2023-03-19 ENCOUNTER — Encounter: Payer: Self-pay | Admitting: *Deleted

## 2023-03-19 NOTE — PMR Pre-admission (Signed)
PMR Admission Coordinator Pre-Admission Assessment  Patient: Colleen [name] is an 62 y.o., female MRN: [id number] DOB: 07-16-1961 Height:   Weight:    Insurance Information HMO:     PPO:      PCP:      IPA:      80/20:      OTHER:  PRIMARY: Medicare a and b      Policy#: [id number]      Subscriber: pt Benefits:  Phone #: passport one source online     Name: 4/10 Eff. Date: 03/09/2019     Deduct: $1632      Out of Pocket Max: none      Life Max: none CIR: 100%      SNF: 20 full days Outpatient: 80%     Co-Pay: 20% Home Health: 100%      Co-Pay: none DME: 80%     Co-Pay: 20% Providers: in network  SECONDARY: Sharen Counter      Policy#: [id number]  Financial Counselor:       Phone#:   The "Data Collection Information Summary" for patients in Inpatient Rehabilitation Facilities with attached "Privacy Act Statement-Health Care Records" was provided and verbally reviewed with: Patient  Emergency Contact Information Contact Information     Name Relation Home Work Mobile   Colleen [name],Colleen [name] Brother   [phone]   Colleen [name], Colleen [name] Sister   [phone]   Colleen [name]   [phone]   Colleen [name], Colleen [name] Sister   [phone]      Current Medical History  Patient Admitting Diagnosis: MVR, debility  History of Present Illness: 62 year old female with history of ESRD on hemodialysis , right nerve palsy, HTN, HL, T2 DM, history of DVT ( 2016) and chronic anemia. Recent evaluation for kidney transplant 11/25/22 with echo demonstrated normal LV function, moderate RV dysfunction , severe MR with moderate mitral stenosis and severe TR with calcified nodule noted on AV. Cardiac cath 2/20 demonstrated minimal CAD, mod pulm HTN, normal filling pressures and severe MR with moderate to severe mitral stenosis. [facility]23 for surgical repair.   On 02/19/23 underwent Mitral valve replacement, TV ring, AV decalcification with removal of LV mass. Complete heart  block and postoperative atrial fibrillation. S/p STJ/Abbott leadless PPM placement 3/21. Afib treated with amiodarone load. Monitor labs and supplementing electrolytes as needed. Initially on CRRT and transitioned to IHD on 3/26. Some SOB and problems with UF on hemodialysis.  Hypotension with treatment with midodrine and Strattera and Droxidopa as needed. Beta blocker contraindicated due to hypotension. Anticoagulation with coumadin and Asa. Postop AMS with trazodone and melatonin eventually held as well as narcotics. Now resolved. Angioedema treated with decadron for initial 24 hrs on 3/15. Leukocytosis treated with empiric Cefepime and Vanc. Diarrhea resolving with cdiff negative and now removal of bowel tube to control diarrhea. Poor po intake. Initially treated with TF, now removed 3/28. Nutrition consulted . Continue regular diet, Nova source renal supplements and Renavite supplements. Pain management with lidocaine patches and prn tylenol holding narcotics. Endocrine consulted or management of DM.  Patient's medical record from [facility] has been reviewed by the rehabilitation admission coordinator and physician.  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 Yes  Family History   family history is not on file.  Current Medications  Current Outpatient Medications:    AURYXIA 1 GM 210 MG(Fe) tablet, Take 2 tablets by mouth 2 (two) times daily with a meal., Disp: , Rfl:    HUMALOG KWIKPEN 100 UNIT/ML KwikPen,  Inject 1-3 Units into the skin See admin instructions. Inject 1-3 units into the skin once a day, PER SLIDING SCALE, Disp: , Rfl:    Insulin Glargine (BASAGLAR KWIKPEN) 100 UNIT/ML, Inject 5 Units into the skin at bedtime., Disp: , Rfl:    lanthanum (FOSRENOL) 1000 MG chewable tablet, Chew 1,000 mg by mouth 3 (three) times daily with meals., Disp: , Rfl:    ondansetron (ZOFRAN) 4 MG tablet, Take 1 tablet (4 mg total) by mouth every 6 (six) hours as needed for nausea or vomiting., Disp: 60 tablet, Rfl: 3  Patients Current Diet: Diet Regular Renal diet  Precautions / Restrictions Precautions: Sternal Weight Bearing Restrictions: No Other Position/Activity Restrictions: sternal precautions   Has the patient had 2 or more falls or a fall with injury in the past year? No  Prior Activity Level Community (5-7x/wk): Independent without AD; driving  Prior Functional Level Self Care: Did the patient need help bathing, dressing, using the toilet or eating? Independent  Indoor Mobility: Did the patient need assistance with walking from room to room (with or without device)? Independent  Stairs: Did the patient need assistance with internal or external stairs (with or without device)? Independent  Functional Cognition: Did the patient need help planning regular tasks such as shopping or remembering to take medications? Independent  Patient Information Are you of Hispanic, Latino/a,or Spanish origin?: A. No, not of Hispanic, Latino/a, or Spanish origin What is your race?: B. Black or African American Do you need or want an interpreter to communicate with a doctor or health care staff?: 0. No  Patient's Response To:  Health Literacy and Transportation Is the patient able to respond to health literacy and transportation needs?: Yes Health Literacy - How often do you need to have someone help you when you read instructions, pamphlets, or other written material from your doctor or pharmacy?:  Never In the past 12 months, has lack of transportation kept you from medical appointments or from getting medications?: No In the past 12 months, has lack of transportation kept you from meetings, work, or from getting things needed for daily living?: No  Home Assistive Devices / Equipment None  Prior Device Use: Indicate devices/aids used by the patient prior to current illness, exacerbation or injury? None of the above   Prior Functional Level Current Functional Level  Bed Mobility  Independent Min assist   Transfers  Independent  Mod assist   Mobility - Walk/Wheelchair  Independent  Mod assist (with swedish walker to chair)   Upper Body Dressing  Independent  Mod assist   Lower Body Dressing  Independent  Max assist   Grooming  Independent  Min assist   Eating/Drinking  Independent  Mod Independent   Toilet Transfer  Independent  Mod assist   Bladder Continence    Anuric   Anuric   Bowel  Management   continent    Resolving diarhea  Stair Climbing  Independent  -- (not attempted)   Communication  independent  independent   Memory  intact intact     Special Needs/ Care Considerations Hemodialysis MWF outpatient [facility]; drove self  Previous Home Environment  Living Arrangements: Alone  Lives With: Alone Available Help at Discharge: Family; Available 24 hours/day (boyfriend's sister to assist 24/7 in her home at discharge) Type of Home: Apartment Home Layout: One level Alternate Level Stairs-Rails: -- (flight) Home Access: Level entry Bathroom Shower/Tub: Engineer, manufacturing systems: Standard Bathroom Accessibility: Yes How Accessible: Accessible via wheelchair Home Care Services: No Additional Comments: was very active prior to 6 to 9 months ago but developed SOB and fatigue; was cycling on stationary bike in the past  Discharge Living Setting Plans for Discharge Living Setting: Patient's home; Alone;  Apartment Type of Home at Discharge: Apartment Discharge Home Layout: One level Alternate Level Stairs-Number of Steps: none Discharge Home Access: Level entry Discharge Bathroom Shower/Tub: Tub/shower unit Discharge Bathroom Toilet: Standard Discharge Bathroom Accessibility: Yes How Accessible: Accessible via wheelchair Does the patient have any problems obtaining your medications?: No  Social/Family/Support Systems Contact Information: brother Colleen [name] and friend, Colleen [name] Anticipated Caregiver: Erics's sister Colleen [name] Anticipated Caregiver's Contact Information: see contacts with Colleen [name] Ability/Limitations of Caregiver: can provide 24/7 asisst initially Caregiver Availability: 24/7 Discharge Plan Discussed with Primary Caregiver: Yes Is Caregiver In Agreement with Plan?: Yes Does Caregiver/Family have Issues with Lodging/Transportation while Pt is in Rehab?: No  Goals Patient/Family Goal for Rehab: supervision to min assist with PT and OT Expected length of stay: ELOS 10 to 14 days Pt/Family Agrees to Admission and willing to participate: Yes Program Orientation Provided & Reviewed with Pt/Caregiver Including Roles  & Responsibilities: Yes Additional Information Needs: new address is [address] [place], [place]  Decrease burden of Care through IP rehab admission: n/a  Possible need for SNF placement upon discharge: not anticipated  Patient Condition: I have reviewed medical records from [facility], spoken with CM, and patient and family member. I discussed via phone for inpatient rehabilitation assessment.  Patient will benefit from ongoing PT and OT, can actively participate in 3 hours of therapy a day 5 days of the week, and can make measurable gains during the admission.  Patient will also benefit from the coordinated team approach during an Inpatient Acute Rehabilitation admission.  The patient will receive intensive therapy as well as Rehabilitation physician,  nursing, social worker, and care management interventions.  Due to bladder management, bowel management, safety, skin/wound care, disease management, medication administration, pain management, and patient education the patient requires 24 hour a day rehabilitation nursing.  The patient is currently mod assist overall with mobility and basic ADLs.  Discharge setting and therapy post discharge at home with home health is anticipated.  Patient has agreed to participate in the Acute Inpatient Rehabilitation Program and will admit today.  Preadmission Screen Completed By:  Clois Dupes, RN MSN 03/20/2023 4:57 PM ______________________________________________________________________   Discussed status with Dr. Marland Kitchen on *** at *** and received approval for admission today.  Admission Coordinator:  Clois Dupes, RN MSN time Marland KitchenDorna Bloom ***   Assessment/Plan: Diagnosis: Does the need for close, 24 hr/day Medical supervision in concert with the patient's rehab needs make it unreasonable for this patient to be served in a less intensive setting? {yes_no_potentially:3041433} Co-Morbidities requiring supervision/potential complications: *** Due to {due ZO:1096045}, does  the patient require 24 hr/day rehab nursing? {yes_no_potentially:3041433} Does the patient require coordinated care of a physician, rehab nurse, PT, OT, and SLP to address physical and functional deficits in the context of the above medical diagnosis(es)? {yes_no_potentially:3041433} Addressing deficits in the following areas: {deficits:3041436} Can the patient actively participate in an intensive therapy program of at least 3 hrs of therapy 5 days a week? {yes_no_potentially:3041433} The potential for patient to make measurable gains while on inpatient rehab is {potential:3041437} Anticipated functional outcomes upon discharge from inpatient rehab: {functional outcomes:304600100} PT, {functional outcomes:304600100} OT,  {functional outcomes:304600100} SLP Estimated rehab length of stay to reach the above functional goals is: *** Anticipated discharge destination: {anticipated dc setting:21604} 10. Overall Rehab/Functional Prognosis: {potential:3041437}  MD Signature ***

## 2023-03-24 ENCOUNTER — Encounter (HOSPITAL_COMMUNITY): Payer: Self-pay | Admitting: Physical Medicine and Rehabilitation

## 2023-03-24 ENCOUNTER — Inpatient Hospital Stay (HOSPITAL_COMMUNITY)
Admission: RE | Admit: 2023-03-24 | Discharge: 2023-04-09 | DRG: 945 | Disposition: A | Discharge status: Home Health | Payer: Medicare Other | Source: Other Acute Inpatient Hospital | Attending: Physical Medicine and Rehabilitation | Admitting: Physical Medicine and Rehabilitation

## 2023-03-24 ENCOUNTER — Other Ambulatory Visit: Payer: Self-pay | Admitting: Physical Medicine and Rehabilitation

## 2023-03-24 ENCOUNTER — Other Ambulatory Visit: Payer: Self-pay

## 2023-03-24 DIAGNOSIS — M7989 Other specified soft tissue disorders: Secondary | ICD-10-CM | POA: Diagnosis present

## 2023-03-24 DIAGNOSIS — G479 Sleep disorder, unspecified: Secondary | ICD-10-CM | POA: Diagnosis not present

## 2023-03-24 DIAGNOSIS — E785 Hyperlipidemia, unspecified: Secondary | ICD-10-CM | POA: Diagnosis present

## 2023-03-24 DIAGNOSIS — E11319 Type 2 diabetes mellitus with unspecified diabetic retinopathy without macular edema: Secondary | ICD-10-CM | POA: Diagnosis present

## 2023-03-24 DIAGNOSIS — I120 Hypertensive chronic kidney disease with stage 5 chronic kidney disease or end stage renal disease: Secondary | ICD-10-CM | POA: Diagnosis present

## 2023-03-24 DIAGNOSIS — I953 Hypotension of hemodialysis: Secondary | ICD-10-CM | POA: Diagnosis not present

## 2023-03-24 DIAGNOSIS — R5381 Other malaise: Principal | ICD-10-CM

## 2023-03-24 DIAGNOSIS — Z794 Long term (current) use of insulin: Secondary | ICD-10-CM

## 2023-03-24 DIAGNOSIS — Z86718 Personal history of other venous thrombosis and embolism: Secondary | ICD-10-CM

## 2023-03-24 DIAGNOSIS — J90 Pleural effusion, not elsewhere classified: Secondary | ICD-10-CM | POA: Diagnosis not present

## 2023-03-24 DIAGNOSIS — Z7901 Long term (current) use of anticoagulants: Secondary | ICD-10-CM

## 2023-03-24 DIAGNOSIS — Z86711 Personal history of pulmonary embolism: Secondary | ICD-10-CM

## 2023-03-24 DIAGNOSIS — T45515A Adverse effect of anticoagulants, initial encounter: Secondary | ICD-10-CM | POA: Diagnosis not present

## 2023-03-24 DIAGNOSIS — N2581 Secondary hyperparathyroidism of renal origin: Secondary | ICD-10-CM | POA: Diagnosis present

## 2023-03-24 DIAGNOSIS — I959 Hypotension, unspecified: Secondary | ICD-10-CM | POA: Diagnosis present

## 2023-03-24 DIAGNOSIS — Z992 Dependence on renal dialysis: Secondary | ICD-10-CM

## 2023-03-24 DIAGNOSIS — E559 Vitamin D deficiency, unspecified: Secondary | ICD-10-CM | POA: Diagnosis present

## 2023-03-24 DIAGNOSIS — D631 Anemia in chronic kidney disease: Secondary | ICD-10-CM | POA: Diagnosis present

## 2023-03-24 DIAGNOSIS — N186 End stage renal disease: Secondary | ICD-10-CM | POA: Diagnosis present

## 2023-03-24 DIAGNOSIS — L8990 Pressure ulcer of unspecified site, unspecified stage: Secondary | ICD-10-CM | POA: Diagnosis present

## 2023-03-24 DIAGNOSIS — R0682 Tachypnea, not elsewhere classified: Secondary | ICD-10-CM | POA: Diagnosis present

## 2023-03-24 DIAGNOSIS — Z833 Family history of diabetes mellitus: Secondary | ICD-10-CM

## 2023-03-24 DIAGNOSIS — Z952 Presence of prosthetic heart valve: Principal | ICD-10-CM

## 2023-03-24 DIAGNOSIS — L89322 Pressure ulcer of left buttock, stage 2: Secondary | ICD-10-CM | POA: Diagnosis present

## 2023-03-24 DIAGNOSIS — E8770 Fluid overload, unspecified: Secondary | ICD-10-CM | POA: Diagnosis present

## 2023-03-24 DIAGNOSIS — Z6828 Body mass index (BMI) 28.0-28.9, adult: Secondary | ICD-10-CM

## 2023-03-24 DIAGNOSIS — Z885 Allergy status to narcotic agent status: Secondary | ICD-10-CM

## 2023-03-24 DIAGNOSIS — D638 Anemia in other chronic diseases classified elsewhere: Secondary | ICD-10-CM | POA: Diagnosis present

## 2023-03-24 DIAGNOSIS — E11649 Type 2 diabetes mellitus with hypoglycemia without coma: Secondary | ICD-10-CM | POA: Diagnosis not present

## 2023-03-24 DIAGNOSIS — I2510 Atherosclerotic heart disease of native coronary artery without angina pectoris: Secondary | ICD-10-CM | POA: Diagnosis present

## 2023-03-24 DIAGNOSIS — Z79899 Other long term (current) drug therapy: Secondary | ICD-10-CM

## 2023-03-24 DIAGNOSIS — Z23 Encounter for immunization: Secondary | ICD-10-CM | POA: Diagnosis not present

## 2023-03-24 DIAGNOSIS — R34 Anuria and oliguria: Secondary | ICD-10-CM | POA: Diagnosis present

## 2023-03-24 DIAGNOSIS — I4891 Unspecified atrial fibrillation: Secondary | ICD-10-CM | POA: Diagnosis present

## 2023-03-24 DIAGNOSIS — F419 Anxiety disorder, unspecified: Secondary | ICD-10-CM | POA: Diagnosis present

## 2023-03-24 DIAGNOSIS — G4700 Insomnia, unspecified: Secondary | ICD-10-CM | POA: Diagnosis present

## 2023-03-24 DIAGNOSIS — D6832 Hemorrhagic disorder due to extrinsic circulating anticoagulants: Secondary | ICD-10-CM | POA: Diagnosis not present

## 2023-03-24 DIAGNOSIS — E8779 Other fluid overload: Secondary | ICD-10-CM | POA: Diagnosis not present

## 2023-03-24 DIAGNOSIS — R040 Epistaxis: Secondary | ICD-10-CM | POA: Diagnosis not present

## 2023-03-24 DIAGNOSIS — R600 Localized edema: Secondary | ICD-10-CM | POA: Diagnosis present

## 2023-03-24 DIAGNOSIS — I081 Rheumatic disorders of both mitral and tricuspid valves: Secondary | ICD-10-CM | POA: Diagnosis present

## 2023-03-24 DIAGNOSIS — J9811 Atelectasis: Secondary | ICD-10-CM | POA: Diagnosis not present

## 2023-03-24 DIAGNOSIS — E1122 Type 2 diabetes mellitus with diabetic chronic kidney disease: Secondary | ICD-10-CM | POA: Diagnosis present

## 2023-03-24 DIAGNOSIS — E663 Overweight: Secondary | ICD-10-CM | POA: Diagnosis present

## 2023-03-24 DIAGNOSIS — Z888 Allergy status to other drugs, medicaments and biological substances status: Secondary | ICD-10-CM

## 2023-03-24 DIAGNOSIS — Z9889 Other specified postprocedural states: Secondary | ICD-10-CM

## 2023-03-24 HISTORY — DX: Dependence on renal dialysis: N18.6

## 2023-03-24 HISTORY — DX: Bell's palsy: G51.0

## 2023-03-24 HISTORY — DX: Dependence on renal dialysis: Z99.2

## 2023-03-24 HISTORY — DX: Unspecified background retinopathy: H35.00

## 2023-03-24 HISTORY — DX: Personal history of other mental and behavioral disorders: Z86.59

## 2023-03-24 LAB — HEPARIN LEVEL (UNFRACTIONATED): Heparin Unfractionated: <0.1 IU/mL — ABNORMAL LOW (ref 0.30–0.70)

## 2023-03-24 LAB — HEPATITIS B SURFACE ANTIGEN: Hepatitis B Surface Ag: NONREACTIVE

## 2023-03-24 LAB — GLUCOSE, CAPILLARY
Glucose-Capillary: 198 mg/dL — ABNORMAL HIGH (ref 70–99)
Glucose-Capillary: 136 mg/dL — ABNORMAL HIGH (ref 70–99)

## 2023-03-24 LAB — HEMOGLOBIN A1C
Hgb A1c MFr Bld: 5 % (ref 4.8–5.6)
Mean Plasma Glucose: 96.8 mg/dL

## 2023-03-24 LAB — PROTIME-INR
INR: 2.2 — ABNORMAL HIGH (ref 0.8–1.2)
Prothrombin Time: 24.6 seconds — ABNORMAL HIGH (ref 11.4–15.2)

## 2023-03-24 MED ORDER — HEPARIN (PORCINE) 25000 UT/250ML-% IV SOLN
1300.0000 [IU]/h | INTRAVENOUS | Status: DC
  Administered 2023-03-24: 1100 [IU]/h via INTRAVENOUS
  Filled 2023-03-24 (×2): qty 250

## 2023-03-24 MED ORDER — NEPRO/CARBSTEADY PO LIQD
237.0000 mL | Freq: Three times a day (TID) | ORAL | Status: DC
  Administered 2023-03-25 – 2023-03-26 (×4): 237 mL via ORAL

## 2023-03-24 MED ORDER — SIMETHICONE 80 MG PO CHEW
80.0000 mg | CHEWABLE_TABLET | Freq: Four times a day (QID) | ORAL | Status: DC
  Administered 2023-03-24 – 2023-04-07 (×51): 80 mg via ORAL
  Filled 2023-03-24 (×58): qty 1

## 2023-03-24 MED ORDER — MILK AND MOLASSES ENEMA: 1.0000 | Freq: Every day | RECTAL | Status: DC | PRN

## 2023-03-24 MED ORDER — PNEUMOCOCCAL VAC POLYVALENT 25 MCG/0.5ML IJ INJ
0.5000 mL | INJECTION | INTRAMUSCULAR | Status: AC
  Administered 2023-03-25: 0.5 mL via INTRAMUSCULAR
  Filled 2023-03-24: qty 0.5

## 2023-03-24 MED ORDER — SORBITOL 70 % SOLN
60.0000 mL | Freq: Every day | Status: DC | PRN
  Filled 2023-03-24: qty 60

## 2023-03-24 MED ORDER — SALINE SPRAY 0.65 % NA SOLN
1.0000 | NASAL | Status: DC | PRN
  Administered 2023-03-28: 1 via NASAL
  Filled 2023-03-24 (×2): qty 44

## 2023-03-24 MED ORDER — DROXIDOPA 300 MG PO CAPS
500.0000 mg | ORAL_CAPSULE | Freq: Three times a day (TID) | ORAL | Status: DC
  Administered 2023-03-24 – 2023-04-07 (×41): 500 mg via ORAL
  Filled 2023-03-24 (×43): qty 2

## 2023-03-24 MED ORDER — PANTOPRAZOLE SODIUM 40 MG PO TBEC: 40.0000 mg | DELAYED_RELEASE_TABLET | Freq: Every day | ORAL | Status: DC

## 2023-03-24 MED ORDER — PANTOPRAZOLE SODIUM 40 MG PO TBEC
40.0000 mg | DELAYED_RELEASE_TABLET | Freq: Two times a day (BID) | ORAL | Status: DC
  Administered 2023-03-24 – 2023-03-27 (×7): 40 mg via ORAL
  Filled 2023-03-24 (×7): qty 1

## 2023-03-24 MED ORDER — INSULIN ASPART 100 UNIT/ML IJ SOLN
0.0000 [IU] | Freq: Three times a day (TID) | INTRAMUSCULAR | Status: DC
  Administered 2023-03-24 – 2023-03-25 (×2): 1 [IU] via SUBCUTANEOUS
  Administered 2023-03-26: 4 [IU] via SUBCUTANEOUS
  Administered 2023-03-26: 1 [IU] via SUBCUTANEOUS
  Administered 2023-03-27: 2 [IU] via SUBCUTANEOUS
  Administered 2023-03-28: 1 [IU] via SUBCUTANEOUS
  Administered 2023-03-29: 3 [IU] via SUBCUTANEOUS
  Administered 2023-03-29 – 2023-03-30 (×2): 2 [IU] via SUBCUTANEOUS
  Administered 2023-03-31: 1 [IU] via SUBCUTANEOUS
  Administered 2023-03-31: 5 [IU] via SUBCUTANEOUS

## 2023-03-24 MED ORDER — ASPIRIN 81 MG PO TBEC
81.0000 mg | DELAYED_RELEASE_TABLET | Freq: Every day | ORAL | Status: DC
  Administered 2023-03-25 – 2023-04-09 (×16): 81 mg via ORAL
  Filled 2023-03-24 (×16): qty 1

## 2023-03-24 MED ORDER — CINACALCET HCL 30 MG PO TABS
60.0000 mg | ORAL_TABLET | ORAL | Status: DC
  Administered 2023-03-25 – 2023-04-08 (×7): 60 mg via ORAL
  Filled 2023-03-24 (×7): qty 2

## 2023-03-24 MED ORDER — RENA-VITE PO TABS
1.0000 | ORAL_TABLET | Freq: Every day | ORAL | Status: DC
  Administered 2023-03-24 – 2023-04-08 (×16): 1 via ORAL
  Filled 2023-03-24 (×16): qty 1

## 2023-03-24 MED ORDER — INSULIN ASPART 100 UNIT/ML IJ SOLN
0.0000 [IU] | Freq: Every day | INTRAMUSCULAR | Status: DC
  Administered 2023-03-25 – 2023-03-28 (×2): 2 [IU] via SUBCUTANEOUS
  Administered 2023-03-28: 5 [IU] via SUBCUTANEOUS

## 2023-03-24 MED ORDER — INSULIN ASPART 100 UNIT/ML IJ SOLN
12.0000 [IU] | Freq: Every day | INTRAMUSCULAR | Status: DC
  Administered 2023-03-28: 12 [IU] via SUBCUTANEOUS

## 2023-03-24 MED ORDER — ATOMOXETINE HCL 10 MG PO CAPS
10.0000 mg | ORAL_CAPSULE | Freq: Every day | ORAL | Status: DC
  Filled 2023-03-24: qty 1

## 2023-03-24 MED ORDER — WARFARIN SODIUM 2.5 MG PO TABS
3.7500 mg | ORAL_TABLET | Freq: Once | ORAL | Status: AC
  Administered 2023-03-24: 3.75 mg via ORAL
  Filled 2023-03-24: qty 1

## 2023-03-24 MED ORDER — AMIODARONE HCL 200 MG PO TABS
200.0000 mg | ORAL_TABLET | Freq: Every day | ORAL | Status: DC
  Administered 2023-03-25 – 2023-04-09 (×16): 200 mg via ORAL
  Filled 2023-03-24 (×16): qty 1

## 2023-03-24 MED ORDER — MIDODRINE HCL 5 MG PO TABS
20.0000 mg | ORAL_TABLET | Freq: Three times a day (TID) | ORAL | Status: DC
  Administered 2023-03-24 – 2023-04-09 (×46): 20 mg via ORAL
  Filled 2023-03-24 (×48): qty 4

## 2023-03-24 MED ORDER — CHLORHEXIDINE GLUCONATE CLOTH 2 % EX PADS
6.0000 | MEDICATED_PAD | Freq: Every day | CUTANEOUS | Status: DC
  Administered 2023-03-25: 6 via TOPICAL

## 2023-03-24 MED ORDER — INSULIN ASPART 100 UNIT/ML IJ SOLN
15.0000 [IU] | Freq: Two times a day (BID) | INTRAMUSCULAR | Status: DC
  Administered 2023-03-24 – 2023-03-28 (×4): 15 [IU] via SUBCUTANEOUS

## 2023-03-24 MED ORDER — ATOMOXETINE HCL 18 MG PO CAPS
18.0000 mg | ORAL_CAPSULE | Freq: Every day | ORAL | Status: DC
  Administered 2023-03-25 – 2023-04-04 (×11): 18 mg via ORAL
  Filled 2023-03-24 (×15): qty 1

## 2023-03-24 MED ORDER — JUVEN PO PACK
1.0000 | PACK | Freq: Two times a day (BID) | ORAL | Status: DC
  Administered 2023-03-24 – 2023-03-26 (×3): 1 via ORAL
  Filled 2023-03-24 (×8): qty 1

## 2023-03-24 MED ORDER — WARFARIN - PHARMACIST DOSING INPATIENT: Freq: Every day | Status: DC

## 2023-03-24 MED ORDER — HEPARIN (PORCINE) 25000 UT/250ML-% IV SOLN
1200.0000 [IU]/h | INTRAVENOUS | Status: DC
  Filled 2023-03-24: qty 250

## 2023-03-24 NOTE — H&P (Signed)
Physical Medicine and Rehabilitation Admission H&P     CC: Debility     HPI:  Colleen [name]. Colleen [name] is a 62 year old R handed female with history of T2DM with retinopathy/nephropathy,  ESRD- HD MWF, h/o DVT 2016, PE, CN VIII palsy, severe MVR with moderate to severe stenosis and TVR who was admitted to [facility] on 02/19/23 for MVR with valvuloplasty TV with ring insertion and removal of LV mass by Dr Felipa Furnace. Hospital course significant for post op hypotension which resolved but she developed significant orthostatic changes  treated with stress dose steriods 3/25-3/27 and Midodrine, Droxidopa and Straterra added for BP support. Angioedema managed with decadron X 24 hours and leucocytosis treated with Cefepime 3/24-3/31. She developed diarrhea and stools negative for C diff and being managed with imodium prn. She developed complete HB and Aveira leadless RV PPM (VVI 90) placed on 03/21. She was loaded with amiodarone for A fib and converted to NSR.    She has had poor po intake as well as issues with delirium/agitation. Narcotics d/c and pain managed with tylenol and lidocaine patches. She required enteral feeds thru 03/28 and has been advanced to regular diet with thin liquids and multiple supplements. Endocrine consulted to help manage labile BS and now on regular insulin TID. Neurology consulted 03/21 due to lethargy with confusion and reports of apraxia which was felt to be due to delirium, orthostasis and medication related. Imaging deferred as patient was improving on follow up exam.    Pt reports her arms and legs are swollen- occurred since surgery/MVR.  Also intermittently SOB, esp if lays down- 2-3L O2 by [place] is also new.   Also c/o anxiety- cannot tolerate door or curtain closed.    LBM this AM- denies constipation- is Anuric- doesn't form ANY urine anymore.  Denies pain- "none since surgery' per pt.      Review of Systems  Constitutional:  Negative for chills and fever.  HENT:   Negative for hearing loss and tinnitus.   Eyes:  Positive for blurred vision.  Respiratory:  Positive for cough and shortness of breath. Negative for hemoptysis and sputum production.   Cardiovascular:  Positive for leg swelling. Negative for chest pain.  Gastrointestinal:  Negative for constipation.  Genitourinary:        Anuric  Musculoskeletal: Negative.   Neurological:  Positive for weakness. Negative for dizziness and headaches.  Psychiatric/Behavioral:  The patient is nervous/anxious.   All other systems reviewed and are negative.         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 face since birth   Retinopathy      Was going blind and had surgery with improvement           Past Surgical History:  Procedure Laterality Date   A/V FISTULAGRAM Left 07/29/2019    Procedure: A/V FISTULAGRAM;  Surgeon: Sherren Kerns, MD;  [facility];  Service: Cardiovascular;  Laterality: Left;   ARTERY EXPLORATION Left 06/14/2019    Procedure: Artery Exploration Left Upper Arm;  Surgeon: Sherren Kerns, MD;  Location: [facility] OR;  Service: Vascular;  Laterality: Left;   AV FISTULA PLACEMENT Left 12/31/2018    Procedure: ARTERIOVENOUS (AV) FISTULA CREATION VERSUS INSERTION OF ARTERIOVENOUS GRAFT LEFT ARM;  Surgeon: Sherren Kerns, MD;  [facility]: [facility] OR;  Service: Vascular;  Laterality: Left;   BASCILIC VEIN TRANSPOSITION Left 06/14/2019    Procedure: SECOND STAGE BASILIC VEIN TRANSPOSITION LEFT ARM;  Surgeon: Sherren Kerns, MD;  Location: [facility] OR;  Service: Vascular;  Laterality: Left;   BIOPSY   03/05/2020     Procedure: BIOPSY;  Surgeon: Shellia Cleverly, DO;  Location: [facility];  Service: Gastroenterology;;   COLONOSCOPY WITH PROPOFOL N/A 03/05/2020    Procedure: COLONOSCOPY WITH PROPOFOL;  Surgeon: Shellia Cleverly, DO;  Location: [facility];  Service: Gastroenterology;  Laterality: N/A;   EYE SURGERY        9 total eye surgery including laser and cararact surgery   HEMOSTASIS CLIP PLACEMENT   03/05/2020    Procedure: HEMOSTASIS CLIP PLACEMENT;  Surgeon: Shellia Cleverly, DO;  [facility]: [facility];  Service: Gastroenterology;;   INSERTION OF DIALYSIS CATHETER N/A 12/31/2018    Procedure: INSERTION OF TUNNELED  DIALYSIS CATHETER;  Surgeon: Sherren Kerns, MD;  Location: [facility] OR;  Service: Vascular;  Laterality: N/A;   PERIPHERAL VASCULAR BALLOON ANGIOPLASTY Left 07/29/2019    Procedure: PERIPHERAL VASCULAR BALLOON ANGIOPLASTY;  Surgeon: Sherren Kerns, MD;  [facility];  Service: Cardiovascular;  Laterality: Left;  ARM FISTULA   POLYPECTOMY   03/05/2020    Procedure: POLYPECTOMY;  Surgeon: Shellia Cleverly, DO;  [facility];  Service: Gastroenterology;;   TEE WITHOUT CARDIOVERSION N/A 12/12/2020    Procedure: TRANSESOPHAGEAL ECHOCARDIOGRAM (TEE);  Surgeon: Wendall Stade, MD;  Location: [facility] ENDOSCOPY;  Service: Cardiovascular;  Laterality: N/A;           Family History  Problem Relation Age of Onset   Diabetes Father     Diabetes Maternal Aunt     CAD Neg Hx     Colon cancer Neg Hx     Esophageal cancer Neg Hx     Cancer Neg Hx        Social History:  reports that she has never smoked. She has never used smokeless tobacco. She reports that she does not drink alcohol and does not use drugs.          Allergies  Allergen Reactions   Farxiga [Dapagliflozin] Other (See Comments)      Cannot take due to kidneys   Hydrocodone Other (See Comments)      "Increase Glucose level," per pt   Kombiglyze Xr [Saxagliptin-Metformin Er] Other (See Comments)       Cannot take due to kidneys            Medications Prior to Admission  Medication Sig Dispense Refill   insulin aspart protamine - aspart (NOVOLOG MIX 70/30 FLEXPEN) (70-30) 100 UNIT/ML FlexPen Inject 5-10 Units into the skin with breakfast, with lunch, and with evening meal. (Patient not taking: Reported on 03/24/2023)       Insulin Glargine (BASAGLAR KWIKPEN) 100 UNIT/ML Inject 5 Units into the skin at bedtime. (Patient not taking: Reported on 03/24/2023)       lanthanum (FOSRENOL) 1000 MG chewable tablet Chew 1,000 mg by mouth 3 (  three) times daily with meals. (Patient not taking: Reported on 03/24/2023)              Home:     Functional History:     Functional Status:  Mobility: Min assist for bed mobility Mod assist for transfers Mod assist with swedish walker to transfer to chair.    ADL: Mod assist for UB care Max assist for LB care   Cognition:     [facility] work up pasted from acute chart:   CXR-03/16/2023 10:01 AM EDT  Procedure: Frontal and lateral views of the chest.  Indication: Tube Position, Line Position, Lung Aeration, Z95.2 Presence of prosthetic heart valve  Comparison: 03/15/2023.  Findings and impression: 1. Stable enlargement of cardiac and mediastinal contours. 2. Stable diffuse pulmonary opacities most likely representing edema. 3. Stable circumferential right pleural opacity, possibly loculated fluid or pleural thickening. 4. Left internal jugular venous catheter and feeding tube remaining in place.  Electronically Signed by:  Dierdre Forth, MD, [facility] Electronically Signed on:  03/16/2023 10:01 AM     Basic Metabolic panel: Recent Data from [facility] Related to Basic Metabolic Panel (BMP) Component 03/24/23 03/24/23 03/23/23 03/22/23 03/21/23 03/20/23  Sodium 132 Low  133 Low  131 Low  134 Low  132 Low  134 Low   Potassium --  --  3.8 4.0  4.1  3.9  Chloride 95 Low  96 Low  95 Low  95 Low  95 Low  95 Low    Carbon Dioxide (CO2) Urea Nitrogen (BUN) 29 High  29 High  46 High  34 High  26 High  28 High   Creatinine 4.6 High  4.4 High  5.4 High  4.3 High  3.7 High  3.5 High   Glucose 126  129  80  199 High   218 High   243 High    Calcium 7.8 Low  7.8 Low  8.2 Low  8.1 Low  8.0 Low  8.3 Low   Anion Gap High   BUN/CREA Ratio Glomerular Filtration Rate (eGFR) Complete blood count:  Component 03/24/23 03/23/23 03/22/23 03/21/23 03/20/23 03/19/23  WBC (White Blood Cell Count) 8.8 11.6 High  10.8 High  11.1 High  10.5 High  10.4 High   Hemoglobin 9.1 Low  9.3 Low  9.5 Low  9.6 Low  9.2 Low  9.1 Low   Hematocrit 29.9 Low  30.7 Low  31.9 Low  32.5 Low  31.6 Low  29.4 Low   Platelets 233 210 223 234 202 213  MCV (Mean Corpuscular Volume) 98 99 High  101 High  102 High  103 High  99 High   MCH (Mean Corpuscular Hemoglobin) 29.9 30.1 30.2 30.1 30.0 30.5  MCHC (Mean Corpuscular Hemoglobin Concentration) 30.4 Low  30.3 Low  29.8 Low  29.5 Low  29.1 Low  31.0  RBC (Red Blood Cell Count) 3.04 Low  3.09 Low  3.15 Low  3.19 Low  3.07 Low  2.98 Low   RDW-CV (Red Cell Distribution Width) 18.9 High  19.1 High  20.0 High  20.9 High  21.0 High  21.2 High   NRBC (Nucleated Red Blood Cell Count) 0.03 High  0.05 High  0.11 High  0.17 High  0.20 High  0.30  High   NRBC % (Nucleated Red Blood Cell %) 0.3 0.4 1.0 1.5 1.9 2.9  MPV (Mean Platelet Volume) 11.8 High  11.9 High  11.7 11.7 11.3 12.5 High   View all related data        Physical Exam: Blood pressure (!) 99/54, pulse 88, temperature 97.6 F (36.4 C), temperature source Axillary, resp. rate 16, last menstrual period 12/08/2013, SpO2 97 %. Physical Exam Vitals and nursing note reviewed. Exam conducted with a chaperone present.  Constitutional:      Appearance: Normal appearance.     Comments: 3L Oxygen per [place] sats 95%. Sitting up in bed in NAD. Mildy anxious appearing.  Awake,  alert, appropriate, NAD  HENT:     Head: Normocephalic.     Comments: R facial swelling and R facial droop-  Tongue midline    Right Ear: External ear normal.     Left Ear: External ear normal.     Nose: Nose normal. No congestion.     Mouth/Throat:     Mouth: Mucous membranes are dry.     Pharynx: Oropharynx is clear. No oropharyngeal exudate.  Eyes:     General:        Right eye: No discharge.        Left eye: No discharge.     Extraocular Movements: Extraocular movements intact.  Cardiovascular:     Rate and Rhythm: Tachycardia present. Rhythm irregular.     Heart sounds: Normal heart sounds.     No gallop.     Comments: Sternal incision and upper abd incision with sutures in place Pulmonary:     Comments: On 3L O2 by [place]- RR in low 20s Decreased breath sounds- mainly at bases B/L  Abdominal:     General: Bowel sounds are normal. There is no distension.     Palpations: Abdomen is soft.     Tenderness: There is no abdominal tenderness.  Musculoskeletal:     Cervical back: Neck supple. No tenderness.     Comments: Ue's 5-/5 B/L HF/KE 3+/5 B/L; DF/PF 4+/5 B/L  Skin:    General: Skin is warm and dry.     Comments: 2-3+ edema BLE- and some wooding in hands and arms- from reduced swelling L buttock pressure ulcer- Stage II- ~1.5 x 0.5 cm- pink- slightly open Incision closed on R chest and L chest from prior HD catheters Fistula L upper arm- (+) thrill  Neurological:     Mental Status: She is alert.     Comments: Right facial weakness without dysarthria. Speech slow but clear. Oriented X 4. She was able to follow simple motor commands. Anxious at times--does not like to be closed in. She was only able to see one number on the clock.   Intact to light touch in all 4 extremities per pt  Fully oriented  Psychiatric:     Comments: Somewhat anxious        Lab Results Last 48 Hours  No results found for this or any previous visit (from the past 48 hour(s)).   Imaging Results  (Last 48 hours)  No results found.         Blood pressure (!) 99/54, pulse 88, temperature 97.6 F (36.4 C), temperature source Axillary, resp. rate 16, last menstrual period 12/08/2013, SpO2 97 %.   Medical Problem List and Plan: 1. Functional deficits secondary to debility after MV repair with sternal precautions             -patient may  shower if cover incisions- HD catheters in chest are out- also has sternal incisions that need to be covered             -ELOS/Goals: 10-14 days supervision to min A 2.  Antithrombotics: -DVT/anticoagulation:  Pharmaceutical: Coumadin and Lovenox             -antiplatelet therapy: ASA 3. Pain Management:  Tylenol prn. Lidocaine patches prn. Denies pain 4. Mood/Behavior/Sleep: LCSW to follow for evaluation and support.              -antipsychotic agents: N/A 5. Neuropsych/cognition: This patient may not be fully capable of making decisions on her own behalf.             --delirium precautions 6. Skin/Wound Care: Routine pressure relief measures with frequent pressure relief measures for sacral decub.             --Interdry to skin folds.              --Add vitamin C and Zinc to promote wound healing 7. Fluids/Electrolytes/Nutrition: Strict I/O. Labs with HD.  --Will keep diet as regular to help with food choices             --Juven and nephro added. 8. MVR/TVR by Dr. Felipa Furnace [id number]): Continue Sternal precautions.              --PPM for CHB             --coumadin with INR goal 2.5-3.5 9.  Hypotension: Monitor for symptoms with increase in activity             --on midodrine, Staterra and Droxidopa.  10.  ESRD: Schedule HD at the end of the day. Renal diet with 1200 cc FR.  11. T2DM: Monitor BS 5 X day with novolog 12 units w/breakfast and 15 units BID 12. A fib: Monitor HR TID. No BB due to hypotension.             --on coumadin. 13. Anemia of chronic disease: Monitor H/H with HD labs/             --transfuse prn Hgb <7.0 or if  symptomatic.  14. Anxiety- pt cannot tolerate door/curtain closed 15. New O2 requirement with mild tachypnea - also has Fluid in LE's- likely fluid related, however will need to wean O2 as tolerated and monitor Vitals, to make sure these Sx's improve         Jacquelynn Cree, PA-C 03/24/2023     I have personally performed a face to face diagnostic evaluation of this patient and formulated the key components of the plan.  Additionally, I have personally reviewed laboratory data, imaging studies, as well as relevant notes and concur with the physician assistant's documentation above.   The patient's status has not changed from the original H&P.  Any changes in documentation from the acute care chart have been noted above.

## 2023-03-24 NOTE — Consult Note (Signed)
Renal Service Consult Note [facility]  Colleen [name] 03/24/2023 Colleen Krabbe, MD Requesting Physician: Dr. Carlis Abbott, CIR  Reason for Consult: ESRD pt admitted to CIR post heart surgery HPI: The patient is a 62 y.o. year-old w/ PMH as below who was undergoing renal transplant w/u and echo showed severe MR, mod MS and severe TR w/ mod RV dysfunction. LHC showed min CAD, mod pHTN, normal filling pressures and severe MR w/ mod-severe MS. Pt was admitted for surgical repair to [facility] on 3/13 and underwent surgery on 3/14 including MV replacement, TV ring, AV decalcification w/ removal of LV mass. Then developed CHB and afib post-op and on 3/21 underwent leadless PPM placement. Hypotension rx'd w/ midodrine and Strattera and droxidopa as needed. A/C w/ coumadin and asa. Rec'd TF"s which was dc'd on 3/28. Having pain mgmnt issues using lidocaine patch and prn tylenol. Pt was dc'd and admitted to CIR for rehab. We are asked to see for ESRD.    Pt seen in room. Started HD in 2020. Goes to [employer] in [place]. No recent AVF issues.   ROS - denies CP, no joint pain, no HA, no blurry vision, no rash, no diarrhea, no nausea/ vomiting  Past Medical History  Past Medical History:  Diagnosis Date   Anemia    Chronic kidney disease    ESRD [place] -   Diabetes (HCC)    type II   DVT (deep venous thrombosis) (HCC)    E-coli UTI    Gout 06/05/2018   History of blood transfusion    HLD (hyperlipidemia) 06/05/2018   Hypertension    Kidney failure    PE (pulmonary embolism)    Pulled muscle    pt has right sided facial droop from pulled muscle in face since birth   Past Surgical History  Past Surgical History:  Procedure Laterality Date   A/V FISTULAGRAM Left 07/29/2019   Procedure: A/V FISTULAGRAM;  Surgeon: Sherren Kerns, MD;  [facility];  Service: Cardiovascular;  Laterality: Left;   ARTERY EXPLORATION Left 06/14/2019   Procedure: Artery  Exploration Left Upper Arm;  Surgeon: Sherren Kerns, MD;  Location: [facility] OR;  Service: Vascular;  Laterality: Left;   AV FISTULA PLACEMENT Left 12/31/2018   Procedure: ARTERIOVENOUS (AV) FISTULA CREATION VERSUS INSERTION OF ARTERIOVENOUS GRAFT LEFT ARM;  Surgeon: Sherren Kerns, MD;  [facility];  Service: Vascular;  Laterality: Left;   BASCILIC VEIN TRANSPOSITION Left 06/14/2019   Procedure: SECOND STAGE BASILIC VEIN TRANSPOSITION LEFT ARM;  Surgeon: Sherren Kerns, MD;  Location: [facility] OR;  Service: Vascular;  Laterality: Left;   BIOPSY  03/05/2020   Procedure: BIOPSY;  Surgeon: Shellia Cleverly, DO;  [facility];  Service: Gastroenterology;;   COLONOSCOPY WITH PROPOFOL N/A 03/05/2020   Procedure: COLONOSCOPY WITH PROPOFOL;  Surgeon: Shellia Cleverly, DO;  Location: [facility];  Service: Gastroenterology;  Laterality: N/A;   EYE SURGERY     9 total eye surgery including laser and cararact surgery   HEMOSTASIS CLIP PLACEMENT  03/05/2020   Procedure: HEMOSTASIS CLIP PLACEMENT;  Surgeon: Shellia Cleverly, DO;  [facility]: [facility];  Service: Gastroenterology;;   INSERTION OF DIALYSIS CATHETER N/A 12/31/2018   Procedure: INSERTION OF TUNNELED  DIALYSIS CATHETER;  Surgeon: Sherren Kerns, MD;  Location: [facility] OR;  Service: Vascular;  Laterality: N/A;   PERIPHERAL VASCULAR BALLOON ANGIOPLASTY Left 07/29/2019   Procedure: PERIPHERAL VASCULAR BALLOON ANGIOPLASTY;  Surgeon: Fabienne Bruns  E, MD;  [facility];  Service: Cardiovascular;  Laterality: Left;  ARM FISTULA   POLYPECTOMY  03/05/2020   Procedure: POLYPECTOMY;  Surgeon: Shellia Cleverly, DO;  [facility];  Service: Gastroenterology;;   TEE WITHOUT CARDIOVERSION N/A 12/12/2020   Procedure: TRANSESOPHAGEAL ECHOCARDIOGRAM (TEE);  Surgeon: Wendall Stade, MD;  Location: [facility] ENDOSCOPY;  Service: Cardiovascular;  Laterality: N/A;   Family History  Family History  Problem Relation Age of Onset   CAD  Neg Hx    Colon cancer Neg Hx    Esophageal cancer Neg Hx    Cancer Neg Hx    Social History  reports that she has never smoked. She has never used smokeless tobacco. She reports that she does not drink alcohol and does not use drugs. Allergies  Allergies  Allergen Reactions   Dapagliflozin Other (See Comments)    Cannot take due to kidneys   Hydrocodone Other (See Comments)    "Increase Glucose level," per pt   Kombiglyze Xr [Saxagliptin-Metformin Er] Other (See Comments)    Cannot take due to kidneys   Home medications Prior to Admission medications   Medication Sig Start Date End Date Taking? Authorizing Provider  AURYXIA 1 GM 210 MG(Fe) tablet Take 2 tablets by mouth 2 (two) times daily with a meal. 05/02/21   [provider]  HUMALOG KWIKPEN 100 UNIT/ML KwikPen Inject 1-3 Units into the skin See admin instructions. Inject 1-3 units into the skin once a day, PER SLIDING SCALE 12/27/19   [provider]  Insulin Glargine (BASAGLAR KWIKPEN) 100 UNIT/ML Inject 5 Units into the skin at bedtime. 12/12/20   Zannie Cove, MD  lanthanum (FOSRENOL) 1000 MG chewable tablet Chew 1,000 mg by mouth 3 (three) times daily with meals.    [provider]  ondansetron (ZOFRAN) 4 MG tablet Take 1 tablet (4 mg total) by mouth every 6 (six) hours as needed for nausea or vomiting. 06/28/21   Cirigliano, Vito V, DO     Vitals:   03/24/23 1444  BP: (!) 99/54  Resp: 16  Temp: 97.6 F (36.4 C)  SpO2: 97%   Exam Gen alert, no distress No rash, cyanosis or gangrene Sclera anicteric, throat clear  No jvd or bruits Chest clear bilat to bases, no rales/ wheezing RRR no MRG Abd soft ntnd no mass or ascites +bs GU defer MS no joint effusions or deformity Ext diffuse 2-3+ bilat pretib and hip edema Neuro is alert, Ox 3 , nf    LUA AVF+bruit    Home meds include - auryxia 420 mg ac tid, humalog kwikpen, insulin glargine, fosrenol 1 gm ac tid, prns      OP HD: MWF  SW 3h  400/1.5  69.5kg   2/2 bath  Heparin 3000  AVF LUA - last OP HD 3/13, post wt 69.5kg - sensipar 60mg  po tiw - hectorol 3 mcg IV tiw - venofer 50 mg IV weekly     Assessment/ Plan: Severe valve disease w/ pHTN/ RV dysfunction - sp MVR, sp TV ring and sp AoV decalcification w/ removal of LV mass at [facility] on 02/18/23.  Hypotension - post op, has been rx'd w/ midodrine, droxidopa and Straterra.  CHB - sp leadless PPM placement on 3/21 ESRD - on HD MWF local in [place]. Last HD 4/15 at [facility]. Next HD tomorrow.  Vol excess - has sig bilat LE edema, will be difficult to address if BP's remain low.  Anemia esrd - labs  are pending MBD ckd - labs are pending DM2 - on insulin      Vinson Moselle  MD [facility] 03/24/2023, 2:47 PM

## 2023-03-24 NOTE — H&P (Signed)
Physical Medicine and Rehabilitation Admission H&P    CC: Debility   HPI:  Colleen [name]. [name] is a 62 year old R handed female with history of T2DM with retinopathy/nephropathy,  ESRD- HD MWF, h/o DVT 2016, PE, CN VIII palsy, severe MVR with moderate to severe stenosis and TVR who was admitted to [facility] on 02/19/23 for MVR with valvuloplasty TV with ring insertion and removal of LV mass by Dr Felipa Furnace. Hospital course significant for post op hypotension which resolved but she developed significant orthostatic changes  treated with stress dose steriods 3/25-3/27 and Midodrine, Droxidopa and Straterra added for BP support. Angioedema managed with decadron X 24 hours and leucocytosis treated with Cefepime 3/24-3/31. She developed diarrhea and stools negative for C diff and being managed with imodium prn. She developed complete HB and Aveira leadless RV PPM (VVI 90) placed on 03/21. She was loaded with amiodarone for A fib and converted to NSR.   She has had poor po intake as well as issues with delirium/agitation. Narcotics d/c and pain managed with tylenol and lidocaine patches. She required enteral feeds thru 03/28 and has been advanced to regular diet with thin liquids and multiple supplements. Endocrine consulted to help manage labile BS and now on regular insulin TID. Neurology consulted 03/21 due to lethargy with confusion and reports of apraxia which was felt to be due to delirium, orthostasis and medication related. Imaging deferred as patient was improving on follow up exam.   Pt reports her arms and legs are swollen- occurred since surgery/MVR.  Also intermittently SOB, esp if lays down- 2-3L O2 by [place] is also new.   Also c/o anxiety- cannot tolerate door or curtain closed.   LBM this AM- denies constipation- is Anuric- doesn't form ANY urine anymore.  Denies pain- "none since surgery' per pt.    Review of Systems  Constitutional:  Negative for chills and fever.  HENT:  Negative for  hearing loss and tinnitus.   Eyes:  Positive for blurred vision.  Respiratory:  Positive for cough and shortness of breath. Negative for hemoptysis and sputum production.   Cardiovascular:  Positive for leg swelling. Negative for chest pain.  Gastrointestinal:  Negative for constipation.  Genitourinary:        Anuric  Musculoskeletal: Negative.   Neurological:  Positive for weakness. Negative for dizziness and headaches.  Psychiatric/Behavioral:  The patient is nervous/anxious.   All other systems reviewed and are negative.   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    Gout 06/05/2018   History of blood transfusion    HLD (hyperlipidemia) 06/05/2018   Hypertension    PE (pulmonary embolism)    Pulled muscle    pt has right sided facial droop from pulled muscle in face since birth   Retinopathy    Was going blind and had surgery with improvement    Past Surgical History:  Procedure Laterality Date   A/V FISTULAGRAM Left 07/29/2019   Procedure: A/V FISTULAGRAM;  Surgeon: Sherren Kerns, MD;  Location: [facility] INVASIVE CV [facility];  Service: Cardiovascular;  Laterality: Left;   ARTERY EXPLORATION Left 06/14/2019   Procedure: Artery Exploration Left Upper Arm;  Surgeon: Sherren Kerns, MD;  Location: [facility] OR;  Service: Vascular;  Laterality:  Left;   AV FISTULA PLACEMENT Left 12/31/2018   Procedure: ARTERIOVENOUS (AV) FISTULA CREATION VERSUS INSERTION OF ARTERIOVENOUS GRAFT LEFT ARM;  Surgeon: Sherren Kerns, MD;  Location: [facility] OR;  Service: Vascular;  Laterality: Left;   BASCILIC VEIN TRANSPOSITION Left 06/14/2019   Procedure: SECOND STAGE BASILIC VEIN TRANSPOSITION LEFT ARM;  Surgeon: Sherren Kerns, MD;  Location[facility] OR;  Service: Vascular;  Laterality: Left;   BIOPSY  03/05/2020   Procedure: BIOPSY;  Surgeon: Shellia Cleverly,  DO;  Location: [facility] ENDOSCOPY;  Service: Gastroenterology;;   COLONOSCOPY WITH PROPOFOL N/A 03/05/2020   Procedure: COLONOSCOPY WITH PROPOFOL;  Surgeon: Shellia Cleverly, DO;  Location: [facility] ENDOS[facility];  Service: Gastroenterology;  Laterality: N/A;   EYE SURGERY     9 total eye surgery including laser and cararact surgery   HEMOSTASIS CLIP PLACEMENT  03/05/2020   Procedure: HEMOSTASIS CLIP PLACEMENT;  Surgeon: Shellia Cleverly, DO;  Location: [facility] ENDOS[facility];  Service: Gastroenterology;;   INSERTION OF DIALYSIS CATHETER N/A 12/31/2018   Procedure: INSERTION OF TUNNELED  DIALYSIS CATHETER;  Surgeon: Sherren Kerns, MD;  Location: [facility] OR;  Service: Vascular;  Laterality: N/A;   PERIPHERAL VASCULAR BALLOON ANGIOPLASTY Left 07/29/2019   Procedure: PERIPHERAL VASCULAR BALLOON ANGIOPLASTY;  Surgeon: Sherren Kerns, MD;  Location: [facility];  Service: Cardiovascular;  Laterality: Left;  ARM FISTULA   POLYPECTOMY  03/05/2020   Procedure: POLYPECTOMY;  Surgeon: Shellia Cleverly, DO;  Location: [facility]OS[facility];  Service: Gastroenterology;;   TEE WITHOUT CARDIOVERSION N/A 12/12/2020   Procedure: TRANSESOPHAGEAL ECHOCARDIOGRAM (TEE);  Surgeon: Wendall Stade, MD;  Location: [facility] ENDOSCOPY;  Service: Cardiovascular;  Laterality: N/A;    Family History  Problem Relation Age of Onset   Diabetes Father    Diabetes Maternal Aunt    CAD Neg Hx    Colon cancer Neg Hx    Esophageal cancer Neg Hx    Cancer Neg Hx     Social History:  reports that she has never smoked. She has never used smokeless tobacco. She reports that she does not drink alcohol and does not use drugs.   Allergies  Allergen Reactions   Farxiga [Dapagliflozin] Other (See Comments)    Cannot take due to kidneys   Hydrocodone Other (See Comments)    "Increase Glucose level," per pt   Kombiglyze Xr [Saxagliptin-Metformin Er] Other (See Comments)    Cannot take due to kidneys    Medications Prior to Admission  Medication Sig  Dispense Refill   insulin aspart protamine - aspart (NOVOLOG MIX 70/30 FLEXPEN) (70-30) 100 UNIT/ML FlexPen Inject 5-10 Units into the skin with breakfast, with lunch, and with evening meal. (Patient not taking: Reported on 03/24/2023)     Insulin Glargine (BASAGLAR KWIKPEN) 100 UNIT/ML Inject 5 Units into the skin at bedtime. (Patient not taking: Reported on 03/24/2023)     lanthanum (FOSRENOL) 1000 MG chewable tablet Chew 1,000 mg by mouth 3 (three) times daily with meals. (Patient not taking: Reported on 03/24/2023)        Home:    Functional History:   Functional Status:  Mobility: Min assist for bed mobility Mod assist for transfers Mod assist with swedish walker to transfer to chair.     ADL: Mod assist for UB care Max assist for LB care  Cognition:     [facility] work up pasted from acute chart:   CXR-03/16/2023 10:01 AM EDT  Procedure: Frontal and lateral views of the chest.  Indication: Tube Position,  Line Position, Lung Aeration, Z95.2 Presence of prosthetic heart valve  Comparison: 03/15/2023.  Findings and impression: 1. Stable enlargement of cardiac and mediastinal contours. 2. Stable diffuse pulmonary opacities most likely representing edema. 3. Stable circumferential right pleural opacity, possibly loculated fluid or pleural thickening. 4. Left internal jugular venous catheter and feeding tube remaining in place.  Electronically Signed by:  Dierdre Forth, MD, [facility] Electronically Signed on:  03/16/2023 10:01 AM    Basic Metabolic panel: Recent Data from [facility] Related to Basic Metabolic Panel (BMP) Component 03/24/23 03/24/23 03/23/23 03/22/23 03/21/23 03/20/23  Sodium 132 Low  133 Low  131 Low  134 Low  132 Low  134 Low   Potassium --  --  3.8 4.0  4.1  3.9  Chloride 95 Low  96 Low  95 Low  95 Low  95 Low  95 Low   Carbon Dioxide (CO2) 26 27 24 27 25 26   Urea Nitrogen (BUN) 29 High  29 High  46 High  34 High  26 High  28  High   Creatinine 4.6 High  4.4 High  5.4 High  4.3 High  3.7 High  3.5 High   Glucose 126  129  80  199 High   218 High   243 High    Calcium 7.8 Low  7.8 Low  8.2 Low  8.1 Low  8.0 Low  8.3 Low   Anion Gap 11 10 12 12 12 13  High   BUN/CREA Ratio 6 7 9 8 7 8   Glomerular Filtration Rate (eGFR) 10  11  9  11  13  14       Complete blood count:  Component 03/24/23 03/23/23 03/22/23 03/21/23 03/20/23 03/19/23  WBC (White Blood Cell Count) 8.8 11.6 High  10.8 High  11.1 High  10.5 High  10.4 High   Hemoglobin 9.1 Low  9.3 Low  9.5 Low  9.6 Low  9.2 Low  9.1 Low   Hematocrit 29.9 Low  30.7 Low  31.9 Low  32.5 Low  31.6 Low  29.4 Low   Platelets 233 210 223 234 202 213  MCV (Mean Corpuscular Volume) 98 99 High  101 High  102 High  103 High  99 High   MCH (Mean Corpuscular Hemoglobin) 29.9 30.1 30.2 30.1 30.0 30.5  MCHC (Mean Corpuscular Hemoglobin Concentration) 30.4 Low  30.3 Low  29.8 Low  29.5 Low  29.1 Low  31.0  RBC (Red Blood Cell Count) 3.04 Low  3.09 Low  3.15 Low  3.19 Low  3.07 Low  2.98 Low   RDW-CV (Red Cell Distribution Width) 18.9 High  19.1 High  20.0 High  20.9 High  21.0 High  21.2 High   NRBC (Nucleated Red Blood Cell Count) 0.03 High  0.05 High  0.11 High  0.17 High  0.20 High  0.30 High   NRBC % (Nucleated Red Blood Cell %) 0.3 0.4 1.0 1.5 1.9 2.9  MPV (Mean Platelet Volume) 11.8 High  11.9 High  11.7 11.7 11.3 12.5 High   View all related data      Physical Exam: Blood pressure (!) 99/54, pulse 88, temperature 97.6 F (36.4 C), temperature source Axillary, resp. rate 16, last menstrual period 12/08/2013, SpO2 97 %. Physical Exam Vitals and nursing note reviewed. Exam conducted with a chaperone present.  Constitutional:      Appearance: Normal appearance.     Comments: 3L Oxygen per [place] sats 95%. Sitting up in bed  in NAD. Mildy anxious appearing.  Awake, alert, appropriate, NAD  HENT:     Head: Normocephalic.     Comments: R facial swelling and R facial droop-   Tongue midline    Right Ear: External ear normal.     Left Ear: External ear normal.     Nose: Nose normal. No congestion.     Mouth/Throat:     Mouth: Mucous membranes are dry.     Pharynx: Oropharynx is clear. No oropharyngeal exudate.  Eyes:     General:        Right eye: No discharge.        Left eye: No discharge.     Extraocular Movements: Extraocular movements intact.  Cardiovascular:     Rate and Rhythm: Tachycardia present. Rhythm irregular.     Heart sounds: Normal heart sounds.     No gallop.     Comments: Sternal incision and upper abd incision with sutures in place Pulmonary:     Comments: On 3L O2 by [place]- RR in low 20s Decreased breath sounds- mainly at bases B/L  Abdominal:     General: Bowel sounds are normal. There is no distension.     Palpations: Abdomen is soft.     Tenderness: There is no abdominal tenderness.  Musculoskeletal:     Cervical back: Neck supple. No tenderness.     Comments: Ue's 5-/5 B/L HF/KE 3+/5 B/L; DF/PF 4+/5 B/L  Skin:    General: Skin is warm and dry.     Comments: 2-3+ edema BLE- and some wooding in hands and arms- from reduced swelling L buttock pressure ulcer- Stage II- ~1.5 x 0.5 cm- pink- slightly open Incision closed on R chest and L chest from prior HD catheters Fistula L upper arm- (+) thrill  Neurological:     Mental Status: She is alert.     Comments: Right facial weakness without dysarthria. Speech slow but clear. Oriented X 4. She was able to follow simple motor commands. Anxious at times--does not like to be closed in. She was only able to see one number on the clock.   Intact to light touch in all 4 extremities per pt  Fully oriented  Psychiatric:     Comments: Somewhat anxious     No results found for this or any previous visit (from the past 48 hour(s)). No results found.    Blood pressure (!) 99/54, pulse 88, temperature 97.6 F (36.4 C), temperature source Axillary, resp. rate 16, last menstrual period  12/08/2013, SpO2 97 %.  Medical Problem List and Plan: 1. Functional deficits secondary to debility after MV repair with sternal precautions  -patient may  shower if cover incisions- HD catheters in chest are out- also has sternal incisions that need to be covered  -ELOS/Goals: 10-14 days supervision to min A 2.  Antithrombotics: -DVT/anticoagulation:  Pharmaceutical: Coumadin and Lovenox  -antiplatelet therapy: ASA 3. Pain Management:  Tylenol prn. Lidocaine patches prn. Denies pain 4. Mood/Behavior/Sleep: LCSW to follow for evaluation and support.   -antipsychotic agents: N/A 5. Neuropsych/cognition: This patient may not be fully capable of making decisions on her own behalf.  --delirium precautions 6. Skin/Wound Care: Routine pressure relief measures with frequent pressure relief measures for sacral decub.  --Interdry to skin folds.   --Add vitamin C and Zinc to promote wound healing 7. Fluids/Electrolytes/Nutrition: Strict I/O. Labs with HD.  --Will keep diet as regular to help with food choices  --Juven and nephro added. 8. MVR/TVR by Dr.  Felipa Furnace [phone]): Continue Sternal precautions.   --PPM for CHB  --coumadin with INR goal 2.5-3.5 9.  Hypotension: Monitor for symptoms with increase in activity  --on midodrine, Staterra and Droxidopa.  10.  ESRD: Schedule HD at the end of the day. Renal diet with 1200 cc FR.  11. T2DM: Monitor BS 5 X day with novolog 12 units w/breakfast and 15 units BID 12. A fib: Monitor HR TID. No BB due to hypotension.  --on coumadin. 13. Anemia of chronic disease: Monitor H/H with HD labs/  --transfuse prn Hgb <7.0 or if symptomatic.  14. Anxiety- pt cannot tolerate door/curtain closed 15. New O2 requirement with mild tachypnea - also has Fluid in LE's- likely fluid related, however will need to wean O2 as tolerated and monitor Vitals, to make sure these Sx's improve     Jacquelynn Cree, PA-C 03/24/2023   I have personally performed a face to  face diagnostic evaluation of this patient and formulated the key components of the plan.  Additionally, I have personally reviewed laboratory data, imaging studies, as well as relevant notes and concur with the physician assistant's documentation above.   The patient's status has not changed from the original H&P.  Any changes in documentation from the acute care chart have been noted above.

## 2023-03-24 NOTE — Progress Notes (Signed)
Standley Brooking, RN Rehab Admission Coordinator Physical Medicine and Rehabilitation   PMR Pre-admission Signed   Encounter Date: 03/19/2023  Related encounter: Documentation from 03/19/2023 in [facility] MEDICINE AND REHABILITATION   Signed      Show:Clear all Written[x] Templated[] Copied  Added by: Standley Brooking, RN[x] Genice Rouge, MD  Hover for details PMR Admission Coordinator Pre-Admission Assessment   Patient: Colleen [name] is an 62 y.o., female MRN: [id number] DOB: 01-30-1961 Height:   Weight:     Insurance Information HMO:     PPO:      PCP:      IPA:      80/20:      OTHER:  PRIMARY: Medicare a and b      Policy#: [id number]      Subscriber: pt Benefits:  Phone #: passport one source online     Name: 4/10 Eff. Date: 03/09/2019     Deduct: $1632      Out of Pocket Max: none      Life Max: none CIR: 100%      SNF: 20 full days Outpatient: 80%     Co-Pay: 20% Home Health: 100%      Co-Pay: none DME: 80%     Co-Pay: 20% Providers: in network  SECONDARY: Sharen Counter      Policy#: [id number]   Financial Counselor:       Phone#:    The "Data Collection Information Summary" for patients in Inpatient Rehabilitation Facilities with attached "Privacy Act Statement-Health Care Records" was provided and verbally reviewed with: Patient   Emergency Contact Information Contact Information       Name Relation Home Work Mobile    Theriault,Gerald Brother     [phone]    Shawan, Tosh Sister     [phone]    Fredricka Bonine     [phone]    Gathers,Saundra Mother     [phone]         Current Medical History  Patient Admitting Diagnosis: MVR, debility   History of Present Illness: 62 year old female with history of ESRD on hemodialysis , right nerve palsy, HTN, HL, T2 DM, history of DVT ( 2016) and chronic anemia. Recent evaluation for kidney transplant 11/25/22 with echo demonstrated normal LV function, moderate RV dysfunction ,  severe MR with moderate mitral stenosis and severe TR with calcified nodule noted on AV. Cardiac cath 2/20 demonstrated minimal CAD, mod pulm HTN, normal filling pressures and severe MR with moderate to severe mitral stenosis. [facility] for surgical repair.    On 02/19/23 underwent Mitral valve replacement, TV ring, AV decalcification with removal of LV mass. Complete heart block and postoperative atrial fibrillation. S/p STJ/Abbott leadless PPM placement 3/21. Afib treated with amiodarone load. Monitor labs and supplementing electrolytes as needed. Initially on CRRT and transitioned to IHD on 3/26. Some SOB and problems with UF on hemodialysis.  Hypotension with treatment with midodrine and Strattera and Droxidopa as needed. Beta blocker contraindicated due to hypotension. Anticoagulation with coumadin and Asa. Postop AMS with trazodone and melatonin eventually held as well as narcotics. Now resolved. Angioedema treated with decadron for initial 24 hrs on 3/15. Leukocytosis treated with empiric Cefepime and Vanc. Diarrhea resolving with cdiff negative and now removal of bowel tube to control diarrhea. Poor po intake. Initially treated with TF, now removed 3/28. Nutrition consulted . Continue regular diet, Nova source renal supplements and Renavite supplements. Pain management with lidocaine patches and prn tylenol holding  narcotics. Endocrine consulted or management of DM.   Patient's medical record from [facility] has been reviewed by the rehabilitation admission coordinator and physician.   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 Yes   Family History   family history is not on file.   Current Medications   Current Outpatient Medications:    AURYXIA 1 GM 210 MG(Fe) tablet, Take 2 tablets by mouth 2 (two) times daily with a meal., Disp: , Rfl:    HUMALOG KWIKPEN 100 UNIT/ML KwikPen, Inject 1-3 Units into the skin See admin instructions. Inject 1-3 units into the skin once a day, PER SLIDING SCALE, Disp: , Rfl:    Insulin Glargine (BASAGLAR KWIKPEN) 100 UNIT/ML, Inject 5 Units into the skin at bedtime., Disp: , Rfl:    lanthanum (FOSRENOL) 1000 MG chewable tablet, Chew 1,000 mg by mouth 3 (three) times daily with meals., Disp: , Rfl:    ondansetron (ZOFRAN) 4 MG tablet, Take 1 tablet (4 mg total) by mouth every 6 (six) hours as needed for nausea or vomiting., Disp: 60 tablet, Rfl: 3   Patients Current Diet: Diet Regular Renal diet   Precautions / Restrictions Precautions: Sternal Weight Bearing Restrictions: No Other Position/Activity Restrictions: sternal precautions   Has the patient had 2 or more falls or a fall with injury in the past year? No   Prior Activity Level Community (5-7x/wk): Independent without AD; driving   Prior Functional Level Self Care: Did the patient need help bathing, dressing, using the toilet or eating? Independent   Indoor Mobility: Did the patient need assistance with walking from room to room (with or without device)? Independent   Stairs: Did the patient need assistance with internal or external stairs (with or without device)? Independent   Functional Cognition: Did the patient need help planning regular tasks such as shopping or remembering to take medications? Independent   Patient Information Are you of Hispanic, Latino/a,or Spanish origin?: A. No, not of Hispanic, Latino/a, or Spanish origin What  is your race?: B. Black or African American Do you need or want an interpreter to communicate with a doctor or health care staff?: 0. No   Patient's Response To:  Health Literacy and Transportation Is the patient able to respond to health literacy and transportation needs?: Yes Health Literacy - How often do you need to have someone help you when you read instructions, pamphlets, or other written material from your doctor or pharmacy?: Never In the past 12 months, has lack of transportation kept you from medical appointments or from getting medications?: No In the past 12 months, has lack of transportation kept you from meetings, work, or from getting things needed for daily living?: No   Home Assistive Devices / Equipment None   Prior  Device Use: Indicate devices/aids used by the patient prior to current illness, exacerbation or injury? None of the above     Prior Functional Level Current Functional Level  Bed Mobility   Independent Min assist    Transfers   Independent   Mod assist    Mobility - Walk/Wheelchair   Independent   Mod assist (with swedish walker to chair)    Upper Body Dressing   Independent   Mod assist    Lower Body Dressing   Independent   Max assist    Grooming   Independent   Min assist    Eating/Drinking   Independent   Mod Independent    Toilet Transfer   Independent   Mod assist    Bladder Continence     Anuric    Anuric    Bowel Management    continent   Resolving diarhea  Stair Climbing   Independent   -- (not attempted)    Communication   independent   independent    Memory   intact intact      Special Needs/ Care Considerations Hemodialysis MWF outpatient [facility]; drove self   Previous Home Environment  Living Arrangements: Alone  Lives With: Alone Available Help at Discharge: Family; Available 24 hours/day (boyfriend's sister to assist 24/7 in her home at discharge) Type of Home:  Apartment Home Layout: One level Alternate Level Stairs-Rails: -- (flight) Home Access: Level entry Bathroom Shower/Tub: Engineer, manufacturing systems: Standard Bathroom Accessibility: Yes How Accessible: Accessible via wheelchair Home Care Services: No Additional Comments: was very active prior to 6 to 9 months ago but developed SOB and fatigue; was cycling on stationary bike in the past   Discharge Living Setting Plans for Discharge Living Setting: Patient's home; Alone; Apartment Type of Home at Discharge: Apartment Discharge Home Layout: One level Alternate Level Stairs-Number of Steps: none Discharge Home Access: Level entry Discharge Bathroom Shower/Tub: Tub/shower unit Discharge Bathroom Toilet: Standard Discharge Bathroom Accessibility: Yes How Accessible: Accessible via wheelchair Does the patient have any problems obtaining your medications?: No   Social/Family/Support Systems Contact Information: brother Earvin Hansen and friend, Minerva Areola Anticipated Caregiver: Erics's sister Emilie Rutter Anticipated Caregiver's Contact Information: see contacts with Minerva Areola Ability/Limitations of Caregiver: can provide 24/7 asisst initially Caregiver Availability: 24/7 Discharge Plan Discussed with Primary Caregiver: Yes Is Caregiver In Agreement with Plan?: Yes Does Caregiver/Family have Issues with Lodging/Transportation while Pt is in Rehab?: No   Goals Patient/Family Goal for Rehab: supervision to min assist with PT and OT Expected length of stay: ELOS 10 to 14 days Pt/Family Agrees to Admission and willing to participate: Yes Program Orientation Provided & Reviewed with Pt/Caregiver Including Roles  & Responsibilities: Yes Additional Information Needs: new address is [address] [place], [place]   Decrease burden of Care through IP rehab admission: n/a   Possible need for SNF placement upon discharge: not anticipated   Patient Condition: I have reviewed medical records from [facility], spoken with CM, and patient and family member. I discussed via phone for inpatient rehabilitation assessment.  Patient will benefit from ongoing PT and OT, can actively participate in 3 hours of therapy a day 5 days of the week, and can make measurable gains during the admission.  Patient will also benefit from the coordinated team approach during an Inpatient Acute Rehabilitation admission.  The patient will receive intensive therapy as well as Rehabilitation physician, nursing, social worker, and care management interventions.  Due to bladder management, bowel management, safety, skin/wound care, disease management, medication administration, pain management, and patient education the patient requires 24 hour a day rehabilitation nursing.  The patient is currently mod assist overall with mobility and basic ADLs.  Discharge setting and therapy post discharge at home with home health is anticipated.  Patient has agreed to participate in the Acute Inpatient Rehabilitation Program and will admit today.   Preadmission Screen Completed By:  Clois Dupes, RN MSN 03/24/2023 9:42 AM ______________________________________________________________________   Discussed status with Dr. Berline Chough on 03/24/23 at 0945 and received approval for admission today.   Admission Coordinator:  Clois Dupes, RN MSN time 1610 Date 03/24/23    Assessment/Plan: Diagnosis: Does the need for close, 24 hr/day Medical supervision in concert with the patient's rehab needs make it unreasonable for this patient to be served in a less intensive setting? Yes Co-Morbidities requiring supervision/potential complications: MVR, DM, ESRD on HD, HTN; chronic anemia Due to bladder management, bowel management, safety, skin/wound care, disease management, medication administration, pain management, and patient education, does the patient require 24 hr/day rehab nursing? Yes Does the patient require  coordinated care of a physician, rehab nurse, PT, OT, and SLP to address physical and functional deficits in the context of the above medical diagnosis(es)? Yes Addressing deficits in the following areas: balance, endurance, locomotion, strength, transferring, bowel/bladder control, bathing, dressing, feeding, grooming, and toileting Can the patient actively participate in an intensive therapy program of at least 3 hrs of therapy 5 days a week? Yes The potential for patient to make measurable gains while on inpatient rehab is good Anticipated functional outcomes upon discharge from inpatient rehab: supervision and min assist PT, supervision and min assist OT, n/a SLP Estimated rehab length of stay to reach the above functional goals is: 10-14 days Anticipated discharge destination: Home 10. Overall Rehab/Functional Prognosis: good   MD Signature          Cosigned by: Genice Rouge, MD at 03/24/2023 10:38 AM   Revision History

## 2023-03-24 NOTE — Progress Notes (Signed)
INPATIENT REHABILITATION ADMISSION NOTE   Arrival Method: ambulance     Mental Orientation:alert   Assessment:done   Skin:done with Mekides RN   IV'S:none   Pain:none   Tubes and Drains:none   Safety Measures:done   Vital Signs:done   Height and Weight: unable at this time ; pt does not want to lay down ; pt wants to sit up right   Rehab Orientation:   Family:mom and sister    Notes: Patent examiner, RN-BC, [employer]

## 2023-03-24 NOTE — Progress Notes (Addendum)
ANTICOAGULATION CONSULT NOTE - Initial Consult  Pharmacy Consult for Heparin IV drip bridging to Warfarin , INR goal 2.5-3.5  Indication: Mechanical MVR  Allergies  Allergen Reactions   Farxiga [Dapagliflozin] Other (See Comments)    Cannot take due to kidneys   Hydrocodone Other (See Comments)    "Increase Glucose level," per pt   Kombiglyze Xr [Saxagliptin-Metformin Er] Other (See Comments)    Cannot take due to kidneys    Patient Measurements: Height: 5' 3.86" (162.2 cm) (from [facility] records on 02/18/23) Weight: 75.1 kg (165 lb 9.1 oz) (from [facility] record  03/24/23.) IBW/kg (Calculated) : 54.37 Heparin Dosing Weight: 70.1 kg   Vital Signs: Temp: 97.6 F (36.4 C) (04/16 1444) Temp Source: Axillary (04/16 1444) BP: 99/54 (04/16 1444) Pulse Rate: 88 (04/16 1444)  Labs: Recent Labs    03/24/23 1547  LABPROT 24.6*  INR 2.2*  HEPARINUNFRC <0.10*    CrCl cannot be calculated (Patient's most recent lab result is older than the maximum 21 days allowed.).   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 (Patient not taking: Reported on 03/24/2023)   Not Taking   Insulin Glargine (BASAGLAR KWIKPEN) 100 UNIT/ML Inject 5 Units into the skin  at bedtime. (Patient not taking: Reported on 03/24/2023)   Not Taking   lanthanum (FOSRENOL) 1000 MG chewable tablet Chew 1,000 mg by mouth 3 (three) times daily with meals. (Patient not taking: Reported on 03/24/2023)   Not Taking    Assessment: Admit 03/24/23, patient admitted to [facility] , from [facility] (3/14-4/16/24) s/pMVR with valvuloplasty TV with ring insertion, AV decalcification with removal of LV mass 02/19/23. Then developed CHB and afib post-op and on 3/21 underwent leadless PPM placement.   Pharmacy consult for IV heparin and Warfarin , goal INR 2.5-3.5 for mechantcal MVR.   She also has a history of DVT/PE. 2016, 2019)  Patient reports she was not taking any anticoagulation prior to the [facility] admission, and it has been years since she has taken Eliquis.   At [facility] she was started on Warfarin .  She received Heparin IV 1300 units/hr 4/13-4/15 then hehparin drip was discontinued on  4/15 (PTT was 51.3 sec on 4/15) , when INR 1.8.  Today the INR = 2.3 (4/16 AM) at [facility].  Goal INR 2.5-3.5 INR drawn  admit 4/16 = 2.2 She received  Warfarin   , , , , 3.5mg  ,3.5mg   from 4/10 -03/23/23 at [facility],   Warfarin started before this date but unclear in records when warfarin started.  Daily HL, CBC and INR   Goal of Therapy:  INR  2.5-3.5 Heparin level 0.3-0.7 units/ml    Plan:  Start heparin infusion, no bolus, at 1100 units/hr (16 units/kg/hr) Check 6hr HL @[facility]  Give Warfarin 3.75mg  todayDaily HL, CBC and INR.  Plan to discontinue IV heparin infusion when INR  =/>2.5.  Daily HL, INR and CBC  Thank you for allowing pharmacy to be part of this patients care team. Noah Delaine, RPh Clinical Pharmacist  03/24/2023,5:13 PM

## 2023-03-25 DIAGNOSIS — R5381 Other malaise: Secondary | ICD-10-CM | POA: Diagnosis not present

## 2023-03-25 LAB — CBC
HCT: 31.1 % — ABNORMAL LOW (ref 36.0–46.0)
HCT: 31.1 % — ABNORMAL LOW (ref 36.0–46.0)
Hemoglobin: 9.4 g/dL — ABNORMAL LOW (ref 12.0–15.0)
Hemoglobin: 9.6 g/dL — ABNORMAL LOW (ref 12.0–15.0)
MCH: 29 pg (ref 26.0–34.0)
MCH: 29.1 pg (ref 26.0–34.0)
MCHC: 30.2 g/dL (ref 30.0–36.0)
MCHC: 30.9 g/dL (ref 30.0–36.0)
MCV: 94.2 fL (ref 80.0–100.0)
MCV: 96 fL (ref 80.0–100.0)
Platelets: 175 10*3/uL (ref 150–400)
Platelets: 209 10*3/uL (ref 150–400)
RBC: 3.24 MIL/uL — ABNORMAL LOW (ref 3.87–5.11)
RBC: 3.3 MIL/uL — ABNORMAL LOW (ref 3.87–5.11)
RDW: 18.6 % — ABNORMAL HIGH (ref 11.5–15.5)
RDW: 18.7 % — ABNORMAL HIGH (ref 11.5–15.5)
WBC: 8.6 10*3/uL (ref 4.0–10.5)
WBC: 9.1 10*3/uL (ref 4.0–10.5)
nRBC: 0.2 % (ref 0.0–0.2)
nRBC: 0.4 % — ABNORMAL HIGH (ref 0.0–0.2)

## 2023-03-25 LAB — HEPATITIS B SURFACE ANTIBODY, QUANTITATIVE: Hep B S AB Quant (Post): 473 m[IU]/mL (ref >9.9)

## 2023-03-25 LAB — GLUCOSE, CAPILLARY
Glucose-Capillary: 115 mg/dL — ABNORMAL HIGH (ref 70–99)
Glucose-Capillary: 165 mg/dL — ABNORMAL HIGH (ref 70–99)
Glucose-Capillary: 243 mg/dL — ABNORMAL HIGH (ref 70–99)

## 2023-03-25 LAB — HEPARIN LEVEL (UNFRACTIONATED)
Heparin Unfractionated: 0.1 IU/mL — ABNORMAL LOW (ref 0.30–0.70)
Heparin Unfractionated: 0.29 IU/mL — ABNORMAL LOW (ref 0.30–0.70)

## 2023-03-25 LAB — PROTIME-INR
INR: 2.4 — ABNORMAL HIGH (ref 0.8–1.2)
INR: 2.5 — ABNORMAL HIGH (ref 0.8–1.2)
Prothrombin Time: 25.6 seconds — ABNORMAL HIGH (ref 11.4–15.2)
Prothrombin Time: 26.7 seconds — ABNORMAL HIGH (ref 11.4–15.2)

## 2023-03-25 LAB — VITAMIN B12: Vitamin B-12: 668 pg/mL (ref 180–914)

## 2023-03-25 LAB — VITAMIN D 25 HYDROXY (VIT D DEFICIENCY, FRACTURES): Vit D, 25-Hydroxy: 22.85 ng/mL — ABNORMAL LOW (ref 30–100)

## 2023-03-25 LAB — MAGNESIUM: Magnesium: 2 mg/dL (ref 1.7–2.4)

## 2023-03-25 MED ORDER — WARFARIN SODIUM 4 MG PO TABS
4.0000 mg | ORAL_TABLET | Freq: Once | ORAL | Status: AC
  Administered 2023-03-25: 4 mg via ORAL
  Filled 2023-03-25: qty 1

## 2023-03-25 MED ORDER — DARBEPOETIN ALFA 60 MCG/0.3ML IJ SOSY
60.0000 ug | PREFILLED_SYRINGE | INTRAMUSCULAR | Status: DC
  Administered 2023-03-27 – 2023-04-03 (×2): 60 ug via SUBCUTANEOUS
  Filled 2023-03-25 (×2): qty 0.3

## 2023-03-25 MED ORDER — VITAMIN D (ERGOCALCIFEROL) 1.25 MG (50000 UNIT) PO CAPS
50000.0000 [IU] | ORAL_CAPSULE | ORAL | Status: DC
  Administered 2023-03-25 – 2023-04-08 (×3): 50000 [IU] via ORAL
  Filled 2023-03-25 (×3): qty 1

## 2023-03-25 MED ORDER — MELATONIN 5 MG PO TABS
5.0000 mg | ORAL_TABLET | Freq: Every day | ORAL | Status: DC
  Administered 2023-03-25: 5 mg via ORAL
  Filled 2023-03-25: qty 1

## 2023-03-25 MED ORDER — LOPERAMIDE HCL 2 MG PO CAPS
2.0000 mg | ORAL_CAPSULE | ORAL | Status: DC | PRN
  Administered 2023-03-25: 2 mg via ORAL
  Filled 2023-03-25: qty 1

## 2023-03-25 MED ORDER — HEPARIN SODIUM (PORCINE) 1000 UNIT/ML DIALYSIS: 3000.0000 [IU] | Freq: Once | INTRAMUSCULAR | Status: DC

## 2023-03-25 MED ORDER — SODIUM CHLORIDE 0.9 % IV SOLN: INTRAVENOUS | Status: AC

## 2023-03-25 MED ORDER — MELATONIN 3 MG PO TABS: 3.0000 mg | ORAL_TABLET | Freq: Every day | ORAL | Status: DC

## 2023-03-25 NOTE — Progress Notes (Signed)
Inpatient Rehabilitation Center Individual Statement of Services  Patient Name:  Colleen [name]  Date:  03/25/2023  Welcome to the Inpatient Rehabilitation Center.  Our goal is to provide you with an individualized program based on your diagnosis and situation, designed to meet your specific needs.  With this comprehensive rehabilitation program, you will be expected to participate in at least 3 hours of rehabilitation therapies Monday-Friday, with modified therapy programming on the weekends.  Your rehabilitation program will include the following services:  Physical Therapy (PT), Occupational Therapy (OT), Speech Therapy (ST), 24 hour per day rehabilitation nursing, Therapeutic Recreaction (TR), Neuropsychology, Care Coordinator, Rehabilitation Medicine, Nutrition Services, Pharmacy Services, and Other  Weekly team conferences will be held on Wednesdays to discuss your progress.  Your Inpatient Rehabilitation Care Coordinator will talk with you frequently to get your input and to update you on team discussions.  Team conferences with you and your family in attendance may also be held.  Expected length of stay:  10-14 Days  Overall anticipated outcome: supervision to min assist   Depending on your progress and recovery, your program may change. Your Inpatient Rehabilitation Care Coordinator will coordinate services and will keep you informed of any changes. Your Inpatient Rehabilitation Care Coordinator's name and contact numbers are listed  below.  The following services may also be recommended but are not provided by the Inpatient Rehabilitation Center:   Home Health Rehabiltiation Services Outpatient Rehabilitation Services    Arrangements will be made to provide these services after discharge if needed.  Arrangements include referral to agencies that provide these services.  Your insurance has been verified to be:   Medicare A & B Your primary doctor is:  Irena Reichmann,  DO  Pertinent information will be shared with your doctor and your insurance company.  Inpatient Rehabilitation Care Coordinator:  Lavera Guise, [place] [phone] or [phone]  Information discussed with and copy given to patient by: Andria Rhein, 03/25/2023, 9:10 AM

## 2023-03-25 NOTE — Progress Notes (Signed)
Inpatient Rehabilitation  Patient information reviewed and entered into eRehab system by Prospero Mahnke Deontez Klinke, OTR/L, Rehab Quality Coordinator.   Information including medical coding, functional ability and quality indicators will be reviewed and updated through discharge.   

## 2023-03-25 NOTE — Progress Notes (Signed)
PROGRESS NOTE   Subjective/Complaints: +insomnia due to being in the hospital, discussed increasing melatonin and she is agreeable, did not feel nighttime interruptions were an issue  ROS: +insomnia   Objective:   No results found. Recent Labs    03/25/23 0030 03/25/23 1053  WBC 9.1 8.6  HGB 9.4* 9.6*  HCT 31.1* 31.1*  PLT 209 175   No results for input(s): "NA", "K", "CL", "CO2", "GLUCOSE", "BUN", "CREATININE", "CALCIUM" in the last 72 hours.  Intake/Output Summary (Last 24 hours) at 03/25/2023 1224 Last data filed at 03/25/2023 8453 Gross per 24 hour  Intake 340 ml  Output --  Net 340 ml     Pressure Injury 03/24/23 Buttocks Left Stage 2 -  Partial thickness loss of dermis presenting as a shallow open injury with a red, pink wound bed without slough. (Active)  03/24/23 1500  Location: Buttocks  Location Orientation: Left  Staging: Stage 2 -  Partial thickness loss of dermis presenting as a shallow open injury with a red, pink wound bed without slough.  Wound Description (Comments):   Present on Admission:     Physical Exam: Vital Signs Blood pressure (!) 100/55, pulse 88, temperature 99.1 F (37.3 C), temperature source Axillary, resp. rate 19, height 5' 3.86" (1.622 m), weight 75.4 kg, last menstrual period 12/08/2013, SpO2 95 %. Constitutional:      Appearance: Normal appearance.     Comments: 3L Oxygen per [place]an sats 95%. Sitting up in bed in NAD. Mildy anxious appearing.  Awake, alert, appropriate, NAD  HENT:     Head: Normocephalic.     Comments: R facial swelling and R facial droop-  Tongue midline    Right Ear: External ear normal.     Left Ear: External ear normal.     Nose: Nose normal. No congestion.     Mouth/Throat:     Mouth: Mucous membranes are dry.     Pharynx: Oropharynx is clear. No oropharyngeal exudate.  Eyes:     General:        Right eye: No discharge.        Left eye: No discharge.      Extraocular Movements: Extraocular movements intact.  Cardiovascular:     Rate and Rhythm: Tachycardia present. Rhythm irregular.     Heart sounds: Normal heart sounds.     No gallop.     Comments: Sternal incision and upper abd incision with sutures in place Pulmonary:     Comments: On 3L O2 by [place]- RR in low 20s Decreased breath sounds- mainly at bases B/L  Abdominal:     General: Bowel sounds are normal. There is no distension.     Palpations: Abdomen is soft.     Tenderness: There is no abdominal tenderness.  Musculoskeletal:     Cervical back: Neck supple. No tenderness.     Comments: Ue's 5-/5 B/L HF/KE 3+/5 B/L; DF/PF 4+/5 B/L  Skin:    General: Skin is warm and dry.     Comments: 2-3+ edema BLE- and some wooding in hands and arms- from reduced swelling L buttock pressure ulcer- Stage II- ~1.5 x 0.5 cm- pink- slightly open Incision closed on  R chest and L chest from prior HD catheters Fistula L upper arm- (+) thrill  Neurological:     Mental Status: She is alert.     Comments: Right facial weakness without dysarthria. Speech slow but clear. Oriented X 4. She was able to follow simple motor commands. Anxious at times--does not like to be closed in. She was only able to see one number on the clock.   Intact to light touch in all 4 extremities per pt  Fully oriented  Psychiatric:     Comments: Somewhat anxious    Assessment/Plan: 1. Functional deficits which require 3+ hours per day of interdisciplinary therapy in a comprehensive inpatient rehab setting. Physiatrist is providing close team supervision and 24 hour management of active medical problems listed below. Physiatrist and rehab team continue to assess barriers to discharge/monitor patient progress toward functional and medical goals  Care Tool:  Bathing    Body parts bathed by patient: Right arm, Left arm, Chest, Abdomen, Right upper leg, Left upper leg, Face   Body parts bathed by helper: Front perineal  area, Buttocks, Right lower leg, Left lower leg     Bathing assist Assist Level: Maximal Assistance - Patient 24 - 49%     Upper Body Dressing/Undressing Upper body dressing   What is the patient wearing?: Pull over shirt    Upper body assist Assist Level: Total Assistance - Patient < 25%    Lower Body Dressing/Undressing Lower body dressing      What is the patient wearing?: Incontinence brief, Pants     Lower body assist Assist for lower body dressing: Total Assistance - Patient < 25%     Toileting Toileting    Toileting assist Assist for toileting: Total Assistance - Patient < 25%     Transfers Chair/bed transfer  Transfers assist  Chair/bed transfer activity did not occur: Safety/medical concerns (weakness, fear)        Locomotion Ambulation   Ambulation assist   Ambulation activity did not occur: Safety/medical concerns (fatigue, fear, generalized weakness)          Walk 10 feet activity   Assist  Walk 10 feet activity did not occur: Safety/medical concerns (fatigue, fear, generalized weakness)        Walk 50 feet activity   Assist Walk 50 feet with 2 turns activity did not occur: Safety/medical concerns (fatigue, fear, generalized weakness)         Walk 150 feet activity   Assist Walk 150 feet activity did not occur: Safety/medical concerns (fatigue, fear, generalized weakness)         Walk 10 feet on uneven surface  activity   Assist Walk 10 feet on uneven surfaces activity did not occur: Safety/medical concerns (fatigue, fear, generalized weakness)         Wheelchair     Assist Is the patient using a wheelchair?: Yes Type of Wheelchair: Manual Wheelchair activity did not occur: Safety/medical concerns (fatigue, fear, generalized weakness)         Wheelchair 50 feet with 2 turns activity    Assist    Wheelchair 50 feet with 2 turns activity did not occur: Safety/medical concerns (fatigue, fear,  generalized weakness)       Wheelchair 150 feet activity     Assist  Wheelchair 150 feet activity did not occur: Safety/medical concerns (fatigue, fear, generalized weakness)       Blood pressure (!) 100/55, pulse 88, temperature 99.1 F (37.3 C), temperature source Axillary, resp. rate 19,  height 5' 3.86" (1.622 m), weight 75.4 kg, last menstrual period 12/08/2013, SpO2 95 %.    Medical Problem List and Plan: 1. Functional deficits secondary to debility after MV repair with sternal precautions             -patient may  shower if cover incisions- HD catheters in chest are out- also has sternal incisions that need to be covered             -ELOS/Goals: 10-14 days supervision to min A 2.  Antithrombotics: -DVT/anticoagulation:  Pharmaceutical: Coumadin and Lovenox             -antiplatelet therapy: ASA 3. Pain Management:  Tylenol prn. Lidocaine patches prn. Denies pain 4. Insomnia: increase melatonin to  HS 5. Anxiety: neuropsych consulted 6. Skin/Wound Care: Routine pressure relief measures with frequent pressure relief measures for sacral decub.             --Interdry to skin folds.              --Add vitamin C and Zinc to promote wound healing 7. Fluids/Electrolytes/Nutrition: Strict I/O. Labs with HD.  --Will keep diet as regular to help with food choices             --Juven and nephro added. 8. MVR/TVR by Dr. Felipa Furnace [phone]): Continue Sternal precautions.              --PPM for CHB             --coumadin with INR goal 2.5-3.5 9.  Hypotension: Monitor for symptoms with increase in activity             --on midodrine, Staterra and Droxidopa.  10.  ESRD: Schedule HD at the end of the day. Renal diet with 1200 cc FR.  11. T2DM: Monitor BS 5 X day with novolog 12 units w/breakfast and 15 units BID 12. A fib: Monitor HR TID. No BB due to hypotension.             --on coumadin. 13. Anemia of chronic disease: Monitor H/H with HD labs/             --transfuse prn Hgb  <7.0 or if symptomatic.  14. Anxiety- pt cannot tolerate door/curtain closed 15. New O2 requirement with mild tachypnea - also has Fluid in LE's- likely fluid related, however will need to wean O2 as tolerated and monitor Vitals, to make sure these Sx's improve 16. Overweight: provide dietary education 17. Vitamin D insufficiency: start ergocalciferol 50,000U once per week for 7 weeks  >50 minutes spent in discussion of patient's history, medical issues, and first day of therapy during team conference today, adding imodium for her diarrhea, review of her chart and labs  LOS: 1 days A FACE TO FACE EVALUATION WAS PERFORMED  Drema Pry Joshaua Epple 03/25/2023, 12:24 PM

## 2023-03-25 NOTE — Patient Care Conference (Signed)
Inpatient RehabilitationTeam Conference and Plan of Care Update Date: 03/25/2023   Time: 11:50 AM    Patient Name: Colleen [name]      Medical Record Number: [id number]  Date of Birth: [birth year] Sex: Female         Room/Bed: 4W11C/4W11C-01 Payor Info: Payor: MEDICARE / Plan: MEDICARE PART A AND B / Product Type: *No Product type* /    Admit Date/Time:  03/24/2023  2:00 PM  Primary Diagnosis:  Debility  Hospital Problems: Principal Problem:   Debility Active Problems:   ESRD on dialysis   Pressure injury of skin   S/P MVR (mitral valve repair)    Expected Discharge Date: Expected Discharge Date: 04/11/23  Team Members Present: Physician leading conference: Dr. Sula Soda Social Worker Present: Lavera Guise, BSW Nurse Present: Chana Bode, RN PT Present: Raechel Chute, PT OT Present: Candee Furbish, OT PPS Coordinator present : Fae Pippin, SLP     Current Status/Progress Goal Weekly Team Focus  Bowel/Bladder      Incontinent of loose stools; on imodium; anuric with HD; M-W-F    Continent of bowel; managed with prn medications    Toileting protocol, Imodium prn  Swallow/Nutrition/ Hydration               ADL's   Max A UB dressing, Max-total A LB at bed level, Max-total A toileting at bed level   Min A   ADL retraining, endurance, cog, vision, functional transfers    Mobility   transfers with RW mod/max A   supervision/mod I  functional mobility/transfers, generalized strengthening and endurance, dynamic standing balance/coordination, gait training, DME, D/C planning, and caregiver training    Communication                Safety/Cognition/ Behavioral Observations               Pain     Using tylenol and lidocaine patches to keep Pain managed at or below level 4     Pain < 4 with prns    Assess need for and effectiveness of pain med  Skin      Sternotomy site; stage 2 to sacrum   Incision healing; wound healing    Monitor skin  q shift Wound care treatment plan activation    Discharge Planning:  New admission, assesment pending  Team Discussion: Patient with stage 2 pressure ulcer on buttock post sternotomy for mitral valve replacement; cardiopulmonary bypass with sternal precautions. Progress limited by extreme anxiety, claustrophobia, deconditioning and eccentric tendencies. Oxygen dependent at present.   Patient on target to meet rehab goals: yes, currently needs max assist for upper body care and total assist for lower body care with max assist for toileting. Needs max assist for slide-board transfers. Goals set for min assist overall.   *See Care Plan and progress notes for long and short-term goals.   Revisions to Treatment Plan:  Melatonin for insomnia Imodium for bowel management Neuro psych referral   Teaching Needs: Safety, sternal precautions, medications, skin care, dietary modifications, transfers, etc.  Current Barriers to Discharge: Decreased caregiver support, Home enviroment access/layout, and Weight bearing restrictions  Possible Resolutions to Barriers: Discharge education with friends     Medical Summary Current Status: overweight, hypotensive, ESRD, sternal incision, uncontrolled blood sugars, diarrhea  Barriers to Discharge: Renal Insufficiency/Failure;Hypotension;Medical stability;Complicated Wound  Barriers to Discharge Comments: overweight, hypotensive, ESRD, sternal incision, uncontrolled blood sugars, diarrhea Possible Resolutions to [employer] Focus: provided dietary education, continue HD, monitor incision daily, conitnue  to monitor CBGs TID, imodium added prn versus discussion of enema to clear her out   Continued Need for Acute Rehabilitation Level of Care: The patient requires daily medical management by a physician with specialized training in physical medicine and rehabilitation for the following reasons: Direction of a multidisciplinary physical rehabilitation  program to maximize functional independence : Yes Medical management of patient stability for increased activity during participation in an intensive rehabilitation regime.: Yes Analysis of laboratory values and/or radiology reports with any subsequent need for medication adjustment and/or medical intervention. : Yes   I attest that I was present, lead the team conference, and concur with the assessment and plan of the team.   Chana Bode B 03/25/2023, 1:54 PM

## 2023-03-25 NOTE — Progress Notes (Signed)
Pt called out and showed this nurse a bloody mucus plug about the size of a quarter that came from her nose. Pt currently has heparin infusion running 27ml/hr continuous. No signs of active bleeding currently. Pt stated that this has happened many times previously on another floor and that they were much larger. This nurse will call respiratory for humidifier to provide moisture to her nasal cannal. Currently on 2L O2  Continuing to monitor. Charge nurse made aware. Call bell in reach.

## 2023-03-25 NOTE — Progress Notes (Signed)
Inpatient Rehabilitation Admission Medication Review by a Pharmacist  A complete drug regimen review was completed for this patient to identify any potential clinically significant medication issues.  High Risk Drug Classes Is patient taking? Indication by Medication  Antipsychotic No   Anticoagulant Yes, as an intravenous medication Heparin and warfarin for mechanical MVR Heparin DC when INR =/>2.5  Antibiotic No   Opioid No   Antiplatelet Yes ASA- CAD  Hypoglycemics/insulin Yes SSI,  novolog TIDWC- DM   Vasoactive Medication Yes Amiodarone-Afib  Chemotherapy No   Other Yes Atomexetine, droxidopa, midodrine-hypotension Darbepoetin SQ- anemia due to ESRD Melatonin- sleep Protonix-GERD   Sensipar- ESRD  Secondary hyperparathyroidism      Type of Medication Issue Identified Description of Issue Recommendation(s)  Drug Interaction(s) (clinically significant)     Duplicate Therapy     Allergy     No Medication Administration End Date     Incorrect Dose     Additional Drug Therapy Needed     Significant med changes from prior encounter (inform family/care partners about these prior to discharge).    Other  PTA-   novolog 70/30, insulin glargine, lanthanum ,, januvia, auryxia not resumed. Restart PTA meds when and if necessary during CIR admission or at time of discharge, if warranted     Clinically significant medication issues were identified that warrant physician communication and completion of prescribed/recommended actions by midnight of the next day:  No  Name of provider notified for urgent issues identified:   Provider Method of Notification:     Pharmacist comments:   Time spent performing this drug regimen review (minutes):  30   Noah Delaine, [place] Clinical Pharmacist  03/25/2023 12:44 PM

## 2023-03-25 NOTE — Plan of Care (Signed)
  Problem: RH Balance Goal: LTG: Patient will maintain dynamic sitting balance (OT) Description: LTG:  Patient will maintain dynamic sitting balance with assistance during activities of daily living (OT) Flowsheets (Taken 03/25/2023 1207) LTG: Pt will maintain dynamic sitting balance during ADLs with: Supervision/Verbal cueing Goal: LTG Patient will maintain dynamic standing with ADLs (OT) Description: LTG:  Patient will maintain dynamic standing balance with assist during activities of daily living (OT)  Flowsheets (Taken 03/25/2023 1207) LTG: Pt will maintain dynamic standing balance during ADLs with: Supervision/Verbal cueing   Problem: Sit to Stand Goal: LTG:  Patient will perform sit to stand in prep for activites of daily living with assistance level (OT) Description: LTG:  Patient will perform sit to stand in prep for activites of daily living with assistance level (OT) Flowsheets (Taken 03/25/2023 1207) LTG: PT will perform sit to stand in prep for activites of daily living with assistance level: Supervision/Verbal cueing   Problem: RH Bathing Goal: LTG Patient will bathe all body parts with assist levels (OT) Description: LTG: Patient will bathe all body parts with assist levels (OT) Flowsheets (Taken 03/25/2023 1207) LTG: Pt will perform bathing with assistance level/cueing: Minimal Assistance - Patient > 75%   Problem: RH Dressing Goal: LTG Patient will perform upper body dressing (OT) Description: LTG Patient will perform upper body dressing with assist, with/without cues (OT). Flowsheets (Taken 03/25/2023 1207) LTG: Pt will perform upper body dressing with assistance level of: Supervision/Verbal cueing Goal: LTG Patient will perform lower body dressing w/assist (OT) Description: LTG: Patient will perform lower body dressing with assist, with/without cues in positioning using equipment (OT) Flowsheets (Taken 03/25/2023 1207) LTG: Pt will perform lower body dressing with assistance  level of: Minimal Assistance - Patient > 75%   Problem: RH Toileting Goal: LTG Patient will perform toileting task (3/3 steps) with assistance level (OT) Description: LTG: Patient will perform toileting task (3/3 steps) with assistance level (OT)  Flowsheets (Taken 03/25/2023 1207) LTG: Pt will perform toileting task (3/3 steps) with assistance level: Minimal Assistance - Patient > 75%   Problem: RH Toilet Transfers Goal: LTG Patient will perform toilet transfers w/assist (OT) Description: LTG: Patient will perform toilet transfers with assist, with/without cues using equipment (OT) Flowsheets (Taken 03/25/2023 1207) LTG: Pt will perform toilet transfers with assistance level of: Contact Guard/Touching assist   Problem: RH Tub/Shower Transfers Goal: LTG Patient will perform tub/shower transfers w/assist (OT) Description: LTG: Patient will perform tub/shower transfers with assist, with/without cues using equipment (OT) Flowsheets (Taken 03/25/2023 1207) LTG: Pt will perform tub/shower stall transfers with assistance level of: Contact Guard/Touching assist   Problem: RH Awareness Goal: LTG: Patient will demonstrate awareness during functional activites type of (OT) Description: LTG: Patient will demonstrate awareness during functional activites type of (OT) Flowsheets (Taken 03/25/2023 1207) Patient will demonstrate awareness during functional activites type of: Anticipatory LTG: Patient will demonstrate awareness during functional activites type of (OT): Modified Independent

## 2023-03-25 NOTE — Progress Notes (Signed)
Physical Therapy Session Note  Patient Details  Name: ZEE PFUHL MRN: [id number] Date of Birth: 11/01/1961  Today's Date: 03/25/2023 PT Missed Time: 75 Minutes Missed Time Reason: Unavailable (Comment) (off unit for dialysis)  Short Term Goals: Week 1:  PT Short Term Goal 1 (Week 1): pt will transfer sit<>stand with LRAD and min A PT Short Term Goal 2 (Week 1): pt will transfer bed<>chair with LRAD and min A PT Short Term Goal 3 (Week 1): pt will ambulate 25ft with LRAD and min A  Skilled Therapeutic Interventions/Progress Updates:   Pt off unit for dialysis. Will attempt to make up missed time as schedule allows. 75 minutes missed of skilled physical therapy.   Therapy Documentation Precautions:  Precautions Precautions: Sternal, Fall, ICD/Pacemaker Precaution Comments: Anxious. Monitor (O2 > 90%), and HR Restrictions Weight Bearing Restrictions: No Other Position/Activity Restrictions: sternal precautions  Therapy/Group: Individual Therapy Martin Majestic PT, DPT  03/25/2023, 7:29 AM

## 2023-03-25 NOTE — Progress Notes (Addendum)
Pt receives out-pt HD at [facility] [place] on MWF. Pt's clinic advised of pt's transfer from [facility] to [facility] inpt rehab. Will assist as needed.   Olivia Canter Renal Navigator [phone]

## 2023-03-25 NOTE — Progress Notes (Signed)
[facility] Progress Note   Subjective:  Seen in room - working with PT. She is scheduled for HD later today. Denies CP/dyspnea at the moment. Quite edematous.  Objective Vitals:   03/24/23 1444 03/24/23 1600 03/24/23 1930 03/25/23 0402  BP: (!) 99/54  (!) 90/48 (!) 100/55  Pulse: 88     Resp: 16  16 19   Temp: 97.6 F (36.4 C)  98.8 F (37.1 C) 99.1 F (37.3 C)  TempSrc: Axillary  Axillary Axillary  SpO2: 97%  96% 95%  Weight:  75.1 kg  75.4 kg  Height:  5' 3.86" (1.622 m)     Physical Exam General: Well appearing woman, NAD. Nasal O2 in place Heart:RRR; no murmur; healing mid-line chest incision Lungs:Bibasilar rales present, clear in upper lobes Abdomen: soft Extremities: 2+ BLE edema and 2+ RUE edema Dialysis Access: LUE AVF + bruit  Additional Objective Labs:  CBC: Recent Labs  Lab 03/25/23 0030  WBC 9.1  HGB 9.4*  HCT 31.1*  MCV 96.0  PLT 209   CBG: Recent Labs  Lab 03/24/23 1638 03/24/23 2134 03/25/23 0350  GLUCAP 198* 136* 115*   Medications:  heparin 1,300 Units/hr (03/25/23 0222)    amiodarone  200 mg Oral Daily   aspirin EC  81 mg Oral Daily   atomoxetine  18 mg Oral Daily   Chlorhexidine Gluconate Cloth  6 each Topical Q0600   cinacalcet  60 mg Oral Q M,W,F   droxidopa  500 mg Oral TID WC   feeding supplement (NEPRO CARB STEADY)  237 mL Oral TID WC   insulin aspart  0-5 Units Subcutaneous QHS   insulin aspart  0-6 Units Subcutaneous TID WC   insulin aspart  12 Units Subcutaneous QPC breakfast   insulin aspart  15 Units Subcutaneous BID AC   melatonin  5 mg Oral QHS   midodrine  20 mg Oral TID WC   multivitamin  1 tablet Oral QHS   nutrition supplement (JUVEN)  1 packet Oral BID BM   pantoprazole  40 mg Oral BID   simethicone  80 mg Oral QID   Warfarin - Pharmacist Dosing Inpatient   Does not apply q1600    Dialysis Orders: MWF SW 3h  400/1.5  69.5kg 2K/2Ca bath  Heparin 3000  AVF LUA - last OP HD 3/13, post wt  69.5kg - sensipar 60mg  po tiw - hectorol 3 mcg IV tiw - venofer 50 mg IV weekly  61yo F s/p [facility] admit 3/13-4/16/24 for MV replacement, TV ring, AV decalcification with removal of LV mass. Post-op, she have A-fib and CHB, underwent leadless PPM on 02/26/23. Hospitalization c/b hypotension, on midodrine + Strattera + Droxidopa. Will be on warfarin + ASA for coagulation. Transferred to CIR for rehab.  Assessment/Plan: 1. Valvular heart disease s/p MVR, TV ring, AoV decalcification with removal LV mass 02/18/23: On warfarin for anticoagulation. 2. ESRD: Will continue outpatient HD schedule - for HD today. She refuses to run > 3hr treatment - reiterates this to me today. 3. HyPOTN/volume: On midodrine 20mg  TID, droxidopa 500mg  TID, and atomoxetine 18mg  daily for hypotension. She is very edematous - will see what we can get off with dialysis, hopefully start slowly getting her weight down.  4. Anemia of ESRD: Hgb 9.4 - hard to tell when last ESA was given, will order Aranesp to start this week. 5. Secondary hyperparathyroidism:  Ca/Phos pending, on cinacalcet 60mg  TIW. Wait to see labs before resuming VDRA and/or binders. 6. Nutrition:  On protein supplements 7. T2DM: On insulin, per primary. 8. Debility: In CIR - follow progress.  Colleen Hoyle, PA-C 03/25/2023, 10:42 AM  [employer]

## 2023-03-25 NOTE — Evaluation (Signed)
Physical Therapy Assessment and Plan  Patient Details  Name: Colleen [name] MRN: [id number] Date of Birth: 1961/05/24  PT Diagnosis: Abnormal posture, Abnormality of gait, Difficulty walking, Edema, and Muscle weakness Rehab Potential: Good ELOS: 2.5 weeks   Today's Date: 03/25/2023 PT Individual Time: 4098-1191 PT Individual Time Calculation (min): 58 min    Hospital Problem: Principal Problem:   Debility Active Problems:   ESRD on dialysis   Pressure injury of skin   S/P MVR (mitral valve repair)   Past Medical History:  Past Medical History:  Diagnosis Date   Anemia    Diabetes    type II   DVT (deep venous thrombosis)    E-coli UTI    ESRD on hemodialysis    MWF at [facility]   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    Gout 06/05/2018   History of blood transfusion    History of claustrophobia    HLD (hyperlipidemia) 06/05/2018   Hypertension    PE (pulmonary embolism)    Pulled muscle    pt has right sided facial droop from pulled muscle in face since birth   Retinopathy    Was going blind and had surgery with improvement   Past Surgical History:  Past Surgical History:  Procedure Laterality Date   A/V FISTULAGRAM Left 07/29/2019   Procedure: A/V FISTULAGRAM;  Surgeon: Sherren Kerns, MD;  Location: [facility] INVASIVE CV LAB;  Service: Cardiovascular;  Laterality: Left;   ARTERY EXPLORATION Left 06/14/2019   Procedure: Artery Exploration Left Upper Arm;  Surgeon: Sherren Kerns, MD;  Location: [facility] OR;  Service: Vascular;  Laterality: Left;   AV FISTULA PLACEMENT Left 12/31/2018   Procedure: ARTERIOVENOUS (AV) FISTULA CREATION VERSUS INSERTION OF ARTERIOVENOUS GRAFT LEFT ARM;  Surgeon: Sherren Kerns, MD;  Location: [facility] OR;  Service: Vascular;  Laterality: Left;   BASCILIC VEIN TRANSPOSITION Left 06/14/2019   Procedure: SECOND STAGE BASILIC VEIN TRANSPOSITION LEFT ARM;  Surgeon: Sherren Kerns, MD;  Location: [facility] OR;  Service: Vascular;  Laterality: Left;   BIOPSY   03/05/2020   Procedure: BIOPSY;  Surgeon: Shellia Cleverly, DO;  Location: [facility] ENDOSCOPY;  Service: Gastroenterology;;   COLONOSCOPY WITH PROPOFOL N/A 03/05/2020   Procedure: COLONOSCOPY WITH PROPOFOL;  Surgeon: Shellia Cleverly, DO;  Location: [facility] ENDOSCOPY;  Service: Gastroenterology;  Laterality: N/A;   EYE SURGERY     9 total eye surgery including laser and cararact surgery   HEMOSTASIS CLIP PLACEMENT  03/05/2020   Procedure: HEMOSTASIS CLIP PLACEMENT;  Surgeon: Shellia Cleverly, DO;  Location: [facility] ENDOSCOPY;  Service: Gastroenterology;;   INSERTION OF DIALYSIS CATHETER N/A 12/31/2018   Procedure: INSERTION OF TUNNELED  DIALYSIS CATHETER;  Surgeon: Sherren Kerns, MD;  Location: [facility] OR;  Service: Vascular;  Laterality: N/A;   PERIPHERAL VASCULAR BALLOON ANGIOPLASTY Left 07/29/2019   Procedure: PERIPHERAL VASCULAR BALLOON ANGIOPLASTY;  Surgeon: Sherren Kerns, MD;  Location: [facility] INVASIVE CV LAB;  Service: Cardiovascular;  Laterality: Left;  ARM FISTULA   POLYPECTOMY  03/05/2020   Procedure: POLYPECTOMY;  Surgeon: Shellia Cleverly, DO;  Location: [facility] ENDOSCOPY;  Service: Gastroenterology;;   TEE WITHOUT CARDIOVERSION N/A 12/12/2020   Procedure: TRANSESOPHAGEAL ECHOCARDIOGRAM (TEE);  Surgeon: Wendall Stade, MD;  Location: [facility] ENDOSCOPY;  Service: Cardiovascular;  Laterality: N/A;    Assessment & Plan Clinical Impression: Patient is a 62 y.o. year old female with history of T2DM with retinopathy/nephropathy,  ESRD- HD MWF, h/o DVT 2016, PE, CN VIII palsy, severe MVR with  moderate to severe stenosis and TVR who was admitted to [facility] on 02/19/23 for MVR with valvuloplasty TV with ring insertion and removal of LV mass by Dr Felipa Furnace. Hospital course significant for post op hypotension which resolved but she developed significant orthostatic changes  treated with stress dose steriods 3/25-3/27 and Midodrine, Droxidopa and Straterra added for BP support. Angioedema managed with decadron X 24 hours and  leucocytosis treated with Cefepime 3/24-3/31. She developed diarrhea and stools negative for C diff and being managed with imodium prn. She developed complete HB and Aveira leadless RV PPM (VVI 90) placed on 03/21. She was loaded with amiodarone for A fib and converted to NSR.    She has had poor po intake as well as issues with delirium/agitation. Narcotics d/c and pain managed with tylenol and lidocaine patches. She required enteral feeds thru 03/28 and has been advanced to regular diet with thin liquids and multiple supplements. Endocrine consulted to help manage labile BS and now on regular insulin TID. Neurology consulted 03/21 due to lethargy with confusion and reports of apraxia which was felt to be due to delirium, orthostasis and medication related. Imaging deferred as patient was improving on follow up exam.    Pt reports her arms and legs are swollen- occurred since surgery/MVR.  Also intermittently SOB, esp if lays down- 2-3L O2 by [place] is also new.   Also c/o anxiety- cannot tolerate door or curtain closed.   Patient currently requires mod with mobility secondary to muscle weakness, decreased cardiorespiratoy endurance and decreased oxygen support, and decreased standing balance, decreased postural control, decreased balance strategies, and difficulty maintaining precautions.  Prior to hospitalization, patient was independent  with mobility and lived with Alone in a Apartment home.  Home access is  Level entry.  Patient will benefit from skilled PT intervention to maximize safe functional mobility, minimize fall risk, and decrease caregiver burden for planned discharge home with 24 hour supervision.  Anticipate patient will benefit from follow up HH at discharge.  PT - End of Session Activity Tolerance: Tolerates 30+ min activity with multiple rests Endurance Deficit: Yes Endurance Deficit Description: pt required multiple rest breaks due to SOB PT Assessment Rehab Potential (ACUTE/IP  ONLY): Good PT Barriers to Discharge: Decreased caregiver support;Wound Care;Hemodialysis;Weight bearing restrictions;New oxygen;Other (comments) PT Barriers to Discharge Comments: sternal precautions, supplemental O2, fear/anxiety, lives alone PT Patient demonstrates impairments in the following area(s): Balance;Behavior;Edema;Endurance;Motor;Nutrition;Perception;Safety;Skin Integrity PT Transfers Functional Problem(s): Bed Mobility;Bed to Chair;Car;Furniture PT Locomotion Functional Problem(s): Ambulation;Wheelchair Mobility;Stairs PT Plan PT Intensity: Minimum of 1-2 x/day ,45 to 90 minutes PT Frequency: 5 out of 7 days PT Duration Estimated Length of Stay: 2.5 weeks PT Treatment/Interventions: Ambulation/gait training;Discharge planning;Functional mobility training;Psychosocial support;Therapeutic Activities;Balance/vestibular training;Disease management/prevention;Neuromuscular re-education;Skin care/wound management;Therapeutic Exercise;Wheelchair propulsion/positioning;Cognitive remediation/compensation;DME/adaptive equipment instruction;Pain management;Community reintegration;Patient/family education;Splinting/orthotics;UE/LE Strength taining/ROM;Stair training;UE/LE Coordination activities PT Transfers Anticipated Outcome(s): supervision with LRAD PT Locomotion Anticipated Outcome(s): supervision with LRAD PT Recommendation Recommendations for Other Services: Neuropsych consult Follow Up Recommendations: Home health PT Patient destination: Home Equipment Recommended: To be determined Equipment Details: has none   PT Evaluation Precautions/Restrictions Precautions Precautions: Sternal;Fall;ICD/Pacemaker Precaution Comments: Anxious. Monitor (O2 > 90%), and HR Restrictions Weight Bearing Restrictions: No Other Position/Activity Restrictions: sternal precautions Pain Interference Pain Interference Pain Effect on Sleep: 0. Does not apply - I have not had any pain or hurting in the  past 5 days Pain Interference with Therapy Activities: 0. Does not apply - I have not received rehabilitationtherapy in the past 5 days Pain Interference with Day-to-Day  Activities: 1. Rarely or not at all Home Living/Prior Functioning Home Living Available Help at Discharge: Family;Available 24 hours/day (pt has "friend" and "friend's" sister to assist 24/7 in her home at discharge) Type of Home: Apartment Home Access: Level entry Home Layout: One level Bathroom Shower/Tub: Engineer, manufacturing systems: Standard Bathroom Accessibility: Yes Additional Comments: was very active 6 to 9 months ago but developed SOB and fatigue; was cycling on stationary bike in the past  Lives With: Alone Prior Function Level of Independence: Independent with basic ADLs;Independent with transfers;Independent with gait;Independent with homemaking with ambulation  Able to Take Stairs?: No Driving: Yes Vision/Perception  Vision - History Ability to See in Adequate Light: 1 Impaired Vision - Assessment Eye Alignment: Within Functional Limits Ocular Range of Motion: Within Functional Limits Alignment/Gaze Preference: Within Defined Limits Tracking/Visual Pursuits: Impaired - to be further tested in functional context Saccades: Impaired - to be further tested in functional context Convergence: Impaired (comment) Perception Perception: Within Functional Limits Praxis Praxis: Intact  Cognition Overall Cognitive Status: Within Functional Limits for tasks assessed Arousal/Alertness: Awake/alert Orientation Level: Oriented X4 Memory: Appears intact Awareness: Impaired Problem Solving: Impaired Safety/Judgment: Impaired Comments: required cues to maintain sternal precautions Sensation Sensation Light Touch: Appears Intact Hot/Cold: Not tested Proprioception: Appears Intact Stereognosis: Not tested Coordination Gross Motor Movements are Fluid and Coordinated: Yes (generalized  weakness/deconditioning) Fine Motor Movements are Fluid and Coordinated: No Finger Nose Finger Test: slower RUE>LUE Heel Shin Test: decreased ROM bilaterally with report that legs feel "heavy" Motor  Motor Motor: Within Functional Limits Motor - Skilled Clinical Observations: generalized weakness/deconditioning  Trunk/Postural Assessment  Cervical Assessment Cervical Assessment: Exceptions to [facility] (forward head) Thoracic Assessment Thoracic Assessment: Exceptions to [facility] (thoracic rounding) Lumbar Assessment Lumbar Assessment: Exceptions to [facility] (posterior pelvic tilt) Postural Control Postural Control: Within Functional Limits  Balance Balance Balance Assessed: Yes Static Standing Balance Static Standing - Balance Support: Bilateral upper extremity supported;During functional activity (RW) Static Standing - Level of Assistance: 4: Min assist Extremity Assessment  RLE Assessment RLE Assessment: Exceptions to [facility] General Strength Comments: tested sitting in WC RLE Strength Right Hip Flexion: 3+/5 Right Hip ABduction: 4-/5 Right Hip ADduction: 3+/5 Right Knee Flexion: 3+/5 Right Knee Extension: 3+/5 Right Ankle Dorsiflexion: 3+/5 Right Ankle Plantar Flexion: 4-/5 LLE Assessment LLE Assessment: Exceptions to [facility] General Strength Comments: tested sitting in WC LLE Strength Left Hip Flexion: 3+/5 Left Hip ABduction: 4-/5 Left Hip ADduction: 3+/5 Left Knee Flexion: 3+/5 Left Knee Extension: 3+/5 Left Ankle Dorsiflexion: 3+/5 Left Ankle Plantar Flexion: 4-/5  Care Tool Care Tool Bed Mobility Roll left and right activity        Sit to lying activity        Lying to sitting on side of bed activity         Care Tool Transfers Sit to stand transfer   Sit to stand assist level: Maximal Assistance - Patient 25 - 49%    Chair/bed transfer Chair/bed transfer activity did not occur: Safety/medical concerns (weakness, fear)       Toilet transfer   Assist Level: 2  Helpers (SB transfer)    Licensed conveyancer transfer activity did not occur: Safety/medical concerns (fatigue, fear, generalized weakness)        Care Tool Locomotion Ambulation Ambulation activity did not occur: Safety/medical concerns (fatigue, fear, generalized weakness)        Walk 10 feet activity Walk 10 feet activity did not occur: Safety/medical concerns (fatigue, fear, generalized weakness)  Walk 50 feet with 2 turns activity Walk 50 feet with 2 turns activity did not occur: Safety/medical concerns (fatigue, fear, generalized weakness)      Walk 150 feet activity Walk 150 feet activity did not occur: Safety/medical concerns (fatigue, fear, generalized weakness)      Walk 10 feet on uneven surfaces activity Walk 10 feet on uneven surfaces activity did not occur: Safety/medical concerns (fatigue, fear, generalized weakness)      Stairs Stair activity did not occur: Safety/medical concerns (fatigue, fear, generalized weakness)        Walk up/down 1 step activity Walk up/down 1 step or curb (drop down) activity did not occur: Safety/medical concerns (fatigue, fear, generalized weakness)      Walk up/down 4 steps activity Walk up/down 4 steps activity did not occur: Safety/medical concerns (fatigue, fear, generalized weakness)      Walk up/down 12 steps activity Walk up/down 12 steps activity did not occur: Safety/medical concerns (fatigue, fear, generalized weakness)      Pick up small objects from floor Pick up small object from the floor (from standing position) activity did not occur: Safety/medical concerns (fatigue, fear, generalized weakness)      Wheelchair Is the patient using a wheelchair?: Yes Type of Wheelchair: Manual Wheelchair activity did not occur: Safety/medical concerns (fatigue, fear, generalized weakness)      Wheel 50 feet with 2 turns activity Wheelchair 50 feet with 2 turns activity did not occur: Safety/medical concerns (fatigue, fear,  generalized weakness)    Wheel 150 feet activity Wheelchair 150 feet activity did not occur: Safety/medical concerns (fatigue, fear, generalized weakness)      Refer to Care Plan for Long Term Goals  SHORT TERM GOAL WEEK 1 PT Short Term Goal 1 (Week 1): pt will transfer sit<>stand with LRAD and min A PT Short Term Goal 2 (Week 1): pt will transfer bed<>chair with LRAD and min A PT Short Term Goal 3 (Week 1): pt will ambulate 71ft with LRAD and min A  Recommendations for other services: Neuropsych  Skilled Therapeutic Intervention Evaluation completed (see details above and below) with education on PT POC and goals and individual treatment initiated with focus on functional mobility/transfers, generalized strengthening and endurance, and dynamic standing balance/coordination. Received pt sitting in WC on [place] cushion. Pt on 2.5L O2 via [place] with SPO2 85% increasing to 93% with pursed lip breathing. Pt educated on PT evaluation, CIR policies, and therapy schedule and agreeable. Pt denied any pain during session. Dialysis PA arrived for brief assessment during session. Pt stood from [facility] with RW and max A with cues to push up with LEs to maintain sternal precautions, however pt prefers to lightly place BUE support on RW for balance but did not appear to be pushing or putting any weight through UEs. Pt only able to remain standing ~10 seconds prior to requesting to sit. Smelled odor and pt reported constant feeling like she needs to poop whenever she stands. Pt performed x 3 additional stands from [facility] with RW and mod A and while standing removed soiled brief, performed posterior pericare, donned clean brief, and pulled clean pants over hips. Pt dependent for clothing management while standing and required multiple seated rest breaks and became anxious when standing for too long. Pt instructed in seated LAQ 2x10 bilaterally, hip flexion 2x10 bilaterally, and hip adduction pillow squeezes 2x10 with 5 second  isometric hold with emphasis on LE strength/ROM. Concluded session with pt sitting in WC, needs within reach, and seatbelt alarm  on. Pt left on 2.5L O2 via [place] with SPO2 95%. Safety plan updated.   Mobility Bed Mobility Bed Mobility: Rolling Right;Rolling Left;Left Sidelying to Sit Rolling Right: Minimal Assistance - Patient > 75% Rolling Left: Minimal Assistance - Patient > 75% Left Sidelying to Sit: 2 Helpers Transfers Transfers: Stand to Sit Stand to Sit: Maximal Assistance - Patient 25-49% Lateral/Scoot Transfers: 2 Helpers (slideboard) Transfer (Assistive device): Biomedical engineer Ambulation: No Gait Gait: No Stairs / Additional Locomotion Stairs: No Wheelchair Mobility Wheelchair Mobility: No   Discharge Criteria: Patient will be discharged from PT if patient refuses treatment 3 consecutive times without medical reason, if treatment goals not met, if there is a change in medical status, if patient makes no progress towards goals or if patient is discharged from hospital.  The above assessment, treatment plan, treatment alternatives and goals were discussed and mutually agreed upon: by patient and by family  Alfonso Patten Evaluation completed (see details above and below) with education on PT POC and goals and individual treatment initiated with focus on  03/25/2023, 12:18 PM

## 2023-03-25 NOTE — Progress Notes (Signed)
ANTICOAGULATION CONSULT NOTE   Pharmacy Consult for Heparin IV drip bridging to Warfarin , INR goal 2.5-3.5  Indication: Mechanical MVR  Allergies  Allergen Reactions   Farxiga [Dapagliflozin] Other (See Comments)    Cannot take due to kidneys   Hydrocodone Other (See Comments)    "Increase Glucose level," per pt   Kombiglyze Xr [Saxagliptin-Metformin Er] Other (See Comments)    Cannot take due to kidneys    Patient Measurements: Height: 5' 3.86" (162.2 cm) (from [facility] records on 02/18/23) Weight: 75.1 kg (165 lb 9.1 oz) (from [facility] record  03/24/23.) IBW/kg (Calculated) : 54.37 Heparin Dosing Weight: 70.1 kg   Vital Signs: Temp: 98.8 F (37.1 C) (04/16 1930) Temp Source: Axillary (04/16 1930) BP: 90/48 (04/16 1930) Pulse Rate: 88 (04/16 1444)  Labs: Recent Labs    03/24/23 1547 03/25/23 0030  HGB  --  9.4*  HCT  --  31.1*  PLT  --  209  LABPROT 24.6* 25.6*  INR 2.2* 2.4*  HEPARINUNFRC <0.10* 0.10*     CrCl cannot be calculated (Patient's most recent lab result is older than the maximum 21 days allowed.).   Medical History: Past Medical History:  Diagnosis Date   Anemia    Diabetes    type II   DVT (deep venous thrombosis)    E-coli UTI    ESRD on hemodialysis    MWF at [facility]   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    Gout 06/05/2018   History of blood transfusion    History of claustrophobia    HLD (hyperlipidemia) 06/05/2018   Hypertension    PE (pulmonary embolism)    Pulled muscle    pt has right sided facial droop from pulled muscle in face since birth   Retinopathy    Was going blind and had surgery with improvement    Medications:  Medications Prior to Admission  Medication Sig Dispense Refill Last Dose   insulin aspart protamine - aspart (NOVOLOG MIX 70/30 FLEXPEN) (70-30) 100 UNIT/ML FlexPen Inject 5-10 Units into the skin with breakfast, with lunch, and with evening meal. (Patient not taking: Reported on 03/24/2023)   Not Taking   Insulin  Glargine (BASAGLAR KWIKPEN) 100 UNIT/ML Inject 5 Units into the skin at bedtime. (Patient not taking: Reported on 03/24/2023)   Not Taking   lanthanum (FOSRENOL) 1000 MG chewable tablet Chew 1,000 mg by mouth 3 (three) times daily with meals. (Patient not taking: Reported on 03/24/2023)   Not Taking    Assessment: Admit 03/24/23, patient admitted to [facility] , from [facility] (3/14-4/16/24) s/pMVR with valvuloplasty TV with ring insertion, AV decalcification with removal of LV mass 02/19/23. Then developed CHB and afib post-op and on 3/21 underwent leadless PPM placement.   Pharmacy consult for IV heparin and Warfarin , goal INR 2.5-3.5 for mechantcal MVR.   She also has a history of DVT/PE. 2016, 2019)  Patient reports she was not taking any anticoagulation prior to the [facility] admission, and it has been years since she has taken Eliquis.   At [facility] she was started on Warfarin .  She received Heparin IV 1300 units/hr 4/13-4/15 then hehparin drip was discontinued on  4/15 (PTT was 51.3 sec on 4/15) , when INR 1.8.  Today the INR = 2.3 (4/16 AM) at [facility].  Goal INR 2.5-3.5 INR drawn  admit 4/16 = 2.2 She received  Warfarin   , , , , 3.5mg  ,3.5mg   from 4/10 -03/23/23 at [facility],   Warfarin started before  this date but unclear in records when warfarin started.  Daily HL, CBC and INR  Initial heparin level: 0.10 on 1100 units/hr; no issues with infusion or overt s/sx of bleeding reported by RN. INR up to 2.4 this morning but still below goal.    Goal of Therapy:  INR 2.5-3.5 Heparin level 0.3-0.7 units/ml    Plan:  Increase heparin infusion to 1300 units/hr  Check heparin level in 8 hours Daily HL, CBC and INR.  Plan to discontinue IV heparin infusion when INR  =/>2.5.    Ruben Im, PharmD Clinical Pharmacist 03/25/2023 2:10 AM Please check AMION for all [facility] Pharmacy numbers

## 2023-03-25 NOTE — Evaluation (Signed)
Occupational Therapy Assessment and Plan  Patient Details  Name: Colleen [name] MRN: [id number] Date of Birth: 25-Apr-1961  OT Diagnosis: abnormal posture, acute pain, apraxia, cognitive deficits, disturbance of vision, muscle weakness (generalized), swelling of limb, and sternal region pain Rehab Potential: Rehab Potential (ACUTE ONLY): Fair ELOS: 2-3 weeks   Today's Date: 03/25/2023 OT Individual Time: 9562-1308 OT Individual Time Calculation (min): 75 min     Hospital Problem: Principal Problem:   Debility Active Problems:   ESRD on dialysis   Pressure injury of skin   S/P MVR (mitral valve repair)   Past Medical History:  Past Medical History:  Diagnosis Date   Anemia    Diabetes    type II   DVT (deep venous thrombosis)    E-coli UTI    ESRD on hemodialysis    MWF at [facility]   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    Gout 06/05/2018   History of blood transfusion    History of claustrophobia    HLD (hyperlipidemia) 06/05/2018   Hypertension    PE (pulmonary embolism)    Pulled muscle    pt has right sided facial droop from pulled muscle in face since birth   Retinopathy    Was going blind and had surgery with improvement   Past Surgical History:  Past Surgical History:  Procedure Laterality Date   A/V FISTULAGRAM Left 07/29/2019   Procedure: A/V FISTULAGRAM;  Surgeon: Sherren Kerns, MD;  Location: [facility] INVASIVE CV LAB;  Service: Cardiovascular;  Laterality: Left;   ARTERY EXPLORATION Left 06/14/2019   Procedure: Artery Exploration Left Upper Arm;  Surgeon: Sherren Kerns, MD;  Location: [facility] OR;  Service: Vascular;  Laterality: Left;   AV FISTULA PLACEMENT Left 12/31/2018   Procedure: ARTERIOVENOUS (AV) FISTULA CREATION VERSUS INSERTION OF ARTERIOVENOUS GRAFT LEFT ARM;  Surgeon: Sherren Kerns, MD;  Location: [facility] OR;  Service: Vascular;  Laterality: Left;   BASCILIC VEIN TRANSPOSITION Left 06/14/2019   Procedure: SECOND STAGE BASILIC VEIN TRANSPOSITION LEFT ARM;   Surgeon: Sherren Kerns, MD;  Location: [facility] OR;  Service: Vascular;  Laterality: Left;   BIOPSY  03/05/2020   Procedure: BIOPSY;  Surgeon: Shellia Cleverly, DO;  Location: [facility] ENDOSCOPY;  Service: Gastroenterology;;   COLONOSCOPY WITH PROPOFOL N/A 03/05/2020   Procedure: COLONOSCOPY WITH PROPOFOL;  Surgeon: Shellia Cleverly, DO;  Location: [facility] ENDOSCOPY;  Service: Gastroenterology;  Laterality: N/A;   EYE SURGERY     9 total eye surgery including laser and cararact surgery   HEMOSTASIS CLIP PLACEMENT  03/05/2020   Procedure: HEMOSTASIS CLIP PLACEMENT;  Surgeon: Shellia Cleverly, DO;  Location: [facility] ENDOSCOPY;  Service: Gastroenterology;;   INSERTION OF DIALYSIS CATHETER N/A 12/31/2018   Procedure: INSERTION OF TUNNELED  DIALYSIS CATHETER;  Surgeon: Sherren Kerns, MD;  Location: [facility] OR;  Service: Vascular;  Laterality: N/A;   PERIPHERAL VASCULAR BALLOON ANGIOPLASTY Left 07/29/2019   Procedure: PERIPHERAL VASCULAR BALLOON ANGIOPLASTY;  Surgeon: Sherren Kerns, MD;  [facility]: [facility] LAB;  Service: Cardiovascular;  Laterality: Left;  ARM FISTULA   POLYPECTOMY  03/05/2020   Procedure: POLYPECTOMY;  Surgeon: Shellia Cleverly, DO;  Location: [facility] ENDOSCOPY;  Service: Gastroenterology;;   TEE WITHOUT CARDIOVERSION N/A 12/12/2020   Procedure: TRANSESOPHAGEAL ECHOCARDIOGRAM (TEE);  Surgeon: Wendall Stade, MD;  Location: [facility] ENDOSCOPY;  Service: Cardiovascular;  Laterality: N/A;    Assessment & Plan Clinical Impression:  Colleen [name]. Colleen [name] is a 62 year old R handed female with history  of T2DM with retinopathy/nephropathy,  ESRD- HD MWF, h/o DVT 2016, PE, CN VIII palsy, severe MVR with moderate to severe stenosis and TVR who was admitted to [facility] on 02/19/23 for MVR with valvuloplasty TV with ring insertion and removal of LV mass by Dr Felipa Furnace. Hospital course significant for post op hypotension which resolved but she developed significant orthostatic changes  treated with stress dose steriods  3/25-3/27 and Midodrine, Droxidopa and Straterra added for BP support. Angioedema managed with decadron X 24 hours and leucocytosis treated with Cefepime 3/24-3/31. She developed diarrhea and stools negative for C diff and being managed with imodium prn. She developed complete HB and Aveira leadless RV PPM (VVI 90) placed on 03/21. She was loaded with amiodarone for A fib and converted to NSR.    She has had poor po intake as well as issues with delirium/agitation. Narcotics d/c and pain managed with tylenol and lidocaine patches. She required enteral feeds thru 03/28 and has been advanced to regular diet with thin liquids and multiple supplements. Endocrine consulted to help manage labile BS and now on regular insulin TID. Neurology consulted 03/21 due to lethargy with confusion and reports of apraxia which was felt to be due to delirium, orthostasis and medication related. Imaging deferred as patient was improving on follow up exam.    Pt reports her arms and legs are swollen- occurred since surgery/MVR.  Also intermittently SOB, esp if lays down- 2-3L O2 by [place] is also new.   Also c/o anxiety- cannot tolerate door or curtain closed.    LBM this AM- denies constipation- is Anuric- doesn't form ANY urine anymore.  Denies pain- "none since surgery' per pt. Patient transferred to CIR on 03/24/2023 .    Patient currently requires total with basic self-care skills secondary to muscle weakness, decreased cardiorespiratoy endurance and decreased oxygen support, motor apraxia, decreased coordination, and decreased motor planning, decreased visual acuity, decreased initiation, decreased awareness, decreased problem solving, and decreased memory, and decreased sitting balance, decreased standing balance, and difficulty maintaining precautions.  Prior to hospitalization, patient could complete all self-care independently.  Patient will benefit from skilled intervention to decrease level of assist with basic  self-care skills and increase independence with basic self-care skills prior to discharge home with care partner.  Anticipate patient will require 24 hour supervision and minimal physical assistance and follow up home health.  OT - End of Session Activity Tolerance: Tolerates < 10 min activity with changes in vital signs Endurance Deficit: Yes Endurance Deficit Description: prolonged rest breaks needed with desat in O2 OT Assessment Rehab Potential (ACUTE ONLY): Fair OT Barriers to Discharge: Lack of/limited family support;Decreased caregiver support;Home environment access/layout;Incontinence;Hemodialysis;New oxygen OT Patient demonstrates impairments in the following area(s): Balance;Cognition;Edema;Endurance;Motor;Pain;Safety;Vision;Skin Integrity;Sensory OT Basic ADL's Functional Problem(s): Grooming;Bathing;Dressing;Toileting OT Transfers Functional Problem(s): Toilet;Tub/Shower OT Additional Impairment(s): None OT Plan OT Intensity: Minimum of 1-2 x/day, 45 to 90 minutes OT Frequency: 5 out of 7 days OT Duration/Estimated Length of Stay: 2-3 weeks OT Treatment/Interventions: Balance/vestibular training;DME/adaptive equipment instruction;Patient/family education;Therapeutic Activities;Wheelchair propulsion/positioning;Cognitive remediation/compensation;Psychosocial support;Therapeutic Exercise;Functional mobility training;Self Care/advanced ADL retraining;UE/LE Strength taining/ROM;Discharge planning;Neuromuscular re-education;Skin care/wound managment;UE/LE Coordination activities;Disease mangement/prevention;Pain management;Visual/perceptual remediation/compensation OT Self Feeding Anticipated Outcome(s): Mod I OT Basic Self-Care Anticipated Outcome(s): Min A OT Toileting Anticipated Outcome(s): Min A OT Bathroom Transfers Anticipated Outcome(s): CGA OT Recommendation Recommendations for Other Services: Neuropsych consult;Therapeutic Recreation consult Therapeutic Recreation  Interventions: Pet therapy Patient destination: Home Follow Up Recommendations: Home health OT;24 hour supervision/assistance Equipment Recommended: To be determined   OT Evaluation Precautions/Restrictions  Precautions Precautions: Sternal;Fall;ICD/Pacemaker Precaution Comments: Anxious. Monitor (O2 > 90%), and HR Restrictions Weight Bearing Restrictions: No Other Position/Activity Restrictions: sternal precautions Home Living/Prior Functioning Home Living Family/patient expects to be discharged to:: Private residence Living Arrangements: Non-relatives/Friends Available Help at Discharge: Family, Available 24 hours/day (boyfriend's sister to assist 24/7 in her home at discharge) Type of Home: Apartment Home Access: Level entry Home Layout: One level Bathroom Shower/Tub: Armed forces operational officer Accessibility: Yes Additional Comments: was very active prior to 6 to 9 months ago but developed SOB and fatigue; was cycling on stationary bike in the past  Lives With: Alone IADL History Homemaking Responsibilities: Yes Meal Prep Responsibility: Primary Current License: Yes Mode of Transportation: Car Occupation: Unemployed Type of Occupation: worked as Merchandiser, 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 Prior Function Level of Independence: Independent with basic ADLs, Independent with transfers, Independent with gait, Independent with homemaking with ambulation  Able to Take Stairs?: Yes Driving: Yes Vision Baseline Vision/History: 1 Wears glasses (reports needing them intermittently however doesn't have with her) Ability to See in Adequate Light: 1 Impaired Patient Visual Report: Blurring of vision;Other (comment) (reports only seeing light vs contrast and has had "9 eye surgeries") Vision Assessment?: Yes Eye Alignment: Within Functional Limits Ocular Range of Motion: Within Functional Limits Alignment/Gaze Preference: Within Defined Limits Tracking/Visual Pursuits:  Impaired - to be further tested in functional context Saccades: Impaired - to be further tested in functional context Convergence: Impaired (comment) Visual Fields: No apparent deficits Perception  Perception: Within Functional Limits Praxis Praxis: Intact Cognition Cognition Arousal/Alertness: Awake/alert Orientation Level: Person;Place;Situation Person: Oriented Place: Oriented Situation: Oriented Memory: Appears intact Awareness: Impaired Problem Solving: Impaired Safety/Judgment: Impaired Brief Interview for Mental Status (BIMS) Repetition of Three Words (First Attempt): 3 Temporal Orientation: Year: Correct Temporal Orientation: Month: Accurate within 5 days Temporal Orientation: Day: Correct Recall: "Sock": Yes, no cue required Recall: "Blue": Yes, no cue required Recall: "Bed": Yes, no cue required BIMS Summary Score: 15 Sensation Sensation Light Touch: Appears Intact Hot/Cold: Not tested Proprioception: Appears Intact Stereognosis: Not tested Coordination Gross Motor Movements are Fluid and Coordinated: Yes Fine Motor Movements are Fluid and Coordinated: No Finger Nose Finger Test: slower RUE>LUE Heel Shin Test: decreased ROM bilaterally with report that legs feel "heavy" Motor  Motor Motor: Within Functional Limits Motor - Skilled Clinical Observations: generalized weakness/deconditioning  Trunk/Postural Assessment  Cervical Assessment Cervical Assessment: Exceptions to [facility] (forward head) Thoracic Assessment Thoracic Assessment: Exceptions to [facility] (thoracic rounding) Lumbar Assessment Lumbar Assessment: Exceptions to [facility] (posterior pelvic tilt) Postural Control Postural Control: Within Functional Limits  Balance Balance Balance Assessed: Yes Static Standing Balance Static Standing - Balance Support: Bilateral upper extremity supported;During functional activity (RW) Static Standing - Level of Assistance: 4: Min assist Extremity/Trunk Assessment RUE  Assessment RUE Assessment: Exceptions to [facility] Active Range of Motion (AROM) Comments: Limited to 90 degrees shoulder flexion, WFL distally General Strength Comments: 3-/5 grossly; did not formally test proximally 2/2 sternal precautions LUE Assessment LUE Assessment: Exceptions to [facility] Active Range of Motion (AROM) Comments: Limited to 90 degrees shoulder flexion, WFL distally General Strength Comments: 3-/5 grossly; did not formally test proximally 2/2 sternal precautions  Care Tool Care Tool Self Care Eating   Eating Assist Level: Set up assist    Oral Care    Oral Care Assist Level: Minimal Assistance - Patient > 75%    Bathing   Body parts bathed by patient: Right arm;Left arm;Chest;Abdomen;Right upper leg;Left upper leg;Face Body parts bathed by helper: Front perineal  area;Buttocks;Right lower leg;Left lower leg   Assist Level: Maximal Assistance - Patient 24 - 49%    Upper Body Dressing(including orthotics)   What is the patient wearing?: Pull over shirt   Assist Level: Total Assistance - Patient < 25%    Lower Body Dressing (excluding footwear)   What is the patient wearing?: Incontinence brief;Pants Assist for lower body dressing: Total Assistance - Patient < 25%    Putting on/Taking off footwear   What is the patient wearing?: Non-skid slipper socks Assist for footwear: Dependent - Patient 0%       Care Tool Toileting Toileting activity   Assist for toileting: Total Assistance - Patient < 25%     Care Tool Bed Mobility Roll left and right activity   Roll left and right assist level: Independent    Sit to lying activity   Sit to lying assist level: Minimal Assistance - Patient > 75%    Lying to sitting on side of bed activity   Lying to sitting on side of bed assist level: the ability to move from lying on the back to sitting on the side of the bed with no back support.: Minimal Assistance - Patient > 75%     Care Tool Transfers Sit to stand transfer Sit to  stand activity did not occur: Safety/medical concerns      Chair/bed transfer Chair/bed transfer activity did not occur: Safety/medical concerns       Toilet transfer Toilet transfer activity did not occur: Safety/medical concerns Assist Level: 2 Helpers (SB transfer)     Care Tool Cognition  Expression of Ideas and Wants Expression of Ideas and Wants: 4. Without difficulty (complex and basic) - expresses complex messages without difficulty and with speech that is clear and easy to understand  Understanding Verbal and Non-Verbal Content Understanding Verbal and Non-Verbal Content: 4. Understands (complex and basic) - clear comprehension without cues or repetitions   Memory/Recall Ability Memory/Recall Ability : Current season;Location of own room;Staff names and faces;That he or she is in a hospital/hospital unit   Refer to Care Plan for Long Term Goals  SHORT TERM GOAL WEEK 1 OT Short Term Goal 1 (Week 1): Pt will complete 1/3 toileting steps with CGA OT Short Term Goal 2 (Week 1): Pt will complete sit > stand in prep for ADL with max A using LRAD OT Short Term Goal 3 (Week 1): Pt will complete transfer with mod A of 1 using LRAD to promote OOB toileting  Recommendations for other services: Neuropsych and Therapeutic Recreation  Pet therapy   Skilled Therapeutic Intervention Patient received upright in bed upon therapy arrival and agreeable to participate in OT evaluation. Pt verbalized discomfort sitting in the bed, however no pain. OT offered repositioning and transfer into w/c for comfort. Education provided on OT purpose, therapy schedule, goals for therapy, and safety policy while in rehab. Patient demonstrates poor recall/adherence to sternal precautions, initiation/awareness/visual deficits, balance, endurance and general strength impairments resulting in difficulty completing BADL tasks without increased physical assist. Pt will benefit from skilled OT services to focus on  mentioned deficits. See below for ADL and functional transfer performance. Received on 2.5 L O2 via [place] with SPO2 at 90% and HR at 116 at rest, however did desat O2 to mid 80's with activity and did not rebound therefore bumped to 3L O2 with adequate rebound. Returned pt to 2.5 L prior to departure. Nurse utilized to pause IV for UB dressing, and 2nd person needed for assist  with transfers. Pt remained seated in w/c on roho cushion at conclusion of session with belt alarm on and all needs met at end of session.  ADL ADL Eating: Set up Where Assessed-Eating: Wheelchair Grooming: Minimal assistance Where Assessed-Grooming: Sitting at sink Upper Body Bathing: Supervision/safety Where Assessed-Upper Body Bathing: Bed level Lower Body Bathing: Maximal assistance Where Assessed-Lower Body Bathing: Bed level Upper Body Dressing: Maximal assistance Where Assessed-Upper Body Dressing: Bed level Lower Body Dressing: Maximal assistance Where Assessed-Lower Body Dressing: Bed level Toileting: Maximal assistance Where Assessed-Toileting: Bed level Toilet Transfer: Dependent (Max A +2) Toilet Transfer Method: Scientist, research (life sciences): Other (comment) (simulated to w/c) Tub/Shower Transfer: Unable to assess Tub/Shower Transfer Method: Unable to assess Film/video editor: Unable to assess Film/video editor Method: Unable to assess Mobility  Bed Mobility Bed Mobility: Rolling Right;Rolling Left;Left Sidelying to Sit Rolling Right: Minimal Assistance - Patient > 75% Rolling Left: Minimal Assistance - Patient > 75% Left Sidelying to Sit: 2 Helpers   Discharge Criteria: Patient will be discharged from OT if patient refuses treatment 3 consecutive times without medical reason, if treatment goals not met, if there is a change in medical status, if patient makes no progress towards goals or if patient is discharged from hospital.  The above assessment, treatment plan, treatment  alternatives and goals were discussed and mutually agreed upon: by patient  Melvyn Novas, MS, OTR/L  03/25/2023, 12:02 PM

## 2023-03-25 NOTE — Progress Notes (Signed)
Patient in RED MEWS at 1800 due to low BP, resp rate, and pulse. Green upon arrival from [facility] this evening to the floor at [employer]. Vitals for the 2000 hours obtained BP continues to be low and pulse elevated. Patient transitioning to YELLOW MEWS. Rechecked vitals and BP manually. BP remained low with an elevated apical pulse. Charge RN Dois Davenport, [place]. Informed of patient's vitals. 2110 on call provider called. Vitals rechecked at 2145. On call provider contacted again and updated about vitals an blood pressure trending upwards. Pulse is decreasing as well. At 2222 Dr. Kathrene Bongo called an explained plan to place 200 CC Bolus order PRN if blood pressure trends low again.

## 2023-03-25 NOTE — Progress Notes (Signed)
Was called for post hd hypotension in this patient.  With hd she had 2400 removed.  I was planning on giving a 200 cc bolus but last BP was trending better.  Pt feels poorly but not possibly worse than after dialysis in the normal setting. I have spoken to nurse Riley Lam- will put in order for 200 cc of NS bolus if BP goes down again and stays in the 80's   Carita Sollars [employer]

## 2023-03-25 NOTE — Progress Notes (Signed)
ANTICOAGULATION CONSULT NOTE   Pharmacy Consult for Heparin IV drip bridging to Warfarin , INR goal 2.5-3.5  Indication: Mechanical MVR  Allergies  Allergen Reactions   Farxiga [Dapagliflozin] Other (See Comments)    Cannot take due to kidneys   Hydrocodone Other (See Comments)    "Increase Glucose level," per pt   Kombiglyze Xr [Saxagliptin-Metformin Er] Other (See Comments)    Cannot take due to kidneys    Patient Measurements: Height: 5' 3.86" (162.2 cm) (from [facility] records on 02/18/23) Weight: 75.4 kg (166 lb 3.6 oz) IBW/kg (Calculated) : 54.37 Heparin Dosing Weight: 70.1 kg   Vital Signs: Temp: 99.1 F (37.3 C) (04/17 0402) Temp Source: Axillary (04/17 0402) BP: 100/55 (04/17 0402)  Labs: Recent Labs    03/24/23 1547 03/25/23 0030 03/25/23 1053  HGB  --  9.4* 9.6*  HCT  --  31.1* 31.1*  PLT  --  209 175  LABPROT 24.6* 25.6* 26.7*  INR 2.2* 2.4* 2.5*  HEPARINUNFRC <0.10* 0.10* 0.29*     CrCl cannot be calculated (Patient's most recent lab result is older than the maximum 21 days allowed.).   Medical History: Past Medical History:  Diagnosis Date   Anemia    Diabetes    type II   DVT (deep venous thrombosis)    E-coli UTI    ESRD on hemodialysis    MWF at [facility]   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    Gout 06/05/2018   History of blood transfusion    History of claustrophobia    HLD (hyperlipidemia) 06/05/2018   Hypertension    PE (pulmonary embolism)    Pulled muscle    pt has right sided facial droop from pulled muscle in face since birth   Retinopathy    Was going blind and had surgery with improvement      Assessment: Admit 03/24/23, patient admitted to [facility] , from [facility] (3/14-4/16/24) s/pMVR with valvuloplasty TV with ring insertion, AV decalcification with removal of LV mass 02/19/23. Then developed CHB and afib post-op and on 3/21 underwent leadless PPM placement.   Pharmacy consult for IV heparin and Warfarin , goal INR 2.5-3.5 for  mechantcal MVR.   She also has a history of DVT/PE. 2016, 2019)  Patient reports she was not taking any anticoagulation prior to the [facility] admission, and it has been years since she has taken Eliquis.   At [facility] she was started on Warfarin .  She received Heparin IV 1300 units/hr 4/13-4/15 then hehparin drip was discontinued on  4/15 (PTT was 51.3 sec on 4/15) @[facility] , when INR 1.8.  Today the INR = 2.3 (4/16 AM) at [facility].  Goal INR 2.5-3.5 INR drawn @[facility]  admit 4/16 = 2.2 She received  Warfarin  2mg  , 2mg , 3mg , 3mg , 3.5mg  ,3.5mg   from 4/10 -03/23/23 at [facility],   Warfarin started before this date but unclear in records when warfarin started.     Next heparin level is 0.29 after Heparin increased to 1300 units/hr.  INR is 2.5 now , Goal 2.5-3.5.   No bleeding reported.  Hgb stable at 9.6 , pltc 175k stable.  Okay to discontinue heparin IV bridge as INR is within goal.    Most recent INR trend from [facility]  4/14 to today 4/16:  1.7-1.8-2.3 (repeat 2.2)- 2.4 this AM/ 2.5 at 10:53.   Most recent warfarin doses at  [facility] records had received warfarin 2mg , 3mg , 3mg , 3.5mg , 3.5 . Gave 3.75mg   at [facility] 4/16.  On amiodarone @ [facility], continues here.  Potential drug -drug interaction with warfarin to increase warfarin effect.   Goal of Therapy:  INR 2.5-3.5    Plan:  Discontinue IV heparin drip as INR is 2.5 today.  Give Warfarin 4 mg today Daily  INR, CBC  Thank you for allowing pharmacy to be part of this patients care team.   Noah Delaine, RPh Clinical Pharmacist 03/25/2023 12:22 PM Please check AMION for all [facility] Pharmacy numbers

## 2023-03-25 NOTE — Progress Notes (Signed)
   03/25/23 1830  Vitals  Pulse Rate (!) 102  Resp (!) 28  BP (!) 94/52  SpO2 96 %  O2 Device Nasal Cannula  Weight  (unable to weigh)  Type of Weight Post-Dialysis  Oxygen Therapy  O2 Flow Rate (L/min) 3 L/min  Post Treatment  Dialyzer Clearance Lightly streaked  Duration of HD Treatment -hour(s) 3 hour(s)  Hemodialysis Intake (mL) 200 mL  Liters Processed 72  Fluid Removed (mL) 2400 mL  Tolerated HD Treatment Yes  AVG/AVF Arterial Site Held (minutes) 10 minutes  AVG/AVF Venous Site Held (minutes) 10 minutes   Received patient in bed to unit.  Alert and oriented.  Informed consent signed and in chart.   TX duration:3.0---pt refused 3.5  Patient tolerated well.  Transported back to the room  Alert, without acute distress.  Hand-off given to patient's nurse.   Access used:LUAF Access issues: no complications no complications Total UF removed: 2400 Medication(s) given: none    [facility]

## 2023-03-26 DIAGNOSIS — R5381 Other malaise: Secondary | ICD-10-CM | POA: Diagnosis not present

## 2023-03-26 LAB — CBC
HCT: 32.9 % — ABNORMAL LOW (ref 36.0–46.0)
Hemoglobin: 9.9 g/dL — ABNORMAL LOW (ref 12.0–15.0)
MCH: 29 pg (ref 26.0–34.0)
MCHC: 30.1 g/dL (ref 30.0–36.0)
MCV: 96.5 fL (ref 80.0–100.0)
Platelets: 194 10*3/uL (ref 150–400)
RBC: 3.41 MIL/uL — ABNORMAL LOW (ref 3.87–5.11)
RDW: 18.6 % — ABNORMAL HIGH (ref 11.5–15.5)
WBC: 7.8 10*3/uL (ref 4.0–10.5)
nRBC: 0.6 % — ABNORMAL HIGH (ref 0.0–0.2)

## 2023-03-26 LAB — RENAL FUNCTION PANEL
Albumin: 2.2 g/dL — ABNORMAL LOW (ref 3.5–5.0)
Anion gap: 14 (ref 5–15)
BUN: 28 mg/dL — ABNORMAL HIGH (ref 6–20)
CO2: 26 mmol/L (ref 22–32)
Calcium: 7.8 mg/dL — ABNORMAL LOW (ref 8.9–10.3)
Chloride: 91 mmol/L — ABNORMAL LOW (ref 98–111)
Creatinine, Ser: 4.22 mg/dL — ABNORMAL HIGH (ref 0.44–1.00)
GFR, Estimated: 11 mL/min — ABNORMAL LOW (ref >60)
Glucose, Bld: 216 mg/dL — ABNORMAL HIGH (ref 70–99)
Phosphorus: 4.1 mg/dL (ref 2.5–4.6)
Potassium: 4.1 mmol/L (ref 3.5–5.1)
Sodium: 131 mmol/L — ABNORMAL LOW (ref 135–145)

## 2023-03-26 LAB — PROTIME-INR
INR: 2.9 — ABNORMAL HIGH (ref 0.8–1.2)
Prothrombin Time: 29.8 seconds — ABNORMAL HIGH (ref 11.4–15.2)

## 2023-03-26 LAB — GLUCOSE, CAPILLARY
Glucose-Capillary: 192 mg/dL — ABNORMAL HIGH (ref 70–99)
Glucose-Capillary: 343 mg/dL — ABNORMAL HIGH (ref 70–99)
Glucose-Capillary: 195 mg/dL — ABNORMAL HIGH (ref 70–99)
Glucose-Capillary: 70 mg/dL (ref 70–99)
Glucose-Capillary: 82 mg/dL (ref 70–99)

## 2023-03-26 MED ORDER — METOPROLOL SUCCINATE ER 25 MG PO TB24: 12.5000 mg | ORAL_TABLET | Freq: Every day | ORAL | Status: DC

## 2023-03-26 MED ORDER — MELATONIN 5 MG PO TABS
10.0000 mg | ORAL_TABLET | Freq: Every day | ORAL | Status: DC
  Administered 2023-03-26 – 2023-04-08 (×14): 10 mg via ORAL
  Filled 2023-03-26 (×15): qty 2

## 2023-03-26 MED ORDER — WARFARIN SODIUM 3 MG PO TABS
3.0000 mg | ORAL_TABLET | Freq: Every day | ORAL | Status: DC
  Administered 2023-03-26: 3 mg via ORAL
  Filled 2023-03-26: qty 1

## 2023-03-26 NOTE — Progress Notes (Signed)
ANTICOAGULATION CONSULT NOTE   Pharmacy Consult for Warfarin , INR goal 2.5-3.5  Indication: Mechanical MVR  Allergies  Allergen Reactions   Farxiga [Dapagliflozin] Other (See Comments)    Cannot take due to kidneys   Hydrocodone Other (See Comments)    "Increase Glucose level," per pt   Kombiglyze Xr [Saxagliptin-Metformin Er] Other (See Comments)    Cannot take due to kidneys    Patient Measurements: Height: 5' 3.86" (162.2 cm) (from [facility] records on 02/18/23) Weight: 77.4 kg (170 lb 10.2 oz) IBW/kg (Calculated) : 54.37 Heparin Dosing Weight: 70.1 kg   Vital Signs: Temp: 98.7 F (37.1 C) (04/18 0955) BP: 103/61 (04/18 0955) Pulse Rate: 112 (04/18 0955)  Labs: Recent Labs    03/24/23 1547 03/24/23 1547 03/25/23 0030 03/25/23 1053 03/26/23 0557  HGB  --    < > 9.4* 9.6* 9.9*  HCT  --   --  31.1* 31.1* 32.9*  PLT  --   --  209 175 194  LABPROT 24.6*  --  25.6* 26.7* 29.8*  INR 2.2*  --  2.4* 2.5* 2.9*  HEPARINUNFRC <0.10*  --  0.10* 0.29*  --   CREATININE  --   --   --   --  4.22*   < > = values in this interval not displayed.     Estimated Creatinine Clearance: 14.2 mL/min (A) (by C-G formula based on SCr of 4.22 mg/dL (H)).   Medical History: Past Medical History:  Diagnosis Date   Anemia    Diabetes    type II   DVT (deep venous thrombosis)    E-coli UTI    ESRD on hemodialysis    MWF at [facility]   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    Gout 06/05/2018   History of blood transfusion    History of claustrophobia    HLD (hyperlipidemia) 06/05/2018   Hypertension    PE (pulmonary embolism)    Pulled muscle    pt has right sided facial droop from pulled muscle in face since birth   Retinopathy    Was going blind and had surgery with improvement      Assessment: Admit 03/24/23, patient admitted to [facility] , from [facility] (3/14-4/16/24) s/pMVR with valvuloplasty TV with ring insertion, AV decalcification with removal of LV mass 02/19/23. Then developed CHB and  afib post-op and on 3/21 underwent leadless PPM placement.   Pharmacy consult for IV heparin and Warfarin , goal INR 2.5-3.5 for mechantcal MVR.   She also has a history of DVT/PE. 2016, 2019)  Patient reports she was not taking any anticoagulation prior to the [facility] admission, and it has been years since she has taken Eliquis.   At [facility] she was started on Warfarin .  She received Heparin IV 1300 units/hr 4/13-4/15 then hehparin drip was discontinued on  4/15 (PTT was 51.3 sec on 4/15) @[facility] , when INR 1.8.  Today the INR = 2.3 (4/16 AM) at [facility].  Goal INR 2.5-3.5 INR drawn @[facility]  admit 4/16 = 2.2 She received  Warfarin  2mg  , 2mg , 3mg , 3mg , 3.5mg  ,3.5mg   from 4/10 -03/23/23 at [facility],  Per [facility] records , Warfarin started ~02/21/23  s/p mechanical MVR 02/19/23.    UPDATE:  Amiodarone has potential drug-drug interaction(DDI) w/Warfarin,  to increase hypoprothrombinemic effect of Warfarin. Monitoring this by protime Danae Orleans labs.  Per [facility]  records Amiodarone  started  ~3/21 w/load, Amio 200mg  QD since 4/10 and Warfarin started ~02/21/23 at [facility]), thus not a new /additional  med.   [facility]  records reviewed, found on warfarin 2.5mg  daily as of 02/21/23 and most recent doses given at [facility]  prior to [facility] admit (4/16) were  , , , , 3.5mg  ,3.5mg   from 4/10 -03/23/23.    Most recent INR trend at [facility]  4/14-4/16/24: INR 1.7-1.8-2.3 / repeat 2.2.  INR  2.4/ repeat 2.5 on 4/17 and today 4/18 INR is 2.9, therapeutic . Goal INR = 2.5-3.5.    Heparin drip discontinued on 4/17 due to therapeutic INR.    Goal of Therapy:  INR 2.5-3.5    Plan:  Give Warfarin 3 mg DAILY.  Daily  INR, CBC Continue to monitor for potential drug -drug interaction with amiodarone and warfarin.   Thank you for allowing pharmacy to be part of this patients care team.   Noah Delaine, RPh Clinical Pharmacist 03/26/2023 2:13 PM Please check AMION for all [facility] Pharmacy numbers

## 2023-03-26 NOTE — Progress Notes (Signed)
Inpatient Rehabilitation Care Coordinator Assessment and Plan Patient Details  Name: Colleen [name] MRN: [id number] Date of Birth: 1961/03/03  Today's Date: 03/26/2023  Hospital Problems: Principal Problem:   Debility Active Problems:   ESRD on dialysis   Pressure injury of skin   S/P MVR (mitral valve repair)  Past Medical History:  Past Medical History:  Diagnosis Date   Anemia    Diabetes    type II   DVT (deep venous thrombosis)    E-coli UTI    ESRD on hemodialysis    MWF at [facility]   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    Gout 06/05/2018   History of blood transfusion    History of claustrophobia    HLD (hyperlipidemia) 06/05/2018   Hypertension    PE (pulmonary embolism)    Pulled muscle    pt has right sided facial droop from pulled muscle in face since birth   Retinopathy    Was going blind and had surgery with improvement   Past Surgical History:  Past Surgical History:  Procedure Laterality Date   A/V FISTULAGRAM Left 07/29/2019   Procedure: A/V FISTULAGRAM;  Surgeon: Sherren Kerns, MD;  Location: [facility] INVASIVE CV LAB;  Service: Cardiovascular;  Laterality: Left;   ARTERY EXPLORATION Left 06/14/2019   Procedure: Artery Exploration Left Upper Arm;  Surgeon: Sherren Kerns, MD;  Location: [facility] OR;  Service: Vascular;  Laterality: Left;   AV FISTULA PLACEMENT Left 12/31/2018   Procedure: ARTERIOVENOUS (AV) FISTULA CREATION VERSUS INSERTION OF ARTERIOVENOUS GRAFT LEFT ARM;  Surgeon: Sherren Kerns, MD;  Location: [facility] OR;  Service: Vascular;  Laterality: Left;   BASCILIC VEIN TRANSPOSITION Left 06/14/2019   Procedure: SECOND STAGE BASILIC VEIN TRANSPOSITION LEFT ARM;  Surgeon: Sherren Kerns, MD;  Location: [facility] OR;  Service: Vascular;  Laterality: Left;   BIOPSY  03/05/2020   Procedure: BIOPSY;  Surgeon: Shellia Cleverly, DO;  Location: [facility] ENDOSCOPY;  Service: Gastroenterology;;   COLONOSCOPY WITH PROPOFOL N/A 03/05/2020   Procedure: COLONOSCOPY WITH  PROPOFOL;  Surgeon: Shellia Cleverly, DO;  Location: [facility] ENDOSCOPY;  Service: Gastroenterology;  Laterality: N/A;   EYE SURGERY     9 total eye surgery including laser and cararact surgery   HEMOSTASIS CLIP PLACEMENT  03/05/2020   Procedure: HEMOSTASIS CLIP PLACEMENT;  Surgeon: Shellia Cleverly, DO;  Location: [facility] ENDOSCOPY;  Service: Gastroenterology;;   INSERTION OF DIALYSIS CATHETER N/A 12/31/2018   Procedure: INSERTION OF TUNNELED  DIALYSIS CATHETER;  Surgeon: Sherren Kerns, MD;  Location: [facility] OR;  Service: Vascular;  Laterality: N/A;   PERIPHERAL VASCULAR BALLOON ANGIOPLASTY Left 07/29/2019   Procedure: PERIPHERAL VASCULAR BALLOON ANGIOPLASTY;  Surgeon: Sherren Kerns, MD;  [facility]: [facility] INVASIVE CV LAB;  Service: Cardiovascular;  Laterality: Left;  ARM FISTULA   POLYPECTOMY  03/05/2020   Procedure: POLYPECTOMY;  Surgeon: Shellia Cleverly, DO;  Location: [facility] ENDOSCOPY;  Service: Gastroenterology;;   TEE WITHOUT CARDIOVERSION N/A 12/12/2020   Procedure: TRANSESOPHAGEAL ECHOCARDIOGRAM (TEE);  Surgeon: Wendall Stade, MD;  Location: [facility] ENDOSCOPY;  Service: Cardiovascular;  Laterality: N/A;   Social History:  reports that she has never smoked. She has never used smokeless tobacco. She reports that she does not drink alcohol and does not use drugs.  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: Never  Emotional Status Pt's affect, behavior and adjustment status: Pleasant Recent Psychosocial Issues: Coping Psychiatric History: N/A Substance Abuse History: N/A  Patient / Family Perceptions, Expectations & Goals Pt/Family understanding of illness & functional limitations: yes Premorbid pt/family roles/activities: Independent overall and driving. Active Anticipated changes in roles/activities/participation: Anticpates assitance from friends and siblings Pt/family expectations/goals: Pharmacologist: None Premorbid Home Care/DME Agencies: None Transportation available at discharge: friend Is the patient able to respond to transportation needs?: Yes In the past 12 months, has lack of transportation kept you from medical appointments or from getting medications?: No In the past 12 months, has lack of transportation kept you from meetings, work, or from getting things needed for daily living?: No Resource referrals recommended: Neuropsychology  Discharge Planning Living Arrangements: Non-relatives/Friends, Other relatives Support Systems: Parent, Other relatives, Friends/neighbors Type of Residence: Private residence (level entry apartment) Insurance Resources: Electrical engineer Resources: Family Support, SSD Financial Screen Referred: No Living Expenses: Psychologist, sport and exercise Management: Patient Does the patient have any problems obtaining your medications?: No Home Management: Independent Patient/Family Preliminary Plans: Plans  to continue to manage Care Coordinator Anticipated Follow Up Needs: HH/OP DC Planning Additional Notes/Comments: Discharge address: [address] [place], [place] Expected length of stay: 10-14 Days  Clinical Impression SW met with patient, introduced self and explained role. Patient anticipates discharging back home with assistance from sister's boyfriend and friends. Patient lives in a level entry apartment. Patient aware of current d/c date and no additional questions or concerns .  Andria Rhein 03/26/2023, 12:41 PM

## 2023-03-26 NOTE — Progress Notes (Signed)
Awake majority of the night. Intermittent episodes of rest lasting no longer than 15 to 20 minutes. Continues to have a dry cough throughout the night. Lung sounds auscultated second time to ensure no fluid build up after completion of IV. Right arm continues to be non-pitting. RLE & LLE continue to be +1 pitting.

## 2023-03-26 NOTE — Progress Notes (Signed)
Refused care to sacrum area. Patient requesting to go to the bathroom to have a BM. However, patient refusing to use the toilet, commode, or bed pan. Explained to patient that have to use one of these three options to avoid risking infection to any open wounds. Continue to update accordingly.

## 2023-03-26 NOTE — Progress Notes (Signed)
Feet increasing in swelling throughout the evening. Going from +1 to +2. With patient expressing feeling warm. Endorsing full sensation to lower extremities. Endorses tightness in calf muscles bilaterally. Stretching exercises and ice aiding in elevating pain. About hour after elevating patient's legs with two pillows tilting patient bed slightly back to the wall legs decreasing at +1 bilaterally at this time.

## 2023-03-26 NOTE — Progress Notes (Signed)
Physical Therapy Session Note  Patient Details  Name: Colleen [name] MRN: [id number] Date of Birth: 12/04/1961  Today's Date: 03/26/2023 PT Individual Time: 1101-1157 PT Individual Time Calculation (min): 56 min   Short Term Goals: Week 1:  PT Short Term Goal 1 (Week 1): pt will transfer sit<>stand with LRAD and min A PT Short Term Goal 2 (Week 1): pt will transfer bed<>chair with LRAD and min A PT Short Term Goal 3 (Week 1): pt will ambulate 37ft with LRAD and min A  Skilled Therapeutic Interventions/Progress Updates:  Received pt sitting in WC, pt agreeable to PT treatment, and denied any pain during session. Session with emphasis on functional mobility/transfers, generalized strengthening and endurance, dynamic standing balance/coordination, and gait training. SPO2 99% at rest on 2L. Pt transported to/from room in [facility] dependently for time management purposes. Switched out current legrests for elevating legrests for edema management.   Stood from [facility] with RW and heavy min A and ambulated 2ft with RW and min A with total A to manage O2 tank - limited by fatigue and SPO2 dropped to 87% with ambulation increasing to 94% with pursed lip breathing. Pt politely declined any further ambulation but agreeable to standing exercises. Stood x 4 additional reps with RW and min A and performed the following exercises with emphasis on LE strength: -marches 2x8 bilaterally  -hip abduction 2x8 bilaterally -heel raises 2x10 Attempted to cue pt on proper hand placement to maintain sternal precautions, however pt prefers to place BUE on RW for support but was aware not to place too much weight through UEs. Transported to dayroom and performed seated BLE strengthening on Kinetron at 20 cm/sec for 1 minute x 2 trials with emphasis on glute/quad strength. Returned to room and concluded session with pt sitting in WC, needs within reach, and chair pad alarm on. BLEs elevated and safety plan/RN updated.   Therapy  Documentation Precautions:  Precautions Precautions: Sternal, Fall, ICD/Pacemaker Precaution Comments: Anxious. Monitor (O2 > 90%), and HR Restrictions Weight Bearing Restrictions: No Other Position/Activity Restrictions: sternal precautions  Therapy/Group: Individual Therapy Martin Majestic PT, DPT  03/26/2023, 6:46 AM

## 2023-03-26 NOTE — Progress Notes (Signed)
Physical Therapy Session Note  Patient Details  Name: Colleen [name] MRN: [id number] Date of Birth: [birth year]  Today's Date: 03/26/2023 PT Individual Time: 0902-0945 PT Individual Time Calculation (min): 43 min   Short Term Goals: Week 1:  PT Short Term Goal 1 (Week 1): pt will transfer sit<>stand with LRAD and min A PT Short Term Goal 2 (Week 1): pt will transfer bed<>chair with LRAD and min A PT Short Term Goal 3 (Week 1): pt will ambulate 46ft with LRAD and min A  Skilled Therapeutic Interventions/Progress Updates:     Pt received semi reclined in bed and agrees to therapy. No complaint of pain and reports no shortness of breath. Supine to sit with cues for sequencing and positioning (appears that sister provides ~minA). Sit to stand from EOB with minA and cues for hand placement and sequencing. PT assists to pull up pants and provides manual cueing due to slight posterior bias. MinA for stand step to [facility] with cues for positioning and hand placement. WC transport to gym. Pt on 2.5-3L O2 during session. Pt performs sit to stand with minA and cues for initiation and sequencing. Pt ambulates x50' and x70' with RW and extended seated rest breaks. PT provides cues for pursed lip breathing to optimize oxygen sats, as well as cues for posture and decreasing WB through RW for energy conservation and body mechanics. Pt requires minA to modA due to slight posterior bias and LOB backward. Cues to increase gait speed and anterior weight shifting. Pt ambulates final bout of x20' and stops due to feeling as if she may have "diarrhea". WC transport back to room. Pt left seated in WC with alarm intact and all needs within reach.   Therapy Documentation Precautions:  Precautions Precautions: Sternal, Fall, ICD/Pacemaker Precaution Comments: Anxious. Monitor (O2 > 90%), and HR Restrictions Weight Bearing Restrictions: No Other Position/Activity Restrictions: sternal precautions    Therapy/Group:  Individual Therapy  Beau Fanny, PT, DPT 03/26/2023, 4:50 PM

## 2023-03-26 NOTE — Progress Notes (Signed)
Continues to have a dry cough. At times patient observed coughing voluntarily not involuntarily. Cough is dry with no production. Demonstrated and educated about importance of using a pillow to brace chest incisions, due to being on sternal precautions. Continues to refuse use of pillow to brace herself when coughing. During assessment some edematous at the superior marking site of the incision. Endorses no pain or discomfort in her chest area. Continues to have difficulty sleeping. Continues to place pressure on sacrum area and refusing to have any relief on sacrum area with lowering the head of the bed. Expressing discomfort when upper body is not sitting upright.

## 2023-03-26 NOTE — Progress Notes (Signed)
[facility] Progress Note   Subjective:  Seen in room - RN titrating up O2 as she had been complaining of dyspnea. Dialyzed yesterday - 2.4L removed, then was hypotensive overnight and bolus given. Discussed that extra HD would be helpful - she firmly refuses at this time. She does not make any urine, no role for diuretics. We talked about fluid restriction. No CP.  Objective Vitals:   03/26/23 0153 03/26/23 0300 03/26/23 0500 03/26/23 0559  BP: (!) 88/59 (!) 106/59  (!) 101/55  Pulse: (!) 106   (!) 108  Resp: 20   20  Temp: 97.9 F (36.6 C)   98.1 F (36.7 C)  TempSrc:      SpO2: 100%   100%  Weight:   77.4 kg   Height:       Physical Exam General: Well appearing woman, NAD. Nasal O2 in place Heart:RRR; no murmur; healing mid-line chest incision Lungs:Bibasilar rales present, clear in upper lobes Abdomen: soft Extremities: 2+ BLE edema and 2+ RUE edema Dialysis Access: LUE AVF + bruit  Additional Objective Labs: CBC: Recent Labs  Lab 03/25/23 0030 03/25/23 1053 03/26/23 0557  WBC 9.1 8.6 7.8  HGB 9.4* 9.6* 9.9*  HCT 31.1* 31.1* 32.9*  MCV 96.0 94.2 96.5  PLT 209 175 194   CBG: Recent Labs  Lab 03/24/23 2134 03/25/23 0350 03/25/23 1125 03/25/23 2147 03/26/23 0311  GLUCAP 136* 115* 165* 243* 192*   Medications:   amiodarone  200 mg Oral Daily   aspirin EC  81 mg Oral Daily   atomoxetine  18 mg Oral Daily   cinacalcet  60 mg Oral Q M,W,F   [START ON 03/27/2023] darbepoetin (ARANESP) injection - DIALYSIS  60 mcg Subcutaneous Q Fri-1800   droxidopa  500 mg Oral TID WC   feeding supplement (NEPRO CARB STEADY)  237 mL Oral TID WC   insulin aspart  0-5 Units Subcutaneous QHS   insulin aspart  0-6 Units Subcutaneous TID WC   insulin aspart  12 Units Subcutaneous QPC breakfast   insulin aspart  15 Units Subcutaneous BID AC   melatonin  5 mg Oral QHS   midodrine  20 mg Oral TID WC   multivitamin  1 tablet Oral QHS   nutrition supplement  (JUVEN)  1 packet Oral BID BM   pantoprazole  40 mg Oral BID   simethicone  80 mg Oral QID   Vitamin D (Ergocalciferol)  50,000 Units Oral Q7 days   Warfarin - Pharmacist Dosing Inpatient   Does not apply q1600    Dialysis Orders: MWF SW 3h  400/1.5  69.5kg 2K/2Ca bath  Heparin 3000  AVF LUA - last OP HD 3/13, post wt 69.5kg - sensipar 60mg  po tiw - hectorol 3 mcg IV tiw - venofer 50 mg IV weekly   62yo F s/p [facility] admit 3/13-4/16/24 for MV replacement, TV ring, AV decalcification with removal of LV mass. Post-op, she have A-fib and CHB, underwent leadless PPM on 02/26/23. Hospitalization c/b hypotension, on midodrine + Strattera + Droxidopa. Will be on warfarin + ASA for coagulation. Transferred to CIR for rehab.   Assessment/Plan: 1. Valvular heart disease s/p MVR, TV ring, AoV decalcification with removal LV mass 02/18/23: On warfarin for anticoagulation. 2. ESRD: Will continue outpatient HD schedule - next HD Friday. She refuses to run > 3hr treatment, refusing extra HD for volume removal. 3. HyPOTN/volume: On midodrine 20mg  TID, droxidopa 500mg  TID, and atomoxetine 18mg  daily for hypotension. She is  very edematous - will see what we can get off with dialysis, hopefully start slowly getting her weight down. Per bed weights, she is actually up today despite HD yesterday. 4. Anemia of ESRD: Hgb 9.9 - hard to tell when last ESA was given, starting low dose Aranesp tomorrow. 5. Secondary hyperparathyroidism:  Ca/Phos pending, on cinacalcet  TIW. Wait to see labs before resuming VDRA and/or binders. 6. Nutrition:  On protein supplements 7. T2DM: On insulin, per primary. 8. A-fib: On amiodarone. 9. Debility: In CIR - follow progress.  Ozzie Hoyle, PA-C 03/26/2023, 9:41 AM  [employer]

## 2023-03-26 NOTE — Progress Notes (Signed)
PROGRESS NOTE   Subjective/Complaints: Continues to have insomnia, discussed increasing melatonin to  and she is agreeable Sister notes that she is having shortness of breath  ROS: +insomnia, +shortness of breath   Objective:   No results found. Recent Labs    03/25/23 1053 03/26/23 0557  WBC 8.6 7.8  HGB 9.6* 9.9*  HCT 31.1* 32.9*  PLT 175 194   Recent Labs    03/26/23 0557  NA 131*  K 4.1  CL 91*  CO2 26  GLUCOSE 216*  BUN 28*  CREATININE 4.22*  CALCIUM 7.8*    Intake/Output Summary (Last 24 hours) at 03/26/2023 1041 Last data filed at 03/26/2023 0355 Gross per 24 hour  Intake 920.46 ml  Output 2400 ml  Net -1479.54 ml     Pressure Injury 03/24/23 Buttocks Left Stage 2 -  Partial thickness loss of dermis presenting as a shallow open injury with a red, pink wound bed without slough. (Active)  03/24/23 1500  Location: Buttocks  Location Orientation: Left  Staging: Stage 2 -  Partial thickness loss of dermis presenting as a shallow open injury with a red, pink wound bed without slough.  Wound Description (Comments):   Present on Admission: Yes    Physical Exam: Vital Signs Blood pressure 103/61, pulse (!) 112, temperature 98.7 F (37.1 C), resp. rate 18, height 5' 3.86" (1.622 m), weight 77.4 kg, last menstrual period 12/08/2013, SpO2 92 %. Constitutional:      Appearance: Normal appearance.     Comments: 3L Oxygen per [place] sats 95%. Sitting up in bed in NAD. Mildy anxious appearing.  Awake, alert, appropriate, NAD  HENT:     Head: Normocephalic.     Comments: R facial swelling and R facial droop-  Tongue midline    Right Ear: External ear normal.     Left Ear: External ear normal.     Nose: Nose normal. No congestion.     Mouth/Throat:     Mouth: Mucous membranes are dry.     Pharynx: Oropharynx is clear. No oropharyngeal exudate.  Eyes:     General:        Right eye: No discharge.         Left eye: No discharge.     Extraocular Movements: Extraocular movements intact.  Cardiovascular:     Rate and Rhythm: Tachycardia present. Rhythm irregular.     Heart sounds: Normal heart sounds.     No gallop.     Comments: Sternal incision and upper abd incision with sutures in place Pulmonary:     Comments: Increased NS to 2.5L from 2 Decreased breath sounds- mainly at bases B/L  Abdominal:     General: Bowel sounds are normal. There is no distension.     Palpations: Abdomen is soft.     Tenderness: There is no abdominal tenderness.  Musculoskeletal:     Cervical back: Neck supple. No tenderness.     Comments: Ue's 5-/5 B/L HF/KE 3+/5 B/L; DF/PF 4+/5 B/L  Skin:    General: Skin is warm and dry.     Comments: 2-3+ edema BLE- and some wooding in hands and arms- from reduced swelling L buttock  pressure ulcer- Stage II- ~1.5 x 0.5 cm- pink- slightly open Incision closed on R chest and L chest from prior HD catheters Fistula L upper arm- (+) thrill  Neurological:     Mental Status: She is alert.     Comments: Right facial weakness without dysarthria. Speech slow but clear. Oriented X 4. She was able to follow simple motor commands. Anxious at times--does not like to be closed in. She was only able to see one number on the clock.   Intact to light touch in all 4 extremities per pt  Fully oriented  Psychiatric:     Comments: Somewhat anxious    Assessment/Plan: 1. Functional deficits which require 3+ hours per day of interdisciplinary therapy in a comprehensive inpatient rehab setting. Physiatrist is providing close team supervision and 24 hour management of active medical problems listed below. Physiatrist and rehab team continue to assess barriers to discharge/monitor patient progress toward functional and medical goals  Care Tool:  Bathing    Body parts bathed by patient: Right arm, Left arm, Chest, Abdomen, Right upper leg, Left upper leg, Face   Body parts bathed by  helper: Front perineal area, Buttocks, Right lower leg, Left lower leg     Bathing assist Assist Level: Maximal Assistance - Patient 24 - 49%     Upper Body Dressing/Undressing Upper body dressing   What is the patient wearing?: Pull over shirt    Upper body assist Assist Level: Total Assistance - Patient < 25%    Lower Body Dressing/Undressing Lower body dressing      What is the patient wearing?: Incontinence brief, Pants     Lower body assist Assist for lower body dressing: Total Assistance - Patient < 25%     Toileting Toileting    Toileting assist Assist for toileting: Total Assistance - Patient < 25%     Transfers Chair/bed transfer  Transfers assist  Chair/bed transfer activity did not occur: Safety/medical concerns (weakness, fear)  Chair/bed transfer assist level: Minimal Assistance - Patient > 75%     Locomotion Ambulation   Ambulation assist   Ambulation activity did not occur: Safety/medical concerns (fatigue, fear, generalized weakness)  Assist level: Moderate Assistance - Patient 50 - 74% Assistive device: Walker-rolling Max distance: 87'   Walk 10 feet activity   Assist  Walk 10 feet activity did not occur: Safety/medical concerns (fatigue, fear, generalized weakness)  Assist level: Moderate Assistance - Patient - 50 - 74% Assistive device: Walker-rolling   Walk 50 feet activity   Assist Walk 50 feet with 2 turns activity did not occur: Safety/medical concerns (fatigue, fear, generalized weakness)  Assist level: Moderate Assistance - Patient - 50 - 74% Assistive device: Walker-rolling    Walk 150 feet activity   Assist Walk 150 feet activity did not occur: Safety/medical concerns (fatigue, fear, generalized weakness)         Walk 10 feet on uneven surface  activity   Assist Walk 10 feet on uneven surfaces activity did not occur: Safety/medical concerns (fatigue, fear, generalized weakness)          Wheelchair     Assist Is the patient using a wheelchair?: Yes Type of Wheelchair: Manual Wheelchair activity did not occur: Safety/medical concerns (fatigue, fear, generalized weakness)         Wheelchair 50 feet with 2 turns activity    Assist    Wheelchair 50 feet with 2 turns activity did not occur: Safety/medical concerns (fatigue, fear, generalized weakness)  Wheelchair 150 feet activity     Assist  Wheelchair 150 feet activity did not occur: Safety/medical concerns (fatigue, fear, generalized weakness)       Blood pressure 103/61, pulse (!) 112, temperature 98.7 F (37.1 C), resp. rate 18, height 5' 3.86" (1.622 m), weight 77.4 kg, last menstrual period 12/08/2013, SpO2 92 %.    Medical Problem List and Plan: 1. Functional deficits secondary to debility after MV repair with sternal precautions             -patient may  shower if cover incisions- HD catheters in chest are out- also has sternal incisions that need to be covered             -ELOS/Goals: 10-14 days supervision to min A 2.  Mechanical MVR: INR therapeutic and heparin drip d/ced.  Pharmaceutical: Coumadin and Lovenox             -antiplatelet therapy: ASA 3. Pain Management:  Tylenol prn. Lidocaine patches prn. Denies pain 4. Insomnia: increase melatonin to 5mg  HS 5. Anxiety: neuropsych consulted 6. Skin/Wound Care: Routine pressure relief measures with frequent pressure relief measures for sacral decub.             --Interdry to skin folds.              --Add vitamin C and Zinc to promote wound healing 7. Fluids/Electrolytes/Nutrition: Strict I/O. Labs with HD.  --Will keep diet as regular to help with food choices             --Juven and nephro added. 8. MVR/TVR by Dr. Felipa Furnace [phone]): Continue Sternal precautions.              --PPM for CHB             --coumadin with INR goal 2.5-3.5 9.  Hypotension: Monitor for symptoms with increase in activity             --on midodrine,  Staterra and Droxidopa.  10.  ESRD: Schedule HD at the end of the day. Renal diet with 1200 cc FR.  11. T2DM: Monitor BS 5 X day with novolog 12 units w/breakfast and 15 units BID. Provided list of foods that are good for diabetes 12. A fib: Monitor HR TID. No BB due to hypotension. HR continues to be elevated, continue to monitor             --on coumadin. 13. Anemia of chronic disease: Monitor H/H with HD labs/             --transfuse prn Hgb <7.0 or if symptomatic.  14. Anxiety- pt cannot tolerate door/curtain closed 15. New O2 requirement with mild tachypnea - also has Fluid in LE's- likely fluid related, however will need to wean O2 as tolerated and monitor Vitals, to make sure these Sx's improve 16. Overweight: provide dietary education 17. Vitamin D insufficiency: start ergocalciferol 50,000U once per week for 7 weeks 18. Shortness of breath: increased nasal cannula flow to 2.5L from 2 19. Hypotension: will not add metoprolol  >50 minutes spent in review of chart and vitals, tachycardic but will not add lopressor due to hypotension, increased nasal cannula rate back up to 2.5L as discussed with sister she has been having some shortness of breath, discussed INR is therapeutic so heparin drip has been d/ced, discussed insomnia and increasing melatonin to 10mg  at night, discussed that this medication can help resent ciracdian rhythm since she is not getting sunlight exposure while hospitalized, provided  list of foods to help with her blood sugar control  LOS: 2 days A FACE TO FACE EVALUATION WAS PERFORMED  Drema Pry [place] Schnitker 03/26/2023, 10:41 AM

## 2023-03-26 NOTE — Progress Notes (Signed)
2119 CBG was "70" rechecked shortly after having some graham crackers. Refusing Nepro shake due to giving her issues with loose stools. 2224 CBG was 82. Does acknowledge having hypoglycemic events in the past and explained the symptoms experiences from these events. Endorsed having "shakes". Talked about symptoms of low blood pressure and low glucose levels to make staff aware.  Continues to endorse shortness of breath. Explains wanting to keep the elevation of her bed 50 to 60 degrees due to sensation feels in her lungs when laying down. Initially explained sensation of "air in her lungs". With explanation talked about feeling fluid in her lungs when laying more supine and HOB less than 50 degrees. Also, talked about difficulty with inspiration and taking in a full breath. Explains this causing her to feel short of breath. Adjusted nasal cannula to proper position in the nares does not want the tubing too tight preferring to be loose comes displaced at times.

## 2023-03-26 NOTE — Progress Notes (Signed)
   03/26/23 1229  Spiritual Encounters  Type of Visit Initial  Care provided to: Patient  Referral source Nurse (RN/NT/LPN)  Reason for visit Advance directives  OnCall Visit No   Chaplain presented education for ACD and PT. PT will discuss with her family members and contact spiritual care when she is ready to notarize.

## 2023-03-26 NOTE — Progress Notes (Signed)
Occupational Therapy Session Note  Patient Details  Name: Colleen [name] MRN: [id number] Date of Birth: 05-Dec-1961  Today's Date: 03/26/2023 OT Individual Time: 1000-1030 OT Individual Time Calculation (min): 30 min    Short Term Goals: Week 1:  OT Short Term Goal 1 (Week 1): Pt will complete 1/3 toileting steps with CGA OT Short Term Goal 2 (Week 1): Pt will complete sit > stand in prep for ADL with max A using LRAD OT Short Term Goal 3 (Week 1): Pt will complete transfer with mod A of 1 using LRAD to promote OOB toileting  Skilled Therapeutic Interventions/Progress Updates:    1:1 Pt received in the w/c. Focus on toilet transfers and toileting. Discussed the plan for the session. Pt nervious about going into the bathroom and wanted to make sure all the doors remain open and that this OT would not leave the room or her side. Pt was able to transfer with the grab bar with min A and performed toileting steps with total A but was able to maintain standing balance with UE support with contact guard. Pt was able to void a BM and nursing was able to attend to her bottom skin issues. Pt transferred back into the w/c with min A.  Hand washing with setup.  Pt  transferred into recliner to try to sit with LEs elevated but didn't like the position transferred back into the w/c with min A.  Left sitting up by door with safety pad and call bell.   2nd session 13:00-14:00 1:1 Pt received in the w/c. Pt taken to the dayroom to focus on activity tolerance and cardiopulmonary endurance. Pt ambulated about 10 feet with contact guard with RW (min A for sit to stand )to the Nustep and performed 5 min with resistance of 2 at a very slow speed. Ambulated to the EOM next to Nustep with min A. Performed head/neck and shoulders stretches and ROM. Then performed shoulder exercises of bouncing back a small beach ball with a boom stick with bilateral UEs.  Pt then ambulated ~30 feet with RW with min to to contact guard.  Pt then wanted to get into the bed at end of session - min A for transfer with RW.  Required mod A to get both Les back into bed. Pt reports that she only wants to sit in a totally upright position. Pt with call bell and bell alarm on.   Therapy Documentation Precautions:  Precautions Precautions: Sternal, Fall, ICD/Pacemaker Precaution Comments: Anxious. Monitor (O2 > 90%), and HR Restrictions Weight Bearing Restrictions: No Other Position/Activity Restrictions: sternal precautions General:   Vital Signs: Therapy Vitals Temp: 97.9 F (36.6 C) Temp Source: Axillary Pulse Rate: 90 Resp: 18 BP: (!) 83/65 Patient Position (if appropriate): Lying Oxygen Therapy SpO2: 100 % O2 Device: Room Air Pain: No c/o pain in either session   Therapy/Group: Individual Therapy  Roney Mans [facility] 03/26/2023, 3:48 PM

## 2023-03-27 ENCOUNTER — Inpatient Hospital Stay (HOSPITAL_COMMUNITY): Payer: Medicare Other

## 2023-03-27 DIAGNOSIS — R5381 Other malaise: Secondary | ICD-10-CM | POA: Diagnosis not present

## 2023-03-27 LAB — RENAL FUNCTION PANEL
Albumin: 2.2 g/dL — ABNORMAL LOW (ref 3.5–5.0)
Anion gap: 14 (ref 5–15)
BUN: 34 mg/dL — ABNORMAL HIGH (ref 6–20)
CO2: 25 mmol/L (ref 22–32)
Calcium: 8.2 mg/dL — ABNORMAL LOW (ref 8.9–10.3)
Chloride: 93 mmol/L — ABNORMAL LOW (ref 98–111)
Creatinine, Ser: 5.3 mg/dL — ABNORMAL HIGH (ref 0.44–1.00)
GFR, Estimated: 9 mL/min — ABNORMAL LOW (ref >60)
Glucose, Bld: 120 mg/dL — ABNORMAL HIGH (ref 70–99)
Phosphorus: 4.7 mg/dL — ABNORMAL HIGH (ref 2.5–4.6)
Potassium: 4.3 mmol/L (ref 3.5–5.1)
Sodium: 132 mmol/L — ABNORMAL LOW (ref 135–145)

## 2023-03-27 LAB — CBC WITH DIFFERENTIAL/PLATELET
Abs Immature Granulocytes: 0.03 10*3/uL (ref 0.00–0.07)
Basophils Absolute: 0.1 10*3/uL (ref 0.0–0.1)
Basophils Relative: 1 %
Eosinophils Absolute: 0.1 10*3/uL (ref 0.0–0.5)
Eosinophils Relative: 1 %
HCT: 31.3 % — ABNORMAL LOW (ref 36.0–46.0)
Hemoglobin: 9.5 g/dL — ABNORMAL LOW (ref 12.0–15.0)
Immature Granulocytes: 0 %
Lymphocytes Relative: 11 %
Lymphs Abs: 0.8 10*3/uL (ref 0.7–4.0)
MCH: 28.6 pg (ref 26.0–34.0)
MCHC: 30.4 g/dL (ref 30.0–36.0)
MCV: 94.3 fL (ref 80.0–100.0)
Monocytes Absolute: 0.9 10*3/uL (ref 0.1–1.0)
Monocytes Relative: 11 %
Neutro Abs: 5.8 10*3/uL (ref 1.7–7.7)
Neutrophils Relative %: 76 %
Platelets: 184 10*3/uL (ref 150–400)
RBC: 3.32 MIL/uL — ABNORMAL LOW (ref 3.87–5.11)
RDW: 18.4 % — ABNORMAL HIGH (ref 11.5–15.5)
WBC: 7.7 10*3/uL (ref 4.0–10.5)
nRBC: 0.4 % — ABNORMAL HIGH (ref 0.0–0.2)

## 2023-03-27 LAB — GLUCOSE, CAPILLARY
Glucose-Capillary: 123 mg/dL — ABNORMAL HIGH (ref 70–99)
Glucose-Capillary: 104 mg/dL — ABNORMAL HIGH (ref 70–99)
Glucose-Capillary: 149 mg/dL — ABNORMAL HIGH (ref 70–99)
Glucose-Capillary: 215 mg/dL — ABNORMAL HIGH (ref 70–99)
Glucose-Capillary: 216 mg/dL — ABNORMAL HIGH (ref 70–99)

## 2023-03-27 LAB — PROTIME-INR
INR: 3.7 — ABNORMAL HIGH (ref 0.8–1.2)
Prothrombin Time: 36.1 seconds — ABNORMAL HIGH (ref 11.4–15.2)

## 2023-03-27 MED ORDER — HEPARIN SODIUM (PORCINE) 1000 UNIT/ML DIALYSIS
3000.0000 [IU] | Freq: Once | INTRAMUSCULAR | Status: AC
  Administered 2023-03-27: 3000 [IU] via INTRAVENOUS_CENTRAL
  Filled 2023-03-27: qty 3

## 2023-03-27 MED ORDER — PENTAFLUOROPROP-TETRAFLUOROETH EX AERO: 1.0000 | INHALATION_SPRAY | CUTANEOUS | Status: DC | PRN

## 2023-03-27 MED ORDER — LIDOCAINE-PRILOCAINE 2.5-2.5 % EX CREA
1.0000 | TOPICAL_CREAM | CUTANEOUS | Status: DC | PRN
  Filled 2023-03-27: qty 5

## 2023-03-27 MED ORDER — WARFARIN SODIUM 1 MG PO TABS
1.0000 mg | ORAL_TABLET | Freq: Every day | ORAL | Status: DC
  Administered 2023-03-27: 1 mg via ORAL
  Filled 2023-03-27 (×2): qty 1

## 2023-03-27 MED ORDER — LIDOCAINE HCL (PF) 1 % IJ SOLN: 5.0000 mL | INTRAMUSCULAR | Status: DC | PRN

## 2023-03-27 NOTE — Progress Notes (Signed)
ANTICOAGULATION CONSULT NOTE   Pharmacy Consult for Warfarin , INR goal 2.5-3.5  Indication: Mechanical MVR  Allergies  Allergen Reactions   Farxiga [Dapagliflozin] Other (See Comments)    Cannot take due to kidneys   Hydrocodone Other (See Comments)    "Increase Glucose level," per pt   Kombiglyze Xr [Saxagliptin-Metformin Er] Other (See Comments)    Cannot take due to kidneys    Patient Measurements: Height: 5' 3.86" (162.2 cm) (from [facility] records on 02/18/23) Weight: 75 kg (165 lb 5.5 oz) IBW/kg (Calculated) : 54.37 Heparin Dosing Weight: 70.1 kg   Vital Signs: Temp: 98.3 F (36.8 C) (04/19 0245) Temp Source: Axillary (04/19 0245) BP: 97/55 (04/19 1258) Pulse Rate: 90 (04/19 1258)  Labs: Recent Labs    03/24/23 1547 03/24/23 1547 03/25/23 0030 03/25/23 1053 03/26/23 0557 03/27/23 0525  HGB  --    < > 9.4* 9.6* 9.9* 9.5*  HCT  --    < > 31.1* 31.1* 32.9* 31.3*  PLT  --    < > 209 175 194 184  LABPROT 24.6*  --  25.6* 26.7* 29.8* 36.1*  INR 2.2*  --  2.4* 2.5* 2.9* 3.7*  HEPARINUNFRC <0.10*  --  0.10* 0.29*  --   --   CREATININE  --   --   --   --  4.22* 5.30*   < > = values in this interval not displayed.     Estimated Creatinine Clearance: 11.2 mL/min (A) (by C-G formula based on SCr of 5.3 mg/dL (H)).   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 Then developed CHB and afib post-op and on 3/21 underwent leadless PPM placement.   Pharmacy consult for IV heparin and Warfarin , goal INR 2.5-3.5 for mechantcal MVR.   She also has a history of DVT/PE. 2016, 2019)  Patient reports she was not taking any anticoagulation prior to the [facility] admission, and it has been years since she has taken Eliquis.   At [place] [facility] she was started on Warfarin .  She received Heparin IV 1300 units/hr 4/13-4/15 then hehparin drip was discontinued on  4/15 (PTT was 51.3 sec on 4/15) , when INR 1.8.  Today the INR = 2.3 (4/16 AM) at [facility].  Goal INR 2.5-3.5 INR drawn  admit 4/16 = 2.2 She received  Warfarin   , , , , 3.5mg  ,3.5mg   from 4/10 -03/23/23 at [facility],  Per [facility] records , Warfarin started ~02/21/23  s/p mechanical MVR 02/19/23.     Amiodarone has potential drug-drug interaction(DDI) w/Warfarin,  to increase hypoprothrombinemic effect of Warfarin. Monitoring this by protime [facility] [place] labs.  Per [facility]  records Amiodarone  started  ~3/21  w/load, Amio  QD since 4/10 and Warfarin started ~02/21/23 at [facility]), thus not a new /additional med.   [facility]  records reviewed, found on warfarin 2.5mg  daily as of 02/21/23 and most recent doses given at [facility]  prior to [facility] admit (4/16) were  , , , , 3.5mg  ,3.5mg   from 4/10 -03/23/23.    Most recent INR trend at [facility]  4/14-4/16/24: INR 1.7-1.8-2.3 / repeat 2.2.  INR  2.4/ repeat 2.5 on 4/17 and today 4/18 INR is 2.9, therapeutic . Goal INR = 2.5-3.5.    Heparin drip discontinued on 4/17 due to therapeutic INR.   03/27/23 update: INR increased to 3.7 today, Goal INR 2.5-3.5. Increase in INR from 2.9 to 3.7 likely due to amiodarone effect on warfarin, however has been on both medications since since ~02/26/23, started at [facility].    CBC is stable with Hgb 9.9, pltc 184k.   No active bleeding reported.   Goal of Therapy:  INR 2.5-3.5    Plan:  Give  Warfarin 1 mg today.  Daily  INR, CBC Continue to monitor for potential drug -drug interaction with amiodarone and warfarin.   Thank you for allowing pharmacy to be part of this patients care team.   Noah Delaine, RPh Clinical Pharmacist 03/27/2023 1:43 PM Please check AMION for all [facility] Pharm[facility] numbers

## 2023-03-27 NOTE — Progress Notes (Signed)
Continues to have difficulty adjusting to hospital environment. Needing reassurance and frequent checks ins by staff throughout the night. Sleep pattern erratic waking up periodically throughout the night. Two episodes of epistaxis throughout the night w/minimal to scant drainage.

## 2023-03-27 NOTE — Progress Notes (Signed)
PROGRESS NOTE   Subjective/Complaints: Discussed shortness of breath, she feels it is more related to anxiety, discussed CXR results, ordering incentive spirometer, checking daily weights  ROS: +insomnia, +shortness of breath, +anxiety   Objective:   DG Chest 2 View  Result Date: 03/27/2023 CLINICAL DATA:  Shortness of breath. EXAM: CHEST - 2 VIEW COMPARISON:  CTA chest 12/08/2020.  One-view chest x-ray 12/08/2020. FINDINGS: The heart is enlarged. Monitoring device is in place. Atherosclerotic calcifications are present at the aortic arch. Aortic valve is noted. A right pleural effusion is present. Asymmetric interstitial edema is present on the right. Dependent airspace disease is noted in the lower lobe. Mild pulmonary vascular congestion is present on the left without focal airspace disease. Surgical clips are present in the right axilla. The visualized soft tissues and bony thorax are otherwise unremarkable. IMPRESSION: 1. Cardiomegaly with asymmetric interstitial edema and right pleural effusion compatible with congestive heart failure. 2. Dependent airspace disease in the right lower lobe likely reflects atelectasis. Infection is not excluded. Electronically Signed   By: Marin Roberts M.D.   On: 03/27/2023 10:41   Recent Labs    03/26/23 0557 03/27/23 0525  WBC 7.8 7.7  HGB 9.9* 9.5*  HCT 32.9* 31.3*  PLT 194 184   Recent Labs    03/26/23 0557 03/27/23 0525  NA 131* 132*  K 4.1 4.3  CL 91* 93*  CO2 26 25  GLUCOSE 216* 120*  BUN 28* 34*  CREATININE 4.22* 5.30*  CALCIUM 7.8* 8.2*    Intake/Output Summary (Last 24 hours) at 03/27/2023 1129 Last data filed at 03/27/2023 0900 Gross per 24 hour  Intake 518 ml  Output --  Net 518 ml     Pressure Injury 03/24/23 Buttocks Left Stage 2 -  Partial thickness loss of dermis presenting as a shallow open injury with a red, pink wound bed without slough. (Active)   03/24/23 1500  Location: Buttocks  Location Orientation: Left  Staging: Stage 2 -  Partial thickness loss of dermis presenting as a shallow open injury with a red, pink wound bed without slough.  Wound Description (Comments):   Present on Admission: Yes    Physical Exam: Vital Signs Blood pressure 109/62, pulse 92, temperature 98.3 F (36.8 C), temperature source Axillary, resp. rate 16, height 5' 3.86" (1.622 m), weight 75 kg, last menstrual period 12/08/2013, SpO2 100 %. Constitutional:      Appearance: Normal appearance.     Comments: 2L Oxygen per [place] sats 98%. Sitting up in bed in NAD. Mildy anxious appearing.  Awake, alert, appropriate, NAD  HENT:     Head: Normocephalic.     Comments: R facial swelling and R facial droop-  Tongue midline    Right Ear: External ear normal.     Left Ear: External ear normal.     Nose: Nose normal. No congestion.     Mouth/Throat:     Mouth: Mucous membranes are dry.     Pharynx: Oropharynx is clear. No oropharyngeal exudate.  Eyes:     General:        Right eye: No discharge.        Left eye: No discharge.  Extraocular Movements: Extraocular movements intact.  Cardiovascular:     Rate and Rhythm: Tachycardia present. Rhythm irregular.     Heart sounds: Normal heart sounds.     No gallop.     Comments: Sternal incision and upper abd incision with sutures in place Pulmonary:     Comments: Increased NS to 2.5L from 2 Decreased breath sounds- mainly at bases B/L  Abdominal:     General: Bowel sounds are normal. There is no distension.     Palpations: Abdomen is soft.     Tenderness: There is no abdominal tenderness.  Musculoskeletal:     Cervical back: Neck supple. No tenderness.     Comments: Ue's 5-/5 B/L HF/KE 3+/5 B/L; DF/PF 4+/5 B/L  Skin:    General: Skin is warm and dry.     Comments: 2-3+ edema BLE- and some wooding in hands and arms- from reduced swelling L buttock pressure ulcer- Stage II- ~1.5 x 0.5 cm- pink- slightly  open Incision closed on R chest and L chest from prior HD catheters Fistula L upper arm- (+) thrill  +LE edema Neurological:     Mental Status: She is alert.     Comments: Right facial weakness without dysarthria. Speech slow but clear. Oriented X 4. She was able to follow simple motor commands. Anxious at times--does not like to be closed in. She was only able to see one number on the clock.   Intact to light touch in all 4 extremities per pt  Fully oriented  Psychiatric:     Comments: Somewhat anxious    Assessment/Plan: 1. Functional deficits which require 3+ hours per day of interdisciplinary therapy in a comprehensive inpatient rehab setting. Physiatrist is providing close team supervision and 24 hour management of active medical problems listed below. Physiatrist and rehab team continue to assess barriers to discharge/monitor patient progress toward functional and medical goals  Care Tool:  Bathing    Body parts bathed by patient: Right arm, Left arm, Chest, Abdomen, Right upper leg, Left upper leg, Face   Body parts bathed by helper: Front perineal area, Buttocks, Right lower leg, Left lower leg     Bathing assist Assist Level: Maximal Assistance - Patient 24 - 49%     Upper Body Dressing/Undressing Upper body dressing   What is the patient wearing?: Pull over shirt    Upper body assist Assist Level: Total Assistance - Patient < 25%    Lower Body Dressing/Undressing Lower body dressing      What is the patient wearing?: Incontinence brief, Pants     Lower body assist Assist for lower body dressing: Total Assistance - Patient < 25%     Toileting Toileting    Toileting assist Assist for toileting: Total Assistance - Patient < 25%     Transfers Chair/bed transfer  Transfers assist  Chair/bed transfer activity did not occur: Safety/medical concerns (weakness, fear)  Chair/bed transfer assist level: Minimal Assistance - Patient > 75%      Locomotion Ambulation   Ambulation assist   Ambulation activity did not occur: Safety/medical concerns (fatigue, fear, generalized weakness)  Assist level: Minimal Assistance - Patient > 75% Assistive device: Walker-rolling Max distance: 30ft   Walk 10 feet activity   Assist  Walk 10 feet activity did not occur: Safety/medical concerns (fatigue, fear, generalized weakness)  Assist level: Moderate Assistance - Patient - 50 - 74% Assistive device: Walker-rolling   Walk 50 feet activity   Assist Walk 50 feet with 2 turns activity  did not occur: Safety/medical concerns (fatigue, fear, generalized weakness)  Assist level: Moderate Assistance - Patient - 50 - 74% Assistive device: Walker-rolling    Walk 150 feet activity   Assist Walk 150 feet activity did not occur: Safety/medical concerns (fatigue, fear, generalized weakness)         Walk 10 feet on uneven surface  activity   Assist Walk 10 feet on uneven surfaces activity did not occur: Safety/medical concerns (fatigue, fear, generalized weakness)         Wheelchair     Assist Is the patient using a wheelchair?: Yes Type of Wheelchair: Manual Wheelchair activity did not occur: Safety/medical concerns (fatigue, fear, generalized weakness)         Wheelchair 50 feet with 2 turns activity    Assist    Wheelchair 50 feet with 2 turns activity did not occur: Safety/medical concerns (fatigue, fear, generalized weakness)       Wheelchair 150 feet activity     Assist  Wheelchair 150 feet activity did not occur: Safety/medical concerns (fatigue, fear, generalized weakness)       Blood pressure 109/62, pulse 92, temperature 98.3 F (36.8 C), temperature source Axillary, resp. rate 16, height 5' 3.86" (1.622 m), weight 75 kg, last menstrual period 12/08/2013, SpO2 100 %.    Medical Problem List and Plan: 1. Functional deficits secondary to debility after MV repair with sternal precautions              -patient may  shower if cover incisions- HD catheters in chest are out- also has sternal incisions that need to be covered             -ELOS/Goals: 10-14 days supervision to min A 2.  Mechanical MVR: INR therapeutic and heparin drip d/ced.  Pharmaceutical: Coumadin and Lovenox             -antiplatelet therapy: ASA 3. Pain Management:  Tylenol prn. Lidocaine patches prn. Denies pain 4. Insomnia: increase melatonin to 5mg  HS 5. Anxiety: neuropsych consulted 6. Skin/Wound Care: Routine pressure relief measures with frequent pressure relief measures for sacral decub.             --Interdry to skin folds.              --Add vitamin C and Zinc to promote wound healing 7. Fluids/Electrolytes/Nutrition: Strict I/O. Labs with HD.  --Will keep diet as regular to help with food choices             --Juven and nephro added. 8. MVR/TVR by Dr. Felipa Furnace [phone]): Continue Sternal precautions.              --PPM for CHB             --coumadin with INR goal 2.5-3.5 9.  Hypotension: Monitor for symptoms with increase in activity             --on midodrine, Staterra and Droxidopa.  10.  ESRD: Schedule HD at the end of the day. Renal diet with 1200 cc FR.  11. T2DM: Monitor BS 5 X day with novolog 12 units w/breakfast and 15 units BID. Provided list of foods that are good for diabetes 12. A fib: Monitor HR TID. No BB due to hypotension. HR continues to be elevated, continue to monitor             --on coumadin. 13. Anemia of chronic disease: Monitor H/H with HD labs/             --  transfuse prn Hgb <7.0 or if symptomatic.  14. Anxiety- pt cannot tolerate door/curtain closed 15. New O2 requirement with mild tachypnea - also has Fluid in LE's- likely fluid related, however will need to wean O2 as tolerated and monitor Vitals, to make sure these Sx's improve 16. Overweight: provide dietary education 17. Vitamin D insufficiency: start ergocalciferol 50,000U once per week for 7 weeks 18. Shortness  of breath: improved, reviewed CXR results with her that show pleural effusion and atelectasis 19. Hypotension: will not add metoprolol, teds ordered 20. LE edema: teds ordered 21. Atelectasis: incentive spirometer ordered  >50 minutes spent in reviewing chart, examination of patient, discussing XR results with her, discussing edema with therapy and patient, ordering teds for her only when she wants to wear them, completing IPOC, flowsheet and labs reviewed and discussed she has been afebrile and with normal WBC    LOS: 3 days A FACE TO FACE EVALUATION WAS PERFORMED  Clint Bolder P Lewin Pellow 03/27/2023, 11:29 AM

## 2023-03-27 NOTE — Progress Notes (Signed)
Occupational Therapy Session Note  Patient Details  Name: Colleen [name] MRN: [id number] Date of Birth: [birth year]  Today's Date: 03/27/2023 OT Individual Time: 0626-9485 & 4627-0350 OT Individual Time Calculation (min): 57 min & 69 min   Short Term Goals: Week 1:  OT Short Term Goal 1 (Week 1): Pt will complete 1/3 toileting steps with CGA OT Short Term Goal 2 (Week 1): Pt will complete sit > stand in prep for ADL with max A using LRAD OT Short Term Goal 3 (Week 1): Pt will complete transfer with mod A of 1 using LRAD to promote OOB toileting  Skilled Therapeutic Interventions/Progress Updates:  Session 1 Skilled OT intervention completed with focus on ADL retraining and functional transfers. Pt received upright in bed, agreeable to session. No pain reported.  Pt remained less anxious and more pleasant/cooperative this session than 1st session with this OT on eval day. Pt with noted +2 pitting edema in B feet. MD notified of request for order for TEDs (knee high) to trial for edema management, as pt agreeable though needed coaxing and education on them when donning with total A. Transitioned to EOB with supervision with HOB elevated to 90 degrees, adequate adherence to sternal precautions. Able to sit EOB with supervision during UB bathing, then min A UB dressing for button down with pt able to button shirt after OT set up first button.   Sit > stand with heavy min A using RW, though pt unable to adhere to using only 1 hand on RW to stand vs both to lessen strain on her sternum. Min A stand pivot using RW with min verbal cues for stepping pattern. Able to wash B thighs with set up A (with blood noted from smear overnight 2/2 nose bleed per pt report). Threaded pants for energy conservation with max A, then stood with min A using RW, for total A brief removal/threading of new one with pt able to maintain stance with supervision. Able to assist with pulling up of pants with max A.   Min A  needed to open containers for consumption of breakfast. Pt remained seated in w/c, with all needs in reach at end of session.  Vitals -Received on 2.5L via [place], SPO2 98% -Titrated pt down to 2 L during session with SPO2 at 90% even with activity therefore remained on 2L at end of session  Session 2 Skilled OT intervention completed with focus on self-care, standing tolerance, functional transfers. Pt received seated in w/c exiting bathroom with direct care handoff from PT, agreeable to session. No pain reported.  Able to wash hands and complete oral care sitting at sink with set up A. Pt requested to remove knee high TEDs, despite encouraging they stay on until this therapist returns her to bed for dialysis for edema management. Doffed with total A, and switched out portable O2 tanks while nephrology and MD consulted pt for rounds. MD notified of pt having productive cough with green sputum noted in suction.  Transported pt dependently in w/c <> gym for time. Encouraged pt to stand without AD at elevated table to educate pt on pushing up with 1 arm, and other on surface in front of her for translation to stands with RW within her sternal precautions. With cues for hand placement, and counting to 3 with rocking method, pt stood with min A to table. Able to maintain standing with CGA for duration of 3 mins or longer each trial (3) during medium level pegboard design on elevated  surface for BUE endurance. Pt with low vision, therefore needed min A for accuracy of color selection, and some orientation of pieces, otherwise supervision. 1 seated rest break needed for fatigue and breathing, however able to use standing rest break on 3rd trial with pursed lip breathing.  Back in room, pt completed min A sit > stand using RW with good carryover of 1 arm pushing up and other on walker with guidance. Min/CGA stand pivot to EOB using RW. Doffed pants with total A, then with min A pt able to doff shirt including  undoing buttons. Donned gown with min A. Educated pt on bed mobility to transition into bed however with poor ability to lift either LE requiring max A for R, mod A for L, then OT educated on single leg bridge for scooting hips over however requiring max A to do so. Positioned pillows under R buttock for pressure relief. Pt remained sitting upright at 90 deg per request, with bed alarm on/activated, sister present and with all needs in reach at end of session.  Vitals -Received on 2L via [place], SPO2 98% -Remained on 2L as with activity, pt did desat to 80% but with pursed lip breathing able to rebound -Titrated down to 1.5 L for resting period, with SPO2 at 98%   Therapy Documentation Precautions:  Precautions Precautions: Sternal, Fall, ICD/Pacemaker Precaution Comments: Anxious. Monitor (O2 > 90%), and HR Restrictions Weight Bearing Restrictions: No Other Position/Activity Restrictions: sternal precautions    Therapy/Group: Individual Therapy  Melvyn Novas, MS, OTR/L  03/27/2023, 12:17 PM

## 2023-03-27 NOTE — Progress Notes (Signed)
[facility] Progress Note   Subjective:    Seen and examined patient at bedside just finished with OT. Remains on O2 and appears labored with exertion. She is very edematous. Discussed with her again on doing an extra HD but she firmly refuses. Continue to push UF. On Midodrine, Droxidopa, and Atomoxetine. Plan for HD this afternoon.  Objective Vitals:   03/26/23 2140 03/26/23 2245 03/27/23 0245 03/27/23 0500  BP: 106/60 103/62 109/62   Pulse: 92 93 92   Resp:  20 16   Temp:  98.3 F (36.8 C) 98.3 F (36.8 C)   TempSrc:  Axillary Axillary   SpO2:  100% 100%   Weight:    75 kg  Height:       Physical Exam General: Well appearing woman, NAD but breathing is mildly labored when talking. Nasal O2 in place Heart:RRR; no murmur; healing mid-line chest incision Lungs:Bibasilar rales present, clear in upper lobes Abdomen: soft Extremities: 2+ BLE edema and 2+ RUE edema Dialysis Access: LUE AVF + bruit  Filed Weights   03/25/23 0402 03/26/23 0500 03/27/23 0500  Weight: 75.4 kg 77.4 kg 75 kg    Intake/Output Summary (Last 24 hours) at 03/27/2023 1116 Last data filed at 03/27/2023 0900 Gross per 24 hour  Intake 518 ml  Output --  Net 518 ml    Additional Objective Labs: Basic Metabolic Panel: Recent Labs  Lab 03/26/23 0557 03/27/23 0525  NA 131* 132*  K 4.1 4.3  CL 91* 93*  CO2 26 25  GLUCOSE 216* 120*  BUN 28* 34*  CREATININE 4.22* 5.30*  CALCIUM 7.8* 8.2*  PHOS 4.1 4.7*   Liver Function Tests: Recent Labs  Lab 03/26/23 0557 03/27/23 0525  ALBUMIN 2.2* 2.2*   No results for input(s): "LIPASE", "AMYLASE" in the last 168 hours. CBC: Recent Labs  Lab 03/25/23 0030 03/25/23 1053 03/26/23 0557 03/27/23 0525  WBC 9.1 8.6 7.8 7.7  NEUTROABS  --   --   --  5.8  HGB 9.4* 9.6* 9.9* 9.5*  HCT 31.1* 31.1* 32.9* 31.3*  MCV 96.0 94.2 96.5 94.3  PLT 209 175 194 184   Blood Culture    Component Value Date/Time   SDES BLOOD RIGHT HAND 12/11/2020  1151   SPECREQUEST  12/11/2020 1151    BOTTLES DRAWN AEROBIC AND ANAEROBIC Blood Culture adequate volume   CULT  12/11/2020 1151    NO GROWTH 5 DAYS Performed at [facility], [address] [place], [place] [address]    REPTSTATUS 12/16/2020 FINAL 12/11/2020 1151    Cardiac Enzymes: No results for input(s): "CKTOTAL", "CKMB", "CKMBINDEX", "TROPONINI" in the last 168 hours. CBG: Recent Labs  Lab 03/26/23 1604 03/26/23 2119 03/26/23 2224 03/27/23 0304 03/27/23 0659  GLUCAP 195* 70 82 123* 104*   Iron Studies: No results for input(s): "IRON", "TIBC", "TRANSFERRIN", "FERRITIN" in the last 72 hours. Lab Results  Component Value Date   INR 3.7 (H) 03/27/2023   INR 2.9 (H) 03/26/2023   INR 2.5 (H) 03/25/2023   Studies/Results: DG Chest 2 View  Result Date: 03/27/2023 CLINICAL DATA:  Shortness of breath. EXAM: CHEST - 2 VIEW COMPARISON:  CTA chest 12/08/2020.  One-view chest x-ray 12/08/2020. FINDINGS: The heart is enlarged. Monitoring device is in place. Atherosclerotic calcifications are present at the aortic arch. Aortic valve is noted. A right pleural effusion is present. Asymmetric interstitial edema is present on the right. Dependent airspace disease is noted in the lower lobe. Mild pulmonary vascular congestion  is present on the left without focal airspace disease. Surgical clips are present in the right axilla. The visualized soft tissues and bony thorax are otherwise unremarkable. IMPRESSION: 1. Cardiomegaly with asymmetric interstitial edema and right pleural effusion compatible with congestive heart failure. 2. Dependent airspace disease in the right lower lobe likely reflects atelectasis. Infection is not excluded. Electronically Signed   By: Marin Roberts M.D.   On: 03/27/2023 10:41    Medications:   amiodarone  200 mg Oral Daily   aspirin EC  81 mg Oral Daily   atomoxetine  18 mg Oral Daily   cinacalcet  60 mg Oral Q M,W,F   darbepoetin (ARANESP) injection  - DIALYSIS  60 mcg Subcutaneous Q Fri-1800   droxidopa  500 mg Oral TID WC   heparin  3,000 Units Dialysis Once in dialysis   insulin aspart  0-5 Units Subcutaneous QHS   insulin aspart  0-6 Units Subcutaneous TID WC   insulin aspart  12 Units Subcutaneous QPC breakfast   insulin aspart  15 Units Subcutaneous BID AC   melatonin  10 mg Oral QHS   midodrine  20 mg Oral TID WC   multivitamin  1 tablet Oral QHS   nutrition supplement (JUVEN)  1 packet Oral BID BM   pantoprazole  40 mg Oral BID   simethicone  80 mg Oral QID   Vitamin D (Ergocalciferol)  50,000 Units Oral Q7 days   warfarin  3 mg Oral q1600   Warfarin - Pharmacist Dosing Inpatient   Does not apply q1600    Dialysis Orders: MWF SW 3h  400/1.5  69.5kg 2K/2Ca bath  Heparin 3000  AVF LUA - last OP HD 3/13, post wt 69.5kg - sensipar  po tiw - hectorol 3 mcg IV tiw - venofer 50 mg IV weekly  61yo F s/p [facility] admit 3/13-4/16/24 for MV replacement, TV ring, AV decalcification with removal of LV mass. Post-op, she have A-fib and CHB, underwent leadless PPM on 02/26/23. Hospitalization c/b hypotension, on midodrine + Strattera + Droxidopa. Will be on warfarin + ASA for coagulation. Transferred to CIR for rehab.   Assessment/Plan: 1. Valvular heart disease s/p MVR, TV ring, AoV decalcification with removal LV mass 02/18/23: On warfarin for anticoagulation. 2. ESRD: Will continue outpatient HD schedule - next HD this afternoon. She refuses to run > 3hr treatment, refusing extra HD for volume removal-I discussed with her again on doing an extra HD and she firmly refused.  3. HYPOTN/volume: On midodrine  TID, droxidopa  TID, and atomoxetine  daily for hypotension. She is very edematous - will see what we can get off with dialysis, hopefully start slowly getting her weight down. Per bed weights, she is actually up today despite HD yesterday. 4. Anemia of ESRD: Hgb 9.9 - hard to tell when last ESA was given, starting low dose  Aranesp today. 5. Secondary hyperparathyroidism:  Ca/Phos pending, on cinacalcet  TIW. Wait to see labs before resuming VDRA and/or binders. 6. Nutrition:  On protein supplements 7. T2DM: On insulin, per primary. 8. A-fib: On amiodarone. 9. Debility: In CIR - follow progress.  [name], NP [facility] 03/27/2023,11:16 AM  LOS: 3 days

## 2023-03-27 NOTE — Progress Notes (Signed)
POST HD TX NOTE  03/27/23 1652  Vitals  Temp (!) 96.7 F (35.9 C)  Temp Source Axillary  BP (!) 97/47  MAP (mmHg) (!) 64  BP Location Right Arm  BP Method Automatic  Patient Position (if appropriate) Lying  Pulse Rate 89  Pulse Rate Source Monitor  ECG Heart Rate 90  Resp (!) 26  Oxygen Therapy  SpO2 100 %  O2 Device Nasal Cannula  O2 Flow Rate (L/min) 2.5 L/min  Pulse Oximetry Type Continuous  During Treatment Monitoring  Intra-Hemodialysis Comments (S)   (post HD tx VS check)  Post Treatment  Dialyzer Clearance Heavily streaked  Hemodialysis Intake (mL) 2 mL (1hr 52 min)  Liters Processed 44.8  Fluid Removed (mL) 1500 mL  Tolerated HD Treatment Yes  Post-Hemodialysis Comments (S)  tx ended 1 hr and 8 min early d/t sytem was clotting, TMP had increased to -68 and pt stated to end her tx now,she can't clot off. i told her i would rinse her back and re set up to finish the tx and pt refused re start. UF goal not met, blood rinsed back, VSS. Medication Admin: Heparin 3000 units  Fistula / Graft Left Upper arm Arteriovenous fistula  No Placement Date or Time found.   Placed prior to admission: Yes  Orientation: Left  Access Location: Upper arm  Access Type: (c) Arteriovenous fistula  Site Condition No complications  Fistula / Graft Assessment Bruit;[place];Present  Drainage Description None

## 2023-03-27 NOTE — IPOC Note (Signed)
[facility] Patient Details Name: Colleen [name] MRN: [id number] DOB: [birth year]  Admitting Diagnosis: Debility  Hospital Problems: Principal Problem:   Debility Active Problems:   ESRD on dialysis   Pressure injury of skin   S/P MVR (mitral valve repair)     Functional Problem List: Nursing Bowel, Medication Management, Pain, Nutrition, Safety  PT Balance, Behavior, Edema, Endurance, Motor, Nutrition, Perception, Safety, Skin Integrity  OT Balance, Cognition, Edema, Endurance, Motor, Pain, Safety, Vision, Skin Integrity, Sensory  SLP    TR         Basic ADL's: OT Grooming, Bathing, Dressing, Toileting     Advanced  ADL's: OT       Transfers: PT Bed Mobility, Bed to Chair, Car, Occupational psychologist, Research scientist (life sciences): PT Ambulation, Psychologist, prison and probation services, Stairs     Additional Impairments: OT None  SLP        TR      Anticipated Outcomes Item Anticipated Outcome  Self Feeding Mod I  Swallowing      Basic self-care  Min A  Toileting  Min A   Bathroom Transfers CGA  Bowel/Bladder  manage bowel w mod I; anuric W ESRD/HD  Transfers  supervision with LRAD  Locomotion  supervision with LRAD  Communication     Cognition     Pain  < 4 with prns  Safety/Judgment  manage w cues   Therapy Plan: PT Intensity: Minimum of 1-2 x/day ,45 to 90 minutes PT Frequency: 5 out of 7 days PT Duration Estimated Length of Stay: 2.5 weeks OT Intensity: Minimum of 1-2 x/day, 45 to 90 minutes OT Frequency: 5 out of 7 days OT Duration/Estimated Length of Stay: 2-3 weeks     Team Interventions: Nursing Interventions Disease Management/Prevention, Medication Management, Discharge Planning, Bowel Management, Patient/Family Education  PT interventions Ambulation/gait training, Discharge planning, Functional mobility training, Psychosocial support, Therapeutic Activities, Balance/vestibular training, Disease management/prevention, Neuromuscular  re-education, Skin care/wound management, Therapeutic Exercise, Wheelchair propulsion/positioning, Cognitive remediation/compensation, DME/adaptive equipment instruction, Pain management, Community reintegration, Equities trader education, Splinting/orthotics, UE/LE Strength taining/ROM, Museum/gallery curator, UE/LE Coordination activities  OT Interventions Warden/ranger, Fish farm manager, Patient/family education, Therapeutic Activities, Wheelchair propulsion/positioning, Cognitive remediation/compensation, Psychosocial support, Therapeutic Exercise, Functional mobility training, Self Care/advanced ADL retraining, UE/LE Strength taining/ROM, Discharge planning, Neuromuscular re-education, Skin care/wound managment, UE/LE Coordination activities, Disease mangement/prevention, Pain management, Visual/perceptual remediation/compensation  SLP Interventions    TR Interventions    SW/CM Interventions Discharge Planning, Psychosocial Support, Patient/Family Education, Disease Management/Prevention   Barriers to Discharge MD  Medical stability  Nursing Decreased caregiver support, Hemodialysis, Home environment access/layout 1 level flight of steps to entry of apt solo; friend and brother to assist prn  PT Decreased caregiver support, Wound Care, Hemodialysis, Weight bearing restrictions, New oxygen, Other (comments) sternal precautions, supplemental O2, fear/anxiety, lives alone  OT Lack of/limited family support, Decreased caregiver support, Home environment access/layout, Incontinence, Hemodialysis, New oxygen    SLP      SW       Team Discharge Planning: Destination: PT-Home ,OT- Home , SLP-  Projected Follow-up: PT-Home health PT, OT-  Home health OT, 24 hour supervision/assistance, SLP-  Projected Equipment Needs: PT-To be determined, OT- To be determined, SLP-  Equipment Details: PT-has none, OT-  Patient/family involved in discharge planning: PT- Patient, Family  member/caregiver,  OT-Patient, SLP-   MD ELOS: 10-14 days Medical Rehab Prognosis:  Excellent Assessment: The patient has been admitted for CIR therapies with the diagnosis of debility.  The team will be addressing functional mobility, strength, stamina, balance, safety, adaptive techniques and equipment, self-care, bowel and bladder mgt, patient and caregiver education. Goals have been set at supervision/MinA. Anticipated discharge destination is home.        See Team Conference Notes for weekly updates to the plan of care

## 2023-03-27 NOTE — Progress Notes (Signed)
Physical Therapy Session Note  Patient Details  Name: Colleen [name] MRN: [id number] Date of Birth: [birth year]  Today's Date: 03/27/2023 PT Individual Time: 4098-1191 + 4782-9562 PT Individual Time Calculation (min): 17 min + 39 min  Short Term Goals: Week 1:  PT Short Term Goal 1 (Week 1): pt will transfer sit<>stand with LRAD and min A PT Short Term Goal 2 (Week 1): pt will transfer bed<>chair with LRAD and min A PT Short Term Goal 3 (Week 1): pt will ambulate 78ft with LRAD and min A  Skilled Therapeutic Interventions/Progress Updates:    Session 1:  Pt presents in room in [facility], still eating breakfast however reporting does not usually eat much in mornings. Pt denies pain at this time and agreeable to PT. Therapist spends time educating on SpO2 stats with pt demonstrating SpO2 100% at rest while sitting in WC on 2L supplemental O2. Upon exit from room xray tech presents to take pt to xray. Session ended with pt seated in [facility] handoff to xray. Pt missing of session.  Session 2: Therapist returns to make up missed minutes from earlier session with pt agreeable to PT. This session focused on transfer training and gait training for improved tolerance to upright as well as dynamic standing balance with functional mobility. Pt transported to therapy gym dependently for time management.  Pt completes x2 sit<>stands from [facility] with mod assist for gluteal clearance, cues for hand placement for maintenance of sternal precautions. ambulates 2x28' with RW CGA, therapist managing O2, extended seated rest break due to fatigue, denies SOB, SpO2 92% following ambulation, returns to 96% with seated break.  Pt then ambulates 69' with two turn CGA with one posterior LOB requiring min assist to correct, pt with quickened gait speed at end of ambulation trial due to pt reporting need to use bathroom. Pt transported back to room dependently in WC.  Pt completes stand pivot transfer with mod assist to  RW, CGA for pivot to [facility] placed over toilet. Pt remains standing and manages doffing pants and briefs without UE support CGA for standing balance, cues for participation. Pt remains seated with distant supervision for BM, continent, charted. Pt requires total assist for periarea hygiene in standing, therapist provides max assist for managing brief and pants over hips due to position on floor. Pt completes pivot back to WC with RW CGA.  Pt handoff to OT at end of session in [facility] in room on 2L O2.  Therapy Documentation Precautions:  Precautions Precautions: Sternal, Fall, ICD/Pacemaker Precaution Comments: Anxious. Monitor (O2 > 90%), and HR Restrictions Weight Bearing Restrictions: No Other Position/Activity Restrictions: sternal precautions  Therapy/Group: Individual Therapy  Edwin Cap 03/27/2023, 12:25 PM

## 2023-03-28 DIAGNOSIS — E8779 Other fluid overload: Secondary | ICD-10-CM | POA: Diagnosis not present

## 2023-03-28 DIAGNOSIS — G479 Sleep disorder, unspecified: Secondary | ICD-10-CM | POA: Diagnosis not present

## 2023-03-28 DIAGNOSIS — Z992 Dependence on renal dialysis: Secondary | ICD-10-CM

## 2023-03-28 DIAGNOSIS — N186 End stage renal disease: Secondary | ICD-10-CM | POA: Diagnosis not present

## 2023-03-28 DIAGNOSIS — R5381 Other malaise: Secondary | ICD-10-CM | POA: Diagnosis not present

## 2023-03-28 LAB — GLUCOSE, CAPILLARY
Glucose-Capillary: 175 mg/dL — ABNORMAL HIGH (ref 70–99)
Glucose-Capillary: 208 mg/dL — ABNORMAL HIGH (ref 70–99)
Glucose-Capillary: 231 mg/dL — ABNORMAL HIGH (ref 70–99)
Glucose-Capillary: 36 mg/dL — CL (ref 70–99)
Glucose-Capillary: 377 mg/dL — ABNORMAL HIGH (ref 70–99)
Glucose-Capillary: 60 mg/dL — ABNORMAL LOW (ref 70–99)
Glucose-Capillary: 75 mg/dL (ref 70–99)
Glucose-Capillary: 97 mg/dL (ref 70–99)

## 2023-03-28 LAB — PROTIME-INR
INR: 4.3 (ref 0.8–1.2)
Prothrombin Time: 41.1 seconds — ABNORMAL HIGH (ref 11.4–15.2)

## 2023-03-28 LAB — CBC
HCT: 32.5 % — ABNORMAL LOW (ref 36.0–46.0)
Hemoglobin: 10.2 g/dL — ABNORMAL LOW (ref 12.0–15.0)
MCH: 28.8 pg (ref 26.0–34.0)
MCHC: 31.4 g/dL (ref 30.0–36.0)
MCV: 91.8 fL (ref 80.0–100.0)
Platelets: 179 10*3/uL (ref 150–400)
RBC: 3.54 MIL/uL — ABNORMAL LOW (ref 3.87–5.11)
RDW: 18.5 % — ABNORMAL HIGH (ref 11.5–15.5)
WBC: 7.6 10*3/uL (ref 4.0–10.5)
nRBC: 0.8 % — ABNORMAL HIGH (ref 0.0–0.2)

## 2023-03-28 MED ORDER — INSULIN ASPART 100 UNIT/ML IJ SOLN
10.0000 [IU] | Freq: Two times a day (BID) | INTRAMUSCULAR | Status: DC
  Administered 2023-03-29 – 2023-03-30 (×2): 10 [IU] via SUBCUTANEOUS

## 2023-03-28 MED ORDER — FAMOTIDINE 20 MG PO TABS
20.0000 mg | ORAL_TABLET | Freq: Two times a day (BID) | ORAL | Status: DC
  Administered 2023-03-28 – 2023-03-29 (×3): 20 mg via ORAL
  Filled 2023-03-28 (×3): qty 1

## 2023-03-28 NOTE — Progress Notes (Signed)
Hypoglycemic Event  CBG: 60  Treatment: 4 oz juice/soda/ graham crackers  Symptoms: None  Follow-up CBG: Time:1653  CBG Result:75  Possible Reasons for Event: Inadequate meal intake and Medication regimen:    Comments/MD notified: followed protocol ; we'll let MD be aware.

## 2023-03-28 NOTE — Progress Notes (Signed)
Occupational Therapy Session Note  Patient Details  Name: Colleen [name] MRN: [id number] Date of Birth: 09-Aug-1961  Today's Date: 03/28/2023 OT Individual Time: 4098-1191 & 1300-1355 OT Individual Time Calculation (min): 55 min & 55 min   Short Term Goals: Week 1:  OT Short Term Goal 1 (Week 1): Pt will complete 1/3 toileting steps with CGA OT Short Term Goal 2 (Week 1): Pt will complete sit > stand in prep for ADL with max A using LRAD OT Short Term Goal 3 (Week 1): Pt will complete transfer with mod A of 1 using LRAD to promote OOB toileting  Skilled Therapeutic Interventions/Progress Updates:  Session 1 Skilled OT intervention completed with focus on ADL retraining and functional transfers. Pt received upright in bed in a panic as she couldn't locate her [place], with 82% SPO2 and 118 HR reading. Immediately donned [place] on 2L, with rebound to 98% with OT able to titrate pt down to 1.5 L during session even with activity, and able to keep pt on 1.5 at end of session with SPO2 at 93%. Pt agreeable to session. No pain reported.  Pt politely declined bathing, but agreeable to get ready for the morning. Transitioned to EOB with supervision, cues for having one hand in her line of sight at all times to ensure sternal precautions per pt inquiry. Sat EOB with supervision for OT to donn knee high TEDs with total A. Donned button down shirt with min A only for wrapping around her back, but pt able to manage all buttons today. Able to thread B feet with cues with supervision. Stood with CGA, using RW then able to maintain static balance with supervision using RW during total A threading of brief, then donned pants over hips with mod A. CGA stand pivot to w/c using RW!  Transported pt to bathroom for trial for BM. Cues needed for using 1 hand on grab bar vs 2, then pt able to stand with light min A, then CGA stand pivot to [facility]. Despite time offered, pt unable to have BM. Small smear with CGA posterior wiping  once education was provided to pt about clearance to use 1 hand just not 2 behind back to self-wipe. Donned brief/pants with mod A, then CGA stand pivot to w/c. Pt remained seated in w/c with BLE elevated, with belt alarm on/activated, sister present visiting and with all needs in reach at end of session.  Session 2 Skilled OT intervention completed with focus on activity tolerance, dynamic balance without UE support. Pt received seated in w/c, agreeable to session. No pain reported.  Pt politely declined toileting and other self-care needs. Transported dependently in w/c <> gym for time.   Pt participated in dynamic balance activity at window seal with pt self-initiating wearing new sunglasses for prevention of eye pain from brightness. Required initial min A to stand without AD with mod verbal cues about using 1 hand to push and other on window seal for steadying. Able to maintain dynamic standing balance for > 5 mins each trial to place/retrieve squigz from window seal. Pt did seek holding onto external support but when cued not to, pt had no decrease in balance. Extended rest breaks needed for fatigue, with slight desat to 88% SPO2 on 1.5 L but rebounded to 90% after time. Standing faded to CGA with increased cues with same technique.  OT offered pt's favorite genre during session, with pt requesting specific songs and expressing emotional feelings about her current state. OT offered emotional  support and therapeutic active listening. Back in room, pt remained seated in w/c with family present, belt alarm on and all needs in reach at end of session.  Vitals -Received on 0.5L via [place], SPO2 77% -95% after increasing to 1.5 L and remained for duration/at conclusion of session  Therapy Documentation Precautions:  Precautions Precautions: Sternal, Fall, ICD/Pacemaker Precaution Comments: Anxious. Monitor (O2 > 90%), and HR Restrictions Weight Bearing Restrictions: No Other Position/Activity  Restrictions: sternal precautions    Therapy/Group: Individual Therapy  Melvyn Novas, MS, OTR/L  03/28/2023, 3:30 PM

## 2023-03-28 NOTE — Progress Notes (Signed)
Patient having intermittent episodes of tearfulness this evening and through the current duration of the night. Demonstrating irritability and frustration. Pressure injury causing discomfort and pain for patient. Not wanting any pain medication at this time. Repositioned multiple times throughout the evening. Rotating from left side to right with pillow off loading patient coccyx area to relieve pain in area causing discomfort. Repositioned in bed. Explained to her that her current position will exacerbate the pain due to putting increase pressure on the site due to degree of HOB and sitting upright. Laying the [facility] further back causing an increase in anxiety for patient as also causing dyspnea. Sacrum foam pad changed and area cleansed as well. When moving in bed needing to be lifted up with the chuck pad due to friction from sliding causing increase pain for her.  Chest incision site cleansed with saline wipes due to area looking red and raised at the superior site of the incision area tonight.  Offered patient if any need for lotion this evening due to increase sensation of being dry and causing her to itch. Refused at this time.  Legs elevated in bed as well as upper right extremity. Continue to encourage patient to use the pillow to cough and demonstrated how to use the pillow. Explained importance of bracing her chest with pillow when coughing.

## 2023-03-28 NOTE — Progress Notes (Signed)
Physical Therapy Session Note  Patient Details  Name: Colleen [name] MRN: [id number] Date of Birth: 1961-09-10  Today's Date: 03/28/2023 PT Individual Time: 1102-1202 PT Individual Time Calculation (min): 60 min   Short Term Goals: Week 1:  PT Short Term Goal 1 (Week 1): pt will transfer sit<>stand with LRAD and min A PT Short Term Goal 2 (Week 1): pt will transfer bed<>chair with LRAD and min A PT Short Term Goal 3 (Week 1): pt will ambulate 9ft with LRAD and min A  Skilled Therapeutic Interventions/Progress Updates:  Patient greeted sitting upright in wheelchair in room with sister and mother present- Patient agreeable to PT treatment session and denied pain throughout session. Patient wheeled to day room for time management and energy conservation. Patient on 1L oxygen via [place] throughout treatment session with therapist managing oxygen tank and line.   Patient performed x5 sit/stands with the use of a RW and MinA- VC for scooting her bottom forward and increased anterior weight shift throughout with good improvements noted, however fatigued by the fifth trial.   Patient gait trained x54' with RW and CGA/MinA- Patient demonstrated x1 minor LOB requiring MinA for steadying secondary to impaired and delayed righting reactions.   Patient requesting to use the restroom in her room- Patient wheeled back to her room for time management and performed sit/stands and stand pivot with RW and CGA/MinA for initiating standing. Patient with continent BM and was able to initiate pericare, however required therapist to finish pericare and assist with brief and pants management.   Patient wheeled outside for improved affect/mood with fresh air and sunshine- Therapist located sunglasses in order to protect her eyes from the sun as patient states she is "sensitive." While outside, patient performed various LE exercises in order to increase strength and ROM-  -B LAQ, x10 each LE -Alternating marches (hip  flexion), x10 each LE -Ankle pumps, x20 each LE  Patient propelled manual wheelchair ~15' with B UE (small propulsion and close to her body) in order to strengthen B UE.   Patient returned to her room and left sitting upright in wheelchair with call bell within reach, chair alarm on and all needs met.    Therapy Documentation Precautions:  Precautions Precautions: Sternal, Fall, ICD/Pacemaker Precaution Comments: Anxious. Monitor (O2 > 90%), and HR Restrictions Weight Bearing Restrictions: No Other Position/Activity Restrictions: sternal precautions  Therapy/Group: Individual Therapy  Spence Soberano 03/28/2023, 7:56 AM

## 2023-03-28 NOTE — Progress Notes (Signed)
Epistaxis occurring again. Patient endorses feeling anxious. Continues to endorse "not feeling right". After being repositioned some improvement, but continues to be anxious. Encouraged her to slow her breathing take in deeper breaths. Normal rate of RR pattern is shallow breaths. With taking better inhalation anxiety appearing to wane.

## 2023-03-28 NOTE — Progress Notes (Signed)
Hypoglycemic Event  CBG: 36  Treatment: 4 oz juice/soda/ graham crackers  Symptoms: None  Follow-up CBG: Time:1625 CBG Result:60  Possible Reasons for Event: Medication regimen:    Comments/MD notified:followed protocol; we'll recheck at 1645

## 2023-03-28 NOTE — Progress Notes (Signed)
Epistaxis noted from pt left nostril at beginning of shift. Pressure applied x5 minutes, bleeding subsided. Pt educated on care of nose bleed. Pt needs reinforcement.  Mylo Red, LPN

## 2023-03-28 NOTE — Progress Notes (Signed)
Physical Therapy Session Note  Patient Details  Name: Colleen [name] MRN: [id number] Date of Birth: July 28, 1961  Today's Date: 03/28/2023 PT Individual Time: 1445-1530 PT Individual Time Calculation (min): 45 min   Short Term Goals: Week 1:  PT Short Term Goal 1 (Week 1): pt will transfer sit<>stand with LRAD and min A PT Short Term Goal 2 (Week 1): pt will transfer bed<>chair with LRAD and min A PT Short Term Goal 3 (Week 1): pt will ambulate 26ft with LRAD and min A  Skilled Therapeutic Interventions/Progress Updates:  Pt was seen bedside in the pm. Pt transported to gym. Pt performed sit to stand with min to mod A and verbal cues. Pt ambulated 55 feet with rolling walker and min A with multiple rest breaks. Pt performed B hip flex exercises and blocked practice sit to stand 2 reps x 2. Increased work of breathing with any activity. Pt returned to room. Pt transferred sit to stand with rolling walker and min to mod A. Pt ambulated about 5 feet to edge of bed with min A and verbal cues. Pt transferred edge of bed to supine with min to mod A and verbal cues. Pt left sitting up in bed with all needs within reach.   Therapy Documentation Precautions:  Precautions Precautions: Sternal, Fall, ICD/Pacemaker Precaution Comments: Anxious. Monitor (O2 > 90%), and HR Restrictions Weight Bearing Restrictions: No Other Position/Activity Restrictions: sternal precautions General:   Pain: No c/o pain.     Therapy/Group: Individual Therapy  Rayford Halsted 03/28/2023, 3:57 PM

## 2023-03-28 NOTE — Progress Notes (Signed)
PROGRESS NOTE   Subjective/Complaints: Still having insomnia. ?anxiety at night. ? Sob. Unhappy about nursing care in evenings. Feels that they take too long to address her needs  ROS: Patient denies fever, rash, sore throat, blurred vision, dizziness, nausea, vomiting, diarrhea, cough, chest pain, joint or back/neck pain, headache, or mood change.    Objective:   DG Chest 2 View  Result Date: 03/27/2023 CLINICAL DATA:  Shortness of breath. EXAM: CHEST - 2 VIEW COMPARISON:  CTA chest 12/08/2020.  One-view chest x-ray 12/08/2020. FINDINGS: The heart is enlarged. Monitoring device is in place. Atherosclerotic calcifications are present at the aortic arch. Aortic valve is noted. A right pleural effusion is present. Asymmetric interstitial edema is present on the right. Dependent airspace disease is noted in the lower lobe. Mild pulmonary vascular congestion is present on the left without focal airspace disease. Surgical clips are present in the right axilla. The visualized soft tissues and bony thorax are otherwise unremarkable. IMPRESSION: 1. Cardiomegaly with asymmetric interstitial edema and right pleural effusion compatible with congestive heart failure. 2. Dependent airspace disease in the right lower lobe likely reflects atelectasis. Infection is not excluded. Electronically Signed   By: Marin Roberts M.D.   On: 03/27/2023 10:41   Recent Labs    03/27/23 0525 03/28/23 0610  WBC 7.7 7.6  HGB 9.5* 10.2*  HCT 31.3* 32.5*  PLT 184 179   Recent Labs    03/26/23 0557 03/27/23 0525  NA 131* 132*  K 4.1 4.3  CL 91* 93*  CO2 26 25  GLUCOSE 216* 120*  BUN 28* 34*  CREATININE 4.22* 5.30*  CALCIUM 7.8* 8.2*    Intake/Output Summary (Last 24 hours) at 03/28/2023 1039 Last data filed at 03/28/2023 0730 Gross per 24 hour  Intake 237 ml  Output 1500 ml  Net -1263 ml     Pressure Injury 03/24/23 Buttocks Left Stage 2 -   Partial thickness loss of dermis presenting as a shallow open injury with a red, pink wound bed without slough. (Active)  03/24/23 1500  Location: Buttocks  Location Orientation: Left  Staging: Stage 2 -  Partial thickness loss of dermis presenting as a shallow open injury with a red, pink wound bed without slough.  Wound Description (Comments):   Present on Admission: Yes    Physical Exam: Vital Signs Blood pressure (!) 96/56, pulse (!) 112, temperature 97.8 F (36.6 C), temperature source Axillary, resp. rate 17, height 5' 3.86" (1.622 m), weight 77 kg, last menstrual period 12/08/2013, SpO2 100 %. Constitutional:      Appearance: Normal appearance.     Comments: 2L Oxygen per [place]  HENT:     Head: Normocephalic.     Comments: R facial swelling and R facial droop- no change Tongue midline    Right Ear: External ear normal.     Left Ear: External ear normal.     Nose: Nose normal. No congestion.     Mouth/Throat:     Mouth: Mucous membranes are dry.     Pharynx: Oropharynx is clear. No oropharyngeal exudate.  Eyes:     General:        Right eye: No discharge.  Left eye: No discharge.     Extraocular Movements: Extraocular movements intact.  Cardiovascular:     Rate and Rhythm: Tachycardia present. Rhythm irregular.     Heart sounds: Normal heart sounds.     No gallop.     Comments: Sternal incision and upper abd incision with sutures in place Pulmonary:     Comments: decreased bs at bases. Occ wheezes Abdominal:     General: Bowel sounds are normal. There is no distension.     Palpations: Abdomen is soft.     Tenderness: There is no abdominal tenderness.  Musculoskeletal:     Cervical back: Neck supple. No tenderness.     Comments: Ue's 5-/5 B/L HF/KE 3+/5 B/L; DF/PF 4+/5 B/L  Skin:    General: Skin is warm and dry.     Comments: 2-3+ edema BLE- and some wooding in hands and arms- from reduced swelling L buttock pressure ulcer- Stage II- ~1.5 x 0.5 cm- pink-  slightly open Incision closed on R chest and L chest from prior HD catheters Fistula L upper arm- (+) thrill  +LE edema Neurological:     Mental Status: She is alert.     Comments: Right facial weakness without dysarthria. Speech slow but clear. Oriented X 4. Intact to light touch in all 4 extremities per pt  Fully oriented  Psychiatric:     Intermittently anxious    Assessment/Plan: 1. Functional deficits which require 3+ hours per day of interdisciplinary therapy in a comprehensive inpatient rehab setting. Physiatrist is providing close team supervision and 24 hour management of active medical problems listed below. Physiatrist and rehab team continue to assess barriers to discharge/monitor patient progress toward functional and medical goals  Care Tool:  Bathing    Body parts bathed by patient: Right arm, Left arm, Chest, Abdomen, Face, Right upper leg, Left upper leg   Body parts bathed by helper: Front perineal area, Buttocks, Right lower leg, Left lower leg     Bathing assist Assist Level: Supervision/Verbal cueing     Upper Body Dressing/Undressing Upper body dressing   What is the patient wearing?: Button up shirt    Upper body assist Assist Level: Minimal Assistance - Patient > 75%    Lower Body Dressing/Undressing Lower body dressing      What is the patient wearing?: Incontinence brief, Pants     Lower body assist Assist for lower body dressing: Moderate Assistance - Patient 50 - 74%     Toileting Toileting    Toileting assist Assist for toileting: Moderate Assistance - Patient 50 - 74%     Transfers Chair/bed transfer  Transfers assist  Chair/bed transfer activity did not occur: Safety/medical concerns (weakness, fear)  Chair/bed transfer assist level: Minimal Assistance - Patient > 75%     Locomotion Ambulation   Ambulation assist   Ambulation activity did not occur: Safety/medical concerns (fatigue, fear, generalized weakness)  Assist  level: Minimal Assistance - Patient > 75% Assistive device: Walker-rolling Max distance: 11ft   Walk 10 feet activity   Assist  Walk 10 feet activity did not occur: Safety/medical concerns (fatigue, fear, generalized weakness)  Assist level: Moderate Assistance - Patient - 50 - 74% Assistive device: Walker-rolling   Walk 50 feet activity   Assist Walk 50 feet with 2 turns activity did not occur: Safety/medical concerns (fatigue, fear, generalized weakness)  Assist level: Moderate Assistance - Patient - 50 - 74% Assistive device: Walker-rolling    Walk 150 feet activity   Assist Walk  150 feet activity did not occur: Safety/medical concerns (fatigue, fear, generalized weakness)         Walk 10 feet on uneven surface  activity   Assist Walk 10 feet on uneven surfaces activity did not occur: Safety/medical concerns (fatigue, fear, generalized weakness)         Wheelchair     Assist Is the patient using a wheelchair?: Yes Type of Wheelchair: Manual Wheelchair activity did not occur: Safety/medical concerns (fatigue, fear, generalized weakness)         Wheelchair 50 feet with 2 turns activity    Assist    Wheelchair 50 feet with 2 turns activity did not occur: Safety/medical concerns (fatigue, fear, generalized weakness)       Wheelchair 150 feet activity     Assist  Wheelchair 150 feet activity did not occur: Safety/medical concerns (fatigue, fear, generalized weakness)       Blood pressure (!) 96/56, pulse (!) 112, temperature 97.8 F (36.6 C), temperature source Axillary, resp. rate 17, height 5' 3.86" (1.622 m), weight 77 kg, last menstrual period 12/08/2013, SpO2 100 %.    Medical Problem List and Plan: 1. Functional deficits secondary to debility after MV repair with sternal precautions             -patient may  shower if cover incisions- HD catheters in chest are out- also has sternal incisions that need to be covered              -ELOS/Goals: 10-14 days supervision to min A  --Continue CIR therapies including PT, OT  2.  Mechanical MVR: INR therapeutic and heparin drip d/ced.  Pharmaceutical: Coumadin and Lovenox             -antiplatelet therapy: ASA 3. Pain Management:  Tylenol prn. Lidocaine patches prn. Denies pain 4. Insomnia: increase melatonin to 10mg  HS 4/20  -I asked charge nurse to speak with her about PM nursing care.  5. Anxiety: neuropsych consulted 6. Skin/Wound Care: Routine pressure relief measures with frequent pressure relief measures for sacral decub.             --Interdry to skin folds.              --Add vitamin C and Zinc to promote wound healing 7. Fluids/Electrolytes/Nutrition: Strict I/O. Labs with HD.  --Will keep diet as regular to help with food choices             --Juven and nephro added. 8. MVR/TVR by Dr. Felipa Furnace [phone]): Continue Sternal precautions.              --PPM for CHB             --coumadin with INR goal 2.5-3.5 9.  Hypotension: Monitor for symptoms with increase in activity             --on midodrine, Staterra and Droxidopa.  10.  ESRD: Schedule HD at the end of the day. Renal diet with 1200 cc FR.  11. T2DM: Monitor BS 5 X day with novolog 12 units w/breakfast and 15 units BID. Provided list of foods that are good for diabetes 12. A fib: Monitor HR TID. No BB due to hypotension. HR continues to be elevated, continue to monitor             --on coumadin. 13. Anemia of chronic disease: Monitor H/H with HD labs/             --transfuse prn Hgb <7.0 or if symptomatic.  14. Anxiety- pt cannot tolerate door/curtain closed 15. New O2 requirement with mild tachypnea/volume overload -  -volume mgt per nephrology with HD  -continue oxygen as needed 16. Overweight: provide dietary education 17. Vitamin D insufficiency: start ergocalciferol 50,000U once per week for 7 weeks 18. Shortness of breath: improved, reviewed CXR results with her that show pleural effusion and  atelectasis 19. Hypotension: will not add metoprolol, teds ordered 20. LE edema: teds ordered 21. Atelectasis: incentive spirometer ordered     LOS: 4 days A FACE TO FACE EVALUATION WAS PERFORMED  Ranelle Oyster 03/28/2023, 10:39 AM

## 2023-03-28 NOTE — Progress Notes (Signed)
[facility] Progress Note   Subjective:    Seen and examined patient at bedside. Patient's Mother also at bedside. Unfortunately, HD ended 1 hour early 2nd machine clotting off. She was already on Heparin 3000 unit bolus. Removed 1.5L yesterday. Remains overloaded but not in acute distress. She continues to refuse extra HD treatment.   Objective Vitals:   03/28/23 0019 03/28/23 0320 03/28/23 0521 03/28/23 0826  BP: (!) 100/54 (!) 97/58  (!) 96/56  Pulse: 90 (!) 104  (!) 112  Resp: Temp: (!) 97.3 F (36.3 C) 97.9 F (36.6 C)  97.8 F (36.6 C)  TempSrc: Axillary Axillary  Axillary  SpO2: 100% 98%  100%  Weight:   77 kg   Height:       Physical Exam General: 62yo F, NAD but breathing is mildly labored when talking. Nasal O2 in place Heart:RRR; no murmur; healing mid-line chest incision Lungs:Bibasilar rales present, clear in upper lobes Abdomen: soft Extremities: 2+ BLE edema and 2+ RUE edema Dialysis Access: LUE AVF + bruit  Filed Weights   03/27/23 1408 03/27/23 1652 03/28/23 0521  Weight: 78.5 kg 77.1 kg 77 kg    Intake/Output Summary (Last 24 hours) at 03/28/2023 1336 Last data filed at 03/28/2023 1100 Gross per 24 hour  Intake 354 ml  Output 1500 ml  Net -1146 ml    Additional Objective Labs: Basic Metabolic Panel: Recent Labs  Lab 03/26/23 0557 03/27/23 0525  NA 131* 132*  K 4.1 4.3  CL 91* 93*  CO2 26 25  GLUCOSE 216* 120*  BUN 28* 34*  CREATININE 4.22* 5.30*  CALCIUM 7.8* 8.2*  PHOS 4.1 4.7*   Liver Function Tests: Recent Labs  Lab 03/26/23 0557 03/27/23 0525  ALBUMIN 2.2* 2.2*   No results for input(s): "LIPASE", "AMYLASE" in the last 168 hours. CBC: Recent Labs  Lab 03/25/23 0030 03/25/23 1053 03/26/23 0557 03/27/23 0525 03/28/23 0610  WBC 9.1 8.6 7.8 7.7 7.6  NEUTROABS  --   --   --  5.8  --   HGB 9.4* 9.6* 9.9* 9.5* 10.2*  HCT 31.1* 31.1* 32.9* 31.3* 32.5*  MCV 96.0 94.2 96.5 94.3 91.8   PLT 209 175 194 184 179   Blood Culture    Component Value Date/Time   SDES BLOOD RIGHT HAND 12/11/2020 1151   SPECREQUEST  12/11/2020 1151    BOTTLES DRAWN AEROBIC AND ANAEROBIC Blood Culture adequate volume   CULT  12/11/2020 1151    NO GROWTH 5 DAYS Performed at [facility], [address], [place], [place] [address]    REPTSTATUS 12/16/2020 FINAL 12/11/2020 1151    Cardiac Enzymes: No results for input(s): "CKTOTAL", "CKMB", "CKMBINDEX", "TROPONINI" in the last 168 hours. CBG: Recent Labs  Lab 03/27/23 2103 03/28/23 0010 03/28/23 0353 03/28/23 0608 03/28/23 1202  GLUCAP 216* 377* 208* 175* 97   Iron Studies: No results for input(s): "IRON", "TIBC", "TRANSFERRIN", "FERRITIN" in the last 72 hours. Lab Results  Component Value Date   INR 4.3 (HH) 03/28/2023   INR 3.7 (H) 03/27/2023   INR 2.9 (H) 03/26/2023   Studies/Results: DG Chest 2 View  Result Date: 03/27/2023 CLINICAL DATA:  Shortness of breath. EXAM: CHEST - 2 VIEW COMPARISON:  CTA chest 12/08/2020.  One-view chest x-ray 12/08/2020. FINDINGS: The heart is enlarged. Monitoring device is in place. Atherosclerotic calcifications are present at the aortic arch. Aortic valve is noted. A right pleural effusion is present. Asymmetric interstitial edema  is present on the right. Dependent airspace disease is noted in the lower lobe. Mild pulmonary vascular congestion is present on the left without focal airspace disease. Surgical clips are present in the right axilla. The visualized soft tissues and bony thorax are otherwise unremarkable. IMPRESSION: 1. Cardiomegaly with asymmetric interstitial edema and right pleural effusion compatible with congestive heart failure. 2. Dependent airspace disease in the right lower lobe likely reflects atelectasis. Infection is not excluded. Electronically Signed   By: Marin Roberts M.D.   On: 03/27/2023 10:41    Medications:   amiodarone  200 mg Oral Daily   aspirin EC  81  mg Oral Daily   atomoxetine  18 mg Oral Daily   cinacalcet  60 mg Oral Q M,W,F   darbepoetin (ARANESP) injection - DIALYSIS  60 mcg Subcutaneous Q Fri-1800   droxidopa  500 mg Oral TID WC   famotidine  20 mg Oral BID   insulin aspart  0-5 Units Subcutaneous QHS   insulin aspart  0-6 Units Subcutaneous TID WC   insulin aspart  12 Units Subcutaneous QPC breakfast   insulin aspart  15 Units Subcutaneous BID AC   melatonin  10 mg Oral QHS   midodrine  20 mg Oral TID WC   multivitamin  1 tablet Oral QHS   nutrition supplement (JUVEN)  1 packet Oral BID BM   simethicone  80 mg Oral QID   Vitamin D (Ergocalciferol)  50,000 Units Oral Q7 days   warfarin  1 mg Oral q1600   Warfarin - Pharmacist Dosing Inpatient   Does not apply q1600    Dialysis Orders: MWF SW 3h  400/1.5  69.5kg 2K/2Ca bath  Heparin 3000  AVF LUA - last OP HD 3/13, post wt 69.5kg - sensipar  po tiw - hectorol 3 mcg IV tiw - venofer 50 mg IV weekly   62yo F s/p [facility] admit 3/13-4/16/24 for MV replacement, TV ring, AV decalcification with removal of LV mass. Post-op, she have A-fib and CHB, underwent leadless PPM on 02/26/23. Hospitalization c/b hypotension, on midodrine + Strattera + Droxidopa. Will be on warfarin + ASA for coagulation. Transferred to CIR for rehab.    Assessment/Plan: 1. Valvular heart disease s/p MVR, TV ring, AoV decalcification with removal LV mass 02/18/23: On warfarin for anticoagulation. 2. ESRD: Will continue outpatient HD schedule - next HD 4/22. She refuses to run > 3hr treatment, refusing extra HD for volume removal-I discussed with her again on doing an extra HD and she firmly refused.  3. HYPOTN/volume: On midodrine  TID, droxidopa  TID, and atomoxetine  daily for hypotension. She is very edematous - will see what we can get off with dialysis, hopefully start slowly getting her weight down. Per bed weights, she is actually up today despite HD yesterday. 4. Anemia of ESRD: Hgb 9.9 -  hard to tell when last ESA was given, Aranesp 4/19. 5. Secondary hyperparathyroidism:  Corr/Phos at goal, on cinacalcet  TIW. Hold VDRA and binders for now. 6. Nutrition:  On protein supplements 7. T2DM: On insulin, per primary. 8. A-fib: On amiodarone. 9. Debility: In CIR - follow progress.  [name], NP [facility] 03/28/2023,1:36 PM  LOS: 4 days

## 2023-03-28 NOTE — Progress Notes (Signed)
ANTICOAGULATION CONSULT NOTE   Pharmacy Consult for Warfarin , INR goal 2.5-3.5  Indication: Mechanical MVR  Allergies  Allergen Reactions   Farxiga [Dapagliflozin] Other (See Comments)    Cannot take due to kidneys   Hydrocodone Other (See Comments)    "Increase Glucose level," per pt   Kombiglyze Xr [Saxagliptin-Metformin Er] Other (See Comments)    Cannot take due to kidneys    Patient Measurements: Height: 5' 3.86" (162.2 cm) (from [facility] records on 02/18/23) Weight: 77 kg (169 lb 12.1 oz) IBW/kg (Calculated) : 54.37 Heparin Dosing Weight: 70.1 kg   Vital Signs: Temp: 97.9 F (36.6 C) (04/20 0320) Temp Source: Axillary (04/20 0320) BP: 97/58 (04/20 0320) Pulse Rate: 104 (04/20 0320)  Labs: Recent Labs    03/25/23 1053 03/26/23 0557 03/27/23 0525 03/28/23 0610  HGB 9.6* 9.9* 9.5* 10.2*  HCT 31.1* 32.9* 31.3* 32.5*  PLT 175 194 184 179  LABPROT 26.7* 29.8* 36.1* 41.1*  INR 2.5* 2.9* 3.7* 4.3*  HEPARINUNFRC 0.29*  --   --   --   CREATININE  --  4.22* 5.30*  --      Estimated Creatinine Clearance: 11.3 mL/min (A) (by C-G formula based on SCr of 5.3 mg/dL (H)).   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 Then developed CHB and afib post-op and on 3/21 underwent leadless PPM placement.   Pharmacy  consult for IV heparin and Warfarin , goal INR 2.5-3.5 for mechantcal MVR.   She also has a history of DVT/PE. 2016, 2019)  Patient reports she was not taking any anticoagulation prior to the [facility] admission, and it has been years since she has taken Eliquis.   At [facility] she was started on Warfarin .  She received Heparin IV 1300 units/hr 4/13-4/15 then hehparin drip was discontinued on  4/15 (PTT was 51.3 sec on 4/15) , when INR 1.8.  Today the INR = 2.3 (4/16 AM) at [facility].  Goal INR 2.5-3.5 INR drawn  admit 4/16 = 2.2 She received  Warfarin   , , , , 3.5mg  ,3.5mg   from 4/10 -03/23/23 at [facility],  Per [facility] records , Warfarin started ~02/21/23  s/p mechanical MVR 02/19/23.    Amiodarone has potential drug-drug interaction(DDI) w/Warfarin,  to increase hypoprothrombinemic effect of Warfarin. Monitoring this by protime Danae Orleans labs.  Per [facility]  records Amiodarone  started  ~3/21 w/load, Amio  QD since 4/10 and Warfarin started ~02/21/23 at [facility]), thus not a new /additional med.   [facility]  records reviewed, found on warfarin 2.5mg  daily as of 02/21/23 and most recent doses given at [facility]  prior to CIR admit (4/16) were  , , , , 3.5mg  ,3.5mg   from 4/10 -  03/23/23.    Most recent INR trend at [facility]  4/14-4/16/24: INR 1.7-1.8-2.3 / repeat 2.2.  INR  2.4/ repeat 2.5 on 4/17 and today 4/18 INR is 2.9, therapeutic . Goal INR = 2.5-3.5.    Heparin drip discontinued on 4/17 due to therapeutic INR.   4/20 Update: INR is supratherapeutic again today at 4.3. Hgb 10.2, plt 179 - per RN no signs/symptoms of bleeding or bruising. Patient is on amiodarone (PTA medication) and pantoprazole which can both increase INR. Will hold dose today and recheck INR in the morning.  Goal of Therapy:  INR 2.5-3.5   Plan:  Hold warfarin dose on 4/20  Transition from pantoprazole to famotidine for GERD to minimize warfarin interactions Monitor daily INR and CBC Monitor for signs and symptoms of bleeding,  drug-drug interactions  Thank you for allowing pharmacy to be part of this patients care team.  Rockwell Alexandria, PharmD PGY1 Pharmacy Resident 03/28/2023 8:12 AM

## 2023-03-29 DIAGNOSIS — R040 Epistaxis: Secondary | ICD-10-CM

## 2023-03-29 DIAGNOSIS — G479 Sleep disorder, unspecified: Secondary | ICD-10-CM | POA: Diagnosis not present

## 2023-03-29 DIAGNOSIS — E8779 Other fluid overload: Secondary | ICD-10-CM | POA: Diagnosis not present

## 2023-03-29 DIAGNOSIS — R5381 Other malaise: Secondary | ICD-10-CM | POA: Diagnosis not present

## 2023-03-29 LAB — RENAL FUNCTION PANEL
Albumin: 2.3 g/dL — ABNORMAL LOW (ref 3.5–5.0)
Anion gap: 14 (ref 5–15)
BUN: 31 mg/dL — ABNORMAL HIGH (ref 6–20)
CO2: 23 mmol/L (ref 22–32)
Calcium: 7.6 mg/dL — ABNORMAL LOW (ref 8.9–10.3)
Chloride: 92 mmol/L — ABNORMAL LOW (ref 98–111)
Creatinine, Ser: 5.62 mg/dL — ABNORMAL HIGH (ref 0.44–1.00)
GFR, Estimated: 8 mL/min — ABNORMAL LOW (ref >60)
Glucose, Bld: 214 mg/dL — ABNORMAL HIGH (ref 70–99)
Phosphorus: 4.6 mg/dL (ref 2.5–4.6)
Potassium: 3.7 mmol/L (ref 3.5–5.1)
Sodium: 129 mmol/L — ABNORMAL LOW (ref 135–145)

## 2023-03-29 LAB — CBC
HCT: 31.9 % — ABNORMAL LOW (ref 36.0–46.0)
Hemoglobin: 9.7 g/dL — ABNORMAL LOW (ref 12.0–15.0)
MCH: 28.4 pg (ref 26.0–34.0)
MCHC: 30.4 g/dL (ref 30.0–36.0)
MCV: 93.5 fL (ref 80.0–100.0)
Platelets: 192 10*3/uL (ref 150–400)
RBC: 3.41 MIL/uL — ABNORMAL LOW (ref 3.87–5.11)
RDW: 18.6 % — ABNORMAL HIGH (ref 11.5–15.5)
WBC: 7.9 10*3/uL (ref 4.0–10.5)
nRBC: 1.1 % — ABNORMAL HIGH (ref 0.0–0.2)

## 2023-03-29 LAB — GLUCOSE, CAPILLARY
Glucose-Capillary: 211 mg/dL — ABNORMAL HIGH (ref 70–99)
Glucose-Capillary: 219 mg/dL — ABNORMAL HIGH (ref 70–99)
Glucose-Capillary: 273 mg/dL — ABNORMAL HIGH (ref 70–99)
Glucose-Capillary: 110 mg/dL — ABNORMAL HIGH (ref 70–99)
Glucose-Capillary: 112 mg/dL — ABNORMAL HIGH (ref 70–99)

## 2023-03-29 LAB — PROTIME-INR
INR: 3.9 — ABNORMAL HIGH (ref 0.8–1.2)
Prothrombin Time: 38 seconds — ABNORMAL HIGH (ref 11.4–15.2)

## 2023-03-29 MED ORDER — OXYMETAZOLINE HCL 0.05 % NA SOLN
2.0000 | Freq: Two times a day (BID) | NASAL | Status: DC | PRN
  Administered 2023-03-29: 2 via NASAL
  Filled 2023-03-29: qty 30

## 2023-03-29 MED ORDER — PENTAFLUOROPROP-TETRAFLUOROETH EX AERO: 1.0000 | INHALATION_SPRAY | CUTANEOUS | Status: DC | PRN

## 2023-03-29 MED ORDER — HEPARIN SODIUM (PORCINE) 1000 UNIT/ML DIALYSIS: 1000.0000 [IU] | INTRAMUSCULAR | Status: DC | PRN

## 2023-03-29 MED ORDER — CHLORHEXIDINE GLUCONATE CLOTH 2 % EX PADS: 6.0000 | MEDICATED_PAD | Freq: Every day | CUTANEOUS | Status: DC

## 2023-03-29 MED ORDER — FAMOTIDINE 20 MG PO TABS
10.0000 mg | ORAL_TABLET | Freq: Two times a day (BID) | ORAL | Status: DC
  Administered 2023-03-29 – 2023-04-08 (×20): 10 mg via ORAL
  Filled 2023-03-29 (×22): qty 1

## 2023-03-29 MED ORDER — LIDOCAINE HCL (PF) 1 % IJ SOLN: 5.0000 mL | INTRAMUSCULAR | Status: DC | PRN

## 2023-03-29 MED ORDER — ALTEPLASE 2 MG IJ SOLR: 2.0000 mg | Freq: Once | INTRAMUSCULAR | Status: DC | PRN

## 2023-03-29 MED ORDER — WHITE PETROLATUM EX OINT
TOPICAL_OINTMENT | CUTANEOUS | Status: DC | PRN
  Filled 2023-03-29: qty 28.35

## 2023-03-29 MED ORDER — SALINE SPRAY 0.65 % NA SOLN
1.0000 | Freq: Two times a day (BID) | NASAL | Status: DC
  Administered 2023-03-29 – 2023-03-31 (×4): 1 via NASAL
  Filled 2023-03-29: qty 44

## 2023-03-29 MED ORDER — LIDOCAINE-PRILOCAINE 2.5-2.5 % EX CREA: 1.0000 | TOPICAL_CREAM | CUTANEOUS | Status: DC | PRN

## 2023-03-29 MED ORDER — OXYMETAZOLINE HCL 0.05 % NA SOLN
2.0000 | Freq: Two times a day (BID) | NASAL | Status: DC
  Administered 2023-03-29 – 2023-03-31 (×4): 2 via NASAL
  Filled 2023-03-29: qty 30

## 2023-03-29 NOTE — Progress Notes (Signed)
Occupational Therapy Session Note  Patient Details  Name: Colleen [name] MRN: [id number] Date of Birth: 23-Nov-1961  Today's Date: 03/29/2023 OT Individual Time: 0900-1000 OT Individual Time Calculation (min): 60 min    Short Term Goals: Week 1:  OT Short Term Goal 1 (Week 1): Pt will complete 1/3 toileting steps with CGA OT Short Term Goal 2 (Week 1): Pt will complete sit > stand in prep for ADL with max A using LRAD OT Short Term Goal 3 (Week 1): Pt will complete transfer with mod A of 1 using LRAD to promote OOB toileting  Skilled Therapeutic Interventions/Progress Updates:  Patient seen this date for skilled OT to improve functional outcome.  Upon arrival , the pt indicated that she was unable to rest, but she was in agreement with completing OT and performing a BADL task in bathing EOB.  The pt also expressed a desire to  have her blood sugar monitor more frequently secondary to a low reading. The pt was able to come from supine in EOB with CGA.  The pt was able to wash her face, BUE, chest, midriff and perineal with s/u assist.   The patient was able to come from sit to stand using the bed rail for additional balance at MinA to wash her bottom. Using the RW for additional balance.  The pt was able to donn a overhead shirt with s/u assist using the hemi technique, she was able to donn her LB garments, brief and pants with MinA for threading the item over BLE.  At the end of the treatment session the pt returned to bed LOF.  The pt was able to incorporate the bed for balance. The pt was MinA for bring BLE onto the bed from EOB for  returning to supine in bed.  The pt was repositioned up in bed with ModA .  The call light and bedside table were both nearby and the bed alarm was activated. All additional needs were addressed prior to exiting the room.   Therapy Documentation Precautions:  Precautions Precautions: Sternal, Fall, ICD/Pacemaker Precaution Comments: Anxious. Monitor (O2 > 90%),  and HR Restrictions Weight Bearing Restrictions: No Other Position/Activity Restrictions: sternal precautions  Therapy/Group: Individual Therapy  Lavona Mound 03/29/2023, 5:35 PM

## 2023-03-29 NOTE — Progress Notes (Addendum)
[facility] Progress Note   Subjective:    Seen and examined at bedside. Noted patient had episode of epistaxis overnight. Afrin was given and left nostril now packed. She is now on RA but still overloaded. She continues to refuse extra HD and time for over 3 hours. Next HD 4/22. Objective Vitals:   03/29/23 0409 03/29/23 0616 03/29/23 0746 03/29/23 1411  BP: 110/60  100/61 103/67  Pulse: 92   (!) 108  Resp: Temp: (!) 97.5 F (36.4 C)   (!) 97.5 F (36.4 C)  TempSrc: Oral     SpO2: 99%  91% 98%  Weight:  77.1 kg    Height:       Physical Exam General: Well appearing woman, NAD but breathing is mildly labored when talking. Now on RA Heart:RRR; no murmur; healing mid-line chest incision Lungs:Fine bibasilar rales present (sounds slightly improved from yesterday), clear in upper lobes Abdomen: soft Extremities: 2+ BLE edema and 2+ RUE edema Dialysis Access: LUE AVF + bruit  Filed Weights   03/27/23 1652 03/28/23 0521 03/29/23 0616  Weight: 77.1 kg 77 kg 77.1 kg    Intake/Output Summary (Last 24 hours) at 03/29/2023 1418 Last data filed at 03/29/2023 1247 Gross per 24 hour  Intake 357 ml  Output --  Net 357 ml    Additional Objective Labs: Basic Metabolic Panel: Recent Labs  Lab 03/26/23 0557 03/27/23 0525 03/29/23 0614  NA 131* 132* 129*  K 4.1 4.3 3.7  CL 91* 93* 92*  CO2 GLUCOSE 216* 120* 214*  BUN 28* 34* 31*  CREATININE 4.22* 5.30* 5.62*  CALCIUM 7.8* 8.2* 7.6*  PHOS 4.1 4.7* 4.6   Liver Function Tests: Recent Labs  Lab 03/26/23 0557 03/27/23 0525 03/29/23 0614  ALBUMIN 2.2* 2.2* 2.3*   No results for input(s): "LIPASE", "AMYLASE" in the last 168 hours. CBC: Recent Labs  Lab 03/25/23 1053 03/26/23 0557 03/27/23 0525 03/28/23 0610 03/29/23 0614  WBC 8.6 7.8 7.7 7.6 7.9  NEUTROABS  --   --  5.8  --   --   HGB 9.6* 9.9* 9.5* 10.2* 9.7*  HCT 31.1* 32.9* 31.3* 32.5* 31.9*  MCV 94.2 96.5 94.3 91.8 93.5  PLT  175 194 184 179 192   Blood Culture    Component Value Date/Time   SDES BLOOD RIGHT HAND 12/11/2020 1151   SPECREQUEST  12/11/2020 1151    BOTTLES DRAWN AEROBIC AND ANAEROBIC Blood Culture adequate volume   CULT  12/11/2020 1151    NO GROWTH 5 DAYS Performed at [facility], [address] [place], [place] [address]    REPTSTATUS 12/16/2020 FINAL 12/11/2020 1151    Cardiac Enzymes: No results for input(s): "CKTOTAL", "CKMB", "CKMBINDEX", "TROPONINI" in the last 168 hours. CBG: Recent Labs  Lab 03/28/23 1653 03/28/23 2109 03/29/23 0352 03/29/23 0556 03/29/23 1154  GLUCAP 75 231* 211* 219* 273*   Iron Studies: No results for input(s): "IRON", "TIBC", "TRANSFERRIN", "FERRITIN" in the last 72 hours. Lab Results  Component Value Date   INR 3.9 (H) 03/29/2023   INR 4.3 (HH) 03/28/2023   INR 3.7 (H) 03/27/2023   Studies/Results: No results found.  Medications:   amiodarone  200 mg Oral Daily   aspirin EC  81 mg Oral Daily   atomoxetine  18 mg Oral Daily   cinacalcet  60 mg Oral Q M,W,F   darbepoetin (ARANESP) injection - DIALYSIS  60 mcg Subcutaneous Q Fri-1800  droxidopa  500 mg Oral TID WC   famotidine  10 mg Oral BID   insulin aspart  0-5 Units Subcutaneous QHS   insulin aspart  0-6 Units Subcutaneous TID WC   insulin aspart  10 Units Subcutaneous BID AC   insulin aspart  12 Units Subcutaneous QPC breakfast   melatonin  10 mg Oral QHS   midodrine  20 mg Oral TID WC   multivitamin  1 tablet Oral QHS   nutrition supplement (JUVEN)  1 packet Oral BID BM   oxymetazoline  2 spray Each Nare BID   simethicone  80 mg Oral QID   sodium chloride  1 spray Each Nare q12n4p   Vitamin D (Ergocalciferol)  50,000 Units Oral Q7 days   Warfarin - Pharmacist Dosing Inpatient   Does not apply q1600    Dialysis Orders: MWF SW 3h  400/1.5  69.5kg 2K/2Ca bath  Heparin 3000  AVF LUA - last OP HD 3/13, post wt 69.5kg - sensipar  po tiw - hectorol 3 mcg IV tiw -  venofer 50 mg IV weekly   62yo F s/p [facility] admit 3/13-4/16/24 for MV replacement, TV ring, AV decalcification with removal of LV mass. Post-op, she have A-fib and CHB, underwent leadless PPM on 02/26/23. Hospitalization c/b hypotension, on midodrine + Strattera + Droxidopa. Will be on warfarin + ASA for coagulation. Transferred to CIR for rehab.   Assessment/Plan: 1. Valvular heart disease s/p MVR, TV ring, AoV decalcification with removal LV mass 02/18/23: On warfarin for anticoagulation. 2. ESRD: Will continue outpatient HD schedule - next HD 4/22. She refuses to run > 3hr treatment, refusing extra HD for volume removal-I discussed with her again on doing an extra HD and she firmly refused. Unfortunately, she clotted off on Friday causing only 1.5L fluid removal. Plan to raise Heparin bolus to 4,000 units, but given recent epistaxis, will hold off on mid-run dose for now. Will need to monitor this closely. I explained this to [name] and she verbalized understanding. 3. HYPOTN/volume: On midodrine  TID, droxidopa  TID, and atomoxetine  daily for hypotension. She is very edematous - will see what we can get off with dialysis, hopefully start slowly getting her weight down. 4. Anemia of ESRD: Hgb 9.7 - hard to tell when last ESA was given, last Aranesp on 4/19. Monitor trend, will raise if no improvement. 5. Secondary hyperparathyroidism:  Corr/Phos at goal, on cinacalcet  TIW. Hold VDRA and binders for now. 6. Nutrition:  On protein supplements 7. T2DM: On insulin, per primary. 8. A-fib: On amiodarone. 9. Debility: In CIR - follow progress.  Colleen Holmes, NP [name] 03/29/2023,2:18 PM  LOS: 5 days

## 2023-03-29 NOTE — Progress Notes (Addendum)
PROGRESS NOTE   Subjective/Complaints: Pt with epistaxis overnight thru left nostril. Affected sleep. Pressure and ice applied. Pt given Afrin this morning. Nose has not been packed.   ROS: Patient denies fever, rash, sore throat, blurred vision, dizziness, nausea, vomiting, diarrhea, cough, shortness of breath or chest pain, joint or back/neck pain, headache      Objective:   DG Chest 2 View  Result Date: 03/27/2023 CLINICAL DATA:  Shortness of breath. EXAM: CHEST - 2 VIEW COMPARISON:  CTA chest 12/08/2020.  One-view chest x-ray 12/08/2020. FINDINGS: The heart is enlarged. Monitoring device is in place. Atherosclerotic calcifications are present at the aortic arch. Aortic valve is noted. A right pleural effusion is present. Asymmetric interstitial edema is present on the right. Dependent airspace disease is noted in the lower lobe. Mild pulmonary vascular congestion is present on the left without focal airspace disease. Surgical clips are present in the right axilla. The visualized soft tissues and bony thorax are otherwise unremarkable. IMPRESSION: 1. Cardiomegaly with asymmetric interstitial edema and right pleural effusion compatible with congestive heart failure. 2. Dependent airspace disease in the right lower lobe likely reflects atelectasis. Infection is not excluded. Electronically Signed   By: Marin Roberts M.D.   On: 03/27/2023 10:41   Recent Labs    03/28/23 0610 03/29/23 0614  WBC 7.6 7.9  HGB 10.2* 9.7*  HCT 32.5* 31.9*  PLT 179 192   Recent Labs    03/27/23 0525 03/29/23 0614  NA 132* 129*  K 4.3 3.7  CL 93* 92*  CO2 25 23  GLUCOSE 120* 214*  BUN 34* 31*  CREATININE 5.30* 5.62*  CALCIUM 8.2* 7.6*    Intake/Output Summary (Last 24 hours) at 03/29/2023 0856 Last data filed at 03/29/2023 0757 Gross per 24 hour  Intake 474 ml  Output --  Net 474 ml     Pressure Injury 03/24/23 Buttocks Left Stage 2 -   Partial thickness loss of dermis presenting as a shallow open injury with a red, pink wound bed without slough. (Active)  03/24/23 1500  Location: Buttocks  Location Orientation: Left  Staging: Stage 2 -  Partial thickness loss of dermis presenting as a shallow open injury with a red, pink wound bed without slough.  Wound Description (Comments):   Present on Admission: Yes    Physical Exam: Vital Signs Blood pressure 100/61, pulse 92, temperature (!) 97.5 F (36.4 C), temperature source Oral, resp. rate 16, height 5' 3.86" (1.622 m), weight 77.1 kg, last menstrual period 12/08/2013, SpO2 91 %. Constitutional: No distress . Vital signs reviewed. HEENT: NCAT, EOMI, oral membranes moist, epistaxis from left nostril Neck: supple Cardiovascular: RRR without murmur. No JVD    Respiratory/Chest: CTA Bilaterally without wheezes or rales. Normal effort    GI/Abdomen: BS +, non-tender, non-distended Ext: no clubbing, cyanosis, or edema Psych: flat to anxious  Musculoskeletal:     Cervical back: Neck supple. No tenderness.     Comments: Ue's 5-/5 B/L HF/KE 3+/5 B/L; DF/PF 4+/5 B/L  Skin:    General: chest wall incisions CDI    Comments: 2-3+ edema BLE- and some wooding in hands and arms- from reduced swelling--stable to improved L  buttock pressure ulcer- Stage II- ~1.5 x 0.5 cm- pink- slightly open Incision closed on R chest and L chest from prior HD catheters Fistula L upper arm- (+) thrill  +LE edema Neurological:     Mental Status: She is alert.     Comments: Right facial weakness without dysarthria. Speech slow but clear. Oriented X 4. Intact to light touch in all 4 extremities per pt  Fully oriented       Assessment/Plan: 1. Functional deficits which require 3+ hours per day of interdisciplinary therapy in a comprehensive inpatient rehab setting. Physiatrist is providing close team supervision and 24 hour management of active medical problems listed below. Physiatrist and rehab  team continue to assess barriers to discharge/monitor patient progress toward functional and medical goals  Care Tool:  Bathing    Body parts bathed by patient: Right arm, Left arm, Chest, Abdomen, Face, Right upper leg, Left upper leg   Body parts bathed by helper: Front perineal area, Buttocks, Right lower leg, Left lower leg     Bathing assist Assist Level: Supervision/Verbal cueing     Upper Body Dressing/Undressing Upper body dressing   What is the patient wearing?: Button up shirt    Upper body assist Assist Level: Minimal Assistance - Patient > 75%    Lower Body Dressing/Undressing Lower body dressing      What is the patient wearing?: Incontinence brief, Pants     Lower body assist Assist for lower body dressing: Moderate Assistance - Patient 50 - 74%     Toileting Toileting    Toileting assist Assist for toileting: Moderate Assistance - Patient 50 - 74%     Transfers Chair/bed transfer  Transfers assist  Chair/bed transfer activity did not occur: Safety/medical concerns (weakness, fear)  Chair/bed transfer assist level: Moderate Assistance - Patient 50 - 74%     Locomotion Ambulation   Ambulation assist   Ambulation activity did not occur: Safety/medical concerns (fatigue, fear, generalized weakness)  Assist level: Minimal Assistance - Patient > 75% Assistive device: Walker-rolling Max distance: 55   Walk 10 feet activity   Assist  Walk 10 feet activity did not occur: Safety/medical concerns (fatigue, fear, generalized weakness)  Assist level: Minimal Assistance - Patient > 75% Assistive device: Walker-rolling   Walk 50 feet activity   Assist Walk 50 feet with 2 turns activity did not occur: Safety/medical concerns (fatigue, fear, generalized weakness)  Assist level: Minimal Assistance - Patient > 75% Assistive device: Walker-rolling    Walk 150 feet activity   Assist Walk 150 feet activity did not occur: Safety/medical concerns  (fatigue, fear, generalized weakness)         Walk 10 feet on uneven surface  activity   Assist Walk 10 feet on uneven surfaces activity did not occur: Safety/medical concerns (fatigue, fear, generalized weakness)         Wheelchair     Assist Is the patient using a wheelchair?: Yes Type of Wheelchair: Manual Wheelchair activity did not occur: Safety/medical concerns (fatigue, fear, generalized weakness)         Wheelchair 50 feet with 2 turns activity    Assist    Wheelchair 50 feet with 2 turns activity did not occur: Safety/medical concerns (fatigue, fear, generalized weakness)       Wheelchair 150 feet activity     Assist  Wheelchair 150 feet activity did not occur: Safety/medical concerns (fatigue, fear, generalized weakness)       Blood pressure 100/61, pulse 92, temperature (!) 97.5  F (36.4 C), temperature source Oral, resp. rate 16, height 5' 3.86" (1.622 m), weight 77.1 kg, last menstrual period 12/08/2013, SpO2 91 %.    Medical Problem List and Plan: 1. Functional deficits secondary to debility after MV repair with sternal precautions             -patient may  shower if cover incisions- HD catheters in chest are out- also has sternal incisions that need to be covered             -ELOS/Goals: 10-14 days supervision to min A  -Continue CIR therapies including PT, OT  2.  Mechanical MVR: INR therapeutic and heparin drip d/ced.  Pharmaceutical: Coumadin  --INR supra-therapeutic this am             -antiplatelet therapy: ASA 3. Pain Management:  Tylenol prn. Lidocaine patches prn. Denies pain 4. Insomnia: increase melatonin to  HS 4/20  -Charge nurse spoke with her about PM nursing care.  5. Anxiety: neuropsych consulted 6. Skin/Wound Care: Routine pressure relief measures with frequent pressure relief measures for sacral decub.             --Interdry to skin folds.              --Add vitamin C and Zinc to promote wound healing 7.  Fluids/Electrolytes/Nutrition: Strict I/O. Labs with HD.  --Will keep diet as regular to help with food choices             --Juven and nephro added. 8. MVR/TVR by Dr. Felipa Furnace [phone]): Continue Sternal precautions.              --PPM for CHB             --coumadin with INR goal 2.5-3.5 9.  Hypotension: Monitor for symptoms with increase in activity             --on midodrine, Staterra and Droxidopa.  10.  ESRD: Schedule HD at the end of the day. Renal diet with 1200 cc FR.  11. T2DM: Monitor BS 5 X day with novolog 12 units w/breakfast and 15 units BID. Provided list of foods that are good for diabetes 12. A fib: Monitor HR TID. No BB due to hypotension. HR continues to be elevated, continue to monitor             --on coumadin. 13. Anemia of chronic disease: Monitor H/H with HD labs/             --transfuse prn Hgb <7.0 or if symptomatic.  14. Anxiety- pt cannot tolerate door/curtain closed 15. New O2 requirement with mild tachypnea/volume overload -  -volume mgt per nephrology with HD  -continue oxygen as needed--humidfy 16. Overweight: provide dietary education 17. Vitamin D insufficiency: start ergocalciferol 50,000U once per week for 7 weeks 18. Shortness of breath: improved, reviewed CXR results with her that show pleural effusion and atelectasis 19. Epistaxis left nostril--likely exacerbated by O2 [place] and elevated INR  -Hgb stable at 9.5, bleeding is mild to moderate  -hold coumadin today  -will schedule afrin  -pack nostril with 2x2 gauze  -humidified O2  -ocean nasal spray as possible    LOS: 5 days A FACE TO FACE EVALUATION WAS PERFORMED  Ranelle Oyster 03/29/2023, 8:56 AM

## 2023-03-29 NOTE — Progress Notes (Signed)
Pt refusing to decline HOB and alleviate pressure from sacrum. Pt educated on pressure relief.  Mylo Red, LPN

## 2023-03-29 NOTE — Progress Notes (Signed)
ANTICOAGULATION CONSULT NOTE   Pharmacy Consult for Warfarin , INR goal 2.5-3.5  Indication: Mechanical MVR  Allergies  Allergen Reactions   Farxiga [Dapagliflozin] Other (See Comments)    Cannot take due to kidneys   Hydrocodone Other (See Comments)    "Increase Glucose level," per pt   Kombiglyze Xr [Saxagliptin-Metformin Er] Other (See Comments)    Cannot take due to kidneys    Patient Measurements: Height: 5' 3.86" (162.2 cm) (from [facility] records on 02/18/23) Weight: 77.1 kg (169 lb 15.6 oz) IBW/kg (Calculated) : 54.37 Heparin Dosing Weight: 70.1 kg   Vital Signs: Temp: 97.5 F (36.4 C) (04/21 0409) Temp Source: Oral (04/21 0409) BP: 100/61 (04/21 0746) Pulse Rate: 92 (04/21 0409)  Labs: Recent Labs    03/27/23 0525 03/28/23 0610 03/29/23 0614  HGB 9.5* 10.2* 9.7*  HCT 31.3* 32.5* 31.9*  PLT 184 179 192  LABPROT 36.1* 41.1* 38.0*  INR 3.7* 4.3* 3.9*  CREATININE 5.30*  --  5.62*     Estimated Creatinine Clearance: 10.7 mL/min (A) (by C-G formula based on SCr of 5.62 mg/dL (H)).   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 Then developed CHB and afib post-op and on 3/21 underwent leadless PPM placement.   Pharmacy consult for IV heparin and Warfarin , goal INR 2.5-3.5 for mechantcal MVR.   She also  has a history of DVT/PE. 2016, 2019)  Patient reports she was not taking any anticoagulation prior to the [facility] admission, and it has been years since she has taken Eliquis.   At [facility] she was started on Warfarin .  She received Heparin IV 1300 units/hr 4/13-4/15 then hehparin drip was discontinued on  4/15 (PTT was 51.3 sec on 4/15) , when INR 1.8.  Today the INR = 2.3 (4/16 AM) at [facility].  Goal INR 2.5-3.5 INR drawn  admit 4/16 = 2.2 She received  Warfarin   , , , , 3.5mg  ,3.5mg   from 4/10 -03/23/23 at [facility],  Per [facility] records , Warfarin started ~02/21/23  s/p mechanical MVR 02/19/23.    Amiodarone has potential drug-drug interaction(DDI) w/Warfarin,  to increase hypoprothrombinemic effect of Warfarin. Monitoring this by protime Danae Orleans labs.  Per [facility]  records Amiodarone  started  ~3/21 w/load, Amio  QD since 4/10 and Warfarin started ~02/21/23 at [facility]), thus not a new /additional med.   [facility]  records reviewed, found on warfarin 2.5mg  daily as of 02/21/23 and most recent doses given at [facility]  prior to CIR admit (4/16) were  , , , , 3.5mg  ,3.5mg   from 4/10 -03/23/23.    Most recent INR trend at [facility]  4/14-4/16/24: INR 1.7-1.8-2.3 / repeat 2.2.  INR  2.4/ repeat  2.5 on 4/17 and today 4/18 INR is 2.9, therapeutic . Goal INR = 2.5-3.5.    Heparin drip discontinued on 4/17 due to therapeutic INR.   4/20 Update: INR is supratherapeutic again today at 3.9, but is trending down. Hgb 9.7, plt 192. Patient is on amiodarone which can increase INR, however, patient has been on this medication since ~02/26/23, started at [facility].   Overnight, patient with intermittent nose bleeds, which have stabilized this morning. Will hold dose again today to allow epistaxis to resolve.   Goal of Therapy:  INR 2.5-3.5  Plan:  Hold warfarin dose on 03/29/23 Monitor daily INR and CBC F/u resolution of epistaxis on 4/22 Monitor for signs and symptoms of bleeding, drug-drug interactions  Thank you  for allowing pharmacy to be part of this patients care team.  Rockwell Alexandria, PharmD PGY1 Pharmacy Resident 03/29/2023 8:32 AM

## 2023-03-29 NOTE — Progress Notes (Signed)
Epistaxis noted for a second occurrence at this time. Pressure and ice applied. Pharmacist made aware r/t warfarin. Left nostril continues to bleed intermittently. Pt advised to avoid touching the area.  On call PA notified. New order received. Mylo Red, LPN

## 2023-03-30 DIAGNOSIS — R5381 Other malaise: Secondary | ICD-10-CM | POA: Diagnosis not present

## 2023-03-30 LAB — CBC
HCT: 34.1 % — ABNORMAL LOW (ref 36.0–46.0)
Hemoglobin: 10.5 g/dL — ABNORMAL LOW (ref 12.0–15.0)
MCH: 28.5 pg (ref 26.0–34.0)
MCHC: 30.8 g/dL (ref 30.0–36.0)
MCV: 92.7 fL (ref 80.0–100.0)
Platelets: 246 10*3/uL (ref 150–400)
RBC: 3.68 MIL/uL — ABNORMAL LOW (ref 3.87–5.11)
RDW: 18.6 % — ABNORMAL HIGH (ref 11.5–15.5)
WBC: 8.3 10*3/uL (ref 4.0–10.5)
nRBC: 1.7 % — ABNORMAL HIGH (ref 0.0–0.2)

## 2023-03-30 LAB — RENAL FUNCTION PANEL
Albumin: 2.4 g/dL — ABNORMAL LOW (ref 3.5–5.0)
Anion gap: 19 — ABNORMAL HIGH (ref 5–15)
BUN: 41 mg/dL — ABNORMAL HIGH (ref 6–20)
CO2: 20 mmol/L — ABNORMAL LOW (ref 22–32)
Calcium: 8 mg/dL — ABNORMAL LOW (ref 8.9–10.3)
Chloride: 91 mmol/L — ABNORMAL LOW (ref 98–111)
Creatinine, Ser: 6.72 mg/dL — ABNORMAL HIGH (ref 0.44–1.00)
GFR, Estimated: 7 mL/min — ABNORMAL LOW (ref >60)
Glucose, Bld: 163 mg/dL — ABNORMAL HIGH (ref 70–99)
Phosphorus: 5.4 mg/dL — ABNORMAL HIGH (ref 2.5–4.6)
Potassium: 4.4 mmol/L (ref 3.5–5.1)
Sodium: 130 mmol/L — ABNORMAL LOW (ref 135–145)

## 2023-03-30 LAB — GLUCOSE, CAPILLARY
Glucose-Capillary: 130 mg/dL — ABNORMAL HIGH (ref 70–99)
Glucose-Capillary: 141 mg/dL — ABNORMAL HIGH (ref 70–99)
Glucose-Capillary: 185 mg/dL — ABNORMAL HIGH (ref 70–99)
Glucose-Capillary: 223 mg/dL — ABNORMAL HIGH (ref 70–99)
Glucose-Capillary: 37 mg/dL — CL (ref 70–99)
Glucose-Capillary: 53 mg/dL — ABNORMAL LOW (ref 70–99)
Glucose-Capillary: 65 mg/dL — ABNORMAL LOW (ref 70–99)
Glucose-Capillary: 114 mg/dL — ABNORMAL HIGH (ref 70–99)

## 2023-03-30 LAB — PROTIME-INR
INR: 3.9 — ABNORMAL HIGH (ref 0.8–1.2)
Prothrombin Time: 37.7 seconds — ABNORMAL HIGH (ref 11.4–15.2)

## 2023-03-30 MED ORDER — HEPARIN SODIUM (PORCINE) 1000 UNIT/ML IJ SOLN
INTRAMUSCULAR | Status: AC
  Administered 2023-03-30: 4000 [IU]
  Filled 2023-03-30: qty 3

## 2023-03-30 MED ORDER — TRAZODONE HCL 50 MG PO TABS
25.0000 mg | ORAL_TABLET | Freq: Every evening | ORAL | Status: DC | PRN
  Administered 2023-03-30: 50 mg via ORAL
  Administered 2023-03-30: 25 mg via ORAL
  Administered 2023-04-03 – 2023-04-08 (×6): 50 mg via ORAL
  Filled 2023-03-30 (×8): qty 1

## 2023-03-30 MED ORDER — HYDROXYZINE HCL 10 MG PO TABS
10.0000 mg | ORAL_TABLET | Freq: Three times a day (TID) | ORAL | Status: DC | PRN
  Administered 2023-03-30: 10 mg via ORAL
  Filled 2023-03-30: qty 1

## 2023-03-30 NOTE — Progress Notes (Signed)
Hypoglycemic Event  CBG: 37  Treatment: 8 oz juice/soda  Symptoms: None  Follow-up CBG: Time:65 CBG Result:114  Possible Reasons for Event: Unknown  Comments/MD notified: yes. On call provider    Alta Corning

## 2023-03-30 NOTE — Progress Notes (Signed)
ANTICOAGULATION CONSULT NOTE   Pharmacy Consult for Warfarin , INR goal 2.5-3.5  Indication: Mechanical MVR  Allergies  Allergen Reactions   Farxiga [Dapagliflozin] Other (See Comments)    Cannot take due to kidneys   Hydrocodone Other (See Comments)    "Increase Glucose level," per pt   Kombiglyze Xr [Saxagliptin-Metformin Er] Other (See Comments)    Cannot take due to kidneys    Patient Measurements: Height: 5' 3.86" (162.2 cm) (from [facility] records on 02/18/23) Weight: 77.3 kg (170 lb 6.7 oz) IBW/kg (Calculated) : 54.37 Heparin Dosing Weight: 70.1 kg   Vital Signs: Temp: 97.7 F (36.5 C) (04/22 0620) Temp Source: Axillary (04/22 0620) BP: 115/71 (04/22 0620) Pulse Rate: 97 (04/22 0620)  Labs: Recent Labs    03/28/23 0610 03/29/23 0614 03/30/23 0745  HGB 10.2* 9.7* 10.5*  HCT 32.5* 31.9* 34.1*  PLT 179 192 246  LABPROT 41.1* 38.0* 37.7*  INR 4.3* 3.9* 3.9*  CREATININE  --  5.62* 6.72*     Estimated Creatinine Clearance: 8.9 mL/min (A) (by C-G formula based on SCr of 6.72 mg/dL (H)).   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 Then developed CHB and afib post-op and on 3/21 underwent leadless PPM placement.   Pharmacy consult for IV heparin and Warfarin , goal INR 2.5-3.5 for mechantcal MVR.   She  also has a history of DVT/PE. 2016, 2019)  Patient reports she was not taking any anticoagulation prior to the [facility] admission, and it has been years since she has taken Eliquis.   At [facility] she was started on Warfarin .  She received Heparin IV 1300 units/hr 4/13-4/15 then hehparin drip was discontinued on  4/15 (PTT was 51.3 sec on 4/15) , when INR 1.8.  Today the INR = 2.3 (4/16 AM) at [facility].  Goal INR 2.5-3.5 INR drawn  admit 4/16 = 2.2 She received  Warfarin   , , , , 3.5mg  ,3.5mg   from 4/10 -03/23/23 at [facility],  Per [facility] records , Warfarin started ~02/21/23  s/p mechanical MVR 02/19/23.    Amiodarone has potential drug-drug interaction(DDI) w/Warfarin,  to increase hypoprothrombinemic effect of Warfarin. Monitoring this by protime Danae Orleans labs.  Per [facility]  records Amiodarone  started  ~3/21 w/load, Amio  QD since 4/10 and Warfarin started ~02/21/23 at [facility]), thus not a new /additional med.   [facility]  records reviewed, found on warfarin 2.5mg  daily as of 02/21/23 and most recent doses given at [facility]  prior to CIR admit (4/16) were  , , , , 3.5mg  ,3.5mg   from 4/10 -03/23/23.    Most recent INR trend at [facility]  4/14-4/16/24: INR 1.7-1.8-2.3 / repeat 2.2.  INR  2.4/ repeat  2.5 on 4/17 and today 4/18 INR is 2.9, therapeutic . Goal INR = 2.5-3.5.    Heparin drip discontinued on 4/17 due to therapeutic INR.   INR is still supratherapeutic today at 3.9. Rn will assess on the epitaxis issue today since it's still packed. Regardless, we will hold coumadin again today due to INR.   Hgb 10.5, plt wnl  Goal of Therapy:  INR 2.5-3.5  Plan:  No coumadin today Monitor daily INR and CBC F/u resolution of epistaxis Monitor for signs and symptoms of bleeding, drug-drug interactions  Ulyses Southward, PharmD, [place], AAHIVP, CPP Infectious Disease Pharmacist 03/30/2023 10:00 AM

## 2023-03-30 NOTE — Progress Notes (Signed)
[facility] Progress Note   Subjective:    Seen and examined at bedside. No more epistaxis overnight. She feels like she is improving overall-  is overloaded   Objective Vitals:   03/29/23 1945 03/29/23 2137 03/30/23 0512 03/30/23 0620  BP: (!) 99/59 107/63  115/71  Pulse: 93 95  97  Resp: 18   18  Temp: 98.1 F (36.7 C)   97.7 F (36.5 C)  TempSrc: Oral   Axillary  SpO2: 96%   100%  Weight:   77.3 kg   Height:       Physical Exam General: Well appearing woman, NAD but breathing is mildly labored when talking. Now on RA-  right facial asymetry Heart:RRR; no murmur; healing mid-line chest incision Lungs:Fine bibasilar rales present (sounds slightly improved from yesterday), clear in upper lobes Abdomen: soft Extremities: 2+ BLE edema and 2+ RUE edema Dialysis Access: LUE AVF + bruit  Filed Weights   03/28/23 0521 03/29/23 0616 03/30/23 0512  Weight: 77 kg 77.1 kg 77.3 kg    Intake/Output Summary (Last 24 hours) at 03/30/2023 1103 Last data filed at 03/30/2023 0715 Gross per 24 hour  Intake 417 ml  Output --  Net 417 ml    Additional Objective Labs: Basic Metabolic Panel: Recent Labs  Lab 03/27/23 0525 03/29/23 0614 03/30/23 0745  NA 132* 129* 130*  K 4.3 3.7 4.4  CL 93* 92* 91*  CO2 25 23 20*  GLUCOSE 120* 214* 163*  BUN 34* 31* 41*  CREATININE 5.30* 5.62* 6.72*  CALCIUM 8.2* 7.6* 8.0*  PHOS 4.7* 4.6 5.4*   Liver Function Tests: Recent Labs  Lab 03/27/23 0525 03/29/23 0614 03/30/23 0745  ALBUMIN 2.2* 2.3* 2.4*   No results for input(s): "LIPASE", "AMYLASE" in the last 168 hours. CBC: Recent Labs  Lab 03/26/23 0557 03/27/23 0525 03/28/23 0610 03/29/23 0614 03/30/23 0745  WBC 7.8 7.7 7.6 7.9 8.3  NEUTROABS  --  5.8  --   --   --   HGB 9.9* 9.5* 10.2* 9.7* 10.5*  HCT 32.9* 31.3* 32.5* 31.9* 34.1*  MCV 96.5 94.3 91.8 93.5 92.7  PLT 194 184 179 192 246   Blood Culture    Component Value Date/Time   SDES BLOOD RIGHT HAND  12/11/2020 1151   SPECREQUEST  12/11/2020 1151    BOTTLES DRAWN AEROBIC AND ANAEROBIC Blood Culture adequate volume   CULT  12/11/2020 1151    NO GROWTH 5 DAYS Performed at [facility], [address] [place], [place] [address]    REPTSTATUS 12/16/2020 FINAL 12/11/2020 1151    Cardiac Enzymes: No results for input(s): "CKTOTAL", "CKMB", "CKMBINDEX", "TROPONINI" in the last 168 hours. CBG: Recent Labs  Lab 03/29/23 1607 03/29/23 2105 03/30/23 0310 03/30/23 0609 03/30/23 0935  GLUCAP 110* 112* 130* 141* 185*   Iron Studies: No results for input(s): "IRON", "TIBC", "TRANSFERRIN", "FERRITIN" in the last 72 hours. Lab Results  Component Value Date   INR 3.9 (H) 03/30/2023   INR 3.9 (H) 03/29/2023   INR 4.3 (HH) 03/28/2023   Studies/Results: No results found.  Medications:   amiodarone  200 mg Oral Daily   aspirin EC  81 mg Oral Daily   atomoxetine  18 mg Oral Daily   Chlorhexidine Gluconate Cloth  6 each Topical Q0600   cinacalcet  60 mg Oral Q M,W,F   darbepoetin (ARANESP) injection - DIALYSIS  60 mcg Subcutaneous Q Fri-1800   droxidopa  500 mg Oral TID WC  famotidine  10 mg Oral BID   insulin aspart  0-5 Units Subcutaneous QHS   insulin aspart  0-6 Units Subcutaneous TID WC   insulin aspart  10 Units Subcutaneous BID AC   insulin aspart  12 Units Subcutaneous QPC breakfast   melatonin  10 mg Oral QHS   midodrine  20 mg Oral TID WC   multivitamin  1 tablet Oral QHS   nutrition supplement (JUVEN)  1 packet Oral BID BM   oxymetazoline  2 spray Each Nare BID   simethicone  80 mg Oral QID   sodium chloride  1 spray Each Nare q12n4p   Vitamin D (Ergocalciferol)  50,000 Units Oral Q7 days   Warfarin - Pharmacist Dosing Inpatient   Does not apply q1600    Dialysis Orders: MWF SW 3h  400/1.5  69.5kg 2K/2Ca bath  Heparin 3000  AVF LUA - last OP HD 3/13, post wt 69.5kg - sensipar  po tiw - hectorol 3 mcg IV tiw - venofer 50 mg IV weekly   62yo F s/p  [facility] admit 3/13-4/16/24 for MV replacement, TV ring, AV decalcification with removal of LV mass. Post-op, she have A-fib and CHB, underwent leadless PPM on 02/26/23. Hospitalization c/b hypotension, on midodrine + Strattera + Droxidopa. Will be on warfarin + ASA for coagulation. Transferred to CIR for rehab.   Assessment/Plan: 1. Valvular heart disease s/p MVR, TV ring, AoV decalcification with removal LV mass 02/18/23: On warfarin for anticoagulation. 2. ESRD: Will continue outpatient HD schedule - next HD 4/22. She refuses to run > 3hr treatment, refusing extra HD for volume removal-I  had a plan to just inc HD time by 15 min and do UF only but then she told me that she would do an extra 2 hour uf tomorrow to pull fluid-  she is concerned that her BP is so low-  I told her we just need to navigate this new normal for her.   Plan to raise Heparin bolus to 4,000 units, but given recent epistaxis, will hold off on mid-run dose for now. Will need to monitor this closely. 3. HYPOTN/volume: On midodrine  TID, droxidopa  TID, and atomoxetine  daily for hypotension. She is very edematous - will see what we can get off with dialysis, hopefully start slowly getting her weight down. 4. Anemia of ESRD: Hgb 9.7 - hard to tell when last ESA was given, last Aranesp on 4/19. Monitor trend, will raise if no improvement. 5. Secondary hyperparathyroidism:  Corr/Phos at goal, on cinacalcet  TIW. Hold VDRA and binders for now. 6. Nutrition:  On protein supplements 7. T2DM: On insulin, per primary. 8. A-fib: On amiodarone. 9. Debility: In CIR - follow progress.  [name]  [facility] 03/30/2023,11:03 AM  LOS: 6 days

## 2023-03-30 NOTE — Progress Notes (Signed)
Occupational Therapy Session Note  Patient Details  Name: Colleen [name] MRN: [id number] Date of Birth: 1961-11-27  Today's Date: 03/30/2023 OT Individual Time: 0930-1030 1st Session; 7829-5621 2nd Session  OT Individual Time Calculation (min): 60 min, 61 min    Short Term Goals: Week 1:  OT Short Term Goal 1 (Week 1): Pt will complete 1/3 toileting steps with CGA OT Short Term Goal 2 (Week 1): Pt will complete sit > stand in prep for ADL with max A using LRAD OT Short Term Goal 3 (Week 1): Pt will complete transfer with mod A of 1 using LRAD to promote OOB toileting  Skilled Therapeutic Interventions/Progress Updates:  Session 1:  Pt seen for am session of OT. Pt seated bedside up in w/c and receiving am meds from nursing. OT retrieved light theraputty and trained pt in use as pt with increased B LE edema with refusal of TED hose application and now weaning from O2 therefore needs UE therex for overall CDP mngt. Educated on grasping, rolling, pinching and pressing 10 reps B hands with + teach back. Pt then stood for 3 min for oral care sink side with Rw support unilaterally with min a for sit to stand initially due to sternal precautions and use of LE's only. SpO2 after task 90%. Pt transported to middle gym for time mngt and pt able to completed 2 sets of zero resistance B UE restorator pedal bike in sitting for 1 min forward 1 min backward and rests in between sets with min cues for recovery rest breathing. HR 100 at max. Once back in room, OT reminder for use of Acapella flutter device trng and UE incentive spirometer use between sessions with + verbal understanding reported. Left pt with chair alarm engaged, needs, and nurse call button in reach.    Pain:   no pain reported with sternal incision c/d/I    Session 2:  Pt seen for 2nd session of OT this am. On RA with sats 92% SpO2. Pt transported for energy conservation to therapy gym for standing activity. Pt's renal MD and PA Colleen [name]  arrived and we discussed ACE wrapping LE's as pt refused TED hose. OT applied ACE wraps and alerted nursing for reminder to doff. Therapy care coord note updated for daily application. Pt performed sit to stand at RW to retrieve Velcro'd objects from mirror but then reported need for BM. OT transported for time mngt back to room to toilet.  Pt performed SPT with CGA after min A for powering up to stand without UE's. Use of grab bar for support. Pants and brief mngt with min a. Pt did have small BM and NT was to enter on flowsheet as per confirmation. Pt able to perform hygiene with set up and min A. Seated hand washing with set up for low vision and UE reach. O2 sats remained >90% with final reading back at bed side 91% SpO2 on RA. Left pt with chair alarm engaged, needs, and nurse call button in reach.   Pain:   Denies all pain throughout session     Therapy Documentation Precautions:  Precautions Precautions: Sternal, Fall, ICD/Pacemaker Precaution Comments: Anxious. Monitor (O2 > 90%), and HR Restrictions Weight Bearing Restrictions: No Other Position/Activity Restrictions: sternal precautions   Therapy/Group: Individual Therapy  Colleen [name] 03/30/2023, 7:49 AM

## 2023-03-30 NOTE — Progress Notes (Signed)
Pt inabiltiy to sleep r/t constant thoughts about nose bleed. Changed dressing to pt nose, no blood noted. Pt reassured that he nose bleed has subsided. Pt c/o having anxiety. Mercedes PA notified of pt inability to sleep. New orders received.

## 2023-03-30 NOTE — Progress Notes (Signed)
   03/30/23 1730  Vitals  BP (!) 83/55  MAP (mmHg) 65  BP Location Right Arm  BP Method Automatic  Patient Position (if appropriate) Lying  Pulse Rate 91  Pulse Rate Source Monitor  ECG Heart Rate 91  Resp 18  Oxygen Therapy  SpO2 100 %  O2 Device Room Air  O2 Flow Rate (L/min) 2 L/min  During Treatment Monitoring  Blood Flow Rate (mL/min) 400 mL/min  Arterial Pressure (mmHg) -200 mmHg  Venous Pressure (mmHg) 200 mmHg  TMP (mmHg) -4 mmHg  Ultrafiltration Rate (mL/min) 0 mL/min (uf off due to low bp, pt denies chest pain, SOB, light headedness)  Dialysate Flow Rate (mL/min) 300 ml/min  HD Safety Checks Performed Yes

## 2023-03-30 NOTE — Progress Notes (Signed)
PROGRESS NOTE   Subjective/Complaints: No new complaints this morning Still with nosebleed, discussed with pharmacy and today's coumadin will also be held  ROS: Patient denies fever, rash, sore throat, blurred vision, dizziness, nausea, vomiting, diarrhea, cough, shortness of breath or chest pain, joint or back/neck pain, headache, +epstaxis   Objective:   No results found. Recent Labs    03/29/23 0614 03/30/23 0745  WBC 7.9 8.3  HGB 9.7* 10.5*  HCT 31.9* 34.1*  PLT 192 246   Recent Labs    03/29/23 0614 03/30/23 0745  NA 129* 130*  K 3.7 4.4  CL 92* 91*  CO2 23 20*  GLUCOSE 214* 163*  BUN 31* 41*  CREATININE 5.62* 6.72*  CALCIUM 7.6* 8.0*    Intake/Output Summary (Last 24 hours) at 03/30/2023 1429 Last data filed at 03/30/2023 1230 Gross per 24 hour  Intake 297 ml  Output --  Net 297 ml     Pressure Injury 03/24/23 Buttocks Left Stage 2 -  Partial thickness loss of dermis presenting as a shallow open injury with a red, pink wound bed without slough. (Active)  03/24/23 1500  Location: Buttocks  Location Orientation: Left  Staging: Stage 2 -  Partial thickness loss of dermis presenting as a shallow open injury with a red, pink wound bed without slough.  Wound Description (Comments):   Present on Admission: Yes    Physical Exam: Vital Signs Blood pressure (!) 147/73, pulse 70, temperature 99.5 F (37.5 C), temperature source Axillary, resp. rate 17, height 5' 3.86" (1.622 m), weight 77.3 kg, last menstrual period 12/08/2013, SpO2 94 %. Constitutional: No distress . Vital signs reviewed. HEENT: NCAT, EOMI, oral membranes moist, epistaxis from left nostril, packed Neck: supple Cardiovascular: RRR without murmur. No JVD    Respiratory/Chest: CTA Bilaterally without wheezes or rales. Normal effort    GI/Abdomen: BS +, non-tender, non-distended Ext: no clubbing, cyanosis, or edema Psych: flat to anxious   Musculoskeletal:     Cervical back: Neck supple. No tenderness.     Comments: Ue's 5-/5 B/L HF/KE 3+/5 B/L; DF/PF 4+/5 B/L  Skin:    General: chest wall incisions CDI    Comments: 2-3+ edema BLE- and some wooding in hands and arms- from reduced swelling--stable to improved L buttock pressure ulcer- Stage II- ~1.5 x 0.5 cm- pink- slightly open Incision closed on R chest and L chest from prior HD catheters Fistula L upper arm- (+) thrill  +LE edema Neurological:     Mental Status: She is alert.     Comments: Right facial weakness without dysarthria. Speech slow but clear. Oriented X 4. Intact to light touch in all 4 extremities per pt  Fully oriented       Assessment/Plan: 1. Functional deficits which require 3+ hours per day of interdisciplinary therapy in a comprehensive inpatient rehab setting. Physiatrist is providing close team supervision and 24 hour management of active medical problems listed below. Physiatrist and rehab team continue to assess barriers to discharge/monitor patient progress toward functional and medical goals  Care Tool:  Bathing    Body parts bathed by patient: Right arm, Left arm, Chest, Abdomen, Face, Right upper leg, Left upper leg  Body parts bathed by helper: Front perineal area, Buttocks, Right lower leg, Left lower leg     Bathing assist Assist Level: Supervision/Verbal cueing     Upper Body Dressing/Undressing Upper body dressing   What is the patient wearing?: Button up shirt    Upper body assist Assist Level: Minimal Assistance - Patient > 75%    Lower Body Dressing/Undressing Lower body dressing      What is the patient wearing?: Incontinence brief, Pants     Lower body assist Assist for lower body dressing: Moderate Assistance - Patient 50 - 74%     Toileting Toileting    Toileting assist Assist for toileting: Moderate Assistance - Patient 50 - 74%     Transfers Chair/bed transfer  Transfers assist  Chair/bed transfer  activity did not occur: Safety/medical concerns (weakness, fear)  Chair/bed transfer assist level: Moderate Assistance - Patient 50 - 74%     Locomotion Ambulation   Ambulation assist   Ambulation activity did not occur: Safety/medical concerns (fatigue, fear, generalized weakness)  Assist level: Minimal Assistance - Patient > 75% Assistive device: Walker-rolling Max distance: 55   Walk 10 feet activity   Assist  Walk 10 feet activity did not occur: Safety/medical concerns (fatigue, fear, generalized weakness)  Assist level: Minimal Assistance - Patient > 75% Assistive device: Walker-rolling   Walk 50 feet activity   Assist Walk 50 feet with 2 turns activity did not occur: Safety/medical concerns (fatigue, fear, generalized weakness)  Assist level: Minimal Assistance - Patient > 75% Assistive device: Walker-rolling    Walk 150 feet activity   Assist Walk 150 feet activity did not occur: Safety/medical concerns (fatigue, fear, generalized weakness)         Walk 10 feet on uneven surface  activity   Assist Walk 10 feet on uneven surfaces activity did not occur: Safety/medical concerns (fatigue, fear, generalized weakness)         Wheelchair     Assist Is the patient using a wheelchair?: Yes Type of Wheelchair: Manual Wheelchair activity did not occur: Safety/medical concerns (fatigue, fear, generalized weakness)         Wheelchair 50 feet with 2 turns activity    Assist    Wheelchair 50 feet with 2 turns activity did not occur: Safety/medical concerns (fatigue, fear, generalized weakness)       Wheelchair 150 feet activity     Assist  Wheelchair 150 feet activity did not occur: Safety/medical concerns (fatigue, fear, generalized weakness)       Blood pressure (!) 147/73, pulse 70, temperature 99.5 F (37.5 C), temperature source Axillary, resp. rate 17, height 5' 3.86" (1.622 m), weight 77.3 kg, last menstrual period 12/08/2013,  SpO2 94 %.    Medical Problem List and Plan: 1. Functional deficits secondary to debility after MV repair with sternal precautions             -patient may  shower if cover incisions- HD catheters in chest are out- also has sternal incisions that need to be covered             -ELOS/Goals: 10-14 days supervision to min A  -Continue CIR therapies including PT, OT  2.  Mechanical MVR: INR therapeutic and heparin drip d/ced.  Pharmaceutical: Coumadin  --INR supra-therapeutic this am             -antiplatelet therapy: ASA 3. Pain Management:  Tylenol prn. Lidocaine patches prn. Denies pain 4. Insomnia: increase melatonin to 10mg  HS 4/20  -Charge  nurse spoke with her about PM nursing care.  5. Anxiety: neuropsych consulted 6. Skin/Wound Care: Routine pressure relief measures with frequent pressure relief measures for sacral decub.             --Interdry to skin folds.              --Add vitamin C and Zinc to promote wound healing 7. Fluids/Electrolytes/Nutrition: Strict I/O. Labs with HD.  --Will keep diet as regular to help with food choices             --Juven and nephro added. 8. MVR/TVR by Dr. Felipa Furnace [phone]): Continue Sternal precautions.              --PPM for CHB             --coumadin with INR goal 2.5-3.5 9.  Hypotension: Monitor for symptoms with increase in activity             --on midodrine, Staterra and Droxidopa.  10.  ESRD: Schedule HD at the end of the day. Renal diet with 1200 cc FR.  11. T2DM: Monitor BS 5 X day with novolog 12 units w/breakfast and 15 units BID. Provided list of foods that are good for diabetes 12. A fib: Monitor HR TID. No BB due to hypotension. HR continues to be elevated, continue to monitor             --on coumadin. 13. Anemia of chronic disease: Monitor H/H with HD labs/             --transfuse prn Hgb <7.0 or if symptomatic.  14. Anxiety- pt cannot tolerate door/curtain closed 15. New O2 requirement with mild tachypnea/volume overload  -  -volume mgt per nephrology with HD  -continue oxygen as needed--humidfy 16. Overweight: provide dietary education 17. Vitamin D insufficiency: continue ergocalciferol 50,000U once per week for 7 weeks 18. Shortness of breath: improved, reviewed CXR results with her that show pleural effusion and atelectasis, incentive spirometer ordered 19. Epistaxis left nostril--likely exacerbated by O2 [place] and elevated INR  -Hgb reviewed and trending upward  -hold coumadin today  -will schedule afrin  -pack nostril with 2x2 gauze  -humidified O2  -ocean nasal spray as possible    LOS: 6 days A FACE TO FACE EVALUATION WAS PERFORMED  Clint Bolder P Shealeigh Dunstan 03/30/2023, 2:29 PM

## 2023-03-30 NOTE — Progress Notes (Signed)
Physical Therapy Session Note  Patient Details  Name: Colleen [name] MRN: [id number] Date of Birth: 1961-09-24  Today's Date: 03/30/2023 PT Individual Time: 0802-0918 PT Individual Time Calculation (min): 76 min   Short Term Goals: Week 1:  PT Short Term Goal 1 (Week 1): pt will transfer sit<>stand with LRAD and min A PT Short Term Goal 2 (Week 1): pt will transfer bed<>chair with LRAD and min A PT Short Term Goal 3 (Week 1): pt will ambulate 43ft with LRAD and min A  Skilled Therapeutic Interventions/Progress Updates:    Pt presents in room in [facility] on 1L O2, agreeable to PT however reporting anxious about blood sugar being low yesterday resulting in decreased strength in legs and nose bleed. Pt denies pain at this time.  Pt encouraged to participate with dressing with pt requiring max assist for threading pants and brief and mod assist for donning shirt. Pt completes sit to stand with mod assist to RW and therapist provides max assist for donning pants while providing min assist for standing balance with pt demonstrating increased posterior lean. Pt with increased concerns about leg strength with pt requiring increased need for assist with stands.  Pt transported to gym and session focused on BLE strengthening, sit to stands, standing tolerance, and standing balance secondary to pt concerns with transfers.  Pt completes 3x2 sit<>stands from WC to RW, VC for sternal precautions and power production in BLEs, requires mod/min assist for gluteal clearance with task. SpO2 and HR monitored throughout with SpO2 >96% on 1L O2, HR elevated to 115bpm, remains consistent with rest and activity. Pt placed on room air monitored with no change in SpO2, remains on room air for remainder of session.  Pt completes 2x20 reps and 1 set of 30sec on kinetron workload 90 to promote BLE posterior chain strengthening needed for sit<>stand transfers. Pt provided with rest breaks between sets. SpO2 >94%.  Pt then  ambulates 2x20' with RW CGA/min assist from [facility], demonstrating significant decrease in gait speed, reporting "tightness" in BLEs but no increase in pain or SOB. Pt SpO2 >94% following both gait trials, rest break for energy conservation between trials.  Pt returns to room dependently in [facility] and remains seated in [facility] with all needs within reach, posey belt activated, call light in place, and pt on room air. Therapist sends electronic communication to RN about update on supplemental O2 needs.  Therapy Documentation Precautions:  Precautions Precautions: Sternal, Fall, ICD/Pacemaker Precaution Comments: Anxious. Monitor (O2 > 90%), and HR Restrictions Weight Bearing Restrictions: No Other Position/Activity Restrictions: sternal precautions    Therapy/Group: Individual Therapy  Edwin Cap PT, DPT 03/30/2023, 12:28 PM

## 2023-03-30 NOTE — Progress Notes (Signed)
   03/30/23 1600  Vitals  BP (!) 87/58  MAP (mmHg) 67  BP Location Right Arm  BP Method Automatic  Patient Position (if appropriate) Lying  Pulse Rate 90  Pulse Rate Source Monitor  ECG Heart Rate 93  Resp 17  Oxygen Therapy  SpO2 97 %  O2 Device Nasal Cannula  O2 Flow Rate (L/min) 2 L/min  During Treatment Monitoring  Blood Flow Rate (mL/min) 400 mL/min  Arterial Pressure (mmHg) -190 mmHg  Venous Pressure (mmHg) 230 mmHg  TMP (mmHg) -5 mmHg  Ultrafiltration Rate (mL/min) 0 mL/min (uf off goal decreased to 3L per order parameters, pt feels fine denies chest pain, sob, lightheadedness)

## 2023-03-30 NOTE — Progress Notes (Signed)
Received patient in bed to unit.  Alert and oriented.  Informed consent signed and in chart.   TX duration:3  Patient tolerated well.  Transported back to the room  Alert, without acute distress.  Hand-off given to patient's nurse.   Access used: left AVF Access issues: none  Total UF removed: 2.6L Medication(s) given: none   03/30/23 1751  Vitals  Temp (!) 97.3 F (36.3 C)  Temp Source Oral  BP 92/61  MAP (mmHg) 70  BP Location Right Arm  BP Method Automatic  Patient Position (if appropriate) Lying  Pulse Rate 90  Pulse Rate Source Monitor  ECG Heart Rate 90  Resp (!) 21  Oxygen Therapy  SpO2 100 %  O2 Device Nasal Cannula  O2 Flow Rate (L/min) 2 L/min  During Treatment Monitoring  HD Safety Checks Performed Yes  Intra-Hemodialysis Comments Tx completed  Dialysis Fluid Bolus Normal Saline  Bolus Amount (mL) 300 mL  Fistula / Graft Left Upper arm Arteriovenous fistula  No Placement Date or Time found.   Placed prior to admission: Yes  Orientation: Left  Access Location: Upper arm  Access Type: (c) Arteriovenous fistula  Status Deaccessed;Flushed      Natalie Mceuen S [facility]

## 2023-03-31 DIAGNOSIS — R5381 Other malaise: Secondary | ICD-10-CM | POA: Diagnosis not present

## 2023-03-31 LAB — GLUCOSE, CAPILLARY
Glucose-Capillary: 182 mg/dL — ABNORMAL HIGH (ref 70–99)
Glucose-Capillary: 189 mg/dL — ABNORMAL HIGH (ref 70–99)
Glucose-Capillary: 252 mg/dL — ABNORMAL HIGH (ref 70–99)
Glucose-Capillary: 198 mg/dL — ABNORMAL HIGH (ref 70–99)
Glucose-Capillary: 158 mg/dL — ABNORMAL HIGH (ref 70–99)
Glucose-Capillary: 83 mg/dL (ref 70–99)

## 2023-03-31 LAB — PROTIME-INR
INR: 4.3 (ref 0.8–1.2)
Prothrombin Time: 40.9 seconds — ABNORMAL HIGH (ref 11.4–15.2)

## 2023-03-31 MED ORDER — OXYMETAZOLINE HCL 0.05 % NA SOLN
2.0000 | Freq: Three times a day (TID) | NASAL | Status: DC
  Filled 2023-03-31: qty 30

## 2023-03-31 MED ORDER — CHLORHEXIDINE GLUCONATE CLOTH 2 % EX PADS
6.0000 | MEDICATED_PAD | Freq: Every day | CUTANEOUS | Status: DC
  Administered 2023-04-01: 6 via TOPICAL

## 2023-03-31 MED ORDER — INSULIN ASPART 100 UNIT/ML IJ SOLN
6.0000 [IU] | Freq: Every day | INTRAMUSCULAR | Status: DC
  Administered 2023-04-01: 6 [IU] via SUBCUTANEOUS

## 2023-03-31 MED ORDER — NEPRO/CARBSTEADY PO LIQD
237.0000 mL | Freq: Three times a day (TID) | ORAL | Status: DC
  Administered 2023-04-02 – 2023-04-06 (×12): 237 mL via ORAL

## 2023-03-31 MED ORDER — NEPRO/CARBSTEADY PO LIQD
237.0000 mL | Freq: Every day | ORAL | Status: DC
  Administered 2023-03-31: 237 mL via ORAL

## 2023-03-31 MED ORDER — SALINE SPRAY 0.65 % NA SOLN
1.0000 | Freq: Three times a day (TID) | NASAL | Status: DC
  Administered 2023-03-31 – 2023-04-07 (×18): 1 via NASAL
  Filled 2023-03-31: qty 44

## 2023-03-31 MED ORDER — DOXERCALCIFEROL 4 MCG/2ML IV SOLN
3.0000 ug | INTRAVENOUS | Status: DC
  Administered 2023-04-01 – 2023-04-03 (×2): 3 ug via INTRAVENOUS
  Filled 2023-03-31 (×6): qty 2

## 2023-03-31 MED ORDER — CALCIUM POLYCARBOPHIL 625 MG PO TABS
625.0000 mg | ORAL_TABLET | Freq: Every day | ORAL | Status: DC
  Administered 2023-03-31 – 2023-04-09 (×10): 625 mg via ORAL
  Filled 2023-03-31 (×10): qty 1

## 2023-03-31 MED ORDER — INSULIN ASPART 100 UNIT/ML IJ SOLN
5.0000 [IU] | Freq: Two times a day (BID) | INTRAMUSCULAR | Status: DC
  Administered 2023-03-31 – 2023-04-02 (×3): 5 [IU] via SUBCUTANEOUS
  Administered 2023-04-04: 2 [IU] via SUBCUTANEOUS
  Administered 2023-04-05 – 2023-04-08 (×7): 5 [IU] via SUBCUTANEOUS

## 2023-03-31 NOTE — Progress Notes (Addendum)
Date and time results received: 03/31/23 1027  (use smartphrase ".now" to insert current time)  Test: INR   Critical Value: 4.3  Name of Provider Notified: Delle Reining  Orders Received? Or Actions Taken?:  continue to hold warafin

## 2023-03-31 NOTE — Progress Notes (Signed)
Patient ID: Colleen [name], female   DOB: 1961/09/26, 62 y.o.   MRN: [id number]  SW received VM from pt's mother [name]. SW left VM for call back.

## 2023-03-31 NOTE — Progress Notes (Signed)
[facility] Progress Note   Subjective:    HD yest -  removed 2600-  had agreed to an extra treatment but just dont have the capacity to run her today in the HD unit-  will plan for HD tomorrow on schedule then maybe Thursday for extra tx if needed -  she is ok with plan  Objective Vitals:   03/30/23 1758 03/30/23 1857 03/30/23 2151 03/31/23 0531  BP:  (!) 100/55 (!) 102/59 101/63  Pulse:  91 (!) 107 (!) 104  Resp:  19  18  Temp:  (!) 97.3 F (36.3 C)  97.9 F (36.6 C)  TempSrc:    Oral  SpO2:  94%  92%  Weight: 76.4 kg   75.6 kg  Height:       Physical Exam General: Well appearing woman, NAD but breathing is mildly labored when talking. Now on RA-  right facial asymetry Heart:RRR; no murmur; healing mid-line chest incision Lungs:Fine bibasilar rales present (sounds slightly improved from yesterday), clear in upper lobes Abdomen: soft Extremities: 2+ BLE edema and 2+ RUE edema Dialysis Access: LUE AVF + bruit  Filed Weights   03/30/23 1430 03/30/23 1758 03/31/23 0531  Weight: 79.2 kg 76.4 kg 75.6 kg    Intake/Output Summary (Last 24 hours) at 03/31/2023 1253 Last data filed at 03/31/2023 1610 Gross per 24 hour  Intake 236 ml  Output 2600 ml  Net -2364 ml    Additional Objective Labs: Basic Metabolic Panel: Recent Labs  Lab 03/27/23 0525 03/29/23 0614 03/30/23 0745  NA 132* 129* 130*  K 4.3 3.7 4.4  CL 93* 92* 91*  CO2 25 23 20*  GLUCOSE 120* 214* 163*  BUN 34* 31* 41*  CREATININE 5.30* 5.62* 6.72*  CALCIUM 8.2* 7.6* 8.0*  PHOS 4.7* 4.6 5.4*   Liver Function Tests: Recent Labs  Lab 03/27/23 0525 03/29/23 0614 03/30/23 0745  ALBUMIN 2.2* 2.3* 2.4*   No results for input(s): "LIPASE", "AMYLASE" in the last 168 hours. CBC: Recent Labs  Lab 03/26/23 0557 03/27/23 0525 03/28/23 0610 03/29/23 0614 03/30/23 0745  WBC 7.8 7.7 7.6 7.9 8.3  NEUTROABS  --  5.8  --   --   --   HGB 9.9* 9.5* 10.2* 9.7* 10.5*  HCT 32.9* 31.3* 32.5* 31.9*  34.1*  MCV 96.5 94.3 91.8 93.5 92.7  PLT 194 184 179 192 246   Blood Culture    Component Value Date/Time   SDES BLOOD RIGHT HAND 12/11/2020 1151   SPECREQUEST  12/11/2020 1151    BOTTLES DRAWN AEROBIC AND ANAEROBIC Blood Culture adequate volume   CULT  12/11/2020 1151    NO GROWTH 5 DAYS Performed at [facility], [address], [place], [place] [address]    REPTSTATUS 12/16/2020 FINAL 12/11/2020 1151    Cardiac Enzymes: No results for input(s): "CKTOTAL", "CKMB", "CKMBINDEX", "TROPONINI" in the last 168 hours. CBG: Recent Labs  Lab 03/30/23 1949 03/30/23 2050 03/31/23 0353 03/31/23 0622 03/31/23 1121  GLUCAP 65* 114* 182* 189* 252*   Iron Studies: No results for input(s): "IRON", "TIBC", "TRANSFERRIN", "FERRITIN" in the last 72 hours. Lab Results  Component Value Date   INR 4.3 (HH) 03/31/2023   INR 3.9 (H) 03/30/2023   INR 3.9 (H) 03/29/2023   Studies/Results: No results found.  Medications:   amiodarone  200 mg Oral Daily   aspirin EC  81 mg Oral Daily   atomoxetine  18 mg Oral Daily   cinacalcet  60 mg Oral  Q M,W,F   darbepoetin (ARANESP) injection - DIALYSIS  60 mcg Subcutaneous Q Fri-1800   droxidopa  500 mg Oral TID WC   famotidine  10 mg Oral BID   feeding supplement (NEPRO CARB STEADY)  237 mL Oral QPC lunch   insulin aspart  0-6 Units Subcutaneous TID WC   insulin aspart  10 Units Subcutaneous BID AC   insulin aspart  12 Units Subcutaneous QPC breakfast   melatonin  10 mg Oral QHS   midodrine  20 mg Oral TID WC   multivitamin  1 tablet Oral QHS   nutrition supplement (JUVEN)  1 packet Oral BID BM   oxymetazoline  2 spray Each Nare TID PC & HS   simethicone  80 mg Oral QID   sodium chloride  1 spray Each Nare q12n4p   Vitamin D (Ergocalciferol)  50,000 Units Oral Q7 days   Warfarin - Pharmacist Dosing Inpatient   Does not apply q1600    Dialysis Orders: MWF SW 3h  400/1.5  69.5kg 2K/2Ca bath  Heparin 3000  AVF LUA - last OP HD 3/13,  post wt 69.5kg - sensipar  po tiw - hectorol 3 mcg IV tiw - venofer 50 mg IV weekly   62yo F s/p [facility] admit 3/13-4/16/24 for MV replacement, TV ring, AV decalcification with removal of LV mass. Post-op, she have A-fib and CHB, underwent leadless PPM on 02/26/23. Hospitalization c/b hypotension, on midodrine + Strattera + Droxidopa. Will be on warfarin + ASA for coagulation. Transferred to CIR for rehab.   Assessment/Plan: 1. Valvular heart disease s/p MVR, TV ring, AoV decalcification with removal LV mass 02/18/23: On warfarin for anticoagulation. 2. ESRD: Will continue outpatient HD schedule - next HD 4/24. She refuses to run > 3hr treatment, refusing extra HD for volume removal-I  had a plan to just inc HD time by 15 min and do UF only but then she told me that she would do an extra 2 hour uf to pull fluid-  she is concerned that her BP is so low-  I told her we just need to navigate this new normal for her.   Plan to raise Heparin bolus to 4,000 units, but given recent epistaxis, will hold off on mid-run dose for now. Will need to monitor this closely. Cannot do extra UF today -  maybe Thursday if needed 3. HYPOTN/volume: On midodrine  TID, droxidopa  TID, and atomoxetine  daily for hypotension. She is very edematous - will see what we can get off with dialysis, hopefully start slowly getting her weight down. 4. Anemia of ESRD: Hgb 9.7 -  last Aranesp on 4/19. Monitor trend, will raise if no improvement. 5. Secondary hyperparathyroidism:  Corr/Phos at goal, on cinacalcet  TIW and hectorol. Hold binders for now since phos is reasonable  6. Nutrition:  On protein supplements 7. T2DM: On insulin, per primary. 8. A-fib: On amiodarone. 9. Debility: In CIR - follow progress.  [name]  [facility] 03/31/2023,12:53 PM  LOS: 7 days

## 2023-03-31 NOTE — Progress Notes (Signed)
   03/31/23 1326  Assess: MEWS Score  Temp 97.8 F (36.6 C)  BP (!) 100/54  Pulse Rate (!) 108 (per nurse)  Resp 18  SpO2 96 %  O2 Device Room Air  Assess: MEWS Score  MEWS Temp 0  MEWS Systolic 1  MEWS Pulse 1  MEWS RR 0  MEWS LOC 0  MEWS Score 2  MEWS Score Color Yellow  Assess: if the MEWS score is Yellow or Red  Were vital signs taken at a resting state? Yes  Focused Assessment No change from prior assessment  Does the patient meet 2 or more of the SIRS criteria? No  MEWS guidelines implemented  Yes, yellow  Treat  MEWS Interventions Considered administering scheduled or prn medications/treatments as ordered  Take Vital Signs  Increase Vital Sign Frequency  Yellow: Q2hr x1, continue Q4hrs until patient remains green for 12hrs  Escalate  MEWS: Escalate Yellow: Discuss with charge nurse and consider notifying provider and/or RRT  Notify: Charge Nurse/RN  Name of Charge Nurse/RN Notified Kennyth Arnold, RN  Provider Notification  Provider Name/Title Marissa Nestle, PA-C  Date Provider Notified 03/31/23  Time Provider Notified 1345  Method of Notification Call  Notification Reason Other (Comment) (Yellow Mews)  Provider response No new orders  Date of Provider Response 03/31/23  Time of Provider Response 1347  Assess: SIRS CRITERIA  SIRS Temperature  0  SIRS Pulse 1  SIRS Respirations  0  SIRS WBC 0  SIRS Score Sum  1

## 2023-03-31 NOTE — Progress Notes (Signed)
Occupational Therapy Session Note  Patient Details  Name: Colleen [name] MRN: [id number] Date of Birth: August 21, 1961  Today's Date: 03/31/2023 OT Individual Time: 4098-1191 & 1120-1200 OT Individual Time Calculation (min): 55 min & 40 min   Short Term Goals: Week 1:  OT Short Term Goal 1 (Week 1): Pt will complete 1/3 toileting steps with CGA OT Short Term Goal 2 (Week 1): Pt will complete sit > stand in prep for ADL with max A using LRAD OT Short Term Goal 3 (Week 1): Pt will complete transfer with mod A of 1 using LRAD to promote OOB toileting  Skilled Therapeutic Interventions/Progress Updates:  Session 1 Skilled OT intervention completed with focus on ADL retraining, activity tolerance, functional transfers. Pt received upright in bed, agreeable to session. Unrated pain reported in buttocks; pre-medicated. OT offered rest breaks and repositioning for pain reduction.  Pt initially with good spirits, with OT recommending trial at full shower, however extensive encouragement and education about the nature of a shower and the steps (I.e. covering incisions etc). Pt requested to wait until her family brings in personal toiletries.  Transitioned to EOB with supervision, with pt noting to need extra time for slow transitions this morning and pt stating feelings of fatigue/frustration and was a mildly snappy today. OT offered emotional encouragement about good days and off days but reviewed her overall progress for continued motivation.   Stood with min A using RW, then CGA stand pivot to w/c with RW. Seated at sink, pt completed UB bathing with mod cues for avoiding incisions, but overall supervision A. Nurse present for rounds, with OT informing her of area at site of 1 stitch that looks irritated with bloody discharge. Nurse covered with foam dressing. Set up A for deo, then min A for button down shirt with guidance needed for aligning first button. With brief pre-looped, pt attempted to thread  BLE however unable to clear feet and lean forward or do figure 4, requiring assist to thread brief/pants, with education provided on reacher and potential trial for increasing LB dressing independence. Stood with rocking/counting method with min A using RW after cues provided for 1 hand on w/c vs both of RW, then min A to donn pants over hips. Pt declined TEDs/ACE wrap despite education about benefits.  Pt remained seated in w/c with BLE elevated for edema management, with belt alarm on/activated, set up for breakfast and with all needs in reach at end of session.  Vitals -Received on RA, SPO2 87-90% during entirety of session. Rebounded to 91% at end of session with seated rest therefore remained on RA for O2 weaning.   Session 2 Skilled OT intervention completed with focus on DC planning, and DME education. Pt received seated in w/c with family present, agreeable to session. No pain reported.  Encouraged family to bring in pt's personal toiletries for plan for shower with family agreeable. Discussed with pt/mother/sister about current DME she has available. Reports having tub/shower but nothing to sit on. At this time is not receptive to [facility] for night time toileting or urgency but plan to readdress.  Transported dependently in w/c <> ADL bathroom for time. Education provided on DME available such as tub bench that can be purchased for use at home. OT demonstrated ambulatory transfer to TTB and sit pivot method. Encouraged pt to practice with pt requiring increased amount of time to be comfortable and "ready" to do it herself. Able to stand with min A using RW though heavy lean  and dependence on BUE on RW. Pt with good stature/balance, however pt demanding to return seated stating "I don't feel ready like I'm gonna fall back." Extended amount of time needed to stand again with cues for pushing up from w/c vs both hands on RW, and required 3 attempts to come fully erect with mod A. Ambulated with CGA to  tub bench using RW. Pt verbalized that the seat was hard and too high, and due to time constraint, OT deferred full transfer into tub til different session. Able to stand with min A using RW and ambulate with CGA back to w/c.  Back in room, pt remained seated in w/c, with chair alarm on/activated, and with all needs in reach at end of session.  Vitals -Received on RA, SPO2 WNL for entirety of session therefore remained on RA at conclusion  Therapy Documentation Precautions:  Precautions Precautions: Sternal, Fall, ICD/Pacemaker Precaution Comments: Anxious. Monitor (O2 > 90%), and HR Restrictions Weight Bearing Restrictions: No Other Position/Activity Restrictions: sternal precautions    Therapy/Group: Individual Therapy  Melvyn Novas, MS, OTR/L  03/31/2023, 12:22 PM

## 2023-03-31 NOTE — Progress Notes (Signed)
ANTICOAGULATION CONSULT NOTE   Pharmacy Consult for Warfarin , INR goal 2.5-3.5  Indication: Mechanical MVR  Allergies  Allergen Reactions   Farxiga [Dapagliflozin] Other (See Comments)    Cannot take due to kidneys   Hydrocodone Other (See Comments)    "Increase Glucose level," per pt   Kombiglyze Xr [Saxagliptin-Metformin Er] Other (See Comments)    Cannot take due to kidneys    Patient Measurements: Height: 5' 3.86" (162.2 cm) (from [facility] records on 02/18/23) Weight: 75.6 kg (166 lb 10.7 oz) IBW/kg (Calculated) : 54.37 Heparin Dosing Weight: 70.1 kg   Vital Signs: Temp: 97.9 F (36.6 C) (04/23 0531) Temp Source: Oral (04/23 0531) BP: 101/63 (04/23 0531) Pulse Rate: 104 (04/23 0531)  Labs: Recent Labs    03/29/23 0614 03/30/23 0745 03/31/23 0919  HGB 9.7* 10.5*  --   HCT 31.9* 34.1*  --   PLT 192 246  --   LABPROT 38.0* 37.7* 40.9*  INR 3.9* 3.9* 4.3*  CREATININE 5.62* 6.72*  --      Estimated Creatinine Clearance: 8.8 mL/min (A) (by C-G formula based on SCr of 6.72 mg/dL (H)).   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 Then developed CHB and afib post-op and on 3/21 underwent leadless PPM placement.   Pharmacy consult for IV heparin and Warfarin , goal INR 2.5-3.5 for mechantcal MVR.   She also  has a history of DVT/PE. 2016, 2019)  Patient reports she was not taking any anticoagulation prior to the [facility] admission, and it has been years since she has taken Eliquis.   At [facility] she was started on Warfarin .  She received Heparin IV 1300 units/hr 4/13-4/15 then hehparin drip was discontinued on  4/15 (PTT was 51.3 sec on 4/15) , when INR 1.8.  Today the INR = 2.3 (4/16 AM) at [facility].  Goal INR 2.5-3.5 INR drawn  admit 4/16 = 2.2 She received  Warfarin   , , , , 3.5mg  ,3.5mg   from 4/10 -03/23/23 at [facility],  Per [facility] records , Warfarin started ~02/21/23  s/p mechanical MVR 02/19/23.    Amiodarone has potential drug-drug interaction(DDI) w/Warfarin,  to increase hypoprothrombinemic effect of Warfarin. Monitoring this by protime Danae Orleans labs.  Per [facility]  records Amiodarone  started  ~3/21 w/load, Amio  QD since 4/10 and Warfarin started ~02/21/23 at [facility]), thus not a new /additional med.   [facility]  records reviewed, found on warfarin 2.5mg  daily as of 02/21/23 and most recent doses given at [facility]  prior to CIR admit (4/16) were  , , , , 3.5mg  ,3.5mg   from 4/10 -03/23/23.    Most recent INR trend at [facility]  4/14-4/16/24: INR 1.7-1.8-2.3 / repeat  2.2.  INR  2.4/ repeat 2.5 on 4/17 and today 4/18 INR is 2.9, therapeutic . Goal INR = 2.5-3.5.    Heparin drip discontinued on 4/17 due to therapeutic INR.   INR is still supratherapeutic today at 4.3. No bleeding today per Rn. We will continue to hold coumadin. Last dose was on 4/19. If cont to rise tomorrow, we may have to consider a small dose of vitamin k  Hgb 10.5, plt wnl  Goal of Therapy:  INR 2.5-3.5  Plan:  No coumadin today Monitor daily INR and CBC F/u resolution of epistaxis Monitor for signs and symptoms of bleeding, drug-drug interactions  Ulyses Southward, PharmD, [place], AAHIVP, CPP Infectious Disease Pharmacist 03/31/2023 10:42 AM

## 2023-03-31 NOTE — Significant Event (Signed)
Hypoglycemic Event  CBG: 37  Treatment: 8 oz juice/soda  Symptoms: None  Follow-up CBG: Time:1915 CBG Result: 53, 65, 114 final at 2050  Possible Reasons for Event: Inadequate meal intake  Comments/MD notified: MD notified    Colleen [name], Rawad Bochicchio ATKINSO

## 2023-03-31 NOTE — Progress Notes (Signed)
Patient with poor po intake despite liberalized diet. Will add nephro and family advised to bring additional food from home to help with intake.

## 2023-03-31 NOTE — Inpatient Diabetes Management (Addendum)
Inpatient Diabetes Program Recommendations  AACE/ADA: New Consensus Statement on Inpatient Glycemic Control (2015)  Target Ranges:  Prepandial:   less than 140 mg/dL      Peak postprandial:   less than 180 mg/dL (1-2 hours)      Critically ill patients:  140 - 180 mg/dL    Latest Reference Range & Units 03/30/23 06:09 03/30/23 09:35 03/30/23 11:52 03/30/23 19:00 03/30/23 19:30 03/30/23 19:49 03/30/23 20:50  Glucose-Capillary 70 - 99 mg/dL 604 (H) 540 (H)  Pt refused Novolog 223 (H)  12 units Novolog  37 (LL) 53 (L) 65 (L) 114 (H)  (LL): Data is critically low (H): Data is abnormally high (L): Data is abnormally low  Latest Reference Range & Units 03/31/23 06:22 03/31/23 11:21  Glucose-Capillary 70 - 99 mg/dL 981 (H)  1 units Novolog  Novolog Meal Coverage  HELD 252 (H)  (H): Data is abnormally high   Admitted to Rehab from [facility] on 03/24/2023  History: DM, ESRD  Home DM Meds Prior to Admission: Januvia 25 mg daily        70/30 Insulin 3-4 units BID  Current Orders: Novolog 0-6 units TID     Novolog 12 units with Breakfast     Novolog 10 units with Lunch     Novolog 10 units with Dinner   Per documentation, it appears pt is eating inconsistently Sometimes 70-100%, Sometimes 10-40%  MD- Perhaps pt would do better with lower dose of Novolog Meal Coverage with more specific parameters?  Recommend the following:   Reduce and Add the following parameters to pt's Novolog Meal Coverage regimen:  Novolog 6 units with Breakfast (give 3 units if pt eats some of meal but not 50% of meal)  Novolog 5 units with Lunch (give 2 units if pt eats some of meal but not 50% of meal)  Novolog 5 units with Dinner (give 2 units if pt eats some of meal but not 50% of meal)   Tried to call RN caring for pt but she was busy with another pt when I called.  I sent her a secure chat to review SSI and Novolog meal coverage parameters and timing of CBGs.      --Will follow patient during  hospitalization--  Ambrose Finland RN, MSN, CDCES Diabetes Coordinator Inpatient Glycemic Control Team Team Pager: [phone] (8a-5p)

## 2023-03-31 NOTE — Progress Notes (Signed)
Physical Therapy Session Note  Patient Details  Name: Colleen [name] MRN: [id number] Date of Birth: [birth year]  Today's Date: 03/31/2023 PT Individual Time: 0930-1026 and 4098-1191 PT Individual Time Calculation (min): 56 min and 40 min  Short Term Goals: Week 1:  PT Short Term Goal 1 (Week 1): pt will transfer sit<>stand with LRAD and min A PT Short Term Goal 2 (Week 1): pt will transfer bed<>chair with LRAD and min A PT Short Term Goal 3 (Week 1): pt will ambulate 37ft with LRAD and min A  Skilled Therapeutic Interventions/Progress Updates:   Treatment Session 1 Received pt sitting in WC, pt agreeable to PT treatment, and denied any pain during session. Session with emphasis on functional mobility/transfers, generalized strengthening and endurance, simulated car transfers, toileting, and ambulation. Pt on RA with SPO2 >93% throughout session. Pt transported to/from room in [facility] dependently for time management purposes. Pt stood from [facility] with RW and mod A (cues to push up through LEs) and ambulated to simulated car (~41ft) with RW and CGA with encouragement to continue as pt requesting to sit halfway to car. Pt performed simulated car transfer with RW and mod A overall. Pt required assist to get LEs into car, reporting car was "too high" but able to get them out of car without assist. Pt reports feeling urge to have BM every time she stands and reports it is due to therapists "pulling up on her" - encouraged pt to use her legs as much as possible to stand independently, but even so, pt continues to require min/mod A to stand. Pt requested to return to room due to urge to have BM. Pt declined ambulating into bathroom; therefore performed stand<>pivot to/from toilet with bedside commode over top with min A - cues to avoid pulling on grab bar to adhere to sternal precautions. Pt required total A for clothing management, requesting therapist's assist. Pt required significantly increased time on toilet,  but was continent of bowel (see flowsheets). Pt required total A for pericare in standing and discussed equipment for D/C. Pt requesting handicap parking pass information and will need a RW and 18x18 manual WC with bilateral elevating legrests - CSW notified. Concluded session with pt sitting in [facility] with all needs within reach.   Treatment Session 2 Received pt sitting in [facility] with family present at bedside. Pt denied any pain during session but requesting to work on "leg exercises" in room this afternoon due to increased edema in bilateral feet. Pt performed the following seated exercises with emphasis on LE strength/ROM:  -LAQ 2x15 bilaterally -hip flexion 2x15 bilaterally -hip abduction with red TB 2x10 bilaterally -heel/toe raises x20 bilaterally  Attempted x1 to stand from [facility] with RW, however once upright pt immediately sat back down, reporting feeling too "tight" and requested to return to bed. Stood from [facility] with RW and mod A and transferred WC<>bed stand<>pivot with RW and min A. Doffed pants/brief in standing with max A per pt requested and pt doffed button down shirt and donned gown with supervision. Transferred sit<>supine with mod A for BLE management and spent increased time repositioning pillows in bed for comfort. Concluded session with pt sitting upright in bed, needs within reach, and bed alarm on. BLEs elevated for edema management and pillows placed under R arm and behind back/neck.   Therapy Documentation Precautions:  Precautions Precautions: Sternal, Fall, ICD/Pacemaker Precaution Comments: Anxious. Monitor (O2 > 90%), and HR Restrictions Weight Bearing Restrictions: No Other Position/Activity Restrictions: sternal precautions  Therapy/Group:  Individual Therapy Martin Majestic PT, DPT  03/31/2023, 7:10 AM

## 2023-03-31 NOTE — Progress Notes (Signed)
Pt slept well this shift. Only woke a couple times for scheduled cares. Atarax/trazodone effective. Sleep improved compared to night of 4/21 and 4/22.  Mylo Red, LPN

## 2023-03-31 NOTE — Progress Notes (Signed)
PROGRESS NOTE   Subjective/Complaints: Patient is concerned about the amount of Novolog she is receiving with meals and feels this may have contributed to her hypoglycemia last night  ROS: Patient denies fever, rash, sore throat, blurred vision, dizziness, nausea, vomiting, diarrhea, cough, shortness of breath or chest pain, joint or back/neck pain, headache, +epstaxis   Objective:   No results found. Recent Labs    03/29/23 0614 03/30/23 0745  WBC 7.9 8.3  HGB 9.7* 10.5*  HCT 31.9* 34.1*  PLT 192 246   Recent Labs    03/29/23 0614 03/30/23 0745  NA 129* 130*  K 3.7 4.4  CL 92* 91*  CO2 23 20*  GLUCOSE 214* 163*  BUN 31* 41*  CREATININE 5.62* 6.72*  CALCIUM 7.6* 8.0*    Intake/Output Summary (Last 24 hours) at 03/31/2023 1045 Last data filed at 03/31/2023 4098 Gross per 24 hour  Intake 236 ml  Output 2600 ml  Net -2364 ml     Pressure Injury 03/24/23 Buttocks Left Stage 2 -  Partial thickness loss of dermis presenting as a shallow open injury with a red, pink wound bed without slough. (Active)  03/24/23 1500  Location: Buttocks  Location Orientation: Left  Staging: Stage 2 -  Partial thickness loss of dermis presenting as a shallow open injury with a red, pink wound bed without slough.  Wound Description (Comments):   Present on Admission: Yes    Physical Exam: Vital Signs Blood pressure 101/63, pulse (!) 104, temperature 97.9 F (36.6 C), temperature source Oral, resp. rate 18, height 5' 3.86" (1.622 m), weight 75.6 kg, last menstrual period 12/08/2013, SpO2 92 %. Constitutional: No distress . Vital signs reviewed. HEENT: NCAT, EOMI, oral membranes moist, epistaxis from left nostril, packed Neck: supple Cardiovascular: Tachycardic without murmur. No JVD    Respiratory/Chest: CTA Bilaterally without wheezes or rales. Normal effort    GI/Abdomen: BS +, non-tender, non-distended Ext: no clubbing,  cyanosis, or edema Psych: flat to anxious  Musculoskeletal:     Cervical back: Neck supple. No tenderness.     Comments: Ue's 5-/5 B/L HF/KE 3+/5 B/L; DF/PF 4+/5 B/L  Skin:    General: chest wall incisions CDI    Comments: 2-3+ edema BLE- and some wooding in hands and arms- from reduced swelling--stable to improved L buttock pressure ulcer- Stage II- ~1.5 x 0.5 cm- pink- slightly open Incision closed on R chest and L chest from prior HD catheters Fistula L upper arm- (+) thrill  +LE edema Neurological:     Mental Status: She is alert.     Comments: Right facial weakness without dysarthria. Speech slow but clear. Oriented X 4. Intact to light touch in all 4 extremities per pt  Fully oriented       Assessment/Plan: 1. Functional deficits which require 3+ hours per day of interdisciplinary therapy in a comprehensive inpatient rehab setting. Physiatrist is providing close team supervision and 24 hour management of active medical problems listed below. Physiatrist and rehab team continue to assess barriers to discharge/monitor patient progress toward functional and medical goals  Care Tool:  Bathing    Body parts bathed by patient: Right arm, Left arm, Chest, Abdomen,  Face, Right upper leg, Left upper leg   Body parts bathed by helper: Front perineal area, Buttocks, Right lower leg, Left lower leg     Bathing assist Assist Level: Supervision/Verbal cueing     Upper Body Dressing/Undressing Upper body dressing   What is the patient wearing?: Button up shirt    Upper body assist Assist Level: Minimal Assistance - Patient > 75%    Lower Body Dressing/Undressing Lower body dressing      What is the patient wearing?: Incontinence brief, Pants     Lower body assist Assist for lower body dressing: Moderate Assistance - Patient 50 - 74%     Toileting Toileting    Toileting assist Assist for toileting: Moderate Assistance - Patient 50 - 74%     Transfers Chair/bed  transfer  Transfers assist  Chair/bed transfer activity did not occur: Safety/medical concerns (weakness, fear)  Chair/bed transfer assist level: Minimal Assistance - Patient > 75%     Locomotion Ambulation   Ambulation assist   Ambulation activity did not occur: Safety/medical concerns (fatigue, fear, generalized weakness)  Assist level: Minimal Assistance - Patient > 75% Assistive device: Walker-rolling Max distance: 55   Walk 10 feet activity   Assist  Walk 10 feet activity did not occur: Safety/medical concerns (fatigue, fear, generalized weakness)  Assist level: Minimal Assistance - Patient > 75% Assistive device: Walker-rolling   Walk 50 feet activity   Assist Walk 50 feet with 2 turns activity did not occur: Safety/medical concerns (fatigue, fear, generalized weakness)  Assist level: Minimal Assistance - Patient > 75% Assistive device: Walker-rolling    Walk 150 feet activity   Assist Walk 150 feet activity did not occur: Safety/medical concerns (fatigue, fear, generalized weakness)         Walk 10 feet on uneven surface  activity   Assist Walk 10 feet on uneven surfaces activity did not occur: Safety/medical concerns (fatigue, fear, generalized weakness)         Wheelchair     Assist Is the patient using a wheelchair?: Yes Type of Wheelchair: Manual Wheelchair activity did not occur: Safety/medical concerns (fatigue, fear, generalized weakness)         Wheelchair 50 feet with 2 turns activity    Assist    Wheelchair 50 feet with 2 turns activity did not occur: Safety/medical concerns (fatigue, fear, generalized weakness)       Wheelchair 150 feet activity     Assist  Wheelchair 150 feet activity did not occur: Safety/medical concerns (fatigue, fear, generalized weakness)       Blood pressure 101/63, pulse (!) 104, temperature 97.9 F (36.6 C), temperature source Oral, resp. rate 18, height 5' 3.86" (1.622 m), weight  75.6 kg, last menstrual period 12/08/2013, SpO2 92 %.    Medical Problem List and Plan: 1. Functional deficits secondary to debility after MV repair with sternal precautions             -patient may  shower if cover incisions- HD catheters in chest are out- also has sternal incisions that need to be covered             -ELOS/Goals: 10-14 days supervision to min A  Continue CIR therapies including PT, OT  2.  Mechanical MVR: INR therapeutic and heparin drip d/ced.  Pharmaceutical: Coumadin  --INR supra-therapeutic this am             -antiplatelet therapy: ASA 3. Pain Management:  Tylenol prn. Lidocaine patches prn. Denies pain 4. Insomnia:  increase melatonin to  HS 4/20  -Charge nurse spoke with her about PM nursing care.  5. Anxiety: neuropsych consulted 6. Skin/Wound Care: Routine pressure relief measures with frequent pressure relief measures for sacral decub.             --Interdry to skin folds.              --Add vitamin C and Zinc to promote wound healing 7. Fluids/Electrolytes/Nutrition: Strict I/O. Labs with HD.  --Will keep diet as regular to help with food choices             --Juven and nephro added. 8. MVR/TVR by Dr. Felipa Furnace [phone]): Continue Sternal precautions.              --PPM for CHB             --coumadin with INR goal 2.5-3.5 9.  Hypotension: Monitor for symptoms with increase in activity             --on midodrine, Staterra and Droxidopa.  10.  ESRD: Schedule HD at the end of the day. Renal diet with 1200 cc FR.  11. T2DM: Monitor BS 5 X day with novolog 12 units w/breakfast and 15 units BID. Provided list of foods that are good for diabetes. D/c HS SSI.  12. A fib: Monitor HR TID. No BB due to hypotension. HR continues to be elevated, continue to monitor             --on coumadin. 13. Anemia of chronic disease: Monitor H/H with HD labs/             --transfuse prn Hgb <7.0 or if symptomatic.  14. Anxiety- pt cannot tolerate door/curtain closed 15. New  O2 requirement with mild tachypnea/volume overload -  -volume mgt per nephrology with HD  -continue oxygen as needed--humidfy 16. Overweight: provide dietary education 17. Vitamin D insufficiency: continue ergocalciferol 50,000U once per week for 7 weeks 18. Shortness of breath: improved, reviewed CXR results with her that show pleural effusion and atelectasis, incentive spirometer ordered 19. Epistaxis left nostril--likely exacerbated by O2 [place] and elevated INR  -Hgb reviewed and trending upward  -hold coumadin today  -will schedule afrin  -pack nostril with 2x2 gauze  -humidified O2  -ocean nasal spray as possible 20. Tachycardia: continue to monitor HR TID    LOS: 7 days A FACE TO FACE EVALUATION WAS PERFORMED  Clint Bolder P Atziry Baranski 03/31/2023, 10:45 AM

## 2023-04-01 DIAGNOSIS — R5381 Other malaise: Secondary | ICD-10-CM | POA: Diagnosis not present

## 2023-04-01 LAB — GLUCOSE, CAPILLARY
Glucose-Capillary: 96 mg/dL (ref 70–99)
Glucose-Capillary: 99 mg/dL (ref 70–99)
Glucose-Capillary: 90 mg/dL (ref 70–99)
Glucose-Capillary: 77 mg/dL (ref 70–99)
Glucose-Capillary: 83 mg/dL (ref 70–99)

## 2023-04-01 LAB — CBC
HCT: 34.2 % — ABNORMAL LOW (ref 36.0–46.0)
Hemoglobin: 10.6 g/dL — ABNORMAL LOW (ref 12.0–15.0)
MCH: 28 pg (ref 26.0–34.0)
MCHC: 31 g/dL (ref 30.0–36.0)
MCV: 90.2 fL (ref 80.0–100.0)
Platelets: 273 10*3/uL (ref 150–400)
RBC: 3.79 MIL/uL — ABNORMAL LOW (ref 3.87–5.11)
RDW: 18.1 % — ABNORMAL HIGH (ref 11.5–15.5)
WBC: 9.7 10*3/uL (ref 4.0–10.5)
nRBC: 1.8 % — ABNORMAL HIGH (ref 0.0–0.2)

## 2023-04-01 LAB — RENAL FUNCTION PANEL
Albumin: 2.5 g/dL — ABNORMAL LOW (ref 3.5–5.0)
Anion gap: 14 (ref 5–15)
BUN: 12 mg/dL (ref 6–20)
CO2: 27 mmol/L (ref 22–32)
Calcium: 7.7 mg/dL — ABNORMAL LOW (ref 8.9–10.3)
Chloride: 94 mmol/L — ABNORMAL LOW (ref 98–111)
Creatinine, Ser: 2.55 mg/dL — ABNORMAL HIGH (ref 0.44–1.00)
GFR, Estimated: 21 mL/min — ABNORMAL LOW (ref >60)
Glucose, Bld: 83 mg/dL (ref 70–99)
Phosphorus: 1.6 mg/dL — ABNORMAL LOW (ref 2.5–4.6)
Potassium: 3 mmol/L — ABNORMAL LOW (ref 3.5–5.1)
Sodium: 135 mmol/L (ref 135–145)

## 2023-04-01 LAB — PROTIME-INR
INR: 4.3 (ref 0.8–1.2)
Prothrombin Time: 41.2 seconds — ABNORMAL HIGH (ref 11.4–15.2)

## 2023-04-01 MED ORDER — HEPARIN SODIUM (PORCINE) 1000 UNIT/ML DIALYSIS
20.0000 [IU]/kg | INTRAMUSCULAR | Status: DC | PRN
  Filled 2023-04-01: qty 2

## 2023-04-01 NOTE — Progress Notes (Signed)
Occupational Therapy Weekly Progress Note  Patient Details  Name: Colleen [name] MRN: [id number] Date of Birth: [birth year]  Beginning of progress report period: March 25, 2023 End of progress report period: April 01, 2023  Patient has met 2 of 3 short term goals. Pt is making slow progress towards LTGs. She is able to bathe at an overall mod A level, dress UB with min A, LB with mod A and requires up to mod assist for toileting tasks. She has progressed from requiring 2.5 L supplemental O2, to now tolerating RA though requires extended rest for rebounding to WNL. Pt continues to demonstrate deficits in endurance, coordination, sternal precaution adherence, balance and global strength resulting in difficulty completing BADL tasks without increased physical assist. She is often resistive to education and cues which greatly challenges pt's functional outcomes. Pt will benefit from continued skilled OT services to focus on mentioned deficits. Family will need to be present for training prior to pt DC to ensure safe transition home.   Patient continues to demonstrate the following deficits: muscle weakness, decreased cardiorespiratoy endurance, decreased coordination, decreased awareness, decreased problem solving, and decreased memory, and decreased standing balance and difficulty maintaining precautions and therefore will continue to benefit from skilled OT intervention to enhance overall performance with BADL and Reduce care partner burden.  Patient progressing toward long term goals..  Continue plan of care.  OT Short Term Goals Week 1:  OT Short Term Goal 1 (Week 1): Pt will complete 1/3 toileting steps with CGA OT Short Term Goal 1 - Progress (Week 1): Not met OT Short Term Goal 2 (Week 1): Pt will complete sit > stand in prep for ADL with max A using LRAD OT Short Term Goal 2 - Progress (Week 1): Met OT Short Term Goal 3 (Week 1): Pt will complete transfer with mod A of 1 using LRAD to  promote OOB toileting OT Short Term Goal 3 - Progress (Week 1): Met Week 2:  OT Short Term Goal 1 (Week 2): STG = LTG due to ELOS   Colleen [name] Colleen Elysse Polidore, MS, OTR/L  04/01/2023, 12:19 PM

## 2023-04-01 NOTE — Progress Notes (Signed)
[facility] Progress Note   Subjective:    HD yest -  removed 2600-  had agreed to an extra treatment but just dont have the capacity to run her today in the HD unit-  plan for HD later today  then maybe Thursday for extra tx if needed -  she is ok with plan  Objective Vitals:   04/01/23 0209 04/01/23 0500 04/01/23 0602 04/01/23 0949  BP: 110/84  105/64 136/73  Pulse: 93  (!) 105 91  Resp: Temp: 97.9 F (36.6 C)  98 F (36.7 C) (!) 97.2 F (36.2 C)  TempSrc:   Oral Axillary  SpO2: 98%  93% 91%  Weight:  73.4 kg    Height:       Physical Exam General: Well appearing woman, NAD but breathing is mildly labored when talking. Now on RA-  right facial asymetry Heart:RRR; no murmur; healing mid-line chest incision Lungs:Fine bibasilar rales present (sounds slightly improved from yesterday), clear in upper lobes Abdomen: soft Extremities: 2+ BLE edema and 2+ RUE edema Dialysis Access: LUE AVF + bruit  Filed Weights   03/30/23 1758 03/31/23 0531 04/01/23 0500  Weight: 76.4 kg 75.6 kg 73.4 kg    Intake/Output Summary (Last 24 hours) at 04/01/2023 1120 Last data filed at 04/01/2023 0717 Gross per 24 hour  Intake 490 ml  Output --  Net 490 ml    Additional Objective Labs: Basic Metabolic Panel: Recent Labs  Lab 03/27/23 0525 03/29/23 0614 03/30/23 0745  NA 132* 129* 130*  K 4.3 3.7 4.4  CL 93* 92* 91*  CO2 25 23 20*  GLUCOSE 120* 214* 163*  BUN 34* 31* 41*  CREATININE 5.30* 5.62* 6.72*  CALCIUM 8.2* 7.6* 8.0*  PHOS 4.7* 4.6 5.4*   Liver Function Tests: Recent Labs  Lab 03/27/23 0525 03/29/23 0614 03/30/23 0745  ALBUMIN 2.2* 2.3* 2.4*   No results for input(s): "LIPASE", "AMYLASE" in the last 168 hours. CBC: Recent Labs  Lab 03/26/23 0557 03/27/23 0525 03/28/23 0610 03/29/23 0614 03/30/23 0745  WBC 7.8 7.7 7.6 7.9 8.3  NEUTROABS  --  5.8  --   --   --   HGB 9.9* 9.5* 10.2* 9.7* 10.5*  HCT 32.9* 31.3* 32.5* 31.9* 34.1*  MCV  96.5 94.3 91.8 93.5 92.7  PLT 194 184 179 192 246   Blood Culture    Component Value Date/Time   SDES BLOOD RIGHT HAND 12/11/2020 1151   SPECREQUEST  12/11/2020 1151    BOTTLES DRAWN AEROBIC AND ANAEROBIC Blood Culture adequate volume   CULT  12/11/2020 1151    NO GROWTH 5 DAYS Performed at [facility], [address], [place], [place] [address]    REPTSTATUS 12/16/2020 FINAL 12/11/2020 1151    Cardiac Enzymes: No results for input(s): "CKTOTAL", "CKMB", "CKMBINDEX", "TROPONINI" in the last 168 hours. CBG: Recent Labs  Lab 03/31/23 1624 03/31/23 1748 03/31/23 2043 04/01/23 0354 04/01/23 0609  GLUCAP 198* 158* 83 96 99   Iron Studies: No results for input(s): "IRON", "TIBC", "TRANSFERRIN", "FERRITIN" in the last 72 hours. Lab Results  Component Value Date   INR 4.3 (HH) 04/01/2023   INR 4.3 (HH) 03/31/2023   INR 3.9 (H) 03/30/2023   Studies/Results: No results found.  Medications:   amiodarone  200 mg Oral Daily   aspirin EC  81 mg Oral Daily   atomoxetine  18 mg Oral Daily   Chlorhexidine Gluconate Cloth  6 each Topical Q0600  cinacalcet  60 mg Oral Q M,W,F   darbepoetin (ARANESP) injection - DIALYSIS  60 mcg Subcutaneous Q Fri-1800   doxercalciferol  3 mcg Intravenous Q M,W,F-HD   droxidopa  500 mg Oral TID WC   famotidine  10 mg Oral BID   feeding supplement (NEPRO CARB STEADY)  237 mL Oral TID WC   insulin aspart  5 Units Subcutaneous BID AC   insulin aspart  6 Units Subcutaneous QPC breakfast   melatonin  10 mg Oral QHS   midodrine  20 mg Oral TID WC   multivitamin  1 tablet Oral QHS   nutrition supplement (JUVEN)  1 packet Oral BID BM   polycarbophil  625 mg Oral Daily   simethicone  80 mg Oral QID   sodium chloride  1 spray Each Nare TID PC   Vitamin D (Ergocalciferol)  50,000 Units Oral Q7 days   Warfarin - Pharmacist Dosing Inpatient   Does not apply q1600    Dialysis Orders: MWF SW 3h  400/1.5  69.5kg 2K/2Ca bath  Heparin 3000  AVF  LUA - last OP HD 3/13, post wt 69.5kg - sensipar  po tiw - hectorol 3 mcg IV tiw - venofer 50 mg IV weekly   62yo F s/p [facility] admit 3/13-4/16/24 for MV replacement, TV ring, AV decalcification with removal of LV mass. Post-op, she have A-fib and CHB, underwent leadless PPM on 02/26/23. Hospitalization c/b hypotension, on midodrine + Strattera + Droxidopa. Will be on warfarin + ASA for coagulation. Transferred to CIR for rehab.   Assessment/Plan: 1. Valvular heart disease s/p MVR, TV ring, AoV decalcification with removal LV mass 02/18/23: On warfarin for anticoagulation. 2. ESRD: Will continue outpatient HD schedule - next HD 4/24. She refuses to run > 3hr treatment, refusing extra HD for volume removal-I  had a plan to just inc HD time by 15 min and do UF only but then she told me that she would do an extra 2 hour uf to pull fluid but were unable to do Tues due to high HD census, possibly for Thursdaay-  she is concerned that her BP is so low-  I told her we just need to navigate this new normal for her.   Plan to raise Heparin bolus to 4,000 units, but given recent epistaxis, will hold off on mid-run dose for now. Will need to monitor this closely.  3. HYPOTN/volume: On midodrine  TID, droxidopa  TID, and atomoxetine  daily for hypotension. She is very edematous - will see what we can get off with dialysis, hopefully start slowly getting her weight down. 4. Anemia of ESRD: Hgb 9.7 -  last Aranesp on 4/19. Monitor trend, will raise if no improvement. 5. Secondary hyperparathyroidism:  Corr/Phos at goal, on cinacalcet  TIW and hectorol. Hold binders for now since phos is reasonable  6. Nutrition:  On protein supplements 7. T2DM: On insulin, per primary. 8. A-fib: On amiodarone. 9. Debility: In CIR - follow progress.  [name]  [facility] 04/01/2023,11:20 AM  LOS: 8 days

## 2023-04-01 NOTE — Progress Notes (Signed)
Physical Therapy Weekly Progress Note  Patient Details  Name: Colleen [name] MRN: [id number] Date of Birth: 20-Mar-1961  Beginning of progress report period: March 25, 2023 End of progress report period: April 01, 2023  Patient has met 2 of 3 short term goals. Pt demonstrates slow progress towards long term goals. Pt currently requires min A for supine<>sit and mod A for sit<>supine using bed features. Pt requires min/mod A for sit<>stands with RW, min A for stand<>pivot transfers, and min A to ambulate 70ft with RW. Pt continues to be limited by global deconditioning/weakness, self limiting behaviors (fear and unwillingness to try certain activities), bilateral LE edema, difficulty adhering to sternal precautions, and fatigue. Pt will benefit from family education prior to discharge.   Patient continues to demonstrate the following deficits muscle weakness and muscle joint tightness, decreased cardiorespiratoy endurance and decreased oxygen support, and decreased standing balance, decreased postural control, decreased balance strategies, and difficulty maintaining precautions and therefore will continue to benefit from skilled PT intervention to increase functional independence with mobility.  Patient progressing toward long term goals..  Continue plan of care.  PT Short Term Goals Week 1:  PT Short Term Goal 1 (Week 1): pt will transfer sit<>stand with LRAD and min A PT Short Term Goal 1 - Progress (Week 1): Progressing toward goal PT Short Term Goal 2 (Week 1): pt will transfer bed<>chair with LRAD and min A PT Short Term Goal 2 - Progress (Week 1): Met PT Short Term Goal 3 (Week 1): pt will ambulate 44ft with LRAD and min A PT Short Term Goal 3 - Progress (Week 1): Met Week 2:  PT Short Term Goal 1 (Week 2): STG=LTG due to LOS  Skilled Therapeutic Interventions/Progress Updates:  Ambulation/gait training;Discharge planning;Functional mobility training;Psychosocial support;Therapeutic  Activities;Balance/vestibular training;Disease management/prevention;Neuromuscular re-education;Skin care/wound management;Therapeutic Exercise;Wheelchair propulsion/positioning;Cognitive remediation/compensation;DME/adaptive equipment instruction;Pain management;Community reintegration;Patient/family education;Splinting/orthotics;UE/LE Strength taining/ROM;Stair training;UE/LE Coordination activities   Therapy Documentation Precautions:  Precautions Precautions: Sternal, Fall, ICD/Pacemaker Precaution Comments: Anxious. Monitor (O2 > 90%), and HR Restrictions Weight Bearing Restrictions: No Other Position/Activity Restrictions: sternal precautions  Therapy/Group: Individual Therapy Martin Majestic PT, DPT  04/01/2023, 7:01 AM

## 2023-04-01 NOTE — Progress Notes (Signed)
Received patient in bed to unit.  Alert and oriented.  Informed consent signed and in chart.   TX duration:3  Patient tolerated well.  Transported back to the room  Alert, without acute distress.  Hand-off given to patient's nurse.   Access used: left AVF Access issues: none  Total UF removed: 3.5L Medication(s) given: hectorol    04/01/23 1713  Vitals  Temp 98.4 F (36.9 C)  Temp Source Oral  BP (!) 102/59  MAP (mmHg) 73  BP Location Right Arm  BP Method Automatic  Patient Position (if appropriate) Lying  Pulse Rate 91  Pulse Rate Source Monitor  ECG Heart Rate 91  Resp (!) 26  Oxygen Therapy  SpO2 100 %  O2 Device Room Air  During Treatment Monitoring  HD Safety Checks Performed Yes  Intra-Hemodialysis Comments Tx completed;Tolerated well  Dialysis Fluid Bolus Normal Saline  Bolus Amount (mL) 300 mL      [facility]

## 2023-04-01 NOTE — Progress Notes (Signed)
Right upper chest and sternotomy incisions continue to heal nicely. I removed Nylon sutures x 3 from chest tube sites. Right lateral site with skin edge separation and a small amount of crust and adherent exudate cleaned off with sterile NS. No signs of infection. Foam border dressing applied.

## 2023-04-01 NOTE — Progress Notes (Signed)
PROGRESS NOTE   Subjective/Complaints: Continues to be off O2! CBGs are excellent! Decreased checks to [facility] and d/c HSS SSI. Discussed plan to get off SSI first since she will not have these at home  ROS: Patient denies fever, rash, sore throat, blurred vision, dizziness, nausea, vomiting, diarrhea, cough, shortness of breath or chest pain, joint or back/neck pain, headache, epistaxis resolved   Objective:   No results found. Recent Labs    03/30/23 0745  WBC 8.3  HGB 10.5*  HCT 34.1*  PLT 246   Recent Labs    03/30/23 0745  NA 130*  K 4.4  CL 91*  CO2 20*  GLUCOSE 163*  BUN 41*  CREATININE 6.72*  CALCIUM 8.0*    Intake/Output Summary (Last 24 hours) at 04/01/2023 1048 Last data filed at 04/01/2023 8119 Gross per 24 hour  Intake 490 ml  Output --  Net 490 ml     Pressure Injury 03/24/23 Buttocks Left Stage 2 -  Partial thickness loss of dermis presenting as a shallow open injury with a red, pink wound bed without slough. (Active)  03/24/23 1500  Location: Buttocks  Location Orientation: Left  Staging: Stage 2 -  Partial thickness loss of dermis presenting as a shallow open injury with a red, pink wound bed without slough.  Wound Description (Comments):   Present on Admission: Yes    Physical Exam: Vital Signs Blood pressure 136/73, pulse 91, temperature (!) 97.2 F (36.2 C), temperature source Axillary, resp. rate 19, height 5' 3.86" (1.622 m), weight 73.4 kg, last menstrual period 12/08/2013, SpO2 91 %. Constitutional: No distress . Vital signs reviewed. BMI 27.90 HEENT: NCAT, EOMI, oral membranes moist, no more epistaxis Neck: supple Cardiovascular: Tachycardic without murmur. No JVD    Respiratory/Chest: CTA Bilaterally without wheezes or rales. Normal effort    GI/Abdomen: BS +, non-tender, non-distended Ext: no clubbing, cyanosis, or edema Psych: flat to anxious  Musculoskeletal:     Cervical  back: Neck supple. No tenderness.     Comments: Ue's 5-/5 B/L HF/KE 3+/5 B/L; DF/PF 4+/5 B/L  Skin:    General: chest wall incisions CDI    Comments: 2-3+ edema BLE- and some wooding in hands and arms- from reduced swelling--stable to improved L buttock pressure ulcer- Stage II- ~1.5 x 0.5 cm- pink- slightly open Incision closed on R chest and L chest from prior HD catheters Fistula L upper arm- (+) thrill  +LE edema Neurological:     Mental Status: She is alert.     Comments: Right facial weakness without dysarthria. Speech slow but clear. Oriented X 4. Intact to light touch in all 4 extremities per pt  Fully oriented       Assessment/Plan: 1. Functional deficits which require 3+ hours per day of interdisciplinary therapy in a comprehensive inpatient rehab setting. Physiatrist is providing close team supervision and 24 hour management of active medical problems listed below. Physiatrist and rehab team continue to assess barriers to discharge/monitor patient progress toward functional and medical goals  Care Tool:  Bathing    Body parts bathed by patient: Right arm, Left arm, Chest, Abdomen, Face, Right upper leg, Left upper leg  Body parts bathed by helper: Front perineal area, Buttocks, Right lower leg, Left lower leg     Bathing assist Assist Level: Supervision/Verbal cueing     Upper Body Dressing/Undressing Upper body dressing   What is the patient wearing?: Button up shirt    Upper body assist Assist Level: Minimal Assistance - Patient > 75%    Lower Body Dressing/Undressing Lower body dressing      What is the patient wearing?: Incontinence brief, Pants     Lower body assist Assist for lower body dressing: Moderate Assistance - Patient 50 - 74%     Toileting Toileting    Toileting assist Assist for toileting: Moderate Assistance - Patient 50 - 74%     Transfers Chair/bed transfer  Transfers assist  Chair/bed transfer activity did not occur:  Safety/medical concerns (weakness, fear)  Chair/bed transfer assist level: Contact Guard/Touching assist     Locomotion Ambulation   Ambulation assist   Ambulation activity did not occur: Safety/medical concerns (fatigue, fear, generalized weakness)  Assist level: Contact Guard/Touching assist Assistive device: Walker-rolling Max distance: 11ft   Walk 10 feet activity   Assist  Walk 10 feet activity did not occur: Safety/medical concerns (fatigue, fear, generalized weakness)  Assist level: Contact Guard/Touching assist Assistive device: Walker-rolling   Walk 50 feet activity   Assist Walk 50 feet with 2 turns activity did not occur: Safety/medical concerns (fatigue, fear, generalized weakness)  Assist level: Minimal Assistance - Patient > 75% Assistive device: Walker-rolling    Walk 150 feet activity   Assist Walk 150 feet activity did not occur: Safety/medical concerns (fatigue, fear, generalized weakness)         Walk 10 feet on uneven surface  activity   Assist Walk 10 feet on uneven surfaces activity did not occur: Safety/medical concerns (fatigue, fear, generalized weakness)         Wheelchair     Assist Is the patient using a wheelchair?: Yes Type of Wheelchair: Manual Wheelchair activity did not occur: Safety/medical concerns (fatigue, fear, generalized weakness)         Wheelchair 50 feet with 2 turns activity    Assist    Wheelchair 50 feet with 2 turns activity did not occur: Safety/medical concerns (fatigue, fear, generalized weakness)       Wheelchair 150 feet activity     Assist  Wheelchair 150 feet activity did not occur: Safety/medical concerns (fatigue, fear, generalized weakness)       Blood pressure 136/73, pulse 91, temperature (!) 97.2 F (36.2 C), temperature source Axillary, resp. rate 19, height 5' 3.86" (1.622 m), weight 73.4 kg, last menstrual period 12/08/2013, SpO2 91 %.    Medical Problem List and  Plan: 1. Functional deficits secondary to debility after MV repair with sternal precautions             -patient may  shower if cover incisions- HD catheters in chest are out- also has sternal incisions that need to be covered             -ELOS/Goals: 10-14 days supervision to min A  Continue CIR therapies including PT, OT  2.  Mechanical MVR: INR therapeutic and heparin drip d/ced.  Pharmaceutical: Coumadin  --INR supra-therapeutic this am             -antiplatelet therapy: ASA 3. Pain Management:  Tylenol prn. Lidocaine patches prn. Denies pain 4. Insomnia: increase melatonin to  HS 4/20  -Charge nurse spoke with her about PM nursing care.  5.  Anxiety: neuropsych consulted 6. Skin/Wound Care: Routine pressure relief measures with frequent pressure relief measures for sacral decub.             --Interdry to skin folds.              --Add vitamin C and Zinc to promote wound healing 7. Fluids/Electrolytes/Nutrition: Strict I/O. Labs with HD.  --Will keep diet as regular to help with food choices             --Juven and nephro added. 8. MVR/TVR by Dr. Felipa Furnace [phone]): Continue Sternal precautions.              --PPM for CHB             --coumadin with INR goal 2.5-3.5 9.  Hypotension: Monitor for symptoms with increase in activity             --on midodrine, Staterra and Droxidopa. Blood pressures reviewed and have improved 10.  ESRD: Schedule HD at the end of the day. Renal diet with 1200 cc FR.  11. T2DM: Monitor BS 5 X day with novolog 12 units w/breakfast and 15 units BID. Provided list of foods that are good for diabetes. D/c HS SSI. D/c daytime ISS  12. A fib: Monitor HR TID. No BB due to hypotension. HR continues to be elevated, continue to monitor             --on coumadin. 13. Anemia of chronic disease: Monitor H/H with HD labs/             --transfuse prn Hgb <7.0 or if symptomatic.  14. Anxiety- pt cannot tolerate door/curtain closed 15. New O2 requirement with mild  tachypnea/volume overload -  -volume mgt per nephrology with HD  -continue oxygen as needed--humidfy 16. Overweight: provide dietary education 17. Vitamin D insufficiency: continue ergocalciferol 50,000U once per week for 7 weeks 18. Shortness of breath: improved, reviewed CXR results with her that show pleural effusion and atelectasis, incentive spirometer ordered 19. Epistaxis left nostril--likely exacerbated by O2 [place] and elevated INR  -Hgb reviewed and trending upward  -hold coumadin today  -will schedule afrin  -pack nostril with 2x2 gauze  -humidified O2  -ocean nasal spray as possible 20. Tachycardia: continue to monitor HR TID 21. Overweight: provide dietary education    LOS: 8 days A FACE TO FACE EVALUATION WAS PERFORMED  Drema Pry Drianna Chandran 04/01/2023, 10:48 AM

## 2023-04-01 NOTE — Progress Notes (Signed)
ANTICOAGULATION CONSULT NOTE   Pharmacy Consult for Warfarin , INR goal 2.5-3.5  Indication: Mechanical MVR  Allergies  Allergen Reactions   Farxiga [Dapagliflozin] Other (See Comments)    Cannot take due to kidneys   Hydrocodone Other (See Comments)    "Increase Glucose level," per pt   Kombiglyze Xr [Saxagliptin-Metformin Er] Other (See Comments)    Cannot take due to kidneys    Patient Measurements: Height: 5' 3.86" (162.2 cm) (from [facility] records on 02/18/23) Weight: 73.4 kg (161 lb 13.1 oz) IBW/kg (Calculated) : 54.37 Heparin Dosing Weight: 70.1 kg   Vital Signs: Temp: 98 F (36.7 C) (04/24 0602) Temp Source: Oral (04/24 0602) BP: 105/64 (04/24 0602) Pulse Rate: 105 (04/24 0602)  Labs: Recent Labs    03/30/23 0745 03/31/23 0919 04/01/23 0601  HGB 10.5*  --   --   HCT 34.1*  --   --   PLT 246  --   --   LABPROT 37.7* 40.9* 41.2*  INR 3.9* 4.3* 4.3*  CREATININE 6.72*  --   --      Estimated Creatinine Clearance: 8.7 mL/min (A) (by C-G formula based on SCr of 6.72 mg/dL (H)).   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 Then developed CHB and afib post-op and on 3/21 underwent leadless PPM placement.   Pharmacy consult for IV heparin and Warfarin , goal INR 2.5-3.5 for mechantcal MVR.   She also  has a history of DVT/PE. 2016, 2019)  Patient reports she was not taking any anticoagulation prior to the [facility] admission, and it has been years since she has taken Eliquis.   At [facility] she was started on Warfarin .  She received Heparin IV 1300 units/hr 4/13-4/15 then hehparin drip was discontinued on  4/15 (PTT was 51.3 sec on 4/15) , when INR 1.8.  Today the INR = 2.3 (4/16 AM) at [facility].  Goal INR 2.5-3.5 INR drawn  admit 4/16 = 2.2 She received  Warfarin   , , , , 3.5mg  ,3.5mg   from 4/10 -03/23/23 at [facility],  Per [facility] records , Warfarin started ~02/21/23  s/p mechanical MVR 02/19/23.    Amiodarone has potential drug-drug interaction(DDI) w/Warfarin,  to increase hypoprothrombinemic effect of Warfarin. Monitoring this by protime Danae [place] labs.  Per [facility]  records Amiodarone  started  ~3/21 w/load, Amio  QD since 4/10 and Warfarin started ~02/21/23 at [facility]), thus not a new /additional med.   [facility]  records reviewed, found on warfarin 2.5mg  daily as of 02/21/23 and most recent doses given at [facility]  prior to CIR admit (4/16) were  , , , , 3.5mg  ,3.5mg   from 4/10 -03/23/23.    Most recent INR trend  at [facility]  4/14-4/16/24: INR 1.7-1.8-2.3 / repeat 2.2.  INR  2.4/ repeat 2.5 on 4/17 and today 4/18 INR is 2.9, therapeutic . Goal INR = 2.5-3.5.    Heparin drip discontinued on 4/17 due to therapeutic INR.   INR remains supratherapeutic today at 4.3. No bleeding today per Rn. We will continue to hold coumadin. Last dose was on 4/19.   Hgb 10.5, plt wnl  Goal of Therapy:  INR 2.5-3.5  Plan:  No coumadin today Monitor daily INR and CBC F/u resolution of epistaxis Monitor for signs and symptoms of bleeding, drug-drug interactions  Ulyses Southward, PharmD, [place], AAHIVP, CPP Infectious Disease Pharmacist 04/01/2023 9:48 AM

## 2023-04-01 NOTE — Progress Notes (Addendum)
Physical Therapy Session Note  Patient Details  Name: Colleen [name] MRN: [id number] Date of Birth: Jan 19, 1961  Today's Date: 04/01/2023 PT Individual Time: [phone] and [id number] PT Individual Time Calculation (min): 55 min and 69 min  Short Term Goals: Week 1:  PT Short Term Goal 1 (Week 1): pt will transfer sit<>stand with LRAD and min A PT Short Term Goal 1 - Progress (Week 1): Progressing toward goal PT Short Term Goal 2 (Week 1): pt will transfer bed<>chair with LRAD and min A PT Short Term Goal 2 - Progress (Week 1): Met PT Short Term Goal 3 (Week 1): pt will ambulate 17ft with LRAD and min A PT Short Term Goal 3 - Progress (Week 1): Met Week 2:  PT Short Term Goal 1 (Week 2): STG=LTG due to LOS  Skilled Therapeutic Interventions/Progress Updates:  Treatment Session 1 Received pt sitting upright in bed and denied any pain during session. Session with emphasis on functional mobility/transfers, dressing, generalized strengthening and endurance, and gait training. Of note, pt more demanding, short, and resistant to therapist this morning, insisting on doing things her own way. Encouraged pt to wear ted hose due to increased swelling in BLE; pt initially refused but with encouragement and education on benefits pt finally agreed. Donned bilateral ted hose and non-skid socks with total A for edema management. Encouraged pt to try to sit up from flat bed to simulate bed at home, however pt refused and insisted on keeping HOB elevated. Pt transferred semi-reclined<>sitting EOB with HOB elevated and use of bedrails with supervision and increased time. Doffed gown and donned button up shirt with min A. Donned brief and scrub pants sitting EOB with max A and stood with RW and CGA from elevated EOB - cues for adherence to sternal precautions as pt pushing up with BUE on RW. Pt required total A to pull brief/pants over hips and transferred bed<>WC stand<>pivot with RW and CGA. Pt sat in WC at sink  and washed face/brushed teeth with set up assist. Stood from [facility] with RW and mod A x 2 trials and pt ambulated 87ft x 1 and 63ft x 1 with RW and CGA with WC follow. Pt prefers to place BUE support on RW when standing but reports she isn't "pushing" through it. Pt also does not like therapist to lift from gait belt, waist, or under buttocks when standing, reporting it "makes her poop", therefore assisted under armpit when rising to stand. Returned to room and concluded session with pt sitting in [facility] with all needs within reach and BLE elevated.   Treatment Session 2 Received pt sitting in WC, pt agreeable to PT treatment, and denied any pain during session. Session with emphasis on functional mobility/transfers, toileting, generalized strengthening and endurance, dynamic standing balance/coordination, and gait training. Pt reported urge to toilet - declined ambulating into bathroom due to urgency. Transferred to/from toilet with bedside commode over top using grab bar and min A and required max A for clothing management. Pt continent of small BM and stood from commode with CGA and dependent for pericare. Pt transported to/from room in [facility] dependently for time management purposes.   Pt performed seated BLE strengthening on Kinetron at 20 cm/sec for 1 minute x 4 trials with emphasis on glute/quad strength. Stood from [facility] with RW and mod A x 2 trials (with therapist assisting underneath armpit and pt placing both UEs on RW, per preference). Pt ambulated 42ft with RW and CGA to fatigue, then requested to sit -  of note, pt does not respond well to continuous encouragement and gets more easily frustrated. Returned to room and pt requested to return to bed and get changed and ready for dialysis. Pt stood from [facility] with RW and mod A and transferred WC<>bed stand<>pivot with RW and CGA. Doffed brief/pants with max A and button up shirt without assist. Donned gown with min A and transferred sit<>supine with max A for BLE  management. Scooted to [facility] with max A and pt pushing with BLE in hooklying position. Concluded session with pt semi-reclined in bed, needs within reach, and bed alarm on. BLEs elevated and provided pt with warm blanket.   Therapy Documentation Precautions:  Precautions Precautions: Sternal, Fall, ICD/Pacemaker Precaution Comments: Anxious. Monitor (O2 > 90%), and HR Restrictions Weight Bearing Restrictions: No Other Position/Activity Restrictions: sternal precautions   Therapy/Group: Individual Therapy Martin Majestic PT, DPT  04/01/2023, 7:03 AM

## 2023-04-01 NOTE — Patient Care Conference (Signed)
Inpatient RehabilitationTeam Conference and Plan of Care Update Date: 04/01/2023   Time: 11:47 AM    Patient Name: Colleen [name]      Medical Record Number: [id number]  Date of Birth: May 07, 1961 Sex: Female         Room/Bed: 4W11C/4W11C-01 Payor Info: Payor: MEDICARE / Plan: MEDICARE PART A AND B / Product Type: *No Product type* /    Admit Date/Time:  03/24/2023  2:00 PM  Primary Diagnosis:  Debility  Hospital Problems: Principal Problem:   Debility Active Problems:   ESRD on dialysis   Pressure injury of skin   S/P MVR (mitral valve repair)    Expected Discharge Date: Expected Discharge Date: 04/11/23  Team Members Present: Physician leading conference: Dr. Sula Soda Social Worker Present: Lavera Guise, BSW Nurse Present: Chana Bode, RN PT Present: Raechel Chute, PT OT Present: Candee Furbish, OT PPS Coordinator present : Fae Pippin, SLP     Current Status/Progress Goal Weekly Team Focus  Bowel/Bladder   Continent bowel but anuria(HD Pt)  LBM  03/31/23   Will maintain normal B/B pattern   Assit with toileting qshift/prn    Swallow/Nutrition/ Hydration               ADL's   Set up A UB bathing, Min A UB dressing, Mod A LB, min/mod A toileting   Min A; plan to upgrade to more supervision if keeping current DC date   ADL retraining, balance, endurance, O2 weaning, DC planning    Mobility   bed mobility min/mod A, transfers with RW min/mod A, gait 87ft with RW and min A   supervision/mod I  functional mobility/transfers, generalized strengthening and endurance, dynamic standing balance/coordination, gait training, DME, D/C planning, and caregiver training    Communication                Safety/Cognition/ Behavioral Observations               Pain   Denies pain on this shift   Will be free from pain   Assess pt for pain qshift/prn    Skin   Stage ii to sacrum(foam in place)   Will maintain skin intergrity with no breakdown   Assess skin for breakdown qshift/prn      Discharge Planning:  Discharging home with assistance from sister's boyfriend and friends. Level entry apartment.   Team Discussion: Patient with debility post cardiac surgery; continue sternal precautions. Healing stage 2 on left buttock. Epistaxis improved and DM medications adjusted per MD.  Progress limited by eccentricities and behavior; does not respond well to encouragement and is not receptive to recommendations for modifications/adaptations to improve performance of activities.  Patient on target to meet rehab goals: yes, currently needs set up for upper body care and min assist for dressing. Needs mod assist for lower body care and min - mod for toileting. Completes   *See Care Plan and progress notes for long and short-term goals.   Revisions to Treatment Plan:  N/a   Teaching Needs: Safety, medications, sternal precautions, dietary modifications, skin care, etc.  Current Barriers to Discharge: Decreased caregiver support, Home enviroment access/layout, and Weight bearing restrictions  Possible Resolutions to Barriers: Discharge education with friends HH follow up services DME: TTB, Rolling walker, Wheelchair, 3N1/BSC     Medical Summary Current Status: loose stools, sutures to mid abdomen, tachycardia, overweight, no more epstaxis, elevated INR, CBGs reviewed and improved    Barriers to Discharge Comments: loose stools, sutures to mid  abdomen, tachycardia, overweight, elevated INR, type 2 diabetes on insulin Possible Resolutions to [employer] Focus: continue loperamide prn, consulted surgery regarding suture removal, continue to monitor HR TID, coumadin dosing as per pharmacy with daily lab checks, d/c ISS   Continued Need for Acute Rehabilitation Level of Care: The patient requires daily medical management by a physician with specialized training in physical medicine and rehabilitation for the following reasons: Direction  of a multidisciplinary physical rehabilitation program to maximize functional independence : Yes Medical management of patient stability for increased activity during participation in an intensive rehabilitation regime.: Yes Analysis of laboratory values and/or radiology reports with any subsequent need for medication adjustment and/or medical intervention. : Yes   I attest that I was present, lead the team conference, and concur with the assessment and plan of the team.   Chana Bode B 04/01/2023, 1:53 PM

## 2023-04-01 NOTE — Progress Notes (Signed)
Occupational Therapy Session Note  Patient Details  Name: Colleen [name] MRN: [id number] Date of Birth: [birth year]  Today's Date: 04/01/2023 OT Individual Time: 1324-4010 OT Individual Time Calculation (min): 55 min    Short Term Goals: Week 1:  OT Short Term Goal 1 (Week 1): Pt will complete 1/3 toileting steps with CGA OT Short Term Goal 2 (Week 1): Pt will complete sit > stand in prep for ADL with max A using LRAD OT Short Term Goal 3 (Week 1): Pt will complete transfer with mod A of 1 using LRAD to promote OOB toileting  Skilled Therapeutic Interventions/Progress Updates:  Skilled OT intervention completed with focus on DC planning, sit > stands, BUE endurance. Pt received seated in w/c, agreeable to session. No pain reported. Received on RA, and remained for entirety of session with SPO2 WNL.   Transported dependently in w/c <> ADL bathroom for 2nd attempt at tub/shower transfer due to time constraint yesterday. Pt remained very particular and short tempered this session with frequent complaints about OT assist methods despite making readjustments to hand placement and/or suggestions to make mobility more efficient comfortable.  OT lowered tub bench per pt request, though education provided about not lowering the DME too low so that she can stand to get off of it as low surfaces greatly challenge pt. Attempted to stand with mod A using RW with both hands on RW vs continued educated method of 1 hand on w/c for sternal precaution adherence. Pt only able to come halfway upright before plopping self back down into w/c, with pt exclaiming "don't pull on me!" Even with OT assisting with pt's preferred method of under arm vs on pants. Pt landed too far at the edge of the w/c, requiring safety cues for the need to scoot back. Pt was resistant to OT assisting, however poor ability/efficiency to scoot hips back despite max cues. Eventually pt agreeable with mod A with cues to load her feet for  scooting back. Re-attempted stand however pt with poor clearance again, with pt blaming OT for not "doing it right" however education offered that her BLE may be fatigued from ambulation trial in earlier PT session vs therapist skill set.   Deferred further standing to allow BLE rest. Back in room, pt completed the following BUE exercises to address strength needed for sit > stands and functional transfers: (With yellow theraband anchored on w/c) 2x10 reps Self-anchored bicep flexion each arm Self-anchored tricep extension each arm Light unilateral chest presses with no strain on chest reported Shoulder external rotation -Mod cues needed for form  Pt remained seated in w/c, with belt alarm on/activated, and with all needs in reach at end of session.   Therapy Documentation Precautions:  Precautions Precautions: Sternal, Fall, ICD/Pacemaker Precaution Comments: Anxious. Monitor (O2 > 90%), and HR Restrictions Weight Bearing Restrictions: No Other Position/Activity Restrictions: sternal precautions    Therapy/Group: Individual Therapy  Melvyn Novas, MS, OTR/L  04/01/2023, 12:16 PM

## 2023-04-02 DIAGNOSIS — R5381 Other malaise: Secondary | ICD-10-CM | POA: Diagnosis not present

## 2023-04-02 LAB — GLUCOSE, CAPILLARY
Glucose-Capillary: 138 mg/dL — ABNORMAL HIGH (ref 70–99)
Glucose-Capillary: 258 mg/dL — ABNORMAL HIGH (ref 70–99)
Glucose-Capillary: 236 mg/dL — ABNORMAL HIGH (ref 70–99)
Glucose-Capillary: 173 mg/dL — ABNORMAL HIGH (ref 70–99)

## 2023-04-02 LAB — PROTIME-INR
INR: 3.5 — ABNORMAL HIGH (ref 0.8–1.2)
Prothrombin Time: 35.1 seconds — ABNORMAL HIGH (ref 11.4–15.2)

## 2023-04-02 MED ORDER — CHLORHEXIDINE GLUCONATE CLOTH 2 % EX PADS: 6.0000 | MEDICATED_PAD | Freq: Every day | CUTANEOUS | Status: DC

## 2023-04-02 MED ORDER — INSULIN ASPART 100 UNIT/ML IJ SOLN
5.0000 [IU] | Freq: Every day | INTRAMUSCULAR | Status: DC
  Administered 2023-04-03: 2 [IU] via SUBCUTANEOUS

## 2023-04-02 MED ORDER — WARFARIN SODIUM 1 MG PO TABS
1.0000 mg | ORAL_TABLET | Freq: Once | ORAL | Status: AC
  Administered 2023-04-02: 1 mg via ORAL
  Filled 2023-04-02: qty 1

## 2023-04-02 NOTE — Progress Notes (Signed)
PROGRESS NOTE   Subjective/Complaints: No new complaints this morning Discussed decrease in Novolog to 5U after breakfast given excellent CBGs Discussed excellent progress with therapies  ROS: Patient denies fever, rash, sore throat, blurred vision, dizziness, nausea, vomiting, diarrhea, cough, shortness of breath or chest pain, joint or back/neck pain, headache, epistaxis resolved   Objective:   No results found. Recent Labs    04/01/23 1400  WBC 9.7  HGB 10.6*  HCT 34.2*  PLT 273   Recent Labs    04/01/23 1400  NA 135  K 3.0*  CL 94*  CO2 27  GLUCOSE 83  BUN 12  CREATININE 2.55*  CALCIUM 7.7*    Intake/Output Summary (Last 24 hours) at 04/02/2023 0954 Last data filed at 04/01/2023 1719 Gross per 24 hour  Intake 0 ml  Output 3500 ml  Net -3500 ml     Pressure Injury 03/24/23 Buttocks Left Stage 2 -  Partial thickness loss of dermis presenting as a shallow open injury with a red, pink wound bed without slough. (Active)  03/24/23 1500  Location: Buttocks  Location Orientation: Left  Staging: Stage 2 -  Partial thickness loss of dermis presenting as a shallow open injury with a red, pink wound bed without slough.  Wound Description (Comments):   Present on Admission: Yes    Physical Exam: Vital Signs Blood pressure (!) 115/57, pulse 98, temperature 97.8 F (36.6 C), temperature source Oral, resp. rate 16, height 5' 3.86" (1.622 m), weight 70 kg, last menstrual period 12/08/2013, SpO2 97 %. Constitutional: No distress . Vital signs reviewed. BMI 27.90 HEENT: NCAT, EOMI, oral membranes moist, no more epistaxis, no longer requiring nasal cannula Neck: supple Cardiovascular: Tachycardic without murmur. No JVD    Respiratory/Chest: CTA Bilaterally without wheezes or rales. Normal effort    GI/Abdomen: BS +, non-tender, non-distended Ext: no clubbing, cyanosis, or edema Psych: flat to anxious   Musculoskeletal:     Cervical back: Neck supple. No tenderness.     Comments: Ue's 5-/5 B/L HF/KE 3+/5 B/L; DF/PF 4+/5 B/L  Skin:    General: chest wall incisions CDI    Comments: 2-3+ edema BLE- and some wooding in hands and arms- from reduced swelling--stable to improved L buttock pressure ulcer- Stage II- ~1.5 x 0.5 cm- pink- slightly open Incision closed on R chest and L chest from prior HD catheters Fistula L upper arm- (+) thrill  +LE edema Neurological:     Mental Status: She is alert.     Comments: Right facial weakness without dysarthria. Speech slow but clear. Oriented X 4. Intact to light touch in all 4 extremities per pt  Fully oriented       Assessment/Plan: 1. Functional deficits which require 3+ hours per day of interdisciplinary therapy in a comprehensive inpatient rehab setting. Physiatrist is providing close team supervision and 24 hour management of active medical problems listed below. Physiatrist and rehab team continue to assess barriers to discharge/monitor patient progress toward functional and medical goals  Care Tool:  Bathing    Body parts bathed by patient: Right arm, Left arm, Chest, Abdomen, Face, Right upper leg, Left upper leg   Body parts bathed by  helper: Front perineal area, Buttocks, Right lower leg, Left lower leg     Bathing assist Assist Level: Supervision/Verbal cueing     Upper Body Dressing/Undressing Upper body dressing   What is the patient wearing?: Button up shirt    Upper body assist Assist Level: Minimal Assistance - Patient > 75%    Lower Body Dressing/Undressing Lower body dressing      What is the patient wearing?: Incontinence brief, Pants     Lower body assist Assist for lower body dressing: Moderate Assistance - Patient 50 - 74%     Toileting Toileting    Toileting assist Assist for toileting: Moderate Assistance - Patient 50 - 74%     Transfers Chair/bed transfer  Transfers assist  Chair/bed transfer  activity did not occur: Safety/medical concerns (weakness, fear)  Chair/bed transfer assist level: Contact Guard/Touching assist     Locomotion Ambulation   Ambulation assist   Ambulation activity did not occur: Safety/medical concerns (fatigue, fear, generalized weakness)  Assist level: Contact Guard/Touching assist Assistive device: Walker-rolling Max distance: 22ft   Walk 10 feet activity   Assist  Walk 10 feet activity did not occur: Safety/medical concerns (fatigue, fear, generalized weakness)  Assist level: Contact Guard/Touching assist Assistive device: Walker-rolling   Walk 50 feet activity   Assist Walk 50 feet with 2 turns activity did not occur: Safety/medical concerns (fatigue, fear, generalized weakness)  Assist level: Minimal Assistance - Patient > 75% Assistive device: Walker-rolling    Walk 150 feet activity   Assist Walk 150 feet activity did not occur: Safety/medical concerns (fatigue, fear, generalized weakness)         Walk 10 feet on uneven surface  activity   Assist Walk 10 feet on uneven surfaces activity did not occur: Safety/medical concerns (fatigue, fear, generalized weakness)         Wheelchair     Assist Is the patient using a wheelchair?: Yes Type of Wheelchair: Manual Wheelchair activity did not occur: Safety/medical concerns (fatigue, fear, generalized weakness)         Wheelchair 50 feet with 2 turns activity    Assist    Wheelchair 50 feet with 2 turns activity did not occur: Safety/medical concerns (fatigue, fear, generalized weakness)       Wheelchair 150 feet activity     Assist  Wheelchair 150 feet activity did not occur: Safety/medical concerns (fatigue, fear, generalized weakness)       Blood pressure (!) 115/57, pulse 98, temperature 97.8 F (36.6 C), temperature source Oral, resp. rate 16, height 5' 3.86" (1.622 m), weight 70 kg, last menstrual period 12/08/2013, SpO2 97 %.     Medical Problem List and Plan: 1. Functional deficits secondary to debility after MV repair with sternal precautions             -patient may  shower if cover incisions- HD catheters in chest are out- also has sternal incisions that need to be covered             -ELOS/Goals: 10-14 days supervision to min A  Continue CIR therapies including PT, OT  2.  Mechanical MVR: INR therapeutic and heparin drip d/ced.  Pharmaceutical: Coumadin  --INR supra-therapeutic this am             -antiplatelet therapy: ASA 3. Pain Management:  Tylenol prn. Lidocaine patches prn. Denies pain 4. Insomnia: increase melatonin to  HS 4/20  -Charge nurse spoke with her about PM nursing care.  5. Anxiety: neuropsych consulted 6.  Skin/Wound Care: Routine pressure relief measures with frequent pressure relief measures for sacral decub.             --Interdry to skin folds.              --Add vitamin C and Zinc to promote wound healing 7. Fluids/Electrolytes/Nutrition: Strict I/O. Labs with HD.  --Will keep diet as regular to help with food choices             --Juven and nephro added. 8. MVR/TVR by Dr. Felipa Furnace [phone]): Continue Sternal precautions.              --PPM for CHB             --coumadin with INR goal 2.5-3.5 9.  Hypotension: Monitor for symptoms with increase in activity             --on midodrine, Staterra and Droxidopa. Blood pressures reviewed and have improved 10.  ESRD: Schedule HD at the end of the day. Renal diet with 1200 cc FR.  11. T2DM: Provided list of foods that are good for diabetes. D/c HS SSI. D/c daytime ISS, decrease breakfast insulin to 5U 12. A fib: Monitor HR TID. No BB due to hypotension. HR continues to be elevated, continue to monitor             --on coumadin. 13. Anemia of chronic disease: Monitor H/H with HD labs/             --transfuse prn Hgb <7.0 or if symptomatic.  14. Anxiety- pt cannot tolerate door/curtain closed 15. New O2 requirement with mild  tachypnea/volume overload -  -volume mgt per nephrology with HD  -continue oxygen as needed--humidfy 16. Overweight: provide dietary education 17. Vitamin D insufficiency: continue ergocalciferol 50,000U once per week for 7 weeks 18. Shortness of breath: improved, reviewed CXR results with her that show pleural effusion and atelectasis, incentive spirometer ordered, educated regarding its benefits 19. Epistaxis left nostril--likely exacerbated by O2 Bruning and elevated INR  -Hgb reviewed and trending upward  -1mg  coumadin today  -will schedule afrin  -resolved  -humidified O2  -ocean nasal spray as possible 20. Tachycardia: continue to monitor HR TID 21. Overweight: provide dietary education    LOS: 9 days A FACE TO FACE EVALUATION WAS PERFORMED  Drema Pry Colleen [name] 04/02/2023, 9:54 AM

## 2023-04-02 NOTE — Progress Notes (Signed)
Occupational Therapy Session Note  Patient Details  Name: Colleen [name] MRN: [id number] Date of Birth: Apr 30, 1961  Today's Date: 04/02/2023 OT Individual Time: 1020-1100 & 0865-7846 OT Individual Time Calculation (min): 40 min & 38 min OT missed time: 7 min Missed time reason: fatigue   Short Term Goals: Week 2:  OT Short Term Goal 1 (Week 2): STG = LTG due to ELOS  Skilled Therapeutic Interventions/Progress Updates:  Session 1 Skilled OT intervention completed with focus on functional transfers, BLE edema management and BLE/cardiovascular endurance. Pt received seated in w/c, agreeable to session. No pain reported.  Pt was much more pleasant and cooperative this session, however was very clear about disliking encouragement or motivation phrases from staff therefore OT refrained from compliments or verbal statements about her progress but then pt frequently asked therapist "how am I doing, am I doing good?"    Transported dependently in w/c <> gym for time and adherence to sternal precautions. Remained on RA for entire session, mild SOB with exertion but SPO2 WNL.  Completed min A sit > stand using RW with therapist arm under pt's R arm for assist vs pulling on pants for pt preferred method. CGA short ambulatory transfer to nustep using RW. Min A needed for managing BLE onto foot plates. Pt was unable to tolerate resistance level higher than 1, therefore focus on BLE ROM for edema vs speed or endurance. Did not use arms on nustep for sternal adherence. Poor endurance noted, as pt able/only willing to complete "20 reps, then take a break." Did 10 x 20 with significant breaks needed in between for fatigue, and a "music break" with gospel music played per pt preference. Min A sit > stand using RW then short ambulatory transfer to w/c.  Back in room, pt remained seated in w/c with BLE elevated, with belt alarm on/activated, and with all needs in reach at end of session.  Session 2 Skilled  OT intervention completed with focus on functional transfers, bed mobility and toileting education. Pt received seated in w/c, agreeable to session. Unrated pain reported in R knee; pt declined pain meds. OT offered repositioning for pain reduction.  Pt requested to return to bed due to fatigue after sitting up all day. Pt declined need for toileting. Min A sit > stand using RW, then ambulated about 8 ft to EOB using RW with CGA. OT advised that pt stay standing to doff pants prior to sitting but pt sat prior to removal and when encouraged to stand again for doffing of pants, pt bluntly refused. Doffed TEDs and socks with total A. Doffed button down shirt with mod cues but only supervision physical A. Donned gown with set up A.  Education provided on EOB > L side-lying > supine bed mobility for increased independence and sternal precaution adherence. Tactile cues needed for lowering to L elbow, mod cues for during transition but only mod assist need for BLE management. Of note- pt tolerated about 3 mins with HOB elevated only about 20 degrees as in the past pt unable to tolerate anything but 90 degrees but therapist didn't draw attention to it. Educated/instructed pt in bridging technique for doffing of pants with CGA provided for stabilization of the feet but pt able to doff pants with only min A.   Pt expected OT to doff brief as well and when questioned why she stated "because that way I can poop on the bed pad." OT advised that pt can transfer with nursing to [facility]  for cleaner, more functional toileting however pt had increased temper when this was suggested. Discussed if incontinence is a factor, she could try timed toileting for BM but is not as effective as for someone who voids (she doesn't with HD). Bridged again with total A for doffing of brief. Pt requested to spend rest of session resting prior to dialysis therefore missed 7 mins of OT intervention due to fatigue; will make up time as able.  Pt  remained upright in bed with sister present, with bed alarm on/activated, and with all needs in reach at end of session.   Therapy Documentation Precautions:  Precautions Precautions: Sternal, Fall, ICD/Pacemaker Precaution Comments: Anxious. Monitor (O2 > 90%), and HR Restrictions Weight Bearing Restrictions: No Other Position/Activity Restrictions: sternal precautions    Therapy/Group: Individual Therapy  Melvyn Novas, MS, OTR/L  04/02/2023, 3:37 PM

## 2023-04-02 NOTE — Progress Notes (Signed)
[facility] Progress Note   Subjective:    HD yest -  removed 3500-  felt OK and for the most part  BP has held-  had agreed to an extra treatment for volume -  will try to get it done-  I dont think labs yest are correct -  drawn before HD K and phos low  Objective Vitals:   04/01/23 1820 04/01/23 2005 04/02/23 0440 04/02/23 0500  BP: (!) 96/56 94/63 (!) 115/57   Pulse: 90 89 98   Resp: Temp: 97.8 F (36.6 C) 97.9 F (36.6 C) 97.8 F (36.6 C)   TempSrc: Oral Oral Oral   SpO2: 93% 93% 97%   Weight:    70 kg  Height:       Physical Exam General: Well appearing woman, NAD  facial asymetry Heart:RRR; no murmur; healing mid-line chest incision Lungs:Fine bibasilar rales present (sounds slightly improved from yesterday), clear in upper lobes Abdomen: soft Extremities: 2+ BLE edema and 2+ RUE edema Dialysis Access: LUE AVF + bruit  Filed Weights   04/01/23 0500 04/01/23 1713 04/02/23 0500  Weight: 73.4 kg 69.8 kg 70 kg    Intake/Output Summary (Last 24 hours) at 04/02/2023 1026 Last data filed at 04/01/2023 1719 Gross per 24 hour  Intake 0 ml  Output 3500 ml  Net -3500 ml    Additional Objective Labs: Basic Metabolic Panel: Recent Labs  Lab 03/29/23 0614 03/30/23 0745 04/01/23 1400  NA 129* 130* 135  K 3.7 4.4 3.0*  CL 92* 91* 94*  CO2 23 20* 27  GLUCOSE 214* 163* 83  BUN 31* 41* 12  CREATININE 5.62* 6.72* 2.55*  CALCIUM 7.6* 8.0* 7.7*  PHOS 4.6 5.4* 1.6*   Liver Function Tests: Recent Labs  Lab 03/29/23 0614 03/30/23 0745 04/01/23 1400  ALBUMIN 2.3* 2.4* 2.5*   No results for input(s): "LIPASE", "AMYLASE" in the last 168 hours. CBC: Recent Labs  Lab 03/27/23 0525 03/28/23 0610 03/29/23 0614 03/30/23 0745 04/01/23 1400  WBC 7.7 7.6 7.9 8.3 9.7  NEUTROABS 5.8  --   --   --   --   HGB 9.5* 10.2* 9.7* 10.5* 10.6*  HCT 31.3* 32.5* 31.9* 34.1* 34.2*  MCV 94.3 91.8 93.5 92.7 90.2  PLT 184 179 192 246 273   Blood  Culture    Component Value Date/Time   SDES BLOOD RIGHT HAND 12/11/2020 1151   SPECREQUEST  12/11/2020 1151    BOTTLES DRAWN AEROBIC AND ANAEROBIC Blood Culture adequate volume   CULT  12/11/2020 1151    NO GROWTH 5 DAYS Performed at [facility], [address] [place], [place] [address]    REPTSTATUS 12/16/2020 FINAL 12/11/2020 1151    Cardiac Enzymes: No results for input(s): "CKTOTAL", "CKMB", "CKMBINDEX", "TROPONINI" in the last 168 hours. CBG: Recent Labs  Lab 04/01/23 0609 04/01/23 1136 04/01/23 1816 04/01/23 2054 04/02/23 0603  GLUCAP 99 90 77 83 138*   Iron Studies: No results for input(s): "IRON", "TIBC", "TRANSFERRIN", "FERRITIN" in the last 72 hours. Lab Results  Component Value Date   INR 4.3 (HH) 04/01/2023   INR 4.3 (HH) 03/31/2023   INR 3.9 (H) 03/30/2023   Studies/Results: No results found.  Medications:   amiodarone  200 mg Oral Daily   aspirin EC  81 mg Oral Daily   atomoxetine  18 mg Oral Daily   Chlorhexidine Gluconate Cloth  6 each Topical Q0600   cinacalcet  60 mg Oral  Q M,W,F   darbepoetin (ARANESP) injection - DIALYSIS  60 mcg Subcutaneous Q Fri-1800   doxercalciferol  3 mcg Intravenous Q M,W,F-HD   droxidopa  500 mg Oral TID WC   famotidine  10 mg Oral BID   feeding supplement (NEPRO CARB STEADY)  237 mL Oral TID WC   insulin aspart  5 Units Subcutaneous BID AC   insulin aspart  5 Units Subcutaneous QPC breakfast   melatonin  10 mg Oral QHS   midodrine  20 mg Oral TID WC   multivitamin  1 tablet Oral QHS   nutrition supplement (JUVEN)  1 packet Oral BID BM   polycarbophil  625 mg Oral Daily   simethicone  80 mg Oral QID   sodium chloride  1 spray Each Nare TID PC   Vitamin D (Ergocalciferol)  50,000 Units Oral Q7 days   Warfarin - Pharmacist Dosing Inpatient   Does not apply q1600    Dialysis Orders: MWF SW 3h  400/1.5  69.5kg 2K/2Ca bath  Heparin 3000  AVF LUA - last OP HD 3/13, post wt 69.5kg - sensipar  po tiw -  hectorol 3 mcg IV tiw - venofer 50 mg IV weekly   61yo F s/p [facility] admit 3/13-4/16/24 for MV replacement, TV ring, AV decalcification with removal of LV mass. Post-op, she have A-fib and CHB, underwent leadless PPM on 02/26/23. Hospitalization c/b hypotension, on midodrine + Strattera + Droxidopa. Will be on warfarin + ASA for coagulation. Transferred to CIR for rehab.   Assessment/Plan: 1. Valvular heart disease s/p MVR, TV ring, AoV decalcification with removal LV mass 02/18/23: On warfarin for anticoagulation. 2. ESRD: Will continue outpatient HD schedule -  She refuses to run > 3hr treatment, had been refusing extra HD for volume removal- but now is agreeable-  2 hour uf to pull fluid today /Thursday-  she is concerned that her BP is so low-  I told her we just need to navigate this new normal for her.   3. HYPOTN/volume: On midodrine  TID, droxidopa  TID, and atomoxetine  daily for hypotension. She is very edematous - will see what we can get off with dialysis, hopefully start slowly getting her weight down-  making progress- low temp and albumin. 4. Anemia of ESRD: Hgb 9.7 -  last Aranesp on 4/19. Monitor trend, will raise if no improvement. Is now above 10 5. Secondary hyperparathyroidism:  Corr/Phos at goal, on cinacalcet  TIW and hectorol. Hold binders for now since phos is reasonable -  think labs drawn yest pre HD are diluted or drawn later in HD  6. Nutrition:  On protein supplements 7. T2DM: On insulin, per primary. 8. A-fib: On amiodarone. 9. Debility: In CIR - follow progress.  [name]  [facility] 04/02/2023,10:26 AM  LOS: 9 days

## 2023-04-02 NOTE — Progress Notes (Signed)
ANTICOAGULATION CONSULT NOTE   Pharmacy Consult for Warfarin , INR goal 2.5-3.5  Indication: Mechanical MVR  Allergies  Allergen Reactions   Farxiga [Dapagliflozin] Other (See Comments)    Cannot take due to kidneys   Hydrocodone Other (See Comments)    "Increase Glucose level," per pt   Kombiglyze Xr [Saxagliptin-Metformin Er] Other (See Comments)    Cannot take due to kidneys    Patient Measurements: Height: 5' 3.86" (162.2 cm) (from [facility] records on 02/18/23) Weight: 70 kg (154 lb 5.2 oz) IBW/kg (Calculated) : 54.37 Heparin Dosing Weight: 70.1 kg   Vital Signs: Temp: 97.8 F (36.6 C) (04/25 0440) Temp Source: Oral (04/25 0440) BP: 115/57 (04/25 0440) Pulse Rate: 98 (04/25 0440)  Labs: Recent Labs    03/31/23 0919 04/01/23 0601 04/01/23 1400 04/02/23 0936  HGB  --   --  10.6*  --   HCT  --   --  34.2*  --   PLT  --   --  273  --   LABPROT 40.9* 41.2*  --  35.1*  INR 4.3* 4.3*  --  3.5*  CREATININE  --   --  2.55*  --      Estimated Creatinine Clearance: 22.4 mL/min (A) (by C-G formula based on SCr of 2.55 mg/dL (H)).   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 (3/14-4/16/24) s/pMVR with valvuloplasty TV with ring insertion, AV decalcification with removal of LV mass 02/19/23. Then developed CHB and afib post-op and on 3/21 underwent leadless PPM placement.   Pharmacy consult for IV heparin and Warfarin , goal  INR 2.5-3.5 for mechantcal MVR.   She also has a history of DVT/PE. 2016, 2019)  Patient reports she was not taking any anticoagulation prior to the [facility] admission, and it has been years since she has taken Eliquis.   At [facility] she was started on Warfarin .  She received Heparin IV 1300 units/hr 4/13-4/15 then hehparin drip was discontinued on  4/15 (PTT was 51.3 sec on 4/15) , when INR 1.8.  Today the INR = 2.3 (4/16 AM) at [facility].  Goal INR 2.5-3.5 INR drawn  admit 4/16 = 2.2 She received  Warfarin   , , , , 3.5mg  ,3.5mg   from 4/10 -03/23/23 at [facility],  Per [facility] records , Warfarin started ~02/21/23  s/p mechanical MVR 02/19/23.    Amiodarone has potential drug-drug interaction(DDI) w/Warfarin,  to increase hypoprothrombinemic effect of Warfarin. Monitoring this by protime Danae [facility] labs.  Per [facility]  records Amiodarone  started  ~3/21 w/load, Amio  QD since 4/10 and Warfarin started ~02/21/23 at [facility]), thus not a new /additional med.   [facility]  records reviewed, found on warfarin 2.5mg  daily as of 02/21/23 and most recent doses given at [facility]  prior to [facility] admit (  4/16) were  , , , , 3.5mg  ,3.5mg   from 4/10 -03/23/23.    Most recent INR trend at [facility]  4/14-4/16/24: INR 1.7-1.8-2.3 / repeat 2.2.  INR  2.4/ repeat 2.5 on 4/17 and today 4/18 INR is 2.9, therapeutic . Goal INR = 2.5-3.5.    Heparin drip discontinued on 4/17 due to therapeutic INR.   INR drops down to 3.5 today so we will resume her coumadin. We will resume at low dose.    Hgb 10.6, plt wnl  Goal of Therapy:  INR 2.5-3.5  Plan:  Coumadin  PO x1 Monitor daily INR and CBC F/u resolution of epistaxis Monitor for signs and symptoms of bleeding, drug-drug interactions  Ulyses Southward, PharmD, [place], AAHIVP, CPP Infectious Disease Pharmacist 04/02/2023 12:44 PM

## 2023-04-02 NOTE — Progress Notes (Signed)
Patient ID: Colleen [name], female   DOB: 08-Aug-1961, 62 y.o.   MRN: [id number]  Team Conference Report to Patient/Family  Team Conference discussion was reviewed with the patient and caregiver, including goals, any changes in plan of care and target discharge date.  Patient and caregiver express understanding and are in agreement.  The patient has a target discharge date of 04/11/23.  SW met with patient and called mother, via telephone to discuss team conference updates. Progress and recommendation discussed. Mother has a brand new rolling walker for patient. Sw ill order the remaining DME. Patient and mother agreeable to [facility] in the home. No additional questions or concerns.  Andria Rhein 04/02/2023, 9:59 AM

## 2023-04-02 NOTE — Progress Notes (Signed)
Patient ID: Colleen [name], female   DOB: 1961-01-05, 62 y.o.   MRN: [id number]  [facility] referral sen to [facility]

## 2023-04-02 NOTE — Progress Notes (Signed)
Patient ID: Colleen [name], female   DOB: 07-07-1961, 62 y.o.   MRN: [id number]  Wheelchair, RW, BSC, TTB

## 2023-04-02 NOTE — Progress Notes (Signed)
Physical Therapy Session Note  Patient Details  Name: Colleen [name] MRN: [id number] Date of Birth: 11-01-1961  Today's Date: 04/02/2023 PT Individual Time: [phone] and 1346-1410 PT Individual Time Calculation (min): 68 min and 24 min  Short Term Goals: Week 1:  PT Short Term Goal 1 (Week 1): pt will transfer sit<>stand with LRAD and min A PT Short Term Goal 1 - Progress (Week 1): Progressing toward goal PT Short Term Goal 2 (Week 1): pt will transfer bed<>chair with LRAD and min A PT Short Term Goal 2 - Progress (Week 1): Met PT Short Term Goal 3 (Week 1): pt will ambulate 30ft with LRAD and min A PT Short Term Goal 3 - Progress (Week 1): Met Week 2:  PT Short Term Goal 1 (Week 2): STG=LTG due to LOS  Skilled Therapeutic Interventions/Progress Updates:   Treatment Session 1 Received pt slouched down in bed and denied any pain during session. Pt in much better spirits today, laughing and joking with therapist. Session with emphasis on functional mobility/transfers, toileting, dressing, generalized strengthening and endurance, and gait training. Donned ted hose and non-skid socks in supine with total A for edema management. Pt transferred semi-reclined<>sitting EOB with HOB elevated and supervision (again requesting to keep HOB elevated). Doffed gown and donned button up shirt with supervision. Pt reported urge to toilet but declined ambulating to bathroom, wanting to "save energy". Transferred sit<>stand from elevated EOB with light min A and transferred bed<>WC stand<>pivot with RW and CGA. Pt transferred to/from toilet with bedside commode over top with supervision using grab bars - cues to avoid pulling on grab bar due to sternal precautions - ultimately unsuccessful on commode, then WOC arrived to perform skin assessment. Stood with RW and min A from [facility] while WOC inspected pt. Donned brief and pants seated with max A - stood again with RW and heavy min A and required assist to pull  brief/pants over hips. MD arrived for morning rounds and pt sat in [facility] at sink and washed face/brushed teeth with set up assist. Of note, pt continues to prefer to stand with BUE support on RW, but states she is not pushing through arms. Pt then received phone call from dialysis - plan to do 2hr session sometime today. Then laboratory arrived to draw blood. Pt transported to/from room in [facility] dependently for time management purposes. Stood from [facility] with RW and light mod A x 2 trials and ambulated 37ft x 2 trials with RW and CGA - limited by "tightness" in legs. PA then arrived briefly, then returned to room and concluded session with pt sitting in [facility] with all needs within reach. CSW present to discuss discharge planning.   Treatment Session 2 Received pt sitting in WC napping and denied any pain during session. Session with emphasis on functional mobility/transfers, generalized strengthening and endurance, and gait training. Pt stood from [facility] with RW and light mod A (placing BUE on RW for balance but not pushing through arms) and ambulated 50ft with RW and CGA. Pt politely declined any further ambulation due to fatigue but agreeable to seated exercises. Pt performed the following exercises with emphasis on LE strength/ROM: -hip abduction 2x12 with red TB -hip flexion 2x10 bilaterally with red TB -knee extension 2x10 bilaterally with red TB -hamstring curls 2x10 bilaterally with yellow TB Pt's sister arrived for brief part of session to observe. Concluded session with pt sitting in [facility] with all needs within reach awaiting upcoming OT session. BLEs elevated for edema management.  Therapy Documentation Precautions:  Precautions Precautions: Sternal, Fall, ICD/Pacemaker Precaution Comments: Anxious. Monitor (O2 > 90%), and HR Restrictions Weight Bearing Restrictions: No Other Position/Activity Restrictions: sternal precautions  Therapy/Group: Individual Therapy Martin Majestic PT, DPT   04/02/2023, 7:00 AM

## 2023-04-03 DIAGNOSIS — R5381 Other malaise: Secondary | ICD-10-CM | POA: Diagnosis not present

## 2023-04-03 LAB — RENAL FUNCTION PANEL
Albumin: 2.4 g/dL — ABNORMAL LOW (ref 3.5–5.0)
Anion gap: 19 — ABNORMAL HIGH (ref 5–15)
BUN: 36 mg/dL — ABNORMAL HIGH (ref 6–20)
CO2: 22 mmol/L (ref 22–32)
Calcium: 7.8 mg/dL — ABNORMAL LOW (ref 8.9–10.3)
Chloride: 91 mmol/L — ABNORMAL LOW (ref 98–111)
Creatinine, Ser: 6.86 mg/dL — ABNORMAL HIGH (ref 0.44–1.00)
GFR, Estimated: 6 mL/min — ABNORMAL LOW (ref >60)
Glucose, Bld: 216 mg/dL — ABNORMAL HIGH (ref 70–99)
Phosphorus: 5.2 mg/dL — ABNORMAL HIGH (ref 2.5–4.6)
Potassium: 4.3 mmol/L (ref 3.5–5.1)
Sodium: 132 mmol/L — ABNORMAL LOW (ref 135–145)

## 2023-04-03 LAB — GLUCOSE, CAPILLARY
Glucose-Capillary: 245 mg/dL — ABNORMAL HIGH (ref 70–99)
Glucose-Capillary: 230 mg/dL — ABNORMAL HIGH (ref 70–99)
Glucose-Capillary: 142 mg/dL — ABNORMAL HIGH (ref 70–99)
Glucose-Capillary: 162 mg/dL — ABNORMAL HIGH (ref 70–99)

## 2023-04-03 LAB — CBC
HCT: 32.6 % — ABNORMAL LOW (ref 36.0–46.0)
Hemoglobin: 10.4 g/dL — ABNORMAL LOW (ref 12.0–15.0)
MCH: 28.3 pg (ref 26.0–34.0)
MCHC: 31.9 g/dL (ref 30.0–36.0)
MCV: 88.8 fL (ref 80.0–100.0)
Platelets: 233 10*3/uL (ref 150–400)
RBC: 3.67 MIL/uL — ABNORMAL LOW (ref 3.87–5.11)
RDW: 18.6 % — ABNORMAL HIGH (ref 11.5–15.5)
WBC: 11.2 10*3/uL — ABNORMAL HIGH (ref 4.0–10.5)
nRBC: 2.9 % — ABNORMAL HIGH (ref 0.0–0.2)

## 2023-04-03 LAB — PROTIME-INR
INR: 3.5 — ABNORMAL HIGH (ref 0.8–1.2)
Prothrombin Time: 34.7 seconds — ABNORMAL HIGH (ref 11.4–15.2)

## 2023-04-03 MED ORDER — CHLORHEXIDINE GLUCONATE CLOTH 2 % EX PADS: 6.0000 | MEDICATED_PAD | Freq: Every day | CUTANEOUS | Status: DC

## 2023-04-03 MED ORDER — INSULIN GLARGINE-YFGN 100 UNIT/ML ~~LOC~~ SOLN
5.0000 [IU] | Freq: Every day | SUBCUTANEOUS | Status: DC
  Filled 2023-04-03: qty 0.05

## 2023-04-03 MED ORDER — ALTEPLASE 2 MG IJ SOLR: 2.0000 mg | Freq: Once | INTRAMUSCULAR | Status: DC | PRN

## 2023-04-03 MED ORDER — INSULIN GLARGINE-YFGN 100 UNIT/ML ~~LOC~~ SOLN: 5.0000 [IU] | Freq: Every day | SUBCUTANEOUS | Status: DC

## 2023-04-03 MED ORDER — WARFARIN SODIUM 1 MG PO TABS
1.0000 mg | ORAL_TABLET | Freq: Once | ORAL | Status: AC
  Administered 2023-04-03: 1 mg via ORAL
  Filled 2023-04-03: qty 1

## 2023-04-03 MED ORDER — PENTAFLUOROPROP-TETRAFLUOROETH EX AERO: 1.0000 | INHALATION_SPRAY | CUTANEOUS | Status: DC | PRN

## 2023-04-03 MED ORDER — MEDIHONEY WOUND/BURN DRESSING EX PSTE
1.0000 | PASTE | Freq: Every day | CUTANEOUS | Status: DC
  Administered 2023-04-03 – 2023-04-09 (×7): 1 via TOPICAL
  Filled 2023-04-03: qty 44

## 2023-04-03 MED ORDER — HEPARIN SODIUM (PORCINE) 1000 UNIT/ML IJ SOLN
INTRAMUSCULAR | Status: AC
  Filled 2023-04-03: qty 1

## 2023-04-03 MED ORDER — HEPARIN SODIUM (PORCINE) 1000 UNIT/ML DIALYSIS: 1000.0000 [IU] | INTRAMUSCULAR | Status: DC | PRN

## 2023-04-03 MED ORDER — INSULIN GLARGINE-YFGN 100 UNIT/ML ~~LOC~~ SOLN
6.0000 [IU] | Freq: Every day | SUBCUTANEOUS | Status: DC
  Administered 2023-04-03 – 2023-04-06 (×4): 6 [IU] via SUBCUTANEOUS
  Filled 2023-04-03 (×5): qty 0.06

## 2023-04-03 MED ORDER — LIDOCAINE-PRILOCAINE 2.5-2.5 % EX CREA: 1.0000 | TOPICAL_CREAM | CUTANEOUS | Status: DC | PRN

## 2023-04-03 MED ORDER — HEPARIN SODIUM (PORCINE) 1000 UNIT/ML DIALYSIS
20.0000 [IU]/kg | INTRAMUSCULAR | Status: DC | PRN
  Administered 2023-04-03: 1400 [IU] via INTRAVENOUS_CENTRAL

## 2023-04-03 MED ORDER — LIDOCAINE HCL (PF) 1 % IJ SOLN: 5.0000 mL | INTRAMUSCULAR | Status: DC | PRN

## 2023-04-03 MED ORDER — ANTICOAGULANT SODIUM CITRATE 4% (200MG/5ML) IV SOLN: 5.0000 mL | Status: DC | PRN

## 2023-04-03 NOTE — Progress Notes (Signed)
Received patient in bed to unit.  Alert and oriented.  Informed consent signed and in chart.   TX duration: 3  Patient tolerated well.  Transported back to the room  Alert, without acute distress.  Hand-off given to patient's nurse.   Access used: LEFT AVF Access issues: NONE  Total UF removed: 3.5 Medication(s) given: HECTOROL    04/03/23 1618  Vitals  Temp 97.6 F (36.4 C)  Temp Source Axillary  BP (!) 99/55  MAP (mmHg) 68  BP Location Right Arm  BP Method Automatic  Patient Position (if appropriate) Lying  Pulse Rate 90  Pulse Rate Source Monitor  ECG Heart Rate 90  Resp (!) 27  Oxygen Therapy  SpO2 99 %  O2 Device Room Air  During Treatment Monitoring  HD Safety Checks Performed Yes  Intra-Hemodialysis Comments Tx completed;Tolerated well  Dialysis Fluid Bolus Normal Saline  Bolus Amount (mL) 300 mL      Dreon Pineda [facility]

## 2023-04-03 NOTE — Progress Notes (Signed)
ANTICOAGULATION CONSULT NOTE   Pharmacy Consult for Warfarin , INR goal 2.5-3.5  Indication: Mechanical MVR  Allergies  Allergen Reactions   Farxiga [Dapagliflozin] Other (See Comments)    Cannot take due to kidneys   Hydrocodone Other (See Comments)    "Increase Glucose level," per pt   Kombiglyze Xr [Saxagliptin-Metformin Er] Other (See Comments)    Cannot take due to kidneys    Patient Measurements: Height: 5' 3.86" (162.2 cm) (from [facility] records on 02/18/23) Weight: 71 kg (156 lb 8.4 oz) IBW/kg (Calculated) : 54.37 Heparin Dosing Weight: 70.1 kg   Vital Signs: Temp: 97.9 F (36.6 C) (04/26 0700) Temp Source: Axillary (04/26 0700) BP: 111/58 (04/26 0700) Pulse Rate: 95 (04/26 0700)  Labs: Recent Labs    04/01/23 0601 04/01/23 1400 04/02/23 0936 04/03/23 0627  HGB  --  10.6*  --   --   HCT  --  34.2*  --   --   PLT  --  273  --   --   LABPROT 41.2*  --  35.1* 34.7*  INR 4.3*  --  3.5* 3.5*  CREATININE  --  2.55*  --   --      Estimated Creatinine Clearance: 22.6 mL/min (A) (by C-G formula based on SCr of 2.55 mg/dL (H)).   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 Developed CHB and Afib post-op, 3/21 underwent leadless PPM placement. Patient has a history of DVT/PE. 2016, 2019)   No longer on [facility] prior to [facility] admission. Has been off Eliquis for years.   [facility] started her on Warfarin ~ 3/16.  Of note: Amiodarone started ~3/21 w/load, 4/10 started Amio 200mg  QD.  Warfarin doses at [facility]: 2mg , 2mg , 3mg , 3mg , 3.5mg  ,3.5mg   from 4/10 -03/23/23    Pharmacy consult for IV heparin and Warfarin.  Heparin drip discontinued on 4/17 due to therapeutic INR.   Hgb 10.6, plt wnl. Patient has had some epistaxis that is currently noted as resolved. INR remains therapeutic today at 3.5 mg (doses held 4/20-24). PO intake looks to be extremely variable; between 0, 10, and 50% the last few days. Pt did not want breakfast this morning. Since patient is not eating regularly, will be conservative with dosing.   Goal of Therapy:  INR 2.5-3.5 (mechantcal MVR)  Plan:  Warfarin 1 mg PO x1 Monitor daily INR, CBC, clinical course, s/sx of bleed, PO intake/diet, Drug-Drug Interactions   Thank you for allowing [name] to participate in this patients care. Signe Colt, PharmD 04/03/2023 9:27 AM  **Pharmacist phone directory can be found on [email or web address] listed under [facility]**

## 2023-04-03 NOTE — Progress Notes (Signed)
Patient ID: Colleen [name], female   DOB: [birth year], 61 y.o.   MRN: [id number]  Sw informed mother will be present today for therapy session. Sw has verified mother is not present for sessions currently.

## 2023-04-03 NOTE — Progress Notes (Signed)
PROGRESS NOTE   Subjective/Complaints: Slept poorly last night due to interruptions. Placed nursing order to place sign on door to minimize interruptions at night Discussed that since CBGs have increased will add long acting insulin, stop the short acting after breakfast.   ROS: Patient denies fever, rash, sore throat, blurred vision, dizziness, nausea, vomiting, diarrhea, cough, shortness of breath or chest pain, joint or back/neck pain, headache, epistaxis resolved   Objective:   No results found. Recent Labs    04/01/23 1400  WBC 9.7  HGB 10.6*  HCT 34.2*  PLT 273   Recent Labs    04/01/23 1400  NA 135  K 3.0*  CL 94*  CO2 27  GLUCOSE 83  BUN 12  CREATININE 2.55*  CALCIUM 7.7*    Intake/Output Summary (Last 24 hours) at 04/03/2023 1052 Last data filed at 04/03/2023 0900 Gross per 24 hour  Intake 597 ml  Output --  Net 597 ml     Pressure Injury 03/24/23 Buttocks Left Stage 2 -  Partial thickness loss of dermis presenting as a shallow open injury with a red, pink wound bed without slough. (Active)  03/24/23 1500  Location: Buttocks  Location Orientation: Left  Staging: Stage 2 -  Partial thickness loss of dermis presenting as a shallow open injury with a red, pink wound bed without slough.  Wound Description (Comments):   Present on Admission: Yes    Physical Exam: Vital Signs Blood pressure (!) 111/58, pulse 95, temperature 97.9 F (36.6 C), temperature source Axillary, resp. rate 17, height 5' 3.86" (1.622 m), weight 71 kg, last menstrual period 12/08/2013, SpO2 92 %. Constitutional: No distress . Vital signs reviewed. BMI 26.99 HEENT: NCAT, EOMI, oral membranes moist, no more epistaxis, no longer requiring nasal cannula Neck: supple Cardiovascular: Tachycardic without murmur. No JVD    Respiratory/Chest: CTA Bilaterally without wheezes or rales. Normal effort    GI/Abdomen: BS +, non-tender,  non-distended Ext: no clubbing, cyanosis, or edema Psych: flat to anxious  Musculoskeletal:     Cervical back: Neck supple. No tenderness.     Comments: Ue's 5-/5 B/L HF/KE 3+/5 B/L; DF/PF 4+/5 B/L  Skin:    General: chest wall incisions CDI    Comments: 2-3+ edema BLE- and some wooding in hands and arms- from reduced swelling--stable to improved L buttock pressure ulcer- Stage II- ~1.5 x 0.5 cm- pink- slightly open Incision closed on R chest and L chest from prior HD catheters Fistula L upper arm- (+) thrill  +LE edema Neurological:     Mental Status: She is alert.     Comments: Right facial weakness without dysarthria. Speech slow but clear. Oriented X 4. Intact to light touch in all 4 extremities per pt  Fully oriented       Assessment/Plan: 1. Functional deficits which require 3+ hours per day of interdisciplinary therapy in a comprehensive inpatient rehab setting. Physiatrist is providing close team supervision and 24 hour management of active medical problems listed below. Physiatrist and rehab team continue to assess barriers to discharge/monitor patient progress toward functional and medical goals  Care Tool:  Bathing    Body parts bathed by patient: Right arm, Left  arm, Chest, Abdomen, Face, Right upper leg, Left upper leg   Body parts bathed by helper: Front perineal area, Buttocks, Right lower leg, Left lower leg     Bathing assist Assist Level: Supervision/Verbal cueing     Upper Body Dressing/Undressing Upper body dressing   What is the patient wearing?: Button up shirt    Upper body assist Assist Level: Minimal Assistance - Patient > 75%    Lower Body Dressing/Undressing Lower body dressing      What is the patient wearing?: Incontinence brief, Pants     Lower body assist Assist for lower body dressing: Moderate Assistance - Patient 50 - 74%     Toileting Toileting    Toileting assist Assist for toileting: Moderate Assistance - Patient 50 - 74%      Transfers Chair/bed transfer  Transfers assist  Chair/bed transfer activity did not occur: Safety/medical concerns (weakness, fear)  Chair/bed transfer assist level: Contact Guard/Touching assist     Locomotion Ambulation   Ambulation assist   Ambulation activity did not occur: Safety/medical concerns (fatigue, fear, generalized weakness)  Assist level: Contact Guard/Touching assist Assistive device: Walker-rolling Max distance: 66ft   Walk 10 feet activity   Assist  Walk 10 feet activity did not occur: Safety/medical concerns (fatigue, fear, generalized weakness)  Assist level: Contact Guard/Touching assist Assistive device: Walker-rolling   Walk 50 feet activity   Assist Walk 50 feet with 2 turns activity did not occur: Safety/medical concerns (fatigue, fear, generalized weakness)  Assist level: Minimal Assistance - Patient > 75% Assistive device: Walker-rolling    Walk 150 feet activity   Assist Walk 150 feet activity did not occur: Safety/medical concerns (fatigue, fear, generalized weakness)         Walk 10 feet on uneven surface  activity   Assist Walk 10 feet on uneven surfaces activity did not occur: Safety/medical concerns (fatigue, fear, generalized weakness)         Wheelchair     Assist Is the patient using a wheelchair?: Yes Type of Wheelchair: Manual Wheelchair activity did not occur: Safety/medical concerns (fatigue, fear, generalized weakness)         Wheelchair 50 feet with 2 turns activity    Assist    Wheelchair 50 feet with 2 turns activity did not occur: Safety/medical concerns (fatigue, fear, generalized weakness)       Wheelchair 150 feet activity     Assist  Wheelchair 150 feet activity did not occur: Safety/medical concerns (fatigue, fear, generalized weakness)       Blood pressure (!) 111/58, pulse 95, temperature 97.9 F (36.6 C), temperature source Axillary, resp. rate 17, height 5' 3.86"  (1.622 m), weight 71 kg, last menstrual period 12/08/2013, SpO2 92 %.    Medical Problem List and Plan: 1. Functional deficits secondary to debility after MV repair with sternal precautions             -patient may  shower if cover incisions- HD catheters in chest are out- also has sternal incisions that need to be covered             -ELOS/Goals: 10-14 days supervision to min A  Continue CIR therapies including PT, OT  2.  Mechanical MVR: INR therapeutic and heparin drip d/ced.  Pharmaceutical: Coumadin  --INR supra-therapeutic this am             -antiplatelet therapy: ASA 3. Pain Management:  Tylenol prn. Lidocaine patches prn. Denies pain 4. Insomnia: increase melatonin to 10mg  HS 4/20  -  Charge nurse spoke with her about PM nursing care.  5. Anxiety: neuropsych consulted 6. Skin/Wound Care: Routine pressure relief measures with frequent pressure relief measures for sacral decub.             --Interdry to skin folds.              --Add vitamin C and Zinc to promote wound healing 7. Fluids/Electrolytes/Nutrition: Strict I/O. Labs with HD.  --Will keep diet as regular to help with food choices             --Juven and nephro added. 8. MVR/TVR by Dr. Felipa Furnace [phone]): Continue Sternal precautions.              --PPM for CHB             --coumadin with INR goal 2.5-3.5 9.  Hypotension: Monitor for symptoms with increase in activity             --on midodrine, Staterra and Droxidopa. Blood pressures reviewed and have improved 10.  ESRD: Schedule HD at the end of the day. Renal diet with 1200 cc FR.  11. T2DM: Provided list of foods that are good for diabetes. D/c HS SSI. D/c daytime ISS, d/c breakfast aspart and instead add on semglee HS 6U 12. A fib: Monitor HR TID. No BB due to hypotension. HR continues to be elevated, continue to monitor             --on coumadin. 13. Anemia of chronic disease: Monitor H/H with HD labs/             --transfuse prn Hgb <7.0 or if symptomatic.  14.  Anxiety- pt cannot tolerate door/curtain closed 15. New O2 requirement with mild tachypnea/volume overload -  -volume mgt per nephrology with HD  -continue oxygen as needed--humidfy 16. Wound on left elbow: ordered manuka honey daily.  17. Vitamin D insufficiency: continue ergocalciferol 50,000U once per week for 7 weeks 18. Shortness of breath: improved, reviewed CXR results with her that show pleural effusion and atelectasis, incentive spirometer ordered, educated regarding its benefits 19. Epistaxis left nostril--likely exacerbated by O2 [place] and elevated INR  -Hgb reviewed and trending upward  -1mg  coumadin today  -will schedule afrin  -resolved  -humidified O2  -ocean nasal spray as possible 20. Tachycardia: continue to monitor HR TID 21. Overweight: provide dietary education, weight decreasing, transition from more short acting to long acting insulin as tolerated    LOS: 10 days A FACE TO FACE EVALUATION WAS PERFORMED  Colleen [name] 04/03/2023, 10:52 AM

## 2023-04-03 NOTE — Progress Notes (Signed)
[name] Progress Note   Subjective:    Were not able to do extra HD yesterday due to high pt census-  she is on today for her routine HD  Objective Vitals:   04/03/23 1310 04/03/23 1312 04/03/23 1330 04/03/23 1400  BP: 110/63  111/64 (!) 100/59  Pulse: (!) 103  89 90  Resp: 17  (!) 27 (!) 26  Temp:      TempSrc:      SpO2: 100%  100% 100%  Weight:  73.4 kg    Height:       Physical Exam General: Well appearing woman, NAD  facial asymetry Heart:RRR; no murmur; healing mid-line chest incision Lungs:Fine bibasilar rales present (sounds slightly improved from yesterday), clear in upper lobes Abdomen: soft Extremities: 2+ BLE edema and 2+ RUE edema Dialysis Access: LUE AVF + bruit  Filed Weights   04/02/23 0500 04/03/23 0500 04/03/23 1312  Weight: 70 kg 71 kg 73.4 kg    Intake/Output Summary (Last 24 hours) at 04/03/2023 1430 Last data filed at 04/03/2023 0900 Gross per 24 hour  Intake 477 ml  Output --  Net 477 ml    Additional Objective Labs: Basic Metabolic Panel: Recent Labs  Lab 03/29/23 0614 03/30/23 0745 04/01/23 1400  NA 129* 130* 135  K 3.7 4.4 3.0*  CL 92* 91* 94*  CO2 23 20* 27  GLUCOSE 214* 163* 83  BUN 31* 41* 12  CREATININE 5.62* 6.72* 2.55*  CALCIUM 7.6* 8.0* 7.7*  PHOS 4.6 5.4* 1.6*   Liver Function Tests: Recent Labs  Lab 03/29/23 0614 03/30/23 0745 04/01/23 1400  ALBUMIN 2.3* 2.4* 2.5*   No results for input(s): "LIPASE", "AMYLASE" in the last 168 hours. CBC: Recent Labs  Lab 03/28/23 0610 03/29/23 0614 03/30/23 0745 04/01/23 1400 04/03/23 1300  WBC 7.6 7.9 8.3 9.7 11.2*  HGB 10.2* 9.7* 10.5* 10.6* 10.4*  HCT 32.5* 31.9* 34.1* 34.2* 32.6*  MCV 91.8 93.5 92.7 90.2 88.8  PLT 179 192 246 273 233   Blood Culture    Component Value Date/Time   SDES BLOOD RIGHT HAND 12/11/2020 1151   SPECREQUEST  12/11/2020 1151    BOTTLES DRAWN AEROBIC AND ANAEROBIC Blood Culture adequate volume   CULT  12/11/2020 1151     NO GROWTH 5 DAYS Performed at [facility], [address], [place], [place] [address]    REPTSTATUS 12/16/2020 FINAL 12/11/2020 1151    Cardiac Enzymes: No results for input(s): "CKTOTAL", "CKMB", "CKMBINDEX", "TROPONINI" in the last 168 hours. CBG: Recent Labs  Lab 04/02/23 1123 04/02/23 1612 04/02/23 2050 04/03/23 0617 04/03/23 1135  GLUCAP 258* 236* 173* 245* 230*   Iron Studies: No results for input(s): "IRON", "TIBC", "TRANSFERRIN", "FERRITIN" in the last 72 hours. Lab Results  Component Value Date   INR 3.5 (H) 04/03/2023   INR 3.5 (H) 04/02/2023   INR 4.3 (HH) 04/01/2023   Studies/Results: No results found.  Medications:  anticoagulant sodium citrate      amiodarone  200 mg Oral Daily   aspirin EC  81 mg Oral Daily   atomoxetine  18 mg Oral Daily   Chlorhexidine Gluconate Cloth  6 each Topical Q0600   [START ON 04/04/2023] Chlorhexidine Gluconate Cloth  6 each Topical Q0600   cinacalcet  60 mg Oral Q M,W,F   darbepoetin (ARANESP) injection - DIALYSIS  60 mcg Subcutaneous Q Fri-1800   doxercalciferol  3 mcg Intravenous Q M,W,F-HD   droxidopa  500 mg Oral TID WC  famotidine  10 mg Oral BID   feeding supplement (NEPRO CARB STEADY)  237 mL Oral TID WC   insulin aspart  5 Units Subcutaneous BID AC   insulin glargine-yfgn  6 Units Subcutaneous QHS   leptospermum manuka honey  1 Application Topical Daily   melatonin  10 mg Oral QHS   midodrine  20 mg Oral TID WC   multivitamin  1 tablet Oral QHS   nutrition supplement (JUVEN)  1 packet Oral BID BM   polycarbophil  625 mg Oral Daily   simethicone  80 mg Oral QID   sodium chloride  1 spray Each Nare TID PC   Vitamin D (Ergocalciferol)  50,000 Units Oral Q7 days   warfarin  1 mg Oral ONCE-1600   Warfarin - Pharmacist Dosing Inpatient   Does not apply q1600    Dialysis Orders: MWF SW 3h  400/1.5  69.5kg 2K/2Ca bath  Heparin 3000  AVF LUA - last OP HD 3/13, post wt 69.5kg - sensipar 60mg  po tiw -  hectorol 3 mcg IV tiw - venofer 50 mg IV weekly   61yo F s/p [facility] admit 3/13-4/16/24 for MV replacement, TV ring, AV decalcification with removal of LV mass. Post-op, she have A-fib and CHB, underwent leadless PPM on 02/26/23. Hospitalization c/b hypotension, on midodrine + Strattera + Droxidopa. Will be on warfarin + ASA for coagulation. Transferred to CIR for rehab.   Assessment/Plan: 1. Valvular heart disease s/p MVR, TV ring, AoV decalcification with removal LV mass 02/18/23: On warfarin for anticoagulation. 2. ESRD: Will continue outpatient HD schedule -  She refuses to run > 3hr treatment, had been refusing extra HD for volume removal- but now is agreeable-  2 hour uf to pull fluid today /Thursday unfortunately this was unable to be done-  she is concerned that her BP is so low-  I told her we just need to navigate this new normal for her.  Reg HD today-  have so many to do tomorrow dont think is going to happen-  will likley plan for next on Monday  3. HYPOTN/volume: On midodrine 20mg  TID, droxidopa 500mg  TID, and atomoxetine 18mg  daily for hypotension. She is very edematous - will see what we can get off with dialysis, hopefully start slowly getting her weight down-  making progress- low temp and albumin. 4. Anemia of ESRD: Hgb 9.7 -  last Aranesp on 4/19. Monitor trend, will raise if no improvement. Is now above 10 5. Secondary hyperparathyroidism:  Corr/Phos at goal, on cinacalcet 60mg  TIW and hectorol. Hold binders for now since phos is reasonable -  think labs drawn on 4/24 pre HD are diluted or drawn later in HD  6. Nutrition:  On protein supplements 7. T2DM: On insulin, per primary. 8. A-fib: On amiodarone. 9. Debility: In CIR - follow progress.  Colleen [name]  [facility] 04/03/2023,2:30 PM  LOS: 10 days

## 2023-04-03 NOTE — Progress Notes (Signed)
Physical Therapy Session Note  Patient Details  Name: Colleen [name] MRN: [id number] Date of Birth: [birth year]  Today's Date: 04/03/2023 PT Individual Time: [phone] and 1100-1155 PT Individual Time Calculation (min): 71 min and 55 min  Short Term Goals: Week 1:  PT Short Term Goal 1 (Week 1): pt will transfer sit<>stand with LRAD and min A PT Short Term Goal 1 - Progress (Week 1): Progressing toward goal PT Short Term Goal 2 (Week 1): pt will transfer bed<>chair with LRAD and min A PT Short Term Goal 2 - Progress (Week 1): Met PT Short Term Goal 3 (Week 1): pt will ambulate 74ft with LRAD and min A PT Short Term Goal 3 - Progress (Week 1): Met Week 2:  PT Short Term Goal 1 (Week 2): STG=LTG due to LOS  Skilled Therapeutic Interventions/Progress Updates:   Treatment Session 1 Received pt semi-reclined in bed and denied any pain during session. Pt reported not sleeping well last night from all the interruptions - notified MD, and as a result pt extremely slow to move this morning. Session with emphasis on functional mobility/transfers, dressing, generalized strengthening and endurance, and gait training. Donned bilateral ted hose and non-skid socks in supine dependently for edema management. RN arrived to take vitals and administer medications. Pt on RA with SPO2 ranging from 88%-92% (improved with pursed lip breathing). Discussed ordering pt hospital bed for home use as pt is unable to lie flat and relies on bed features to sit EOB. Pt transferred semi-reclined<>sitting EOB with HOB elevated and supervision. Doffed gown and donned button up shirt with min A and brief/pants sitting EOB with max A. Stood from elevated EOB with RW and min A (again prefers to place BUE support on RW for balance despite encouragement not to) and required total A to pull brief/pants over hips - transferred bed<>WC stand<>pivot with RW and CGA. Pt sat in WC at sink and washed face/brushed teeth with set up assist and  increased time. In hallway, pt stood from [facility] with RW and light mod A and ambulated 76ft with RW and CGA to fatigue. Returned to room and performed the following seated exercises with emphasis on LE strength/ROM: -heel/toe raises 2x20  -hip adduction pillow squeezes 2x10 with 5 second isometric hold -hip abduction with grn TB 2x10 Concluded session with pt sitting in [facility] with all needs within reach and BLEs elevated for edema management.  Treatment Session 2 Received pt sitting in [facility] and denied any pain during session. Session with emphasis on functional mobility/transfers, toileting, generalized strengthening and endurance, and dynamic standing balance/coordination. Pt reported urge to toilet - declined ambulating into bathroom; therefore transferred to/from toilet with bedside commode over top using grab bar and CGA. Pt required total A for clothing management (per request) and pt continent of bowel. Replaced brief with max A and performed pericare in standing dependently and transported to/from room in [facility] dependently for time management purposes. Pt refused any further ambulation due to fatigue and with encouragement reluctantly agreed to participate in standing balance activity. Stood from [facility] with RW and light mod A x 2 trials and worked on dynamic standing balance performing alternating toe taps to cone 2x5 with CGA for balance. Pt requested to return to room and get ready for dialysis - NT arrived to check blood glucose. Pt stood with RW and mod A and transferred WC<>bed stand<>pivot with RW and CGA. Removed pants in standing with max A and ted hose and non-skid socks dependently. Removed button  up shirt and donned gown. Pt transferred sit<>supine with mod A for BLE management. Concluded session with pt semi-reclined in bed, needs within reach, and bed alarm on. Elevated BLE on pillows for edema management.   Therapy Documentation Precautions:  Precautions Precautions: Sternal, Fall,  ICD/Pacemaker Precaution Comments: Anxious. Monitor (O2 > 90%), and HR Restrictions Weight Bearing Restrictions: No Other Position/Activity Restrictions: sternal precautions  Therapy/Group: Individual Therapy Martin Majestic PT, DPT  04/03/2023, 7:09 AM

## 2023-04-03 NOTE — Progress Notes (Signed)
Occupational Therapy Session Note  Patient Details  Name: Colleen [name] MRN: [id number] Date of Birth: 1961-02-01  Today's Date: 04/03/2023 OT Individual Time: 0920-1030 OT Individual Time Calculation (min): 70 min    Short Term Goals: Week 2:  OT Short Term Goal 1 (Week 2): STG = LTG due to ELOS  Skilled Therapeutic Interventions/Progress Updates:  Skilled OT intervention completed with focus on BUE proximal endurance, visual scanning, cognition, ambulatory endurance. Pt received seated in w/c, agreeable to session. No pain reported however expressed high fatigue from lack of sleep and earlier PT session.  OT offered and encouraged a full shower as pt plans to do this at home but has strongly declined with previous offers. Pt screamed "why do you always harp on me about a bath!!! I said no." Educated pt on importance of completing ADLs at the level of function planned at home for safety education, as well as general psychological and emotional support however pt still declined.  Transported pt dependently in w/c <> gym for adherence to sternal precautions. Pt requesting to work on tasks seated.   Seated at table, pt completed the following activities for the above reasons: -Building/disassembling clip tower on partial vertical and full horizontal bars for proximal endurance/BUE AROM. Rest needed between hands for fatigue -Sliding pegboard following simple pattern. Required unreasonable amount of time for problem solving but able to fade assist from max A to min A -Card memory flip game- able to follow directions of only 2 cards at 1 time about 50% of the time, with cues needed, and min A for determining difference between cards due to vision impairment. However able to attend to task in loud environment without stopping  Pt agreeable to trial walking back to room in prep for home management mobility. Min A sit > stand using RW, then ambulated 33 ft with CGA. Encouraged pt to continue  ambulating as pt with great balance, however demanded she sit. Transported back to room dependently.   Pt remained seated in w/c, with all needs in reach at end of session.   Therapy Documentation Precautions:  Precautions Precautions: Sternal, Fall, ICD/Pacemaker Precaution Comments: Anxious. Monitor (O2 > 90%), and HR Restrictions Weight Bearing Restrictions: No Other Position/Activity Restrictions: sternal precautions    Therapy/Group: Individual Therapy  Melvyn Novas, MS, OTR/L  04/03/2023, 3:21 PM

## 2023-04-03 NOTE — Progress Notes (Addendum)
Family education arranged Tuesday, 4/30 9 A-12 PM

## 2023-04-04 DIAGNOSIS — R5381 Other malaise: Secondary | ICD-10-CM | POA: Diagnosis not present

## 2023-04-04 LAB — GLUCOSE, CAPILLARY
Glucose-Capillary: 141 mg/dL — ABNORMAL HIGH (ref 70–99)
Glucose-Capillary: 156 mg/dL — ABNORMAL HIGH (ref 70–99)
Glucose-Capillary: 208 mg/dL — ABNORMAL HIGH (ref 70–99)
Glucose-Capillary: 170 mg/dL — ABNORMAL HIGH (ref 70–99)

## 2023-04-04 LAB — PROTIME-INR
INR: 3 — ABNORMAL HIGH (ref 0.8–1.2)
Prothrombin Time: 30.9 seconds — ABNORMAL HIGH (ref 11.4–15.2)

## 2023-04-04 MED ORDER — CHLORHEXIDINE GLUCONATE CLOTH 2 % EX PADS: 6.0000 | MEDICATED_PAD | Freq: Every day | CUTANEOUS | Status: DC

## 2023-04-04 MED ORDER — ATOMOXETINE HCL 18 MG PO CAPS
18.0000 mg | ORAL_CAPSULE | Freq: Every day | ORAL | Status: DC
  Administered 2023-04-07 – 2023-04-09 (×3): 18 mg via ORAL
  Filled 2023-04-04 (×3): qty 1

## 2023-04-04 MED ORDER — ATOMOXETINE HCL 10 MG PO CAPS
20.0000 mg | ORAL_CAPSULE | Freq: Every day | ORAL | Status: AC
  Administered 2023-04-05 – 2023-04-06 (×2): 20 mg via ORAL
  Filled 2023-04-04 (×2): qty 2

## 2023-04-04 MED ORDER — WARFARIN SODIUM 2 MG PO TABS
2.0000 mg | ORAL_TABLET | Freq: Once | ORAL | Status: AC
  Administered 2023-04-04: 2 mg via ORAL
  Filled 2023-04-04: qty 1

## 2023-04-04 NOTE — Progress Notes (Signed)
PROGRESS NOTE   Subjective/Complaints:  Pt reports she wants to get up OOB today since no therapy- let nursing know No complaints this AM/no concerns.   Still using O2 2L by [place]  ROS:  Pt denies SOB, abd pain, CP, N/V/C/D, and vision changes Except for HPI Objective:   No results found. Recent Labs    04/03/23 1300  WBC 11.2*  HGB 10.4*  HCT 32.6*  PLT 233   Recent Labs    04/03/23 1300  NA 132*  K 4.3  CL 91*  CO2 22  GLUCOSE 216*  BUN 36*  CREATININE 6.86*  CALCIUM 7.8*    Intake/Output Summary (Last 24 hours) at 04/04/2023 1604 Last data filed at 04/04/2023 1247 Gross per 24 hour  Intake 170 ml  Output 3500 ml  Net -3330 ml     Pressure Injury 03/24/23 Buttocks Left Stage 2 -  Partial thickness loss of dermis presenting as a shallow open injury with a red, pink wound bed without slough. (Active)  03/24/23 1500  Location: Buttocks  Location Orientation: Left  Staging: Stage 2 -  Partial thickness loss of dermis presenting as a shallow open injury with a red, pink wound bed without slough.  Wound Description (Comments):   Present on Admission: Yes    Physical Exam: Vital Signs Blood pressure (!) 102/54, pulse 91, temperature 98.2 F (36.8 C), temperature source Oral, resp. rate 20, height 5' 3.86" (1.622 m), weight 70.3 kg, last menstrual period 12/08/2013, SpO2 96 %.    General: awake, alert, appropriate, sitting up slightly in bed; wearing O2 2L by [place]- humidified; NAD HENT: conjugate gaze; oropharynx a little dry CV: regular rate; no JVD Pulmonary: shallow breaths- decreased at bases; but no W/R/R GI: soft, NT, ND, (+)BS Psychiatric: appropriate but anxious Neurological: Ox3  Musculoskeletal:     Cervical back: Neck supple. No tenderness.     Comments: Ue's 5-/5 B/L HF/KE 3+/5 B/L; DF/PF 4+/5 B/L  Skin:    General: chest wall incisions CDI    Comments: 2-3+ edema BLE- and some wooding  in hands and arms- from reduced swelling--stable to improved L buttock pressure ulcer- Stage II- ~1.5 x 0.5 cm- pink- slightly open Incision closed on R chest and L chest from prior HD catheters Fistula L upper arm- (+) thrill  +LE edema Neurological:     Mental Status: She is alert.     Comments: Right facial weakness without dysarthria. Speech slow but clear. Oriented X 4. Intact to light touch in all 4 extremities per pt  Fully oriented       Assessment/Plan: 1. Functional deficits which require 3+ hours per day of interdisciplinary therapy in a comprehensive inpatient rehab setting. Physiatrist is providing close team supervision and 24 hour management of active medical problems listed below. Physiatrist and rehab team continue to assess barriers to discharge/monitor patient progress toward functional and medical goals  Care Tool:  Bathing    Body parts bathed by patient: Right arm, Left arm, Chest, Abdomen, Face, Right upper leg, Left upper leg   Body parts bathed by helper: Front perineal area, Buttocks, Right lower leg, Left lower leg     Bathing  assist Assist Level: Supervision/Verbal cueing     Upper Body Dressing/Undressing Upper body dressing   What is the patient wearing?: Button up shirt    Upper body assist Assist Level: Minimal Assistance - Patient > 75%    Lower Body Dressing/Undressing Lower body dressing      What is the patient wearing?: Incontinence brief, Pants     Lower body assist Assist for lower body dressing: Moderate Assistance - Patient 50 - 74%     Toileting Toileting    Toileting assist Assist for toileting: Moderate Assistance - Patient 50 - 74%     Transfers Chair/bed transfer  Transfers assist  Chair/bed transfer activity did not occur: Safety/medical concerns (weakness, fear)  Chair/bed transfer assist level: Contact Guard/Touching assist     Locomotion Ambulation   Ambulation assist   Ambulation activity did not  occur: Safety/medical concerns (fatigue, fear, generalized weakness)  Assist level: Contact Guard/Touching assist Assistive device: Walker-rolling Max distance: 67ft   Walk 10 feet activity   Assist  Walk 10 feet activity did not occur: Safety/medical concerns (fatigue, fear, generalized weakness)  Assist level: Contact Guard/Touching assist Assistive device: Walker-rolling   Walk 50 feet activity   Assist Walk 50 feet with 2 turns activity did not occur: Safety/medical concerns (fatigue, fear, generalized weakness)  Assist level: Minimal Assistance - Patient > 75% Assistive device: Walker-rolling    Walk 150 feet activity   Assist Walk 150 feet activity did not occur: Safety/medical concerns (fatigue, fear, generalized weakness)         Walk 10 feet on uneven surface  activity   Assist Walk 10 feet on uneven surfaces activity did not occur: Safety/medical concerns (fatigue, fear, generalized weakness)         Wheelchair     Assist Is the patient using a wheelchair?: Yes Type of Wheelchair: Manual Wheelchair activity did not occur: Safety/medical concerns (fatigue, fear, generalized weakness)         Wheelchair 50 feet with 2 turns activity    Assist    Wheelchair 50 feet with 2 turns activity did not occur: Safety/medical concerns (fatigue, fear, generalized weakness)       Wheelchair 150 feet activity     Assist  Wheelchair 150 feet activity did not occur: Safety/medical concerns (fatigue, fear, generalized weakness)       Blood pressure (!) 102/54, pulse 91, temperature 98.2 F (36.8 C), temperature source Oral, resp. rate 20, height 5' 3.86" (1.622 m), weight 70.3 kg, last menstrual period 12/08/2013, SpO2 96 %.    Medical Problem List and Plan: 1. Functional deficits secondary to debility after MV repair with sternal precautions             -patient may  shower if cover incisions- HD catheters in chest are out- also has sternal  incisions that need to be covered             -ELOS/Goals: 10-14 days supervision to min A  Con't CIR PT and OT 2.  Mechanical MVR: INR therapeutic and heparin drip d/ced.  Pharmaceutical: Coumadin  --INR supra-therapeutic this am  4/27- down to 3.0- per pharmacy             -antiplatelet therapy: ASA 3. Pain Management:  Tylenol prn. Lidocaine patches prn. Denies pain 4. Insomnia: increase melatonin to 10mg  HS 4/20  -Charge nurse spoke with her about PM nursing care.  5. Anxiety: neuropsych consulted 6. Skin/Wound Care: Routine pressure relief measures with frequent pressure relief measures  for sacral decub.             --Interdry to skin folds.              --Add vitamin C and Zinc to promote wound healing 7. Fluids/Electrolytes/Nutrition: Strict I/O. Labs with HD.  --Will keep diet as regular to help with food choices             --Juven and nephro added. 8. MVR/TVR by Dr. Felipa Furnace [phone]): Continue Sternal precautions.              --PPM for CHB             --coumadin with INR goal 2.5-3.5 9.  Hypotension: Monitor for symptoms with increase in activity             --on midodrine, Staterra and Droxidopa. Blood pressures reviewed and have improved 10.  ESRD: Schedule HD at the end of the day. Renal diet with 1200 cc FR.  11. T2DM: Provided list of foods that are good for diabetes. D/c HS SSI. D/c daytime ISS, d/c breakfast aspart and instead add on semglee HS 6U  4/27- doing better- 141-162 since added long acting insulin- con't to monitor for trend 12. A fib: Monitor HR TID. No BB due to hypotension. HR continues to be elevated, continue to monitor             --on coumadin.  4.27- HR running 80s-90's- con't to monitor trend 13. Anemia of chronic disease: Monitor H/H with HD labs/             --transfuse prn Hgb <7.0 or if symptomatic.  14. Anxiety- pt cannot tolerate door/curtain closed 15. New O2 requirement with mild tachypnea/volume overload -  -volume mgt per nephrology  with HD  -continue oxygen as needed--humidfy  4/27- back on O2 this AM- felt a little winded 16. Wound on left elbow: ordered manuka honey daily.  17. Vitamin D insufficiency: continue ergocalciferol 50,000U once per week for 7 weeks 18. Shortness of breath: improved, reviewed CXR results with her that show pleural effusion and atelectasis, incentive spirometer ordered, educated regarding its benefits 19. Epistaxis left nostril--likely exacerbated by O2 [place] and elevated INR  -Hgb reviewed and trending upward  -1mg  coumadin today  -will schedule afrin  -resolved  -humidified O2  -ocean nasal spray as possible 20. Tachycardia: continue to monitor HR TID 21. Overweight: provide dietary education, weight decreasing, transition from more short acting to long acting insulin as tolerated    LOS: 11 days A FACE TO FACE EVALUATION WAS PERFORMED  Kaleo Condrey 04/04/2023, 4:04 PM

## 2023-04-04 NOTE — Progress Notes (Signed)
[name] Progress Note   Subjective:    Were able to pull another 3.5 L with dialysis yesterday  Objective Vitals:   04/03/23 1621 04/03/23 1738 04/03/23 1921 04/04/23 0514  BP: 98/62 (!) 90/47 95/60 (!) 103/53  Pulse: 90 89 90 90  Resp: (!) 21 18 18 18   Temp:  (!) 97.5 F (36.4 C) 97.8 F (36.6 C) 98 F (36.7 C)  TempSrc:  Oral Oral   SpO2: 98% 94% 97% 100%  Weight: 70.3 kg     Height:       Physical Exam General: Well appearing woman, NAD  facial asymetry Heart:RRR; no murmur; healing mid-line chest incision Lungs:Fine bibasilar rales present (sounds slightly improved from yesterday), clear in upper lobes Abdomen: soft Extremities: + BLE edema and + RUE edema--still present but improved Dialysis Access: LUE AVF + bruit  Filed Weights   04/03/23 0500 04/03/23 1312 04/03/23 1621  Weight: 71 kg 73.4 kg 70.3 kg    Intake/Output Summary (Last 24 hours) at 04/04/2023 1243 Last data filed at 04/04/2023 0745 Gross per 24 hour  Intake 120 ml  Output 3500 ml  Net -3380 ml    Additional Objective Labs: Basic Metabolic Panel: Recent Labs  Lab 03/30/23 0745 04/01/23 1400 04/03/23 1300  NA 130* 135 132*  K 4.4 3.0* 4.3  CL 91* 94* 91*  CO2 20* 27 22  GLUCOSE 163* 83 216*  BUN 41* 12 36*  CREATININE 6.72* 2.55* 6.86*  CALCIUM 8.0* 7.7* 7.8*  PHOS 5.4* 1.6* 5.2*   Liver Function Tests: Recent Labs  Lab 03/30/23 0745 04/01/23 1400 04/03/23 1300  ALBUMIN 2.4* 2.5* 2.4*   No results for input(s): "LIPASE", "AMYLASE" in the last 168 hours. CBC: Recent Labs  Lab 03/29/23 0614 03/30/23 0745 04/01/23 1400 04/03/23 1300  WBC 7.9 8.3 9.7 11.2*  HGB 9.7* 10.5* 10.6* 10.4*  HCT 31.9* 34.1* 34.2* 32.6*  MCV 93.5 92.7 90.2 88.8  PLT 192 246 273 233   Blood Culture    Component Value Date/Time   SDES BLOOD RIGHT HAND 12/11/2020 1151   SPECREQUEST  12/11/2020 1151    BOTTLES DRAWN AEROBIC AND ANAEROBIC Blood Culture adequate volume   CULT   12/11/2020 1151    NO GROWTH 5 DAYS Performed at [facility], [address], [place], [place] [address]    REPTSTATUS 12/16/2020 FINAL 12/11/2020 1151    Cardiac Enzymes: No results for input(s): "CKTOTAL", "CKMB", "CKMBINDEX", "TROPONINI" in the last 168 hours. CBG: Recent Labs  Lab 04/03/23 1135 04/03/23 1717 04/03/23 2100 04/04/23 0611 04/04/23 1151  GLUCAP 230* 142* 162* 141* 156*   Iron Studies: No results for input(s): "IRON", "TIBC", "TRANSFERRIN", "FERRITIN" in the last 72 hours. Lab Results  Component Value Date   INR 3.0 (H) 04/04/2023   INR 3.5 (H) 04/03/2023   INR 3.5 (H) 04/02/2023   Studies/Results: No results found.  Medications:    amiodarone  200 mg Oral Daily   aspirin EC  81 mg Oral Daily   atomoxetine  18 mg Oral Daily   Chlorhexidine Gluconate Cloth  6 each Topical Q0600   Chlorhexidine Gluconate Cloth  6 each Topical Q0600   cinacalcet  60 mg Oral Q M,W,F   darbepoetin (ARANESP) injection - DIALYSIS  60 mcg Subcutaneous Q Fri-1800   doxercalciferol  3 mcg Intravenous Q M,W,F-HD   droxidopa  500 mg Oral TID WC   famotidine  10 mg Oral BID   feeding supplement (NEPRO CARB STEADY)  237 mL Oral TID WC   insulin aspart  5 Units Subcutaneous BID AC   insulin glargine-yfgn  6 Units Subcutaneous QHS   leptospermum manuka honey  1 Application Topical Daily   melatonin  10 mg Oral QHS   midodrine  20 mg Oral TID WC   multivitamin  1 tablet Oral QHS   nutrition supplement (JUVEN)  1 packet Oral BID BM   polycarbophil  625 mg Oral Daily   simethicone  80 mg Oral QID   sodium chloride  1 spray Each Nare TID PC   Vitamin D (Ergocalciferol)  50,000 Units Oral Q7 days   warfarin  2 mg Oral ONCE-1600   Warfarin - Pharmacist Dosing Inpatient   Does not apply q1600    Dialysis Orders: MWF SW 3h  400/1.5  69.5kg 2K/2Ca bath  Heparin 3000  AVF LUA - last OP HD 3/13, post wt 69.5kg - sensipar 60mg  po tiw - hectorol 3 mcg IV tiw - venofer 50 mg  IV weekly   61yo F s/p [facility] admit 3/13-4/16/24 for MV replacement, TV ring, AV decalcification with removal of LV mass. Post-op, she have A-fib and CHB, underwent leadless PPM on 02/26/23. Hospitalization c/b hypotension, on midodrine + Strattera + Droxidopa. Will be on warfarin + ASA for coagulation. Transferred to CIR for rehab.   Assessment/Plan: 1. Valvular heart disease s/p MVR, TV ring, AoV decalcification with removal LV mass 02/18/23: On warfarin for anticoagulation. 2. ESRD: Will continue outpatient HD schedule MWF-  She refuses to run > 3hr treatment, had been refusing extra HD for volume removal- but then was agreeable to an extra treatment but unfortunately this was unable to be done last week due to high dialysis census she is concerned that her BP is so low-  I told her we just need to navigate this new normal for her.  Reg HD Monday, then will assess next week for extra treatment need  3. HYPOTN/volume: On midodrine 20mg  TID, droxidopa 500mg  TID, and atomoxetine 18mg  daily for hypotension. She is very edematous - will see what we can get off with dialysis, hopefully start slowly getting her weight down-  making progress- low temp and albumin. 4. Anemia of ESRD: Hgb 9.7 -  last Aranesp on 4/19. Monitor trend, will raise if no improvement. Is now above 10 5. Secondary hyperparathyroidism:  Corr/Phos at goal, on cinacalcet 60mg  TIW and hectorol. Hold binders for now since phos is reasonable -  think labs drawn on 4/24 pre HD are diluted or drawn later in HD  6. Nutrition:  On protein supplements 7. T2DM: On insulin, per primary. 8. A-fib: On amiodarone. 9. Debility: In CIR - follow progress.  She says she will be discharged on 5/4  Patient will not be physically seen tomorrow.  We will revisit her on Monday.  Dialysis orders will be written for Monday.  Call if any concerns  [name]  [facility] 04/04/2023,12:43 PM  LOS: 11 days

## 2023-04-04 NOTE — Progress Notes (Signed)
ANTICOAGULATION CONSULT NOTE   Pharmacy Consult for Warfarin , INR goal 2.5-3.5  Indication: Mechanical MVR  Allergies  Allergen Reactions   Farxiga [Dapagliflozin] Other (See Comments)    Cannot take due to kidneys   Hydrocodone Other (See Comments)    "Increase Glucose level," per pt   Kombiglyze Xr [Saxagliptin-Metformin Er] Other (See Comments)    Cannot take due to kidneys    Patient Measurements: Height: 5' 3.86" (162.2 cm) (from [facility] records on 02/18/23) Weight: 70.3 kg (154 lb 15.7 oz) (BED) IBW/kg (Calculated) : 54.37 Heparin Dosing Weight: 70.1 kg   Vital Signs: Temp: 98 F (36.7 C) (04/27 0514) BP: 103/53 (04/27 0514) Pulse Rate: 90 (04/27 0514)  Labs: Recent Labs    04/01/23 1400 04/02/23 0936 04/03/23 0627 04/03/23 1300 04/04/23 0530  HGB 10.6*  --   --  10.4*  --   HCT 34.2*  --   --  32.6*  --   PLT 273  --   --  233  --   LABPROT  --  35.1* 34.7*  --  30.9*  INR  --  3.5* 3.5*  --  3.0*  CREATININE 2.55*  --   --  6.86*  --      Estimated Creatinine Clearance: 8.4 mL/min (A) (by C-G formula based on SCr of 6.86 mg/dL (H)).   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 Developed CHB and Afib post-op, 3/21 underwent leadless PPM placement. Patient has a history of  DVT/PE. 2016, 2019)  No longer on [facility] prior to [facility] admission. Has been off Eliquis for years.   [facility] started her on Warfarin ~ 3/16.  Of note: Amiodarone started ~3/21 w/load, 4/10 started Amio 200mg  QD.  Warfarin doses at [facility]: 2mg , 2mg , 3mg , 3mg , 3.5mg  ,3.5mg   from 4/10 -03/23/23    Pharmacy consult for IV heparin and Warfarin.  Heparin drip discontinued on 4/17 due to therapeutic INR.   Hgb 10.6, plt wnl. Patient has had some epistaxis that is currently noted as resolved. INR down to 3 today. (doses held 4/20-24). PO intake looks to be extremely variable; between 0, 10, and 50% the last few days. Pt did not want breakfast this morning. Since patient is not eating regularly, will be conservative with dosing.  Goal of Therapy:  INR 2.5-3.5 (mechantcal MVR)  Plan:  Warfarin 2 mg PO x1 Monitor daily INR, CBC, clinical course, s/sx of bleed, PO intake/diet, Drug-Drug Interactions   Ulyses Southward, PharmD, Sam Rayburn, AAHIVP, CPP Infectious Disease Pharmacist 04/04/2023 11:16 AM

## 2023-04-05 DIAGNOSIS — R5381 Other malaise: Secondary | ICD-10-CM | POA: Diagnosis not present

## 2023-04-05 LAB — PROTIME-INR
INR: 2.8 — ABNORMAL HIGH (ref 0.8–1.2)
Prothrombin Time: 29.3 seconds — ABNORMAL HIGH (ref 11.4–15.2)

## 2023-04-05 LAB — GLUCOSE, CAPILLARY
Glucose-Capillary: 151 mg/dL — ABNORMAL HIGH (ref 70–99)
Glucose-Capillary: 232 mg/dL — ABNORMAL HIGH (ref 70–99)
Glucose-Capillary: 247 mg/dL — ABNORMAL HIGH (ref 70–99)
Glucose-Capillary: 116 mg/dL — ABNORMAL HIGH (ref 70–99)

## 2023-04-05 MED ORDER — WARFARIN SODIUM 3 MG PO TABS
3.0000 mg | ORAL_TABLET | Freq: Once | ORAL | Status: AC
  Administered 2023-04-05: 3 mg via ORAL
  Filled 2023-04-05: qty 1

## 2023-04-05 NOTE — Progress Notes (Signed)
Physical Therapy Session Note  Patient Details  Name: Colleen [name] MRN: [id number] Date of Birth: 13-Jun-1961  Today's Date: 04/05/2023 PT Individual Time: 1116-1204 PT Individual Time Calculation (min): 48 min   Short Term Goals: Week 2:  PT Short Term Goal 1 (Week 2): STG=LTG due to LOS  Skilled Therapeutic Interventions/Progress Updates:   Pt seated in wheelchair in room upon PT arrival. Reports no pain. O2 on in room; doffed for tx. PT assisted with TED hose donning. Agreeable to treatment.  Gait training was today's focus. Pt states her primary goal is to be able to walk without an assistive device. STS from w/c mod A to initiate and CGA to complete. GT x 35 ft, x 53 ft, x 58 ft with RW with PT CGA and verbal cues for stride and making sure pt was not bearing down through her upper extremities due to sternal precautions. PT advised pt after each STS, to stand for a couple of seconds to allow her body to settle, which she stated she appreciated. Pt's brother was present for treatment.  Pt was left in room in wheelchair with brother present. No pain reported; call light within reach. Phone within reach. Lap belt alarm on. Pt pleased with today's progress.      Therapy Documentation Precautions:  Precautions Precautions: Sternal, Fall, ICD/Pacemaker Precaution Comments: Anxious. Monitor (O2 > 90%), and HR Restrictions Weight Bearing Restrictions: No Other Position/Activity Restrictions: sternal precautions    Therapy/Group: Individual Therapy  Luna Fuse 04/05/2023, 12:37 PM

## 2023-04-05 NOTE — Progress Notes (Signed)
ANTICOAGULATION CONSULT NOTE   Pharmacy Consult for Warfarin , INR goal 2.5-3.5  Indication: Mechanical MVR  Allergies  Allergen Reactions   Farxiga [Dapagliflozin] Other (See Comments)    Cannot take due to kidneys   Hydrocodone Other (See Comments)    "Increase Glucose level," per pt   Kombiglyze Xr [Saxagliptin-Metformin Er] Other (See Comments)    Cannot take due to kidneys    Patient Measurements: Height: 5' 3.86" (162.2 cm) (from [facility] records on 02/18/23) Weight: 69.9 kg (154 lb 1.6 oz) IBW/kg (Calculated) : 54.37 Heparin Dosing Weight: 70.1 kg   Vital Signs: Temp: 98 F (36.7 C) (04/28 0449) BP: 118/62 (04/28 0449) Pulse Rate: 90 (04/28 0449)  Labs: Recent Labs    04/03/23 0627 04/03/23 1300 04/04/23 0530 04/05/23 0816  HGB  --  10.4*  --   --   HCT  --  32.6*  --   --   PLT  --  233  --   --   LABPROT 34.7*  --  30.9* 29.3*  INR 3.5*  --  3.0* 2.8*  CREATININE  --  6.86*  --   --     Estimated Creatinine Clearance: 8.3 mL/min (A) (by C-G formula based on SCr of 6.86 mg/dL (H)).   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 Developed CHB and Afib post-op, 3/21 underwent leadless PPM placement. Patient has a history of DVT/PE. 2016, 2019)  No longer on [facility] prior to [facility]  admission. Has been off Eliquis for years. [facility] started her on Warfarin ~ 3/16. Pharmacy consulted for warfarin dosing.   Of note: Amiodarone started ~3/21 w/load, 4/10 started Amio 200mg  QD. Anticipate amiodarone effect on warfarin dosing should be stable at this time. Patient with mild epistaxis 4/19-4/22, likely related to nasal cannula, now resolved. Note patient with poor PO intake ranging 0-65% of meals.   INR down to 2.8 and is therapeutic. Anticipate patient will need ~2-2.5mg  daily to maintain in goal unless PO intake improves.     Goal of Therapy:  INR 2.5-3.5 (mechanical MVR) Monitor platelets per anticoagulation protocol: Yes   Plan:  Warfarin 3 mg PO x1 Monitor daily INR until stable then decrease frequency, weekly CBC   Alphia Moh, PharmD, BCPS, BCCP Clinical Pharmacist  Please check AMION for all [facility] Pharmacy phone numbers After 10:00 PM, call Main Pharmacy [phone]

## 2023-04-05 NOTE — Progress Notes (Signed)
PROGRESS NOTE   Subjective/Complaints:  Pt reports mainly using O2 2L at night, but not during the day.  Doesn't feel like needs during day.  Trying to have BM- waiting for nursing.     ROS:   Pt denies SOB, abd pain, CP, N/V/C/D, and vision changes  Except for HPI Objective:   No results found. Recent Labs    04/03/23 1300  WBC 11.2*  HGB 10.4*  HCT 32.6*  PLT 233   Recent Labs    04/03/23 1300  NA 132*  K 4.3  CL 91*  CO2 22  GLUCOSE 216*  BUN 36*  CREATININE 6.86*  CALCIUM 7.8*    Intake/Output Summary (Last 24 hours) at 04/05/2023 1106 Last data filed at 04/04/2023 1720 Gross per 24 hour  Intake 524 ml  Output --  Net 524 ml     Pressure Injury 03/24/23 Buttocks Left Stage 2 -  Partial thickness loss of dermis presenting as a shallow open injury with a red, pink wound bed without slough. (Active)  03/24/23 1500  Location: Buttocks  Location Orientation: Left  Staging: Stage 2 -  Partial thickness loss of dermis presenting as a shallow open injury with a red, pink wound bed without slough.  Wound Description (Comments):   Present on Admission: Yes    Physical Exam: Vital Signs Blood pressure 118/62, pulse 90, temperature 98 F (36.7 C), resp. rate 16, height 5' 3.86" (1.622 m), weight 69.9 kg, last menstrual period 12/08/2013, SpO2 100 %.     General: awake, alert, appropriate, laying in bed; appears comfortable except need s to have BM; NAD HENT: conjugate gaze; oropharynx moist- O2 on lap, not face CV: regular rate; no JVD Pulmonary: CTA B/L; no W/R/R- good air movement GI: soft, NT, ND, (+)BS- hyperactive Psychiatric: appropriate Neurological: Ox3  Musculoskeletal:     Cervical back: Neck supple. No tenderness.     Comments: Ue's 5-/5 B/L HF/KE 3+/5 B/L; DF/PF 4+/5 B/L  Skin:    General: chest wall incisions CDI    Comments: 2-3+ edema BLE- and some wooding in hands and arms-  from reduced swelling--stable to improved L buttock pressure ulcer- Stage II- ~1.5 x 0.5 cm- pink- slightly open Incision closed on R chest and L chest from prior HD catheters Fistula L upper arm- (+) thrill  +LE edema Neurological:     Mental Status: She is alert.     Comments: Right facial weakness without dysarthria. Speech slow but clear. Oriented X 4. Intact to light touch in all 4 extremities per pt  Fully oriented       Assessment/Plan: 1. Functional deficits which require 3+ hours per day of interdisciplinary therapy in a comprehensive inpatient rehab setting. Physiatrist is providing close team supervision and 24 hour management of active medical problems listed below. Physiatrist and rehab team continue to assess barriers to discharge/monitor patient progress toward functional and medical goals  Care Tool:  Bathing    Body parts bathed by patient: Right arm, Left arm, Chest, Abdomen, Face, Right upper leg, Left upper leg   Body parts bathed by helper: Front perineal area, Buttocks, Right lower leg, Left lower leg  Bathing assist Assist Level: Supervision/Verbal cueing     Upper Body Dressing/Undressing Upper body dressing   What is the patient wearing?: Button up shirt    Upper body assist Assist Level: Minimal Assistance - Patient > 75%    Lower Body Dressing/Undressing Lower body dressing      What is the patient wearing?: Incontinence brief, Pants     Lower body assist Assist for lower body dressing: Moderate Assistance - Patient 50 - 74%     Toileting Toileting    Toileting assist Assist for toileting: Moderate Assistance - Patient 50 - 74%     Transfers Chair/bed transfer  Transfers assist  Chair/bed transfer activity did not occur: Safety/medical concerns (weakness, fear)  Chair/bed transfer assist level: Contact Guard/Touching assist     Locomotion Ambulation   Ambulation assist   Ambulation activity did not occur: Safety/medical  concerns (fatigue, fear, generalized weakness)  Assist level: Contact Guard/Touching assist Assistive device: Walker-rolling Max distance: 52ft   Walk 10 feet activity   Assist  Walk 10 feet activity did not occur: Safety/medical concerns (fatigue, fear, generalized weakness)  Assist level: Contact Guard/Touching assist Assistive device: Walker-rolling   Walk 50 feet activity   Assist Walk 50 feet with 2 turns activity did not occur: Safety/medical concerns (fatigue, fear, generalized weakness)  Assist level: Minimal Assistance - Patient > 75% Assistive device: Walker-rolling    Walk 150 feet activity   Assist Walk 150 feet activity did not occur: Safety/medical concerns (fatigue, fear, generalized weakness)         Walk 10 feet on uneven surface  activity   Assist Walk 10 feet on uneven surfaces activity did not occur: Safety/medical concerns (fatigue, fear, generalized weakness)         Wheelchair     Assist Is the patient using a wheelchair?: Yes Type of Wheelchair: Manual Wheelchair activity did not occur: Safety/medical concerns (fatigue, fear, generalized weakness)         Wheelchair 50 feet with 2 turns activity    Assist    Wheelchair 50 feet with 2 turns activity did not occur: Safety/medical concerns (fatigue, fear, generalized weakness)       Wheelchair 150 feet activity     Assist  Wheelchair 150 feet activity did not occur: Safety/medical concerns (fatigue, fear, generalized weakness)       Blood pressure 118/62, pulse 90, temperature 98 F (36.7 C), resp. rate 16, height 5' 3.86" (1.622 m), weight 69.9 kg, last menstrual period 12/08/2013, SpO2 100 %.    Medical Problem List and Plan: 1. Functional deficits secondary to debility after MV repair with sternal precautions             -patient may  shower if cover incisions- HD catheters in chest are out- also has sternal incisions that need to be covered              -ELOS/Goals: 10-14 days supervision to min A  Con't CIR PT and OT 2.  Mechanical MVR: INR therapeutic and heparin drip d/ced.  Pharmaceutical: Coumadin  --INR supra-therapeutic this am  4/27- down to 3.0- per pharmacy             -antiplatelet therapy: ASA 3. Pain Management:  Tylenol prn. Lidocaine patches prn. Denies pain 4. Insomnia: increase melatonin to 10mg  HS 4/20  -Charge nurse spoke with her about PM nursing care.  5. Anxiety: neuropsych consulted 6. Skin/Wound Care: Routine pressure relief measures with frequent pressure relief measures for sacral decub.             --  Interdry to skin folds.              --Add vitamin C and Zinc to promote wound healing 7. Fluids/Electrolytes/Nutrition: Strict I/O. Labs with HD.  --Will keep diet as regular to help with food choices             --Juven and nephro added. 8. MVR/TVR by Dr. Felipa Furnace [phone]): Continue Sternal precautions.              --PPM for CHB             --coumadin with INR goal 2.5-3.5 9.  Hypotension: Monitor for symptoms with increase in activity             --on midodrine, Staterra and Droxidopa. Blood pressures reviewed and have improved  4/28- Well controlled- no hypotension-  10.  ESRD: Schedule HD at the end of the day. Renal diet with 1200 cc FR.  11. T2DM: Provided list of foods that are good for diabetes. D/c HS SSI. D/c daytime ISS, d/c breakfast aspart and instead add on semglee HS 6U  4/27- doing better- 141-162 since added long acting insulin- con't to monitor for trend 12. A fib: Monitor HR TID. No BB due to hypotension. HR continues to be elevated, continue to monitor             --on coumadin.  4.27- HR running 80s-90's- con't to monitor trend 13. Anemia of chronic disease: Monitor H/H with HD labs/             --transfuse prn Hgb <7.0 or if symptomatic.  14. Anxiety- pt cannot tolerate door/curtain closed 15. New O2 requirement with mild tachypnea/volume overload -  -volume mgt per nephrology with  HD  -continue oxygen as needed--humidfy  4/28- only uses O2 at night- on lap this AM 16. Wound on left elbow: ordered manuka honey daily.  17. Vitamin D insufficiency: continue ergocalciferol 50,000U once per week for 7 weeks 18. Shortness of breath: improved, reviewed CXR results with her that show pleural effusion and atelectasis, incentive spirometer ordered, educated regarding its benefits 19. Epistaxis left nostril--likely exacerbated by O2 [place] and elevated INR  -Hgb reviewed and trending upward  -1mg  coumadin today  -will schedule afrin  -resolved  -humidified O2  -ocean nasal spray as possible 20. Tachycardia: continue to monitor HR TID  4/28- HR in 90s- con't to monitor trend 21. Overweight: provide dietary education, weight decreasing, transition from more short acting to long acting insulin as tolerated    LOS: 12 days A FACE TO FACE EVALUATION WAS PERFORMED  Claude Swendsen 04/05/2023, 11:06 AM

## 2023-04-06 ENCOUNTER — Encounter (HOSPITAL_COMMUNITY): Payer: Self-pay

## 2023-04-06 ENCOUNTER — Other Ambulatory Visit (HOSPITAL_COMMUNITY): Payer: Self-pay

## 2023-04-06 DIAGNOSIS — R5381 Other malaise: Secondary | ICD-10-CM | POA: Diagnosis not present

## 2023-04-06 LAB — CBC
HCT: 33.6 % — ABNORMAL LOW (ref 36.0–46.0)
Hemoglobin: 10.6 g/dL — ABNORMAL LOW (ref 12.0–15.0)
MCH: 28.3 pg (ref 26.0–34.0)
MCHC: 31.5 g/dL (ref 30.0–36.0)
MCV: 89.6 fL (ref 80.0–100.0)
Platelets: 170 10*3/uL (ref 150–400)
RBC: 3.75 MIL/uL — ABNORMAL LOW (ref 3.87–5.11)
RDW: 19.3 % — ABNORMAL HIGH (ref 11.5–15.5)
WBC: 9 10*3/uL (ref 4.0–10.5)
nRBC: 2 % — ABNORMAL HIGH (ref 0.0–0.2)

## 2023-04-06 LAB — GLUCOSE, CAPILLARY
Glucose-Capillary: 133 mg/dL — ABNORMAL HIGH (ref 70–99)
Glucose-Capillary: 221 mg/dL — ABNORMAL HIGH (ref 70–99)
Glucose-Capillary: 290 mg/dL — ABNORMAL HIGH (ref 70–99)
Glucose-Capillary: 262 mg/dL — ABNORMAL HIGH (ref 70–99)

## 2023-04-06 LAB — PROTIME-INR
INR: 2.6 — ABNORMAL HIGH (ref 0.8–1.2)
Prothrombin Time: 27.9 seconds — ABNORMAL HIGH (ref 11.4–15.2)

## 2023-04-06 MED ORDER — WARFARIN SODIUM 4 MG PO TABS
4.0000 mg | ORAL_TABLET | Freq: Once | ORAL | Status: AC
  Administered 2023-04-06: 4 mg via ORAL
  Filled 2023-04-06: qty 1

## 2023-04-06 MED ORDER — NEPRO/CARBSTEADY PO LIQD
237.0000 mL | Freq: Two times a day (BID) | ORAL | Status: DC
  Administered 2023-04-07 – 2023-04-08 (×4): 237 mL via ORAL

## 2023-04-06 NOTE — Progress Notes (Signed)
Patient refused hemodialysis today. Hemo nurse called at 1840 to get report and patient did not want to go this late. Patient would like to reschedule for tomorrow after therapy. Patient educated on importance of staying on schedule with dialysis and expressed understanding.    Colleen [name]

## 2023-04-06 NOTE — Discharge Instructions (Signed)
Inpatient Rehab Discharge Instructions  Colleen [name] Discharge date and time: 04/11/23   Activities/Precautions/ Functional Status: Activity: no lifting, driving, or strenuous exercise till cleared by MD. Continue sternal precautions--no pushing, pulling or putting more than 5 lbs thru your arms. Diet: diabetic diet and renal diet Limit fluids to 5 cups daily Wound Care: keep wound clean and dry   Functional status:  ___ No restrictions     ___ Walk up steps independently ___ 24/7 supervision/assistance   ___ Walk up steps with assistance ___ Intermittent supervision/assistance  ___ Bathe/dress independently ___ Walk with walker     ___ Bathe/dress with assistance ___ Walk Independently    ___ Shower independently ___ Walk with assistance    ___ Shower with assistance ___ No alcohol     ___ Return to work/school ________  Special Instructions:  Check blood sugars before meals and at bedtime.  2. YOU HAVE AN APPOINTMENT ON TUESDAY MAY 7TH AT NOON FOR COUMADIN DRAW. YOU HAVE TO KEEP THIS APPOINTMENT AS THEY WILL ADJUST YOUR COUMADIN.   My questions have been answered and I understand these instructions. I will adhere to these goals and the provided educational materials after my discharge from the hospital.  Patient/Caregiver Signature _______________________________ Date __________  Clinician Signature _______________________________________ Date __________  Please bring this form and your medication list with you to all your follow-up doctor's appointments.

## 2023-04-06 NOTE — Progress Notes (Signed)
Occupational Therapy Session Note  Patient Details  Name: Colleen [name] MRN: [id number] Date of Birth: 04-Jan-1961  Today's Date: 04/06/2023 OT Individual Time: 0900-1011 OT Individual Time Calculation (min): 71 min    Short Term Goals: Week 2:  OT Short Term Goal 1 (Week 2): STG = LTG due to ELOS  Skilled Therapeutic Interventions/Progress Updates:  Skilled OT intervention completed with focus on core strengthening, BUE AROM/proximal endurance, fine/gross motor hand coordination. Pt received seated in w/c. No pain reported.  Pt declined self-care needs. Reviewed family ed tomorrow, with plan to demo shower transfer in prep for ADLs at DC, however pt stated "Why are you so fixated on bathing? I can take care of myself!" Therapist re-educated pt on OT purpose/role in IPR as well as preparing pt for DC as she currently requires assist for ALL ADLs and is far from independent level. Pt refused to go to tub room to practice, with request to eat breakfast as it was not delivered earlier.   Checked in on breakfast status, with report of it being on the way. Pt agreeable to complete exercises in room until delivered. Seated in w/c, pt completed 2x15 modified crunches with 2 pound med ball. Breakfast then delivered, with min A needed to prep items due to poor fine motor coordination. OT assembled HEP for BUE AROM/fine & gross motor coordination, and sternal precaution guide in prep for DC.   Reviewed the HEP's during meal consumption, with pt then participating in the following after her meal: -AROM (x10)- single arm chest press at 90 degrees, maintaining shoulders at 90 degrees during elbow flexion, hands on hips (not past waist) shoulder abduction to 90 degrees -Various gross grasping/fine motor pinching with theraputty (tan/light level) -Discussed sternal precautions with handout with "tube method" demonstrated. Discussed how some education/the handout may be helpful to her caregivers even if she  "knows everything" per response from pt  Pt requested renal drink, with nurse approval, OT provided. Pt remained seated in w/c, with all needs in reach at end of session.   Therapy Documentation Precautions:  Precautions Precautions: Sternal, Fall, ICD/Pacemaker Precaution Comments: Anxious. Monitor (O2 > 90%), and HR Restrictions Weight Bearing Restrictions: No Other Position/Activity Restrictions: sternal precautions    Therapy/Group: Individual Therapy  Melvyn Novas, MS, OTR/L  04/06/2023, 12:22 PM

## 2023-04-06 NOTE — Progress Notes (Signed)
PROGRESS NOTE   Subjective/Complaints: No new complaints this morning Had a good weekend Ambulated from bed to gym! CBGs up to 200s: decrease Nepro to BID BM   ROS:   Pt denies SOB, abd pain, CP, N/V/C/D, and vision changes  Objective:   No results found. Recent Labs    04/06/23 0547  WBC 9.0  HGB 10.6*  HCT 33.6*  PLT 170   No results for input(s): "NA", "K", "CL", "CO2", "GLUCOSE", "BUN", "CREATININE", "CALCIUM" in the last 72 hours.   Intake/Output Summary (Last 24 hours) at 04/06/2023 1301 Last data filed at 04/06/2023 1242 Gross per 24 hour  Intake 980 ml  Output --  Net 980 ml     Pressure Injury 03/24/23 Buttocks Left Stage 2 -  Partial thickness loss of dermis presenting as a shallow open injury with a red, pink wound bed without slough. (Active)  03/24/23 1500  Location: Buttocks  Location Orientation: Left  Staging: Stage 2 -  Partial thickness loss of dermis presenting as a shallow open injury with a red, pink wound bed without slough.  Wound Description (Comments):   Present on Admission: Yes    Physical Exam: Vital Signs Blood pressure (!) 91/50, pulse 90, temperature 97.6 F (36.4 C), temperature source Oral, resp. rate 18, height 5' 3.86" (1.622 m), weight 69.9 kg, last menstrual period 12/08/2013, SpO2 95 %. Gen: no distress, normal appearing, BMI 26.57 HEENT: oral mucosa pink and moist, NCAT Cardio: Reg rate Chest: normal effort, normal rate of breathing Abd: soft, non-distended Ext: no edema Psych: pleasant, normal affect Skin: intact Neurological: Ox3  Musculoskeletal:     Cervical back: Neck supple. No tenderness.     Comments: Ue's 5-/5 B/L HF/KE 3+/5 B/L; DF/PF 4+/5 B/L  Skin:    General: chest wall incisions CDI    Comments: 2-3+ edema BLE- and some wooding in hands and arms- from reduced swelling--stable to improved L buttock pressure ulcer- Stage II- ~1.5 x 0.5 cm- pink-  slightly open Incision closed on R chest and L chest from prior HD catheters Fistula L upper arm- (+) thrill  +LE edema Neurological:     Mental Status: She is alert.     Comments: Right facial weakness without dysarthria. Speech slow but clear. Oriented X 4. Intact to light touch in all 4 extremities per pt  Fully oriented       Assessment/Plan: 1. Functional deficits which require 3+ hours per day of interdisciplinary therapy in a comprehensive inpatient rehab setting. Physiatrist is providing close team supervision and 24 hour management of active medical problems listed below. Physiatrist and rehab team continue to assess barriers to discharge/monitor patient progress toward functional and medical goals  Care Tool:  Bathing    Body parts bathed by patient: Right arm, Left arm, Chest, Abdomen, Face, Right upper leg, Left upper leg   Body parts bathed by helper: Front perineal area, Buttocks, Right lower leg, Left lower leg     Bathing assist Assist Level: Supervision/Verbal cueing     Upper Body Dressing/Undressing Upper body dressing   What is the patient wearing?: Button up shirt    Upper body assist Assist Level: Minimal Assistance -  Patient > 75%    Lower Body Dressing/Undressing Lower body dressing      What is the patient wearing?: Incontinence brief, Pants     Lower body assist Assist for lower body dressing: Moderate Assistance - Patient 50 - 74%     Toileting Toileting    Toileting assist Assist for toileting: Moderate Assistance - Patient 50 - 74%     Transfers Chair/bed transfer  Transfers assist  Chair/bed transfer activity did not occur: Safety/medical concerns (weakness, fear)  Chair/bed transfer assist level: Supervision/Verbal cueing     Locomotion Ambulation   Ambulation assist   Ambulation activity did not occur: Safety/medical concerns (fatigue, fear, generalized weakness)  Assist level: Contact Guard/Touching assist Assistive  device: Walker-rolling Max distance: 61ft   Walk 10 feet activity   Assist  Walk 10 feet activity did not occur: Safety/medical concerns (fatigue, fear, generalized weakness)  Assist level: Contact Guard/Touching assist Assistive device: Walker-rolling   Walk 50 feet activity   Assist Walk 50 feet with 2 turns activity did not occur: Safety/medical concerns (fatigue, fear, generalized weakness)  Assist level: Contact Guard/Touching assist Assistive device: Walker-rolling    Walk 150 feet activity   Assist Walk 150 feet activity did not occur: Safety/medical concerns (fatigue, fear, generalized weakness)         Walk 10 feet on uneven surface  activity   Assist Walk 10 feet on uneven surfaces activity did not occur: Safety/medical concerns (fatigue, fear, generalized weakness)         Wheelchair     Assist Is the patient using a wheelchair?: Yes Type of Wheelchair: Manual Wheelchair activity did not occur: Safety/medical concerns (fatigue, fear, generalized weakness)         Wheelchair 50 feet with 2 turns activity    Assist    Wheelchair 50 feet with 2 turns activity did not occur: Safety/medical concerns (fatigue, fear, generalized weakness)       Wheelchair 150 feet activity     Assist  Wheelchair 150 feet activity did not occur: Safety/medical concerns (fatigue, fear, generalized weakness)       Blood pressure (!) 91/50, pulse 90, temperature 97.6 F (36.4 C), temperature source Oral, resp. rate 18, height 5' 3.86" (1.622 m), weight 69.9 kg, last menstrual period 12/08/2013, SpO2 95 %.    Medical Problem List and Plan: 1. Functional deficits secondary to debility after MV repair with sternal precautions             -patient may  shower if cover incisions- HD catheters in chest are out- also has sternal incisions that need to be covered             -ELOS/Goals: 10-14 days supervision to min A  Con't CIR PT and OT 2.  Mechanical  MVR: INR therapeutic and heparin drip d/ced.  Pharmaceutical: Coumadin  --INR supra-therapeutic this am  4/27- down to 3.0- per pharmacy             -antiplatelet therapy: ASA 3. Pain Management:  Tylenol prn. Lidocaine patches prn. Denies pain 4. Insomnia: increase melatonin to 10mg  HS 4/20  -Charge nurse spoke with her about PM nursing care.  5. Anxiety: neuropsych consulted 6. Skin/Wound Care: Routine pressure relief measures with frequent pressure relief measures for sacral decub.             --Interdry to skin folds.              --Add vitamin C and Zinc to promote wound  healing 7. Fluids/Electrolytes/Nutrition: Strict I/O. Labs with HD.  --Will keep diet as regular to help with food choices             --Juven and nephro added. 8. MVR/TVR by Dr. Felipa Furnace [phone]): Continue Sternal precautions.              --PPM for CHB             --coumadin with INR goal 2.5-3.5 9.  Hypotension: Monitor for symptoms with increase in activity             --on midodrine, Staterra and Droxidopa. Blood pressures reviewed and have improved  4/28- Well controlled- no hypotension-  10.  ESRD: Schedule HD at the end of the day. Renal diet with 1200 cc FR.  11. T2DM: Provided list of foods that are good for diabetes. D/c HS SSI. D/c daytime ISS, d/c breakfast aspart and instead add on semglee HS 6U. Decrease Nepro to BID BM 12. A fib: Monitor HR TID. No BB due to hypotension. HR continues to be elevated, continue to monitor             --on coumadin.  4.27- HR running 80s-90's- con't to monitor trend 13. Anemia of chronic disease: Monitor H/H with HD labs/             --transfuse prn Hgb <7.0 or if symptomatic.  14. Anxiety- pt cannot tolerate door/curtain closed 15. New O2 requirement with mild tachypnea/volume overload -  -volume mgt per nephrology with HD  -continue oxygen as needed--humidfy  4/28- only uses O2 at night- on lap this AM 16. Wound on left elbow: ordered manuka honey daily.  17.  Vitamin D insufficiency: continue ergocalciferol 50,000U once per week for 7 weeks 18. Shortness of breath: improved, reviewed CXR results with her that show pleural effusion and atelectasis, incentive spirometer ordered, educated regarding its benefits  19. Epistaxis left nostril--likely exacerbated by O2 [place] and elevated INR  -Hgb reviewed and trending upward  -4mg  coumadin today  -will schedule afrin  -resolved  -humidified O2  -continue ocean nasal spray as possible  20. Tachycardia: continue to monitor HR TID  21. Overweight: provide dietary education, weight decreasing, transition from more short acting to long acting insulin as tolerated, BMI reviewed and is currently 26.57    LOS: 13 days A FACE TO FACE EVALUATION WAS PERFORMED  Kirstine Jacquin P Aliha Diedrich 04/06/2023, 1:01 PM

## 2023-04-06 NOTE — Progress Notes (Signed)
[facility] Progress Note   Subjective:   Reports ongoing nonproductive cough and edema. Denies SOB, orthopnea. No CP, palpitations or dizziness. Will have HD later today.   Objective Vitals:   04/05/23 0449 04/05/23 1316 04/05/23 1930 04/06/23 0553  BP: 118/62 (!) 105/49 (!) 104/57 (!) 93/53  Pulse: 90 92 91 91  Resp: 16 15 16 16   Temp: 98 F (36.7 C) 98.2 F (36.8 C) (!) 97.1 F (36.2 C) 98.1 F (36.7 C)  TempSrc:   Oral   SpO2: 100% 100% 100% 100%  Weight: 69.9 kg     Height:       Physical Exam General: Alert female in NAD, + R sided facial weakness Heart: RRR, no murmurs, midsternal scar Lungs: + rales bilateral lower lobes. Respirations unlabored on RA Abdomen: Soft, non-distended Extremities: 1-2+ LE edema, wearing compression hose Dialysis Access:  LUE AVF + bruit  Additional Objective Labs: Basic Metabolic Panel: Recent Labs  Lab 04/01/23 1400 04/03/23 1300  NA 135 132*  K 3.0* 4.3  CL 94* 91*  CO2 27 22  GLUCOSE 83 216*  BUN 12 36*  CREATININE 2.55* 6.86*  CALCIUM 7.7* 7.8*  PHOS 1.6* 5.2*   Liver Function Tests: Recent Labs  Lab 04/01/23 1400 04/03/23 1300  ALBUMIN 2.5* 2.4*   No results for input(s): "LIPASE", "AMYLASE" in the last 168 hours. CBC: Recent Labs  Lab 04/01/23 1400 04/03/23 1300 04/06/23 0547  WBC 9.7 11.2* 9.0  HGB 10.6* 10.4* 10.6*  HCT 34.2* 32.6* 33.6*  MCV 90.2 88.8 89.6  PLT 273 233 170   Blood Culture    Component Value Date/Time   SDES BLOOD RIGHT HAND 12/11/2020 1151   SPECREQUEST  12/11/2020 1151    BOTTLES DRAWN AEROBIC AND ANAEROBIC Blood Culture adequate volume   CULT  12/11/2020 1151    NO GROWTH 5 DAYS Performed at [facility], [address], [place], [place] [address]    REPTSTATUS 12/16/2020 FINAL 12/11/2020 1151    Cardiac Enzymes: No results for input(s): "CKTOTAL", "CKMB", "CKMBINDEX", "TROPONINI" in the last 168 hours. CBG: Recent Labs  Lab 04/05/23 0611  04/05/23 1117 04/05/23 1626 04/05/23 2030 04/06/23 0600  GLUCAP 151* 232* 247* 116* 133*   Iron Studies: No results for input(s): "IRON", "TIBC", "TRANSFERRIN", "FERRITIN" in the last 72 hours. @lablastinr3 @ Studies/Results: No results found. Medications:   amiodarone  200 mg Oral Daily   aspirin EC  81 mg Oral Daily   [START ON 04/07/2023] atomoxetine  18 mg Oral Daily   Chlorhexidine Gluconate Cloth  6 each Topical Q0600   Chlorhexidine Gluconate Cloth  6 each Topical Q0600   Chlorhexidine Gluconate Cloth  6 each Topical Q0600   Chlorhexidine Gluconate Cloth  6 each Topical Q0600   cinacalcet  60 mg Oral Q M,W,F   darbepoetin (ARANESP) injection - DIALYSIS  60 mcg Subcutaneous Q Fri-1800   doxercalciferol  3 mcg Intravenous Q M,W,F-HD   droxidopa  500 mg Oral TID WC   famotidine  10 mg Oral BID   feeding supplement (NEPRO CARB STEADY)  237 mL Oral TID WC   insulin aspart  5 Units Subcutaneous BID AC   insulin glargine-yfgn  6 Units Subcutaneous QHS   leptospermum manuka honey  1 Application Topical Daily   melatonin  10 mg Oral QHS   midodrine  20 mg Oral TID WC   multivitamin  1 tablet Oral QHS   nutrition supplement (JUVEN)  1 packet Oral BID BM  polycarbophil  625 mg Oral Daily   simethicone  80 mg Oral QID   sodium chloride  1 spray Each Nare TID PC   Vitamin D (Ergocalciferol)  50,000 Units Oral Q7 days   warfarin  4 mg Oral ONCE-1600   Warfarin - Pharmacist Dosing Inpatient   Does not apply q1600    Dialysis Orders: MWF SW 3h  400/1.5  69.5kg 2K/2Ca bath  Heparin 3000  AVF LUA - last OP HD 3/13, post wt 69.5kg - sensipar 60mg  po tiw - hectorol 3 mcg IV tiw - venofer 50 mg IV weekly   61yo F s/p [facility] admit 3/13-4/16/24 for MV replacement, TV ring, AV decalcification with removal of LV mass. Post-op, she have A-fib and CHB, underwent leadless PPM on 02/26/23. Hospitalization c/b hypotension, on midodrine + Strattera + Droxidopa. Will be on warfarin + ASA for  coagulation. Transferred to CIR for rehab.     Assessment/Plan: 1. Valvular heart disease s/p MVR, TV ring, AoV decalcification with removal LV mass 02/18/23: On warfarin for anticoagulation. 2. ESRD: Will continue outpatient HD schedule MWF-  She refuses to run > 3hr treatment, had been refusing extra HD for volume removal- but then was agreeable to an extra treatment but unfortunately this was unable to be done last week due to high dialysis census. Routine HD today with UF as tolerated, will assess tomorrow to see if extra HD is needed.  3. HYPOTN/volume: On midodrine 20mg  TID, droxidopa 500mg  TID, and atomoxetine 18mg  daily for hypotension. She is very edematous - will see what we can get off with dialysis, using low temp and albumin. 4. Anemia of ESRD: Hgb 10.6-  last Aranesp on 4/19. 5. Secondary hyperparathyroidism:  Corr/Phos at goal, on cinacalcet 60mg  TIW and hectorol. Hold binders for now since phos is reasonable -  think labs drawn on 4/24 pre HD are diluted or drawn later in HD  6. Nutrition:  On protein supplements 7. T2DM: On insulin, per primary. 8. A-fib: On amiodarone. 9. Debility: In CIR - follow progress.  She says she will be discharged on 5/4  Rogers Blocker, PA-C 04/06/2023, 11:07 AM  [facility] Pager: [phone]

## 2023-04-06 NOTE — Progress Notes (Signed)
ANTICOAGULATION CONSULT NOTE   Pharmacy Consult for Warfarin , INR goal 2.5-3.5  Indication: Mechanical MVR  Allergies  Allergen Reactions   Farxiga [Dapagliflozin] Other (See Comments)    Cannot take due to kidneys   Hydrocodone Other (See Comments)    "Increase Glucose level," per pt   Kombiglyze Xr [Saxagliptin-Metformin Er] Other (See Comments)    Cannot take due to kidneys    Patient Measurements: Height: 5' 3.86" (162.2 cm) (from [facility] records on 02/18/23) Weight: 69.9 kg (154 lb 1.6 oz) IBW/kg (Calculated) : 54.37 Heparin Dosing Weight: 70.1 kg   Vital Signs: Temp: 98.1 F (36.7 C) (04/29 0553) BP: 93/53 (04/29 0553) Pulse Rate: 91 (04/29 0553)  Labs: Recent Labs    04/03/23 1300 04/04/23 0530 04/05/23 0816 04/06/23 0547  HGB 10.4*  --   --  10.6*  HCT 32.6*  --   --  33.6*  PLT 233  --   --  170  LABPROT  --  30.9* 29.3* 27.9*  INR  --  3.0* 2.8* 2.6*  CREATININE 6.86*  --   --   --      Estimated Creatinine Clearance: 8.3 mL/min (A) (by C-G formula based on SCr of 6.86 mg/dL (H)).   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 Developed CHB and Afib post-op, 3/21 underwent leadless PPM placement. Patient has a history of DVT/PE. 2016, 2019)  No longer on [facility] prior to [facility]  admission. Has been off Eliquis for years. [facility] started her on Warfarin ~ 3/16. Pharmacy consulted for warfarin dosing.   Of note: Amiodarone started ~3/21 w/load, 4/10 started Amio 200mg  QD. Anticipate amiodarone effect on warfarin dosing should be stable at this time. Patient with mild epistaxis 4/19-4/22, likely related to nasal cannula, now resolved. Note patient with poor PO intake ranging 0-65% of meals.   INR 2.6 today    Goal of Therapy:  INR 2.5-3.5 (mechanical MVR) Monitor platelets per anticoagulation protocol: Yes   Plan:  Warfarin 4 mg PO x1 Monitor daily INR until stable then decrease frequency, weekly CBC   Thank you Okey Regal, PharmD  Please check AMION for all [facility] Pharmacy phone numbers After 10:00 PM, call Main Pharmacy [phone]

## 2023-04-06 NOTE — Progress Notes (Signed)
Physical Therapy Session Note  Patient Details  Name: Colleen [name] MRN: [id number] Date of Birth: 04-22-1961  Today's Date: 04/06/2023 PT Individual Time: [phone] and [id number] PT Individual Time Calculation (min): 56 min and 69 min  Short Term Goals: Week 1:  PT Short Term Goal 1 (Week 1): pt will transfer sit<>stand with LRAD and min A PT Short Term Goal 1 - Progress (Week 1): Progressing toward goal PT Short Term Goal 2 (Week 1): pt will transfer bed<>chair with LRAD and min A PT Short Term Goal 2 - Progress (Week 1): Met PT Short Term Goal 3 (Week 1): pt will ambulate 64ft with LRAD and min A PT Short Term Goal 3 - Progress (Week 1): Met Week 2:  PT Short Term Goal 1 (Week 2): STG=LTG due to LOS  Skilled Therapeutic Interventions/Progress Updates:   Treatment Session 1 Received pt sitting on commode with NT present - PT took over with care. Pt agreeable to PT treatment and denied any pain during session. Session with emphasis on toileting, functional mobility/transfers, dressing, generalized strengthening and endurance, and gait training. Pt ultimately unsuccessful on commode - stood with close supervision using grab bar and dependent to wipe. Pt ambulated 30ft out of bathroom with RW and CGA and sat in WC at sink and brushed teeth/washed face with set up assist. Donned bilateral ted hose and shoes and non-skid socks with total A for edema management. Pt has tennis shoes but feet are currently too swollen to fit. Doffed gown and donned button up shirt with min A to get RUE through. Donned brief and pants sitting in WC with max A for time management purposes and required x 2 attempts and light mod A to stand with RW - total A to pull brief/pants over hips. Pt transferred WC<>bed stand<>pivot with RW and CGA (per request) and sat EOB to take medications from RN.  Pt then stood from elevated EOB with RW and supervision and ambulated 51ft with RW and close supervision to dayroom - pt with  2 episodes of generalized LOB when turning, requiring CGA/light min A to correct. Transported back to room in [facility] dependently and pt requested to work on seated vs standing exercises. Pt performed the following exercises with emphasis on LE strength/ROM: hip abduction 2x12 with grn TB, hip flexion 2x12 bilaterally with grn TB, LAQ 2x12 bilaterally with grn TB, and heel/toes raises 2x20 bilaterally. Concluded session with pt sitting in [facility] with all needs within reach and BLEs elevated for edema management. NT present at bedside.   Treatment Session 2 Received pt sitting in WC, pt agreeable to PT treatment, and denied any pain during session. Session with emphasis on functional mobility/transfers, dressing, generalized strengthening and endurance, and simulated car transfers. Pt transported to/from room in [facility] dependently for time management purposes. Pt performed simulated car transfer with RW and mod A overall. Pt declined ambulating to car, requesting therapist get her as close as possible to the car. Stood from [facility] with RW and min A and transferred to car with RW and CGA. Pt unwilling to try to get her legs into car independently, ultimately requiring mod/max A, but able to get LEs out of car without assist - pt reports she will have someone to help her manage her legs upon discharge. Pt then reported pain/"tightness" in neck after car transfer and performed the following stretches/exercises: -upper trapezius stretch 3x20 second hold bilaterally -levator scapulae stretch 3x20 second hold bilaterally -posterior shoulder rolls 2x20 -seated hamstring stretch  3x20 second hold bilaterally   Provided pt with HEP and educated on frequency/duration/technique for the following exercises: - Seated Upper Trapezius Stretch  - 1 x daily - 7 x weekly - 1 sets - 3 reps - 20 hold - Gentle Levator Scapulae Stretch  - 1 x daily - 7 x weekly - 1 sets - 3 reps - 20 hold - Shoulder Rolls in Sitting  - 1 x daily - 7 x weekly -  2 sets - 20 reps - Seated Hip Abduction with Resistance  - 1 x daily - 7 x weekly - 3 sets - 12 reps - Seated Knee Lifts with Resistance  - 1 x daily - 7 x weekly - 3 sets - 12 reps - Seated Knee Extension with Resistance  - 1 x daily - 7 x weekly - 3 sets - 12 reps - Seated Heel Toe Raises  - 1 x daily - 7 x weekly - 3 sets - 20 reps - Seated Hip Adduction Isometrics with Ball  - 1 x daily - 7 x weekly - 3 sets - 10 reps - 5 hold - Seated Hamstring Stretch  - 1 x daily - 7 x weekly - 1 sets - 3 reps - 30 hold Pt requested to return to room and get undressed for dialysis. Stood from [facility] with RW and min A and transferred WC<>bed stand<>pivot with RW and CGA. Doffed pants/brief in standing with max A. Sat EOB and removed ted hose and non-skid socks dependently and removed button up shirt with min A. Pt insisting on therapist's assist for LE management when transferring into supine - ultimately requiring max A. Scooted to [facility] with max A and pt pushing with BLE in hooklying. Concluded session with pt semi-reclined in bed, needs within reach, and bed alarm on.   Therapy Documentation Precautions:  Precautions Precautions: Sternal, Fall, ICD/Pacemaker Precaution Comments: Anxious. Monitor (O2 > 90%), and HR Restrictions Weight Bearing Restrictions: No Other Position/Activity Restrictions: sternal precautions  Therapy/Group: Individual Therapy Martin Majestic PT, DPT  04/06/2023, 6:56 AM

## 2023-04-06 NOTE — Progress Notes (Signed)
Patient ID: Colleen [name], female   DOB: [birth year], 62 y.o.   MRN: [id number]  Hospital Bed ordered through [employer].

## 2023-04-06 NOTE — TOC Benefit Eligibility Note (Signed)
Patient Product/process development scientist completed.    The patient is currently admitted and upon discharge could be taking dorxidopa 100 mg capsules.  Prior Authorization Required  The patient is insured through [employer]   This test claim was processed through [employer]- copay amounts may vary at other pharmacies due to [employer], or as the patient moves through the different stages of their insurance plan.  Roland Earl, CPHT Pharmacy Patient Advocate Specialist  [facility] Patient Advocate Team Direct Number: [phone]  Fax: [phone]

## 2023-04-07 ENCOUNTER — Encounter (HOSPITAL_COMMUNITY): Payer: Self-pay

## 2023-04-07 ENCOUNTER — Other Ambulatory Visit (HOSPITAL_COMMUNITY): Payer: Self-pay

## 2023-04-07 ENCOUNTER — Telehealth (HOSPITAL_COMMUNITY): Payer: Self-pay | Admitting: Pharmacy Technician

## 2023-04-07 DIAGNOSIS — R5381 Other malaise: Secondary | ICD-10-CM | POA: Diagnosis not present

## 2023-04-07 LAB — RENAL FUNCTION PANEL
Albumin: 2.4 g/dL — ABNORMAL LOW (ref 3.5–5.0)
Anion gap: 19 — ABNORMAL HIGH (ref 5–15)
BUN: 52 mg/dL — ABNORMAL HIGH (ref 6–20)
CO2: 23 mmol/L (ref 22–32)
Calcium: 8 mg/dL — ABNORMAL LOW (ref 8.9–10.3)
Chloride: 90 mmol/L — ABNORMAL LOW (ref 98–111)
Creatinine, Ser: 8.79 mg/dL — ABNORMAL HIGH (ref 0.44–1.00)
GFR, Estimated: 5 mL/min — ABNORMAL LOW (ref >60)
Glucose, Bld: 266 mg/dL — ABNORMAL HIGH (ref 70–99)
Phosphorus: 5.4 mg/dL — ABNORMAL HIGH (ref 2.5–4.6)
Potassium: 3.6 mmol/L (ref 3.5–5.1)
Sodium: 132 mmol/L — ABNORMAL LOW (ref 135–145)

## 2023-04-07 LAB — CBC
HCT: 32.1 % — ABNORMAL LOW (ref 36.0–46.0)
Hemoglobin: 10.1 g/dL — ABNORMAL LOW (ref 12.0–15.0)
MCH: 27.7 pg (ref 26.0–34.0)
MCHC: 31.5 g/dL (ref 30.0–36.0)
MCV: 88.2 fL (ref 80.0–100.0)
Platelets: 173 10*3/uL (ref 150–400)
RBC: 3.64 MIL/uL — ABNORMAL LOW (ref 3.87–5.11)
RDW: 19.5 % — ABNORMAL HIGH (ref 11.5–15.5)
WBC: 9.6 10*3/uL (ref 4.0–10.5)
nRBC: 2.1 % — ABNORMAL HIGH (ref 0.0–0.2)

## 2023-04-07 LAB — GLUCOSE, CAPILLARY
Glucose-Capillary: 259 mg/dL — ABNORMAL HIGH (ref 70–99)
Glucose-Capillary: 313 mg/dL — ABNORMAL HIGH (ref 70–99)
Glucose-Capillary: 214 mg/dL — ABNORMAL HIGH (ref 70–99)

## 2023-04-07 LAB — PROTIME-INR
INR: 2.7 — ABNORMAL HIGH (ref 0.8–1.2)
Prothrombin Time: 28.8 seconds — ABNORMAL HIGH (ref 11.4–15.2)

## 2023-04-07 MED ORDER — HEPARIN SODIUM (PORCINE) 1000 UNIT/ML DIALYSIS: 20.0000 [IU]/kg | INTRAMUSCULAR | Status: DC | PRN

## 2023-04-07 MED ORDER — WARFARIN SODIUM 4 MG PO TABS
4.0000 mg | ORAL_TABLET | Freq: Once | ORAL | Status: AC
  Administered 2023-04-07: 4 mg via ORAL
  Filled 2023-04-07: qty 1

## 2023-04-07 MED ORDER — SALINE SPRAY 0.65 % NA SOLN
1.0000 | NASAL | Status: DC | PRN
  Filled 2023-04-07: qty 44

## 2023-04-07 MED ORDER — CHLORHEXIDINE GLUCONATE CLOTH 2 % EX PADS: 6.0000 | MEDICATED_PAD | Freq: Every day | CUTANEOUS | Status: DC

## 2023-04-07 MED ORDER — DROXIDOPA 100 MG PO CAPS: 200.0000 mg | ORAL_CAPSULE | Freq: Three times a day (TID) | ORAL | Status: DC

## 2023-04-07 MED ORDER — INSULIN GLARGINE-YFGN 100 UNIT/ML ~~LOC~~ SOLN
7.0000 [IU] | Freq: Every day | SUBCUTANEOUS | Status: DC
  Administered 2023-04-07: 7 [IU] via SUBCUTANEOUS
  Filled 2023-04-07 (×2): qty 0.07

## 2023-04-07 MED ORDER — ALBUMIN HUMAN 25 % IV SOLN
25.0000 g | Freq: Once | INTRAVENOUS | Status: AC
  Administered 2023-04-07: 25 g via INTRAVENOUS

## 2023-04-07 NOTE — Progress Notes (Signed)
Patient ID: Colleen [name], female   DOB: Jun 26, 1961, 62 y.o.   MRN: [id number]  Patient d/c updated to Thursday 5/2 due to self-limiting in sessions, verbally abusive to staff, limited progress and refusing family education. SW informed patient and mother. No additional questions or concerns. Patient excited to go home. SW will inform [employer] and HH of pt sooner d/c.

## 2023-04-07 NOTE — Progress Notes (Signed)
[facility] Progress Note   Subjective:   Refused HD last night- was too late. Rescheduled for today. She reports she will only stay for 2 hours. Attempted to educate that this is a make up treatment rather than an extra treatment, and longer HD is needed to facilitate volume removal. She is not receptive to this and states she will stay for 2 hours or not come to dialysis at all.  Reports her cough is improved today. Denies SOB. No CP, palpitations, dizziness, or nausea. Ongoing LE edema.   Objective Vitals:   04/06/23 0553 04/06/23 1249 04/06/23 1523 04/07/23 0351  BP: (!) 93/53 (!) 91/50 121/60 100/74  Pulse: 91 90 89 93  Resp: 16 18  18   Temp: 98.1 F (36.7 C) 97.6 F (36.4 C)  (!) 97.4 F (36.3 C)  TempSrc:  Oral  Axillary  SpO2: 100% 95%  100%  Weight:      Height:       Physical Exam General: Alert female in NAD, + R sided facial weakness Heart: RRR, no murmurs, midsternal scar Lungs: Lungs CTA bilaterally Respirations unlabored on RA Abdomen: Soft, non-distended Extremities: 2+ LE edema, wearing compression hose Dialysis Access:  LUE AVF + bruit     Additional Objective Labs: Basic Metabolic Panel: Recent Labs  Lab 04/01/23 1400 04/03/23 1300  NA 135 132*  K 3.0* 4.3  CL 94* 91*  CO2 27 22  GLUCOSE 83 216*  BUN 12 36*  CREATININE 2.55* 6.86*  CALCIUM 7.7* 7.8*  PHOS 1.6* 5.2*   Liver Function Tests: Recent Labs  Lab 04/01/23 1400 04/03/23 1300  ALBUMIN 2.5* 2.4*   No results for input(s): "LIPASE", "AMYLASE" in the last 168 hours. CBC: Recent Labs  Lab 04/01/23 1400 04/03/23 1300 04/06/23 0547  WBC 9.7 11.2* 9.0  HGB 10.6* 10.4* 10.6*  HCT 34.2* 32.6* 33.6*  MCV 90.2 88.8 89.6  PLT 273 233 170   Blood Culture    Component Value Date/Time   SDES BLOOD RIGHT HAND 12/11/2020 1151   SPECREQUEST  12/11/2020 1151    BOTTLES DRAWN AEROBIC AND ANAEROBIC Blood Culture adequate volume   CULT  12/11/2020 1151    NO GROWTH 5  DAYS Performed at [facility], [address] [place], [place] [address]    REPTSTATUS 12/16/2020 FINAL 12/11/2020 1151    CBG: Recent Labs  Lab 04/06/23 0600 04/06/23 1203 04/06/23 1626 04/06/23 2013 04/07/23 0614  GLUCAP 133* 221* 290* 262* 259*    Medications:   amiodarone  200 mg Oral Daily   aspirin EC  81 mg Oral Daily   atomoxetine  18 mg Oral Daily   Chlorhexidine Gluconate Cloth  6 each Topical Q0600   Chlorhexidine Gluconate Cloth  6 each Topical Q0600   Chlorhexidine Gluconate Cloth  6 each Topical Q0600   Chlorhexidine Gluconate Cloth  6 each Topical Q0600   cinacalcet  60 mg Oral Q M,W,F   darbepoetin (ARANESP) injection - DIALYSIS  60 mcg Subcutaneous Q Fri-1800   doxercalciferol  3 mcg Intravenous Q M,W,F-HD   droxidopa  500 mg Oral TID WC   famotidine  10 mg Oral BID   feeding supplement (NEPRO CARB STEADY)  237 mL Oral BID BM   insulin aspart  5 Units Subcutaneous BID AC   insulin glargine-yfgn  7 Units Subcutaneous QHS   leptospermum manuka honey  1 Application Topical Daily   melatonin  10 mg Oral QHS   midodrine  20 mg  Oral TID WC   multivitamin  1 tablet Oral QHS   nutrition supplement (JUVEN)  1 packet Oral BID BM   polycarbophil  625 mg Oral Daily   simethicone  80 mg Oral QID   sodium chloride  1 spray Each Nare TID PC   Vitamin D (Ergocalciferol)  50,000 Units Oral Q7 days   Warfarin - Pharmacist Dosing Inpatient   Does not apply q1600    Outpatient Dialysis Orders: MWF SW 3h  400/1.5  69.5kg 2K/2Ca bath  Heparin 3000  AVF LUA - last OP HD 3/13, post wt 69.5kg - sensipar 60mg  po tiw - hectorol 3 mcg IV tiw - venofer 50 mg IV weekly   61yo F s/p [facility] admit 3/13-4/16/24 for MV replacement, TV ring, AV decalcification with removal of LV mass. Post-op, she have A-fib and CHB, underwent leadless PPM on 02/26/23. Hospitalization c/b hypotension, on midodrine + Strattera + Droxidopa. Will be on warfarin + ASA for coagulation. Transferred  to CIR for rehab.   Assessment/Plan: 1. Valvular heart disease s/p MVR, TV ring, AoV decalcification with removal LV mass 02/18/23: On warfarin for anticoagulation. 2. ESRD: Will continue outpatient HD schedule MWF-  She refuses to run > 3hr treatment, had been refusing extra HD for volume removal- but then was agreeable to an extra treatment but unfortunately this was unable to be done last week due to high dialysis census. Refused HD yesterday, was too late in the day for her. HD today (states she will only stay for 2 hours), resume MWF schedule tomorrow.  3. HYPOTN/volume: On midodrine 20mg  TID, droxidopa 500mg  TID, and atomoxetine 18mg  daily for hypotension. She is very edematous - will see what we can get off with dialysis, using low temp and albumin. 4. Anemia of ESRD: Hgb 10.6-  last Aranesp on 4/19. 5. Secondary hyperparathyroidism:  Corr/Phos at goal, on cinacalcet 60mg  TIW and hectorol. Hold binders for now since phos is reasonable -  think labs drawn on 4/24 pre HD are diluted or drawn later in HD  6. Nutrition:  On protein supplements 7. T2DM: On insulin, per primary. 8. A-fib: On amiodarone. 9. Debility: In CIR - follow progress.  She says she will be discharged on 5/4  Rogers Blocker, Cordelia Poche 04/07/2023, 9:40 AM  [facility] Pager: [phone]

## 2023-04-07 NOTE — Progress Notes (Signed)
Physical Therapy Session Note  Patient Details  Name: Colleen [name] MRN: [id number] Date of Birth: March 14, 1961  Today's Date: 04/07/2023 PT Individual Time: 1030-1126 PT Individual Time Calculation (min): 56 min   Short Term Goals: Week 1:  PT Short Term Goal 1 (Week 1): pt will transfer sit<>stand with LRAD and min A PT Short Term Goal 1 - Progress (Week 1): Progressing toward goal PT Short Term Goal 2 (Week 1): pt will transfer bed<>chair with LRAD and min A PT Short Term Goal 2 - Progress (Week 1): Met PT Short Term Goal 3 (Week 1): pt will ambulate 73ft with LRAD and min A PT Short Term Goal 3 - Progress (Week 1): Met Week 2:  PT Short Term Goal 1 (Week 2): STG=LTG due to LOS  Skilled Therapeutic Interventions/Progress Updates:   Received pt sitting in [facility] with mother present for family education training, but mother is disabled and will not be primary caregiver. Pt's primary caregiver, Colleen [name], not present. Recommended allowing CSW to call Colleen [name] to set up caregiver training for another day this week, however pt refused stating therapist could call Colleen [name] on the phone to talk to her. Discussed that PT could call Colleen [name], but that she also needs to be present to practice hands on education, since pt currently requires hands on assist with functional mobility. Dialysis PA present, recommending pt go to dialysis since pt missed session yesterday - pt adamantly refusing and getting agitated, arguing with PA.   Pt transported to/from room in [facility] dependently for time management purposes. Pt requested to sit and rest for 5 minutes to gather herself before walking. Upon getting ready to stand, pt requested therapist "don't touch her" when standing, but ultimately unable to stand from [facility] without assist. On trial 2, required mod A to stand from [facility] (insisting therapist push up from her bottom rather than assisting under her armpit, however for the past week pt has preferred assist under armpit). Pt  ambulated 59ft with RW and CGA with 1 instance of LOB requiring min A to correct. Discussed pt's current need for assist to rise into standing and importance of relaying this technique to Colleen [name]. Verbally educated pt's mother on technique for donning/doffing legrests, however pt's mother unable to assist with WC parts management at D/C. Pt declined any further ambulation and requested to return to room and perform seated exercises. Pt performed the following exercises with emphasis on LE strength/ROM: -hip abduction with grn TB 2x12 -hip flexion with grn TB 2x12 bilaterally -knee extension with grn TB 2x12 bilaterally -heel/toe raises 2x20 bilaterally NT arrived to check blood glucose (313) - RN notified. Concluded session with pt sitting in [facility] with all needs within reach. Elevated BLEs on elevating legrests for edema management.   Therapy Documentation Precautions:  Precautions Precautions: Sternal, Fall, ICD/Pacemaker Precaution Comments: Anxious. Monitor (O2 > 90%), and HR Restrictions Weight Bearing Restrictions: No Other Position/Activity Restrictions: sternal precautions  Therapy/Group: Individual Therapy Martin Majestic PT, DPT  04/07/2023, 6:59 AM

## 2023-04-07 NOTE — Progress Notes (Signed)
Occupational Therapy Session Note  Patient Details  Name: Colleen [name] MRN: [id number] Date of Birth: 1961-06-23  Today's Date: 04/07/2023 OT Individual Time: 0900-1000 & 1478-2956 OT Individual Time Calculation (min): 60 min & 70 min   Short Term Goals: Week 2:  OT Short Term Goal 1 (Week 2): STG = LTG due to ELOS  Skilled Therapeutic Interventions/Progress Updates:  Session 1 Skilled OT intervention completed with focus on family education with pt's mother present regarding ADL and functional transfer recommendations. Pt received upright in bed, agreeable to session. No pain reported.  Pt's mother present for family ed, however when OT tried to confirm DC plans as last time the plans were discussed pt was returning home with a "friend" named Elnita Maxwell and her boyfriend(?), pt snapped at OT yelling "you have such a small brain! You don't remember nothing." Advised that pt is DC by end of week and confirmed details are essential along with the purpose of arranging this current education session to teach those who will have the responsibility of caring for her. When OT approached the "friend named Elnita Maxwell" coming in for education to ensure safe transition home, pt became increasingly short tempered and frustrated. CSW notified of current situation as education is not helpful if her disabled mother is only one present who will not be helping pt at home. This therapist's current recommendation is to move up pt's DC as she is no longer receptive to education or skilled intervention, or if family feels unprepared to DC to the SNF setting for additional care/therapy as pt requires up to max A for ADLs due to self-limiting behaviors and poor awareness, and no caregivers present for training.   Pt transitioned to EOB with supervision with HOB elevated and heavy use of bed rails. O2 sats WNL however pt with [place] on 2L for "support at night" but pt agreeable to doff during day. Educated pt that she can't go  home with this now that she is tolerating RA however pt stated "I will be going home with it." Care team notified as current plan is not to DC with O2.  Donned button down with min A, pre-looped brief with max A needed to thread feet along with pants and total A donning of socks/TEDs. Sit > stand with CGA using RW though heavy push on RW outside of her sternal precautions but pt continues to do things her way despite cues. Pt assisted with donning brief over hips, requiring min A, then urgent report for void/BM stating incontinence. Offered BSC for trial at toileting with pt agreeable. Stand pivot with CGA to BSC. Pt was continent of BM with time. CGA sit > stand using RW, then total A for wiping due to pt refusal to do herself even with only 1 hand to stay within sternal precautions. Mod A needed to donn pants over hips. CGA stand pivot with RW to w/c.  Pt's mother did not ask questions and had no concerns when offered. Pt remained seated in w/c with BLE elevated, with all needs in reach at end of session.  Session 2 Skilled OT intervention completed with focus on standing and activity tolerance, dynamic balance. Pt received seated in w/c, agreeable to session. No pain reported.  Pt declined self-care needs. Declined ambulating with request to do only in room exercises, but with encouragement was receptive to go to gym. Transported dependently in w/c > gym.   Pt requested to try standing, however unable despite many cues for making it more  efficient. Mod A sit > stand using table for balance, then pt remained in stance with CGA using table for UE support, during about 5 min x3 trials during connect 4 game against therapist. Pt required intermittent rest breaks between each trial due to global fatigue.   Transitioned to dynamic balance task with mod A sit > stand using RW, then placed/remove squigz from long mirror with CGA for balance while using RW for greater than 5 mins. Extended seated rest needed  and ice chips for dry mouth.  Pt declined ambulating back to room, therefore transported dependently in w/c back to room. Pt and her mother were very happy about OT recommendation to DC sooner and agreed with the plan. Care team updated about status.  Pt remained seated in w/c, with belt alarm on/activated, and with all needs in reach at end of session.   Therapy Documentation Precautions:  Precautions Precautions: Sternal, Fall, ICD/Pacemaker Precaution Comments: Anxious. Monitor (O2 > 90%), and HR Restrictions Weight Bearing Restrictions: No Other Position/Activity Restrictions: sternal precautions    Therapy/Group: Individual Therapy  Melvyn Novas, MS, OTR/L  04/07/2023, 2:15 PM

## 2023-04-07 NOTE — Progress Notes (Signed)
Occupational Therapy Session Note  Patient Details  Name: Colleen [name] MRN: [id number] Date of Birth: 1961-02-23  Today's Date: 04/07/2023 OT Individual Time: Missed visit due to pt at HD added late  OT Individual Time Calculation (min): Missed 30 min session    Short Term Goals: Week 2:  OT Short Term Goal 1 (Week 2): STG = LTG due to ELOS  Skilled Therapeutic Interventions/Progress Updates:   Pt with added HD tx and out of room upon OT arrival for scheduled session. Will attempt make up minutes as able.    Therapy Documentation Precautions:  Precautions Precautions: Sternal, Fall, ICD/Pacemaker Precaution Comments: Anxious. Monitor (O2 > 90%), and HR Restrictions Weight Bearing Restrictions: No Other Position/Activity Restrictions: sternal precautions   [name] 04/07/2023, 7:29 AM

## 2023-04-07 NOTE — Progress Notes (Addendum)
Patient ID: Colleen [name], female   DOB: November 17, 1961, 62 y.o.   MRN: [id number]  Sw discussed all DME recommendations with mother and patient. Sw requested [employer] to contact mother while present at hospital. SW also provided pt and mother with contact information to reach out to [employer] if they have not received contact. Family education not arranged by pt and family. No additional questions or concerns.   12:43 PM pt and daughter calling [employer] to confirm items. Pt mother has a brand new RW available for patient at d/c.

## 2023-04-07 NOTE — Progress Notes (Addendum)
Patient ID: Colleen [name], female   DOB: 24-Jun-1961, 62 y.o.   MRN: [id number]  Patient plans to have Elnita Maxwell confirm family education today for potentially tomorrow or Thursday. Patient and mother prefer not to provide contact information and plan to reach out. SW will follow up around lunch.

## 2023-04-07 NOTE — Progress Notes (Signed)
Received patient in bed to unit.  Alert and oriented.  Informed consent signed and in chart.   TX duration: 3hrs  Patient tolerated well.  Alert, without acute distress.  Hand-off given to patient's nurse.   Access used: L AVF Access issues: None  Total UF removed: Medication(s) given: Albumin 25g IV x1  Post HD weight: 65.4kg   04/07/23 1840  Vitals  Temp 98 F (36.7 C)  Temp Source Oral  BP 98/73  MAP (mmHg) 80  BP Location Right Arm  BP Method Automatic  Patient Position (if appropriate) Lying  Pulse Rate 90  Pulse Rate Source Monitor  ECG Heart Rate 90  Resp (!) 23  Oxygen Therapy  SpO2 100 %  O2 Device Room Air  Patient Activity (if Appropriate) In bed  Pulse Oximetry Type Continuous  During Treatment Monitoring  Intra-Hemodialysis Comments Tx completed;Tolerated well  Post Treatment  Dialyzer Clearance Lightly streaked  Duration of HD Treatment -hour(s) 3.25 hour(s)  Liters Processed 77.8  Fluid Removed (mL) 3000 mL  Tolerated HD Treatment Yes  AVG/AVF Arterial Site Held (minutes) 8 minutes  AVG/AVF Venous Site Held (minutes) 8 minutes  Fistula / Graft Left Upper arm Arteriovenous fistula  No Placement Date or Time found.   Placed prior to admission: Yes  Orientation: Left  Access Location: Upper arm  Access Type: (c) Arteriovenous fistula  Site Condition No complications  Fistula / Graft Assessment Present;Thrill;Bruit  Status Deaccessed     [facility]

## 2023-04-07 NOTE — Progress Notes (Signed)
PROGRESS NOTE   Subjective/Complaints: No new complaints this morning Satting well but still using O2 at night, will d/c order Discussed that she has HD today   ROS:   Pt denies SOB, abd pain, CP, N/V/C/D, and vision changes  Objective:   No results found. Recent Labs    04/06/23 0547  WBC 9.0  HGB 10.6*  HCT 33.6*  PLT 170   No results for input(s): "NA", "K", "CL", "CO2", "GLUCOSE", "BUN", "CREATININE", "CALCIUM" in the last 72 hours.   Intake/Output Summary (Last 24 hours) at 04/07/2023 1116 Last data filed at 04/07/2023 4098 Gross per 24 hour  Intake 480 ml  Output --  Net 480 ml     Pressure Injury 03/24/23 Buttocks Left Stage 2 -  Partial thickness loss of dermis presenting as a shallow open injury with a red, pink wound bed without slough. (Active)  03/24/23 1500  Location: Buttocks  Location Orientation: Left  Staging: Stage 2 -  Partial thickness loss of dermis presenting as a shallow open injury with a red, pink wound bed without slough.  Wound Description (Comments):   Present on Admission: Yes    Physical Exam: Vital Signs Blood pressure 100/74, pulse 93, temperature (!) 97.4 F (36.3 C), temperature source Axillary, resp. rate 18, height 5' 3.86" (1.622 m), weight 69.9 kg, last menstrual period 12/08/2013, SpO2 100 %. Gen: no distress, normal appearing, BMI 26.57 HEENT: oral mucosa pink and moist, NCAT, right sided facial weakness Cardio: Reg rate Chest: normal effort, normal rate of breathing Abd: soft, non-distended Ext: no edema Psych: pleasant, normal affect Skin: intact Neurological: Ox3  Musculoskeletal:     Cervical back: Neck supple. No tenderness.     Comments: Ue's 5-/5 B/L HF/KE 3+/5 B/L; DF/PF 4+/5 B/L  Skin:    General: chest wall incisions CDI    Comments: 2-3+ edema BLE- and some wooding in hands and arms- from reduced swelling--stable to improved L buttock pressure  ulcer- Stage II- ~1.5 x 0.5 cm- pink- slightly open Incision closed on R chest and L chest from prior HD catheters Fistula L upper arm- (+) thrill  +LE edema Neurological:     Mental Status: She is alert.     Comments: Right facial weakness without dysarthria. Speech slow but clear. Oriented X 4. Intact to light touch in all 4 extremities per pt  Fully oriented       Assessment/Plan: 1. Functional deficits which require 3+ hours per day of interdisciplinary therapy in a comprehensive inpatient rehab setting. Physiatrist is providing close team supervision and 24 hour management of active medical problems listed below. Physiatrist and rehab team continue to assess barriers to discharge/monitor patient progress toward functional and medical goals  Care Tool:  Bathing    Body parts bathed by patient: Right arm, Left arm, Chest, Abdomen, Face, Right upper leg, Left upper leg   Body parts bathed by helper: Front perineal area, Buttocks, Right lower leg, Left lower leg     Bathing assist Assist Level: Supervision/Verbal cueing     Upper Body Dressing/Undressing Upper body dressing   What is the patient wearing?: Button up shirt    Upper body assist Assist  Level: Minimal Assistance - Patient > 75%    Lower Body Dressing/Undressing Lower body dressing      What is the patient wearing?: Incontinence brief, Pants     Lower body assist Assist for lower body dressing: Moderate Assistance - Patient 50 - 74%     Toileting Toileting    Toileting assist Assist for toileting: Moderate Assistance - Patient 50 - 74%     Transfers Chair/bed transfer  Transfers assist  Chair/bed transfer activity did not occur: Safety/medical concerns (weakness, fear)  Chair/bed transfer assist level: Supervision/Verbal cueing     Locomotion Ambulation   Ambulation assist   Ambulation activity did not occur: Safety/medical concerns (fatigue, fear, generalized weakness)  Assist level:  Contact Guard/Touching assist Assistive device: Walker-rolling Max distance: 73ft   Walk 10 feet activity   Assist  Walk 10 feet activity did not occur: Safety/medical concerns (fatigue, fear, generalized weakness)  Assist level: Contact Guard/Touching assist Assistive device: Walker-rolling   Walk 50 feet activity   Assist Walk 50 feet with 2 turns activity did not occur: Safety/medical concerns (fatigue, fear, generalized weakness)  Assist level: Contact Guard/Touching assist Assistive device: Walker-rolling    Walk 150 feet activity   Assist Walk 150 feet activity did not occur: Safety/medical concerns (fatigue, fear, generalized weakness)         Walk 10 feet on uneven surface  activity   Assist Walk 10 feet on uneven surfaces activity did not occur: Safety/medical concerns (fatigue, fear, generalized weakness)         Wheelchair     Assist Is the patient using a wheelchair?: Yes Type of Wheelchair: Manual Wheelchair activity did not occur: Safety/medical concerns (fatigue, fear, generalized weakness)         Wheelchair 50 feet with 2 turns activity    Assist    Wheelchair 50 feet with 2 turns activity did not occur: Safety/medical concerns (fatigue, fear, generalized weakness)       Wheelchair 150 feet activity     Assist  Wheelchair 150 feet activity did not occur: Safety/medical concerns (fatigue, fear, generalized weakness)       Blood pressure 100/74, pulse 93, temperature (!) 97.4 F (36.3 C), temperature source Axillary, resp. rate 18, height 5' 3.86" (1.622 m), weight 69.9 kg, last menstrual period 12/08/2013, SpO2 100 %.    Medical Problem List and Plan: 1. Functional deficits secondary to debility after MV repair with sternal precautions             -patient may  shower if cover incisions- HD catheters in chest are out- also has sternal incisions that need to be covered             -ELOS/Goals: 10-14 days supervision to  min A  Continue CIR PT and OT 2.  Mechanical MVR: INR therapeutic and heparin drip d/ced.  Pharmaceutical: Coumadin  --I4mg  today 3. Pain Management:  Tylenol prn. Lidocaine patches prn. Denies pain 4. Insomnia: increase melatonin to 10mg  HS 4/20  -Charge nurse spoke with her about PM nursing care.  5. Anxiety: neuropsych consulted 6. Skin/Wound Care: Routine pressure relief measures with frequent pressure relief measures for sacral decub.             --Interdry to skin folds.              --Add vitamin C and Zinc to promote wound healing 7. Fluids/Electrolytes/Nutrition: Strict I/O. Labs with HD.  --Will keep diet as regular to help with food choices             --  Juven and nephro added. 8. MVR/TVR by Dr. Felipa Furnace [phone]): Continue Sternal precautions.              --PPM for CHB             --coumadin with INR goal 2.5-3.5 9.  Hypotension: Monitor for symptoms with increase in activity             --on midodrine, Staterra and Droxidopa. Blood pressures reviewed and have improved  4/28- Well controlled- no hypotension-  10.  ESRD: Schedule HD at the end of the day. Renal diet with 1200 cc FR.  11. T2DM: Provided list of foods that are good for diabetes. D/c HS SSI. D/c daytime ISS, d/c breakfast aspart and instead add on semglee HS 6U. Decrease Nepro to BID BM 12. A fib: Monitor HR TID. No BB due to hypotension. HR continues to be elevated, continue to monitor             --on coumadin.  4.27- HR running 80s-90's- con't to monitor trend 13. Anemia of chronic disease: Monitor H/H with HD labs/             --transfuse prn Hgb <7.0 or if symptomatic.  14. Anxiety- pt cannot tolerate door/curtain closed 15. New O2 requirement with mild tachypnea/volume overload -  -volume mgt per nephrology with HD  -continue oxygen as needed--humidfy  4/28- only uses O2 at night- on lap this AM 16. Wound on left elbow: ordered manuka honey daily.  17. Vitamin D insufficiency: continue ergocalciferol  50,000U once per week for 7 weeks 18. Shortness of breath: improved, reviewed CXR results with her that show pleural effusion and atelectasis, incentive spirometer ordered, educated regarding its benefits, d/c O2  19. Epistaxis left nostril--likely exacerbated by O2 [place] and elevated INR  -Hgb reviewed and trending upward  -4mg  coumadin today  -will schedule afrin  -resolved  -d/c O2  -change nasal spray to prn  20. Tachycardia: continue to monitor HR TID  21. Overweight: provide dietary education, weight decreasing, transition from more short acting to long acting insulin as tolerated, BMI reviewed and is currently 26.57    LOS: 14 days A FACE TO FACE EVALUATION WAS PERFORMED  Abenezer Odonell P Novie Maggio 04/07/2023, 11:16 AM

## 2023-04-07 NOTE — Progress Notes (Signed)
Have been in discussion with pharmacy regarding patient's meds to treat hypotension. Droxidopa was denied and is $200/- per month out of pocket. Misty Stanley did some research and indicated that midodrine could be titrated to 40 mg TID for BP support. Reached out to Dr. Arlean Hopping who recommended stopping droxidopa and monitoring BP off it. Reached out to pharmacy again who reported that this does not need taper so will stop and monitor BP off it.    Patient does not have local cardiologist --reached out to see if her PCP's coumadin clinic will be willing to monitor it or if she will need to go to [facility] for management. Awaiting call back.

## 2023-04-07 NOTE — Progress Notes (Signed)
ANTICOAGULATION CONSULT NOTE   Pharmacy Consult for Warfarin , INR goal 2.5-3.5  Indication: Mechanical MVR  Allergies  Allergen Reactions   Farxiga [Dapagliflozin] Other (See Comments)    Cannot take due to kidneys   Hydrocodone Other (See Comments)    "Increase Glucose level," per pt   Kombiglyze Xr [Saxagliptin-Metformin Er] Other (See Comments)    Cannot take due to kidneys    Patient Measurements: Height: 5' 3.86" (162.2 cm) (from [facility] records on 02/18/23) Weight: 69.9 kg (154 lb 1.6 oz) IBW/kg (Calculated) : 54.37 Heparin Dosing Weight: 70.1 kg   Vital Signs: Temp: 97.4 F (36.3 C) (04/30 0351) Temp Source: Axillary (04/30 0351) BP: 100/74 (04/30 0351) Pulse Rate: 93 (04/30 0351)  Labs: Recent Labs    04/05/23 0816 04/06/23 0547 04/07/23 0804  HGB  --  10.6*  --   HCT  --  33.6*  --   PLT  --  170  --   LABPROT 29.3* 27.9* 28.8*  INR 2.8* 2.6* 2.7*     Estimated Creatinine Clearance: 8.3 mL/min (A) (by C-G formula based on SCr of 6.86 mg/dL (H)).   Medical History: Past Medical History:  Diagnosis Date   Anemia    Diabetes (HCC)    type II   DVT (deep venous thrombosis) (HCC)    E-coli UTI    ESRD on hemodialysis (HCC)    MWF at [facility]   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    Gout 06/05/2018   History of blood transfusion    History of claustrophobia    HLD (hyperlipidemia) 06/05/2018   Hypertension    PE (pulmonary embolism)    Pulled muscle    pt has right sided facial droop from pulled muscle in face since birth   Retinopathy    Was going blind and had surgery with improvement   Assessment: Admit 03/24/23, patient admitted to [facility] , from [facility] (3/14-4/16/24) s/pMVR with valvuloplasty TV with ring insertion, AV decalcification with removal of LV mass 02/19/23. Developed CHB and Afib post-op, 3/21 underwent leadless PPM placement. Patient has a history of DVT/PE. 2016, 2019)  No longer on [facility] prior to [facility] admission. Has been off Eliquis for years.  [facility] started her on Warfarin ~ 3/16. Pharmacy consulted for warfarin dosing.   Of note: Amiodarone started ~3/21 w/load, 4/10 started Amio 200mg  QD. Anticipate amiodarone effect on warfarin dosing should be stable at this time. Patient with mild epistaxis 4/19-4/22, likely related to nasal cannula, now resolved. Note patient with poor PO intake ranging 0-65% of meals.   INR 2.7 tooday    Goal of Therapy:  INR 2.5-3.5 (mechanical MVR) Monitor platelets per anticoagulation protocol: Yes   Plan:  Warfarin 4 mg PO x1 Monitor daily INR until stable then decrease frequency, weekly CBC   Thank you Okey Regal, PharmD  Please check AMION for all [facility] Pharmacy phone numbers After 10:00 PM, call Main Pharmacy [phone]

## 2023-04-07 NOTE — Telephone Encounter (Signed)
Patient Advocate Encounter  Received notification that the request for prior authorization for Droxidopa 100MG capsules  has been denied     Sylvia Helms, CPhT Pharmacy Patient Advocate Specialist [place] [facility] Patient Advocate Team Direct Number: [phone]  Fax: [phone] 

## 2023-04-08 ENCOUNTER — Other Ambulatory Visit (HOSPITAL_COMMUNITY): Payer: Self-pay

## 2023-04-08 DIAGNOSIS — R5381 Other malaise: Secondary | ICD-10-CM | POA: Diagnosis not present

## 2023-04-08 LAB — GLUCOSE, CAPILLARY
Glucose-Capillary: 140 mg/dL — ABNORMAL HIGH (ref 70–99)
Glucose-Capillary: 216 mg/dL — ABNORMAL HIGH (ref 70–99)
Glucose-Capillary: 77 mg/dL (ref 70–99)
Glucose-Capillary: 83 mg/dL (ref 70–99)

## 2023-04-08 MED ORDER — ATOMOXETINE HCL 18 MG PO CAPS
18.0000 mg | ORAL_CAPSULE | Freq: Every day | ORAL | 0 refills | Status: DC
  Filled 2023-04-08: qty 30, 30d supply, fill #0

## 2023-04-08 MED ORDER — ASPIRIN 81 MG PO CHEW
81.0000 mg | CHEWABLE_TABLET | Freq: Every day | ORAL | 0 refills | Status: DC
  Filled 2023-04-08: qty 30, 30d supply, fill #0

## 2023-04-08 MED ORDER — WARFARIN SODIUM 4 MG PO TABS
4.0000 mg | ORAL_TABLET | Freq: Every day | ORAL | 0 refills | Status: DC
  Filled 2023-04-08: qty 30, 30d supply, fill #0

## 2023-04-08 MED ORDER — VITAMIN D (ERGOCALCIFEROL) 1.25 MG (50000 UNIT) PO CAPS
50000.0000 [IU] | ORAL_CAPSULE | ORAL | 0 refills | Status: DC
  Filled 2023-04-08: qty 5, 35d supply, fill #0

## 2023-04-08 MED ORDER — CALCIUM POLYCARBOPHIL 625 MG PO TABS
625.0000 mg | ORAL_TABLET | Freq: Every day | ORAL | 0 refills | Status: DC
  Filled 2023-04-08: qty 30, 30d supply, fill #0

## 2023-04-08 MED ORDER — MELATONIN 5 MG PO TABS
10.0000 mg | ORAL_TABLET | Freq: Every day | ORAL | 0 refills | Status: DC
  Filled 2023-04-08: qty 30, fill #0
  Filled 2023-04-08: qty 60, 30d supply, fill #0

## 2023-04-08 MED ORDER — WARFARIN SODIUM 4 MG PO TABS
4.0000 mg | ORAL_TABLET | Freq: Every day | ORAL | Status: DC
  Administered 2023-04-08: 4 mg via ORAL
  Filled 2023-04-08: qty 1

## 2023-04-08 MED ORDER — MIDODRINE HCL 10 MG PO TABS
20.0000 mg | ORAL_TABLET | Freq: Three times a day (TID) | ORAL | 0 refills | Status: DC
  Filled 2023-04-08: qty 180, 30d supply, fill #0

## 2023-04-08 MED ORDER — SIMETHICONE 80 MG PO CHEW
80.0000 mg | CHEWABLE_TABLET | Freq: Four times a day (QID) | ORAL | 0 refills | Status: DC
  Filled 2023-04-08 (×2): qty 120, 30d supply, fill #0

## 2023-04-08 MED ORDER — INSULIN GLARGINE-YFGN 100 UNIT/ML ~~LOC~~ SOLN
8.0000 [IU] | Freq: Every day | SUBCUTANEOUS | Status: DC
  Administered 2023-04-08: 8 [IU] via SUBCUTANEOUS
  Filled 2023-04-08 (×3): qty 0.08

## 2023-04-08 MED ORDER — SALINE SPRAY 0.65 % NA SOLN
1.0000 | NASAL | 0 refills | Status: DC | PRN
  Filled 2023-04-08: qty 44, 30d supply, fill #0

## 2023-04-08 MED ORDER — BASAGLAR KWIKPEN 100 UNIT/ML ~~LOC~~ SOPN
8.0000 [IU] | PEN_INJECTOR | Freq: Every day | SUBCUTANEOUS | 0 refills | Status: DC
  Filled 2023-04-08: qty 3, 28d supply, fill #0

## 2023-04-08 MED ORDER — FAMOTIDINE 10 MG PO TABS
10.0000 mg | ORAL_TABLET | Freq: Every day | ORAL | 0 refills | Status: DC
  Filled 2023-04-08: qty 60, 30d supply, fill #0
  Filled 2023-04-09: qty 30, 30d supply, fill #0

## 2023-04-08 MED ORDER — INSULIN PEN NEEDLE 32G X 4 MM MISC
1.0000 | Freq: Every day | 0 refills | Status: DC
  Filled 2023-04-08: qty 100, 90d supply, fill #0

## 2023-04-08 MED ORDER — RENA-VITE PO TABS
1.0000 | ORAL_TABLET | Freq: Every day | ORAL | 0 refills | Status: DC
  Filled 2023-04-08 (×2): qty 30, 30d supply, fill #0

## 2023-04-08 MED ORDER — AMIODARONE HCL 200 MG PO TABS
200.0000 mg | ORAL_TABLET | Freq: Every day | ORAL | 0 refills | Status: DC
  Filled 2023-04-08: qty 30, 30d supply, fill #0

## 2023-04-08 MED ORDER — MEDIHONEY WOUND/BURN DRESSING EX PSTE
1.0000 | PASTE | Freq: Every day | CUTANEOUS | 0 refills | Status: DC
  Filled 2023-04-08: qty 44, 44d supply, fill #0

## 2023-04-08 NOTE — Progress Notes (Signed)
Occupational Therapy Discharge Summary  Patient Details  Name: Colleen [name] MRN: [id number] Date of Birth: 12/22/1960  Date of Discharge from OT service:Apr 08, 2023  Patient has met 6 of 10 long term goals due to improved activity tolerance, improved balance, and improved coordination.  Patient to discharge at overall Min/Mod A level.  Patient's care partner is independent to provide the necessary physical and cognitive assistance at discharge. Per pt and her mother, family plans to pay a primary caregiver, named "Elnita Maxwell" however despite many efforts, did not attend family education training and pt refused to allow CSW to set up education with Elnita Maxwell. Pt's mother did attend therapy session on 5/1 to observe only (as she is unable to provide any physical assist). Pt's discharge date was moved up due to limited progress towards, as well as pt demonstrating self-limiting behaviors and refusing to attempt functional tasks during sessions to help prepare her for discharge home.   Reasons goals not met: LB dressing, dynamic standing, sit > stand and cognitive awareness goals not met due to self-limiting behaviors, resistive nature to education/cues, poor carryover, as well as endurance, balance and cognitive deficits, requiring increased assist.  Recommendation:  Patient will benefit from ongoing skilled OT services in home health setting to continue to advance functional skills in the area of BADL and Reduce care partner burden.  Equipment: 3 in 1 BSC & TTB  Reasons for discharge: lack of progress toward goals, treatment goals met, and discharge from hospital  Patient/family agrees with progress made and goals achieved: Yes  OT Discharge Precautions/Restrictions  Precautions Precautions: Sternal;Fall;ICD/Pacemaker Precaution Comments: particular with care Restrictions Weight Bearing Restrictions: No Other Position/Activity Restrictions: sternal precautions ADL ADL Eating: Set up Where  Assessed-Eating: Wheelchair Grooming: Setup Where Assessed-Grooming: Sitting at sink Upper Body Bathing: Setup Where Assessed-Upper Body Bathing: Sitting at sink Lower Body Bathing: Minimal assistance Where Assessed-Lower Body Bathing: Sitting at sink, Standing at sink Upper Body Dressing: Supervision/safety Where Assessed-Upper Body Dressing: Edge of bed Lower Body Dressing: Moderate assistance Where Assessed-Lower Body Dressing: Sitting at sink, Standing at sink Toileting: Minimal assistance Where Assessed-Toileting: IT consultant Method: Surveyor, minerals: Grab bars, Raised toilet seat, Other (comment) (RW) Tub/Shower Transfer: Scientific laboratory technician Method: Ship broker: Emergency planning/management officer, Other (comment) (RW) Walk-In Shower Transfer: Unable to assess (refused to trial) Film/video editor Method: Unable to assess Vision Baseline Vision/History: 1 Wears glasses (reports needing them intermittently however doesn't have with her) Patient Visual Report: Blurring of vision;Other (comment) (reports only seeing light vs contrast and has had "9 eye surgeries") Vision Assessment?: Vision impaired- to be further tested in functional context Perception  Perception: Within Functional Limits Praxis Praxis: Intact Cognition Cognition Overall Cognitive Status: Within Functional Limits for tasks assessed Arousal/Alertness: Awake/alert Orientation Level: Person;Place;Situation Person: Oriented Place: Oriented Situation: Oriented Memory: Appears intact Awareness: Impaired Problem Solving: Impaired Safety/Judgment: Impaired Comments: unaware of how to maintain sternal precautions when performing functional tasks Brief Interview for Mental Status (BIMS) Repetition of Three Words (First Attempt): 3 Temporal Orientation: Year: Correct Temporal Orientation: Month: Accurate within 5 days Temporal  Orientation: Day: Correct Recall: "Sock": Yes, no cue required Recall: "Blue": Yes, no cue required Recall: "Bed": Yes, no cue required BIMS Summary Score: 15 Sensation Sensation Light Touch: Appears Intact Hot/Cold: Not tested Proprioception: Appears Intact Stereognosis: Not tested Coordination Gross Motor Movements are Fluid and Coordinated: Yes (generalized weakness/deconditioning, self-limiting behaviors, decreased balance/coordination, and decreased endurance) Fine Motor Movements  are Fluid and Coordinated: No Finger Nose Finger Test: slow but WFL Heel Shin Test: decreased ROM Motor  Motor Motor: Within Functional Limits Motor - Skilled Clinical Observations: generalized weakness/deconditioning, self-limiting behaviors, decreased balance/coordination, and decreased endurance Mobility  Bed Mobility Bed Mobility: Rolling Right;Rolling Left;Sit to Supine;Supine to Sit Rolling Right: Supervision/verbal cueing Rolling Left: Supervision/Verbal cueing Supine to Sit: Supervision/Verbal cueing (with HOB elevated) Sit to Supine: Moderate Assistance - Patient 50-74% Transfers Sit to Stand: Minimal Assistance - Patient > 75% Stand to Sit: Supervision/Verbal cueing  Trunk/Postural Assessment  Cervical Assessment Cervical Assessment: Exceptions to [facility] (forward head) Thoracic Assessment Thoracic Assessment: Exceptions to [facility] (thoracic rounding) Lumbar Assessment Lumbar Assessment: Exceptions to [facility] (posterior pelvic tilt) Postural Control Postural Control: Within Functional Limits  Balance Balance Balance Assessed: Yes Static Sitting Balance Static Sitting - Balance Support: Feet supported;Bilateral upper extremity supported Static Sitting - Level of Assistance: 6: Modified independent (Device/Increase time) Dynamic Sitting Balance Dynamic Sitting - Balance Support: Feet supported;No upper extremity supported Dynamic Sitting - Level of Assistance: 6: Modified independent  (Device/Increase time) Static Standing Balance Static Standing - Balance Support: Bilateral upper extremity supported;During functional activity (RW) Static Standing - Level of Assistance: 5: Stand by assistance (supervision) Dynamic Standing Balance Dynamic Standing - Balance Support: Bilateral upper extremity supported;During functional activity (RW) Dynamic Standing - Level of Assistance: 5: Stand by assistance (CGA) Dynamic Standing - Comments: with transfers and gait Extremity/Trunk Assessment RUE Assessment RUE Assessment: Exceptions to [facility] Active Range of Motion (AROM) Comments: WFL distally, WFL shoulder flexion to 90 but did not assess further due to sternal precautions General Strength Comments: 3-/5 grossly; did not formally test proximally 2/2 sternal precautions LUE Assessment LUE Assessment: Exceptions to [facility] Active Range of Motion (AROM) Comments: WFL distally, WFL shoulder flexion to 90 but did not assess further due to sternal precautions General Strength Comments: 3-/5 grossly; did not formally test proximally 2/2 sternal precautions   Anirudh Baiz E Riaan [place], MS, OTR/L  04/08/2023, 10:39 AM

## 2023-04-08 NOTE — Progress Notes (Signed)
PROGRESS NOTE   Subjective/Complaints: No new complaints this morning She did use oxygen last night, discussed that this will not be covered for her at home given her excellent saturations   ROS:   Pt denies SOB, abd pain, CP, N/V/C/D, and vision changes  Objective:   No results found. Recent Labs    04/06/23 0547 04/07/23 1229  WBC 9.0 9.6  HGB 10.6* 10.1*  HCT 33.6* 32.1*  PLT 170 173   Recent Labs    04/07/23 1229  NA 132*  K 3.6  CL 90*  CO2 23  GLUCOSE 266*  BUN 52*  CREATININE 8.79*  CALCIUM 8.0*     Intake/Output Summary (Last 24 hours) at 04/08/2023 1056 Last data filed at 04/08/2023 0756 Gross per 24 hour  Intake 597 ml  Output 3000 ml  Net -2403 ml     Pressure Injury 03/24/23 Buttocks Left Stage 2 -  Partial thickness loss of dermis presenting as a shallow open injury with a red, pink wound bed without slough. (Active)  03/24/23 1500  Location: Buttocks  Location Orientation: Left  Staging: Stage 2 -  Partial thickness loss of dermis presenting as a shallow open injury with a red, pink wound bed without slough.  Wound Description (Comments):   Present on Admission: Yes    Physical Exam: Vital Signs Blood pressure (!) 102/54, pulse 91, temperature 97.9 F (36.6 C), resp. rate 18, height 5' 3.86" (1.622 m), weight 65.7 kg, last menstrual period 12/08/2013, SpO2 100 %. Gen: no distress, normal appearing, BMI 26.57 HEENT: oral mucosa pink and moist, NCAT, right sided facial weakness, impaired vision Cardio: Reg rate Chest: normal effort, normal rate of breathing Abd: soft, non-distended Ext: no edema Psych: pleasant, normal affect Skin: intact Neurological: Ox3  Musculoskeletal:     Cervical back: Neck supple. No tenderness.     Comments: Ue's 5-/5 B/L HF/KE 3+/5 B/L; DF/PF 4+/5 B/L  Skin:    General: chest wall incisions CDI    Comments: 2-3+ edema BLE- and some wooding in hands and  arms- from reduced swelling--stable to improved L buttock pressure ulcer- Stage II- ~1.5 x 0.5 cm- pink- slightly open Incision closed on R chest and L chest from prior HD catheters Fistula L upper arm- (+) thrill  +LE edema Neurological:     Mental Status: She is alert.     Comments: Right facial weakness without dysarthria. Speech slow but clear. Oriented X 4. Intact to light touch in all 4 extremities per pt  Fully oriented       Assessment/Plan: 1. Functional deficits which require 3+ hours per day of interdisciplinary therapy in a comprehensive inpatient rehab setting. Physiatrist is providing close team supervision and 24 hour management of active medical problems listed below. Physiatrist and rehab team continue to assess barriers to discharge/monitor patient progress toward functional and medical goals  Care Tool:  Bathing    Body parts bathed by patient: Right arm, Left arm, Chest, Abdomen, Face, Right upper leg, Left upper leg, Front perineal area   Body parts bathed by helper: Right lower leg, Left lower leg, Buttocks     Bathing assist Assist Level: Minimal Assistance -  Patient > 75%     Upper Body Dressing/Undressing Upper body dressing   What is the patient wearing?: Button up shirt    Upper body assist Assist Level: Supervision/Verbal cueing    Lower Body Dressing/Undressing Lower body dressing      What is the patient wearing?: Incontinence brief, Pants     Lower body assist Assist for lower body dressing: Moderate Assistance - Patient 50 - 74%     Toileting Toileting    Toileting assist Assist for toileting: Minimal Assistance - Patient > 75%     Transfers Chair/bed transfer  Transfers assist  Chair/bed transfer activity did not occur: Safety/medical concerns (weakness, fear)  Chair/bed transfer assist level: Supervision/Verbal cueing     Locomotion Ambulation   Ambulation assist   Ambulation activity did not occur: Safety/medical  concerns (fatigue, fear, generalized weakness)  Assist level: Contact Guard/Touching assist Assistive device: Walker-rolling Max distance: 79ft   Walk 10 feet activity   Assist  Walk 10 feet activity did not occur: Safety/medical concerns (fatigue, fear, generalized weakness)  Assist level: Contact Guard/Touching assist Assistive device: Walker-rolling   Walk 50 feet activity   Assist Walk 50 feet with 2 turns activity did not occur: Safety/medical concerns (fatigue, fear, generalized weakness)  Assist level: Contact Guard/Touching assist Assistive device: Walker-rolling    Walk 150 feet activity   Assist Walk 150 feet activity did not occur: Safety/medical concerns (fatigue, self-limiting)         Walk 10 feet on uneven surface  activity   Assist Walk 10 feet on uneven surfaces activity did not occur: Safety/medical concerns (fatigue, fear, generalized weakness)         Wheelchair     Assist Is the patient using a wheelchair?: Yes Type of Wheelchair: Manual Wheelchair activity did not occur: Safety/medical concerns (fatigue, fear, generalized weakness)         Wheelchair 50 feet with 2 turns activity    Assist    Wheelchair 50 feet with 2 turns activity did not occur: Safety/medical concerns (fatigue, fear, generalized weakness)       Wheelchair 150 feet activity     Assist  Wheelchair 150 feet activity did not occur: Safety/medical concerns (fatigue, fear, generalized weakness)       Blood pressure (!) 102/54, pulse 91, temperature 97.9 F (36.6 C), resp. rate 18, height 5' 3.86" (1.622 m), weight 65.7 kg, last menstrual period 12/08/2013, SpO2 100 %.    Medical Problem List and Plan: 1. Functional deficits secondary to debility after MV repair with sternal precautions             -patient may  shower if cover incisions- HD catheters in chest are out- also has sternal incisions that need to be covered             -ELOS/Goals:  10-14 days supervision to min A  Continue CIR PT and OT 2.  Mechanical MVR: INR therapeutic and heparin drip d/ced.  Pharmaceutical: Coumadin  --I4mg  today 3. Pain Management:  Tylenol prn. Lidocaine patches prn. Denies pain 4. Insomnia: increase melatonin to 10mg  HS 4/20  -Charge nurse spoke with her about PM nursing care.  5. Anxiety: neuropsych consulted 6. Skin/Wound Care: Routine pressure relief measures with frequent pressure relief measures for sacral decub.             --Interdry to skin folds.              --Add vitamin C and Zinc to promote wound  healing 7. Fluids/Electrolytes/Nutrition: Strict I/O. Labs with HD.  --Will keep diet as regular to help with food choices             --Juven and nephro added. 8. MVR/TVR by Dr. Felipa Furnace [phone]): Continue Sternal precautions.              --PPM for CHB             --coumadin with INR goal 2.5-3.5 9.  Hypotension: Monitor for symptoms with increase in activity             --on midodrine, Staterra and Droxidopa. Blood pressures reviewed and have improved  4/28- Well controlled- no hypotension-  10.  ESRD: Schedule HD at the end of the day. Renal diet with 1200 cc FR.  11. T2DM: Provided list of foods that are good for diabetes. D/c HS SSI. D/c daytime ISS, d/c breakfast aspart and instead add on semglee HS 6U. Decrease Nepro to BID BM 12. A fib: Monitor HR TID. No BB due to hypotension. HR continues to be elevated, continue to monitor             --on coumadin.  4.27- HR running 80s-90's- con't to monitor trend 13. Anemia of chronic disease: Monitor H/H with HD labs/             --transfuse prn Hgb <7.0 or if symptomatic.  14. Anxiety- pt cannot tolerate door/curtain closed 15. New O2 requirement with mild tachypnea/volume overload -  -volume mgt per nephrology with HD  -continue oxygen as needed--humidfy  4/28- only uses O2 at night- on lap this AM 16. Wound on left elbow: ordered manuka honey daily.  17. Vitamin D  insufficiency: continue ergocalciferol 50,000U once per week for 7 weeks 18. Shortness of breath: improved, reviewed CXR results with her that show pleural effusion and atelectasis, incentive spirometer ordered, educated regarding its benefits, d/c O2  19. Epistaxis left nostril--likely exacerbated by O2 [place] and elevated INR  -resolved  -Hgb reviewed and trending upward  -4mg  coumadin today  -afrin d/ced  -resolved  -d/c O2  -change nasal spray to prn  20. Tachycardia: continue to monitor HR TID  21. Overweight: resolved, provide dietary education, weight decreasing, transition from more short acting to long acting insulin as tolerated, BMI reviewed and is currently 24.97  22. Impaired vision: encourage outpatient follow-up with opthalmology    LOS: 15 days A FACE TO FACE EVALUATION WAS PERFORMED  Clint Bolder P Kacey Vicuna 04/08/2023, 10:56 AM

## 2023-04-08 NOTE — Plan of Care (Signed)
  Problem: RH Balance Goal: LTG Patient will maintain dynamic standing with ADLs (OT) Description: LTG:  Patient will maintain dynamic standing balance with assist during activities of daily living (OT)  Outcome: Not Met (add Reason) Flowsheets (Taken 04/08/2023 1039) LTG: Pt will maintain dynamic standing balance during ADLs with: (Not met due to self-limitng behaviors, poor carryover, and balance/endurance deficits) --   Problem: Sit to Stand Goal: LTG:  Patient will perform sit to stand in prep for activites of daily living with assistance level (OT) Description: LTG:  Patient will perform sit to stand in prep for activites of daily living with assistance level (OT) Outcome: Not Met (add Reason) Flowsheets (Taken 04/08/2023 1039) LTG: PT will perform sit to stand in prep for activites of daily living with assistance level: (Not met due to self-limitng behaviors, poor carryover, and balance/endurance deficits) --   Problem: RH Dressing Goal: LTG Patient will perform lower body dressing w/assist (OT) Description: LTG: Patient will perform lower body dressing with assist, with/without cues in positioning using equipment (OT) Outcome: Not Met (add Reason) Flowsheets (Taken 04/08/2023 1039) LTG: Pt will perform lower body dressing with assistance level of: (Not met due to self-limitng behaviors, poor carryover, and balance/endurance deficits) --   Problem: RH Awareness Goal: LTG: Patient will demonstrate awareness during functional activites type of (OT) Description: LTG: Patient will demonstrate awareness during functional activites type of (OT) Outcome: Not Met (add Reason) Flowsheets (Taken 04/08/2023 1039) LTG: Patient will demonstrate awareness during functional activites type of (OT): (Not met due to self-limitng behaviors, poor carryover, and balance/endurance deficits) --   Problem: RH Balance Goal: LTG: Patient will maintain dynamic sitting balance (OT) Description: LTG:  Patient will  maintain dynamic sitting balance with assistance during activities of daily living (OT) Outcome: Completed/Met   Problem: RH Bathing Goal: LTG Patient will bathe all body parts with assist levels (OT) Description: LTG: Patient will bathe all body parts with assist levels (OT) Outcome: Completed/Met   Problem: RH Dressing Goal: LTG Patient will perform upper body dressing (OT) Description: LTG Patient will perform upper body dressing with assist, with/without cues (OT). Outcome: Completed/Met   Problem: RH Toileting Goal: LTG Patient will perform toileting task (3/3 steps) with assistance level (OT) Description: LTG: Patient will perform toileting task (3/3 steps) with assistance level (OT)  Outcome: Completed/Met   Problem: RH Toilet Transfers Goal: LTG Patient will perform toilet transfers w/assist (OT) Description: LTG: Patient will perform toilet transfers with assist, with/without cues using equipment (OT) Outcome: Completed/Met   Problem: RH Tub/Shower Transfers Goal: LTG Patient will perform tub/shower transfers w/assist (OT) Description: LTG: Patient will perform tub/shower transfers with assist, with/without cues using equipment (OT) Outcome: Completed/Met

## 2023-04-08 NOTE — Progress Notes (Signed)
   04/08/23 1708  Vitals  Pulse Rate 90  Resp (!) 22  BP (!) 100/54  SpO2 100 %  Post Treatment  Dialyzer Clearance Lightly streaked  Duration of HD Treatment -hour(s) 3 hour(s)  Hemodialysis Intake (mL) 0 mL  Liters Processed 72  Fluid Removed (mL) 2200 mL  Tolerated HD Treatment Yes  AVG/AVF Arterial Site Held (minutes) 8 minutes  AVG/AVF Venous Site Held (minutes) 8 minutes   Received patient in bed to unit.  Alert and oriented.  Informed consent signed and in chart.   TX duration:3hrs  Patient tolerated well.  Transported back to the room  Alert, without acute distress.  Hand-off given to patient's nurse.   Access used: LAVF Access issues: none  Total UF removed: 2.2L Medication(s) given: none    [name]

## 2023-04-08 NOTE — Progress Notes (Signed)
Physical Therapy Discharge Summary  Patient Details  Name: Colleen [name] MRN: [id number] Date of Birth: 24-Feb-1961  Date of Discharge from PT service:Apr 08, 2023  Patient has met 2 of 9 long term goals due to improved activity tolerance, improved balance, improved postural control, and increased strength. Patient to discharge at a wheelchair level Min Assist using RW. Pt's primary caregiver, Colleen [name], did not attend family education training and pt refused to allow CSW to set up education with Colleen [name]. Pt's mother did attend therapy session on 5/1 to observe only (as she is unable to provide any physical assist), however pt and mother were heavily educated that pt will require hands on physical assist at discharge. Pt's discharge date was moved up due to limited progress with pt demonstrating self-limiting behaviors and refusing to attempt functional tasks during sessions to help prepare her for discharge home.   Reasons goals not met: Pt did not meet current goals of supervision overall as pt currently requires mod A for sit<>supine, min A for sit<>stands with RW, and min A to ambulate 69ft with RW. Pt also limited by self-limiting behaviors, generalized weakness/deconditioning, and refusal to attempt certain functional tasks.  Recommendation:  Patient will benefit from ongoing skilled PT services in home health setting to continue to advance safe functional mobility, address ongoing impairments in transfers, generalized strengthening and endurance, dynamic standing balance/coordination, and to minimize fall risk.  Equipment: 18x18 manual WC with bilateral elevating legrests, hospital bed, and RW  Reasons for discharge: lack of progress toward goals and discharge from hospital  Patient/family agrees with progress made and goals achieved: Yes  PT Discharge Precautions/Restrictions Precautions Precautions: Sternal;Fall;ICD/Pacemaker Precaution Comments: particular with  care Restrictions Weight Bearing Restrictions: No Other Position/Activity Restrictions: sternal precautions Pain Interference Pain Interference Pain Effect on Sleep: 0. Does not apply - I have not had any pain or hurting in the past 5 days Pain Interference with Therapy Activities: 0. Does not apply - I have not received rehabilitationtherapy in the past 5 days Pain Interference with Day-to-Day Activities: 1. Rarely or not at all Cognition Overall Cognitive Status: Within Functional Limits for tasks assessed Arousal/Alertness: Awake/alert Orientation Level: Oriented X4 Memory: Appears intact Awareness: Impaired Problem Solving: Impaired Safety/Judgment: Impaired Comments: unaware of how to maintain sternal precautions when performing functional tasks Sensation Sensation Light Touch: Appears Intact Hot/Cold: Not tested Proprioception: Appears Intact Stereognosis: Not tested Coordination Gross Motor Movements are Fluid and Coordinated: Yes (generalized weakness/deconditioning, self-limiting behaviors, decreased balance/coordination, and decreased endurance) Fine Motor Movements are Fluid and Coordinated: No Finger Nose Finger Test: slow but WFL Heel Shin Test: decreased ROM Motor  Motor Motor: Within Functional Limits Motor - Skilled Clinical Observations: generalized weakness/deconditioning, self-limiting behaviors, decreased balance/coordination, and decreased endurance  Mobility Bed Mobility Bed Mobility: Rolling Right;Rolling Left;Sit to Supine;Supine to Sit Rolling Right: Supervision/verbal cueing Rolling Left: Supervision/Verbal cueing Supine to Sit: Supervision/Verbal cueing (with HOB elevated) Sit to Supine: Moderate Assistance - Patient 50-74% Transfers Transfers: Stand to Sit;Sit to Stand;Stand Pivot Transfers Sit to Stand: Minimal Assistance - Patient > 75% Stand to Sit: Supervision/Verbal cueing Stand Pivot Transfers: Supervision/Verbal cueing Transfer  (Assistive device): Rolling walker Locomotion  Gait Ambulation: Yes Gait Assistance: Minimal Assistance - Patient > 75% Gait Distance (Feet): 96 Feet Assistive device: Rolling walker Gait Gait: Yes Gait Pattern: Step-to pattern;Step-through pattern;Decreased step length - right;Decreased step length - left;Poor foot clearance - left;Poor foot clearance - right;Narrow base of support Gait velocity: decreased Stairs / Additional Locomotion Stairs: No Ramp: Minimal  Assistance - Patient >75% (RW) Pick up small object from the floor assist level: Minimal Assistance - Patient > 75% Pick up small object from the floor assistive device: tissue box using Engineer, manufacturing Wheelchair Mobility: Yes Wheelchair Assistance: Dependent - Patient 0% Wheelchair Parts Management: Needs assistance Distance: >168ft  Trunk/Postural Assessment  Cervical Assessment Cervical Assessment: Exceptions to [facility] (forward head) Thoracic Assessment Thoracic Assessment: Exceptions to [facility] (thoracic rounding) Lumbar Assessment Lumbar Assessment: Exceptions to [facility] (posterior pelvic tilt) Postural Control Postural Control: Within Functional Limits  Balance Balance Balance Assessed: Yes Static Sitting Balance Static Sitting - Balance Support: Feet supported;Bilateral upper extremity supported Static Sitting - Level of Assistance: 6: Modified independent (Device/Increase time) Dynamic Sitting Balance Dynamic Sitting - Balance Support: Feet supported;No upper extremity supported Dynamic Sitting - Level of Assistance: 6: Modified independent (Device/Increase time) Static Standing Balance Static Standing - Balance Support: Bilateral upper extremity supported;During functional activity (RW) Static Standing - Level of Assistance: 5: Stand by assistance (supervision) Dynamic Standing Balance Dynamic Standing - Balance Support: Bilateral upper extremity supported;During functional activity (RW) Dynamic Standing  - Level of Assistance: 5: Stand by assistance (supervision) Dynamic Standing - Comments: with transfers Extremity Assessment  RLE Assessment RLE Assessment: Exceptions to [facility] General Strength Comments: tested sitting EOB RLE Strength Right Hip Flexion: 4-/5 Right Hip ABduction: 4-/5 Right Hip ADduction: 4-/5 Right Knee Flexion: 4-/5 Right Knee Extension: 4-/5 Right Ankle Dorsiflexion: 4/5 Right Ankle Plantar Flexion: 4/5 LLE Assessment LLE Assessment: Exceptions to [facility] General Strength Comments: tested sitting EOB LLE Strength Left Hip Flexion: 4-/5 Left Hip ABduction: 4-/5 Left Hip ADduction: 4-/5 Left Knee Flexion: 4-/5 Left Knee Extension: 4-/5 Left Ankle Dorsiflexion: 4/5 Left Ankle Plantar Flexion: 4/5   Monquie Fulgham M Imagene Gurney PT, DPT  04/08/2023, 7:04 AM

## 2023-04-08 NOTE — Progress Notes (Signed)
TURNED UF OFF DUE TO LOW BP - NOTIFIED CANDICE RN

## 2023-04-08 NOTE — Progress Notes (Addendum)
Noted plans for pt d/c to home tomorrow. Contacted [facility] SW and spoke to [place]. Clinic advised of pt's d/c date for tomorrow and that pt will resume on Friday. Pt's schedule is MWF with 5:30 am arrival for 5:45 am chair time. Message left for pt on cell number to advise pt that clinic is aware of d/c date.   Olivia Canter Renal Navigator [phone]

## 2023-04-08 NOTE — Progress Notes (Signed)
Physical Therapy Session Note  Patient Details  Name: Colleen [name] MRN: [id number] Date of Birth: 1961-01-28  Today's Date: 04/08/2023 PT Individual Time: [phone] and [id number] PT Individual Time Calculation (min): 57 min and 56 min  Short Term Goals: Week 1:  PT Short Term Goal 1 (Week 1): pt will transfer sit<>stand with LRAD and min A PT Short Term Goal 1 - Progress (Week 1): Progressing toward goal PT Short Term Goal 2 (Week 1): pt will transfer bed<>chair with LRAD and min A PT Short Term Goal 2 - Progress (Week 1): Met PT Short Term Goal 3 (Week 1): pt will ambulate 6ft with LRAD and min A PT Short Term Goal 3 - Progress (Week 1): Met Week 2:  PT Short Term Goal 1 (Week 2): STG=LTG due to LOS  Skilled Therapeutic Interventions/Progress Updates:   Treatment Session 1 Received pt semi-reclined in bed, pt agreeable to PT treatment, and denied any pain during session. Session with emphasis on discharge planning, functional mobility/transfers, dressing, generalized strengthening and endurance, and gait training. Pt on supplemental O2 upon entry. Informed pt that MD discharged order - but pt insisted on having it last night, despite not needing it. Informed pt of risks of receiving O2 if it is not needed.  Donned ted hose and non-skid socks dependently for edema management. Pt transferred semi-reclined<>sitting EOB with HOB elevated and supervision. Donned button up shirt with mod A to thread UEs through for time management x 2 trials (as pt didn't like the first shirt once it was on). Donned brief and pants sitting EOB with max A and stood from elevated EOB with RW and close supervision and required total A to pull pants/brief over hips. Pt requested a "few minutes" prior to ambulating and sat EOB eating ice chips while PT went through sensation, MMT, and pain interference questionnaire. RN present briefly to administer medication, however pt began yelling and arguing with RN regarding  events from yesterday; therefore RN planning to return after therapy. Pt stood from elevated EOB with RW and close supervision and ambulated 44ft with RW and CGA to dayroom (pt with 1 L lateral LOB requiring assist to correct). Returned to room and concluded session with pt sitting in WC, needs within reach, and BLEs elevated for edema management.   Treatment Session 2 Received pt sitting in [facility] with equipment being delivered. Pt agreeable to PT treatment and denied any pain during session. Session with emphasis on discharge planning, functional mobility/transfers, generalized strengthening and endurance, dynamic standing balance/coordination, and ambulation. Pt stood from [facility] with RW and mod A and transferred into new WC with RW and CGA/close supervision. Then practiced standing from new WC - still requires light mod/min A to rise. Adjusted height of RW and transported pt to/from room in [facility] dependently as feet do not touch floor, preventing pt from performing WC mobility using LEs.   Pt refused to practice stair navigation. In ortho gym, stood from [facility] with RW and min A and ambulated 67ft on uneven surfaces (ramp) with RW and light min A. Transported to dayroom, and stood from [facility] with RW and min A and picked up tissue box using reacher and min A. Returned to room and stood with RW and min A and transferred WC<>bed stand<>pivot with RW and CGA/close supervision. Doffed brief/pants in standing with total A. Removed ted hose and non-skid socks dependently and doffed button up shirt with mod A. Pt transferred sit<>supine with mod A for BLE management. Concluded session  with pt semi-reclined in bed, needs within reach, and bed alarm on. BLE elevated for edema management.   Therapy Documentation Precautions:  Precautions Precautions: Sternal, Fall, ICD/Pacemaker Precaution Comments: Anxious. Monitor (O2 > 90%), and HR Restrictions Weight Bearing Restrictions: No Other Position/Activity Restrictions: sternal  precautions  Therapy/Group: Individual Therapy Martin Majestic PT, DPT  04/08/2023, 6:56 AM

## 2023-04-08 NOTE — Progress Notes (Signed)
ANTICOAGULATION CONSULT NOTE   Pharmacy Consult for Warfarin , INR goal 2.5-3.5  Indication: Mechanical MVR  Allergies  Allergen Reactions   Farxiga [Dapagliflozin] Other (See Comments)    Cannot take due to kidneys   Hydrocodone Other (See Comments)    "Increase Glucose level," per pt   Kombiglyze Xr [Saxagliptin-Metformin Er] Other (See Comments)    Cannot take due to kidneys    Patient Measurements: Height: 5' 3.86" (162.2 cm) (from [facility] records on 02/18/23) Weight: 65.7 kg (144 lb 13.5 oz) IBW/kg (Calculated) : 54.37 Heparin Dosing Weight: 70.1 kg   Vital Signs: Temp: 97.9 F (36.6 C) (05/01 0520) BP: 102/54 (05/01 0520) Pulse Rate: 91 (05/01 0520)  Labs: Recent Labs    04/06/23 0547 04/07/23 0804 04/07/23 1229  HGB 10.6*  --  10.1*  HCT 33.6*  --  32.1*  PLT 170  --  173  LABPROT 27.9* 28.8*  --   INR 2.6* 2.7*  --   CREATININE  --   --  8.79*     Estimated Creatinine Clearance: 6.3 mL/min (A) (by C-G formula based on SCr of 8.79 mg/dL (H)).   Medical History: Past Medical History:  Diagnosis Date   Anemia    Diabetes (HCC)    type II   DVT (deep venous thrombosis) (HCC)    E-coli UTI    ESRD on hemodialysis (HCC)    MWF at [facility]   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    Gout 06/05/2018   History of blood transfusion    History of claustrophobia    HLD (hyperlipidemia) 06/05/2018   Hypertension    PE (pulmonary embolism)    Pulled muscle    pt has right sided facial droop from pulled muscle in face since birth   Retinopathy    Was going blind and had surgery with improvement   Assessment: Admit 03/24/23, patient admitted to [facility] , from [facility] (3/14-4/16/24) s/pMVR with valvuloplasty TV with ring insertion, AV decalcification with removal of LV mass 02/19/23. Developed CHB and Afib post-op, 3/21 underwent leadless PPM placement. Patient has a history of DVT/PE. 2016, 2019)  No longer on [facility] prior to [facility] admission. Has been off Eliquis for years. [facility]  started her on Warfarin ~ 3/16. Pharmacy consulted for warfarin dosing.   Of note: Amiodarone started ~3/21 w/load, 4/10 started Amio 200mg  QD. Anticipate amiodarone effect on warfarin dosing should be stable at this time. Patient with mild epistaxis 4/19-4/22, likely related to nasal cannula, now resolved. Note patient with poor PO intake ranging 0-65% of meals.    Goal of Therapy:  INR 2.5-3.5 (mechanical MVR) Monitor platelets per anticoagulation protocol: Yes   Plan:  Warfarin 4 mg PO daily  Monitor daily INR until stable then decrease frequency, weekly CBC   Thank you Okey Regal, PharmD  Please check AMION for all [facility] phone numbers After 10:00 PM, call Main Pharmacy [phone]

## 2023-04-08 NOTE — Progress Notes (Signed)
Pt refused lab draw this AM. Pt stated that she wanted to wait for HD this afternoon to have labs drawn then. AM nurse made aware.   Ladona Horns, LPN

## 2023-04-08 NOTE — Progress Notes (Addendum)
[facility] Progress Note   Subjective:   Had HD yesterday with net UF 3L. Reports legs feel less swollen. Denies SOB, CP, dizziness  Objective Vitals:   04/07/23 1845 04/07/23 2027 04/08/23 0500 04/08/23 0520  BP: 106/60 115/61  (!) 102/54  Pulse: 89 96  91  Resp: (!) 24 18  18   Temp:  (!) 97.5 F (36.4 C)  97.9 F (36.6 C)  TempSrc:      SpO2: 97% 100%  100%  Weight:   65.7 kg   Height:       Physical Exam General: Alert female in NAD. + edema lower face Heart: RRR, no murmur Lungs: CTA bilaterally Abdomen: Soft, non-distended, +BS Extremities:legs elevated, 1+ edema bilaterally  Dialysis Access: LUE AVF + bruit  Additional Objective Labs: Basic Metabolic Panel: Recent Labs  Lab 04/01/23 1400 04/03/23 1300 04/07/23 1229  NA 135 132* 132*  K 3.0* 4.3 3.6  CL 94* 91* 90*  CO2 27 22 23   GLUCOSE 83 216* 266*  BUN 12 36* 52*  CREATININE 2.55* 6.86* 8.79*  CALCIUM 7.7* 7.8* 8.0*  PHOS 1.6* 5.2* 5.4*   Liver Function Tests: Recent Labs  Lab 04/01/23 1400 04/03/23 1300 04/07/23 1229  ALBUMIN 2.5* 2.4* 2.4*   No results for input(s): "LIPASE", "AMYLASE" in the last 168 hours. CBC: Recent Labs  Lab 04/01/23 1400 04/03/23 1300 04/06/23 0547 04/07/23 1229  WBC 9.7 11.2* 9.0 9.6  HGB 10.6* 10.4* 10.6* 10.1*  HCT 34.2* 32.6* 33.6* 32.1*  MCV 90.2 88.8 89.6 88.2  PLT 273 233 170 173   Blood Culture    Component Value Date/Time   SDES BLOOD RIGHT HAND 12/11/2020 1151   SPECREQUEST  12/11/2020 1151    BOTTLES DRAWN AEROBIC AND ANAEROBIC Blood Culture adequate volume   CULT  12/11/2020 1151    NO GROWTH 5 DAYS Performed at [facility], [address], [place], [place] [address]    REPTSTATUS 12/16/2020 FINAL 12/11/2020 1151   CBG: Recent Labs  Lab 04/06/23 2013 04/07/23 0614 04/07/23 1119 04/07/23 2040 04/08/23 0612  GLUCAP 262* 259* 313* 214* 140*    Medications:   amiodarone  200 mg Oral Daily   aspirin EC  81 mg  Oral Daily   atomoxetine  18 mg Oral Daily   Chlorhexidine Gluconate Cloth  6 each Topical Q0600   Chlorhexidine Gluconate Cloth  6 each Topical Q0600   Chlorhexidine Gluconate Cloth  6 each Topical Q0600   Chlorhexidine Gluconate Cloth  6 each Topical Q0600   cinacalcet  60 mg Oral Q M,W,F   darbepoetin (ARANESP) injection - DIALYSIS  60 mcg Subcutaneous Q Fri-1800   doxercalciferol  3 mcg Intravenous Q M,W,F-HD   famotidine  10 mg Oral BID   feeding supplement (NEPRO CARB STEADY)  237 mL Oral BID BM   insulin aspart  5 Units Subcutaneous BID AC   insulin glargine-yfgn  7 Units Subcutaneous QHS   leptospermum manuka honey  1 Application Topical Daily   melatonin  10 mg Oral QHS   midodrine  20 mg Oral TID WC   multivitamin  1 tablet Oral QHS   nutrition supplement (JUVEN)  1 packet Oral BID BM   polycarbophil  625 mg Oral Daily   simethicone  80 mg Oral QID   Vitamin D (Ergocalciferol)  50,000 Units Oral Q7 days   warfarin  4 mg Oral q1600   Warfarin - Pharmacist Dosing Inpatient   Does not apply q1600  Outpatient Dialysis Orders: MWF SW 3h  400/1.5  69.5kg 2K/2Ca bath  Heparin 3000  AVF LUA - last OP HD 3/13, post wt 69.5kg - sensipar 60mg  po tiw - hectorol 3 mcg IV tiw - venofer 50 mg IV weekly   62yo F s/p [facility] admit 3/13-4/16/24 for MV replacement, TV ring, AV decalcification with removal of LV mass. Post-op, she have A-fib and CHB, underwent leadless PPM on 02/26/23. Hospitalization c/b hypotension, on midodrine + Strattera + Droxidopa. Will be on warfarin + ASA for coagulation. Transferred to CIR for rehab.   Assessment/Plan: 1. Valvular heart disease s/p MVR, TV ring, AoV decalcification with removal LV mass 02/18/23: On warfarin for anticoagulation. 2. ESRD: Will continue outpatient HD schedule MWF-  She refuses to run > 3hr treatment, had been refusing extra HD for volume removal- but then was agreeable to an extra treatment but unfortunately this was unable to be done  last week due to high dialysis census. Had HD yesterday (missed Monday), resume MWF schedule today.  3. HYPOTN/volume: On midodrine 20mg  TID, droxidopa 500mg  TID, and atomoxetine 18mg  daily for hypotension. Apparently droxidopa will not be covered by insurance so it was stopped.  BP ok today, increase midodrine as needed. She is very edematous - will see what we can get off with dialysis, using low temp and albumin. 3L off yesterday.  4. Anemia of ESRD: Hgb 10.1-  last Aranesp on 4/19. 5. Secondary hyperparathyroidism:  Corr/Phos at goal, on cinacalcet 60mg  TIW and hectorol. Hold binders for now since phos is reasonable  6. Nutrition:  On protein supplements 7. T2DM: On insulin, per primary. 8. A-fib: On amiodarone. 9. Debility: In CIR - follow progress.  She says she will be discharged on 5/4    Rogers Blocker, PA-C 04/08/2023, 9:15 AM  [facility] Pager: [phone]

## 2023-04-08 NOTE — Progress Notes (Signed)
Occupational Therapy Session Note  Patient Details  Name: Colleen [name] MRN: [id number] Date of Birth: [birth year]  Today's Date: 04/08/2023 OT Individual Time: 0900-1010 OT Individual Time Calculation (min): 70 min    Short Term Goals: Week 2:  OT Short Term Goal 1 (Week 2): STG = LTG due to ELOS  Skilled Therapeutic Interventions/Progress Updates:  Skilled OT intervention completed with focus on core/BUE strengthening, functional transfers without AD, dynamic balance. Pt received seated in w/c, agreeable to session. No pain reported.  MD present for rounds and nursing for meds. Assessed BIMs during medication administration, with pt scoring 15/15 this trial demonstrating improved cognition.   Pt declined ambulating therefore transported pt dependently <> gym.  Pt requested to transfer to mat without A or AD, but unable to stand without help. Mod A sit > stand with R HHA, then min increasing to mod A stand pivot with HHA to EOM with step by step cues needed for placement of B feet due to fear of falling.  Seated EOM pt participated in the following exercises to promote core/BUE strength/endurance: -Modified russian twists with 2 lb med ball (5x20). Extended rest breaks needed with pt complaining of neck/back strain demanding to return back to w/c but with offer for supported sit as her rest breaks with large ball, pt able to intermittently tolerate sitting EOM for 75% of session.  -Bicep curls with 2 lb dowel 5x10 with again supported sit rest breaks and 1/2 cup water provided per request  Sit > stand using RW with min A, then was able to maintain balance with CGA using RW during cornhole toss activity. Pt was self-limiting with this activity, only agreeing to do "5 per side." CGA stand pivot with RW to w/c, then tossed the remaining bags from seated position.   Back in room pt remained seated in w/c with BLE elevated, with belt alarm on/activated, and with all needs in reach at end of  session.   Therapy Documentation Precautions:  Precautions Precautions: Sternal, Fall, ICD/Pacemaker Precaution Comments: particular with care Restrictions Weight Bearing Restrictions: No Other Position/Activity Restrictions: sternal precautions    Therapy/Group: Individual Therapy  Melvyn Novas, MS, OTR/L  04/08/2023, 10:38 AM

## 2023-04-08 NOTE — Progress Notes (Addendum)
Team Conference Report to Patient/Family  Team Conference discussion was reviewed with the patient and caregiver, including goals, any changes in plan of care and target discharge date.  Patient and caregiver express understanding and are in agreement.  The patient has a target discharge date of 04/09/23.  Sw met with patient and provided team conference updates. Pt has confirmed that she will have transportation tomorrow between 9 AM-11 AM on Thursday. Brother will be present to review team conference updates with PA. Caregiver/friend Elnita Maxwell has provided her contact information for therapist to reach out.  Elnita Maxwell [phone]   2:15 PM: Sw informed by brother that friend will be unable to transport pt to HD due to not driving at night. Sw will email brother transportation resources and will provide pt with [employer] [place] application.   Brother requesting [employer]. SW informed brother that he will need to reach out to inquire about services. Sw will still provide Access [employer].   Andria Rhein 04/08/2023, 1:29 PM

## 2023-04-09 ENCOUNTER — Other Ambulatory Visit (HOSPITAL_COMMUNITY): Payer: Self-pay

## 2023-04-09 DIAGNOSIS — R5381 Other malaise: Secondary | ICD-10-CM | POA: Diagnosis not present

## 2023-04-09 LAB — GLUCOSE, CAPILLARY
Glucose-Capillary: 138 mg/dL — ABNORMAL HIGH (ref 70–99)
Glucose-Capillary: 82 mg/dL (ref 70–99)

## 2023-04-09 LAB — PROTIME-INR
INR: 3.4 — ABNORMAL HIGH (ref 0.8–1.2)
Prothrombin Time: 34.2 seconds — ABNORMAL HIGH (ref 11.4–15.2)

## 2023-04-09 MED ORDER — ATOMOXETINE HCL 18 MG PO CAPS: 18.0000 mg | ORAL_CAPSULE | Freq: Every day | ORAL | 0 refills | Status: DC

## 2023-04-09 MED ORDER — WARFARIN SODIUM 4 MG PO TABS
ORAL_TABLET | ORAL | 0 refills | Status: DC
  Filled 2023-04-09: qty 30, 28d supply, fill #0

## 2023-04-09 MED ORDER — INSULIN PEN NEEDLE 32G X 4 MM MISC: 1.0000 | Freq: Every day | 0 refills | Status: DC

## 2023-04-09 MED ORDER — CHLORHEXIDINE GLUCONATE CLOTH 2 % EX PADS: 6.0000 | MEDICATED_PAD | Freq: Every day | CUTANEOUS | Status: DC

## 2023-04-09 NOTE — Progress Notes (Signed)
PROGRESS NOTE   Subjective/Complaints: No new complaints this morning Ready for d/c Family is waiting downstairs CBGs much better controlled  ROS:   Pt denies SOB, abd pain, CP, N/V/C/D, and vision changes  Objective:   No results found. Recent Labs    04/07/23 1229  WBC 9.6  HGB 10.1*  HCT 32.1*  PLT 173   Recent Labs    04/07/23 1229  NA 132*  K 3.6  CL 90*  CO2 23  GLUCOSE 266*  BUN 52*  CREATININE 8.79*  CALCIUM 8.0*     Intake/Output Summary (Last 24 hours) at 04/09/2023 1017 Last data filed at 04/08/2023 1708 Gross per 24 hour  Intake 240 ml  Output 2200 ml  Net -1960 ml     Pressure Injury 03/24/23 Buttocks Left Stage 2 -  Partial thickness loss of dermis presenting as a shallow open injury with a red, pink wound bed without slough. (Active)  03/24/23 1500  Location: Buttocks  Location Orientation: Left  Staging: Stage 2 -  Partial thickness loss of dermis presenting as a shallow open injury with a red, pink wound bed without slough.  Wound Description (Comments):   Present on Admission: Yes    Physical Exam: Vital Signs Blood pressure (!) 102/56, pulse 92, temperature 97.6 F (36.4 C), temperature source Oral, resp. rate 19, height 5' 3.86" (1.622 m), weight 63.9 kg, last menstrual period 12/08/2013, SpO2 100 %. Gen: no distress, normal appearing, BMI 26.57 HEENT: oral mucosa pink and moist, NCAT, right sided facial weakness, impaired vision Cardio: Reg rate Chest: normal effort, normal rate of breathing Abd: soft, non-distended Ext: 1+ edema in lower extremities Psych: pleasant, normal affect Neurological: Ox3  Musculoskeletal:     Cervical back: Neck supple. No tenderness.     Comments: Ue's 5-/5 B/L HF/KE 3+/5 B/L; DF/PF 4+/5 B/L  Skin:    General: chest wall incisions CDI    Comments: 2-3+ edema BLE- and some wooding in hands and arms- from reduced swelling--stable to improved L  buttock pressure ulcer- Stage II- ~1.5 x 0.5 cm- pink- slightly open Incision closed on R chest and L chest from prior HD catheters Fistula L upper arm- (+) thrill  +LE edema Neurological:     Mental Status: She is alert.     Comments: Right facial weakness without dysarthria. Speech slow but clear. Oriented X 4. Intact to light touch in all 4 extremities per pt  Fully oriented       Assessment/Plan: 1. Functional deficits which require 3+ hours per day of interdisciplinary therapy in a comprehensive inpatient rehab setting. Physiatrist is providing close team supervision and 24 hour management of active medical problems listed below. Physiatrist and rehab team continue to assess barriers to discharge/monitor patient progress toward functional and medical goals  Care Tool:  Bathing    Body parts bathed by patient: Right arm, Left arm, Chest, Abdomen, Face, Right upper leg, Left upper leg, Front perineal area   Body parts bathed by helper: Right lower leg, Left lower leg, Buttocks     Bathing assist Assist Level: Minimal Assistance - Patient > 75%     Upper Body Dressing/Undressing Upper body dressing  What is the patient wearing?: Button up shirt    Upper body assist Assist Level: Supervision/Verbal cueing    Lower Body Dressing/Undressing Lower body dressing      What is the patient wearing?: Incontinence brief, Pants     Lower body assist Assist for lower body dressing: Moderate Assistance - Patient 50 - 74%     Toileting Toileting    Toileting assist Assist for toileting: Minimal Assistance - Patient > 75%     Transfers Chair/bed transfer  Transfers assist  Chair/bed transfer activity did not occur: Safety/medical concerns (weakness, fear)  Chair/bed transfer assist level: Supervision/Verbal cueing     Locomotion Ambulation   Ambulation assist   Ambulation activity did not occur: Safety/medical concerns (fatigue, fear, generalized  weakness)  Assist level: Contact Guard/Touching assist Assistive device: Walker-rolling Max distance: 59ft   Walk 10 feet activity   Assist  Walk 10 feet activity did not occur: Safety/medical concerns (fatigue, fear, generalized weakness)  Assist level: Contact Guard/Touching assist Assistive device: Walker-rolling   Walk 50 feet activity   Assist Walk 50 feet with 2 turns activity did not occur: Safety/medical concerns (fatigue, fear, generalized weakness)  Assist level: Contact Guard/Touching assist Assistive device: Walker-rolling    Walk 150 feet activity   Assist Walk 150 feet activity did not occur: Safety/medical concerns (fatigue, self-limiting)         Walk 10 feet on uneven surface  activity   Assist Walk 10 feet on uneven surfaces activity did not occur: Safety/medical concerns (fatigue, fear, generalized weakness)   Assist level: Minimal Assistance - Patient > 75% Assistive device: Walker-rolling   Wheelchair     Assist Is the patient using a wheelchair?: Yes Type of Wheelchair: Manual Wheelchair activity did not occur: Safety/medical concerns (fatigue, fear, generalized weakness)  Wheelchair assist level: Dependent - Patient 0% Max wheelchair distance: >118ft    Wheelchair 50 feet with 2 turns activity    Assist    Wheelchair 50 feet with 2 turns activity did not occur: Safety/medical concerns (fatigue, fear, generalized weakness)   Assist Level: Dependent - Patient 0%   Wheelchair 150 feet activity     Assist  Wheelchair 150 feet activity did not occur: Safety/medical concerns (fatigue, fear, generalized weakness)   Assist Level: Dependent - Patient 0%   Blood pressure (!) 102/56, pulse 92, temperature 97.6 F (36.4 C), temperature source Oral, resp. rate 19, height 5' 3.86" (1.622 m), weight 63.9 kg, last menstrual period 12/08/2013, SpO2 100 %.    Medical Problem List and Plan: 1. Functional deficits secondary to  debility after MV repair with sternal precautions             -patient may  shower if cover incisions- HD catheters in chest are out- also has sternal incisions that need to be covered             -ELOS/Goals: 10-14 days supervision to min A  D/c home 2.  Mechanical MVR: INR therapeutic and heparin drip d/ced.  Pharmaceutical: Coumadin  --I4mg  today, continue 4mg  daily at home.  3. Pain Management:  Tylenol prn. Continue Lidocaine patches prn. Denies pain 4. Insomnia: increase melatonin to 10mg  HS 4/20  -Charge nurse spoke with her about PM nursing care.  5. Anxiety: neuropsych consulted 6. Skin/Wound Care: Routine pressure relief measures with frequent pressure relief measures for sacral decub.             --Interdry to skin folds.              --  Add vitamin C and Zinc to promote wound healing 7. Fluids/Electrolytes/Nutrition: Strict I/O. Labs with HD.  --Will keep diet as regular to help with food choices             --Juven and nephro added. 8. MVR/TVR by Dr. Felipa Furnace [phone]): Continue Sternal precautions.              --PPM for CHB             --coumadin with INR goal 2.5-3.5 9.  Hypotension: Monitor for symptoms with increase in activity             --on midodrine, Staterra and Droxidopa. Blood pressures reviewed and have improved  4/28- Well controlled- no hypotension-  10.  ESRD: Schedule HD at the end of the day. Renal diet with 1200 cc FR.  11. T2DM: Provided list of foods that are good for diabetes. D/c HS SSI. D/c daytime ISS, d/c breakfast aspart and instead add on semglee HS 6U. Decrease Nepro to BID BM. CBGs reviewed and much better controlled 12. A fib: Monitor HR TID. No BB due to hypotension. HR continues to be elevated, continue to monitor             --on coumadin.  4.27- HR running 80s-90's- con't to monitor trend 13. Anemia of chronic disease: Monitor H/H with HD labs/             --transfuse prn Hgb <7.0 or if symptomatic.  14. Anxiety- pt cannot tolerate  door/curtain closed 15. New O2 requirement with mild tachypnea/volume overload -  -volume mgt per nephrology with HD  -continue oxygen as needed--humidfy  4/28- only uses O2 at night- on lap this AM 16. Wound on left elbow: ordered manuka honey daily.  17. Vitamin D insufficiency: continue ergocalciferol 50,000U once per week for 7 weeks 18. Shortness of breath: improved, reviewed CXR results with her that show pleural effusion and atelectasis, incentive spirometer ordered, educated regarding its benefits, d/c O2  19. Epistaxis left nostril--likely exacerbated by O2 [place] and elevated INR  -resolved  -Hgb reviewed and trending upward  -4mg  coumadin today  -afrin d/ced  -resolved  -d/c O2  -change nasal spray to prn  20. Tachycardia: continue to monitor HR TID  21. Overweight: resolved, provide dietary education, weight decreasing, transition from more short acting to long acting insulin as tolerated, BMI reviewed and is currently 24.97  22. Impaired vision: encourage outpatient follow-up with opthalmology   >30 minutes spent in discharge of patient including review of medications and follow-up appointments, physical examination, and in answering all patient's questions    LOS: 16 days A FACE TO FACE EVALUATION WAS PERFORMED  Clint Bolder P Drenda Sobecki 04/09/2023, 10:17 AM

## 2023-04-09 NOTE — Patient Care Conference (Signed)
Inpatient RehabilitationTeam Conference and Plan of Care Update Date: 04/08/2023   Time: 11:32 AM    Patient Name: Colleen [name]      Medical Record Number: [id number]  Date of Birth: 03-13-1961 Sex: Female         Room/Bed: 4W11C/4W11C-01 Payor Info: Payor: MEDICARE / Plan: MEDICARE PART A AND B / Product Type: *No Product type* /    Admit Date/Time:  03/24/2023  2:00 PM  Primary Diagnosis:  Debility  Hospital Problems: Principal Problem:   Debility Active Problems:   ESRD on dialysis (HCC)   Pressure injury of skin   S/P MVR (mitral valve repair)    Expected Discharge Date: Expected Discharge Date: 04/09/23  Team Members Present: Physician leading conference: Dr. Sula Soda Social Worker Present: Lavera Guise, BSW Nurse Present: Chana Bode, RN PT Present: Raechel Chute, PT OT Present: Candee Furbish, OT PPS Coordinator present : Fae Pippin, SLP     Current Status/Progress Goal Weekly Team Focus  Bowel/Bladder   Cont BM? Anuria (HD pt) LBM: 04/23   remain cont of BM   Qshift toileting and PRN    Swallow/Nutrition/ Hydration               ADL's   Set up A bathing/dressing, Mod A LB dressing, min/mod A toileting   Min A   DC planning, family ed, endurance, general strengthening, ADL retraining    Mobility   supine<>sit supervision and sit<>supine mod A using hospital bed features, sit<>stands with RW and min/mod A from lower surfaces and CGA/supervision from higher surfaces, gait 60ft with RW and CGA/min A, mod A for car transfers   supervision  functional mobility/transfers, generalized strengthening and endurance, dynamic standing balance/coordination, gait training, DME, D/C planning, and caregiver training    Communication                Safety/Cognition/ Behavioral Observations               Pain   Denies pain   remain free of pain   assess pain qshift and PRN    Skin   Stage 2 to sacrum (foam Dx)   maintain skin  integrety with no new breakdown  assess skin qshift and PRN      Discharge Planning:    Discharging home with assistance from sister's boyfriend and friends. Level entry apartment.   Team Discussion: Patient using supplemental oxygen for comfort at night; O2 saturations WNL on room air. Patient understands she does not need supplemental oxygen for home.  CBGs creeping up, MD adjusting medication. Patient's functional progress hindered by self limiting behaviors and resistance to practice functional tasks. Mother came in for education, and to observe therapy sessions, friend did not show; however patient declined family education and mother is not able to provide physical assistance.   Patient on target to meet rehab goals: Currently the patient requires mod - total assist for ADLs and min - mod for toileting. Needs supervision for supine - sit and standing from elevated surfaces. Needs min - mod assist from lower surfaces and mod assist for car transfers. Goals for discharge set for min assist for ADLs and supervision for ambulation; she did not meet any of her physical therapy goals.  *See Care Plan and progress notes for long and short-term goals.   Revisions to Treatment Plan:  OP Neuro psychology referral   Teaching Needs: Safety, medications, dietary modifications, skin care, transfers, etc.   Current Barriers to Discharge: Decreased caregiver  support, Wound care, and Lack of/limited family support  Possible Resolutions to Barriers: Family education scheduled for 04/07/23; declined HH follow up services DME: TTB, BSC, RW, W/C and hospital bed     Medical Summary Current Status: s/p MVR, uncontrolled type 2 diabetes, hypotension, tachycardia  Barriers to Discharge: Medical stability  Barriers to Discharge Comments: s/p MVR, uncontrolled type 2 diabetes, hypotension, tachycardia Possible Resolutions to [employer]: continue daily dosing of coumadin as per pharmacy  recommedations based on daily INR, increase Semglee to 7U, continue midodrine TID, continue to monitor BP/HR   Continued Need for Acute Rehabilitation Level of Care: The patient requires daily medical management by a physician with specialized training in physical medicine and rehabilitation for the following reasons: Direction of a multidisciplinary physical rehabilitation program to maximize functional independence : Yes Medical management of patient stability for increased activity during participation in an intensive rehabilitation regime.: Yes Analysis of laboratory values and/or radiology reports with any subsequent need for medication adjustment and/or medical intervention. : Yes   I attest that I was present, lead the team conference, and concur with the assessment and plan of the team.   Chana Bode B 04/09/2023, 10:40 AM

## 2023-04-09 NOTE — Progress Notes (Signed)
[facility] Progress Note   Subjective:   Had another 2.2L UF yesterday. Reports feeling great, edema in legs is improved, no SOB and reports her cough is improving. Denies CP, dizziness, nausea. She reports she is going home today.  Objective Vitals:   04/08/23 1708 04/08/23 1824 04/09/23 0500 04/09/23 0536  BP: (!) 100/54 (!) 101/57  (!) 102/56  Pulse: 90 91  92  Resp: (!) 22 17  19   Temp:  (!) 97.5 F (36.4 C)  97.6 F (36.4 C)  TempSrc:    Oral  SpO2: 100% 99%  100%  Weight: 63.7 kg  63.9 kg   Height:       Physical Exam General: Alert female in NAD Heart: RRR, no murmurs, rubs or gallops Lungs: CTA bilaterally, on RA Abdomen: Soft, non-distended, +BS Extremities: 1+ edema b/l lower extremities Dialysis Access: LUE AVF + bruit  Additional Objective Labs: Basic Metabolic Panel: Recent Labs  Lab 04/03/23 1300 04/07/23 1229  NA 132* 132*  K 4.3 3.6  CL 91* 90*  CO2 22 23  GLUCOSE 216* 266*  BUN 36* 52*  CREATININE 6.86* 8.79*  CALCIUM 7.8* 8.0*  PHOS 5.2* 5.4*   Liver Function Tests: Recent Labs  Lab 04/03/23 1300 04/07/23 1229  ALBUMIN 2.4* 2.4*   No results for input(s): "LIPASE", "AMYLASE" in the last 168 hours. CBC: Recent Labs  Lab 04/03/23 1300 04/06/23 0547 04/07/23 1229  WBC 11.2* 9.0 9.6  HGB 10.4* 10.6* 10.1*  HCT 32.6* 33.6* 32.1*  MCV 88.8 89.6 88.2  PLT 233 170 173   Blood Culture    Component Value Date/Time   SDES BLOOD RIGHT HAND 12/11/2020 1151   SPECREQUEST  12/11/2020 1151    BOTTLES DRAWN AEROBIC AND ANAEROBIC Blood Culture adequate volume   CULT  12/11/2020 1151    NO GROWTH 5 DAYS Performed at [facility], [address] [place], [place] [address]    REPTSTATUS 12/16/2020 FINAL 12/11/2020 1151    Cardiac Enzymes: No results for input(s): "CKTOTAL", "CKMB", "CKMBINDEX", "TROPONINI" in the last 168 hours. CBG: Recent Labs  Lab 04/08/23 1208 04/08/23 1814 04/08/23 2136 04/09/23 0042  04/09/23 0616  GLUCAP 216* 77 83 138* 82   Iron Studies: No results for input(s): "IRON", "TIBC", "TRANSFERRIN", "FERRITIN" in the last 72 hours. @lablastinr3 @ Studies/Results: No results found. Medications:   amiodarone  200 mg Oral Daily   aspirin EC  81 mg Oral Daily   atomoxetine  18 mg Oral Daily   Chlorhexidine Gluconate Cloth  6 each Topical Q0600   Chlorhexidine Gluconate Cloth  6 each Topical Q0600   Chlorhexidine Gluconate Cloth  6 each Topical Q0600   Chlorhexidine Gluconate Cloth  6 each Topical Q0600   cinacalcet  60 mg Oral Q M,W,F   darbepoetin (ARANESP) injection - DIALYSIS  60 mcg Subcutaneous Q Fri-1800   doxercalciferol  3 mcg Intravenous Q M,W,F-HD   famotidine  10 mg Oral BID   feeding supplement (NEPRO CARB STEADY)  237 mL Oral BID BM   insulin aspart  5 Units Subcutaneous BID AC   insulin glargine-yfgn  8 Units Subcutaneous QHS   leptospermum manuka honey  1 Application Topical Daily   melatonin  10 mg Oral QHS   midodrine  20 mg Oral TID WC   multivitamin  1 tablet Oral QHS   nutrition supplement (JUVEN)  1 packet Oral BID BM   polycarbophil  625 mg Oral Daily   simethicone  80 mg  Oral QID   Vitamin D (Ergocalciferol)  50,000 Units Oral Q7 days   warfarin  4 mg Oral q1600   Warfarin - Pharmacist Dosing Inpatient   Does not apply q1600    Outpatient Dialysis Orders: MWF SW 3h  400/1.5  69.5kg 2K/2Ca bath  Heparin 3000  AVF LUA - last OP HD 3/13, post wt 69.5kg - sensipar 60mg  po tiw - hectorol 3 mcg IV tiw - venofer 50 mg IV weekly   61yo F s/p [facility] admit 3/13-4/16/24 for MV replacement, TV ring, AV decalcification with removal of LV mass. Post-op, she have A-fib and CHB, underwent leadless PPM on 02/26/23. Hospitalization c/b hypotension, on midodrine + Strattera + Droxidopa. Will be on warfarin + ASA for coagulation. Transferred to CIR for rehab.    Assessment/Plan: 1. Valvular heart disease s/p MVR, TV ring, AoV decalcification with removal LV  mass 02/18/23: On warfarin for anticoagulation. 2. ESRD: Will continue outpatient HD schedule MWF-  She refuses to run > 3hr treatment. Missed HD Monday but eventually agreed to 3 hour HD Tuesday and Wednesday, now back on schedule with next HD tomorrow. 3. HYPOTN/volume: On midodrine 20mg  TID, droxidopa 500mg  TID, and atomoxetine 18mg  daily for hypotension. Apparently droxidopa will not be covered by insurance so it was stopped.  BP ok today, had some asymptomatic hypotension with HD yesterday. Can add mid HD midodrine dose if needed. Fortunately volume status is much better, 6.4kg off over the past week.  4. Anemia of ESRD: Hgb 10.1-  last Aranesp on 4/19. 5. Secondary hyperparathyroidism:  Corr/Phos at goal, on cinacalcet 60mg  TIW and hectorol. Hold binders for now since phos is reasonable  6. Nutrition:  On protein supplements 7. T2DM: On insulin, per primary. 8. A-fib: On amiodarone. 9. Debility: In CIR - follow progress.  She says she will be discharged today    Rogers Blocker, PA-C 04/09/2023, 9:59 AM  [facility] Pager: [phone]

## 2023-04-09 NOTE — Progress Notes (Signed)
Inpatient Rehabilitation Care Coordinator Discharge Note   Patient Details  Name: Colleen [name] MRN: [id number] Date of Birth: 1961-04-07   Discharge location: Home  Length of Stay: 17 Days  Discharge activity level: Min A  Home/community participation: Brother, friend Elnita Maxwell)  Patient response WU:JWJXBJ Literacy - How often do you need to have someone help you when you read instructions, pamphlets, or other written material from your doctor or pharmacy?: Never  Patient response YN:WGNFAO Isolation - How often do you feel lonely or isolated from those around you?: Never  Services provided included: MD, RD, PT, OT, SLP, RN, CM, TR, Pharmacy, SW, Neuropsych  Financial Services:  Field seismologist Utilized: Medicare    Choices offered to/list presented to: patient  Follow-up services arranged:  Home Health, DME Home Health Agency: [facility]    DME : Hb, Wc, RW, BSC, TTB    Patient response to transportation need: Is the patient able to respond to transportation needs?: Yes In the past 12 months, has lack of transportation kept you from medical appointments or from getting medications?: No In the past 12 months, has lack of transportation kept you from meetings, work, or from getting things needed for daily living?: No   Patient/Family verbalized understanding of follow-up arrangements:  Yes  Individual responsible for coordination of the follow-up plan: Brother, [phone]  Confirmed correct DME delivered: Andria Rhein 04/09/2023    Comments (or additional information):  Summary of Stay    Date/Time Discharge Planning CSW  03/31/23 1212 Discharging home with assistance from sister's boyfriend and friends. Level entry apartment. CJB  03/25/23 1338 New admission, assesment pending CJB       Andria Rhein

## 2023-04-09 NOTE — Progress Notes (Signed)
Contacted [facility] SW to confirm pt's d/c was today and that pt should resume care tomorrow.   Olivia Canter Renal Navigator [phone]

## 2023-04-09 NOTE — Discharge Summary (Signed)
Physician Discharge Summary  Patient ID: Colleen [name] MRN: [id number] DOB/AGE: September 10, 1961 62 y.o.  Admit date: 03/24/2023 Discharge date: 04/09/2023  Discharge Diagnoses:  Principal Problem:   Debility Active Problems:   Swelling of lower extremity   Long term (current) use of anticoagulants   Facial palsy as birth trauma   Anemia, chronic disease   ESRD on dialysis (HCC)   Pressure injury of skin   S/P MVR (mitral valve repair)   Discharged Condition: stable  Significant Diagnostic Studies: DG Chest 2 View  Result Date: 03/27/2023 CLINICAL DATA:  Shortness of breath. EXAM: CHEST - 2 VIEW COMPARISON:  CTA chest 12/08/2020.  One-view chest x-ray 12/08/2020. FINDINGS: The heart is enlarged. Monitoring device is in place. Atherosclerotic calcifications are present at the aortic arch. Aortic valve is noted. A right pleural effusion is present. Asymmetric interstitial edema is present on the right. Dependent airspace disease is noted in the lower lobe. Mild pulmonary vascular congestion is present on the left without focal airspace disease. Surgical clips are present in the right axilla. The visualized soft tissues and bony thorax are otherwise unremarkable. IMPRESSION: 1. Cardiomegaly with asymmetric interstitial edema and right pleural effusion compatible with congestive heart failure. 2. Dependent airspace disease in the right lower lobe likely reflects atelectasis. Infection is not excluded. Electronically Signed   By: Marin Roberts M.D.   On: 03/27/2023 10:41    Labs:  Basic Metabolic Panel:    Latest Ref Rng & Units 04/07/2023   12:29 PM 04/03/2023    1:00 PM 04/01/2023    2:00 PM  BMP  Glucose 70 - 99 mg/dL 409  811  83   BUN 6 - 20 mg/dL 52  36  12   Creatinine 0.44 - 1.00 mg/dL 9.14  7.82  9.56   Sodium 135 - 145 mmol/L 132  132  135   Potassium 3.5 - 5.1 mmol/L 3.6  4.3  3.0   Chloride 98 - 111 mmol/L 90  91  94   CO2 22 - 32 mmol/L 23  22  27    Calcium 8.9 - 10.3  mg/dL 8.0  7.8  7.7      CBC: Recent Labs  Lab 04/03/23 1300 04/06/23 0547 04/07/23 1229  WBC 11.2* 9.0 9.6  HGB 10.4* 10.6* 10.1*  HCT 32.6* 33.6* 32.1*  MCV 88.8 89.6 88.2  PLT 233 170 173    CBG: Recent Labs  Lab 04/08/23 1208 04/08/23 1814 04/08/23 2136 04/09/23 0042 04/09/23 0616  GLUCAP 216* 77 83 138* 82    Protime: Lab Results  Component Value Date   INR 3.4 (H) 04/09/2023   INR 2.7 (H) 04/07/2023   INR 2.6 (H) 04/06/2023    Vitals:   04/08/23 1824 04/09/23 0536  BP: (!) 101/57 (!) 102/56  Pulse: 91 92  Resp: 17 19  Temp: (!) 97.5 F (36.4 C) 97.6 F (36.4 C)  SpO2: 99% 100%     Brief HPI:   Colleen [name] is a 62 y.o. RH-female with history of T2DM with retinopathy and nephropathy, ESRD-HD MWF, DVT/PE in the past, CN VIII palsy, severe MVR with moderate to severe stenosis and TVR who was admitted to [facility] on 02/19/23 for MVR with valvuloplasaty TV with ring insertion and removal of LV mass by Dr. Felipa Furnace. Hospital course significant for significant post op hypotension which required stress dose steroids as well as addition of Midodrine, Droxidopa and Straterra for BP control. Angioedema resolved with sue of decadron X 24  hours, enteral feeds added due to poor po intake and leucocytosis treated with 7 day course  IV cefepime. Delirium and agitation felt to be due to narcotics as well as sedating medications which were d/c.   She developed lethargy with confusion and apraxia on 03/21 which neurology felt was related to delirium, orthostasis and polypharmacy. Imaging was deferred as symptoms resolved. Po intake was improving and diet was advanced to regular with regular insulin for meal coverage per endocrine input. She continued to have issues with overload with 2-3+ peripheral edema, intermittent SOB requiring supplemental oxygen, anxiety as well as fatigue. Therapy was working with patient who was limited by weakness, SOB and sternal precautions. CIR was  recommended by rehab team. [facility] Chart was reviewed rehab admissions coordinator and physiatrist and patient felt to appropriate for comprehensive rehab program.    Hospital Course: RABAB MERRITTS was admitted to rehab 03/24/2023 for inpatient therapies to consist of PT, ST and OT at least three hours five days a week. Past admission physiatrist, therapy team and rehab RN have worked together to provide customized collaborative inpatient rehab. She was noted to be fluid overload and maintained on supplemental oxygen initially due to reports of SOB and PND. CXR done showed evidence of CHF and addition dialysis sessions discussed which patient declined. She also refused to run > 3 hrs HD sessions. She was finally agreeable to extra session but this was unable to be done due to high dialysis census. She also continued to have low BP with hypotension in HD therefore low temp and albumin used per protocol. She has been transitioned to her MWF schedule at discharge.   Her weight is down from 165.5 lbs at admission to 140.8 lbs at discharge.   Blood pressures were monitored on TID basis and continued to be on low side but she has been asymptomatic. Prior authorization done for droxidopa which was denied therefore this was d/c 48 hours prior to d/c and her blood pressures have been stable off it. Due to reports of poor intake, diet was limited to low salt with moderate carb restrictions. Her phosphorous levels have been reasonable and no binders needed during her stay. Her diabetes has been monitored with ac/hs CBG checks and SSI was use prn for tighter BS control. Her intake has gradually been improving. BS have been variable during her stay. Novolog was discontinued and she was started on Lantus with BS gradually improving. She was advised to monitor her BS 3-4 times a day and follow up with PCP for further adjustment.   Her anxiety levels have improved and she has been weaned off oxygen. She did have nose bleed  a couple of times due to elevated INR and oxygen use. She has been advised to use saline nasal spray frequently to keep nasal mucosa hydrated. Sternal incision is C/D/I and is healing well without S/S of infection. Peripheral edema has greatly improved. PharmD has been assisting in management and monitoring of coumadin dose.  INR has been affected by wt loss as well as intake. Appointment set for protime check on 05/07 at PCP's comadin clinic and dose adjusted to  4mg  TTSS and 2.5 mg MWF as protime jumped to 3.4 at discharge. She has been educated on low K diet as well as monitoring for any signs of bleeding. Family has been instructed on assisting patient with medication management.  Her gains were limited by generalized weakness, refusal to attempt certain functional tasks and self limiting behaviors. [facility]  will continue to provide HHPT, HHOT, HHST and HHRN for follow up after discharge.    Rehab course: During patient's stay in rehab weekly team conferences were held to monitor patient's progress, set goals and discuss barriers to discharge. At admission, patient required total assist with basic self care tasks and mod assist with mobility. She  has had improvement in activity tolerance, balance, postural control as well as ability to compensate for deficits. She requires min to mod assist with ADL tasks.  Patient and mother have been instructed on need for hands on physical assistance with ADL tasks. She requires mod assist for sit to supine,min assist for sit to stand and min assist to ambulate 73' with RW.  Family education not done as patient did not allow family to be contacted for this.  Disposition: Home  Diet: Carb Modified/ Renal diet. Limit fluids to 5 cups per day.   Special Instructions: Protime to be checked on Tuesday, May 7th at noon. Coumadin clinic at [facility] office. Continue sternal precautions for 12 weeks. Monitor BS 3-4 times a day and follow up with Dr.  Thomasena Edis for titration of insulin.    Allergies as of 04/09/2023       Reactions   Farxiga [dapagliflozin] Other (See Comments)   Cannot take due to kidneys   Hydrocodone Other (See Comments)   "Increase Glucose level," per pt   Kombiglyze Xr [saxagliptin-metformin Er] Other (See Comments)   Cannot take due to kidneys        Medication List     STOP taking these medications    droxidopa 100 MG Caps Commonly known as: NORTHERA   ferric citrate 1 GM 210 MG(Fe) tablet Commonly known as: AURYXIA   insulin regular 100 units/mL injection Commonly known as: NOVOLIN R   lanthanum 1000 MG chewable tablet Commonly known as: FOSRENOL   NovoLOG Mix 70/30 FlexPen (70-30) 100 UNIT/ML FlexPen Generic drug: insulin aspart protamine - aspart   pantoprazole 40 MG tablet Commonly known as: PROTONIX   sitaGLIPtin 25 MG tablet Commonly known as: JANUVIA       TAKE these medications    amiodarone 200 MG tablet Commonly known as: PACERONE Take 1 tablet (200 mg total) by mouth daily.   Aspirin Low Dose 81 MG chewable tablet Generic drug: aspirin Chew 1 tablet (81 mg total) by mouth daily. What changed: how much to take   atomoxetine 18 MG capsule Commonly known as: STRATTERA Take 1 capsule (18 mg total) by mouth daily. What changed:  medication strength how much to take   Basaglar KwikPen 100 UNIT/ML Inject 8 Units into the skin daily. Pen expires 28 days after first use What changed:  how much to take when to take this   cinacalcet 30 MG tablet Commonly known as: SENSIPAR Take 60 mg by mouth every Monday, Wednesday, and Friday.   Deep Sea Nasal Spray 0.65 % nasal spray Generic drug: sodium chloride Place 1 spray into both nostrils as needed for congestion.   Heartburn Relief 10 MG tablet Generic drug: famotidine Take 1 tablet (10 mg total) by mouth daily.   Insulin Pen Needle 32G X 4 MM Misc 1 Application by Does not apply route at bedtime.   leptospermum  manuka honey Pste paste Apply 1 Application topically daily. To left elbow wound and cover with dry dressing.   melatonin 5 MG Tabs Take 2 tablets (10 mg total) by mouth at bedtime.   midodrine 10 MG tablet Commonly known as: PROAMATINE Take  2 tablets (20 mg total) by mouth every 8 (eight) hours. New medication. On discharge orders from [facility] 03/24/23.   multivitamin Tabs tablet Take 1 tablet by mouth at bedtime. What changed: medication strength   polycarbophil 625 MG tablet Commonly known as: FIBERCON Take 1 tablet (625 mg total) by mouth daily.   simethicone 80 MG chewable tablet Commonly known as: MYLICON Chew 1 tablet (80 mg total) by mouth 4 (four) times daily.   Vitamin D (Ergocalciferol) 1.25 MG (50000 UNIT) Caps capsule Commonly known as: DRISDOL Take 1 capsule (50,000 Units total) by mouth every 7 (seven) days. Start taking on: Apr 15, 2023   warfarin 4 MG tablet Commonly known as: COUMADIN Take with supper--take 1/2 tablet on Mon,Wed and Friday. Take whole tablet on Tue, Thus, Sat, Sun. What changed:  medication strength how much to take how to take this when to take this additional instructions        Follow-up Information     Irena Reichmann, DO Follow up on 04/14/2023.   Specialty: Family Medicine Why: You have appointment with Dr. Adelina Mings at coumadin clinic (Fax # [phone]) at noon for blood draw.  . (You will also need to call the office on Friday for appoint ment with Dr. Thomasena Edis for post hospital follow up.) Contact information: [address] [place] [address] [phone]         Horton Chin, MD Follow up.   Specialty: Physical Medicine and Rehabilitation Contact information: [address] [place] [address] [phone]         Annie Sable, MD Follow up.   Specialty: Nephrology Contact information: [address] [place] [address] [phone]         [facility] [place] [facility] Go on 04/10/2023.   Why: Schedule is Monday/Wednesday/Friday. Arrive at 5:30 am for 5:45 am chair time. Contact information: [address] [place] [address] [phone]         Nolon Rod, MD Follow up on 05/01/2023.   Specialty: Internal Medicine Contact information: [address] [place] [address] [phone]         Kathrene Alu, MD Follow up.   Specialty: Cardiothoracic Surgery Why: [phone] [address], [place], [place]                Signed: Jacquelynn Cree 04/09/2023, 5:49 PM

## 2023-04-09 NOTE — Progress Notes (Signed)
INPATIENT REHABILITATION DISCHARGE NOTE   Discharge instructions by: Marissa Nestle, PA  Verbalized understanding: Yes  Skin care/Wound care healing? Yes  Pain: none  IV's: removed  Tubes/Drains: none  O2: none  Safety instructions: Reviewed with pt and family  Patient belongings: sent with pt and family  Discharged to: home  Discharged via: family transport  Notes: done  Marylu Lund, Charity fundraiser

## 2023-04-09 NOTE — Progress Notes (Signed)
Inpatient Rehabilitation Discharge Medication Review by a Pharmacist  A complete drug regimen review was completed for this patient to identify any potential clinically significant medication issues.  High Risk Drug Classes Is patient taking? Indication by Medication  Antipsychotic No   Anticoagulant Yes Warfarin - mechanical MVR  Antibiotic No   Opioid No   Antiplatelet No   Hypoglycemics/insulin No Insulin - DM  Vasoactive Medication Yes Amiodarone - Afib Midodrine - low BP  Chemotherapy No   Other Yes Atomoxetine - attention Cinacalect - ESRD Famotidine - Reflux Vitamin D - bones     Type of Medication Issue Identified Description of Issue Recommendation(s)  Drug Interaction(s) (clinically significant)     Duplicate Therapy     Allergy     No Medication Administration End Date     Incorrect Dose     Additional Drug Therapy Needed     Significant med changes from prior encounter (inform family/care partners about these prior to discharge).    Other       Clinically significant medication issues were identified that warrant physician communication and completion of prescribed/recommended actions by midnight of the next day:  No  Name of provider notified for urgent issues identified:   Provider Method of Notification:     Pharmacist comments: INR appt on Tuesday  Time spent performing this drug regimen review (minutes):  20 minutes   Thank you Okey Regal, PharmD

## 2023-04-09 NOTE — Plan of Care (Signed)
[facility] Patient Discharge Orders- [facility] CLINIC: [facility]  Patient's name: Colleen [name] Admit/DC Dates: 03/24/2023 - 04/09/2023  Discharge Diagnoses:  Functional deficits secondary to debility after MV repair with sternal precautions  Mechanical MVR   Aranesp: Given: Yes   Date and amount of last dose:  Last Hgb: 10.1 PRBC's Given: No Date/# of units: N/A ESA dose for discharge: mircera 100 units IV q 2 weeks  IV Iron dose at discharge: None  Heparin change: yes- no heparin (on coumadin now)  EDW Change: Yes New EDW: 63kg  Bath Change: No  Access intervention/Change: No Details:  Hectorol/Calcitriol change: No  Discharge Labs: Calcium: 8.0 Phosphorus: 5.4 Albumin: 2.4 K+: 3.6  IV Antibiotics: No Details:  On Coumadin?: Yes Last INR: 3.4 Next INR: 04/14/23 Managed By: Redge Gainer, has INR appt on Tuesday   OTHER/APPTS/LAB ORDERS:    D/C Meds to be reconciled by nurse after every discharge.  Completed By: Rogers Blocker, PA-C 04/09/2023, 10:52 AM  [facility] Pager: [phone]  Reviewed by: MD:______ RN_______

## 2023-04-10 ENCOUNTER — Emergency Department (HOSPITAL_BASED_OUTPATIENT_CLINIC_OR_DEPARTMENT_OTHER): Payer: Medicare Other

## 2023-04-10 ENCOUNTER — Inpatient Hospital Stay (HOSPITAL_BASED_OUTPATIENT_CLINIC_OR_DEPARTMENT_OTHER)
Admission: EM | Admit: 2023-04-10 | Discharge: 2023-04-12 | DRG: 291 | Disposition: A | Discharge status: Home Health | Payer: Medicare Other | Source: Other Acute Inpatient Hospital | Attending: Family Medicine | Admitting: Family Medicine

## 2023-04-10 ENCOUNTER — Ambulatory Visit
Admission: EM | Admit: 2023-04-10 | Discharge: 2023-04-10 | Disposition: A | Discharge status: Home / Self Care | Payer: Medicare Other

## 2023-04-10 ENCOUNTER — Encounter (HOSPITAL_BASED_OUTPATIENT_CLINIC_OR_DEPARTMENT_OTHER): Payer: Self-pay | Admitting: Urology

## 2023-04-10 ENCOUNTER — Other Ambulatory Visit: Payer: Self-pay

## 2023-04-10 DIAGNOSIS — I132 Hypertensive heart and chronic kidney disease with heart failure and with stage 5 chronic kidney disease, or end stage renal disease: Principal | ICD-10-CM | POA: Diagnosis present

## 2023-04-10 DIAGNOSIS — I442 Atrioventricular block, complete: Secondary | ICD-10-CM | POA: Insufficient documentation

## 2023-04-10 DIAGNOSIS — N186 End stage renal disease: Secondary | ICD-10-CM | POA: Diagnosis present

## 2023-04-10 DIAGNOSIS — E871 Hypo-osmolality and hyponatremia: Secondary | ICD-10-CM | POA: Diagnosis present

## 2023-04-10 DIAGNOSIS — F40240 Claustrophobia: Secondary | ICD-10-CM | POA: Diagnosis present

## 2023-04-10 DIAGNOSIS — E1122 Type 2 diabetes mellitus with diabetic chronic kidney disease: Secondary | ICD-10-CM | POA: Diagnosis present

## 2023-04-10 DIAGNOSIS — E876 Hypokalemia: Secondary | ICD-10-CM | POA: Insufficient documentation

## 2023-04-10 DIAGNOSIS — F909 Attention-deficit hyperactivity disorder, unspecified type: Secondary | ICD-10-CM | POA: Diagnosis present

## 2023-04-10 DIAGNOSIS — Z9889 Other specified postprocedural states: Secondary | ICD-10-CM

## 2023-04-10 DIAGNOSIS — E1165 Type 2 diabetes mellitus with hyperglycemia: Secondary | ICD-10-CM

## 2023-04-10 DIAGNOSIS — Z86718 Personal history of other venous thrombosis and embolism: Secondary | ICD-10-CM

## 2023-04-10 DIAGNOSIS — Z794 Long term (current) use of insulin: Secondary | ICD-10-CM

## 2023-04-10 DIAGNOSIS — Z7901 Long term (current) use of anticoagulants: Secondary | ICD-10-CM

## 2023-04-10 DIAGNOSIS — I959 Hypotension, unspecified: Secondary | ICD-10-CM | POA: Diagnosis present

## 2023-04-10 DIAGNOSIS — I272 Pulmonary hypertension, unspecified: Secondary | ICD-10-CM | POA: Diagnosis present

## 2023-04-10 DIAGNOSIS — J9601 Acute respiratory failure with hypoxia: Secondary | ICD-10-CM | POA: Diagnosis present

## 2023-04-10 DIAGNOSIS — D638 Anemia in other chronic diseases classified elsewhere: Secondary | ICD-10-CM | POA: Diagnosis present

## 2023-04-10 DIAGNOSIS — E785 Hyperlipidemia, unspecified: Secondary | ICD-10-CM | POA: Diagnosis present

## 2023-04-10 DIAGNOSIS — Z885 Allergy status to narcotic agent status: Secondary | ICD-10-CM

## 2023-04-10 DIAGNOSIS — D631 Anemia in chronic kidney disease: Secondary | ICD-10-CM | POA: Diagnosis present

## 2023-04-10 DIAGNOSIS — Z992 Dependence on renal dialysis: Secondary | ICD-10-CM

## 2023-04-10 DIAGNOSIS — Z7982 Long term (current) use of aspirin: Secondary | ICD-10-CM

## 2023-04-10 DIAGNOSIS — Z79890 Hormone replacement therapy (postmenopausal): Secondary | ICD-10-CM

## 2023-04-10 DIAGNOSIS — Z79899 Other long term (current) drug therapy: Secondary | ICD-10-CM

## 2023-04-10 DIAGNOSIS — I5033 Acute on chronic diastolic (congestive) heart failure: Secondary | ICD-10-CM | POA: Insufficient documentation

## 2023-04-10 DIAGNOSIS — Z86711 Personal history of pulmonary embolism: Secondary | ICD-10-CM

## 2023-04-10 DIAGNOSIS — R040 Epistaxis: Secondary | ICD-10-CM | POA: Diagnosis present

## 2023-04-10 DIAGNOSIS — Z833 Family history of diabetes mellitus: Secondary | ICD-10-CM

## 2023-04-10 DIAGNOSIS — Z952 Presence of prosthetic heart valve: Secondary | ICD-10-CM

## 2023-04-10 DIAGNOSIS — E8770 Fluid overload, unspecified: Secondary | ICD-10-CM

## 2023-04-10 DIAGNOSIS — Z950 Presence of cardiac pacemaker: Secondary | ICD-10-CM

## 2023-04-10 DIAGNOSIS — Z888 Allergy status to other drugs, medicaments and biological substances status: Secondary | ICD-10-CM

## 2023-04-10 DIAGNOSIS — I480 Paroxysmal atrial fibrillation: Secondary | ICD-10-CM | POA: Insufficient documentation

## 2023-04-10 DIAGNOSIS — R262 Difficulty in walking, not elsewhere classified: Secondary | ICD-10-CM | POA: Diagnosis present

## 2023-04-10 DIAGNOSIS — R0902 Hypoxemia: Secondary | ICD-10-CM | POA: Diagnosis present

## 2023-04-10 LAB — BRAIN NATRIURETIC PEPTIDE: B Natriuretic Peptide: 2806.8 pg/mL — ABNORMAL HIGH (ref 0.0–100.0)

## 2023-04-10 LAB — COMPREHENSIVE METABOLIC PANEL
ALT: 18 U/L (ref 0–44)
AST: 61 U/L — ABNORMAL HIGH (ref 15–41)
Albumin: 3 g/dL — ABNORMAL LOW (ref 3.5–5.0)
Alkaline Phosphatase: 93 U/L (ref 38–126)
Anion gap: 14 (ref 5–15)
BUN: 25 mg/dL — ABNORMAL HIGH (ref 6–20)
CO2: 28 mmol/L (ref 22–32)
Calcium: 8.1 mg/dL — ABNORMAL LOW (ref 8.9–10.3)
Chloride: 92 mmol/L — ABNORMAL LOW (ref 98–111)
Creatinine, Ser: 3.96 mg/dL — ABNORMAL HIGH (ref 0.44–1.00)
GFR, Estimated: 12 mL/min — ABNORMAL LOW (ref >60)
Glucose, Bld: 257 mg/dL — ABNORMAL HIGH (ref 70–99)
Potassium: 4.5 mmol/L (ref 3.5–5.1)
Sodium: 134 mmol/L — ABNORMAL LOW (ref 135–145)
Total Bilirubin: 1.2 mg/dL (ref 0.3–1.2)
Total Protein: 8.6 g/dL — ABNORMAL HIGH (ref 6.5–8.1)

## 2023-04-10 LAB — TROPONIN I (HIGH SENSITIVITY): Troponin I (High Sensitivity): 55 ng/L — ABNORMAL HIGH (ref <18)

## 2023-04-10 LAB — CBC WITH DIFFERENTIAL/PLATELET
Abs Immature Granulocytes: 0.09 10*3/uL — ABNORMAL HIGH (ref 0.00–0.07)
Basophils Absolute: 0.1 10*3/uL (ref 0.0–0.1)
Basophils Relative: 1 %
Eosinophils Absolute: 0 10*3/uL (ref 0.0–0.5)
Eosinophils Relative: 0 %
HCT: 35.3 % — ABNORMAL LOW (ref 36.0–46.0)
Hemoglobin: 10.8 g/dL — ABNORMAL LOW (ref 12.0–15.0)
Immature Granulocytes: 1 %
Lymphocytes Relative: 11 %
Lymphs Abs: 0.9 10*3/uL (ref 0.7–4.0)
MCH: 27.6 pg (ref 26.0–34.0)
MCHC: 30.6 g/dL (ref 30.0–36.0)
MCV: 90.3 fL (ref 80.0–100.0)
Monocytes Absolute: 1.1 10*3/uL — ABNORMAL HIGH (ref 0.1–1.0)
Monocytes Relative: 13 %
Neutro Abs: 6.6 10*3/uL (ref 1.7–7.7)
Neutrophils Relative %: 74 %
Platelets: 200 10*3/uL (ref 150–400)
RBC: 3.91 MIL/uL (ref 3.87–5.11)
RDW: 18.9 % — ABNORMAL HIGH (ref 11.5–15.5)
WBC: 8.8 10*3/uL (ref 4.0–10.5)
nRBC: 0.9 % — ABNORMAL HIGH (ref 0.0–0.2)

## 2023-04-10 LAB — PROTIME-INR
INR: 3.4 — ABNORMAL HIGH (ref 0.8–1.2)
Prothrombin Time: 34.3 seconds — ABNORMAL HIGH (ref 11.4–15.2)

## 2023-04-10 MED ORDER — OXYMETAZOLINE HCL 0.05 % NA SOLN
1.0000 | Freq: Once | NASAL | Status: AC
  Administered 2023-04-11: 1 via NASAL
  Filled 2023-04-10: qty 30

## 2023-04-10 NOTE — ED Notes (Signed)
RN at bedside, pt has bleeding from R nare. RN applying pressure. Pt  SpO2 on RA 84-90%, RT notified.

## 2023-04-10 NOTE — ED Triage Notes (Addendum)
Nose started bleeding today after dialysis. Has tried saline spray. Pt has recently had heart surgery and a pacemaker placed and has started coumadin.

## 2023-04-10 NOTE — ED Triage Notes (Addendum)
[facility] sent you due to BP being low Just d/c from hospital yesterday post of CABG and was started on blood thinners  Reports epistaxis that started today as chief complaint at UC earlier  Denies any pain at this time  Denies weakness or dizziness

## 2023-04-10 NOTE — ED Notes (Signed)
Pt SpO2 decreased to 82% on RA, RT at bedside to place O2 back on pt. Pt c/o of  post nasal drip , pt coughing up some blood tinged mucus. Pt given bedside suction to use as needed.

## 2023-04-10 NOTE — ED Provider Notes (Signed)
[place] EMERGENCY DEPARTMENT AT [facility] Provider Note  CSN: [id number] Arrival date & time: 04/10/23 2008  Chief Complaint(s) Hypotension  HPI Colleen [name] is a 62 y.o. female with history of diabetes, end-stage renal disease on Monday, Wednesday, Friday dialysis, hyperlipidemia, prior pulmonary embolism on warfarin presenting to the emergency department with nosebleed.  Patient reports that she has had a nosebleed starting today.  Mainly right nare.  Seems to have stopped currently.  Denies any nose picking or other insertion.  She went to urgent care where they found she was hypoxic and borderline hypotensive so she was referred to the emergency department.  Patient recently hospitalized for valve replacement and had tricuspid, aortic, and mitral valve replacements.  She was in cardiac rehab for 1 month.  She reports compliance with her home medications.  She reports she does not feel short of breath but on further questioning reports she always feels short of breath and cannot lie flat.  She reports that her leg swelling is improved from recent hospitalization.  She does not use oxygen at home.  She denies any fevers or chills.  She reports some chronic cough which has been present since her hospitalization 2 months ago.   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 face since birth   Retinopathy    Was going blind and had surgery with improvement   Patient Active Problem List   Diagnosis Date Noted   Debility 03/24/2023   Pressure injury of  skin 03/24/2023   S/P MVR (mitral valve repair) 03/24/2023   Mitral valve vegetation    Acute pulmonary edema (HCC) 12/09/2020   Anemia, chronic disease 12/09/2020   Nausea vomiting and diarrhea 12/09/2020   ESRD on dialysis (HCC) 12/09/2020   Fluid overload 12/09/2020   Acute respiratory failure with hypoxia (HCC) 12/08/2020   Adenomatous polyp of ascending colon    Adenomatous polyp of descending colon    Cecal polyp    Colon cancer screening    Grade I hemorrhoids    Melanosis coli    Polyp of sigmoid colon    Rectal polyp    Acute on chronic kidney failure (HCC) 12/31/2018   Uremia 12/31/2018   Hyperkalemia, diminished renal excretion 12/31/2018   Metabolic acidosis, NAG, failure of bicarbonate regeneration 12/31/2018   Long term (current) use of anticoagulants 09/07/2018   DVT (deep venous thrombosis) (HCC) 08/24/2018   Blindness of left eye 08/24/2018   Acute kidney injury superimposed on CKD (HCC) 06/06/2018   Left knee pain 06/06/2018   Anemia due to chronic kidney disease 06/06/2018   Hyponatremia 06/06/2018   HLD (hyperlipidemia) 06/05/2018   Hyperglycemia 06/05/2018   Gout 06/05/2018   Swelling of lower extremity 05/28/2018   CKD (chronic kidney disease) stage 5, GFR less than 15 ml/min (HCC) 05/28/2018   Hypochromic microcytic anemia 05/28/2018   Lower extremity edema 05/28/2018   Callosity 12/11/2015   Paronychia of  right middle finger 10/10/2015   Coma of unknown cause (HCC) 10/01/2015   Facial palsy as birth trauma 10/01/2015   Essential hypertension 10/06/2013   Pulmonary embolism (HCC) 10/06/2013   Congestive dilated cardiomyopathy (HCC) 10/05/2013   Carotid bruit 10/05/2013   DM (diabetes mellitus), type 2, uncontrolled 10/04/2013   Type 2 diabetes mellitus with hyperglycemia, with long-term current use of insulin (HCC) 10/04/2013   Acute renal insufficiency 09/27/2013   Septic shock(785.52) 09/25/2013   Bacteremia 09/25/2013   Home  Medication(s) Prior to Admission medications   Medication Sig Start Date End Date Taking? Authorizing Provider  amiodarone (PACERONE) 200 MG tablet Take 1 tablet (200 mg total) by mouth daily. 04/08/23   Love, Evlyn Kanner, PA-C  aspirin 81 MG chewable tablet Chew 1 tablet (81 mg total) by mouth daily. 04/08/23   Love, Evlyn Kanner, PA-C  atomoxetine (STRATTERA) 18 MG capsule Take 1 capsule (18 mg total) by mouth daily. 04/09/23   Love, Evlyn Kanner, PA-C  multivitamin (RENA-VIT) TABS tablet Take 1 tablet by mouth at bedtime. 04/08/23   Jacquelynn Cree, PA-C  cinacalcet (SENSIPAR) 30 MG tablet Take 60 mg by mouth every Monday, Wednesday, and Friday.    [provider]  famotidine (PEPCID) 10 MG tablet Take 1 tablet (10 mg total) by mouth daily. 04/08/23   Love, Evlyn Kanner, PA-C  Insulin Glargine (BASAGLAR KWIKPEN) 100 UNIT/ML Inject 8 Units into the skin daily. Pen expires 28 days after first use 04/08/23   Love, Evlyn Kanner, PA-C  Insulin Pen Needle 32G X 4 MM MISC 1 Application by Does not apply route at bedtime. 04/09/23   Love, Evlyn Kanner, PA-C  leptospermum manuka honey (MEDIHONEY) PSTE paste Apply 1 Application topically daily. To left elbow wound and cover with dry dressing. 04/09/23   Love, Evlyn Kanner, PA-C  melatonin 5 MG TABS Take 2 tablets (10 mg total) by mouth at bedtime. 04/08/23   Love, Evlyn Kanner, PA-C  midodrine (PROAMATINE) 10 MG tablet Take 2 tablets (20 mg total) by mouth every 8 (eight) hours. New medication. On discharge orders from [facility] 03/24/23. 04/08/23   Love, Evlyn Kanner, PA-C  polycarbophil (FIBERCON) 625 MG tablet Take 1 tablet (625 mg total) by mouth daily. 04/09/23   Love, Evlyn Kanner, PA-C  simethicone (MYLICON) 80 MG chewable tablet Chew 1 tablet (80 mg total) by mouth 4 (four) times daily. 04/08/23   Love, Evlyn Kanner, PA-C  sodium chloride (OCEAN) 0.65 % SOLN nasal spray Place 1 spray into both nostrils as needed for congestion. 04/08/23   Love, Evlyn Kanner, PA-C  Vitamin D, Ergocalciferol, (DRISDOL) 1.25 MG (50000  UNIT) CAPS capsule Take 1 capsule (50,000 Units total) by mouth every 7 (seven) days. 04/15/23   Love, Evlyn Kanner, PA-C  warfarin (COUMADIN) 4 MG tablet Take with supper--take 1/2 tablet on Mon,Wed and Friday. Take whole tablet on Tue, Thus, Sat, Sun. 04/09/23   Love, Evlyn Kanner, PA-C  Past Surgical History Past Surgical History:  Procedure Laterality Date   A/V FISTULAGRAM Left 07/29/2019   Procedure: A/V FISTULAGRAM;  Surgeon: Sherren Kerns, MD;  Location: [facility] INVASIVE CV LAB;  Service: Cardiovascular;  Laterality: Left;   ARTERY EXPLORATION Left 06/14/2019   Procedure: Artery Exploration Left Upper Arm;  Surgeon: Sherren Kerns, MD;  Location: [facility] OR;  Service: Vascular;  Laterality: Left;   AV FISTULA PLACEMENT Left 12/31/2018   Procedure: ARTERIOVENOUS (AV) FISTULA CREATION VERSUS INSERTION OF ARTERIOVENOUS GRAFT LEFT ARM;  Surgeon: Sherren Kerns, MD;  Location: [facility] OR;  Service: Vascular;  Laterality: Left;   BASCILIC VEIN TRANSPOSITION Left 06/14/2019   Procedure: SECOND STAGE BASILIC VEIN TRANSPOSITION LEFT ARM;  Surgeon: Sherren Kerns, MD;  Location: [facility] OR;  Service: Vascular;  Laterality: Left;   BIOPSY  03/05/2020   Procedure: BIOPSY;  Surgeon: Shellia Cleverly, DO;  Location: [facility] ENDOSCOPY;  Service: Gastroenterology;;   COLONOSCOPY WITH PROPOFOL N/A 03/05/2020   Procedure: COLONOSCOPY WITH PROPOFOL;  Surgeon: Shellia Cleverly, DO;  Location: [facility] ENDOSCOPY;  Service: Gastroenterology;  Laterality: N/A;   EYE SURGERY     9 total eye surgery including laser and cararact surgery   HEMOSTASIS CLIP PLACEMENT  03/05/2020   Procedure: HEMOSTASIS CLIP PLACEMENT;  Surgeon: Shellia Cleverly, DO;  Location: [facility] ENDOSCOPY;  Service: Gastroenterology;;   INSERTION OF DIALYSIS CATHETER N/A 12/31/2018   Procedure: INSERTION OF TUNNELED  DIALYSIS CATHETER;  Surgeon:  Sherren Kerns, MD;  Location: [facility] OR;  Service: Vascular;  Laterality: N/A;   PERIPHERAL VASCULAR BALLOON ANGIOPLASTY Left 07/29/2019   Procedure: PERIPHERAL VASCULAR BALLOON ANGIOPLASTY;  Surgeon: Sherren Kerns, MD;  Location: [facility] INVASIVE CV LAB;  Service: Cardiovascular;  Laterality: Left;  ARM FISTULA   POLYPECTOMY  03/05/2020   Procedure: POLYPECTOMY;  Surgeon: Shellia Cleverly, DO;  Location: [facility] ENDOSCOPY;  Service: Gastroenterology;;   TEE WITHOUT CARDIOVERSION N/A 12/12/2020   Procedure: TRANSESOPHAGEAL ECHOCARDIOGRAM (TEE);  Surgeon: Wendall Stade, MD;  Location: [facility] ENDOSCOPY;  Service: Cardiovascular;  Laterality: N/A;   Family History Family History  Problem Relation Age of Onset   Diabetes Father    Diabetes Maternal Aunt    CAD Neg Hx    Colon cancer Neg Hx    Esophageal cancer Neg Hx    Cancer Neg Hx     Social History Social History   Tobacco Use   Smoking status: Never   Smokeless tobacco: Never  Vaping Use   Vaping Use: Never used  Substance Use Topics   Alcohol use: No   Drug use: No   Allergies Farxiga [dapagliflozin], Hydrocodone, and Kombiglyze xr [saxagliptin-metformin er]  Review of Systems Review of Systems  All other systems reviewed and are negative.   Physical Exam Vital Signs  I have reviewed the triage vital signs BP 92/80   Pulse 95   Temp (!) 97.5 F (36.4 C) (Oral)   Resp (!) 27   Ht 5\' 4"  (1.626 m)   Wt 63.9 kg   LMP 12/08/2013   SpO2 100%   BMI 24.18 kg/m  Physical Exam Vitals and nursing note reviewed.  Constitutional:      General: She is not in acute distress.    Appearance: She is well-developed.  HENT:     Head: Normocephalic and atraumatic.     Comments: Chronic facial abnormality    Nose:     Comments: Dried blood in R nare.     Mouth/Throat:  Mouth: Mucous membranes are moist.     Comments: No active bleeding noted from posterior nasopharynx Eyes:     Pupils: Pupils are equal, round, and reactive to  light.  Cardiovascular:     Rate and Rhythm: Normal rate and regular rhythm.     Heart sounds: No murmur heard. Pulmonary:     Effort: Pulmonary effort is normal. Tachypnea present. No respiratory distress.     Breath sounds: Examination of the right-middle field reveals rales. Examination of the left-middle field reveals rales. Examination of the right-lower field reveals decreased breath sounds. Examination of the left-lower field reveals rales. Decreased breath sounds and rales present.  Abdominal:     General: Abdomen is flat.     Palpations: Abdomen is soft.     Tenderness: There is no abdominal tenderness.  Musculoskeletal:        General: No tenderness.     Right lower leg: Edema present.     Left lower leg: Edema present.  Skin:    General: Skin is warm and dry.     Comments: Midline sternotomy scar scabbed over. No erythema   Neurological:     General: No focal deficit present.     Mental Status: She is alert. Mental status is at baseline.  Psychiatric:        Mood and Affect: Mood normal.        Behavior: Behavior normal.     ED Results and Treatments Labs (all labs ordered are listed, but only abnormal results are displayed) Labs Reviewed  COMPREHENSIVE METABOLIC PANEL  CBC WITH DIFFERENTIAL/PLATELET  BRAIN NATRIURETIC PEPTIDE  PROTIME-INR  TROPONIN I (HIGH SENSITIVITY)                                                                                                                          Radiology No results found.  Pertinent labs & imaging results that were available during my care of the patient were reviewed by me and considered in my medical decision making (see MDM for details).  Medications Ordered in ED Medications - No data to display                                                                                                                                   Procedures Procedures  (including critical care time)  Medical Decision Making / ED  Course   MDM:  62 year old  female presenting initially for nosebleed.  Patient was found also to be hypoxic with mildly low blood pressure at urgent care and referred to the ER.  Nosebleed appears to have stopped.  Likely contributor is patient is on warfarin.  Will check PT/INR.  Will monitor closely.  Patient also noted to be hypoxic.  She denies any cough or fevers.  She does appear somewhat volume overloaded with bilateral lower extremity swelling.  She denies shortness of breath but does also seem mildly tachypneic and not always speaking in full sentences.  Her pulmonary exam is notable for bibasilar crackles and diminished breath sounds at the right base.  Will obtain chest x-ray.  Suspect most likely cause is volume overload in the setting of dialysis.  Less likely pneumonia, no fevers.  She does report a cough which she reports has been chronic since discharge from the hospital and has not worsened.  Patient has no chest pain to suggest pulmonary embolism and reports compliance with Coumadin, will check INR.  If subtherapeutic, may check D-dimer.  Given hypoxia anticipate patient may need to be admitted for dialysis.  Will reassess.      Additional history obtained: -Additional history obtained from {wsadditionalhistorian:28072} -External records from outside source obtained and reviewed including: Chart review including previous notes, labs, imaging, consultation notes including ***   Lab Tests: -I ordered, reviewed, and interpreted labs.   The pertinent results include:   Labs Reviewed  COMPREHENSIVE METABOLIC PANEL  CBC WITH DIFFERENTIAL/PLATELET  BRAIN NATRIURETIC PEPTIDE  PROTIME-INR  TROPONIN I (HIGH SENSITIVITY)    Notable for ***  EKG   EKG Interpretation  Date/Time:    Ventricular Rate:    PR Interval:    QRS Duration:   QT Interval:    QTC Calculation:   R Axis:     Text Interpretation:           Imaging Studies ordered: I ordered imaging studies  including *** On my interpretation imaging demonstrates *** I independently visualized and interpreted imaging. I agree with the radiologist interpretation   Medicines ordered and prescription drug management: No orders of the defined types were placed in this encounter.   -I have reviewed the patients home medicines and have made adjustments as needed   Consultations Obtained: I requested consultation with the ***,  and discussed lab and imaging findings as well as pertinent plan - they recommend: ***   Cardiac Monitoring: The patient was maintained on a cardiac monitor.  I personally viewed and interpreted the cardiac monitored which showed an underlying rhythm of: ***  Social Determinants of Health:  Diagnosis or treatment significantly limited by social determinants of health: {wssoc:28071}   Reevaluation: After the interventions noted above, I reevaluated the patient and found that their symptoms have {resolved/improved/worsened:23923::"improved"}  Co morbidities that complicate the patient evaluation  Past Medical History:  Diagnosis Date   Anemia    Diabetes (HCC)    type II   DVT (deep venous thrombosis) (HCC)    E-coli UTI    ESRD on hemodialysis (HCC)    MWF at [facility]   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    Gout 06/05/2018   History of blood transfusion    History of claustrophobia    HLD (hyperlipidemia) 06/05/2018   Hypertension    PE (pulmonary embolism)    Pulled muscle    pt has right sided facial droop from pulled muscle in face since birth   Retinopathy    Was going blind and  had surgery with improvement      Dispostion: Disposition decision including need for hospitalization was considered, and patient {wsdispo:28070::"discharged from emergency department."}    Final Clinical Impression(s) / ED Diagnoses Final diagnoses:  None     This chart was dictated using voice recognition software.  Despite best efforts to proofread,  errors can  occur which can change the documentation meaning.

## 2023-04-10 NOTE — ED Provider Notes (Signed)
Presents with a nosebleed that began after dialysis today.  It started trickling from both nostrils.  She tried a nasal saline without relief Takes coumadin  She had a mitral valve replacement on 3/14, was in cardiac rehab until yesterday  In urgent care blood pressure is slightly  hypotensive 99/63 She is satting 86% on room air Does not usually have oxygen requirement at home  There is some clotted blood in the right nare, not currently bleeding.   With her oxygen sats, blood pressure, recent hospitalization and surgery, nose bleed on coumadin, I have recommended she be evaluated in the emergency department. Requires higher level of care than urgent care can offer  Offered transport so we can start her on oxygen but she prefers to go by personal vehicle She is alert, oriented, no respiratory distress.  Friend will transport her to hospital down the road   Tamra Koos, Lurena Joiner, [place] 04/10/23 2021

## 2023-04-10 NOTE — Progress Notes (Signed)
20:45 5/03 20/24 Patient had epitaxis of the right nare and her SPO2 was 86%.  Placed patient on Aerosol Face Tent at 8 liters and FIO2 of 35% with humidity.  Patient's SPO2 was 100%.  21:23  04/10/2023 Weaned patient's Aerosol Face Tent to 5 liters and FIO2 of 28% with humidity.  Patient's SPO2 if currently 95%.  RT will continue to monitor.

## 2023-04-10 NOTE — ED Notes (Signed)
RN removed pt's oxygen per Suezanne Jacquet MD request

## 2023-04-10 NOTE — ED Notes (Addendum)
Patient is being discharged from the Urgent Care and sent to the Emergency Department via POV . Per Lurena Joiner PA, patient is in need of higher level of care due to hypoxia, hypotension, uncontrolled nosebleed while on coumadin. Patient is aware and verbalizes understanding of plan of care.  Vitals:   04/10/23 1938  BP: 99/63  Pulse: 98  Resp: 20  Temp: 98 F (36.7 C)  SpO2: (!) 86%

## 2023-04-11 ENCOUNTER — Encounter (HOSPITAL_COMMUNITY): Payer: Self-pay | Admitting: Internal Medicine

## 2023-04-11 DIAGNOSIS — Z7901 Long term (current) use of anticoagulants: Secondary | ICD-10-CM | POA: Diagnosis not present

## 2023-04-11 DIAGNOSIS — Z86718 Personal history of other venous thrombosis and embolism: Secondary | ICD-10-CM | POA: Diagnosis not present

## 2023-04-11 DIAGNOSIS — F40240 Claustrophobia: Secondary | ICD-10-CM | POA: Diagnosis present

## 2023-04-11 DIAGNOSIS — Z952 Presence of prosthetic heart valve: Secondary | ICD-10-CM | POA: Diagnosis not present

## 2023-04-11 DIAGNOSIS — I480 Paroxysmal atrial fibrillation: Secondary | ICD-10-CM | POA: Diagnosis present

## 2023-04-11 DIAGNOSIS — I5033 Acute on chronic diastolic (congestive) heart failure: Secondary | ICD-10-CM | POA: Diagnosis present

## 2023-04-11 DIAGNOSIS — I959 Hypotension, unspecified: Secondary | ICD-10-CM | POA: Diagnosis present

## 2023-04-11 DIAGNOSIS — R040 Epistaxis: Secondary | ICD-10-CM | POA: Diagnosis present

## 2023-04-11 DIAGNOSIS — N186 End stage renal disease: Secondary | ICD-10-CM | POA: Diagnosis present

## 2023-04-11 DIAGNOSIS — Z794 Long term (current) use of insulin: Secondary | ICD-10-CM | POA: Diagnosis not present

## 2023-04-11 DIAGNOSIS — E8770 Fluid overload, unspecified: Secondary | ICD-10-CM | POA: Diagnosis not present

## 2023-04-11 DIAGNOSIS — D631 Anemia in chronic kidney disease: Secondary | ICD-10-CM | POA: Diagnosis present

## 2023-04-11 DIAGNOSIS — Z888 Allergy status to other drugs, medicaments and biological substances status: Secondary | ICD-10-CM | POA: Diagnosis not present

## 2023-04-11 DIAGNOSIS — I272 Pulmonary hypertension, unspecified: Secondary | ICD-10-CM | POA: Diagnosis present

## 2023-04-11 DIAGNOSIS — E1165 Type 2 diabetes mellitus with hyperglycemia: Secondary | ICD-10-CM | POA: Diagnosis present

## 2023-04-11 DIAGNOSIS — I442 Atrioventricular block, complete: Secondary | ICD-10-CM | POA: Diagnosis present

## 2023-04-11 DIAGNOSIS — E871 Hypo-osmolality and hyponatremia: Secondary | ICD-10-CM | POA: Diagnosis present

## 2023-04-11 DIAGNOSIS — N289 Disorder of kidney and ureter, unspecified: Secondary | ICD-10-CM | POA: Diagnosis not present

## 2023-04-11 DIAGNOSIS — E876 Hypokalemia: Secondary | ICD-10-CM | POA: Diagnosis present

## 2023-04-11 DIAGNOSIS — Z992 Dependence on renal dialysis: Secondary | ICD-10-CM | POA: Diagnosis not present

## 2023-04-11 DIAGNOSIS — I132 Hypertensive heart and chronic kidney disease with heart failure and with stage 5 chronic kidney disease, or end stage renal disease: Secondary | ICD-10-CM | POA: Diagnosis present

## 2023-04-11 DIAGNOSIS — J9601 Acute respiratory failure with hypoxia: Secondary | ICD-10-CM | POA: Diagnosis present

## 2023-04-11 DIAGNOSIS — E1122 Type 2 diabetes mellitus with diabetic chronic kidney disease: Secondary | ICD-10-CM | POA: Diagnosis present

## 2023-04-11 DIAGNOSIS — R0902 Hypoxemia: Secondary | ICD-10-CM | POA: Diagnosis present

## 2023-04-11 DIAGNOSIS — Z950 Presence of cardiac pacemaker: Secondary | ICD-10-CM | POA: Diagnosis not present

## 2023-04-11 DIAGNOSIS — R262 Difficulty in walking, not elsewhere classified: Secondary | ICD-10-CM | POA: Diagnosis present

## 2023-04-11 DIAGNOSIS — E785 Hyperlipidemia, unspecified: Secondary | ICD-10-CM | POA: Diagnosis present

## 2023-04-11 LAB — TROPONIN I (HIGH SENSITIVITY): Troponin I (High Sensitivity): 61 ng/L — ABNORMAL HIGH (ref <18)

## 2023-04-11 LAB — CBC
HCT: 34.8 % — ABNORMAL LOW (ref 36.0–46.0)
Hemoglobin: 10.7 g/dL — ABNORMAL LOW (ref 12.0–15.0)
MCH: 27.9 pg (ref 26.0–34.0)
MCHC: 30.7 g/dL (ref 30.0–36.0)
MCV: 90.9 fL (ref 80.0–100.0)
Platelets: 184 10*3/uL (ref 150–400)
RBC: 3.83 MIL/uL — ABNORMAL LOW (ref 3.87–5.11)
RDW: 18.6 % — ABNORMAL HIGH (ref 11.5–15.5)
WBC: 8 10*3/uL (ref 4.0–10.5)
nRBC: 0.4 % — ABNORMAL HIGH (ref 0.0–0.2)

## 2023-04-11 LAB — HIV ANTIBODY (ROUTINE TESTING W REFLEX): HIV Screen 4th Generation wRfx: NONREACTIVE

## 2023-04-11 LAB — PROTIME-INR
INR: 3.2 — ABNORMAL HIGH (ref 0.8–1.2)
Prothrombin Time: 32.8 seconds — ABNORMAL HIGH (ref 11.4–15.2)

## 2023-04-11 LAB — HEPATITIS B SURFACE ANTIGEN: Hepatitis B Surface Ag: NONREACTIVE

## 2023-04-11 MED ORDER — ACETAMINOPHEN 325 MG PO TABS: 650.0000 mg | ORAL_TABLET | Freq: Four times a day (QID) | ORAL | Status: DC | PRN

## 2023-04-11 MED ORDER — DOCUSATE SODIUM 283 MG RE ENEM: 1.0000 | ENEMA | RECTAL | Status: DC | PRN

## 2023-04-11 MED ORDER — WARFARIN - PHARMACIST DOSING INPATIENT: Freq: Every day | Status: DC

## 2023-04-11 MED ORDER — ALBUTEROL SULFATE (2.5 MG/3ML) 0.083% IN NEBU: 2.5000 mg | INHALATION_SOLUTION | RESPIRATORY_TRACT | Status: DC | PRN

## 2023-04-11 MED ORDER — AMIODARONE HCL 200 MG PO TABS
200.0000 mg | ORAL_TABLET | Freq: Every day | ORAL | Status: DC
  Administered 2023-04-11 – 2023-04-12 (×2): 200 mg via ORAL
  Filled 2023-04-11 (×2): qty 1

## 2023-04-11 MED ORDER — RENA-VITE PO TABS
1.0000 | ORAL_TABLET | Freq: Every day | ORAL | Status: DC
  Administered 2023-04-11: 1 via ORAL
  Filled 2023-04-11: qty 1

## 2023-04-11 MED ORDER — NEPRO/CARBSTEADY PO LIQD: 237.0000 mL | Freq: Three times a day (TID) | ORAL | Status: DC | PRN

## 2023-04-11 MED ORDER — INSULIN ASPART 100 UNIT/ML IJ SOLN
0.0000 [IU] | Freq: Three times a day (TID) | INTRAMUSCULAR | Status: DC
  Administered 2023-04-11: 2 [IU] via SUBCUTANEOUS

## 2023-04-11 MED ORDER — ONDANSETRON HCL 4 MG PO TABS: 4.0000 mg | ORAL_TABLET | Freq: Four times a day (QID) | ORAL | Status: DC | PRN

## 2023-04-11 MED ORDER — HEPARIN SODIUM (PORCINE) 1000 UNIT/ML DIALYSIS
3000.0000 [IU] | Freq: Once | INTRAMUSCULAR | Status: AC
  Administered 2023-04-11: 3000 [IU] via INTRAVENOUS_CENTRAL
  Filled 2023-04-11: qty 3

## 2023-04-11 MED ORDER — ASPIRIN 81 MG PO CHEW
81.0000 mg | CHEWABLE_TABLET | Freq: Every day | ORAL | Status: DC
  Administered 2023-04-11 – 2023-04-12 (×2): 81 mg via ORAL
  Filled 2023-04-11 (×2): qty 1

## 2023-04-11 MED ORDER — CALCIUM POLYCARBOPHIL 625 MG PO TABS
625.0000 mg | ORAL_TABLET | Freq: Every day | ORAL | Status: DC
  Administered 2023-04-11 – 2023-04-12 (×2): 625 mg via ORAL
  Filled 2023-04-11 (×2): qty 1

## 2023-04-11 MED ORDER — HYDROXYZINE HCL 25 MG PO TABS: 25.0000 mg | ORAL_TABLET | Freq: Three times a day (TID) | ORAL | Status: DC | PRN

## 2023-04-11 MED ORDER — DOXERCALCIFEROL 4 MCG/2ML IV SOLN: 3.0000 ug | INTRAVENOUS | Status: DC

## 2023-04-11 MED ORDER — CALCIUM CARBONATE ANTACID 1250 MG/5ML PO SUSP: 500.0000 mg | Freq: Four times a day (QID) | ORAL | Status: DC | PRN

## 2023-04-11 MED ORDER — ZOLPIDEM TARTRATE 5 MG PO TABS: 5.0000 mg | ORAL_TABLET | Freq: Every evening | ORAL | Status: DC | PRN

## 2023-04-11 MED ORDER — HYDRALAZINE HCL 20 MG/ML IJ SOLN: 5.0000 mg | INTRAMUSCULAR | Status: DC | PRN

## 2023-04-11 MED ORDER — ONDANSETRON HCL 4 MG/2ML IJ SOLN: 4.0000 mg | Freq: Four times a day (QID) | INTRAMUSCULAR | Status: DC | PRN

## 2023-04-11 MED ORDER — FAMOTIDINE 20 MG PO TABS
10.0000 mg | ORAL_TABLET | Freq: Every day | ORAL | Status: DC
  Administered 2023-04-11 – 2023-04-12 (×2): 10 mg via ORAL
  Filled 2023-04-11 (×2): qty 1

## 2023-04-11 MED ORDER — MELATONIN 5 MG PO TABS
10.0000 mg | ORAL_TABLET | Freq: Every day | ORAL | Status: DC
  Administered 2023-04-11: 10 mg via ORAL
  Filled 2023-04-11: qty 2

## 2023-04-11 MED ORDER — MIDODRINE HCL 5 MG PO TABS
20.0000 mg | ORAL_TABLET | Freq: Three times a day (TID) | ORAL | Status: DC
  Administered 2023-04-11 – 2023-04-12 (×3): 20 mg via ORAL
  Filled 2023-04-11 (×3): qty 4

## 2023-04-11 MED ORDER — CINACALCET HCL 30 MG PO TABS: 60.0000 mg | ORAL_TABLET | ORAL | Status: DC

## 2023-04-11 MED ORDER — BASAGLAR KWIKPEN 100 UNIT/ML ~~LOC~~ SOPN
8.0000 [IU] | PEN_INJECTOR | Freq: Every day | SUBCUTANEOUS | Status: DC
  Administered 2023-04-11 – 2023-04-12 (×2): 8 [IU] via SUBCUTANEOUS
  Filled 2023-04-11: qty 3

## 2023-04-11 MED ORDER — SORBITOL 70 % SOLN: 30.0000 mL | Status: DC | PRN

## 2023-04-11 MED ORDER — CHLORHEXIDINE GLUCONATE CLOTH 2 % EX PADS: 6.0000 | MEDICATED_PAD | Freq: Every day | CUTANEOUS | Status: DC

## 2023-04-11 MED ORDER — SODIUM CHLORIDE 0.9% FLUSH
3.0000 mL | Freq: Two times a day (BID) | INTRAVENOUS | Status: DC
  Administered 2023-04-11 – 2023-04-12 (×3): 3 mL via INTRAVENOUS

## 2023-04-11 MED ORDER — ACETAMINOPHEN 650 MG RE SUPP: 650.0000 mg | Freq: Four times a day (QID) | RECTAL | Status: DC | PRN

## 2023-04-11 MED ORDER — CAMPHOR-MENTHOL 0.5-0.5 % EX LOTN: 1.0000 | TOPICAL_LOTION | Freq: Three times a day (TID) | CUTANEOUS | Status: DC | PRN

## 2023-04-11 MED ORDER — SIMETHICONE 80 MG PO CHEW
80.0000 mg | CHEWABLE_TABLET | Freq: Four times a day (QID) | ORAL | Status: DC
  Administered 2023-04-11 – 2023-04-12 (×5): 80 mg via ORAL
  Filled 2023-04-11 (×5): qty 1

## 2023-04-11 NOTE — H&P (Signed)
History and Physical    Patient: Colleen [name] FAO:[id number] DOB: 22-Oct-1961 DOA: 04/10/2023 DOS: the patient was seen and examined on 04/11/2023 PCP: Irena Reichmann, DO  Patient coming from: Home - lives with alone, friend is staying with her right now; NOK: Mother, [phone]   Chief Complaint: Epistaxis  HPI: Colleen [name] is a 62 y.o. female with medical history significant of DM, ESRD on MWF HD, HTN, VTE on Coumadin, and HLD presenting with SOB. She went to urgent care because she had a nosebleed from 11am-9pm.  They sent her to the ER but she blew her nose and a huge clot came out and then the bleeding stopped.  She was found to be hypoxic, which she blames on the nosebleed and clot.  She is not on home O2.  She has not missed HD but last session she went on Friday and they only took 0.2 off instead of 1.5.  She feels GREAT now.  Some cough.  She was recently released from rehab after valve replacements and pacemaker placement.  She is not able to walk currently, is having home PT and so has a friend living with her.  She thinks her body just shut down after the surgery. She has been walking and doing leg exercises at home, thinks she is getting better with her strength.  She is able to walk across the room with a walker at this time.    ER Course:  [facility] to [facility] transfer, per Dr. Antionette Char:  Seen at urgent care initially for epistaxis but noted to be hypoxic and sent to ED. She is saturating in 80s on rm air with RR in 20s. CXR with right pleural effusion that is improved from prior study. Labs are most notable for BNP 2807, troponin 55, and INR 3.4. Nephrology (Dr. Abel Presto) was consulted and patient was placed on supplemental O2. Second troponin is pending.      Review of Systems: As mentioned in the history of present illness. All other systems reviewed and are negative. Past Medical History:  Diagnosis Date   Anemia    Diabetes (HCC)    type II   DVT (deep venous  thrombosis) (HCC)    E-coli UTI    ESRD on hemodialysis (HCC)    MWF at [facility]   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    Gout 06/05/2018   History of blood transfusion    History of claustrophobia    HLD (hyperlipidemia) 06/05/2018   Hypertension    PE (pulmonary embolism)    Pulled muscle    pt has right sided facial droop from pulled muscle in face since birth   Retinopathy    Was going blind and had surgery with improvement   Past Surgical History:  Procedure Laterality Date   A/V FISTULAGRAM Left 07/29/2019   Procedure: A/V FISTULAGRAM;  Surgeon: Sherren Kerns, MD;  Location: [facility] INVASIVE CV LAB;  Service: Cardiovascular;  Laterality: Left;   ARTERY EXPLORATION Left 06/14/2019   Procedure: Artery Exploration Left Upper Arm;  Surgeon: Sherren Kerns, MD;  Location: [facility] OR;  Service: Vascular;  Laterality: Left;   AV FISTULA PLACEMENT Left 12/31/2018   Procedure: ARTERIOVENOUS (AV) FISTULA CREATION VERSUS INSERTION OF ARTERIOVENOUS GRAFT LEFT ARM;  Surgeon: Sherren Kerns, MD;  Location: [facility] OR;  Service: Vascular;  Laterality: Left;   BASCILIC VEIN TRANSPOSITION Left 06/14/2019   Procedure: SECOND STAGE BASILIC VEIN TRANSPOSITION LEFT ARM;  Surgeon: Sherren Kerns, MD;  Location: [facility]  OR;  Service: Vascular;  Laterality: Left;   BIOPSY  03/05/2020   Procedure: BIOPSY;  Surgeon: Shellia Cleverly, DO;  Location: [facility] ENDOSCOPY;  Service: Gastroenterology;;   COLONOSCOPY WITH PROPOFOL N/A 03/05/2020   Procedure: COLONOSCOPY WITH PROPOFOL;  Surgeon: Shellia Cleverly, DO;  Location: [facility] ENDOSCOPY;  Service: Gastroenterology;  Laterality: N/A;   EYE SURGERY     9 total eye surgery including laser and cararact surgery   HEMOSTASIS CLIP PLACEMENT  03/05/2020   Procedure: HEMOSTASIS CLIP PLACEMENT;  Surgeon: Shellia Cleverly, DO;  Location: [facility] ENDOSCOPY;  Service: Gastroenterology;;   INSERTION OF DIALYSIS CATHETER N/A 12/31/2018   Procedure: INSERTION OF TUNNELED  DIALYSIS CATHETER;   Surgeon: Sherren Kerns, MD;  Location: [facility] OR;  Service: Vascular;  Laterality: N/A;   PERIPHERAL VASCULAR BALLOON ANGIOPLASTY Left 07/29/2019   Procedure: PERIPHERAL VASCULAR BALLOON ANGIOPLASTY;  Surgeon: Sherren Kerns, MD;  Location: [facility] INVASIVE CV LAB;  Service: Cardiovascular;  Laterality: Left;  ARM FISTULA   POLYPECTOMY  03/05/2020   Procedure: POLYPECTOMY;  Surgeon: Shellia Cleverly, DO;  Location: [facility] ENDOSCOPY;  Service: Gastroenterology;;   TEE WITHOUT CARDIOVERSION N/A 12/12/2020   Procedure: TRANSESOPHAGEAL ECHOCARDIOGRAM (TEE);  Surgeon: Wendall Stade, MD;  Location: [facility] ENDOSCOPY;  Service: Cardiovascular;  Laterality: N/A;   Social History:  reports that she has never smoked. She has never used smokeless tobacco. She reports that she does not drink alcohol and does not use drugs.  Allergies  Allergen Reactions   Farxiga [Dapagliflozin] Other (See Comments)    Cannot take due to kidneys   Hydrocodone Other (See Comments)    "Increase Glucose level," per pt   Kombiglyze Xr [Saxagliptin-Metformin Er] Other (See Comments)    Cannot take due to kidneys    Family History  Problem Relation Age of Onset   Diabetes Father    Diabetes Maternal Aunt    CAD Neg Hx    Colon cancer Neg Hx    Esophageal cancer Neg Hx    Cancer Neg Hx     Prior to Admission medications   Medication Sig Start Date End Date Taking? Authorizing Provider  amiodarone (PACERONE) 200 MG tablet Take 1 tablet (200 mg total) by mouth daily. 04/08/23   Love, Evlyn Kanner, PA-C  aspirin 81 MG chewable tablet Chew 1 tablet (81 mg total) by mouth daily. 04/08/23   Love, Evlyn Kanner, PA-C  atomoxetine (STRATTERA) 18 MG capsule Take 1 capsule (18 mg total) by mouth daily. 04/09/23   Love, Evlyn Kanner, PA-C  multivitamin (RENA-VIT) TABS tablet Take 1 tablet by mouth at bedtime. 04/08/23   Jacquelynn Cree, PA-C  cinacalcet (SENSIPAR) 30 MG tablet Take 60 mg by mouth every Monday, Wednesday, and Friday.    [provider]  famotidine (PEPCID) 10 MG tablet Take 1 tablet (10 mg total) by mouth daily. 04/08/23   Love, Evlyn Kanner, PA-C  Insulin Glargine (BASAGLAR KWIKPEN) 100 UNIT/ML Inject 8 Units into the skin daily. Pen expires 28 days after first use 04/08/23   Love, Evlyn Kanner, PA-C  Insulin Pen Needle 32G X 4 MM MISC 1 Application by Does not apply route at bedtime. 04/09/23   Love, Evlyn Kanner, PA-C  leptospermum manuka honey (MEDIHONEY) PSTE paste Apply 1 Application topically daily. To left elbow wound and cover with dry dressing. 04/09/23   Love, Evlyn Kanner, PA-C  melatonin 5 MG TABS Take 2 tablets (10 mg total) by mouth at bedtime. 04/08/23   Delle Reining  S, PA-C  midodrine (PROAMATINE) 10 MG tablet Take 2 tablets (20 mg total) by mouth every 8 (eight) hours. New medication. On discharge orders from [facility] 03/24/23. 04/08/23   Love, Evlyn Kanner, PA-C  polycarbophil (FIBERCON) 625 MG tablet Take 1 tablet (625 mg total) by mouth daily. 04/09/23   Love, Evlyn Kanner, PA-C  simethicone (MYLICON) 80 MG chewable tablet Chew 1 tablet (80 mg total) by mouth 4 (four) times daily. 04/08/23   Love, Evlyn Kanner, PA-C  sodium chloride (OCEAN) 0.65 % SOLN nasal spray Place 1 spray into both nostrils as needed for congestion. 04/08/23   Love, Evlyn Kanner, PA-C  Vitamin D, Ergocalciferol, (DRISDOL) 1.25 MG (50000 UNIT) CAPS capsule Take 1 capsule (50,000 Units total) by mouth every 7 (seven) days. 04/15/23   Love, Evlyn Kanner, PA-C  warfarin (COUMADIN) 4 MG tablet Take with supper--take 1/2 tablet on Mon,Wed and Friday. Take whole tablet on Tue, Thus, Sat, Sun. 04/09/23   Jacquelynn Cree, [place]    Physical Exam: Vitals:   04/11/23 0100 04/11/23 0333 04/11/23 0400 04/11/23 0828  BP: 112/61 (!) 128/58    Pulse: 98 95    Resp: 18 20  20   Temp:  (!) 97.1 F (36.2 C) (!) 97.4 F (36.3 C) 97.9 F (36.6 C)  TempSrc:  Axillary Axillary Oral  SpO2: 100% 100%    Weight:      Height:       General:  Appears calm and comfortable and is in NAD Eyes:   normal lids,  iris ENT:  grossly normal hearing, lips & tongue, mmm; chronic R facial muscle paralysis (reports congenital) Neck:  no LAD, masses or thyromegaly Cardiovascular:  RRR, no m/r/g. 1-2+ LE edema.  Respiratory:   CTA bilaterally with no wheezes/rales/rhonchi.  Mildly increased respiratory effort on face mask O2, guppy breathing when removed with desaturation to 83% before mask was replaced. Abdomen:  soft, NT, ND Skin:  no rash or induration seen on limited exam Musculoskeletal:  grossly normal tone BUE/BLE, good ROM, no bony abnormality Psychiatric:  grossly normal mood and affect, speech fluent and appropriate, AOx3 Neurologic:  CN 2-12 grossly intact, moves all extremities in coordinated fashion   Radiological Exams on Admission: Independently reviewed - see discussion in A/P where applicable  DG Chest Port 1 View  Result Date: 04/10/2023 CLINICAL DATA:  Hypotension and hypoxia.  New onset epistaxis. EXAM: PORTABLE CHEST 1 VIEW COMPARISON:  AP Lat 03/27/2023 FINDINGS: There are sternotomy sutures, again noted 2 prosthetic heart valves most likely mitral and aortic, and a cardiac monitoring device in-situ. The heart is enlarged but unchanged. No vascular congestion is seen. The mediastinum is normally outlined. There is calcification of the transverse aorta. Small layering right pleural effusion is again noted. There is overlying hazy atelectasis or infiltrate which has improved. Remainder of the lungs are clear. There is a tangle of overlying monitor wires. No acute osseous findings. Compare: Perihilar vascular congestion and interstitial edema are no longer seen. As above there is improvement in right basilar opacities. IMPRESSION: 1. Small layering right pleural effusion with overlying hazy atelectasis or infiltrate, improved since 03/27/2023. 2. Stable cardiomegaly. No vascular congestion. 3. Aortic atherosclerosis. Electronically Signed   By: Almira Bar M.D.   On: 04/10/2023 22:02    EKG:  Independently reviewed.  NSR with rate 98; prolonged QTc 556; nonspecific ST changes with no evidence of acute ischemia   Labs on Admission: I have personally reviewed the available labs and imaging studies at  the time of the admission.  Pertinent labs:    Glucose 257 BUN 25/Creatinine 3.96/GFR 12 AST 61 BNP 2806.8 HS troponin 55, 61 WBC 8.8 Hgb 10.8 INR 3.4   Assessment and Plan: Principal Problem:   Hypervolemia associated with renal insufficiency Active Problems:   ESRD on dialysis (HCC)   HLD (hyperlipidemia)   Type 2 diabetes mellitus with hyperglycemia, with long-term current use of insulin (HCC)   S/P MVR (mitral valve repair)   Epistaxis   Hypotension   Ambulatory dysfunction     Volume overload in an ESRD on HD patient -Patient went to HD yesterday but had minimal fluid pulled off because her BP would not tolerate- she reports 0.2 vs. Planned 1.5 -Suspect this is leading to volume overload which is leading to hypoxic respiratory failure -She is likely to benefit from an additional HD session tomorrow and may be appropriate for d/c to home after HD depending on how she feels and ongoing O2 requirements -Will admit to telemetry and continue to closely monitor  ESRD -Patient on chronic MWF HD -Nephrology prn order set utilized -Nephrology is aware that patient will need HD  -Continue rena-vite, Sensipar, Auryxia  Epistaxis -Patient initially presented yesterday to UC and then to [facility] related to epistaxis -It finally resolved after she blew out a large clot -Will hold today's dose of Coumadin, INR is at goal -Consider holding ASA indefinitely since she is also on high-dose Coumadin  S/p mechanical mitral valve -INR goal is 2.5-3.5 -Coumadin per pharmacy -Holding today's dose, likely to resume tomorrow  Heart block/atrial fibrillation -Peri-operative complication -PPM placement on 3/21 -Continues amiodarone for rate control  DM -Will check  A1c -Continue glargine -Cover with very sensitive-scale SSI   Hypotension -This limited fluid removal yesterday at HD -Continue midodrine  ADHD -Hold Strattera - she will not be performing tasks that require attention and focus as an inpatient   Ambulatory dysfunction -Recently discharged from CIR on 5/2 -Per dc summary, "Her gains were limited by generalized weakness, refusal to attempt certain functional tasks and self limiting behaviors."  -She reports ongoing weakness and difficulty ambulating at home since dc (2 days ago)     Advance Care Planning:   Code Status: Full Code - Code status was discussed with the patient and/or family at the time of admission.  The patient would want to receive full resuscitative measures at this time.   Consults: Nephrology; PT/OT; TOC team  DVT Prophylaxis: Coumadin  Family Communication: None present; I attempted to reach her mother by telephone at the time of admission without success  Severity of Illness: The appropriate patient status for this patient is INPATIENT. Inpatient status is judged to be reasonable and necessary in order to provide the required intensity of service to ensure the patient's safety. The patient's presenting symptoms, physical exam findings, and initial radiographic and laboratory data in the context of their chronic comorbidities is felt to place them at high risk for further clinical deterioration. Furthermore, it is not anticipated that the patient will be medically stable for discharge from the hospital within 2 midnights of admission.   * I certify that at the point of admission it is my clinical judgment that the patient will require inpatient hospital care spanning beyond 2 midnights from the point of admission due to high intensity of service, high risk for further deterioration and high frequency of surveillance required.*  Author: Jonah Blue, MD 04/11/2023 2:02 PM  For on call review [email or web address].

## 2023-04-11 NOTE — Progress Notes (Signed)
Received patient in bed to unit.  Alert and oriented.  Informed consent signed and in chart.   TX duration:3  Patient tolerated well.  Transported back to the room  Alert, without acute distress.  Hand-off given to patient's nurse.   Access used: left AVF Access issues: none  Total UF removed: 2.9L Medication(s) given: midodrine    04/11/23 1817  Vitals  Temp 97.6 F (36.4 C)  Temp Source Axillary  BP (!) 86/46 (per Dr. Arlean Hopping order pt fine no c/o of dizziness, chest pain, sob)  MAP (mmHg) (!) 58  BP Location Right Arm  BP Method Automatic  Patient Position (if appropriate) Lying  Pulse Rate (!) 111  Pulse Rate Source Monitor  ECG Heart Rate (!) 111  Resp 20  Oxygen Therapy  SpO2 98 %  O2 Device Nasal Cannula  O2 Flow Rate (L/min) 2 L/min  During Treatment Monitoring  HD Safety Checks Performed Yes  Intra-Hemodialysis Comments Tx completed  Dialysis Fluid Bolus Normal Saline  Bolus Amount (mL) 400 mL      Tenishia Ekman S [facility]

## 2023-04-11 NOTE — Procedures (Signed)
   I was present at this dialysis session, have reviewed the session itself and made  appropriate changes Vinson Moselle MD [facility] pager [phone]   04/11/2023, 7:29 PM

## 2023-04-11 NOTE — ED Notes (Signed)
[facility] called for transport. 

## 2023-04-11 NOTE — Progress Notes (Addendum)
Plan of Care Note for accepted transfer   Patient: Colleen [name] MRN: [id number]   DOA: 04/10/2023  Facility requesting transfer: [facility]  Requesting Provider: Dr. Suezanne Jacquet   Reason for transfer: Acute hypoxic respiratory failure   Facility course: 62 yr old lady with ESRD, IDDM, mitral and tricuspid valve surgery, and DVT/PE on coumadin who was seen at urgent care initially for epistaxis but noted to be hypoxic and sent to ED.   She is saturating in 80s on rm air with RR in 20s. CXR with right pleural effusion that is improved from prior study. Labs are most notable for BNP 2807, troponin 55, and INR 3.4.   Nephrology (Dr. Abel Presto) was consulted and patient was placed on supplemental O2. Second troponin is pending.   Plan of care: The patient is accepted for admission to Telemetry unit, at [facility].   Author: Briscoe Deutscher, MD 04/11/2023  Check [email or web address] for on-call coverage.  Nursing staff, Please call [facility] Admits & Consults System-Wide number on Amion as soon as patient's arrival, so appropriate admitting provider can evaluate the pt.

## 2023-04-11 NOTE — ED Notes (Signed)
RN attempted to draw repeat troponin off pt's IV. Pt's IV would not flush. [place] MD made aware. RN to attempt to establish new access

## 2023-04-11 NOTE — Plan of Care (Signed)

## 2023-04-11 NOTE — ED Notes (Addendum)
USGIV unsuccessful x3. Patient with poor vasculature due to being a dialysis patient.

## 2023-04-11 NOTE — Progress Notes (Signed)
PT Cancellation Note  Patient Details Name: Colleen [name] MRN: [id number] DOB: 09/22/1961   Cancelled Treatment:    Reason Eval/Treat Not Completed: Patient at procedure or test/unavailable (Pt being transported to dialysis. Will follow up tomorrow.)   Gladys Damme 04/11/2023, 2:54 PM

## 2023-04-11 NOTE — Progress Notes (Addendum)
ANTICOAGULATION CONSULT NOTE   Pharmacy Consult for Warfarin , INR goal 2.5-3.5  Indication: Mechanical MVR  Allergies  Allergen Reactions   Farxiga [Dapagliflozin] Other (See Comments)    Cannot take due to kidneys   Hydrocodone Other (See Comments)    "Increase Glucose level," per pt   Kombiglyze Xr [Saxagliptin-Metformin Er] Other (See Comments)    Cannot take due to kidneys    Patient Measurements: Height: 5\' 4"  (162.6 cm) Weight: 63.9 kg (140 lb 14 oz) IBW/kg (Calculated) : 54.7 Heparin Dosing Weight: 70.1 kg   Vital Signs: Temp: 97.9 F (36.6 C) (05/04 0828) Temp Source: Oral (05/04 0828) BP: 128/58 (05/04 0333) Pulse Rate: 95 (05/04 0333)  Labs: Recent Labs    04/09/23 0654 04/10/23 2115 04/11/23 0231  HGB  --  10.8*  --   HCT  --  35.3*  --   PLT  --  200  --   LABPROT 34.2* 34.3*  --   INR 3.4* 3.4*  --   CREATININE  --  3.96*  --   TROPONINIHS  --  55* 61*     Estimated Creatinine Clearance: 13 mL/min (A) (by C-G formula based on SCr of 3.96 mg/dL (H)).   Medical History: Past Medical History:  Diagnosis Date   Anemia    Diabetes (HCC)    type II   DVT (deep venous thrombosis) (HCC)    E-coli UTI    ESRD on hemodialysis (HCC)    MWF at [facility]   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    Gout 06/05/2018   History of blood transfusion    History of claustrophobia    HLD (hyperlipidemia) 06/05/2018   Hypertension    PE (pulmonary embolism)    Pulled muscle    pt has right sided facial droop from pulled muscle in face since birth   Retinopathy    Was going blind and had surgery with improvement   Assessment: 62 year old female admitted to inpatient rehab at [facility] on 03/24/23 from [facility] (3/14-4/16/24) s/p MVR. Developed Afib post-op, underwent leadless PPM placement on 3/21. Patient has a history of DVT/PE (2016, 2019). [facility] started her on Warfarin ~ 3/16. Patient discharged from CIR on 5/2. On 5/3, patient presented to ED due to uncontrolled  nosebleed on warfarin as well as hypoxia and hypotension. Pharmacy consulted for warfarin dosing. PTA regimen: 2 mg daily on MWF and 4 mg daily all other days. Last warfarin dose on 04/10/23 at 1300.  Of note: Amiodarone 200 mg daily started 4/10. Anticipate amiodarone effect on warfarin dosing should be stable at this time. Patient with epistaxis on 5/3. Per patient, she had a large blood clot in nose this morning ~0400, but no issues with bleeding since.   INR is therapeutic at 3.2 this morning. CBC is stable, Given recent issues with nosebleed, will hold warfarin today and follow-up on epistaxis in the morning.   Goal of Therapy:  INR 2.5-3.5 (mechanical MVR) Monitor platelets per anticoagulation protocol: Yes   Plan:  Hold warfarin today Monitor daily INR, CBC, and signs/symptoms of bleeding F/u epistaxis in AM  Thank you for involving pharmacy in this patient's care.   Rockwell Alexandria, PharmD PGY1 Pharmacy Resident 04/11/2023 11:32 AM

## 2023-04-11 NOTE — Consult Note (Signed)
Renal Service Consult Note [facility]  Colleen [name] 04/11/2023 Colleen Krabbe, MD Requesting Physician: Dr. Ophelia Charter  Reason for Consult: ESRD pt w/ SOB HPI: The patient is a 62 y.o. year-old w/ PMH as below who presented 5/3 yesterday to ED sent from [facility] for nosebleed. They sent her to the ER where a large clot came out and the bleeding stopped. She was found to be hypoxic, is not on home O2. She had her usual HD yesterday 5/03 but they didn't take much off, only 0.2kg. Pt was just dc'd on 5/2 from CIR here which was after MVR and TV repair heart surgery at [facility]. In ED sats were 80s on RA w/ RR in the 20s. CXR showed R effusion improved from prior. BNP was 2807. CXR w/o edema. Pt was placed on O2 and admitted. We area asked to see for dialysis.   Pt seen in her room. She won't agree to more than 3 hrs of HD at one time, but is much more open to taking off whatever extra fluid she has than she was at the beginning of the last admission. +orthopnea. No CP.    ROS - denies CP, no joint pain, no HA, no blurry vision, no rash, no diarrhea, no nausea/ vomiting, no dysuria, no difficulty voiding   Past Medical History  Past Medical History:  Diagnosis Date   Anemia    Diabetes (HCC)    type II   DVT (deep venous thrombosis) (HCC)    E-coli UTI    ESRD on hemodialysis (HCC)    MWF at [facility]   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    Gout 06/05/2018   History of blood transfusion    History of claustrophobia    HLD (hyperlipidemia) 06/05/2018   Hypertension    PE (pulmonary embolism)    Pulled muscle    pt has right sided facial droop from pulled muscle in face since birth   Retinopathy    Was going blind and had surgery with improvement   Past Surgical History  Past Surgical History:  Procedure Laterality Date   A/V FISTULAGRAM Left 07/29/2019   Procedure: A/V FISTULAGRAM;  Surgeon: Sherren Kerns, MD;  [facility] INV[facility] CV LAB;  Service: Cardiovascular;   Laterality: Left;   ARTERY EXPLORATION Left 06/14/2019   Procedure: Artery Exploration Left Upper Arm;  Surgeon: Sherren Kerns, MD;  Location: [facility] OR;  Service: Vascular;  Laterality: Left;   AV FISTULA PLACEMENT Left 12/31/2018   Procedure: ARTERIOVENOUS (AV) FISTULA CREATION VERSUS INSERTION OF ARTERIOVENOUS GRAFT LEFT ARM;  Surgeon: Sherren Kerns, MD;  Location: [facility] OR;  Service: Vascular;  Laterality: Left;   BASCILIC VEIN TRANSPOSITION Left 06/14/2019   Procedure: SECOND STAGE BASILIC VEIN TRANSPOSITION LEFT ARM;  Surgeon: Sherren Kerns, MD;  Location: [facility] OR;  Service: Vascular;  Laterality: Left;   BIOPSY  03/05/2020   Procedure: BIOPSY;  Surgeon: Shellia Cleverly, DO;  Location: [facility] ENDOSCOPY;  Service: Gastroenterology;;   COLONOSCOPY WITH PROPOFOL N/A 03/05/2020   Procedure: COLONOSCOPY WITH PROPOFOL;  Surgeon: Shellia Cleverly, DO;  Location: [facility] ENDOSCOPY;  Service: Gastroenterology;  Laterality: N/A;   EYE SURGERY     9 total eye surgery including laser and cararact surgery   HEMOSTASIS CLIP PLACEMENT  03/05/2020   Procedure: HEMOSTASIS CLIP PLACEMENT;  Surgeon: Shellia Cleverly, DO;  Location: [facility]OSCOPY;  Service: Gastroenterology;;   INSERTION OF DIALYSIS CATHETER N/A 12/31/2018   Procedure: INSERTION OF  TUNNELED  DIALYSIS CATHETER;  Surgeon: Sherren Kerns, MD;  Location: [facility] OR;  Service: Vascular;  Laterality: N/A;   PERIPHERAL VASCULAR BALLOON ANGIOPLASTY Left 07/29/2019   Procedure: PERIPHERAL VASCULAR BALLOON ANGIOPLASTY;  Surgeon: Sherren Kerns, MD;  [facility];  Service: Cardiovascular;  Laterality: Left;  ARM FISTULA   POLYPECTOMY  03/05/2020   Procedure: POLYPECTOMY;  Surgeon: Shellia Cleverly, DO;  [facility]OS[facility];  Service: Gastroenterology;;   TEE WITHOUT CARDIOVERSION N/A 12/12/2020   Procedure: TRANSESOPHAGEAL ECHOCARDIOGRAM (TEE);  Surgeon: Wendall Stade, MD;  Location: [facility] ENDOSCOPY;  Service: Cardiovascular;  Laterality: N/A;    Family History  Family History  Problem Relation Age of Onset   Diabetes Father    Diabetes Maternal Aunt    CAD Neg Hx    Colon cancer Neg Hx    Esophageal cancer Neg Hx    Cancer Neg Hx    Social History  reports that she has never smoked. She has never used smokeless tobacco. She reports that she does not drink alcohol and does not use drugs. Allergies  Allergies  Allergen Reactions   Farxiga [Dapagliflozin] Other (See Comments)    Cannot take due to kidneys   Hydrocodone Other (See Comments)    "Increase Glucose level," per pt   Kombiglyze Xr [Saxagliptin-Metformin Er] Other (See Comments)    Cannot take due to kidneys   Home medications Prior to Admission medications   Medication Sig Start Date End Date Taking? Authorizing Provider  amiodarone (PACERONE) 200 MG tablet Take 1 tablet (200 mg total) by mouth daily. 04/08/23  Yes Love, Evlyn Kanner, PA-C  aspirin 81 MG chewable tablet Chew 1 tablet (81 mg total) by mouth daily. 04/08/23  Yes Love, Evlyn Kanner, PA-C  atomoxetine (STRATTERA) 18 MG capsule Take 1 capsule (18 mg total) by mouth daily. 04/09/23  Yes Love, Evlyn Kanner, PA-C  cinacalcet (SENSIPAR) 30 MG tablet Take 60 mg by mouth every Monday, Wednesday, and Friday.   Yes [provider]  melatonin 5 MG TABS Take 2 tablets (10 mg total) by mouth at bedtime. 04/08/23  Yes Love, Evlyn Kanner, PA-C  warfarin (COUMADIN) 4 MG tablet Take with supper--take 1/2 tablet on Mon,Wed and Friday. Take whole tablet on Tue, Thus, Sat, Sun. Patient taking differently: Take 2-4 mg by mouth See admin instructions. Take with supper--take 2mg  by mouth on Mon,Wed and Friday. Take 4mg  by mouth on Tue, Thus, Sat, Sun. 04/09/23  Yes Love, Evlyn Kanner, PA-C  multivitamin (RENA-VIT) TABS tablet Take 1 tablet by mouth at bedtime. 04/08/23   Love, Evlyn Kanner, PA-C  famotidine (PEPCID) 10 MG tablet Take 1 tablet (10 mg total) by mouth daily. 04/08/23   Love, Evlyn Kanner, PA-C  Insulin Glargine (BASAGLAR KWIKPEN) 100 UNIT/ML  Inject 8 Units into the skin daily. Pen expires 28 days after first use 04/08/23   Love, Evlyn Kanner, PA-C  Insulin Pen Needle 32G X 4 MM MISC 1 Application by Does not apply route at bedtime. 04/09/23   Love, Evlyn Kanner, PA-C  leptospermum manuka honey (MEDIHONEY) PSTE paste Apply 1 Application topically daily. To left elbow wound and cover with dry dressing. 04/09/23   Love, Evlyn Kanner, PA-C  midodrine (PROAMATINE) 10 MG tablet Take 2 tablets (20 mg total) by mouth every 8 (eight) hours. New medication. On discharge orders from [facility] 03/24/23. 04/08/23   Love, Evlyn Kanner, PA-C  polycarbophil (FIBERCON) 625 MG tablet Take 1 tablet (625 mg total) by mouth daily.  04/09/23   Love, Evlyn Kanner, PA-C  simethicone (MYLICON) 80 MG chewable tablet Chew 1 tablet (80 mg total) by mouth 4 (four) times daily. 04/08/23   Love, Evlyn Kanner, PA-C  sodium chloride (OCEAN) 0.65 % SOLN nasal spray Place 1 spray into both nostrils as needed for congestion. 04/08/23   Love, Evlyn Kanner, PA-C  Vitamin D, Ergocalciferol, (DRISDOL) 1.25 MG (50000 UNIT) CAPS capsule Take 1 capsule (50,000 Units total) by mouth every 7 (seven) days. 04/15/23   Jacquelynn Cree, PA-C     Vitals:   04/11/23 0100 04/11/23 0333 04/11/23 0400 04/11/23 0828  BP: 112/61 (!) 128/58    Pulse: 98 95    Resp: 18 20  20   Temp:  (!) 97.1 F (36.2 C) (!) 97.4 F (36.3 C) 97.9 F (36.6 C)  TempSrc:  Axillary Axillary Oral  SpO2: 100% 100%    Weight:      Height:       Exam Gen alert, no distress No rash, cyanosis or gangrene Sclera anicteric, throat clear  No jvd or bruits Chest bilat crackles 1/3 up, no wheezing RRR no RG Abd soft ntnd no mass or ascites +bs GU defer MS no joint effusions or deformity Ext 1+ R pretib/ trace L pretib/ 1 bilat hip edema Neuro is alert, Ox 3 , nf    LUA AVF+bruit    Home meds include - amiodarone, aspirin, strattera, warfarin, renavite, famotidine, insulin glargine, midodrine 20 tid, fibercon, mylicon, prns/ vits/ supps    OP HD: [facility]  MWF 3h  400/ 1.5  69.5kg   2/2 bath  LUA AVF  Heparin 3000 - last OP HD 5/03, post wt 62.7kg - last IP HD was 5/01, post wt was 63.9kg - venofer 50 q wk - hectorol 3 mcg IV tiw - sensipar 60mg  po tiw  CXR - IMPRESSION: 1. Small layering right pleural effusion with overlying hazy atelectasis or infiltrate, improved since 03/27/2023. Stable cardiomegaly. No vascular congestion.   Assessment/ Plan: Acute hypoxic resp failure - seen by UC yest w/ low O2 sats. Here has LE edema and borderline vasc congestion by CXR. Was very much vol overloaded during her recent stay on CIR, and was not very cooperative w/ our goal of getting fluid off until towards the end of the admission when we were able to get a sig amount of fluid off. Today has rales on exam and mild LE edema (much better overall). Plan extra HD today w/ max UF 3-4L as BP tolerates.  SP recent MV replacement and TV repair - at [facility] in early March ESRD - on HD MWF. Pt will only run 3 hrs. Plan extra HD today.  BP/ volume - is on high dose midodrine 20 tid at home. Cont here.  Anemia esrd - Hb 10-11 range, no esa needs MBD ckd - CCa and phos are in range. Cont sensipar po and IV vdra w/ hd. Not sure about binder. Heart block/ atrial fib - sp PPM in 02/26/23 DM2 - per pmd   Vinson Moselle  MD [facility] 04/11/2023, 1:04 PM  Recent Labs  Lab 04/07/23 1229 04/10/23 2115 04/11/23 0958  HGB 10.1* 10.8* 10.7*  ALBUMIN 2.4* 3.0*  --   CALCIUM 8.0* 8.1*  --   PHOS 5.4*  --   --   CREATININE 8.79* 3.96*  --   K 3.6 4.5  --    Inpatient medications:  amiodarone  200 mg Oral Daily   aspirin  81 mg Oral  Daily   Basaglar KwikPen  8 Units Subcutaneous Daily   [START ON 04/13/2023] cinacalcet  60 mg Oral Q M,W,F   famotidine  10 mg Oral Daily   insulin aspart  0-6 Units Subcutaneous TID WC   melatonin  10 mg Oral QHS   midodrine  20 mg Oral TID PC   multivitamin  1 tablet Oral QHS   polycarbophil  625 mg Oral Daily   simethicone  80 mg Oral QID    sodium chloride flush  3 mL Intravenous Q12H   Warfarin - Pharmacist Dosing Inpatient   Does not apply q1600    acetaminophen **OR** acetaminophen, albuterol, calcium carbonate (dosed in mg elemental calcium), camphor-menthol **AND** hydrOXYzine, docusate sodium, feeding supplement (NEPRO CARB STEADY), hydrALAZINE, ondansetron **OR** ondansetron (ZOFRAN) IV, sorbitol, zolpidem

## 2023-04-11 NOTE — Progress Notes (Signed)
Patient is now on the unit. she is alert. currently on tip face mask. vitals WDLs. no c/o of pain or ShOB. no signs of epistaxis. Patient placement called

## 2023-04-11 NOTE — ED Notes (Signed)
ED TO INPATIENT HANDOFF REPORT  ED Nurse Name and Phone #: Kayon Dozier RN  S Name/Age/Gender Colleen [name] 62 y.o. female Room/Bed: MH03/MH03  Code Status   Code Status: Prior  Home/SNF/Other Home Patient oriented to: self, place, time, and situation Is this baseline? Yes   Triage Complete: Triage complete  Chief Complaint Acute respiratory failure with hypoxia (HCC) [J96.01]  Triage Note UC sent you due to BP being low Just d/c from hospital yesterday post of CABG and was started on blood thinners  Reports epistaxis that started today as chief complaint at UC earlier  Denies any pain at this time  Denies weakness or dizziness     Allergies Allergies  Allergen Reactions   Farxiga [Dapagliflozin] Other (See Comments)    Cannot take due to kidneys   Hydrocodone Other (See Comments)    "Increase Glucose level," per pt   Kombiglyze Xr [Saxagliptin-Metformin Er] Other (See Comments)    Cannot take due to kidneys    Level of Care/Admitting Diagnosis ED Disposition     ED Disposition  Admit   Condition  --   Comment  Hospital Area: [facility] [[id number]]  Level of Care: Telemetry Medical [104]  May admit patient to [facility] or [facility] if equivalent level of care is available:: No  Interfacility transfer: Yes  Covid Evaluation: Asymptomatic - no recent exposure (last 10 days) testing not required  Diagnosis: Acute respiratory failure with hypoxia [facility]) [[id number]]  Admitting Physician: Briscoe Deutscher [[id number]]  Attending Physician: Briscoe Deutscher [[id number]]  Certification:: I certify this patient will need inpatient services for at least 2 midnights  Estimated Length of Stay: 3          B Medical/Surgery History Past Medical History:  Diagnosis Date   Anemia    Diabetes (HCC)    type II   DVT (deep venous thrombosis) (HCC)    E-coli UTI    ESRD on hemodialysis (HCC)    MWF at [facility]   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    Gout  06/05/2018   History of blood transfusion    History of claustrophobia    HLD (hyperlipidemia) 06/05/2018   Hypertension    PE (pulmonary embolism)    Pulled muscle    pt has right sided facial droop from pulled muscle in face since birth   Retinopathy    Was going blind and had surgery with improvement   Past Surgical History:  Procedure Laterality Date   A/V FISTULAGRAM Left 07/29/2019   Procedure: A/V FISTULAGRAM;  Surgeon: Sherren Kerns, MD;  [facility] INVASIVE CV LAB;  Service: Cardiovascular;  Laterality: Left;   ARTERY EXPLORATION Left 06/14/2019   Procedure: Artery Exploration Left Upper Arm;  Surgeon: Sherren Kerns, MD;  Location: [facility] OR;  Service: Vascular;  Laterality: Left;   AV FISTULA PLACEMENT Left 12/31/2018   Procedure: ARTERIOVENOUS (AV) FISTULA CREATION VERSUS INSERTION OF ARTERIOVENOUS GRAFT LEFT ARM;  Surgeon: Sherren Kerns, MD;  Location: [facility] OR;  Service: Vascular;  Laterality: Left;   BASCILIC VEIN TRANSPOSITION Left 06/14/2019   Procedure: SECOND STAGE BASILIC VEIN TRANSPOSITION LEFT ARM;  Surgeon: Sherren Kerns, MD;  Location: [facility] OR;  Service: Vascular;  Laterality: Left;   BIOPSY  03/05/2020   Procedure: BIOPSY;  Surgeon: Shellia Cleverly, DO;  [facility] ENDOSCOPY;  Service: Gastroenterology;;   COLONOSCOPY WITH PROPOFOL N/A 03/05/2020   Procedure: COLONOSCOPY WITH PROPOFOL;  Surgeon: Shellia Cleverly, DO;  [facility]OS[facility];  Service: Gastroenterology;  Laterality: N/A;   EYE SURGERY     9 total eye surgery including laser and cararact surgery   HEMOSTASIS CLIP PLACEMENT  03/05/2020   Procedure: HEMOSTASIS CLIP PLACEMENT;  Surgeon: Shellia Cleverly, DO;  [facility]OS[facility];  Service: Gastroenterology;;   INSERTION OF DIALYSIS CATHETER N/A 12/31/2018   Procedure: INSERTION OF TUNNELED  DIALYSIS CATHETER;  Surgeon: Sherren Kerns, MD;  Location: [facility] OR;  Service: Vascular;  Laterality: N/A;   PERIPHERAL VASCULAR BALLOON ANGIOPLASTY  Left 07/29/2019   Procedure: PERIPHERAL VASCULAR BALLOON ANGIOPLASTY;  Surgeon: Sherren Kerns, MD;  [facility];  Service: Cardiovascular;  Laterality: Left;  ARM FISTULA   POLYPECTOMY  03/05/2020   Procedure: POLYPECTOMY;  Surgeon: Shellia Cleverly, DO;  Location[facility]OS[facility];  Service: Gastroenterology;;   TEE WITHOUT CARDIOVERSION N/A 12/12/2020   Procedure: TRANSESOPHAGEAL ECHOCARDIOGRAM (TEE);  Surgeon: Wendall Stade, MD;  Location: [facility] ENDOSCOPY;  Service: Cardiovascular;  Laterality: N/A;     A IV Location/Drains/Wounds Patient Lines/Drains/Airways Status     Active Line/Drains/Airways     Name Placement date Placement time Site Days   Fistula / Graft Left Upper arm Arteriovenous fistula --  --  Upper arm  --   Pressure Injury 03/24/23 Buttocks Left Stage 2 -  Partial thickness loss of dermis presenting as a shallow open injury with a red, pink wound bed without slough. 03/24/23  1500  -- 18   Wound / Incision (Open or Dehisced) 03/24/23 Abdomen Left;Upper;Right;Medial 03/24/23  1500  Abdomen  18            Intake/Output Last 24 hours No intake or output data in the 24 hours ending 04/11/23 0220  Labs/Imaging Results for orders placed or performed during the hospital encounter of 04/10/23 (from the past 48 hour(s))  Comprehensive metabolic panel     Status: Abnormal   Collection Time: 04/10/23  9:15 PM  Result Value Ref Range   Sodium 134 (L) 135 - 145 mmol/L   Potassium 4.5 3.5 - 5.1 mmol/L    Comment: HEMOLYSIS AT THIS LEVEL MAY AFFECT RESULT   Chloride 92 (L) 98 - 111 mmol/L   CO2 28 22 - 32 mmol/L   Glucose, Bld 257 (H) 70 - 99 mg/dL    Comment: Glucose reference range applies only to samples taken after fasting for at least 8 hours.   BUN 25 (H) 6 - 20 mg/dL   Creatinine, Ser 1.61 (H) 0.44 - 1.00 mg/dL   Calcium 8.1 (L) 8.9 - 10.3 mg/dL   Total Protein 8.6 (H) 6.5 - 8.1 g/dL   Albumin 3.0 (L) 3.5 - 5.0 g/dL   AST 61 (H) 15 - 41 U/L     Comment: HEMOLYSIS AT THIS LEVEL MAY AFFECT RESULT   ALT 18 0 - 44 U/L    Comment: HEMOLYSIS AT THIS LEVEL MAY AFFECT RESULT   Alkaline Phosphatase 93 38 - 126 U/L   Total Bilirubin 1.2 0.3 - 1.2 mg/dL    Comment: HEMOLYSIS AT THIS LEVEL MAY AFFECT RESULT   GFR, Estimated 12 (L) >60 mL/min    Comment: (NOTE) Calculated using the CKD-EPI Creatinine Equation (2021)    Anion gap 14 5 - 15    Comment: Performed at [facility], [address] [place], [place] [address]  CBC with Differential     Status: Abnormal   Collection Time: 04/10/23  9:15 PM  Result Value Ref  Range   WBC 8.8 4.0 - 10.5 K/uL   RBC 3.91 3.87 - 5.11 MIL/uL   Hemoglobin 10.8 (L) 12.0 - 15.0 g/dL   HCT 16.1 (L) 09.6 - 04.5 %   MCV 90.3 80.0 - 100.0 fL   MCH 27.6 26.0 - 34.0 pg   MCHC 30.6 30.0 - 36.0 g/dL   RDW 40.9 (H) 81.1 - 91.4 %   Platelets 200 150 - 400 K/uL   nRBC 0.9 (H) 0.0 - 0.2 %   Neutrophils Relative % 74 %   Neutro Abs 6.6 1.7 - 7.7 K/uL   Lymphocytes Relative 11 %   Lymphs Abs 0.9 0.7 - 4.0 K/uL   Monocytes Relative 13 %   Monocytes Absolute 1.1 (H) 0.1 - 1.0 K/uL   Eosinophils Relative 0 %   Eosinophils Absolute 0.0 0.0 - 0.5 K/uL   Basophils Relative 1 %   Basophils Absolute 0.1 0.0 - 0.1 K/uL   Immature Granulocytes 1 %   Abs Immature Granulocytes 0.09 (H) 0.00 - 0.07 K/uL    Comment: Performed at [facility], [address] [place], [place] [address]  Brain natriuretic peptide     Status: Abnormal   Collection Time: 04/10/23  9:15 PM  Result Value Ref Range   B Natriuretic Peptide 2,806.8 (H) 0.0 - 100.0 pg/mL    Comment: Performed at [facility], [address] [place], [place] [address]  Troponin I (High Sensitivity)     Status: Abnormal   Collection Time: 04/10/23  9:15 PM  Result Value Ref Range   Troponin I (High Sensitivity) 55 (H) <18 ng/L    Comment: (NOTE) Elevated high sensitivity troponin I (hsTnI) values and significant  changes across  serial measurements may suggest ACS but many other  chronic and acute conditions are known to elevate hsTnI results.  Refer to the "Links" section for chest pain algorithms and additional  guidance. Performed at [facility], [address] [place], [place] [address]   Protime-INR     Status: Abnormal   Collection Time: 04/10/23  9:15 PM  Result Value Ref Range   Prothrombin Time 34.3 (H) 11.4 - 15.2 seconds   INR 3.4 (H) 0.8 - 1.2    Comment: (NOTE) INR goal varies based on device and disease states. Performed at [facility], [address], [place], [place] [address]    DG Chest [place] 1 View  Result Date: 04/10/2023 CLINICAL DATA:  Hypotension and hypoxia.  New onset epistaxis. EXAM: PORTABLE CHEST 1 VIEW COMPARISON:  AP Lat 03/27/2023 FINDINGS: There are sternotomy sutures, again noted 2 prosthetic heart valves most likely mitral and aortic, and a cardiac monitoring device in-situ. The heart is enlarged but unchanged. No vascular congestion is seen. The mediastinum is normally outlined. There is calcification of the transverse aorta. Small layering right pleural effusion is again noted. There is overlying hazy atelectasis or infiltrate which has improved. Remainder of the lungs are clear. There is a tangle of overlying monitor wires. No acute osseous findings. Compare: Perihilar vascular congestion and interstitial edema are no longer seen. As above there is improvement in right basilar opacities. IMPRESSION: 1. Small layering right pleural effusion with overlying hazy atelectasis or infiltrate, improved since 03/27/2023. 2. Stable cardiomegaly. No vascular congestion. 3. Aortic atherosclerosis. Electronically Signed   By: Almira Bar M.D.   On: 04/10/2023 22:02    Pending Labs Unresulted Labs (From admission, onward)  None       Vitals/Pain Today's Vitals   04/10/23 2238 04/10/23 2239 04/10/23 2240 04/10/23 2245  BP:    (!) 115/59  Pulse: 92 91 91 91   Resp: 18 (!) 26 18 (!) 23  Temp:      TempSrc:      SpO2: (!) 86% (!) 83% (!) 87% 98%  Weight:      Height:      PainSc:        Isolation Precautions No active isolations  Medications Medications  oxymetazoline (AFRIN) 0.05 % nasal spray 1 spray (1 spray Each Nare Given 04/11/23 0006)    Mobility walks with device     Focused Assessments Cardiac Assessment Handoff:    Lab Results  Component Value Date   TROPONINI <0.30 09/22/2013   Lab Results  Component Value Date   DDIMER 2.26 (H) 05/27/2018   Does the Patient currently have chest pain? No    R Recommendations: See Admitting Provider Note  Report given to:   Additional Notes:

## 2023-04-12 ENCOUNTER — Other Ambulatory Visit: Payer: Self-pay

## 2023-04-12 DIAGNOSIS — I480 Paroxysmal atrial fibrillation: Secondary | ICD-10-CM | POA: Insufficient documentation

## 2023-04-12 DIAGNOSIS — I5033 Acute on chronic diastolic (congestive) heart failure: Secondary | ICD-10-CM | POA: Diagnosis not present

## 2023-04-12 DIAGNOSIS — Z86718 Personal history of other venous thrombosis and embolism: Secondary | ICD-10-CM

## 2023-04-12 DIAGNOSIS — E876 Hypokalemia: Secondary | ICD-10-CM | POA: Insufficient documentation

## 2023-04-12 DIAGNOSIS — I442 Atrioventricular block, complete: Secondary | ICD-10-CM | POA: Insufficient documentation

## 2023-04-12 LAB — CBC
HCT: 31.7 % — ABNORMAL LOW (ref 36.0–46.0)
Hemoglobin: 10.1 g/dL — ABNORMAL LOW (ref 12.0–15.0)
MCH: 27.8 pg (ref 26.0–34.0)
MCHC: 31.9 g/dL (ref 30.0–36.0)
MCV: 87.3 fL (ref 80.0–100.0)
Platelets: 204 10*3/uL (ref 150–400)
RBC: 3.63 MIL/uL — ABNORMAL LOW (ref 3.87–5.11)
RDW: 18.4 % — ABNORMAL HIGH (ref 11.5–15.5)
WBC: 9.3 10*3/uL (ref 4.0–10.5)
nRBC: 0.3 % — ABNORMAL HIGH (ref 0.0–0.2)

## 2023-04-12 LAB — BASIC METABOLIC PANEL
Anion gap: 11 (ref 5–15)
BUN: 14 mg/dL (ref 6–20)
CO2: 28 mmol/L (ref 22–32)
Calcium: 8.4 mg/dL — ABNORMAL LOW (ref 8.9–10.3)
Chloride: 94 mmol/L — ABNORMAL LOW (ref 98–111)
Creatinine, Ser: 3.37 mg/dL — ABNORMAL HIGH (ref 0.44–1.00)
GFR, Estimated: 15 mL/min — ABNORMAL LOW (ref >60)
Glucose, Bld: 127 mg/dL — ABNORMAL HIGH (ref 70–99)
Potassium: 3.2 mmol/L — ABNORMAL LOW (ref 3.5–5.1)
Sodium: 133 mmol/L — ABNORMAL LOW (ref 135–145)

## 2023-04-12 LAB — PROTIME-INR
INR: 3 — ABNORMAL HIGH (ref 0.8–1.2)
Prothrombin Time: 30.7 seconds — ABNORMAL HIGH (ref 11.4–15.2)

## 2023-04-12 LAB — GLUCOSE, CAPILLARY: Glucose-Capillary: 195 mg/dL — ABNORMAL HIGH (ref 70–99)

## 2023-04-12 MED ORDER — POTASSIUM CHLORIDE CRYS ER 20 MEQ PO TBCR
40.0000 meq | EXTENDED_RELEASE_TABLET | Freq: Once | ORAL | Status: AC
  Administered 2023-04-12: 40 meq via ORAL
  Filled 2023-04-12: qty 2

## 2023-04-12 NOTE — Plan of Care (Signed)
[facility] Patient Discharge Orders- [facility] CLINIC: [facility]  Patient's name: Colleen [name] Admit/DC Dates: 04/10/2023 - 04/12/2023  Discharge Diagnoses: Volume overload  Epistaxis  Aranesp: Given: no   Date and amount of last dose: NA  Last Hgb: 10.1 PRBC's Given: No Date/# of units: NA ESA dose for discharge: mircera same dose units IV q 2 weeks  IV Iron dose at discharge: per protocol  Heparin change: NA  EDW Change: YES New EDW: 59 kg  Bath Change: YES 3.0 K bath with weekly K+ levels   Access intervention/Change: NO Details:  Hectorol/Calcitriol change: NO  Discharge Labs: Calcium 8.0 Phosphorus 5.4 Albumin 2.4 K+ 3.6  IV Antibiotics: No Details:  On Coumadin?: Yes per cards Last INR: Next INR: Managed By:   OTHER/APPTS/LAB ORDERS:    D/C Meds to be reconciled by nurse after every discharge.  Completed By: Alonna Buckler [facility] 04/12/2023, 9:39 AM  [facility] Pager: [phone]  Reviewed by: MD.0:______ RN_______

## 2023-04-12 NOTE — Progress Notes (Signed)
Patient in good spirits after learning 2.9L was removed during last HD, her weight is down this AM, and swelling in lower extremities has decreased. She is hopeful to go home today. Explained we still need to work on her oxygen. Attempted room air but O2 sat dropped to 84. 1.5L nasal cannula replaced.

## 2023-04-12 NOTE — Hospital Course (Signed)
Colleen [name] is a 62 y.o. F with ESRD on HD MWF, recent MVR on warfarin, CHB and pacer, pAF, chronic hypotension, hx VTE, DM who presented with nose bleed and was found to be hypoxic.    3/13: Admitted to [facility] for MV repair, tricuspid valvuloplasty/ring, AV decalcification and removal of LV mass on 3/14 3/21: leadless PPM placed due to CHB 4/16: Discharged from [facility] to [facility] inpatient rehab 5/2: Discharged from CIR    5/3: Presented to UC for epistaxis lasting 8 hours, found to be hypoxic, sent to [facility] 5/4: Transferred to [facility], underwent HD, still hypoxic

## 2023-04-12 NOTE — Assessment & Plan Note (Signed)
Hypoxic and CXR with right pleural effusion, congestion on CXR, rales on exam and orthopnea.  Had had only 0.2kg UF at HD on day of admission, got 2.9kg off overnight. - Volume removal via HD per Nephrology

## 2023-04-12 NOTE — Progress Notes (Addendum)
[facility] Progress Note   Subjective: Seen up in chair, wants to go home but desaturating to 80s on RA. She says she feels well, wants to HD tomorrow at her OP clinic.      Objective Vitals:   04/11/23 2020 04/12/23 0019 04/12/23 0520 04/12/23 0748  BP: (!) 97/53 (!) 110/54 (!) 104/57 (!) 97/57  Pulse: (!) 102 91 94 90  Resp:  16 18 18   Temp:  97.6 F (36.4 C) (!) 97.1 F (36.2 C) 98.3 F (36.8 C)  TempSrc:  Axillary Axillary Axillary  SpO2: 100% 99% 94% 98%  Weight:   59.8 kg   Height:       Physical Exam General: Pleasant, NAD Skin: Sternal incision healing with scabs along suture line Heart: S1,S2 SR on monitor Lungs: CTAB slightly decreased in bases.  Abdomen: NABS Extremities: No LE edema Dialysis Access: L AVF + T/B   Additional Objective Labs: Basic Metabolic Panel: Recent Labs  Lab 04/07/23 1229 04/10/23 2115 04/12/23 0153  NA 132* 134* 133*  K 3.6 4.5 3.2*  CL 90* 92* 94*  CO2 23 28 28   GLUCOSE 266* 257* 127*  BUN 52* 25* 14  CREATININE 8.79* 3.96* 3.37*  CALCIUM 8.0* 8.1* 8.4*  PHOS 5.4*  --   --    Liver Function Tests: Recent Labs  Lab 04/07/23 1229 04/10/23 2115  AST  --  61*  ALT  --  18  ALKPHOS  --  93  BILITOT  --  1.2  PROT  --  8.6*  ALBUMIN 2.4* 3.0*   No results for input(s): "LIPASE", "AMYLASE" in the last 168 hours. CBC: Recent Labs  Lab 04/06/23 0547 04/07/23 1229 04/10/23 2115 04/11/23 0958 04/12/23 0153  WBC 9.0 9.6 8.8 8.0 9.3  NEUTROABS  --   --  6.6  --   --   HGB 10.6* 10.1* 10.8* 10.7* 10.1*  HCT 33.6* 32.1* 35.3* 34.8* 31.7*  MCV 89.6 88.2 90.3 90.9 87.3  PLT 170 173 200 184 204   Blood Culture    Component Value Date/Time   SDES BLOOD RIGHT HAND 12/11/2020 1151   SPECREQUEST  12/11/2020 1151    BOTTLES DRAWN AEROBIC AND ANAEROBIC Blood Culture adequate volume   CULT  12/11/2020 1151    NO GROWTH 5 DAYS Performed at [facility], [address] [place], [place] [address]     REPTSTATUS 12/16/2020 FINAL 12/11/2020 1151    Cardiac Enzymes: No results for input(s): "CKTOTAL", "CKMB", "CKMBINDEX", "TROPONINI" in the last 168 hours. CBG: Recent Labs  Lab 04/08/23 1208 04/08/23 1814 04/08/23 2136 04/09/23 0042 04/09/23 0616  GLUCAP 216* 77 83 138* 82   Iron Studies: No results for input(s): "IRON", "TIBC", "TRANSFERRIN", "FERRITIN" in the last 72 hours. @lablastinr3 @ Studies/Results: DG Chest Port 1 View  Result Date: 04/10/2023 CLINICAL DATA:  Hypotension and hypoxia.  New onset epistaxis. EXAM: PORTABLE CHEST 1 VIEW COMPARISON:  AP Lat 03/27/2023 FINDINGS: There are sternotomy sutures, again noted 2 prosthetic heart valves most likely mitral and aortic, and a cardiac monitoring device in-situ. The heart is enlarged but unchanged. No vascular congestion is seen. The mediastinum is normally outlined. There is calcification of the transverse aorta. Small layering right pleural effusion is again noted. There is overlying hazy atelectasis or infiltrate which has improved. Remainder of the lungs are clear. There is a tangle of overlying monitor wires. No acute osseous findings. Compare: Perihilar vascular congestion and interstitial edema are no longer seen. As  above there is improvement in right basilar opacities. IMPRESSION: 1. Small layering right pleural effusion with overlying hazy atelectasis or infiltrate, improved since 03/27/2023. 2. Stable cardiomegaly. No vascular congestion. 3. Aortic atherosclerosis. Electronically Signed   By: Almira Bar M.D.   On: 04/10/2023 22:02   Medications:   amiodarone  200 mg Oral Daily   aspirin  81 mg Oral Daily   Basaglar KwikPen  8 Units Subcutaneous Daily   [START ON 04/13/2023] cinacalcet  60 mg Oral Q M,W,F   [START ON 04/13/2023] doxercalciferol  3 mcg Intravenous Q M,W,F-HD   famotidine  10 mg Oral Daily   insulin aspart  0-6 Units Subcutaneous TID WC   melatonin  10 mg Oral QHS   midodrine  20 mg Oral TID PC    multivitamin  1 tablet Oral QHS   polycarbophil  625 mg Oral Daily   simethicone  80 mg Oral QID   sodium chloride flush  3 mL Intravenous Q12H   Warfarin - Pharmacist Dosing Inpatient   Does not apply q1600     OP HD: MWF SW 3h  400/1.5  69.5kg   2/2 bath  Heparin 3000  AVF LUA - last OP HD 3/13, post wt 69.5kg - sensipar 60mg  po tiw - hectorol 3 mcg IV tiw - venofer 50 mg IV weekly         Assessment/ Plan: Acute hypoxic resp failure - came here after heart surgery at [facility], was at 77kg and got down to 64kg at the time of discharge a couple of days ago. She returned last night and we did additional HD w/ ~3L off last night and feeling much better. Ok for [employer]. New dry wt will set at 59kg. Will try to lower dry wt 1kg each of the next 3 sessions as outpt and hopefully can help her come off O2 requirement.  It's likely that her true dry wt is in the 55kg range.  Severe valve disease w/ pHTN/ RV dysfunction - sp MVR, sp TV ring and sp AoV decalcification w/ removal of LV mass at [facility] on 02/18/23.  Hypotension - post op, has been rx'd w/ midodrine and Straterra.  CHB - sp leadless PPM placement on 3/21 ESRD - on HD MWF. Next HD 04/13/2023 at OP unit.  Vol excess - LE edema looks almost completely resolved. Hopefully we can continue to lower her dry wt in OP setting and get her off the home O2 over the new couple of weeks DM2 - on insulin H/O DVT/Afib on coumadin. INR therapeutic.   Disposition: Patient adamant about going home. She needs home O2 doubtful DME company would deliver O2 on Sundays. She is 1st shift HD at OP clinic. Volume is stable. Stable to discharge with lower EDW 59 kgs.   Rita H. Brown NP-C 04/12/2023, 9:23 AM  [employer] [phone]

## 2023-04-12 NOTE — TOC Transition Note (Signed)
Transition of Care [facility]) - CM/SW Discharge Note   Patient Details  Name: Colleen [name] MRN: [id number] Date of Birth: 16-Apr-1961  [facility]) CM/SW Contact:  Dianna Limbo [place], [place] Phone Number: [phone] 04/12/2023, 12:22 PM   Clinical Narrative:  [facility] team spoke to patient via telephone about discharge plan. She is verbally responsive and pleasant. Discussed the need for home oxygen. Order placed with [employer]. Discussed the need for home health services. Verified patient will be going to her home and will have the assistance of her friend. Arranbed home health services with [facility]. Pt. Shares she has tramsportation home and will have transportation to medical appointments post-discharge. Patient agrees and verbalizes understaunding of discharge plans. Will continue to monitor for further discharge needs.    Final next level of care: Home w Home Health Services Barriers to Discharge: No Barriers Identified   Patient Goals and CMS Choice      Discharge Placement                         Discharge Plan and Services Additional resources added to the After Visit Summary for                  DME Arranged: Oxygen DME Agency: [employer] Date DME Agency Contacted: 04/12/23 Time DME Agency Contacted: 1030 Representative spoke with at DME Agency: Leavy Cella- [phone]   [facility] Agency: [facility] Date [facility] Agency Contacted: 04/12/23 Time HH Agency Contacted: 1131 Representative spoke with at [facility] Agency: Bjorn [phone]  Social Determinants of Health (SDOH) Interventions SDOH Screenings   Food Insecurity: No Food Insecurity (04/12/2023)  Housing: Low Risk  (04/12/2023)  Transportation Needs: No Transportation Needs (04/12/2023)  Utilities: Not At Risk (04/12/2023)  Tobacco Use: Low Risk  (04/11/2023)     Readmission Risk Interventions     No data to display

## 2023-04-12 NOTE — Discharge Summary (Signed)
Physician Discharge Summary   Patient: Colleen [name] MRN: [id number] DOB: [birth year]  Admit date:     04/10/2023  Discharge date: 04/12/23  Discharge Physician: Alberteen Sam   PCP: Irena Reichmann, DO     Recommendations at discharge:  Follow up with Dr. Zebedee Iba in 1 week Follow up for routine HD as scheduled Follow up with PCP in 1 week     Discharge Diagnoses: Principal Problem:   Acute on chronic diastolic CHF (congestive heart failure) (HCC) Active Problems:   Anemia, chronic disease   ESRD on dialysis (HCC)   HLD (hyperlipidemia)   Hyponatremia   Type 2 diabetes mellitus with hyperglycemia, with long-term current use of insulin (HCC)   S/P MVR (mitral valve repair)   Epistaxis   Hypotension   Complete heart block s/p PPM   History of venous thromboembolism   Paroxysmal atrial fibrillation (HCC)   Hypokalemia     Hospital Course: Colleen [name] is a 62 y.o. F with ESRD on HD MWF, recent MVR on warfarin, CHB and pacer, pAF, congenital right facial nerve palsy, chronic hypotension, hx VTE, DM who presented with nose bleed and was found to be hypoxic.    3/13: Admitted to [facility] for MV repair, tricuspid valvuloplasty/ring, AV decalcification and removal of LV mass on 3/14 3/21: leadless PPM placed due to CHB 4/16: Discharged from [facility] to [facility] inpatient rehab 5/2: Discharged from CIR    5/3: Presented to UC for epistaxis lasting 8 hours, found to be hypoxic, sent to [facility] 5/4: Transferred to [facility], underwent HD, still hypoxic       * Acute on chronic diastolic CHF (congestive heart failure) (HCC) Presented with hypoxia and CXR with right pleural effusion, congestion on CXR, rales on exam and orthopnea.  Respiratory failure ruled out.  Nephrology were consulted, and she went for HD and got 3kg off.  Afterwards, her symptoms resolved, she felt "great" and requested discharge.    This was recommended against as she still required supplemental O2, but patient  was adamant.  She is well known to Nephrology service, and so case was discussed with them and they were confident they could lower her dry weight slowly in the coming days.    CM were able to obtain supplemental O2 for the patient, and she was discharged on new supplemental O2, all her home medicines, and with HD pending tomorrow.      S/P MVR (mitral valve repair) During work up for renal transplant, echo showed severe MR, and so was just recently admitted 2 months ago to [facility] for MV replacement, tricuspid valvuloplasty and ring, AV decalcification and removal of LV mass.            The [facility] Controlled Substances Registry was reviewed for this patient prior to discharge.  Consultants: Nephrology Procedures performed: Dialysis  Disposition: Home health Diet recommendation:  Renal diet  DISCHARGE MEDICATION: Allergies as of 04/12/2023       Reactions   Farxiga [dapagliflozin] Other (See Comments)   Cannot take due to kidneys   Hydrocodone Other (See Comments)   "Increase Glucose level," per pt   Kombiglyze Xr [saxagliptin-metformin Er] Other (See Comments)   Cannot take due to kidneys        Medication List     TAKE these medications    amiodarone 200 MG tablet Commonly known as: PACERONE Take 1 tablet (200 mg total) by mouth daily.   Aspirin Low Dose 81 MG chewable tablet Generic drug: aspirin  Chew 1 tablet (81 mg total) by mouth daily.   atomoxetine 18 MG capsule Commonly known as: STRATTERA Take 1 capsule (18 mg total) by mouth daily.   Basaglar KwikPen 100 UNIT/ML Inject 8 Units into the skin daily. Pen expires 28 days after first use   cinacalcet 30 MG tablet Commonly known as: SENSIPAR Take 60 mg by mouth every Monday, Wednesday, and Friday.   Deep Sea Nasal Spray 0.65 % nasal spray Generic drug: sodium chloride Place 1 spray into both nostrils as needed for congestion.   Heartburn Relief 10 MG tablet Generic drug: famotidine Take  1 tablet (10 mg total) by mouth daily.   Insulin Pen Needle 32G X 4 MM Misc 1 Application by Does not apply route at bedtime.   leptospermum manuka honey Pste paste Apply 1 Application topically daily. To left elbow wound and cover with dry dressing.   melatonin 5 MG Tabs Take 2 tablets (10 mg total) by mouth at bedtime.   midodrine 10 MG tablet Commonly known as: PROAMATINE Take 2 tablets (20 mg total) by mouth every 8 (eight) hours. New medication. On discharge orders from [facility] 03/24/23.   multivitamin Tabs tablet Take 1 tablet by mouth at bedtime.   polycarbophil 625 MG tablet Commonly known as: FIBERCON Take 1 tablet (625 mg total) by mouth daily.   simethicone 80 MG chewable tablet Commonly known as: MYLICON Chew 1 tablet (80 mg total) by mouth 4 (four) times daily.   Vitamin D (Ergocalciferol) 1.25 MG (50000 UNIT) Caps capsule Commonly known as: DRISDOL Take 1 capsule (50,000 Units total) by mouth every 7 (seven) days. Start taking on: Apr 15, 2023   warfarin 4 MG tablet Commonly known as: COUMADIN Take with supper--take 1/2 tablet on Mon,Wed and Friday. Take whole tablet on Tue, Thus, Sat, Sun. What changed:  how much to take how to take this when to take this additional instructions               Durable Medical Equipment  (From admission, onward)           Start     Ordered   04/12/23 0957  DME Oxygen  Once       Question Answer Comment  Length of Need 6 Months   Mode or (Route) Nasal cannula   Liters per Minute 2   Frequency Continuous (stationary and portable oxygen unit needed)   Oxygen delivery system Gas      04/12/23 0956              Discharge Care Instructions  (From admission, onward)           Start     Ordered   04/12/23 0000  Discharge wound care:       Comments: As previously recommended by Dr. Zebedee Iba   04/12/23 0956            Follow-up Information     Irena Reichmann, DO Follow up.   Specialty: Family  Medicine Contact information: [address] [place] [address] [phone]         Philip Aspen, MD. Call.   Specialty: Cardiothoracic Surgery Contact information: [address] [place] [address] [phone]                 Discharge Instructions     Discharge wound care:   Complete by: As directed    As previously recommended by Dr. Zebedee Iba   Increase activity slowly  Complete by: As directed        Discharge Exam: Filed Weights   04/10/23 2013 04/11/23 1820 04/12/23 0520  Weight: 63.9 kg 60.2 kg 59.8 kg    General: Pt is alert, awake, not in acute distress Cardiovascular: RRR, nl S1-S2, no murmurs appreciated.   No LE edema.   Respiratory: Normal respiratory rate and rhythm.  Crackles bilaterally just at bases. Abdominal: Abdomen soft and non-tender.  No distension or HSM.   Neuro/Psych: Strength symmetric in upper and lower extremities.  Judgment and insight appear normal, chronic left facial droop.   Condition at discharge: fair  The results of significant diagnostics from this hospitalization (including imaging, microbiology, ancillary and laboratory) are listed below for reference.   Imaging Studies: DG Chest Port 1 View  Result Date: 04/10/2023 CLINICAL DATA:  Hypotension and hypoxia.  New onset epistaxis. EXAM: PORTABLE CHEST 1 VIEW COMPARISON:  AP Lat 03/27/2023 FINDINGS: There are sternotomy sutures, again noted 2 prosthetic heart valves most likely mitral and aortic, and a cardiac monitoring device in-situ. The heart is enlarged but unchanged. No vascular congestion is seen. The mediastinum is normally outlined. There is calcification of the transverse aorta. Small layering right pleural effusion is again noted. There is overlying hazy atelectasis or infiltrate which has improved. Remainder of the lungs are clear. There is a tangle of overlying monitor wires. No acute osseous findings. Compare: Perihilar  vascular congestion and interstitial edema are no longer seen. As above there is improvement in right basilar opacities. IMPRESSION: 1. Small layering right pleural effusion with overlying hazy atelectasis or infiltrate, improved since 03/27/2023. 2. Stable cardiomegaly. No vascular congestion. 3. Aortic atherosclerosis. Electronically Signed   By: Almira Bar M.D.   On: 04/10/2023 22:02   DG Chest 2 View  Result Date: 03/27/2023 CLINICAL DATA:  Shortness of breath. EXAM: CHEST - 2 VIEW COMPARISON:  CTA chest 12/08/2020.  One-view chest x-ray 12/08/2020. FINDINGS: The heart is enlarged. Monitoring device is in place. Atherosclerotic calcifications are present at the aortic arch. Aortic valve is noted. A right pleural effusion is present. Asymmetric interstitial edema is present on the right. Dependent airspace disease is noted in the lower lobe. Mild pulmonary vascular congestion is present on the left without focal airspace disease. Surgical clips are present in the right axilla. The visualized soft tissues and bony thorax are otherwise unremarkable. IMPRESSION: 1. Cardiomegaly with asymmetric interstitial edema and right pleural effusion compatible with congestive heart failure. 2. Dependent airspace disease in the right lower lobe likely reflects atelectasis. Infection is not excluded. Electronically Signed   By: Marin Roberts M.D.   On: 03/27/2023 10:41    Microbiology: Results for orders placed or performed during the hospital encounter of 12/08/20  Resp Panel by RT-PCR (Flu A&B, Covid) Nasopharyngeal Swab     Status: None   Collection Time: 12/08/20  5:55 PM   Specimen: Nasopharyngeal Swab; Nasopharyngeal(NP) swabs in vial transport medium  Result Value Ref Range Status   SARS Coronavirus 2 by RT PCR NEGATIVE NEGATIVE Final    Comment: (NOTE) SARS-CoV-2 target nucleic acids are NOT DETECTED.  The SARS-CoV-2 RNA is generally detectable in upper respiratory specimens during the acute  phase of infection. The lowest concentration of SARS-CoV-2 viral copies this assay can detect is 138 copies/mL. A negative result does not preclude SARS-Cov-2 infection and should not be used as the sole basis for treatment or other patient management decisions. A negative result may occur with  improper specimen collection/handling, submission of  specimen other than nasopharyngeal swab, presence of viral mutation(s) within the areas targeted by this assay, and inadequate number of viral copies(<138 copies/mL). A negative result must be combined with clinical observations, patient history, and epidemiological information. The expected result is Negative.  Fact Sheet for Patients:  BloggerCourse.com  Fact Sheet for Healthcare Providers:  SeriousBroker.it  This test is no t yet approved or cleared by the [place] FDA and  has been authorized for detection and/or diagnosis of SARS-CoV-2 by FDA under an Emergency Use Authorization (EUA). This EUA will remain  in effect (meaning this test can be used) for the duration of the COVID-19 declaration under Section 564(b)(1) of the Act, 21 U.S.C.section 360bbb-3(b)(1), unless the authorization is terminated  or revoked sooner.       Influenza A by PCR NEGATIVE NEGATIVE Final   Influenza B by PCR NEGATIVE NEGATIVE Final    Comment: (NOTE) The Xpert Xpress SARS-CoV-2/FLU/RSV plus assay is intended as an aid in the diagnosis of influenza from Nasopharyngeal swab specimens and should not be used as a sole basis for treatment. Nasal washings and aspirates are unacceptable for Xpert Xpress SARS-CoV-2/FLU/RSV testing.  Fact Sheet for Patients: BloggerCourse.com  Fact Sheet for Healthcare Providers: SeriousBroker.it  This test is not yet approved or cleared by the [place] FDA and has been authorized for detection and/or diagnosis of  SARS-CoV-2 by FDA under an Emergency Use Authorization (EUA). This EUA will remain in effect (meaning this test can be used) for the duration of the COVID-19 declaration under Section 564(b)(1) of the Act, 21 U.S.C. section 360bbb-3(b)(1), unless the authorization is terminated or revoked.  Performed at [facility], [address], [place], [place] [address]   Culture, blood (routine x 2)     Status: None   Collection Time: 12/09/20  3:56 PM   Specimen: BLOOD RIGHT HAND  Result Value Ref Range Status   Specimen Description BLOOD RIGHT HAND  Final   Special Requests   Final    BOTTLES DRAWN AEROBIC AND ANAEROBIC Blood Culture adequate volume   Culture   Final    NO GROWTH 6 DAYS Performed at [facility], [address] [place], [place] [address]    Report Status 12/15/2020 FINAL  Final  MRSA PCR Screening     Status: None   Collection Time: 12/09/20  5:45 PM   Specimen: Nasopharyngeal  Result Value Ref Range Status   MRSA by PCR NEGATIVE NEGATIVE Final    Comment:        The GeneXpert MRSA Assay (FDA approved for NASAL specimens only), is one component of a comprehensive MRSA colonization surveillance program. It is not intended to diagnose MRSA infection nor to guide or monitor treatment for MRSA infections. Performed at [facility], [address], [place], [place] [address]   Culture, blood (routine x 2)     Status: None   Collection Time: 12/11/20 11:45 AM   Specimen: BLOOD  Result Value Ref Range Status   Specimen Description BLOOD RIGHT ANTECUBITAL  Final   Special Requests   Final    AEROBIC BOTTLE ONLY Blood Culture results may not be optimal due to an inadequate volume of blood received in culture bottles   Culture   Final    NO GROWTH 5 DAYS Performed at [facility], [address], [place], [place] [address]    Report Status 12/16/2020 FINAL  Final  Culture, blood (routine x 2)  Status: None   Collection Time: 12/11/20  11:51 AM   Specimen: BLOOD RIGHT HAND  Result Value Ref Range Status   Specimen Description BLOOD RIGHT HAND  Final   Special Requests   Final    BOTTLES DRAWN AEROBIC AND ANAEROBIC Blood Culture adequate volume   Culture   Final    NO GROWTH 5 DAYS Performed at [facility], [address] [place], [place] [address]    Report Status 12/16/2020 FINAL  Final    Labs: CBC: Recent Labs  Lab 04/06/23 0547 04/07/23 1229 04/10/23 2115 04/11/23 0958 04/12/23 0153  WBC 9.0 9.6 8.8 8.0 9.3  NEUTROABS  --   --  6.6  --   --   HGB 10.6* 10.1* 10.8* 10.7* 10.1*  HCT 33.6* 32.1* 35.3* 34.8* 31.7*  MCV 89.6 88.2 90.3 90.9 87.3  PLT 170 173 200 184 204   Basic Metabolic Panel: Recent Labs  Lab 04/07/23 1229 04/10/23 2115 04/12/23 0153  NA 132* 134* 133*  K 3.6 4.5 3.2*  CL 90* 92* 94*  CO2 23 28 28   GLUCOSE 266* 257* 127*  BUN 52* 25* 14  CREATININE 8.79* 3.96* 3.37*  CALCIUM 8.0* 8.1* 8.4*  PHOS 5.4*  --   --    Liver Function Tests: Recent Labs  Lab 04/07/23 1229 04/10/23 2115  AST  --  61*  ALT  --  18  ALKPHOS  --  93  BILITOT  --  1.2  PROT  --  8.6*  ALBUMIN 2.4* 3.0*   CBG: Recent Labs  Lab 04/08/23 1814 04/08/23 2136 04/09/23 0042 04/09/23 0616 04/12/23 1210  GLUCAP 77 83 138* 82 195*    Discharge time spent: approximately 35 minutes spent on discharge counseling, evaluation of patient on day of discharge, and coordination of discharge planning with nursing, social work, pharmacy and case management  Signed: Alberteen Sam, MD [facility] 04/12/2023

## 2023-04-12 NOTE — Assessment & Plan Note (Signed)
During work up for renal transplant, echo showed severe MR, and so was just recently admitted 2 months ago to [facility] for MV replacement, tricuspid valvuloplasty and ring, AV decalcification and removal of LV mass.

## 2023-04-12 NOTE — Progress Notes (Signed)
SATURATION QUALIFICATIONS: (This note is used to comply with regulatory documentation for home oxygen)  Patient Saturations on Room Air at Rest = 86%  Patient Saturations on Room Air while Ambulating = 80 %  Patient Saturations on 2 Liters of oxygen while Ambulating = 94%  Please briefly explain why patient needs home oxygen: patient's oxygen saturation decreased to 80% on Room air with ambulation.

## 2023-04-12 NOTE — Evaluation (Signed)
Physical Therapy Evaluation & Discharge Patient Details Name: Colleen [name] MRN: [id number] DOB: 08-02-1961 Today's Date: 04/12/2023  History of Present Illness  Pt is a 62 y.o. female admitted 04/10/23 with SOB, epistaxis. Of note, recent admission for MVR (02/19/23 at [facility]) and pacemaker placement with d/c from [facility] on 04/09/23. Other PMH includes DM, ESRD (HD MWF), HTN, VTE on Coumadin, HLD.   Clinical Impression  Patient evaluated by Physical Therapy with no further acute PT needs identified. PTA, pt recently d/c home from [facility], friend is staying with her to provide 24/7 assist as she continues to recover, pt ambulating household distances with RW. Today, pt moving fairly well, ambulatory with RW and min guard for balance; asymptomatic. Pt extremely motivated to discharge home today and continue with HHPT services. All education has been completed and the patient has no further questions. Acute PT is signing off. Thank you for this referral.     SATURATION QUALIFICATIONS:  Patient Saturations on Room Air at Rest = 86% Patient Saturations on Room Air while Ambulating = 80% Patient Saturations on 2 Liters of oxygen while Ambulating = 94%  Orthostatic BPs Sitting 94/53 (66)  Standing 98/53 (68)  Post-ambulation 103/46 (64)      Recommendations for follow up therapy are one component of a multi-disciplinary discharge planning process, led by the attending physician.  Recommendations may be updated based on patient status, additional functional criteria and insurance authorization.  Assistance Recommended at Discharge Intermittent Supervision/Assistance  Patient can return home with the following  A little help with walking and/or transfers;A little help with bathing/dressing/bathroom;Assistance with cooking/housework;Help with stairs or ramp for entrance;Assist for transportation    Equipment Recommendations None recommended by PT  Recommendations for Other Services       Functional  Status Assessment       Precautions / Restrictions Precautions Precautions: Sternal;Fall;ICD/Pacemaker;Other (comment) Precaution Comments: watch SpO2 (does not wear O2 baseline) Restrictions Weight Bearing Restrictions: No      Mobility  Bed Mobility Overal bed mobility: Modified Independent                  Transfers Overall transfer level: Needs assistance Equipment used: Rolling walker (2 wheels) Transfers: Sit to/from Stand Sit to Stand: Min guard           General transfer comment: pt really wanting EOB elevated and initially pulling up on RW to stand from EOB, when encouraged to try again from lower height with hands pushing from bed, pt asks, "Why do you want to make it harder on me? Shouldn't it be as easy as possible?" educ on sternal precautions and sequencing to maintain with sit<>stand. pt able to perform additional sit<>stand from EOB to RW and 2x sit<>stand from recliner to RW with standby assist    Ambulation/Gait Ambulation/Gait assistance: Min guard Gait Distance (Feet): 40 Feet Assistive device: Rolling walker (2 wheels) Gait Pattern/deviations: Step-through pattern, Decreased stride length, Trunk flexed Gait velocity: decreased     General Gait Details: initial pivotal steps from bed>recliner with RW and min guard, seated rest to check vitals. additional ambulation 40' in room with RW, slow guarded gait with heavy reliance on UE support  Stairs            Wheelchair Mobility    Modified Rankin (Stroke Patients Only)       Balance Overall balance assessment: Needs assistance Sitting-balance support: No upper extremity supported Sitting balance-Leahy Scale: Good Sitting balance - Comments: pt able to don bilateral  socks with encouragement to perform independent   Standing balance support: No upper extremity supported, Bilateral upper extremity supported, During functional activity Standing balance-Leahy Scale: Fair Standing balance  comment: can static stand without UE support; static and dynamic stability improved with RW                             Pertinent Vitals/Pain Pain Assessment Pain Assessment: No/denies pain    Home Living Family/patient expects to be discharged to:: Private residence Living Arrangements: Non-relatives/Friends Available Help at Discharge: Friend(s);Available 24 hours/day Type of Home: Apartment Home Access: Level entry       Home Layout: One level Home Equipment: Agricultural consultant (2 wheels);Wheelchair - manual;BSC/3in1      Prior Function Prior Level of Function : Needs assist             Mobility Comments: mod indep ambulating limited household distances with RW, pushed in w/c by friend for community mobility; friend drives; wants to get back to not needing walker. HHPT supposed to start ADLs Comments: bird bathing at sink (not supposed to get incisions wet), friend supervises and assists with ADL/iADLs as needed     Hand Dominance        Extremity/Trunk Assessment   Upper Extremity Assessment Upper Extremity Assessment: Generalized weakness    Lower Extremity Assessment Lower Extremity Assessment: Generalized weakness       Communication   Communication: No difficulties (slightly slurred)  Cognition Arousal/Alertness: Awake/alert Behavior During Therapy: WFL for tasks assessed/performed Overall Cognitive Status: No family/caregiver present to determine baseline cognitive functioning                                 General Comments: WFL for simple tasks, not formally assessed. very particular about certain things asking many questions, ultimately agreeable to try new ways of doing things        General Comments General comments (skin integrity, edema, etc.): educ re: role of acute PT, POC, activity recommendations, discharge planning and DME needs - pt adamant about return home today. SpO2 80% on RA with activity, >/94% on 2L O2 Colleen [name]; BP  low but stable, pt denies dizziness with mobility    Exercises     Assessment/Plan    PT Assessment All further PT needs can be met in the next venue of care  PT Problem List Decreased strength;Decreased activity tolerance;Decreased balance;Decreased mobility;Cardiopulmonary status limiting activity       PT Treatment Interventions      PT Goals (Current goals can be found in the Care Plan section)  Acute Rehab PT Goals PT Goal Formulation: All assessment and education complete, DC therapy    Frequency       Co-evaluation               AM-PAC PT "6 Clicks" Mobility  Outcome Measure Help needed turning from your back to your side while in a flat bed without using bedrails?: None Help needed moving from lying on your back to sitting on the side of a flat bed without using bedrails?: None Help needed moving to and from a bed to a chair (including a wheelchair)?: A Little Help needed standing up from a chair using your arms (e.g., wheelchair or bedside chair)?: A Little Help needed to walk in hospital room?: A Little Help needed climbing 3-5 steps with a railing? : A Little 6  Click Score: 20    End of Session Equipment Utilized During Treatment: Gait belt Activity Tolerance: Patient tolerated treatment well Patient left: in chair;with call bell/phone within reach Nurse Communication: Mobility status PT Visit Diagnosis: Other abnormalities of gait and mobility (R26.89);Muscle weakness (generalized) (M62.81)    Time: 1610-9604 PT Time Calculation (min) (ACUTE ONLY): 29 min   Charges:   PT Evaluation $PT Eval Moderate Complexity: 1 Mod PT Treatments $Therapeutic Activity: 8-22 mins      Ina Homes, PT, DPT Acute Rehabilitation Services  Personal: Secure Chat Rehab Office: [phone]  Malachy Chamber 04/12/2023, 10:02 AM

## 2023-04-12 NOTE — Progress Notes (Signed)
Patient on 1L [place] oxygen, saturations 94%. Placed on room air, sats decreased to 86% after 5 minutes, patient placed back on 1L [place].

## 2023-04-13 ENCOUNTER — Other Ambulatory Visit (HOSPITAL_COMMUNITY): Payer: Self-pay

## 2023-04-13 LAB — GLUCOSE, CAPILLARY
Glucose-Capillary: 239 mg/dL — ABNORMAL HIGH (ref 70–99)
Glucose-Capillary: 146 mg/dL — ABNORMAL HIGH (ref 70–99)
Glucose-Capillary: 133 mg/dL — ABNORMAL HIGH (ref 70–99)

## 2023-04-13 NOTE — Care Management (Signed)
Received a call from the patient's mother noting the patient did not get all of the prescriptions called into her pharmacy at discharge. Mother asking about Cinacalcet and polycarophil specifically. Notified that cinacalcet (sensispar) will be given at HD; M-W-F and polycarophil (fibercon) is indeed over the counter which can be purchased at the pharmacy when picking up other medication called into the pharmacy.  Instructed to call back for any additional questions. Pamelia Hoit

## 2023-04-14 LAB — HEPATITIS B SURFACE ANTIBODY, QUANTITATIVE: Hep B S AB Quant (Post): 264 m[IU]/mL (ref >9.9)

## 2023-04-15 ENCOUNTER — Telehealth (HOSPITAL_COMMUNITY): Payer: Self-pay

## 2023-04-15 NOTE — Telephone Encounter (Signed)
Pt is not interested in the cardiac rehab referral. Dismissed cardiac rehab referral from [facility].

## 2023-04-15 NOTE — Telephone Encounter (Signed)
Outside/paper referral received from [facility]. Will fax over Physician order and request further documents. Insurance benefits and eligibility to be determined.

## 2023-04-19 ENCOUNTER — Other Ambulatory Visit: Payer: Self-pay

## 2023-04-19 ENCOUNTER — Emergency Department (HOSPITAL_BASED_OUTPATIENT_CLINIC_OR_DEPARTMENT_OTHER)
Admission: EM | Admit: 2023-04-19 | Discharge: 2023-04-19 | Disposition: A | Discharge status: Home / Self Care | Payer: Medicare Other | Attending: Emergency Medicine | Admitting: Emergency Medicine

## 2023-04-19 ENCOUNTER — Ambulatory Visit
Admission: EM | Admit: 2023-04-19 | Discharge: 2023-04-19 | Disposition: A | Discharge status: Home / Self Care | Payer: Medicare Other | Attending: Family Medicine | Admitting: Family Medicine

## 2023-04-19 ENCOUNTER — Encounter (HOSPITAL_BASED_OUTPATIENT_CLINIC_OR_DEPARTMENT_OTHER): Payer: Self-pay | Admitting: Emergency Medicine

## 2023-04-19 DIAGNOSIS — Z79899 Other long term (current) drug therapy: Secondary | ICD-10-CM | POA: Diagnosis not present

## 2023-04-19 DIAGNOSIS — E119 Type 2 diabetes mellitus without complications: Secondary | ICD-10-CM | POA: Insufficient documentation

## 2023-04-19 DIAGNOSIS — I10 Essential (primary) hypertension: Secondary | ICD-10-CM | POA: Diagnosis not present

## 2023-04-19 DIAGNOSIS — Z7982 Long term (current) use of aspirin: Secondary | ICD-10-CM | POA: Insufficient documentation

## 2023-04-19 DIAGNOSIS — R040 Epistaxis: Secondary | ICD-10-CM | POA: Diagnosis present

## 2023-04-19 DIAGNOSIS — Z7901 Long term (current) use of anticoagulants: Secondary | ICD-10-CM | POA: Diagnosis not present

## 2023-04-19 DIAGNOSIS — H9222 Otorrhagia, left ear: Secondary | ICD-10-CM

## 2023-04-19 DIAGNOSIS — Z794 Long term (current) use of insulin: Secondary | ICD-10-CM | POA: Diagnosis not present

## 2023-04-19 LAB — BASIC METABOLIC PANEL
Anion gap: 17 — ABNORMAL HIGH (ref 5–15)
BUN: 50 mg/dL — ABNORMAL HIGH (ref 6–20)
CO2: 24 mmol/L (ref 22–32)
Calcium: 8.7 mg/dL — ABNORMAL LOW (ref 8.9–10.3)
Chloride: 96 mmol/L — ABNORMAL LOW (ref 98–111)
Creatinine, Ser: 7.1 mg/dL — ABNORMAL HIGH (ref 0.44–1.00)
GFR, Estimated: 6 mL/min — ABNORMAL LOW (ref >60)
Glucose, Bld: 259 mg/dL — ABNORMAL HIGH (ref 70–99)
Potassium: 3.8 mmol/L (ref 3.5–5.1)
Sodium: 137 mmol/L (ref 135–145)

## 2023-04-19 LAB — CBC WITH DIFFERENTIAL/PLATELET
Abs Immature Granulocytes: 0.04 10*3/uL (ref 0.00–0.07)
Basophils Absolute: 0.1 10*3/uL (ref 0.0–0.1)
Basophils Relative: 2 %
Eosinophils Absolute: 0.1 10*3/uL (ref 0.0–0.5)
Eosinophils Relative: 1 %
HCT: 32.6 % — ABNORMAL LOW (ref 36.0–46.0)
Hemoglobin: 10.1 g/dL — ABNORMAL LOW (ref 12.0–15.0)
Immature Granulocytes: 1 %
Lymphocytes Relative: 15 %
Lymphs Abs: 1.3 10*3/uL (ref 0.7–4.0)
MCH: 26.7 pg (ref 26.0–34.0)
MCHC: 31 g/dL (ref 30.0–36.0)
MCV: 86.2 fL (ref 80.0–100.0)
Monocytes Absolute: 0.7 10*3/uL (ref 0.1–1.0)
Monocytes Relative: 9 %
Neutro Abs: 6 10*3/uL (ref 1.7–7.7)
Neutrophils Relative %: 72 %
Platelets: 194 10*3/uL (ref 150–400)
RBC: 3.78 MIL/uL — ABNORMAL LOW (ref 3.87–5.11)
RDW: 17.4 % — ABNORMAL HIGH (ref 11.5–15.5)
WBC: 8.3 10*3/uL (ref 4.0–10.5)
nRBC: 0 % (ref 0.0–0.2)

## 2023-04-19 LAB — PROTIME-INR
INR: 2.3 — ABNORMAL HIGH (ref 0.8–1.2)
Prothrombin Time: 25.8 seconds — ABNORMAL HIGH (ref 11.4–15.2)

## 2023-04-19 MED ORDER — OXYMETAZOLINE HCL 0.05 % NA SOLN
1.0000 | Freq: Once | NASAL | Status: AC
  Administered 2023-04-19: 1 via NASAL
  Filled 2023-04-19: qty 30

## 2023-04-19 NOTE — ED Notes (Signed)
ED Provider at bedside. 

## 2023-04-19 NOTE — Discharge Instructions (Addendum)
Try to place her nasal cannula only in your left nare, use facemask when able to as discussed.  If bleeding occurs again blow out your nose of all your blood clots.  Spray your nose with 1 or 2 sprays of Afrin.  And hold direct pressure for 15 minutes.  As discussed you can spray some Afrin on pledget or tissue paper and placed that up your nostril as well and hold pressure for 10 to 15 minutes as well.  If bleeding has been difficult to control please return for evaluation.  Follow-up with ear nose and throat doctor, Dr. Marene Lenz with ear nose and throat.

## 2023-04-19 NOTE — Discharge Instructions (Signed)
Patient will apply a dropper to of hydroperoxide once daily to try to slowly dissolve the blood clot.

## 2023-04-19 NOTE — ED Triage Notes (Signed)
Pt reports the left ear was bleeding last night. Denies any pain and bleeding at this moment.   Pt requested door to be open in the room.

## 2023-04-19 NOTE — ED Provider Notes (Signed)
[facility] Provider Note   CSN: [id number] Arrival date & time: 04/19/23  1332     History  Chief Complaint  Patient presents with   Epistaxis    Colleen [name] is a 62 y.o. female.  Patient here with nosebleed from her right nare.  She is on Coumadin for mechanical heart valve.  She has end-stage renal disease on hemodialysis.  She actually has some bleeding from her left ear last night after using a Q-tip.  She went to urgent care for that and overall sounds like she has a blood clot in her left ear.  She has not been having any bleeding.  She has been having some nosebleeds recently.  Had 1 last week.  They were able to get it to stop with direct pressure and did not need nasal tampon.  She also has a history of hypertension diabetes and blood clots.  Of note, patient is also chronically on oxygen 2 L.  The history is provided by the patient.       Home Medications Prior to Admission medications   Medication Sig Start Date End Date Taking? Authorizing Provider  amiodarone (PACERONE) 200 MG tablet Take 1 tablet (200 mg total) by mouth daily. 04/08/23   Love, Evlyn Kanner, PA-C  aspirin 81 MG chewable tablet Chew 1 tablet (81 mg total) by mouth daily. 04/08/23   Love, Evlyn Kanner, PA-C  atomoxetine (STRATTERA) 18 MG capsule Take 1 capsule (18 mg total) by mouth daily. 04/09/23   Love, Evlyn Kanner, PA-C  multivitamin (RENA-VIT) TABS tablet Take 1 tablet by mouth at bedtime. 04/08/23   Jacquelynn Cree, PA-C  cinacalcet (SENSIPAR) 30 MG tablet Take 60 mg by mouth every Monday, Wednesday, and Friday.    [provider]  famotidine (PEPCID) 10 MG tablet Take 1 tablet (10 mg total) by mouth daily. 04/08/23   Love, Evlyn Kanner, PA-C  Insulin Glargine (BASAGLAR KWIKPEN) 100 UNIT/ML Inject 8 Units into the skin daily. Pen expires 28 days after first use 04/08/23   Love, Evlyn Kanner, PA-C  Insulin Pen Needle 32G X 4 MM MISC 1 Application by Does not apply route  at bedtime. 04/09/23   Love, Evlyn Kanner, PA-C  leptospermum manuka honey (MEDIHONEY) PSTE paste Apply 1 Application topically daily. To left elbow wound and cover with dry dressing. 04/09/23   Love, Evlyn Kanner, PA-C  melatonin 5 MG TABS Take 2 tablets (10 mg total) by mouth at bedtime. 04/08/23   Love, Evlyn Kanner, PA-C  midodrine (PROAMATINE) 10 MG tablet Take 2 tablets (20 mg total) by mouth every 8 (eight) hours. New medication. On discharge orders from [facility] 03/24/23. 04/08/23   Love, Evlyn Kanner, PA-C  polycarbophil (FIBERCON) 625 MG tablet Take 1 tablet (625 mg total) by mouth daily. 04/09/23   Love, Evlyn Kanner, PA-C  simethicone (MYLICON) 80 MG chewable tablet Chew 1 tablet (80 mg total) by mouth 4 (four) times daily. 04/08/23   Love, Evlyn Kanner, PA-C  sodium chloride (OCEAN) 0.65 % SOLN nasal spray Place 1 spray into both nostrils as needed for congestion. 04/08/23   Love, Evlyn Kanner, PA-C  Vitamin D, Ergocalciferol, (DRISDOL) 1.25 MG (50000 UNIT) CAPS capsule Take 1 capsule (50,000 Units total) by mouth every 7 (seven) days. 04/15/23   Love, Evlyn Kanner, PA-C  warfarin (COUMADIN) 4 MG tablet Take with supper--take 1/2 tablet on Mon,Wed and Friday. Take whole tablet on Tue, Thus, Sat, Sun. Patient taking differently: Take  2-4 mg by mouth See admin instructions. Take with supper--take 2mg  by mouth on Mon,Wed and Friday. Take 4mg  by mouth on Tue, Thus, Sat, Sun. 04/09/23   Love, Evlyn Kanner, PA-C      Allergies    Dust mite extract, Farxiga [dapagliflozin], Gramineae pollens, Hydrocodone, and Kombiglyze xr [saxagliptin-metformin er]    Review of Systems   Review of Systems  Physical Exam Updated Vital Signs BP 124/67 (BP Location: Right Arm)   Pulse 100   Temp 97.9 F (36.6 C) (Temporal)   Resp 18   Ht 5\' 4"  (1.626 m)   Wt 59.4 kg   LMP 12/08/2013   SpO2 90%   BMI 22.49 kg/m  Physical Exam Vitals and nursing note reviewed.  Constitutional:      General: She is not in acute distress.    Appearance: She is  well-developed.  HENT:     Head: Normocephalic and atraumatic.     Ears:     Comments: She has what appears to be waxy/blood clot type material on the left ear without any bleeding, she is having active bleeding from the right nare, there is no bleeding to the posterior oropharynx of the left nare Eyes:     Extraocular Movements: Extraocular movements intact.     Conjunctiva/sclera: Conjunctivae normal.     Pupils: Pupils are equal, round, and reactive to light.  Cardiovascular:     Rate and Rhythm: Normal rate and regular rhythm.     Heart sounds: No murmur heard. Pulmonary:     Effort: Pulmonary effort is normal. No respiratory distress.     Breath sounds: Normal breath sounds.  Abdominal:     Palpations: Abdomen is soft.     Tenderness: There is no abdominal tenderness.  Musculoskeletal:        General: No swelling.     Cervical back: Neck supple.  Skin:    General: Skin is warm and dry.     Capillary Refill: Capillary refill takes less than 2 seconds.  Neurological:     Mental Status: She is alert.  Psychiatric:        Mood and Affect: Mood normal.     ED Results / Procedures / Treatments   Labs (all labs ordered are listed, but only abnormal results are displayed) Labs Reviewed  CBC WITH DIFFERENTIAL/PLATELET - Abnormal; Notable for the following components:      Result Value   RBC 3.78 (*)    Hemoglobin 10.1 (*)    HCT 32.6 (*)    RDW 17.4 (*)    All other components within normal limits  BASIC METABOLIC PANEL - Abnormal; Notable for the following components:   Chloride 96 (*)    Glucose, Bld 259 (*)    BUN 50 (*)    Creatinine, Ser 7.10 (*)    Calcium 8.7 (*)    GFR, Estimated 6 (*)    Anion gap 17 (*)    All other components within normal limits  PROTIME-INR - Abnormal; Notable for the following components:   Prothrombin Time 25.8 (*)    INR 2.3 (*)    All other components within normal limits    EKG None  Radiology No results  found.  Procedures Procedures    Medications Ordered in ED Medications  oxymetazoline (AFRIN) 0.05 % nasal spray 1 spray (1 spray Each Nare Given by Other 04/19/23 1357)    ED Course/ Medical Decision Making/ A&P  Medical Decision Making Amount and/or Complexity of Data Reviewed Labs: ordered.  Risk OTC drugs.   Colleen [name] is here with right-sided nosebleed.  She is on Coumadin for mechanical heart valve, blood clot history.  History of end-stage renal disease on hemodialysis.  She has active bleeding from the right nare.  We were able to use Afrin and nasal pledget and pressure for 15 minutes to get bleeding to stop.  Basic labs were drawn and per my review and interpretation are unremarkable.  Coumadin is 2.3.  Overall she was observed in the ED for a while without any rebleeding.  She was given instructions on what to do if she started to bleed again.  Unfortunately since she is on blood thinner for what sounds like a mechanical heart valve she will need to continue Coumadin.  Patient discharged in good condition.  Will have her follow-up with ENT regarding blood clot in the left ear and nosebleeds.  This chart was dictated using voice recognition software.  Despite best efforts to proofread,  errors can occur which can change the documentation meaning.         Final Clinical Impression(s) / ED Diagnoses Final diagnoses:  Right-sided epistaxis    Rx / DC Orders ED Discharge Orders     None         Virgina Norfolk, DO 04/19/23 1454

## 2023-04-19 NOTE — ED Provider Notes (Signed)
[facility]    CSN: [id number] Arrival date & time: 04/19/23  1036      History   Chief Complaint Chief Complaint  Patient presents with   Ear Problem    HPI Colleen [name] is a 62 y.o. female.   HPI Here for some bleeding from her left ear.  Yesterday evening she had cleaned her left ear with a Q-tip, and then in the next hour or 2 she noted some blood coming out of that left ear.  It did quit bleeding within the hour or 2.  No fever or cough and no ear pain.  She is on a warfarin.  No bleeding from her gums and no hematuria and no blood in her stool.  She is now on continuous home oxygen for the time being.    Past Medical History:  Diagnosis Date   Anemia    Diabetes (HCC)    type II   DVT (deep venous thrombosis) (HCC)    E-coli UTI    ESRD on hemodialysis (HCC)    MWF at [facility]   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    Gout 06/05/2018   History of blood transfusion    History of claustrophobia    HLD (hyperlipidemia) 06/05/2018   Hypertension    PE (pulmonary embolism)    Pulled muscle    pt has right sided facial droop from pulled muscle in face since birth   Retinopathy    Was going blind and had surgery with improvement    Patient Active Problem List   Diagnosis Date Noted   Complete heart block s/p PPM 04/12/2023   History of venous thromboembolism 04/12/2023   Paroxysmal atrial fibrillation (HCC) 04/12/2023   Hypokalemia 04/12/2023   Epistaxis 04/11/2023   Hypotension 04/11/2023   Debility 03/24/2023   Pressure injury of skin 03/24/2023   S/P MVR (mitral valve repair) 03/24/2023   Mitral valve vegetation    Acute pulmonary edema (HCC) 12/09/2020   Anemia, chronic disease 12/09/2020   Nausea vomiting and diarrhea 12/09/2020   ESRD on dialysis (HCC) 12/09/2020   Acute on chronic diastolic CHF (congestive heart failure) (HCC) 12/09/2020   Adenomatous polyp of ascending colon    Adenomatous polyp of descending colon    Cecal  polyp    Colon cancer screening    Grade I hemorrhoids    Melanosis coli    Polyp of sigmoid colon    Rectal polyp    Acute on chronic kidney failure (HCC) 12/31/2018   Uremia 12/31/2018   Hyperkalemia, diminished renal excretion 12/31/2018   Metabolic acidosis, NAG, failure of bicarbonate regeneration 12/31/2018   Long term (current) use of anticoagulants 09/07/2018   DVT (deep venous thrombosis) (HCC) 08/24/2018   Blindness of left eye 08/24/2018   Acute kidney injury superimposed on CKD (HCC) 06/06/2018   Left knee pain 06/06/2018   Anemia due to chronic kidney disease 06/06/2018   Hyponatremia 06/06/2018   HLD (hyperlipidemia) 06/05/2018   Hyperglycemia 06/05/2018   Gout 06/05/2018   Swelling of lower extremity 05/28/2018   CKD (chronic kidney disease) stage 5, GFR less than 15 ml/min (HCC) 05/28/2018   Hypochromic microcytic anemia 05/28/2018   Lower extremity edema 05/28/2018   Callosity 12/11/2015   Paronychia of right middle finger 10/10/2015   Coma of unknown cause (HCC) 10/01/2015   Facial palsy as birth trauma 10/01/2015   Essential hypertension 10/06/2013   Pulmonary embolism (HCC) 10/06/2013   Congestive dilated cardiomyopathy (HCC) 10/05/2013  Carotid bruit 10/05/2013   DM (diabetes mellitus), type 2, uncontrolled 10/04/2013   Type 2 diabetes mellitus with hyperglycemia, with long-term current use of insulin (HCC) 10/04/2013   Acute renal insufficiency 09/27/2013   Septic shock(785.52) 09/25/2013   Bacteremia 09/25/2013    Past Surgical History:  Procedure Laterality Date   A/V FISTULAGRAM Left 07/29/2019   Procedure: A/V FISTULAGRAM;  Surgeon: Sherren Kerns, MD;  Location: [facility];  Service: Cardiovascular;  Laterality: Left;   ARTERY EXPLORATION Left 06/14/2019   Procedure: Artery Exploration Left Upper Arm;  Surgeon: Sherren Kerns, MD;  Location: [facility] OR;  Service: Vascular;  Laterality: Left;   AV FISTULA PLACEMENT Left 12/31/2018    Procedure: ARTERIOVENOUS (AV) FISTULA CREATION VERSUS INSERTION OF ARTERIOVENOUS GRAFT LEFT ARM;  Surgeon: Sherren Kerns, MD;  Location: [facility] OR;  Service: Vascular;  Laterality: Left;   BASCILIC VEIN TRANSPOSITION Left 06/14/2019   Procedure: SECOND STAGE BASILIC VEIN TRANSPOSITION LEFT ARM;  Surgeon: Sherren Kerns, MD;  Location: [facility] OR;  Service: Vascular;  Laterality: Left;   BIOPSY  03/05/2020   Procedure: BIOPSY;  Surgeon: Shellia Cleverly, DO;  Location: [facility] ENDOSCOPY;  Service: Gastroenterology;;   COLONOSCOPY WITH PROPOFOL N/A 03/05/2020   Procedure: COLONOSCOPY WITH PROPOFOL;  Surgeon: Shellia Cleverly, DO;  Location: [facility] ENDOSCOPY;  Service: Gastroenterology;  Laterality: N/A;   EYE SURGERY     9 total eye surgery including laser and cararact surgery   HEMOSTASIS CLIP PLACEMENT  03/05/2020   Procedure: HEMOSTASIS CLIP PLACEMENT;  Surgeon: Shellia Cleverly, DO;  Location: [facility]OSCOPY;  Service: Gastroenterology;;   INSERTION OF DIALYSIS CATHETER N/A 12/31/2018   Procedure: INSERTION OF TUNNELED  DIALYSIS CATHETER;  Surgeon: Sherren Kerns, MD;  Location[facility] OR;  Service: Vascular;  Laterality: N/A;   PERIPHERAL VASCULAR BALLOON ANGIOPLASTY Left 07/29/2019   Procedure: PERIPHERAL VASCULAR BALLOON ANGIOPLASTY;  Surgeon: Sherren Kerns, MD;  Location: [facility];  Service: Cardiovascular;  Laterality: Left;  ARM FISTULA   POLYPECTOMY  03/05/2020   Procedure: POLYPECTOMY;  Surgeon: Shellia Cleverly, DO;  Location: [facility];  Service: Gastroenterology;;   TEE WITHOUT CARDIOVERSION N/A 12/12/2020   Procedure: TRANSESOPHAGEAL ECHOCARDIOGRAM (TEE);  Surgeon: Wendall Stade, MD;  Location: [facility] ENDOSCOPY;  Service: Cardiovascular;  Laterality: N/A;    OB History   No obstetric history on file.      Home Medications    Prior to Admission medications   Medication Sig Start Date End Date Taking? Authorizing Provider  amiodarone (PACERONE) 200 MG tablet Take 1 tablet  (200 mg total) by mouth daily. 04/08/23   Love, Evlyn Kanner, PA-C  aspirin 81 MG chewable tablet Chew 1 tablet (81 mg total) by mouth daily. 04/08/23   Love, Evlyn Kanner, PA-C  atomoxetine (STRATTERA) 18 MG capsule Take 1 capsule (18 mg total) by mouth daily. 04/09/23   Love, Evlyn Kanner, PA-C  multivitamin (RENA-VIT) TABS tablet Take 1 tablet by mouth at bedtime. 04/08/23   Jacquelynn Cree, PA-C  cinacalcet (SENSIPAR) 30 MG tablet Take 60 mg by mouth every Monday, Wednesday, and Friday.    [provider]  famotidine (PEPCID) 10 MG tablet Take 1 tablet (10 mg total) by mouth daily. 04/08/23   Love, Evlyn Kanner, PA-C  Insulin Glargine (BASAGLAR KWIKPEN) 100 UNIT/ML Inject 8 Units into the skin daily. Pen expires 28 days after first use 04/08/23   Love, Evlyn Kanner, PA-C  Insulin Pen Needle 32G X 4 MM  MISC 1 Application by Does not apply route at bedtime. 04/09/23   Love, Evlyn Kanner, PA-C  leptospermum manuka honey (MEDIHONEY) PSTE paste Apply 1 Application topically daily. To left elbow wound and cover with dry dressing. 04/09/23   Love, Evlyn Kanner, PA-C  melatonin 5 MG TABS Take 2 tablets (10 mg total) by mouth at bedtime. 04/08/23   Love, Evlyn Kanner, PA-C  midodrine (PROAMATINE) 10 MG tablet Take 2 tablets (20 mg total) by mouth every 8 (eight) hours. New medication. On discharge orders from [facility] 03/24/23. 04/08/23   Love, Evlyn Kanner, PA-C  polycarbophil (FIBERCON) 625 MG tablet Take 1 tablet (625 mg total) by mouth daily. 04/09/23   Love, Evlyn Kanner, PA-C  simethicone (MYLICON) 80 MG chewable tablet Chew 1 tablet (80 mg total) by mouth 4 (four) times daily. 04/08/23   Love, Evlyn Kanner, PA-C  sodium chloride (OCEAN) 0.65 % SOLN nasal spray Place 1 spray into both nostrils as needed for congestion. 04/08/23   Love, Evlyn Kanner, PA-C  Vitamin D, Ergocalciferol, (DRISDOL) 1.25 MG (50000 UNIT) CAPS capsule Take 1 capsule (50,000 Units total) by mouth every 7 (seven) days. 04/15/23   Love, Evlyn Kanner, PA-C  warfarin (COUMADIN) 4 MG tablet Take with  supper--take 1/2 tablet on Mon,Wed and Friday. Take whole tablet on Tue, Thus, Sat, Sun. Patient taking differently: Take 2-4 mg by mouth See admin instructions. Take with supper--take 2mg  by mouth on Mon,Wed and Friday. Take 4mg  by mouth on Tue, Thus, Sat, Sun. 04/09/23   Love, Evlyn Kanner, PA-C    Family History Family History  Problem Relation Age of Onset   Diabetes Father    Diabetes Maternal Aunt    CAD Neg Hx    Colon cancer Neg Hx    Esophageal cancer Neg Hx    Cancer Neg Hx     Social History Social History   Tobacco Use   Smoking status: Never   Smokeless tobacco: Never  Vaping Use   Vaping Use: Never used  Substance Use Topics   Alcohol use: Never   Drug use: Never     Allergies   Dust mite extract, Farxiga [dapagliflozin], Gramineae pollens, Hydrocodone, and Kombiglyze xr [saxagliptin-metformin er]   Review of Systems Review of Systems   Physical Exam Triage Vital Signs ED Triage Vitals  Enc Vitals Group     BP 04/19/23 1042 119/69     Pulse Rate 04/19/23 1042 94     Resp 04/19/23 1042 18     Temp 04/19/23 1042 98 F (36.7 C)     Temp Source 04/19/23 1042 Oral     SpO2 04/19/23 1042 98 %     Weight --      Height --      Head Circumference --      Peak Flow --      Pain Score 04/19/23 1046 0     Pain Loc --      Pain Edu? --      Excl. in GC? --    No data found.  Updated Vital Signs BP 119/69 (BP Location: Right Arm)   Pulse 94   Temp 98 F (36.7 C) (Oral)   Resp 18   LMP 12/08/2013   SpO2 98%   Visual Acuity Right Eye Distance:   Left Eye Distance:   Bilateral Distance:    Right Eye Near:   Left Eye Near:    Bilateral Near:     Physical Exam Vitals reviewed.  Constitutional:  General: She is not in acute distress.    Appearance: She is not ill-appearing, toxic-appearing or diaphoretic.  HENT:     Ears:     Comments: There are 2 spots of dried blood, each about 2 mm in diameter, on her pinna on the left.  There is a  clot of blood evident in the ear canal near the introitus.  There is no active bleeding that I can see.  The view of the tympanic membrane is obscured. Eyes:     Extraocular Movements: Extraocular movements intact.     Pupils: Pupils are equal, round, and reactive to light.  Skin:    Coloration: Skin is not pale.  Neurological:     Mental Status: She is alert and oriented to person, place, and time.  Psychiatric:        Behavior: Behavior normal.      UC Treatments / Results  Labs (all labs ordered are listed, but only abnormal results are displayed) Labs Reviewed - No data to display  EKG   Radiology No results found.  Procedures Procedures (including critical care time)  Medications Ordered in UC Medications - No data to display  Initial Impression / Assessment and Plan / UC Course  I have reviewed the triage vital signs and the nursing notes.  Pertinent labs & imaging results that were available during my care of the patient were reviewed by me and considered in my medical decision making (see chart for details).        I discussed with her and her family, that I do not think it is in her best interest for me to try to dislodge the clot, as I suspect it would start the possible abrasion to bleeding again.  We did discuss for her not to clean the ears with Q-tips anymore.  In a day or 2 she can begin putting a drop or 2 of hydroperoxide in her left ear to try to dissolve the clot.  She will let her primary care know this has happened Final Clinical Impressions(s) / UC Diagnoses   Final diagnoses:  Otorrhagia of left ear     Discharge Instructions      Patient will apply a dropper to of hydroperoxide once daily to try to slowly dissolve the blood clot.       ED Prescriptions   None    PDMP not reviewed this encounter.   Zenia Resides, MD 04/19/23 1104

## 2023-04-19 NOTE — ED Triage Notes (Signed)
Pt with nosebleed x 1 hr, no injury

## 2023-04-26 ENCOUNTER — Emergency Department (HOSPITAL_COMMUNITY)
Admission: EM | Admit: 2023-04-26 | Discharge: 2023-05-09 | Disposition: E | Payer: Medicare Other | Attending: Emergency Medicine | Admitting: Emergency Medicine

## 2023-04-26 ENCOUNTER — Emergency Department (HOSPITAL_COMMUNITY): Payer: Medicare Other

## 2023-04-26 ENCOUNTER — Encounter (HOSPITAL_COMMUNITY): Payer: Self-pay

## 2023-04-26 ENCOUNTER — Other Ambulatory Visit: Payer: Self-pay

## 2023-04-26 DIAGNOSIS — Z7901 Long term (current) use of anticoagulants: Secondary | ICD-10-CM | POA: Insufficient documentation

## 2023-04-26 DIAGNOSIS — I509 Heart failure, unspecified: Secondary | ICD-10-CM | POA: Diagnosis not present

## 2023-04-26 DIAGNOSIS — Z992 Dependence on renal dialysis: Secondary | ICD-10-CM | POA: Insufficient documentation

## 2023-04-26 DIAGNOSIS — Z86718 Personal history of other venous thrombosis and embolism: Secondary | ICD-10-CM | POA: Insufficient documentation

## 2023-04-26 DIAGNOSIS — Z7982 Long term (current) use of aspirin: Secondary | ICD-10-CM | POA: Diagnosis not present

## 2023-04-26 DIAGNOSIS — N186 End stage renal disease: Secondary | ICD-10-CM | POA: Insufficient documentation

## 2023-04-26 DIAGNOSIS — E1122 Type 2 diabetes mellitus with diabetic chronic kidney disease: Secondary | ICD-10-CM | POA: Insufficient documentation

## 2023-04-26 DIAGNOSIS — I132 Hypertensive heart and chronic kidney disease with heart failure and with stage 5 chronic kidney disease, or end stage renal disease: Secondary | ICD-10-CM | POA: Diagnosis not present

## 2023-04-26 DIAGNOSIS — Z794 Long term (current) use of insulin: Secondary | ICD-10-CM | POA: Diagnosis not present

## 2023-04-26 DIAGNOSIS — I469 Cardiac arrest, cause unspecified: Secondary | ICD-10-CM | POA: Insufficient documentation

## 2023-04-26 DIAGNOSIS — Z86711 Personal history of pulmonary embolism: Secondary | ICD-10-CM | POA: Diagnosis not present

## 2023-04-26 LAB — I-STAT CHEM 8, ED
BUN: 71 mg/dL — ABNORMAL HIGH (ref 6–20)
Calcium, Ion: 1.24 mmol/L (ref 1.15–1.40)
Chloride: 105 mmol/L (ref 98–111)
Creatinine, Ser: 5.8 mg/dL — ABNORMAL HIGH (ref 0.44–1.00)
Glucose, Bld: 411 mg/dL — ABNORMAL HIGH (ref 70–99)
HCT: 42 % (ref 36.0–46.0)
Hemoglobin: 14.3 g/dL (ref 12.0–15.0)
Potassium: 5.2 mmol/L — ABNORMAL HIGH (ref 3.5–5.1)
Sodium: 136 mmol/L (ref 135–145)
TCO2: 19 mmol/L — ABNORMAL LOW (ref 22–32)

## 2023-04-26 LAB — CBC
HCT: 40.9 % (ref 36.0–46.0)
Hemoglobin: 11.5 g/dL — ABNORMAL LOW (ref 12.0–15.0)
MCH: 26 pg (ref 26.0–34.0)
MCHC: 28.1 g/dL — ABNORMAL LOW (ref 30.0–36.0)
MCV: 92.3 fL (ref 80.0–100.0)
Platelets: 218 10*3/uL (ref 150–400)
RBC: 4.43 MIL/uL (ref 3.87–5.11)
RDW: 17.7 % — ABNORMAL HIGH (ref 11.5–15.5)
WBC: 9.2 10*3/uL (ref 4.0–10.5)
nRBC: 2.3 % — ABNORMAL HIGH (ref 0.0–0.2)

## 2023-04-26 LAB — COMPREHENSIVE METABOLIC PANEL
ALT: 62 U/L — ABNORMAL HIGH (ref 0–44)
AST: 175 U/L — ABNORMAL HIGH (ref 15–41)
Albumin: 2.3 g/dL — ABNORMAL LOW (ref 3.5–5.0)
Alkaline Phosphatase: 89 U/L (ref 38–126)
Anion gap: 27 — ABNORMAL HIGH (ref 5–15)
BUN: 58 mg/dL — ABNORMAL HIGH (ref 6–20)
CO2: 12 mmol/L — ABNORMAL LOW (ref 22–32)
Calcium: 10.8 mg/dL — ABNORMAL HIGH (ref 8.9–10.3)
Chloride: 97 mmol/L — ABNORMAL LOW (ref 98–111)
Creatinine, Ser: 6.35 mg/dL — ABNORMAL HIGH (ref 0.44–1.00)
GFR, Estimated: 7 mL/min — ABNORMAL LOW (ref >60)
Glucose, Bld: 425 mg/dL — ABNORMAL HIGH (ref 70–99)
Potassium: 5.1 mmol/L (ref 3.5–5.1)
Sodium: 136 mmol/L (ref 135–145)
Total Bilirubin: 1.4 mg/dL — ABNORMAL HIGH (ref 0.3–1.2)
Total Protein: 7.2 g/dL (ref 6.5–8.1)

## 2023-04-26 LAB — PHOSPHORUS: Phosphorus: 8.8 mg/dL — ABNORMAL HIGH (ref 2.5–4.6)

## 2023-04-26 LAB — TROPONIN I (HIGH SENSITIVITY): Troponin I (High Sensitivity): 95 ng/L — ABNORMAL HIGH (ref <18)

## 2023-04-26 LAB — PROCALCITONIN: Procalcitonin: UNDETERMINED ng/mL

## 2023-04-26 LAB — MAGNESIUM: Magnesium: 2.8 mg/dL — ABNORMAL HIGH (ref 1.7–2.4)

## 2023-04-26 LAB — TRIGLYCERIDES: Triglycerides: 91 mg/dL (ref <150)

## 2023-05-09 NOTE — ED Notes (Signed)
Colleen [name], pts son [phone] and funeral home- [employer] [place], [place]

## 2023-05-09 NOTE — ED Notes (Signed)
Pt BIB EMS due to cardiac arrest. CPR started at 1359 by family, EMS got ROSC at 1409 and initiated CPR again art 1420. WItnessed arrest by family. Vtach and shocked once, 8 epis given, sodium bicarb, and calcium.

## 2023-05-09 NOTE — ED Notes (Signed)
1447- pulse check 1452- epi  1502- CPR 1505- EPI 1507- CPR  1508 time of death

## 2023-05-09 NOTE — ED Triage Notes (Signed)
Pt from home, came in as CPR in progress. Recent pacemaker, open wound on chest due to compressions. Dialysis pt

## 2023-05-09 NOTE — ED Notes (Signed)
Mom, son, and friend in consult room and notified of death. Pt cleaned and family at bedside

## 2023-05-09 NOTE — Progress Notes (Signed)
    1530  Spiritual Encounters  Type of Visit Initial  Care provided to: [facility] partners present during encounter Nurse  Referral source Nurse (RN/NT/LPN)  Reason for visit Patient death   [facility] received page from ED requesting support for family gathered in consult room in the wake of pt.'s death.  CH met two of pt.'s brothers and her mother and friend in consult room; they shared that pt. had recently undergone surgery to repair a heart valve.  Surgery went well, they shared, but pt. had several other significant health issues; today pt. went into cardiac arrest at home and her friend began CPR which was continued by EMTs when they arrived.  CH escorted family to see pt. and got contact info from brother Josie Saunders.

## 2023-05-09 NOTE — ED Provider Notes (Signed)
[place] EMERGENCY DEPARTMENT AT [facility] Provider Note   CSN: [id number] Arrival date & time:   1444     History  No chief complaint on file.   Colleen [name] is a 62 y.o. female.  Pt is a 62 yo female with pmhx significant for ESRD on HD MVWF, DVT and PE on coumadin, HTN, DM, congenital right facial palsy, MVR, CHF, and CHB s/p pacer.  Pt had a witnessed arrest around 1400.  Family did do CPR.  When EMS arrived, she was shocked once, intubated with an I-gel, given multiple rounds of epi.  She was also given calcium and bicarb for possible hyperkalemia.  Pt did have brief ROSC, but CPR was done for nearly 1 hour prior to arrival here.       Home Medications Prior to Admission medications   Medication Sig Start Date End Date Taking? Authorizing Provider  amiodarone (PACERONE) 200 MG tablet Take 1 tablet (200 mg total) by mouth daily. 04/08/23   Love, Evlyn Kanner, PA-C  aspirin 81 MG chewable tablet Chew 1 tablet (81 mg total) by mouth daily. 04/08/23   Love, Evlyn Kanner, PA-C  atomoxetine (STRATTERA) 18 MG capsule Take 1 capsule (18 mg total) by mouth daily. 04/09/23   Love, Evlyn Kanner, PA-C  multivitamin (RENA-VIT) TABS tablet Take 1 tablet by mouth at bedtime. 04/08/23   Jacquelynn Cree, PA-C  cinacalcet (SENSIPAR) 30 MG tablet Take 60 mg by mouth every Monday, Wednesday, and Friday.    [provider]  famotidine (PEPCID) 10 MG tablet Take 1 tablet (10 mg total) by mouth daily. 04/08/23   Love, Evlyn Kanner, PA-C  Insulin Glargine (BASAGLAR KWIKPEN) 100 UNIT/ML Inject 8 Units into the skin daily. Pen expires 28 days after first use 04/08/23   Love, Evlyn Kanner, PA-C  Insulin Pen Needle 32G X 4 MM MISC 1 Application by Does not apply route at bedtime. 04/09/23   Love, Evlyn Kanner, PA-C  leptospermum manuka honey (MEDIHONEY) PSTE paste Apply 1 Application topically daily. To left elbow wound and cover with dry dressing. 04/09/23   Love, Evlyn Kanner, PA-C  melatonin 5 MG TABS Take 2  tablets (10 mg total) by mouth at bedtime. 04/08/23   Love, Evlyn Kanner, PA-C  midodrine (PROAMATINE) 10 MG tablet Take 2 tablets (20 mg total) by mouth every 8 (eight) hours. New medication. On discharge orders from [facility] 03/24/23. 04/08/23   Love, Evlyn Kanner, PA-C  polycarbophil (FIBERCON) 625 MG tablet Take 1 tablet (625 mg total) by mouth daily. 04/09/23   Love, Evlyn Kanner, PA-C  simethicone (MYLICON) 80 MG chewable tablet Chew 1 tablet (80 mg total) by mouth 4 (four) times daily. 04/08/23   Love, Evlyn Kanner, PA-C  sodium chloride (OCEAN) 0.65 % SOLN nasal spray Place 1 spray into both nostrils as needed for congestion. 04/08/23   Love, Evlyn Kanner, PA-C  Vitamin D, Ergocalciferol, (DRISDOL) 1.25 MG (50000 UNIT) CAPS capsule Take 1 capsule (50,000 Units total) by mouth every 7 (seven) days. 04/15/23   Love, Evlyn Kanner, PA-C  warfarin (COUMADIN) 4 MG tablet Take with supper--take 1/2 tablet on Mon,Wed and Friday. Take whole tablet on Tue, Thus, Sat, Sun. Patient taking differently: Take 2-4 mg by mouth See admin instructions. Take with supper--take 2mg  by mouth on Mon,Wed and Friday. Take 4mg  by mouth on Tue, Thus, Sat, Sun. 04/09/23   Love, Evlyn Kanner, PA-C      Allergies    Dust mite extract, Marcelline Deist [dapagliflozin],  Gramineae pollens, Hydrocodone, and Kombiglyze xr [saxagliptin-metformin er]    Review of Systems   Review of Systems  Unable to perform ROS: Intubated    Physical Exam Updated Vital Signs LMP 12/08/2013  Physical Exam Vitals and nursing note reviewed.  Constitutional:      General: She is in acute distress.     Appearance: She is toxic-appearing.     Interventions: She is intubated.  HENT:     Head: Normocephalic.     Right Ear: External ear normal.     Left Ear: External ear normal.     Nose: Nose normal.     Mouth/Throat:     Mouth: Mucous membranes are dry.  Eyes:     Comments: Pupils fixed; no corneal reflex  Cardiovascular:     Rate and Rhythm: Normal rate and regular rhythm.      Pulses: Normal pulses.     Heart sounds: Normal heart sounds.  Pulmonary:     Effort: She is intubated.     Comments: No spontaneous bs Chest:     Comments: Dehiscence to chest wall from recent midline incision.  See picture. Abdominal:     General: Abdomen is flat. Bowel sounds are normal.     Palpations: Abdomen is soft.  Musculoskeletal:        General: No deformity.     Cervical back: Normal range of motion and neck supple.  Skin:    General: Skin is warm.     Capillary Refill: Capillary refill takes 2 to 3 seconds.  Neurological:     Mental Status: She is unresponsive.  Psychiatric:     Comments: Unable to assess     ED Results / Procedures / Treatments   Labs (all labs ordered are listed, but only abnormal results are displayed) Labs Reviewed  CBC - Abnormal; Notable for the following components:      Result Value   Hemoglobin 11.5 (*)    MCHC 28.1 (*)    RDW 17.7 (*)    nRBC 2.3 (*)    All other components within normal limits  I-STAT CHEM 8, ED - Abnormal; Notable for the following components:   Potassium 5.2 (*)    BUN 71 (*)    Creatinine, Ser 5.80 (*)    Glucose, Bld 411 (*)    TCO2 19 (*)    All other components within normal limits  BLOOD GAS, ARTERIAL  COMPREHENSIVE METABOLIC PANEL  LACTIC ACID, PLASMA  LACTIC ACID, PLASMA  LACTIC ACID, PLASMA  LACTIC ACID, PLASMA  MAGNESIUM  PHOSPHORUS  RAPID URINE DRUG SCREEN, HOSP PERFORMED  PROCALCITONIN  TRIGLYCERIDES  URINALYSIS, W/ REFLEX TO CULTURE (INFECTION SUSPECTED)  PROTIME-INR  APTT  TROPONIN I (HIGH SENSITIVITY)    EKG EKG Interpretation  Date/Time:  Sunday Apr 26 2023 14:55:59 EDT Ventricular Rate:  88 PR Interval:    QRS Duration: 116 QT Interval:  401 QTC Calculation: 463 R Axis:   147 Text Interpretation: Atrial fibrillation Paired ventricular premature complexes IRBBB and LPFB Abnormal lateral Q waves Probable anteroseptal infarct, recent VENTRICULAR PACED RHYTHM Confirmed by  Jacalyn Lefevre [phone]) on  3:02:00 PM  Radiology No results found.  Procedures Date/Time:  3:35 PM  Performed by: Jacalyn Lefevre, MDLaryngoscope Size: Glidescope and 3 Tube size: 7.5 mm Number of attempts: 1 Comments: Pt did not require meds for intubation        Medications Ordered in ED Medications - No data to display  ED Course/ Medical Decision Making/ A&P  Medical Decision Making Amount and/or Complexity of Data Reviewed Labs: ordered. Radiology: ordered.   This patient presents to the ED for concern of cardiac arrest, this involves an extensive number of treatment options, and is a complaint that carries with it a high risk of complications and morbidity.  The differential diagnosis includes mi, pe, hyperkalemia, complete heart block,    Co morbidities that complicate the patient evaluation  ESRD on HD MVWF, DVT and PE on coumadin, HTN, DM, congenital right facial palsy, MVR, CHF, and CHB s/p pacer.   Additional history obtained:  Additional history obtained from epic chart review External records from outside source obtained and reviewed including EMS report   Lab Tests:  I Ordered, and personally interpreted labs.  The pertinent results include:  cbc nl, istat with k 5.2; other labs pending   Imaging Studies ordered:  I ordered imaging studies including cxr, but pt  coded again and could not be done  Cardiac Monitoring:  The patient was maintained on a cardiac monitor.  I personally viewed and interpreted the cardiac monitored which showed an underlying rhythm of: paced   Medicines ordered and prescription drug management:  I ordered medication including epi  for asystole  Reevaluation of the patient after these medicines showed that the patient stayed the same I have reviewed the patients home medicines and have made adjustments as needed   Critical Interventions:  CPR   Problem List / ED  Course:  Full arrest:  Pt did have brief ROSC here.  We gave additional epi and CPR started again.  As pt had fixed pupils and did not required drugs for intubation and she kept coding.  I spoke with my colleague, Dr. Anitra Lauth.  She and I agreed that additional CPR would not be helpful for any meaningful recovery. I did call pt's doctor's office and let them know pt has passed away.  Pt has multiple medical problems, so this is not a ME case.  TOD 1508.   Reevaluation:  After the interventions noted above, I reevaluated the patient and found that they have :worsened   Social Determinants of Health:  Lives at home   Dispostion:  Morgue  CRITICAL CARE Performed by: Jacalyn Lefevre   Total critical care time: 30 minutes  Critical care time was exclusive of separately billable procedures and treating other patients.  Critical care was necessary to treat or prevent imminent or life-threatening deterioration.  Critical care was time spent personally by me on the following activities: development of treatment plan with patient and/or surrogate as well as nursing, discussions with consultants, evaluation of patient's response to treatment, examination of patient, obtaining history from patient or surrogate, ordering and performing treatments and interventions, ordering and review of laboratory studies, ordering and review of radiographic studies, pulse oximetry and re-evaluation of patient's condition.         Final Clinical Impression(s) / ED Diagnoses Final diagnoses:  Cardiopulmonary arrest [facility])    Rx / DC Orders ED Discharge Orders     None         Jacalyn Lefevre, MD  1541

## 2023-05-09 DEATH — deceased

## 2023-05-22 ENCOUNTER — Inpatient Hospital Stay: Payer: Federal, State, Local not specified - PPO | Admitting: Physical Medicine and Rehabilitation
